# Patient Record
Sex: Female | Born: 1935 | Race: Black or African American | Hispanic: No | Smoking: Never smoker
Health system: Southern US, Community
[De-identification: ages and names within clinical notes are randomized; demographics above are authoritative.]

## PROBLEM LIST (undated history)

## (undated) DIAGNOSIS — D472 Monoclonal gammopathy: Secondary | ICD-10-CM

## (undated) DIAGNOSIS — E079 Disorder of thyroid, unspecified: Secondary | ICD-10-CM

## (undated) DIAGNOSIS — I639 Cerebral infarction, unspecified: Secondary | ICD-10-CM

## (undated) DIAGNOSIS — IMO0001 Reserved for inherently not codable concepts without codable children: Secondary | ICD-10-CM

## (undated) DIAGNOSIS — M869 Osteomyelitis, unspecified: Secondary | ICD-10-CM

## (undated) DIAGNOSIS — Z5189 Encounter for other specified aftercare: Secondary | ICD-10-CM

## (undated) DIAGNOSIS — I872 Venous insufficiency (chronic) (peripheral): Secondary | ICD-10-CM

## (undated) DIAGNOSIS — D649 Anemia, unspecified: Secondary | ICD-10-CM

## (undated) DIAGNOSIS — E039 Hypothyroidism, unspecified: Secondary | ICD-10-CM

## (undated) DIAGNOSIS — E785 Hyperlipidemia, unspecified: Secondary | ICD-10-CM

## (undated) DIAGNOSIS — I10 Essential (primary) hypertension: Secondary | ICD-10-CM

## (undated) DIAGNOSIS — I4891 Unspecified atrial fibrillation: Secondary | ICD-10-CM

## (undated) DIAGNOSIS — N189 Chronic kidney disease, unspecified: Secondary | ICD-10-CM

## (undated) HISTORY — DX: Chronic kidney disease, unspecified: N18.9

## (undated) HISTORY — DX: Hyperlipidemia, unspecified: E78.5

## (undated) HISTORY — PX: EYE SURGERY: SHX253

## (undated) HISTORY — DX: Disorder of thyroid, unspecified: E07.9

## (undated) HISTORY — DX: Venous insufficiency (chronic) (peripheral): I87.2

## (undated) HISTORY — DX: Essential (primary) hypertension: I10

## (undated) HISTORY — PX: APPENDECTOMY: SHX54

## (undated) HISTORY — DX: Osteomyelitis, unspecified: M86.9

## (undated) HISTORY — DX: Unspecified atrial fibrillation: I48.91

## (undated) HISTORY — DX: Anemia, unspecified: D64.9

## (undated) HISTORY — DX: Cerebral infarction, unspecified: I63.9

---

## 1997-09-06 ENCOUNTER — Encounter: Admission: RE | Admit: 1997-09-06 | Discharge: 1997-09-06 | Payer: Self-pay | Admitting: Internal Medicine

## 1997-09-11 ENCOUNTER — Encounter: Admission: RE | Admit: 1997-09-11 | Discharge: 1997-09-11 | Payer: Self-pay | Admitting: Internal Medicine

## 1997-10-16 ENCOUNTER — Encounter: Admission: RE | Admit: 1997-10-16 | Discharge: 1997-10-16 | Payer: Self-pay | Admitting: Internal Medicine

## 1997-11-13 ENCOUNTER — Encounter: Admission: RE | Admit: 1997-11-13 | Discharge: 1997-11-13 | Payer: Self-pay | Admitting: Internal Medicine

## 1997-11-30 ENCOUNTER — Inpatient Hospital Stay (HOSPITAL_COMMUNITY): Admission: RE | Admit: 1997-11-30 | Discharge: 1997-12-01 | Payer: Self-pay | Admitting: Hematology and Oncology

## 1997-12-08 ENCOUNTER — Encounter: Admission: RE | Admit: 1997-12-08 | Discharge: 1997-12-08 | Payer: Self-pay | Admitting: Internal Medicine

## 1998-01-12 ENCOUNTER — Encounter: Admission: RE | Admit: 1998-01-12 | Discharge: 1998-01-12 | Payer: Self-pay | Admitting: Internal Medicine

## 1998-04-23 ENCOUNTER — Ambulatory Visit (HOSPITAL_COMMUNITY): Admission: RE | Admit: 1998-04-23 | Discharge: 1998-04-23 | Payer: Self-pay | Admitting: Internal Medicine

## 1998-04-23 ENCOUNTER — Encounter: Admission: RE | Admit: 1998-04-23 | Discharge: 1998-04-23 | Payer: Self-pay | Admitting: Internal Medicine

## 1998-04-26 ENCOUNTER — Encounter: Payer: Self-pay | Admitting: Hematology and Oncology

## 1998-04-26 ENCOUNTER — Encounter: Admission: RE | Admit: 1998-04-26 | Discharge: 1998-04-26 | Payer: Self-pay | Admitting: Hematology and Oncology

## 1998-04-26 ENCOUNTER — Inpatient Hospital Stay (HOSPITAL_COMMUNITY): Admission: AD | Admit: 1998-04-26 | Discharge: 1998-05-02 | Payer: Self-pay | Admitting: Hematology and Oncology

## 1998-04-28 ENCOUNTER — Encounter: Payer: Self-pay | Admitting: Internal Medicine

## 1998-05-01 ENCOUNTER — Encounter: Payer: Self-pay | Admitting: *Deleted

## 1998-05-07 ENCOUNTER — Encounter: Admission: RE | Admit: 1998-05-07 | Discharge: 1998-05-07 | Payer: Self-pay | Admitting: Internal Medicine

## 1998-05-14 ENCOUNTER — Encounter: Admission: RE | Admit: 1998-05-14 | Discharge: 1998-05-14 | Payer: Self-pay | Admitting: Internal Medicine

## 1998-05-21 ENCOUNTER — Encounter: Admission: RE | Admit: 1998-05-21 | Discharge: 1998-05-21 | Payer: Self-pay | Admitting: Internal Medicine

## 1998-05-30 ENCOUNTER — Encounter: Admission: RE | Admit: 1998-05-30 | Discharge: 1998-05-30 | Payer: Self-pay | Admitting: Internal Medicine

## 1998-07-12 ENCOUNTER — Ambulatory Visit (HOSPITAL_COMMUNITY): Admission: RE | Admit: 1998-07-12 | Discharge: 1998-07-12 | Payer: Self-pay | Admitting: *Deleted

## 1998-07-24 ENCOUNTER — Encounter: Admission: RE | Admit: 1998-07-24 | Discharge: 1998-07-24 | Payer: Self-pay | Admitting: Hematology and Oncology

## 1998-09-25 ENCOUNTER — Encounter: Admission: RE | Admit: 1998-09-25 | Discharge: 1998-09-25 | Payer: Self-pay | Admitting: *Deleted

## 1998-10-26 ENCOUNTER — Encounter: Admission: RE | Admit: 1998-10-26 | Discharge: 1998-10-26 | Payer: Self-pay | Admitting: Internal Medicine

## 1998-10-30 ENCOUNTER — Encounter: Admission: RE | Admit: 1998-10-30 | Discharge: 1998-10-30 | Payer: Self-pay | Admitting: Hematology and Oncology

## 1998-11-09 ENCOUNTER — Ambulatory Visit (HOSPITAL_COMMUNITY): Admission: RE | Admit: 1998-11-09 | Discharge: 1998-11-09 | Payer: Self-pay | Admitting: Internal Medicine

## 1998-11-09 ENCOUNTER — Encounter: Admission: RE | Admit: 1998-11-09 | Discharge: 1998-11-09 | Payer: Self-pay | Admitting: Internal Medicine

## 1998-11-12 ENCOUNTER — Encounter: Admission: RE | Admit: 1998-11-12 | Discharge: 1998-11-12 | Payer: Self-pay | Admitting: Internal Medicine

## 1998-11-13 ENCOUNTER — Encounter: Admission: RE | Admit: 1998-11-13 | Discharge: 1999-02-11 | Payer: Self-pay | Admitting: Internal Medicine

## 1998-12-12 ENCOUNTER — Encounter: Payer: Self-pay | Admitting: Orthopedic Surgery

## 1998-12-19 ENCOUNTER — Encounter (INDEPENDENT_AMBULATORY_CARE_PROVIDER_SITE_OTHER): Payer: Self-pay | Admitting: Specialist

## 1998-12-19 ENCOUNTER — Other Ambulatory Visit: Admission: RE | Admit: 1998-12-19 | Discharge: 1998-12-19 | Payer: Self-pay | Admitting: Gastroenterology

## 1998-12-28 ENCOUNTER — Ambulatory Visit (HOSPITAL_COMMUNITY): Admission: RE | Admit: 1998-12-28 | Discharge: 1998-12-28 | Payer: Self-pay | Admitting: Orthopedic Surgery

## 1998-12-28 ENCOUNTER — Encounter: Payer: Self-pay | Admitting: Orthopedic Surgery

## 1999-01-17 ENCOUNTER — Encounter: Admission: RE | Admit: 1999-01-17 | Discharge: 1999-01-17 | Payer: Self-pay | Admitting: Internal Medicine

## 1999-01-24 ENCOUNTER — Encounter: Payer: Self-pay | Admitting: Orthopedic Surgery

## 1999-01-29 ENCOUNTER — Encounter (INDEPENDENT_AMBULATORY_CARE_PROVIDER_SITE_OTHER): Payer: Self-pay | Admitting: Specialist

## 1999-01-29 ENCOUNTER — Observation Stay (HOSPITAL_COMMUNITY): Admission: RE | Admit: 1999-01-29 | Discharge: 1999-01-30 | Payer: Self-pay | Admitting: Orthopedic Surgery

## 1999-02-19 ENCOUNTER — Encounter: Admission: RE | Admit: 1999-02-19 | Discharge: 1999-05-20 | Payer: Self-pay | Admitting: Orthopedic Surgery

## 1999-03-18 ENCOUNTER — Encounter: Admission: RE | Admit: 1999-03-18 | Discharge: 1999-03-18 | Payer: Self-pay | Admitting: Internal Medicine

## 1999-05-03 ENCOUNTER — Encounter: Admission: RE | Admit: 1999-05-03 | Discharge: 1999-05-03 | Payer: Self-pay | Admitting: Internal Medicine

## 1999-05-28 ENCOUNTER — Ambulatory Visit (HOSPITAL_COMMUNITY): Admission: RE | Admit: 1999-05-28 | Discharge: 1999-05-28 | Payer: Self-pay | Admitting: *Deleted

## 1999-06-17 ENCOUNTER — Encounter: Admission: RE | Admit: 1999-06-17 | Discharge: 1999-06-17 | Payer: Self-pay | Admitting: Internal Medicine

## 1999-07-22 ENCOUNTER — Encounter: Admission: RE | Admit: 1999-07-22 | Discharge: 1999-07-22 | Payer: Self-pay | Admitting: Internal Medicine

## 1999-07-29 ENCOUNTER — Encounter: Admission: RE | Admit: 1999-07-29 | Discharge: 1999-10-27 | Payer: Self-pay | Admitting: Internal Medicine

## 1999-11-11 ENCOUNTER — Encounter: Admission: RE | Admit: 1999-11-11 | Discharge: 2000-02-09 | Payer: Self-pay | Admitting: Orthopedic Surgery

## 1999-12-17 ENCOUNTER — Encounter: Payer: Self-pay | Admitting: Obstetrics

## 1999-12-17 ENCOUNTER — Ambulatory Visit (HOSPITAL_COMMUNITY): Admission: RE | Admit: 1999-12-17 | Discharge: 1999-12-17 | Payer: Self-pay | Admitting: Obstetrics

## 2000-03-09 ENCOUNTER — Encounter: Admission: RE | Admit: 2000-03-09 | Discharge: 2000-03-09 | Payer: Self-pay | Admitting: Internal Medicine

## 2000-08-20 ENCOUNTER — Encounter: Admission: RE | Admit: 2000-08-20 | Discharge: 2000-08-20 | Payer: Self-pay | Admitting: Internal Medicine

## 2000-08-26 ENCOUNTER — Encounter: Admission: RE | Admit: 2000-08-26 | Discharge: 2000-08-26 | Payer: Self-pay

## 2000-08-28 ENCOUNTER — Encounter: Admission: RE | Admit: 2000-08-28 | Discharge: 2000-08-28 | Payer: Self-pay

## 2000-09-01 ENCOUNTER — Encounter: Admission: RE | Admit: 2000-09-01 | Discharge: 2000-09-01 | Payer: Self-pay | Admitting: Internal Medicine

## 2000-10-07 ENCOUNTER — Encounter: Admission: RE | Admit: 2000-10-07 | Discharge: 2000-10-07 | Payer: Self-pay | Admitting: Internal Medicine

## 2000-10-13 ENCOUNTER — Encounter: Payer: Self-pay | Admitting: *Deleted

## 2000-10-13 ENCOUNTER — Encounter: Admission: RE | Admit: 2000-10-13 | Discharge: 2000-10-13 | Payer: Self-pay | Admitting: *Deleted

## 2001-01-26 ENCOUNTER — Encounter: Admission: RE | Admit: 2001-01-26 | Discharge: 2001-01-26 | Payer: Self-pay | Admitting: Internal Medicine

## 2001-02-26 ENCOUNTER — Encounter: Admission: RE | Admit: 2001-02-26 | Discharge: 2001-05-27 | Payer: Self-pay | Admitting: *Deleted

## 2001-04-20 ENCOUNTER — Encounter: Admission: RE | Admit: 2001-04-20 | Discharge: 2001-04-20 | Payer: Self-pay | Admitting: *Deleted

## 2001-05-06 ENCOUNTER — Encounter: Admission: RE | Admit: 2001-05-06 | Discharge: 2001-05-06 | Payer: Self-pay | Admitting: Internal Medicine

## 2001-05-17 ENCOUNTER — Encounter: Admission: RE | Admit: 2001-05-17 | Discharge: 2001-05-17 | Payer: Self-pay | Admitting: *Deleted

## 2001-06-02 ENCOUNTER — Ambulatory Visit (HOSPITAL_COMMUNITY): Admission: RE | Admit: 2001-06-02 | Discharge: 2001-06-02 | Payer: Self-pay | Admitting: *Deleted

## 2001-08-04 ENCOUNTER — Encounter: Admission: RE | Admit: 2001-08-04 | Discharge: 2001-08-04 | Payer: Self-pay | Admitting: Internal Medicine

## 2001-11-09 ENCOUNTER — Encounter: Admission: RE | Admit: 2001-11-09 | Discharge: 2001-11-09 | Payer: Self-pay | Admitting: Internal Medicine

## 2001-12-13 ENCOUNTER — Encounter: Admission: RE | Admit: 2001-12-13 | Discharge: 2001-12-13 | Payer: Self-pay | Admitting: Internal Medicine

## 2001-12-21 ENCOUNTER — Ambulatory Visit (HOSPITAL_COMMUNITY): Admission: RE | Admit: 2001-12-21 | Discharge: 2001-12-21 | Payer: Self-pay | Admitting: Internal Medicine

## 2002-03-08 ENCOUNTER — Encounter: Payer: Self-pay | Admitting: Internal Medicine

## 2002-03-08 ENCOUNTER — Encounter: Admission: RE | Admit: 2002-03-08 | Discharge: 2002-03-08 | Payer: Self-pay | Admitting: Internal Medicine

## 2002-03-08 ENCOUNTER — Ambulatory Visit (HOSPITAL_COMMUNITY): Admission: RE | Admit: 2002-03-08 | Discharge: 2002-03-08 | Payer: Self-pay | Admitting: Internal Medicine

## 2002-12-30 ENCOUNTER — Encounter: Admission: RE | Admit: 2002-12-30 | Discharge: 2002-12-30 | Payer: Self-pay | Admitting: Internal Medicine

## 2003-03-30 ENCOUNTER — Encounter: Admission: RE | Admit: 2003-03-30 | Discharge: 2003-03-30 | Payer: Self-pay | Admitting: Internal Medicine

## 2003-05-23 ENCOUNTER — Encounter: Admission: RE | Admit: 2003-05-23 | Discharge: 2003-05-23 | Payer: Self-pay | Admitting: Internal Medicine

## 2003-07-12 ENCOUNTER — Encounter: Admission: RE | Admit: 2003-07-12 | Discharge: 2003-07-12 | Payer: Self-pay | Admitting: Internal Medicine

## 2003-08-31 ENCOUNTER — Encounter: Admission: RE | Admit: 2003-08-31 | Discharge: 2003-08-31 | Payer: Self-pay | Admitting: Internal Medicine

## 2003-09-12 ENCOUNTER — Ambulatory Visit (HOSPITAL_COMMUNITY): Admission: RE | Admit: 2003-09-12 | Discharge: 2003-09-12 | Payer: Self-pay

## 2003-09-21 ENCOUNTER — Encounter: Admission: RE | Admit: 2003-09-21 | Discharge: 2003-09-21 | Payer: Self-pay | Admitting: Internal Medicine

## 2004-03-12 ENCOUNTER — Ambulatory Visit: Payer: Self-pay | Admitting: Internal Medicine

## 2004-03-19 ENCOUNTER — Ambulatory Visit: Payer: Self-pay | Admitting: Oncology

## 2004-04-18 ENCOUNTER — Ambulatory Visit: Payer: Self-pay | Admitting: Internal Medicine

## 2004-04-19 ENCOUNTER — Encounter (INDEPENDENT_AMBULATORY_CARE_PROVIDER_SITE_OTHER): Payer: Self-pay | Admitting: Specialist

## 2004-04-19 ENCOUNTER — Ambulatory Visit (HOSPITAL_COMMUNITY): Admission: RE | Admit: 2004-04-19 | Discharge: 2004-04-19 | Payer: Self-pay | Admitting: Oncology

## 2004-04-19 ENCOUNTER — Ambulatory Visit: Payer: Self-pay | Admitting: Oncology

## 2004-04-20 ENCOUNTER — Ambulatory Visit (HOSPITAL_COMMUNITY): Admission: RE | Admit: 2004-04-20 | Discharge: 2004-04-21 | Payer: Self-pay | Admitting: Oncology

## 2004-05-07 ENCOUNTER — Ambulatory Visit: Payer: Self-pay | Admitting: Oncology

## 2004-06-25 ENCOUNTER — Ambulatory Visit: Payer: Self-pay | Admitting: Oncology

## 2004-07-02 ENCOUNTER — Ambulatory Visit: Payer: Self-pay | Admitting: Internal Medicine

## 2004-08-16 ENCOUNTER — Ambulatory Visit: Payer: Self-pay | Admitting: Oncology

## 2004-09-24 ENCOUNTER — Ambulatory Visit: Payer: Self-pay | Admitting: Internal Medicine

## 2004-10-04 ENCOUNTER — Ambulatory Visit: Payer: Self-pay | Admitting: Oncology

## 2004-10-04 ENCOUNTER — Encounter (INDEPENDENT_AMBULATORY_CARE_PROVIDER_SITE_OTHER): Payer: Self-pay | Admitting: Internal Medicine

## 2004-10-16 ENCOUNTER — Ambulatory Visit: Payer: Self-pay | Admitting: Internal Medicine

## 2004-11-29 ENCOUNTER — Ambulatory Visit: Payer: Self-pay | Admitting: Oncology

## 2004-12-12 ENCOUNTER — Ambulatory Visit: Payer: Self-pay | Admitting: Internal Medicine

## 2004-12-12 ENCOUNTER — Inpatient Hospital Stay (HOSPITAL_COMMUNITY): Admission: AD | Admit: 2004-12-12 | Discharge: 2004-12-26 | Payer: Self-pay | Admitting: Internal Medicine

## 2004-12-12 ENCOUNTER — Ambulatory Visit: Payer: Self-pay | Admitting: Oncology

## 2004-12-13 ENCOUNTER — Ambulatory Visit: Payer: Self-pay | Admitting: Cardiology

## 2004-12-13 ENCOUNTER — Encounter: Payer: Self-pay | Admitting: Cardiology

## 2004-12-13 ENCOUNTER — Ambulatory Visit: Payer: Self-pay | Admitting: Internal Medicine

## 2004-12-19 ENCOUNTER — Ambulatory Visit: Payer: Self-pay | Admitting: Cardiology

## 2004-12-19 ENCOUNTER — Encounter: Payer: Self-pay | Admitting: Cardiology

## 2004-12-24 ENCOUNTER — Encounter (INDEPENDENT_AMBULATORY_CARE_PROVIDER_SITE_OTHER): Payer: Self-pay | Admitting: *Deleted

## 2004-12-25 ENCOUNTER — Encounter (INDEPENDENT_AMBULATORY_CARE_PROVIDER_SITE_OTHER): Payer: Self-pay | Admitting: *Deleted

## 2005-01-23 ENCOUNTER — Ambulatory Visit: Payer: Self-pay | Admitting: Oncology

## 2005-01-31 ENCOUNTER — Inpatient Hospital Stay (HOSPITAL_COMMUNITY): Admission: RE | Admit: 2005-01-31 | Discharge: 2005-02-07 | Payer: Self-pay | Admitting: Internal Medicine

## 2005-01-31 ENCOUNTER — Ambulatory Visit: Payer: Self-pay | Admitting: Internal Medicine

## 2005-02-01 ENCOUNTER — Ambulatory Visit: Payer: Self-pay | Admitting: Internal Medicine

## 2005-02-05 ENCOUNTER — Encounter (INDEPENDENT_AMBULATORY_CARE_PROVIDER_SITE_OTHER): Payer: Self-pay | Admitting: Specialist

## 2005-02-10 ENCOUNTER — Ambulatory Visit: Payer: Self-pay | Admitting: Internal Medicine

## 2005-02-12 ENCOUNTER — Encounter (HOSPITAL_COMMUNITY): Admission: RE | Admit: 2005-02-12 | Discharge: 2005-05-13 | Payer: Self-pay | Admitting: Internal Medicine

## 2005-02-13 ENCOUNTER — Ambulatory Visit: Payer: Self-pay | Admitting: Internal Medicine

## 2005-02-14 ENCOUNTER — Ambulatory Visit: Payer: Self-pay | Admitting: Internal Medicine

## 2005-02-20 ENCOUNTER — Ambulatory Visit: Payer: Self-pay | Admitting: Internal Medicine

## 2005-02-24 ENCOUNTER — Ambulatory Visit: Payer: Self-pay | Admitting: Hospitalist

## 2005-02-26 ENCOUNTER — Ambulatory Visit: Payer: Self-pay | Admitting: Internal Medicine

## 2005-03-20 ENCOUNTER — Ambulatory Visit: Payer: Self-pay | Admitting: Oncology

## 2005-03-24 ENCOUNTER — Ambulatory Visit: Payer: Self-pay | Admitting: Internal Medicine

## 2005-04-04 ENCOUNTER — Ambulatory Visit: Payer: Self-pay | Admitting: Internal Medicine

## 2005-04-14 ENCOUNTER — Inpatient Hospital Stay (HOSPITAL_COMMUNITY): Admission: AD | Admit: 2005-04-14 | Discharge: 2005-05-07 | Payer: Self-pay | Admitting: Internal Medicine

## 2005-04-14 ENCOUNTER — Ambulatory Visit: Payer: Self-pay | Admitting: Physical Medicine & Rehabilitation

## 2005-04-14 ENCOUNTER — Ambulatory Visit: Payer: Self-pay | Admitting: Internal Medicine

## 2005-04-19 ENCOUNTER — Ambulatory Visit: Payer: Self-pay | Admitting: Pulmonary Disease

## 2005-04-24 ENCOUNTER — Encounter (INDEPENDENT_AMBULATORY_CARE_PROVIDER_SITE_OTHER): Payer: Self-pay | Admitting: Specialist

## 2005-05-06 ENCOUNTER — Encounter (INDEPENDENT_AMBULATORY_CARE_PROVIDER_SITE_OTHER): Payer: Self-pay | Admitting: Cardiology

## 2005-05-07 ENCOUNTER — Ambulatory Visit: Payer: Self-pay | Admitting: Physical Medicine & Rehabilitation

## 2005-05-07 ENCOUNTER — Inpatient Hospital Stay
Admission: RE | Admit: 2005-05-07 | Discharge: 2005-05-22 | Payer: Self-pay | Admitting: Physical Medicine & Rehabilitation

## 2005-05-09 ENCOUNTER — Encounter (HOSPITAL_COMMUNITY)
Admission: RE | Admit: 2005-05-09 | Discharge: 2005-05-11 | Payer: Self-pay | Admitting: Physical Medicine & Rehabilitation

## 2005-05-16 ENCOUNTER — Ambulatory Visit (HOSPITAL_COMMUNITY): Admission: RE | Admit: 2005-05-16 | Discharge: 2005-05-16 | Payer: Self-pay | Admitting: Internal Medicine

## 2005-05-20 ENCOUNTER — Ambulatory Visit (HOSPITAL_COMMUNITY)
Admission: RE | Admit: 2005-05-20 | Discharge: 2005-05-20 | Payer: Self-pay | Admitting: Physical Medicine & Rehabilitation

## 2005-05-29 ENCOUNTER — Ambulatory Visit: Payer: Self-pay | Admitting: Oncology

## 2005-07-09 ENCOUNTER — Ambulatory Visit: Payer: Self-pay

## 2005-07-14 ENCOUNTER — Ambulatory Visit: Payer: Self-pay | Admitting: Oncology

## 2005-07-23 ENCOUNTER — Ambulatory Visit: Payer: Self-pay | Admitting: Internal Medicine

## 2005-07-31 ENCOUNTER — Ambulatory Visit: Payer: Self-pay | Admitting: Internal Medicine

## 2005-08-19 ENCOUNTER — Encounter: Admission: RE | Admit: 2005-08-19 | Discharge: 2005-08-19 | Payer: Self-pay | Admitting: Internal Medicine

## 2005-08-26 LAB — CBC WITH DIFFERENTIAL/PLATELET
Basophils Absolute: 0 10*3/uL (ref 0.0–0.1)
Eosinophils Absolute: 0.1 10*3/uL (ref 0.0–0.5)
HGB: 12.3 g/dL (ref 11.6–15.9)
MONO#: 0.7 10*3/uL (ref 0.1–0.9)
MONO%: 12.5 % (ref 0.0–13.0)
NEUT#: 3.3 10*3/uL (ref 1.5–6.5)
RBC: 4.32 10*6/uL (ref 3.70–5.32)
RDW: 19.3 % — ABNORMAL HIGH (ref 11.3–14.5)
WBC: 5.2 10*3/uL (ref 3.9–10.0)
lymph#: 1.2 10*3/uL (ref 0.9–3.3)

## 2005-08-27 LAB — COMPREHENSIVE METABOLIC PANEL
Albumin: 3.8 g/dL (ref 3.5–5.2)
Alkaline Phosphatase: 183 U/L — ABNORMAL HIGH (ref 39–117)
BUN: 20 mg/dL (ref 6–23)
Calcium: 8.7 mg/dL (ref 8.4–10.5)
Chloride: 103 mEq/L (ref 96–112)
Glucose, Bld: 141 mg/dL — ABNORMAL HIGH (ref 70–99)
Potassium: 3.4 mEq/L — ABNORMAL LOW (ref 3.5–5.3)
Sodium: 145 mEq/L (ref 135–145)
Total Protein: 7 g/dL (ref 6.0–8.3)

## 2005-08-27 LAB — IRON AND TIBC
%SAT: 10 % — ABNORMAL LOW (ref 20–55)
Iron: 22 ug/dL — ABNORMAL LOW (ref 42–145)

## 2005-08-27 LAB — FERRITIN: Ferritin: 312 ng/mL — ABNORMAL HIGH (ref 10–291)

## 2005-08-27 LAB — IGG, IGA, IGM: IgA: 679 mg/dL — ABNORMAL HIGH (ref 68–378)

## 2005-08-29 ENCOUNTER — Ambulatory Visit: Payer: Self-pay | Admitting: Internal Medicine

## 2005-09-05 ENCOUNTER — Ambulatory Visit: Payer: Self-pay | Admitting: Oncology

## 2005-09-05 LAB — CBC WITH DIFFERENTIAL/PLATELET
Eosinophils Absolute: 0.1 10*3/uL (ref 0.0–0.5)
LYMPH%: 19.1 % (ref 14.0–48.0)
MONO#: 0.7 10*3/uL (ref 0.1–0.9)
NEUT#: 3.7 10*3/uL (ref 1.5–6.5)
Platelets: 164 10*3/uL (ref 145–400)
RBC: 4.35 10*6/uL (ref 3.70–5.32)
WBC: 5.6 10*3/uL (ref 3.9–10.0)

## 2005-09-10 ENCOUNTER — Ambulatory Visit: Payer: Self-pay | Admitting: Internal Medicine

## 2005-09-15 ENCOUNTER — Ambulatory Visit: Payer: Self-pay | Admitting: Cardiovascular Disease

## 2005-09-15 ENCOUNTER — Encounter (INDEPENDENT_AMBULATORY_CARE_PROVIDER_SITE_OTHER): Payer: Self-pay | Admitting: Internal Medicine

## 2005-09-19 LAB — CBC WITH DIFFERENTIAL/PLATELET
Eosinophils Absolute: 0.2 10*3/uL (ref 0.0–0.5)
MCV: 87.2 fL (ref 81.0–101.0)
MONO%: 10.2 % (ref 0.0–13.0)
NEUT#: 2.7 10*3/uL (ref 1.5–6.5)
RBC: 4.23 10*6/uL (ref 3.70–5.32)
RDW: 18.2 % — ABNORMAL HIGH (ref 11.3–14.5)
WBC: 4.3 10*3/uL (ref 3.9–10.0)

## 2005-10-03 LAB — COMPREHENSIVE METABOLIC PANEL
ALT: 18 U/L (ref 0–40)
AST: 25 U/L (ref 0–37)
Albumin: 3.5 g/dL (ref 3.5–5.2)
CO2: 25 mEq/L (ref 19–32)
Calcium: 9.1 mg/dL (ref 8.4–10.5)
Chloride: 100 mEq/L (ref 96–112)
Creatinine, Ser: 1.4 mg/dL — ABNORMAL HIGH (ref 0.4–1.2)
Potassium: 4.5 mEq/L (ref 3.5–5.3)
Total Protein: 7 g/dL (ref 6.0–8.3)

## 2005-10-03 LAB — IGG, IGA, IGM
IgA: 652 mg/dL — ABNORMAL HIGH (ref 68–378)
IgG (Immunoglobin G), Serum: 1380 mg/dL (ref 694–1618)
IgM, Serum: 98 mg/dL (ref 60–263)

## 2005-10-03 LAB — CBC WITH DIFFERENTIAL/PLATELET
BASO%: 0.2 % (ref 0.0–2.0)
HCT: 36.4 % (ref 34.8–46.6)
HGB: 11.7 g/dL (ref 11.6–15.9)
MCHC: 32.1 g/dL (ref 32.0–36.0)
MONO#: 0.5 10*3/uL (ref 0.1–0.9)
NEUT%: 67.4 % (ref 39.6–76.8)
RDW: 17.6 % — ABNORMAL HIGH (ref 11.3–14.5)
WBC: 4.3 10*3/uL (ref 3.9–10.0)
lymph#: 0.9 10*3/uL (ref 0.9–3.3)

## 2005-10-03 LAB — LACTATE DEHYDROGENASE: LDH: 159 U/L (ref 94–250)

## 2005-10-03 LAB — IRON AND TIBC: TIBC: 231 ug/dL — ABNORMAL LOW (ref 250–470)

## 2005-10-17 ENCOUNTER — Ambulatory Visit: Payer: Self-pay | Admitting: Oncology

## 2005-10-17 LAB — CBC WITH DIFFERENTIAL/PLATELET
Basophils Absolute: 0 10*3/uL (ref 0.0–0.1)
EOS%: 3.4 % (ref 0.0–7.0)
HCT: 34.3 % — ABNORMAL LOW (ref 34.8–46.6)
HGB: 11.1 g/dL — ABNORMAL LOW (ref 11.6–15.9)
LYMPH%: 21.6 % (ref 14.0–48.0)
MCH: 27.6 pg (ref 26.0–34.0)
MONO#: 0.5 10*3/uL (ref 0.1–0.9)
NEUT%: 61.4 % (ref 39.6–76.8)
Platelets: 141 10*3/uL — ABNORMAL LOW (ref 145–400)
lymph#: 0.8 10*3/uL — ABNORMAL LOW (ref 0.9–3.3)

## 2005-10-31 LAB — CBC WITH DIFFERENTIAL/PLATELET
Basophils Absolute: 0 10*3/uL (ref 0.0–0.1)
EOS%: 2.8 % (ref 0.0–7.0)
HCT: 37.5 % (ref 34.8–46.6)
HGB: 12.1 g/dL (ref 11.6–15.9)
LYMPH%: 22.8 % (ref 14.0–48.0)
MCH: 28.1 pg (ref 26.0–34.0)
MCV: 86.8 fL (ref 81.0–101.0)
MONO%: 12.5 % (ref 0.0–13.0)
NEUT%: 61.8 % (ref 39.6–76.8)

## 2005-11-13 LAB — CBC WITH DIFFERENTIAL/PLATELET
BASO%: 0 % (ref 0.0–2.0)
Basophils Absolute: 0 10*3/uL (ref 0.0–0.1)
EOS%: 3.3 % (ref 0.0–7.0)
MCH: 28.2 pg (ref 26.0–34.0)
MCHC: 32.6 g/dL (ref 32.0–36.0)
MCV: 86.5 fL (ref 81.0–101.0)
MONO%: 10.7 % (ref 0.0–13.0)
RBC: 4.35 10*6/uL (ref 3.70–5.32)
RDW: 19 % — ABNORMAL HIGH (ref 11.3–14.5)

## 2005-11-21 ENCOUNTER — Ambulatory Visit: Payer: Self-pay

## 2005-12-03 ENCOUNTER — Ambulatory Visit: Payer: Self-pay | Admitting: Oncology

## 2005-12-04 ENCOUNTER — Encounter: Admission: RE | Admit: 2005-12-04 | Discharge: 2005-12-04 | Payer: Self-pay | Admitting: Internal Medicine

## 2005-12-08 LAB — COMPREHENSIVE METABOLIC PANEL
ALT: 32 U/L (ref 0–40)
Alkaline Phosphatase: 199 U/L — ABNORMAL HIGH (ref 39–117)
Creatinine, Ser: 1.22 mg/dL — ABNORMAL HIGH (ref 0.40–1.20)
Sodium: 141 mEq/L (ref 135–145)
Total Bilirubin: 0.9 mg/dL (ref 0.3–1.2)
Total Protein: 6.8 g/dL (ref 6.0–8.3)

## 2005-12-08 LAB — CBC WITH DIFFERENTIAL/PLATELET
BASO%: 1.2 % (ref 0.0–2.0)
LYMPH%: 20.3 % (ref 14.0–48.0)
MCH: 28.4 pg (ref 26.0–34.0)
MCHC: 32.9 g/dL (ref 32.0–36.0)
MCV: 86.5 fL (ref 81.0–101.0)
MONO%: 10.4 % (ref 0.0–13.0)
NEUT%: 62 % (ref 39.6–76.8)
Platelets: 144 10*3/uL — ABNORMAL LOW (ref 145–400)
RBC: 3.9 10*6/uL (ref 3.70–5.32)

## 2005-12-08 LAB — IGG, IGA, IGM
IgG (Immunoglobin G), Serum: 1190 mg/dL (ref 694–1618)
IgM, Serum: 90 mg/dL (ref 60–263)

## 2005-12-08 LAB — IRON AND TIBC: %SAT: 23 % (ref 20–55)

## 2005-12-08 LAB — FERRITIN: Ferritin: 380 ng/mL — ABNORMAL HIGH (ref 10–291)

## 2005-12-12 ENCOUNTER — Ambulatory Visit: Payer: Self-pay | Admitting: Internal Medicine

## 2005-12-22 LAB — CBC WITH DIFFERENTIAL/PLATELET
BASO%: 0.1 % (ref 0.0–2.0)
EOS%: 6.9 % (ref 0.0–7.0)
MCH: 29 pg (ref 26.0–34.0)
MCV: 89.1 fL (ref 81.0–101.0)
MONO%: 11.4 % (ref 0.0–13.0)
NEUT#: 3.8 10*3/uL (ref 1.5–6.5)
RBC: 4.26 10*6/uL (ref 3.70–5.32)
RDW: 19.8 % — ABNORMAL HIGH (ref 11.3–14.5)

## 2005-12-23 ENCOUNTER — Ambulatory Visit: Payer: Self-pay | Admitting: Internal Medicine

## 2005-12-26 ENCOUNTER — Ambulatory Visit: Payer: Self-pay | Admitting: Internal Medicine

## 2006-01-08 LAB — CBC WITH DIFFERENTIAL/PLATELET
Eosinophils Absolute: 0.3 10*3/uL (ref 0.0–0.5)
MONO#: 0.9 10*3/uL (ref 0.1–0.9)
MONO%: 13.3 % — ABNORMAL HIGH (ref 0.0–13.0)
NEUT#: 4.4 10*3/uL (ref 1.5–6.5)
RBC: 5.22 10*6/uL (ref 3.70–5.32)
RDW: 19.1 % — ABNORMAL HIGH (ref 11.3–14.5)
WBC: 6.6 10*3/uL (ref 3.9–10.0)
lymph#: 1 10*3/uL (ref 0.9–3.3)

## 2006-01-15 ENCOUNTER — Ambulatory Visit: Payer: Self-pay | Admitting: Oncology

## 2006-01-16 ENCOUNTER — Ambulatory Visit: Payer: Self-pay | Admitting: Hospitalist

## 2006-01-26 ENCOUNTER — Ambulatory Visit: Payer: Self-pay | Admitting: Internal Medicine

## 2006-02-02 ENCOUNTER — Ambulatory Visit: Payer: Self-pay | Admitting: Hospitalist

## 2006-02-02 LAB — CBC WITH DIFFERENTIAL/PLATELET
Basophils Absolute: 0 10*3/uL (ref 0.0–0.1)
Eosinophils Absolute: 0.3 10*3/uL (ref 0.0–0.5)
HCT: 44.7 % (ref 34.8–46.6)
LYMPH%: 17.6 % (ref 14.0–48.0)
MCV: 89.1 fL (ref 81.0–101.0)
MONO%: 10.7 % (ref 0.0–13.0)
NEUT#: 4.6 10*3/uL (ref 1.5–6.5)
NEUT%: 67.6 % (ref 39.6–76.8)
Platelets: 135 10*3/uL — ABNORMAL LOW (ref 145–400)
RBC: 5.01 10*6/uL (ref 3.70–5.32)

## 2006-02-12 ENCOUNTER — Other Ambulatory Visit: Admission: RE | Admit: 2006-02-12 | Discharge: 2006-02-12 | Payer: Self-pay | Admitting: *Deleted

## 2006-02-12 ENCOUNTER — Encounter (INDEPENDENT_AMBULATORY_CARE_PROVIDER_SITE_OTHER): Payer: Self-pay | Admitting: Specialist

## 2006-02-12 ENCOUNTER — Ambulatory Visit: Payer: Self-pay | Admitting: Internal Medicine

## 2006-02-16 ENCOUNTER — Ambulatory Visit: Payer: Self-pay | Admitting: Hospitalist

## 2006-02-16 LAB — CBC WITH DIFFERENTIAL/PLATELET
BASO%: 0.4 % (ref 0.0–2.0)
Basophils Absolute: 0 10*3/uL (ref 0.0–0.1)
EOS%: 4.6 % (ref 0.0–7.0)
HGB: 14.4 g/dL (ref 11.6–15.9)
MCH: 29.4 pg (ref 26.0–34.0)
MCHC: 33.1 g/dL (ref 32.0–36.0)
MCV: 88.9 fL (ref 81.0–101.0)
MONO%: 12.2 % (ref 0.0–13.0)
NEUT%: 63.9 % (ref 39.6–76.8)
RDW: 15.4 % — ABNORMAL HIGH (ref 11.3–14.5)

## 2006-02-23 ENCOUNTER — Ambulatory Visit: Payer: Self-pay | Admitting: Internal Medicine

## 2006-02-26 ENCOUNTER — Ambulatory Visit: Payer: Self-pay | Admitting: Oncology

## 2006-03-02 DIAGNOSIS — R079 Chest pain, unspecified: Secondary | ICD-10-CM | POA: Insufficient documentation

## 2006-03-02 DIAGNOSIS — I10 Essential (primary) hypertension: Secondary | ICD-10-CM

## 2006-03-02 DIAGNOSIS — Z89429 Acquired absence of other toe(s), unspecified side: Secondary | ICD-10-CM

## 2006-03-02 DIAGNOSIS — I872 Venous insufficiency (chronic) (peripheral): Secondary | ICD-10-CM | POA: Insufficient documentation

## 2006-03-02 DIAGNOSIS — R609 Edema, unspecified: Secondary | ICD-10-CM

## 2006-03-02 DIAGNOSIS — E785 Hyperlipidemia, unspecified: Secondary | ICD-10-CM

## 2006-03-02 DIAGNOSIS — D518 Other vitamin B12 deficiency anemias: Secondary | ICD-10-CM

## 2006-03-09 ENCOUNTER — Ambulatory Visit: Payer: Self-pay | Admitting: Internal Medicine

## 2006-03-09 ENCOUNTER — Encounter (INDEPENDENT_AMBULATORY_CARE_PROVIDER_SITE_OTHER): Payer: Self-pay | Admitting: Internal Medicine

## 2006-03-09 LAB — CONVERTED CEMR LAB
CO2: 31 meq/L (ref 19–32)
Creatinine, Ser: 1.27 mg/dL — ABNORMAL HIGH (ref 0.40–1.20)
Glucose, Bld: 137 mg/dL — ABNORMAL HIGH (ref 70–99)
Total Bilirubin: 0.8 mg/dL (ref 0.3–1.2)

## 2006-03-13 ENCOUNTER — Encounter (INDEPENDENT_AMBULATORY_CARE_PROVIDER_SITE_OTHER): Payer: Self-pay | Admitting: Specialist

## 2006-03-13 ENCOUNTER — Inpatient Hospital Stay (HOSPITAL_COMMUNITY): Admission: RE | Admit: 2006-03-13 | Discharge: 2006-03-14 | Payer: Self-pay | Admitting: General Surgery

## 2006-05-14 ENCOUNTER — Ambulatory Visit: Payer: Self-pay | Admitting: Internal Medicine

## 2006-05-14 ENCOUNTER — Inpatient Hospital Stay (HOSPITAL_COMMUNITY): Admission: AD | Admit: 2006-05-14 | Discharge: 2006-05-17 | Payer: Self-pay | Admitting: Internal Medicine

## 2006-05-14 ENCOUNTER — Ambulatory Visit: Payer: Self-pay | Admitting: *Deleted

## 2006-05-15 ENCOUNTER — Encounter: Payer: Self-pay | Admitting: Vascular Surgery

## 2006-05-22 ENCOUNTER — Ambulatory Visit: Payer: Self-pay | Admitting: Internal Medicine

## 2006-07-03 ENCOUNTER — Ambulatory Visit: Payer: Self-pay | Admitting: Hospitalist

## 2006-07-03 ENCOUNTER — Encounter (INDEPENDENT_AMBULATORY_CARE_PROVIDER_SITE_OTHER): Payer: Self-pay | Admitting: *Deleted

## 2006-07-03 DIAGNOSIS — E1140 Type 2 diabetes mellitus with diabetic neuropathy, unspecified: Secondary | ICD-10-CM | POA: Insufficient documentation

## 2006-07-03 DIAGNOSIS — N185 Chronic kidney disease, stage 5: Secondary | ICD-10-CM | POA: Insufficient documentation

## 2006-07-03 LAB — CONVERTED CEMR LAB
Glucose, Bld: 210 mg/dL
Hgb A1c MFr Bld: 7.1 %

## 2006-07-06 ENCOUNTER — Telehealth (INDEPENDENT_AMBULATORY_CARE_PROVIDER_SITE_OTHER): Payer: Self-pay | Admitting: Hospitalist

## 2006-07-07 ENCOUNTER — Telehealth (INDEPENDENT_AMBULATORY_CARE_PROVIDER_SITE_OTHER): Payer: Self-pay | Admitting: Hospitalist

## 2006-07-17 ENCOUNTER — Encounter (INDEPENDENT_AMBULATORY_CARE_PROVIDER_SITE_OTHER): Payer: Self-pay | Admitting: Internal Medicine

## 2006-07-17 ENCOUNTER — Ambulatory Visit: Payer: Self-pay | Admitting: Internal Medicine

## 2006-07-17 LAB — CONVERTED CEMR LAB
Blood Glucose, Fingerstick: 126
Calcium: 9.8 mg/dL (ref 8.4–10.5)
Sodium: 138 meq/L (ref 135–145)

## 2006-07-20 ENCOUNTER — Telehealth: Payer: Self-pay | Admitting: *Deleted

## 2006-07-21 ENCOUNTER — Telehealth: Payer: Self-pay | Admitting: *Deleted

## 2006-07-24 ENCOUNTER — Encounter (INDEPENDENT_AMBULATORY_CARE_PROVIDER_SITE_OTHER): Payer: Self-pay | Admitting: *Deleted

## 2006-07-24 ENCOUNTER — Ambulatory Visit: Payer: Self-pay | Admitting: Internal Medicine

## 2006-07-24 LAB — CONVERTED CEMR LAB
Blood Glucose, Fingerstick: 89
Chloride: 102 meq/L (ref 96–112)
Creatinine, Ser: 1.76 mg/dL — ABNORMAL HIGH (ref 0.40–1.20)
Potassium: 4.7 meq/L (ref 3.5–5.3)

## 2006-07-28 ENCOUNTER — Encounter (INDEPENDENT_AMBULATORY_CARE_PROVIDER_SITE_OTHER): Payer: Self-pay | Admitting: Cardiology

## 2006-07-28 ENCOUNTER — Ambulatory Visit (HOSPITAL_COMMUNITY): Admission: RE | Admit: 2006-07-28 | Discharge: 2006-07-28 | Payer: Self-pay | Admitting: Internal Medicine

## 2006-08-04 ENCOUNTER — Ambulatory Visit: Payer: Self-pay | Admitting: Internal Medicine

## 2006-08-04 ENCOUNTER — Ambulatory Visit: Payer: Self-pay | Admitting: Hospitalist

## 2006-08-04 ENCOUNTER — Encounter: Admission: RE | Admit: 2006-08-04 | Discharge: 2006-08-04 | Payer: Self-pay | Admitting: Internal Medicine

## 2006-08-04 ENCOUNTER — Inpatient Hospital Stay (HOSPITAL_COMMUNITY): Admission: RE | Admit: 2006-08-04 | Discharge: 2006-08-07 | Payer: Self-pay | Admitting: Internal Medicine

## 2006-08-10 ENCOUNTER — Encounter (INDEPENDENT_AMBULATORY_CARE_PROVIDER_SITE_OTHER): Payer: Self-pay | Admitting: Internal Medicine

## 2006-08-19 ENCOUNTER — Encounter (INDEPENDENT_AMBULATORY_CARE_PROVIDER_SITE_OTHER): Payer: Self-pay | Admitting: Internal Medicine

## 2006-08-19 ENCOUNTER — Ambulatory Visit: Payer: Self-pay | Admitting: Hospitalist

## 2006-08-19 LAB — CONVERTED CEMR LAB: Blood Glucose, Fingerstick: 129

## 2006-08-21 ENCOUNTER — Encounter (HOSPITAL_BASED_OUTPATIENT_CLINIC_OR_DEPARTMENT_OTHER): Admission: RE | Admit: 2006-08-21 | Discharge: 2006-10-09 | Payer: Self-pay | Admitting: Surgery

## 2006-08-25 ENCOUNTER — Encounter (INDEPENDENT_AMBULATORY_CARE_PROVIDER_SITE_OTHER): Payer: Self-pay | Admitting: Internal Medicine

## 2006-08-26 ENCOUNTER — Encounter (INDEPENDENT_AMBULATORY_CARE_PROVIDER_SITE_OTHER): Payer: Self-pay | Admitting: Internal Medicine

## 2006-09-08 ENCOUNTER — Ambulatory Visit (HOSPITAL_COMMUNITY): Admission: RE | Admit: 2006-09-08 | Discharge: 2006-09-08 | Payer: Self-pay | Admitting: Surgery

## 2006-09-14 ENCOUNTER — Ambulatory Visit: Payer: Self-pay | Admitting: Vascular Surgery

## 2006-09-21 LAB — CONVERTED CEMR LAB
CO2: 23 meq/L (ref 19–32)
Calcium: 9.5 mg/dL (ref 8.4–10.5)
Free T4: 1.2 ng/dL (ref 0.89–1.80)
Sodium: 140 meq/L (ref 135–145)

## 2006-10-01 ENCOUNTER — Encounter (INDEPENDENT_AMBULATORY_CARE_PROVIDER_SITE_OTHER): Payer: Self-pay | Admitting: Internal Medicine

## 2006-10-09 ENCOUNTER — Encounter (HOSPITAL_BASED_OUTPATIENT_CLINIC_OR_DEPARTMENT_OTHER): Admission: RE | Admit: 2006-10-09 | Discharge: 2006-10-31 | Payer: Self-pay | Admitting: Surgery

## 2006-10-16 ENCOUNTER — Ambulatory Visit: Payer: Self-pay | Admitting: Internal Medicine

## 2006-10-16 ENCOUNTER — Encounter (INDEPENDENT_AMBULATORY_CARE_PROVIDER_SITE_OTHER): Payer: Self-pay | Admitting: Internal Medicine

## 2006-10-16 LAB — CONVERTED CEMR LAB
Blood Glucose, Fingerstick: 229
Hgb A1c MFr Bld: 8 %

## 2006-10-19 ENCOUNTER — Telehealth: Payer: Self-pay | Admitting: *Deleted

## 2006-10-19 LAB — CONVERTED CEMR LAB
Calcium: 9.4 mg/dL (ref 8.4–10.5)
Chloride: 99 meq/L (ref 96–112)
Creatinine, Ser: 2.02 mg/dL — ABNORMAL HIGH (ref 0.40–1.20)
TSH: 34.113 microintl units/mL — ABNORMAL HIGH (ref 0.350–5.50)

## 2006-10-20 ENCOUNTER — Telehealth (INDEPENDENT_AMBULATORY_CARE_PROVIDER_SITE_OTHER): Payer: Self-pay | Admitting: Internal Medicine

## 2006-11-02 ENCOUNTER — Encounter (HOSPITAL_BASED_OUTPATIENT_CLINIC_OR_DEPARTMENT_OTHER): Admission: RE | Admit: 2006-11-02 | Discharge: 2006-12-05 | Payer: Self-pay | Admitting: Surgery

## 2006-11-05 ENCOUNTER — Encounter (INDEPENDENT_AMBULATORY_CARE_PROVIDER_SITE_OTHER): Payer: Self-pay | Admitting: Internal Medicine

## 2006-11-09 ENCOUNTER — Encounter (INDEPENDENT_AMBULATORY_CARE_PROVIDER_SITE_OTHER): Payer: Self-pay | Admitting: Internal Medicine

## 2006-11-24 ENCOUNTER — Encounter (INDEPENDENT_AMBULATORY_CARE_PROVIDER_SITE_OTHER): Payer: Self-pay | Admitting: Internal Medicine

## 2006-12-07 ENCOUNTER — Encounter (HOSPITAL_BASED_OUTPATIENT_CLINIC_OR_DEPARTMENT_OTHER): Admission: RE | Admit: 2006-12-07 | Discharge: 2007-01-21 | Payer: Self-pay | Admitting: Surgery

## 2006-12-29 ENCOUNTER — Telehealth (INDEPENDENT_AMBULATORY_CARE_PROVIDER_SITE_OTHER): Payer: Self-pay | Admitting: *Deleted

## 2007-01-22 ENCOUNTER — Encounter (INDEPENDENT_AMBULATORY_CARE_PROVIDER_SITE_OTHER): Payer: Self-pay | Admitting: Internal Medicine

## 2007-01-29 ENCOUNTER — Encounter (INDEPENDENT_AMBULATORY_CARE_PROVIDER_SITE_OTHER): Payer: Self-pay | Admitting: Internal Medicine

## 2007-02-01 ENCOUNTER — Telehealth: Payer: Self-pay | Admitting: *Deleted

## 2007-02-01 DIAGNOSIS — E039 Hypothyroidism, unspecified: Secondary | ICD-10-CM

## 2007-02-03 ENCOUNTER — Ambulatory Visit: Payer: Self-pay | Admitting: Internal Medicine

## 2007-02-03 ENCOUNTER — Encounter: Payer: Self-pay | Admitting: Licensed Clinical Social Worker

## 2007-02-03 ENCOUNTER — Encounter (INDEPENDENT_AMBULATORY_CARE_PROVIDER_SITE_OTHER): Payer: Self-pay | Admitting: Internal Medicine

## 2007-02-03 LAB — CONVERTED CEMR LAB
Blood Glucose, Fingerstick: 142
Blood Glucose, Home Monitor: 1 mg/dL

## 2007-02-04 ENCOUNTER — Encounter (INDEPENDENT_AMBULATORY_CARE_PROVIDER_SITE_OTHER): Payer: Self-pay | Admitting: Internal Medicine

## 2007-02-04 LAB — CONVERTED CEMR LAB
BUN: 48 mg/dL — ABNORMAL HIGH (ref 6–23)
CO2: 22 meq/L (ref 19–32)
Calcium: 9.1 mg/dL (ref 8.4–10.5)
Creatinine, Ser: 1.94 mg/dL — ABNORMAL HIGH (ref 0.40–1.20)
Glucose, Bld: 124 mg/dL — ABNORMAL HIGH (ref 70–99)

## 2007-02-16 ENCOUNTER — Telehealth: Payer: Self-pay | Admitting: *Deleted

## 2007-02-18 ENCOUNTER — Encounter (INDEPENDENT_AMBULATORY_CARE_PROVIDER_SITE_OTHER): Payer: Self-pay | Admitting: Hospitalist

## 2007-02-18 ENCOUNTER — Ambulatory Visit: Payer: Self-pay | Admitting: Vascular Surgery

## 2007-02-18 ENCOUNTER — Encounter (INDEPENDENT_AMBULATORY_CARE_PROVIDER_SITE_OTHER): Payer: Self-pay | Admitting: Internal Medicine

## 2007-02-18 ENCOUNTER — Ambulatory Visit: Payer: Self-pay | Admitting: Hospitalist

## 2007-02-18 ENCOUNTER — Ambulatory Visit (HOSPITAL_COMMUNITY): Admission: RE | Admit: 2007-02-18 | Discharge: 2007-02-18 | Payer: Self-pay | Admitting: Hospitalist

## 2007-02-19 LAB — CONVERTED CEMR LAB
Basophils Relative: 0 % (ref 0–1)
Eosinophils Absolute: 0.2 10*3/uL (ref 0.0–0.7)
Eosinophils Relative: 3 % (ref 0–5)
HCT: 34.4 % — ABNORMAL LOW (ref 36.0–46.0)
MCHC: 32 g/dL (ref 30.0–36.0)
MCV: 90.8 fL (ref 78.0–100.0)
Neutrophils Relative %: 65 % (ref 43–77)
Platelets: 194 10*3/uL (ref 150–400)
RDW: 13.3 % (ref 11.5–14.0)

## 2007-03-09 ENCOUNTER — Encounter (INDEPENDENT_AMBULATORY_CARE_PROVIDER_SITE_OTHER): Payer: Self-pay | Admitting: Internal Medicine

## 2007-03-10 ENCOUNTER — Encounter (INDEPENDENT_AMBULATORY_CARE_PROVIDER_SITE_OTHER): Payer: Self-pay | Admitting: Internal Medicine

## 2007-03-12 ENCOUNTER — Ambulatory Visit: Payer: Self-pay | Admitting: Oncology

## 2007-03-16 ENCOUNTER — Encounter (INDEPENDENT_AMBULATORY_CARE_PROVIDER_SITE_OTHER): Payer: Self-pay | Admitting: Internal Medicine

## 2007-03-16 LAB — CBC WITH DIFFERENTIAL/PLATELET
Basophils Absolute: 0 10*3/uL (ref 0.0–0.1)
Eosinophils Absolute: 0.2 10*3/uL (ref 0.0–0.5)
HCT: 30.3 % — ABNORMAL LOW (ref 34.8–46.6)
HGB: 10.5 g/dL — ABNORMAL LOW (ref 11.6–15.9)
LYMPH%: 15.8 % (ref 14.0–48.0)
MCHC: 34.6 g/dL (ref 32.0–36.0)
MONO#: 0.5 10*3/uL (ref 0.1–0.9)
NEUT#: 4.9 10*3/uL (ref 1.5–6.5)
NEUT%: 74 % (ref 39.6–76.8)
Platelets: 162 10*3/uL (ref 145–400)
WBC: 6.6 10*3/uL (ref 3.9–10.0)

## 2007-03-16 LAB — IRON AND TIBC
%SAT: 23 % (ref 20–55)
Iron: 53 ug/dL (ref 42–145)
TIBC: 226 ug/dL — ABNORMAL LOW (ref 250–470)
UIBC: 173 ug/dL

## 2007-03-16 LAB — COMPREHENSIVE METABOLIC PANEL
BUN: 35 mg/dL — ABNORMAL HIGH (ref 6–23)
CO2: 22 mEq/L (ref 19–32)
Calcium: 9.4 mg/dL (ref 8.4–10.5)
Creatinine, Ser: 1.47 mg/dL — ABNORMAL HIGH (ref 0.40–1.20)
Glucose, Bld: 119 mg/dL — ABNORMAL HIGH (ref 70–99)
Total Bilirubin: 0.4 mg/dL (ref 0.3–1.2)

## 2007-03-16 LAB — FERRITIN: Ferritin: 427 ng/mL — ABNORMAL HIGH (ref 10–291)

## 2007-03-16 LAB — IGG, IGA, IGM: IgA: 632 mg/dL — ABNORMAL HIGH (ref 68–378)

## 2007-03-25 ENCOUNTER — Encounter (INDEPENDENT_AMBULATORY_CARE_PROVIDER_SITE_OTHER): Payer: Self-pay | Admitting: Internal Medicine

## 2007-03-29 ENCOUNTER — Telehealth: Payer: Self-pay | Admitting: Licensed Clinical Social Worker

## 2007-04-09 ENCOUNTER — Telehealth (INDEPENDENT_AMBULATORY_CARE_PROVIDER_SITE_OTHER): Payer: Self-pay | Admitting: Internal Medicine

## 2007-05-07 ENCOUNTER — Ambulatory Visit: Payer: Self-pay | Admitting: Oncology

## 2007-06-07 ENCOUNTER — Encounter (INDEPENDENT_AMBULATORY_CARE_PROVIDER_SITE_OTHER): Payer: Self-pay | Admitting: Internal Medicine

## 2007-06-09 ENCOUNTER — Telehealth (INDEPENDENT_AMBULATORY_CARE_PROVIDER_SITE_OTHER): Payer: Self-pay | Admitting: Internal Medicine

## 2007-06-16 ENCOUNTER — Telehealth (INDEPENDENT_AMBULATORY_CARE_PROVIDER_SITE_OTHER): Payer: Self-pay | Admitting: Internal Medicine

## 2007-06-21 ENCOUNTER — Encounter (INDEPENDENT_AMBULATORY_CARE_PROVIDER_SITE_OTHER): Payer: Self-pay | Admitting: Internal Medicine

## 2007-06-24 ENCOUNTER — Ambulatory Visit: Payer: Self-pay | Admitting: Oncology

## 2007-06-28 ENCOUNTER — Encounter (INDEPENDENT_AMBULATORY_CARE_PROVIDER_SITE_OTHER): Payer: Self-pay | Admitting: Internal Medicine

## 2007-06-28 LAB — CBC WITH DIFFERENTIAL/PLATELET
BASO%: 0.5 % (ref 0.0–2.0)
EOS%: 2.1 % (ref 0.0–7.0)
HCT: 33.2 % — ABNORMAL LOW (ref 34.8–46.6)
LYMPH%: 19.1 % (ref 14.0–48.0)
MCH: 28.6 pg (ref 26.0–34.0)
MCHC: 32.6 g/dL (ref 32.0–36.0)
MCV: 87.9 fL (ref 81.0–101.0)
MONO%: 9.9 % (ref 0.0–13.0)
NEUT%: 68.4 % (ref 39.6–76.8)
Platelets: 158 10*3/uL (ref 145–400)
RBC: 3.78 10*6/uL (ref 3.70–5.32)

## 2007-06-28 LAB — COMPREHENSIVE METABOLIC PANEL
AST: 12 U/L (ref 0–37)
Alkaline Phosphatase: 75 U/L (ref 39–117)
BUN: 33 mg/dL — ABNORMAL HIGH (ref 6–23)
Calcium: 9.3 mg/dL (ref 8.4–10.5)
Chloride: 103 mEq/L (ref 96–112)
Creatinine, Ser: 1.89 mg/dL — ABNORMAL HIGH (ref 0.40–1.20)
Total Bilirubin: 0.5 mg/dL (ref 0.3–1.2)

## 2007-06-28 LAB — IRON AND TIBC
Iron: 55 ug/dL (ref 42–145)
TIBC: 227 ug/dL — ABNORMAL LOW (ref 250–470)
UIBC: 172 ug/dL

## 2007-06-28 LAB — FERRITIN: Ferritin: 362 ng/mL — ABNORMAL HIGH (ref 10–291)

## 2007-06-28 LAB — ERYTHROCYTE SEDIMENTATION RATE: Sed Rate: 45 mm/hr — ABNORMAL HIGH (ref 0–30)

## 2007-06-29 ENCOUNTER — Encounter (INDEPENDENT_AMBULATORY_CARE_PROVIDER_SITE_OTHER): Payer: Self-pay | Admitting: Internal Medicine

## 2007-07-08 ENCOUNTER — Telehealth: Payer: Self-pay | Admitting: *Deleted

## 2007-07-12 ENCOUNTER — Encounter (INDEPENDENT_AMBULATORY_CARE_PROVIDER_SITE_OTHER): Payer: Self-pay | Admitting: Internal Medicine

## 2007-07-13 ENCOUNTER — Telehealth: Payer: Self-pay | Admitting: *Deleted

## 2007-08-19 ENCOUNTER — Ambulatory Visit: Payer: Self-pay | Admitting: Internal Medicine

## 2007-08-19 ENCOUNTER — Ambulatory Visit: Payer: Self-pay | Admitting: Oncology

## 2007-08-19 ENCOUNTER — Encounter (INDEPENDENT_AMBULATORY_CARE_PROVIDER_SITE_OTHER): Payer: Self-pay | Admitting: Internal Medicine

## 2007-08-19 LAB — CONVERTED CEMR LAB
Blood Glucose, Fingerstick: 154
CO2: 24 meq/L (ref 19–32)
Calcium: 9.2 mg/dL (ref 8.4–10.5)
Chloride: 105 meq/L (ref 96–112)
Creatinine, Ser: 1.49 mg/dL — ABNORMAL HIGH (ref 0.40–1.20)
Glucose, Bld: 131 mg/dL — ABNORMAL HIGH (ref 70–99)
Hgb A1c MFr Bld: 7 %

## 2007-08-23 ENCOUNTER — Encounter (INDEPENDENT_AMBULATORY_CARE_PROVIDER_SITE_OTHER): Payer: Self-pay | Admitting: Internal Medicine

## 2007-08-30 ENCOUNTER — Encounter (HOSPITAL_BASED_OUTPATIENT_CLINIC_OR_DEPARTMENT_OTHER): Admission: RE | Admit: 2007-08-30 | Discharge: 2007-11-28 | Payer: Self-pay | Admitting: Internal Medicine

## 2007-09-02 ENCOUNTER — Encounter (INDEPENDENT_AMBULATORY_CARE_PROVIDER_SITE_OTHER): Payer: Self-pay | Admitting: Internal Medicine

## 2007-09-02 LAB — HM DIABETES EYE EXAM

## 2007-09-07 ENCOUNTER — Encounter (INDEPENDENT_AMBULATORY_CARE_PROVIDER_SITE_OTHER): Payer: Self-pay | Admitting: Surgery

## 2007-09-08 ENCOUNTER — Ambulatory Visit (HOSPITAL_COMMUNITY): Admission: RE | Admit: 2007-09-08 | Discharge: 2007-09-08 | Payer: Self-pay | Admitting: Surgery

## 2007-09-08 ENCOUNTER — Encounter (INDEPENDENT_AMBULATORY_CARE_PROVIDER_SITE_OTHER): Payer: Self-pay | Admitting: Surgery

## 2007-09-08 ENCOUNTER — Ambulatory Visit: Payer: Self-pay | Admitting: Vascular Surgery

## 2007-09-09 ENCOUNTER — Encounter (INDEPENDENT_AMBULATORY_CARE_PROVIDER_SITE_OTHER): Payer: Self-pay | Admitting: Internal Medicine

## 2007-09-15 ENCOUNTER — Ambulatory Visit (HOSPITAL_COMMUNITY): Admission: RE | Admit: 2007-09-15 | Discharge: 2007-09-15 | Payer: Self-pay | Admitting: Internal Medicine

## 2007-09-23 ENCOUNTER — Ambulatory Visit (HOSPITAL_COMMUNITY): Admission: RE | Admit: 2007-09-23 | Discharge: 2007-09-23 | Payer: Self-pay | Admitting: Internal Medicine

## 2007-09-24 ENCOUNTER — Encounter (INDEPENDENT_AMBULATORY_CARE_PROVIDER_SITE_OTHER): Payer: Self-pay | Admitting: Internal Medicine

## 2007-10-19 ENCOUNTER — Encounter: Admission: RE | Admit: 2007-10-19 | Discharge: 2007-10-19 | Payer: Self-pay | Admitting: Internal Medicine

## 2007-10-26 ENCOUNTER — Ambulatory Visit: Payer: Self-pay | Admitting: Oncology

## 2007-10-28 ENCOUNTER — Encounter (INDEPENDENT_AMBULATORY_CARE_PROVIDER_SITE_OTHER): Payer: Self-pay | Admitting: Internal Medicine

## 2007-10-28 LAB — ERYTHROCYTE SEDIMENTATION RATE: Sed Rate: 44 mm/hr — ABNORMAL HIGH (ref 0–30)

## 2007-10-28 LAB — IRON AND TIBC
%SAT: 25 % (ref 20–55)
TIBC: 225 ug/dL — ABNORMAL LOW (ref 250–470)
UIBC: 168 ug/dL

## 2007-10-28 LAB — CBC WITH DIFFERENTIAL/PLATELET
BASO%: 0.6 % (ref 0.0–2.0)
Eosinophils Absolute: 0.2 10*3/uL (ref 0.0–0.5)
HCT: 31.4 % — ABNORMAL LOW (ref 34.8–46.6)
LYMPH%: 22.2 % (ref 14.0–48.0)
MCHC: 33.7 g/dL (ref 32.0–36.0)
MCV: 88.4 fL (ref 81.0–101.0)
MONO#: 0.7 10*3/uL (ref 0.1–0.9)
MONO%: 10.9 % (ref 0.0–13.0)
NEUT%: 63.9 % (ref 39.6–76.8)
Platelets: 123 10*3/uL — ABNORMAL LOW (ref 145–400)
RBC: 3.55 10*6/uL — ABNORMAL LOW (ref 3.70–5.32)

## 2007-10-28 LAB — LACTATE DEHYDROGENASE: LDH: 136 U/L (ref 94–250)

## 2007-10-28 LAB — COMPREHENSIVE METABOLIC PANEL
ALT: 9 U/L (ref 0–35)
BUN: 32 mg/dL — ABNORMAL HIGH (ref 6–23)
CO2: 22 mEq/L (ref 19–32)
Calcium: 8.7 mg/dL (ref 8.4–10.5)
Chloride: 105 mEq/L (ref 96–112)
Creatinine, Ser: 1.47 mg/dL — ABNORMAL HIGH (ref 0.40–1.20)
Total Bilirubin: 0.4 mg/dL (ref 0.3–1.2)

## 2007-11-03 ENCOUNTER — Ambulatory Visit: Payer: Self-pay | Admitting: Internal Medicine

## 2007-11-03 ENCOUNTER — Encounter (INDEPENDENT_AMBULATORY_CARE_PROVIDER_SITE_OTHER): Payer: Self-pay | Admitting: Internal Medicine

## 2007-11-03 DIAGNOSIS — L819 Disorder of pigmentation, unspecified: Secondary | ICD-10-CM | POA: Insufficient documentation

## 2007-11-05 LAB — CONVERTED CEMR LAB
BUN: 28 mg/dL — ABNORMAL HIGH (ref 6–23)
CO2: 22 meq/L (ref 19–32)
Calcium: 9.5 mg/dL (ref 8.4–10.5)
Glucose, Bld: 161 mg/dL — ABNORMAL HIGH (ref 70–99)
Potassium: 4.5 meq/L (ref 3.5–5.3)
Vitamin B-12: 1286 pg/mL — ABNORMAL HIGH (ref 211–911)

## 2007-12-16 ENCOUNTER — Encounter (INDEPENDENT_AMBULATORY_CARE_PROVIDER_SITE_OTHER): Payer: Self-pay | Admitting: Internal Medicine

## 2007-12-21 ENCOUNTER — Ambulatory Visit: Payer: Self-pay | Admitting: Oncology

## 2007-12-24 ENCOUNTER — Encounter (INDEPENDENT_AMBULATORY_CARE_PROVIDER_SITE_OTHER): Payer: Self-pay | Admitting: Internal Medicine

## 2008-01-26 ENCOUNTER — Telehealth (INDEPENDENT_AMBULATORY_CARE_PROVIDER_SITE_OTHER): Payer: Self-pay | Admitting: Pharmacy Technician

## 2008-02-10 ENCOUNTER — Ambulatory Visit: Payer: Self-pay | Admitting: Oncology

## 2008-02-17 ENCOUNTER — Encounter (INDEPENDENT_AMBULATORY_CARE_PROVIDER_SITE_OTHER): Payer: Self-pay | Admitting: Internal Medicine

## 2008-02-17 LAB — COMPREHENSIVE METABOLIC PANEL
ALT: <8 U/L (ref 0–35)
Alkaline Phosphatase: 71 U/L (ref 39–117)
CO2: 25 mEq/L (ref 19–32)
Creatinine, Ser: 2.05 mg/dL — ABNORMAL HIGH (ref 0.40–1.20)
Total Bilirubin: 0.5 mg/dL (ref 0.3–1.2)

## 2008-02-17 LAB — CBC WITH DIFFERENTIAL/PLATELET
BASO%: 0.6 % (ref 0.0–2.0)
HCT: 34.7 % — ABNORMAL LOW (ref 34.8–46.6)
LYMPH%: 18.9 % (ref 14.0–48.0)
MCH: 29 pg (ref 26.0–34.0)
MCHC: 32.8 g/dL (ref 32.0–36.0)
MCV: 88.4 fL (ref 81.0–101.0)
MONO#: 0.8 10*3/uL (ref 0.1–0.9)
MONO%: 10.5 % (ref 0.0–13.0)
NEUT%: 67.5 % (ref 39.6–76.8)
Platelets: 137 10*3/uL — ABNORMAL LOW (ref 145–400)
WBC: 7.3 10*3/uL (ref 3.9–10.0)

## 2008-02-17 LAB — IGG, IGA, IGM
IgA: 572 mg/dL — ABNORMAL HIGH (ref 68–378)
IgM, Serum: 101 mg/dL (ref 60–263)

## 2008-02-17 LAB — LACTATE DEHYDROGENASE: LDH: 137 U/L (ref 94–250)

## 2008-02-17 LAB — VITAMIN B12: Vitamin B-12: 818 pg/mL (ref 211–911)

## 2008-03-27 ENCOUNTER — Ambulatory Visit: Payer: Self-pay | Admitting: Oncology

## 2008-05-16 ENCOUNTER — Ambulatory Visit: Payer: Self-pay | Admitting: Oncology

## 2008-05-29 ENCOUNTER — Telehealth (INDEPENDENT_AMBULATORY_CARE_PROVIDER_SITE_OTHER): Payer: Self-pay | Admitting: Pharmacy Technician

## 2008-06-06 ENCOUNTER — Encounter (INDEPENDENT_AMBULATORY_CARE_PROVIDER_SITE_OTHER): Payer: Self-pay | Admitting: Internal Medicine

## 2008-06-14 ENCOUNTER — Ambulatory Visit: Payer: Self-pay | Admitting: Internal Medicine

## 2008-06-14 ENCOUNTER — Ambulatory Visit (HOSPITAL_COMMUNITY): Admission: RE | Admit: 2008-06-14 | Discharge: 2008-06-14 | Payer: Self-pay | Admitting: Internal Medicine

## 2008-06-14 ENCOUNTER — Encounter (INDEPENDENT_AMBULATORY_CARE_PROVIDER_SITE_OTHER): Payer: Self-pay | Admitting: Internal Medicine

## 2008-06-14 ENCOUNTER — Encounter: Payer: Self-pay | Admitting: Internal Medicine

## 2008-06-14 DIAGNOSIS — M86179 Other acute osteomyelitis, unspecified ankle and foot: Secondary | ICD-10-CM | POA: Insufficient documentation

## 2008-06-15 ENCOUNTER — Encounter (INDEPENDENT_AMBULATORY_CARE_PROVIDER_SITE_OTHER): Payer: Self-pay | Admitting: Internal Medicine

## 2008-06-15 ENCOUNTER — Telehealth: Payer: Self-pay | Admitting: Internal Medicine

## 2008-06-15 LAB — CONVERTED CEMR LAB
Albumin: 3.9 g/dL (ref 3.5–5.2)
Alkaline Phosphatase: 68 units/L (ref 39–117)
BUN: 30 mg/dL — ABNORMAL HIGH (ref 6–23)
CO2: 24 meq/L (ref 19–32)
Calcium: 9 mg/dL (ref 8.4–10.5)
Cholesterol: 217 mg/dL — ABNORMAL HIGH (ref 0–200)
Glucose, Bld: 127 mg/dL — ABNORMAL HIGH (ref 70–99)
HDL: 77 mg/dL (ref >39)
LDL Cholesterol: 124 mg/dL — ABNORMAL HIGH (ref 0–99)
Potassium: 4.4 meq/L (ref 3.5–5.3)
Sodium: 139 meq/L (ref 135–145)
Total Protein: 7.2 g/dL (ref 6.0–8.3)
Triglycerides: 80 mg/dL (ref <150)

## 2008-06-20 ENCOUNTER — Encounter (INDEPENDENT_AMBULATORY_CARE_PROVIDER_SITE_OTHER): Payer: Self-pay | Admitting: Internal Medicine

## 2008-06-23 ENCOUNTER — Encounter (INDEPENDENT_AMBULATORY_CARE_PROVIDER_SITE_OTHER): Payer: Self-pay | Admitting: Orthopaedic Surgery

## 2008-06-23 ENCOUNTER — Ambulatory Visit (HOSPITAL_BASED_OUTPATIENT_CLINIC_OR_DEPARTMENT_OTHER): Admission: RE | Admit: 2008-06-23 | Discharge: 2008-06-23 | Payer: Self-pay | Admitting: Orthopaedic Surgery

## 2008-06-28 ENCOUNTER — Ambulatory Visit: Payer: Self-pay | Admitting: Internal Medicine

## 2008-07-11 ENCOUNTER — Ambulatory Visit: Payer: Self-pay | Admitting: Oncology

## 2008-07-13 ENCOUNTER — Encounter (INDEPENDENT_AMBULATORY_CARE_PROVIDER_SITE_OTHER): Payer: Self-pay | Admitting: Internal Medicine

## 2008-07-13 LAB — LACTATE DEHYDROGENASE: LDH: 144 U/L (ref 94–250)

## 2008-07-13 LAB — COMPREHENSIVE METABOLIC PANEL
ALT: <8 U/L (ref 0–35)
BUN: 25 mg/dL — ABNORMAL HIGH (ref 6–23)
CO2: 28 mEq/L (ref 19–32)
Creatinine, Ser: 1.52 mg/dL — ABNORMAL HIGH (ref 0.40–1.20)
Glucose, Bld: 96 mg/dL (ref 70–99)
Total Bilirubin: 0.5 mg/dL (ref 0.3–1.2)

## 2008-07-13 LAB — CBC WITH DIFFERENTIAL/PLATELET
BASO%: 0.3 % (ref 0.0–2.0)
Basophils Absolute: 0 10*3/uL (ref 0.0–0.1)
HCT: 32.5 % — ABNORMAL LOW (ref 34.8–46.6)
LYMPH%: 16.8 % (ref 14.0–49.7)
MCHC: 33.2 g/dL (ref 31.5–36.0)
MONO#: 0.7 10*3/uL (ref 0.1–0.9)
NEUT%: 69.7 % (ref 38.4–76.8)
Platelets: 145 10*3/uL (ref 145–400)
WBC: 6.7 10*3/uL (ref 3.9–10.3)

## 2008-07-13 LAB — IGG, IGA, IGM
IgA: 668 mg/dL — ABNORMAL HIGH (ref 68–378)
IgG (Immunoglobin G), Serum: 1290 mg/dL (ref 694–1618)
IgM, Serum: 90 mg/dL (ref 60–263)

## 2008-07-13 LAB — VITAMIN B12: Vitamin B-12: 741 pg/mL (ref 211–911)

## 2008-07-28 ENCOUNTER — Encounter (INDEPENDENT_AMBULATORY_CARE_PROVIDER_SITE_OTHER): Payer: Self-pay | Admitting: Internal Medicine

## 2008-08-03 ENCOUNTER — Encounter (INDEPENDENT_AMBULATORY_CARE_PROVIDER_SITE_OTHER): Payer: Self-pay | Admitting: Internal Medicine

## 2008-08-03 ENCOUNTER — Ambulatory Visit: Payer: Self-pay | Admitting: *Deleted

## 2008-08-03 LAB — CONVERTED CEMR LAB: Blood Glucose, Fingerstick: 85

## 2008-08-07 ENCOUNTER — Encounter (INDEPENDENT_AMBULATORY_CARE_PROVIDER_SITE_OTHER): Payer: Self-pay | Admitting: Internal Medicine

## 2008-08-07 LAB — CONVERTED CEMR LAB
AST: 12 units/L (ref 0–37)
Albumin: 4.1 g/dL (ref 3.5–5.2)
BUN: 29 mg/dL — ABNORMAL HIGH (ref 6–23)
CO2: 23 meq/L (ref 19–32)
Calcium: 9.1 mg/dL (ref 8.4–10.5)
Chloride: 101 meq/L (ref 96–112)
Cholesterol: 220 mg/dL — ABNORMAL HIGH (ref 0–200)
Creatinine, Ser: 1.62 mg/dL — ABNORMAL HIGH (ref 0.40–1.20)
Eosinophils Absolute: 0.2 10*3/uL (ref 0.0–0.7)
Eosinophils Relative: 3 % (ref 0–5)
HCT: 33.5 % — ABNORMAL LOW (ref 36.0–46.0)
HDL: 82 mg/dL (ref >39)
Hemoglobin: 10.7 g/dL — ABNORMAL LOW (ref 12.0–15.0)
Lymphocytes Relative: 20 % (ref 12–46)
Lymphs Abs: 1.3 10*3/uL (ref 0.7–4.0)
MCV: 90.1 fL (ref 78.0–100.0)
Monocytes Absolute: 0.7 10*3/uL (ref 0.1–1.0)
Monocytes Relative: 12 % (ref 3–12)
Platelets: 165 10*3/uL (ref 150–400)
Potassium: 4.4 meq/L (ref 3.5–5.3)
RBC: 3.72 M/uL — ABNORMAL LOW (ref 3.87–5.11)
Vitamin B-12: 722 pg/mL (ref 211–911)
WBC: 6.3 10*3/uL (ref 4.0–10.5)

## 2008-08-15 ENCOUNTER — Encounter (INDEPENDENT_AMBULATORY_CARE_PROVIDER_SITE_OTHER): Payer: Self-pay | Admitting: Internal Medicine

## 2008-08-15 ENCOUNTER — Ambulatory Visit: Payer: Self-pay | Admitting: Internal Medicine

## 2008-08-15 LAB — CONVERTED CEMR LAB
CO2: 28 meq/L (ref 19–32)
Chloride: 102 meq/L (ref 96–112)
Creatinine, Ser: 1.73 mg/dL — ABNORMAL HIGH (ref 0.40–1.20)
Glucose, Bld: 128 mg/dL — ABNORMAL HIGH (ref 70–99)
Sodium: 139 meq/L (ref 135–145)

## 2008-09-05 ENCOUNTER — Ambulatory Visit: Payer: Self-pay | Admitting: Oncology

## 2008-10-02 ENCOUNTER — Encounter (INDEPENDENT_AMBULATORY_CARE_PROVIDER_SITE_OTHER): Payer: Self-pay | Admitting: Internal Medicine

## 2008-10-31 ENCOUNTER — Ambulatory Visit: Payer: Self-pay | Admitting: Oncology

## 2008-11-03 ENCOUNTER — Telehealth (INDEPENDENT_AMBULATORY_CARE_PROVIDER_SITE_OTHER): Payer: Self-pay | Admitting: Internal Medicine

## 2008-11-14 ENCOUNTER — Encounter (HOSPITAL_BASED_OUTPATIENT_CLINIC_OR_DEPARTMENT_OTHER): Admission: RE | Admit: 2008-11-14 | Discharge: 2009-02-12 | Payer: Self-pay | Admitting: Internal Medicine

## 2008-11-16 ENCOUNTER — Telehealth: Payer: Self-pay | Admitting: Internal Medicine

## 2008-11-22 ENCOUNTER — Telehealth: Payer: Self-pay | Admitting: Internal Medicine

## 2008-11-23 ENCOUNTER — Encounter: Payer: Self-pay | Admitting: Internal Medicine

## 2008-11-28 ENCOUNTER — Ambulatory Visit: Payer: Self-pay | Admitting: Oncology

## 2008-11-30 LAB — COMPREHENSIVE METABOLIC PANEL
AST: 9 U/L (ref 0–37)
Albumin: 3.6 g/dL (ref 3.5–5.2)
Alkaline Phosphatase: 76 U/L (ref 39–117)
BUN: 48 mg/dL — ABNORMAL HIGH (ref 6–23)
Creatinine, Ser: 1.8 mg/dL — ABNORMAL HIGH (ref 0.40–1.20)
Potassium: 4.2 mEq/L (ref 3.5–5.3)

## 2008-11-30 LAB — CBC WITH DIFFERENTIAL/PLATELET
BASO%: 0.6 % (ref 0.0–2.0)
Basophils Absolute: 0 10*3/uL (ref 0.0–0.1)
EOS%: 1.1 % (ref 0.0–7.0)
HGB: 10.2 g/dL — ABNORMAL LOW (ref 11.6–15.9)
MCH: 29.3 pg (ref 25.1–34.0)
MCHC: 33.8 g/dL (ref 31.5–36.0)
MCV: 86.7 fL (ref 79.5–101.0)
MONO%: 10.4 % (ref 0.0–14.0)
RBC: 3.49 10*6/uL — ABNORMAL LOW (ref 3.70–5.45)
RDW: 14.9 % — ABNORMAL HIGH (ref 11.2–14.5)

## 2008-12-05 ENCOUNTER — Ambulatory Visit: Payer: Self-pay | Admitting: Vascular Surgery

## 2008-12-05 ENCOUNTER — Encounter (HOSPITAL_BASED_OUTPATIENT_CLINIC_OR_DEPARTMENT_OTHER): Payer: Self-pay | Admitting: Internal Medicine

## 2008-12-05 ENCOUNTER — Ambulatory Visit: Admission: RE | Admit: 2008-12-05 | Discharge: 2008-12-05 | Payer: Self-pay | Admitting: Internal Medicine

## 2008-12-08 ENCOUNTER — Ambulatory Visit (HOSPITAL_COMMUNITY): Admission: RE | Admit: 2008-12-08 | Discharge: 2008-12-08 | Payer: Self-pay | Admitting: General Surgery

## 2009-01-11 ENCOUNTER — Telehealth: Payer: Self-pay | Admitting: Internal Medicine

## 2009-01-23 ENCOUNTER — Ambulatory Visit: Payer: Self-pay | Admitting: Oncology

## 2009-02-13 ENCOUNTER — Encounter (HOSPITAL_BASED_OUTPATIENT_CLINIC_OR_DEPARTMENT_OTHER): Admission: RE | Admit: 2009-02-13 | Discharge: 2009-03-26 | Payer: Self-pay | Admitting: Internal Medicine

## 2009-03-20 ENCOUNTER — Ambulatory Visit: Payer: Self-pay | Admitting: Oncology

## 2009-05-15 ENCOUNTER — Ambulatory Visit: Payer: Self-pay | Admitting: Oncology

## 2009-05-17 ENCOUNTER — Encounter: Payer: Self-pay | Admitting: Internal Medicine

## 2009-05-17 LAB — COMPREHENSIVE METABOLIC PANEL
BUN: 35 mg/dL — ABNORMAL HIGH (ref 6–23)
CO2: 24 mEq/L (ref 19–32)
Calcium: 9.1 mg/dL (ref 8.4–10.5)
Chloride: 104 mEq/L (ref 96–112)
Creatinine, Ser: 1.83 mg/dL — ABNORMAL HIGH (ref 0.40–1.20)
Glucose, Bld: 102 mg/dL — ABNORMAL HIGH (ref 70–99)
Total Bilirubin: 0.4 mg/dL (ref 0.3–1.2)

## 2009-05-17 LAB — CBC WITH DIFFERENTIAL/PLATELET
Basophils Absolute: 0 10*3/uL (ref 0.0–0.1)
HCT: 33 % — ABNORMAL LOW (ref 34.8–46.6)
HGB: 10.9 g/dL — ABNORMAL LOW (ref 11.6–15.9)
LYMPH%: 16.3 % (ref 14.0–49.7)
MONO#: 0.7 10*3/uL (ref 0.1–0.9)
NEUT%: 71.1 % (ref 38.4–76.8)
Platelets: 122 10*3/uL — ABNORMAL LOW (ref 145–400)
WBC: 7 10*3/uL (ref 3.9–10.3)
lymph#: 1.1 10*3/uL (ref 0.9–3.3)

## 2009-05-17 LAB — LACTATE DEHYDROGENASE: LDH: 137 U/L (ref 94–250)

## 2009-05-17 LAB — VITAMIN B12: Vitamin B-12: 727 pg/mL (ref 211–911)

## 2009-05-17 LAB — IGG, IGA, IGM
IgA: 572 mg/dL — ABNORMAL HIGH (ref 68–378)
IgG (Immunoglobin G), Serum: 1130 mg/dL (ref 694–1618)

## 2009-07-10 ENCOUNTER — Ambulatory Visit: Payer: Self-pay | Admitting: Oncology

## 2009-08-14 ENCOUNTER — Telehealth: Payer: Self-pay | Admitting: Internal Medicine

## 2009-08-31 ENCOUNTER — Inpatient Hospital Stay (HOSPITAL_COMMUNITY): Admission: EM | Admit: 2009-08-31 | Discharge: 2009-09-05 | Payer: Self-pay | Admitting: Emergency Medicine

## 2009-08-31 ENCOUNTER — Ambulatory Visit: Payer: Self-pay | Admitting: Internal Medicine

## 2009-08-31 ENCOUNTER — Telehealth: Payer: Self-pay | Admitting: Internal Medicine

## 2009-08-31 ENCOUNTER — Encounter: Payer: Self-pay | Admitting: Internal Medicine

## 2009-08-31 LAB — CONVERTED CEMR LAB
HDL: 52 mg/dL
Hgb A1c MFr Bld: 7.4 %
LDL Cholesterol: 81 mg/dL
Triglycerides: 62 mg/dL

## 2009-09-02 ENCOUNTER — Encounter: Payer: Self-pay | Admitting: Internal Medicine

## 2009-09-03 ENCOUNTER — Encounter: Payer: Self-pay | Admitting: Internal Medicine

## 2009-09-03 ENCOUNTER — Ambulatory Visit: Payer: Self-pay | Admitting: Vascular Surgery

## 2009-09-04 ENCOUNTER — Ambulatory Visit: Payer: Self-pay | Admitting: Oncology

## 2009-09-05 ENCOUNTER — Encounter: Payer: Self-pay | Admitting: Internal Medicine

## 2009-09-05 DIAGNOSIS — Z8739 Personal history of other diseases of the musculoskeletal system and connective tissue: Secondary | ICD-10-CM

## 2009-09-19 ENCOUNTER — Ambulatory Visit: Payer: Self-pay | Admitting: Internal Medicine

## 2009-09-19 LAB — CONVERTED CEMR LAB
AST: 12 units/L (ref 0–37)
Alkaline Phosphatase: 74 units/L (ref 39–117)
BUN: 25 mg/dL — ABNORMAL HIGH (ref 6–23)
Blood Glucose, Fingerstick: 101
Creatinine, Ser: 1.2 mg/dL (ref 0.40–1.20)

## 2009-10-26 ENCOUNTER — Telehealth: Payer: Self-pay | Admitting: Internal Medicine

## 2009-10-30 ENCOUNTER — Ambulatory Visit: Payer: Self-pay | Admitting: Oncology

## 2009-11-02 ENCOUNTER — Encounter: Payer: Self-pay | Admitting: Internal Medicine

## 2009-11-02 LAB — CBC WITH DIFFERENTIAL/PLATELET
Basophils Absolute: 0 10*3/uL (ref 0.0–0.1)
EOS%: 3.3 % (ref 0.0–7.0)
HCT: 32.3 % — ABNORMAL LOW (ref 34.8–46.6)
HGB: 10.9 g/dL — ABNORMAL LOW (ref 11.6–15.9)
MCH: 28.6 pg (ref 25.1–34.0)
MCV: 85.3 fL (ref 79.5–101.0)
MONO%: 9.4 % (ref 0.0–14.0)
NEUT%: 62.2 % (ref 38.4–76.8)
Platelets: 143 10*3/uL — ABNORMAL LOW (ref 145–400)

## 2009-11-02 LAB — COMPREHENSIVE METABOLIC PANEL
AST: 8 U/L (ref 0–37)
Alkaline Phosphatase: 71 U/L (ref 39–117)
BUN: 50 mg/dL — ABNORMAL HIGH (ref 6–23)
Calcium: 9.2 mg/dL (ref 8.4–10.5)
Chloride: 105 mEq/L (ref 96–112)
Creatinine, Ser: 1.81 mg/dL — ABNORMAL HIGH (ref 0.40–1.20)

## 2009-11-02 LAB — IGG, IGA, IGM
IgA: 629 mg/dL — ABNORMAL HIGH (ref 68–378)
IgG (Immunoglobin G), Serum: 1550 mg/dL (ref 694–1618)

## 2009-11-21 ENCOUNTER — Telehealth: Payer: Self-pay | Admitting: Internal Medicine

## 2009-12-17 ENCOUNTER — Telehealth: Payer: Self-pay | Admitting: Internal Medicine

## 2009-12-26 ENCOUNTER — Telehealth: Payer: Self-pay | Admitting: Internal Medicine

## 2009-12-27 ENCOUNTER — Ambulatory Visit: Payer: Self-pay | Admitting: Oncology

## 2010-01-07 ENCOUNTER — Telehealth: Payer: Self-pay | Admitting: Internal Medicine

## 2010-02-12 ENCOUNTER — Encounter (HOSPITAL_BASED_OUTPATIENT_CLINIC_OR_DEPARTMENT_OTHER)
Admission: RE | Admit: 2010-02-12 | Discharge: 2010-05-13 | Payer: Self-pay | Source: Home / Self Care | Attending: General Surgery | Admitting: General Surgery

## 2010-02-13 ENCOUNTER — Ambulatory Visit (HOSPITAL_COMMUNITY): Admission: RE | Admit: 2010-02-13 | Discharge: 2010-02-13 | Payer: Self-pay | Admitting: General Surgery

## 2010-03-08 ENCOUNTER — Telehealth: Payer: Self-pay | Admitting: Internal Medicine

## 2010-04-19 ENCOUNTER — Ambulatory Visit: Payer: Self-pay | Admitting: Vascular Surgery

## 2010-04-30 ENCOUNTER — Ambulatory Visit: Payer: Self-pay | Admitting: Oncology

## 2010-05-07 ENCOUNTER — Telehealth: Payer: Self-pay | Admitting: *Deleted

## 2010-05-15 ENCOUNTER — Ambulatory Visit (HOSPITAL_COMMUNITY)
Admission: RE | Admit: 2010-05-15 | Discharge: 2010-05-15 | Payer: Self-pay | Source: Home / Self Care | Attending: General Surgery | Admitting: General Surgery

## 2010-05-15 ENCOUNTER — Encounter (HOSPITAL_BASED_OUTPATIENT_CLINIC_OR_DEPARTMENT_OTHER)
Admission: RE | Admit: 2010-05-15 | Discharge: 2010-06-18 | Payer: Self-pay | Source: Home / Self Care | Attending: General Surgery | Admitting: General Surgery

## 2010-05-29 ENCOUNTER — Inpatient Hospital Stay (HOSPITAL_COMMUNITY)
Admission: AD | Admit: 2010-05-29 | Discharge: 2010-06-03 | Payer: Self-pay | Source: Home / Self Care | Attending: Internal Medicine | Admitting: Internal Medicine

## 2010-05-29 ENCOUNTER — Encounter: Payer: Self-pay | Admitting: Internal Medicine

## 2010-05-29 ENCOUNTER — Emergency Department (HOSPITAL_COMMUNITY)
Admission: EM | Admit: 2010-05-29 | Discharge: 2010-05-29 | Payer: Self-pay | Source: Home / Self Care | Attending: Internal Medicine | Admitting: Internal Medicine

## 2010-05-29 LAB — CONVERTED CEMR LAB: Hgb A1c MFr Bld: 7.1 %

## 2010-05-31 LAB — CONVERTED CEMR LAB
HDL: 52 mg/dL
LDL Cholesterol: 17 mg/dL

## 2010-06-03 ENCOUNTER — Ambulatory Visit: Payer: Self-pay | Admitting: Oncology

## 2010-06-03 ENCOUNTER — Encounter: Payer: Self-pay | Admitting: Internal Medicine

## 2010-06-03 LAB — GLUCOSE, CAPILLARY
Glucose-Capillary: 177 mg/dL — ABNORMAL HIGH (ref 70–99)
Glucose-Capillary: 150 mg/dL — ABNORMAL HIGH (ref 70–99)
Glucose-Capillary: 118 mg/dL — ABNORMAL HIGH (ref 70–99)
Glucose-Capillary: 97 mg/dL (ref 70–99)
Glucose-Capillary: 104 mg/dL — ABNORMAL HIGH (ref 70–99)
Glucose-Capillary: 94 mg/dL (ref 70–99)
Glucose-Capillary: 87 mg/dL (ref 70–99)
Glucose-Capillary: 96 mg/dL (ref 70–99)
Glucose-Capillary: 148 mg/dL — ABNORMAL HIGH (ref 70–99)
Glucose-Capillary: 111 mg/dL — ABNORMAL HIGH (ref 70–99)
Glucose-Capillary: 238 mg/dL — ABNORMAL HIGH (ref 70–99)
Glucose-Capillary: 166 mg/dL — ABNORMAL HIGH (ref 70–99)
Glucose-Capillary: 140 mg/dL — ABNORMAL HIGH (ref 70–99)
Glucose-Capillary: 187 mg/dL — ABNORMAL HIGH (ref 70–99)
Glucose-Capillary: 127 mg/dL — ABNORMAL HIGH (ref 70–99)
Glucose-Capillary: 173 mg/dL — ABNORMAL HIGH (ref 70–99)
Glucose-Capillary: 107 mg/dL — ABNORMAL HIGH (ref 70–99)
Glucose-Capillary: 185 mg/dL — ABNORMAL HIGH (ref 70–99)
Glucose-Capillary: 179 mg/dL — ABNORMAL HIGH (ref 70–99)
Glucose-Capillary: 180 mg/dL — ABNORMAL HIGH (ref 70–99)

## 2010-06-03 LAB — CBC
HCT: 27.6 % — ABNORMAL LOW (ref 36.0–46.0)
HCT: 24.5 % — ABNORMAL LOW (ref 36.0–46.0)
HCT: 26.7 % — ABNORMAL LOW (ref 36.0–46.0)
HCT: 25.6 % — ABNORMAL LOW (ref 36.0–46.0)
Hemoglobin: 8.8 g/dL — ABNORMAL LOW (ref 12.0–15.0)
Hemoglobin: 7.7 g/dL — ABNORMAL LOW (ref 12.0–15.0)
Hemoglobin: 8.4 g/dL — ABNORMAL LOW (ref 12.0–15.0)
Hemoglobin: 8.1 g/dL — ABNORMAL LOW (ref 12.0–15.0)
MCH: 28.2 pg (ref 26.0–34.0)
MCH: 27.2 pg (ref 26.0–34.0)
MCH: 27.5 pg (ref 26.0–34.0)
MCH: 27.7 pg (ref 26.0–34.0)
MCHC: 31.9 g/dL (ref 30.0–36.0)
MCHC: 31.4 g/dL (ref 30.0–36.0)
MCHC: 31.5 g/dL (ref 30.0–36.0)
MCHC: 31.6 g/dL (ref 30.0–36.0)
MCV: 88.5 fL (ref 78.0–100.0)
MCV: 86.6 fL (ref 78.0–100.0)
MCV: 87.3 fL (ref 78.0–100.0)
MCV: 87.7 fL (ref 78.0–100.0)
Platelets: 238 10*3/uL (ref 150–400)
Platelets: 232 10*3/uL (ref 150–400)
Platelets: 260 10*3/uL (ref 150–400)
Platelets: 247 10*3/uL (ref 150–400)
RBC: 3.12 MIL/uL — ABNORMAL LOW (ref 3.87–5.11)
RBC: 2.83 MIL/uL — ABNORMAL LOW (ref 3.87–5.11)
RBC: 3.06 MIL/uL — ABNORMAL LOW (ref 3.87–5.11)
RBC: 2.92 MIL/uL — ABNORMAL LOW (ref 3.87–5.11)
RDW: 13.3 % (ref 11.5–15.5)
RDW: 13.3 % (ref 11.5–15.5)
RDW: 13.4 % (ref 11.5–15.5)
RDW: 13.7 % (ref 11.5–15.5)
WBC: 10.8 10*3/uL — ABNORMAL HIGH (ref 4.0–10.5)
WBC: 6.8 10*3/uL (ref 4.0–10.5)
WBC: 8.5 10*3/uL (ref 4.0–10.5)
WBC: 8.3 10*3/uL (ref 4.0–10.5)

## 2010-06-03 LAB — LIPID PANEL
Cholesterol: 166 mg/dL (ref 0–200)
HDL: 52 mg/dL (ref >39)
LDL Cholesterol: 97 mg/dL (ref 0–99)
Total CHOL/HDL Ratio: 3.2 RATIO
Triglycerides: 85 mg/dL (ref <150)
VLDL: 17 mg/dL (ref 0–40)

## 2010-06-03 LAB — DIFFERENTIAL
Basophils Absolute: 0 10*3/uL (ref 0.0–0.1)
Basophils Absolute: 0 10*3/uL (ref 0.0–0.1)
Basophils Relative: 0 % (ref 0–1)
Basophils Relative: 0 % (ref 0–1)
Eosinophils Absolute: 0.1 10*3/uL (ref 0.0–0.7)
Eosinophils Absolute: 0.1 10*3/uL (ref 0.0–0.7)
Eosinophils Relative: 1 % (ref 0–5)
Eosinophils Relative: 1 % (ref 0–5)
Lymphocytes Relative: 9 % — ABNORMAL LOW (ref 12–46)
Lymphocytes Relative: 19 % (ref 12–46)
Lymphs Abs: 1 10*3/uL (ref 0.7–4.0)
Lymphs Abs: 1.3 10*3/uL (ref 0.7–4.0)
Monocytes Absolute: 1 10*3/uL (ref 0.1–1.0)
Monocytes Absolute: 0.6 10*3/uL (ref 0.1–1.0)
Monocytes Relative: 10 % (ref 3–12)
Monocytes Relative: 10 % (ref 3–12)
Neutro Abs: 8.6 10*3/uL — ABNORMAL HIGH (ref 1.7–7.7)
Neutro Abs: 4.8 10*3/uL (ref 1.7–7.7)
Neutrophils Relative %: 80 % — ABNORMAL HIGH (ref 43–77)
Neutrophils Relative %: 70 % (ref 43–77)

## 2010-06-03 LAB — POCT I-STAT, CHEM 8
BUN: 34 mg/dL — ABNORMAL HIGH (ref 6–23)
Calcium, Ion: 1.03 mmol/L — ABNORMAL LOW (ref 1.12–1.32)
Chloride: 103 mEq/L (ref 96–112)
Creatinine, Ser: 2.1 mg/dL — ABNORMAL HIGH (ref 0.4–1.2)
Glucose, Bld: 120 mg/dL — ABNORMAL HIGH (ref 70–99)
HCT: 28 % — ABNORMAL LOW (ref 36.0–46.0)
Hemoglobin: 9.5 g/dL — ABNORMAL LOW (ref 12.0–15.0)
Potassium: 4.6 mEq/L (ref 3.5–5.1)
Sodium: 134 mEq/L — ABNORMAL LOW (ref 135–145)
TCO2: 24 mmol/L (ref 0–100)

## 2010-06-03 LAB — IRON AND TIBC
Iron: 11 ug/dL — ABNORMAL LOW (ref 42–135)
Saturation Ratios: 9 % — ABNORMAL LOW (ref 20–55)
TIBC: 123 ug/dL — ABNORMAL LOW (ref 250–470)
UIBC: 112 ug/dL

## 2010-06-03 LAB — COMPREHENSIVE METABOLIC PANEL
ALT: <8 U/L (ref 0–35)
AST: 20 U/L (ref 0–37)
Albumin: 2.2 g/dL — ABNORMAL LOW (ref 3.5–5.2)
Alkaline Phosphatase: 58 U/L (ref 39–117)
BUN: 17 mg/dL (ref 6–23)
CO2: 24 mEq/L (ref 19–32)
Calcium: 8.5 mg/dL (ref 8.4–10.5)
Chloride: 105 mEq/L (ref 96–112)
Creatinine, Ser: 1.66 mg/dL — ABNORMAL HIGH (ref 0.4–1.2)
GFR calc Af Amer: 37 mL/min — ABNORMAL LOW (ref >60)
GFR calc non Af Amer: 30 mL/min — ABNORMAL LOW (ref >60)
Glucose, Bld: 100 mg/dL — ABNORMAL HIGH (ref 70–99)
Potassium: 3.7 mEq/L (ref 3.5–5.1)
Sodium: 137 mEq/L (ref 135–145)
Total Bilirubin: 0.5 mg/dL (ref 0.3–1.2)
Total Protein: 6.4 g/dL (ref 6.0–8.3)

## 2010-06-03 LAB — HEMOGLOBIN A1C
Hgb A1c MFr Bld: 7.1 % — ABNORMAL HIGH (ref <5.7)
Mean Plasma Glucose: 157 mg/dL — ABNORMAL HIGH (ref <117)

## 2010-06-03 LAB — FOLATE: Folate: 7.8 ng/mL

## 2010-06-03 LAB — MRSA PCR SCREENING: MRSA by PCR: NEGATIVE

## 2010-06-03 LAB — T4, FREE: Free T4: 1.53 ng/dL (ref 0.80–1.80)

## 2010-06-03 LAB — FERRITIN: Ferritin: 360 ng/mL — ABNORMAL HIGH (ref 10–291)

## 2010-06-03 LAB — TSH: TSH: 10.583 u[IU]/mL — ABNORMAL HIGH (ref 0.350–4.500)

## 2010-06-03 LAB — VITAMIN B12: Vitamin B-12: 1158 pg/mL — ABNORMAL HIGH (ref 211–911)

## 2010-06-03 LAB — T3, FREE: T3, Free: 1.4 pg/mL — ABNORMAL LOW (ref 2.3–4.2)

## 2010-06-05 LAB — GLUCOSE, CAPILLARY
Glucose-Capillary: 77 mg/dL (ref 70–99)
Glucose-Capillary: 161 mg/dL — ABNORMAL HIGH (ref 70–99)

## 2010-06-05 LAB — CBC
HCT: 21.5 % — ABNORMAL LOW (ref 36.0–46.0)
Hemoglobin: 6.9 g/dL — CL (ref 12.0–15.0)
MCH: 28 pg (ref 26.0–34.0)
MCHC: 32.1 g/dL (ref 30.0–36.0)
MCV: 87.4 fL (ref 78.0–100.0)
Platelets: 216 10*3/uL (ref 150–400)
RBC: 2.46 MIL/uL — ABNORMAL LOW (ref 3.87–5.11)
RDW: 13.9 % (ref 11.5–15.5)
WBC: 7.7 10*3/uL (ref 4.0–10.5)

## 2010-06-10 ENCOUNTER — Encounter: Payer: Self-pay | Admitting: Internal Medicine

## 2010-06-11 ENCOUNTER — Ambulatory Visit: Admission: RE | Admit: 2010-06-11 | Discharge: 2010-06-11 | Payer: Self-pay | Source: Home / Self Care

## 2010-06-11 LAB — CONVERTED CEMR LAB
Basophils Absolute: 0 10*3/uL (ref 0.0–0.1)
Blood Glucose, Fingerstick: 119
CO2: 24 meq/L (ref 19–32)
Chloride: 106 meq/L (ref 96–112)
Hemoglobin: 7.8 g/dL — ABNORMAL LOW (ref 12.0–15.0)
Lymphocytes Relative: 18 % (ref 12–46)
Neutro Abs: 4.9 10*3/uL (ref 1.7–7.7)
Platelets: 203 10*3/uL (ref 150–400)
RDW: 15.3 % (ref 11.5–15.5)
Sodium: 140 meq/L (ref 135–145)
WBC: 7.5 10*3/uL (ref 4.0–10.5)

## 2010-06-18 NOTE — Discharge Summary (Signed)
Summary: Hospital Discharge Update (osteomylitis s/p toe amputation)    Hospital Discharge Update:  Date of Admission: 08/31/2009 Date of Discharge: 09/05/2009  Brief Summary:  Rt toe osteomylitis- s/p ampuatatiom. No Abx at discharge  Labs needed at follow-up: Basic metabolic panel  Other follow-up issues:  Has diabetic ulcer on Left plantar surface requiring regular wound care Bp running high, added norvasc and increased lisinopril Mild bump in creatinine with increased lisinopril, so stopped lasix Continued metoprolol for post op mortality reduction, was getting bradycardic tp HR 50-60  Problem list changes:  Added new problem of OSTEOMYELITIS (ICD-730.20) - Signed  Medication list changes:  Removed medication of FUROSEMIDE 40 MG TABS (FUROSEMIDE) Take 1 tablet by mouth once a day - Signed Added new medication of AMLODIPINE BESYLATE 10 MG TABS (AMLODIPINE BESYLATE) Take 1 tablet by mouth once a day - Signed Changed medication from LISINOPRIL 10 MG TABS (LISINOPRIL) Take 1 tablet by mouth once a day to LISINOPRIL 40 MG TABS (LISINOPRIL) Take 1 tablet by mouth once a day - Signed Rx of AMLODIPINE BESYLATE 10 MG TABS (AMLODIPINE BESYLATE) Take 1 tablet by mouth once a day;  #30 x 0;  Signed;  Entered by: Bethel Born MD;  Authorized by: Bethel Born MD;  Method used: Electronically to [facility] [id number], [place], [place], [place]  [address], Ph: [phone], Fax: [phone] Rx of LISINOPRIL 40 MG TABS (LISINOPRIL) Take 1 tablet by mouth once a day;  #30 x 0;  Signed;  Entered by: Bethel Born MD;  Authorized by: Bethel Born MD;  Method used: Handwritten  The medication, problem, and allergy lists have been updated.  Please see the dictated discharge summary for details.  Discharge medications:  GLIPIZIDE 10 MG TABS (GLIPIZIDE) Take 1 tablet by mouth two times a day LISINOPRIL 40 MG TABS (LISINOPRIL) Take 1 tablet by mouth once a day JANUVIA 100 MG  TABS (SITAGLIPTIN PHOSPHATE) Take 1 tablet by mouth once a day METOPROLOL TARTRATE 50 MG  TABS (METOPROLOL TARTRATE) Take 1 tablet by mouth two times a day SYNTHROID 112 MCG  TABS (LEVOTHYROXINE SODIUM) Take 1 tablet by mouth once a day PRAVACHOL 20 MG TABS (PRAVASTATIN SODIUM) Take 1 tab by mouth at bedtime ASPIRIN 81 MG TABS (ASPIRIN) Take 1 tablet by mouth once a day LANCETS ULTRA THIN  MISC (LANCETS) Use the lancets to check your Blood sugar three times daily. AMLODIPINE BESYLATE 10 MG TABS (AMLODIPINE BESYLATE) Take 1 tablet by mouth once a day  Other patient instructions:  Your next appointment [facility] is on 09/12/2009 at 1:50 pm Get your new prescription from [facility] on [address]   Note: Hospital Discharge Medications & Other Instructions handout was printed, one copy for patient and a second copy to be placed in hospital chart.  Prescriptions: LISINOPRIL 40 MG TABS (LISINOPRIL) Take 1 tablet by mouth once a day  #30 x 0   Entered and Authorized by:   Bethel Born MD   Signed by:   Bethel Born MD on 09/05/2009   Method used:   Handwritten   RxID:   [id number] AMLODIPINE BESYLATE 10 MG TABS (AMLODIPINE BESYLATE) Take 1 tablet by mouth once a day  #30 x 0   Entered and Authorized by:   Bethel Born MD   Signed by:   Bethel Born MD on 09/05/2009   Method used:   Electronically to        [facility]  [id number] (retail)       [address] [place], [place]  [address]       Ph: [phone]       Fax: [phone]   RxID:   [id number]

## 2010-06-18 NOTE — Progress Notes (Signed)
Summary: refill/ hla  Phone Note Refill Request Message from:  Patient on December 26, 2009 5:27 PM  Refills Requested: Medication #1:  JANUVIA 100 MG TABS Take 1 tablet by mouth once a day   Dosage confirmed as above?Dosage Confirmed   Last Refilled: 11/20/2009 Pt is out of this med   Method Requested: Electronic Initial call taken by: Marin Roberts RN,  December 26, 2009 5:27 PM  Follow-up for Phone Call        Refill approved-nurse to complete    Prescriptions: JANUVIA 100 MG TABS (SITAGLIPTIN PHOSPHATE) Take 1 tablet by mouth once a day  #30 x 3   Entered by:   Zoila Shutter MD   Authorized by:   Vassie Loll MD   Signed by:   Zoila Shutter MD on 12/27/2009   Method used:   Electronically to        [facility] [id number] (retail)       [address] [place], [place]  [address]       Ph: [phone]       Fax: [phone]   RxID:   [id number]

## 2010-06-18 NOTE — Consult Note (Signed)
[facility]: [facility]   Imported By: Florinda Marker 06/14/2009 11:45:22  _____________________________________________________________________  External Attachment:    Type:   Image     Comment:   External Document

## 2010-06-18 NOTE — Progress Notes (Signed)
Summary: Infected Toe  Phone Note Call from Patient   Caller: Patient Call For: Stacy Loll MD Summary of Call: Call from pt requesting an antibiotic.  Says that her toe is infected.  Toe has a wound that  is draining blood tinged .  Toe is turning dark.  Has had this in the past.  Orthopeadic appointment on 09/10/2009.  Has been having trouble with the toe and foot for about a week.  Does not have a fever as of yet.  Has a wound on both feet.   Sugars have been running about 190 fasting.  Is able to move around.  Has been to the [facility] before with another wound.   Has had the toe amputated.  Top of toe is turning black.  Said that she stays cold most of the time anyway.  Pt was advised to get to the ER today since she is a Diabetic and this has occurred before.  Pt said that she is not having any fever at this time.  Pt said that she is going to call her daughjter and have her take her to the ER today.  Angelina Ok RN  August 31, 2009 2:23 PM Pt was called and says that she contacted her daughter who is coming to pick her up to take her to the ER. Angelina Ok RN  August 31, 2009 2:54 PM  Initial call taken by: Angelina Ok RN,  August 31, 2009 2:23 PM Call placed by: Angelina Ok RN,  August 31, 2009 2:14 PM  Follow-up for Phone Call        Agree she needs to be seen given the concern for a necrotic toe.  We had an opening at 3 PM to see her in the clinic but I guess transportation was an issue to get her in before the end of the open slot. Follow-up by: Doneen Poisson MD,  August 31, 2009 2:51 PM

## 2010-06-18 NOTE — Progress Notes (Signed)
Summary: Refill/gh  Phone Note Refill Request Message from:  Fax from Pharmacy on January 07, 2010 1:57 PM  Refills Requested: Medication #1:  GLIPIZIDE 10 MG TABS Take 1 tablet by mouth two times a day   Last Refilled: 12/02/2009  Method Requested: Electronic Initial call taken by: Angelina Ok RN,  January 07, 2010 1:57 PM

## 2010-06-18 NOTE — Progress Notes (Signed)
Summary: REfill/gh  Phone Note Refill Request Message from:  Fax from Pharmacy on October 26, 2009 2:40 PM  Refills Requested: Medication #1:  LANCETS ULTRA THIN  MISC Use the lancets to check your Blood sugar three times daily.  Method Requested: Electronic Initial call taken by: Angelina Ok RN,  October 26, 2009 2:40 PM  Follow-up for Phone Call        Refill approved-nurse to complete    Prescriptions: LANCETS ULTRA THIN  MISC (LANCETS) Use the lancets to check your Blood sugar three times daily.  #270 x 8   Entered and Authorized by:   Vassie Loll MD   Signed by:   Vassie Loll MD on 10/26/2009   Method used:   Telephoned to ...       [facility] education [facility])       [address]       [place], [place]  [address]       Ph: [phone]       Fax: [phone]   RxID:   [id number]

## 2010-06-18 NOTE — Initial Assessments (Signed)
Summary: HOSPITAL ADMISSION  INTERNAL MEDICINE ADMISSION HISTORY AND PHYSICAL   PCP: Dr. Vassie Loll  CC:  HPI: 76 y/o woman with pmh significant for DM with multiple episodes of foot ulcers and cellulitis presents to the ED for worsening foot pain. Hx obtained from patient. Pt states worsening swelling and pain of the 2nd right toe for a few days. She is seen by wound care regularly, and was to see wound care this upcoming Monday April 18th, 2011. Patient has had extensive hx of foot ulcers and amputations with last amputation of right foot third toe in Feb 2010 by Dr. Magnus Ivan. Patient states her last wound care visit was approximately a few months back in 2010 and her last visit with the orthopedist was in Jan 2011 who advised that patient should keep her wounds clean, ointments and dressing. Pt states she noticed her wound to have progressed in severity over the last week, and primarily came to ED for abx. She states she did notice some blood from the wound area. No fevers, chills, CP, palpitations, abdominal pain, N/V, diaphoresis, and no urinary complaints or concerns. At baseline pt is ambulatory and walks with a walking stick. Of note patient also has an ulcer located on the plantar surface of her left foot. Pt states that ulcer is chronic.     ALLERGIES:  NKDA  PAST MEDICAL HISTORY:  Anemia- B12 deficiency on B12 IM shots Benign multinodular goiter- s/p total thyroidectomy 03/13/2006 per Dr Derrell Lolling Hypothyroidism - Surgically induced Asthma Diabetes mellitus, type II with last A1C 6.7 (March 2010) Hyperlipidemia Hypertension Pneumonia, hx of Atrial fibrillation Renal insufficiency baseline Cr 1.3-2.0  Recurrent  Right pleural effusion -S/P VATS and pleurodesis 01/2005 IgA monoclonal gammopathy of uncertain significance - per Dr Arline Asp Deconditioning Seasonal allergies Cataracts Gastritis and superficial ulcers per EGD 12/2004 Spongiotic psoriasiform dermatitis  01/2006 Venous insufficiency 2D Echo done in 2008 with EF of 55-65%   MEDICATIONS  GLIPIZIDE 10 MG TABS (GLIPIZIDE) Take 1 tablet by mouth two times a day LISINOPRIL 10 MG TABS (LISINOPRIL) Take 1 tablet by mouth once a day JANUVIA 100 MG TABS (SITAGLIPTIN PHOSPHATE) Take 1 tablet by mouth once a day METOPROLOL TARTRATE 50 MG  TABS (METOPROLOL TARTRATE) Take 1 tablet by mouth two times a day SYNTHROID 112 MCG  TABS (LEVOTHYROXINE SODIUM) Take 1 tablet by mouth once a day PRAVACHOL 20 MG TABS (PRAVASTATIN SODIUM) Take 1 tab by mouth at bedtime ASPIRIN 81 MG TABS (ASPIRIN) Take 1 tablet by mouth once a day FUROSEMIDE 40 MG TABS (FUROSEMIDE) Take 1 tablet by mouth once a day LANCETS ULTRA THIN  MISC (LANCETS) Use the lancets to check your Blood sugar three times daily.   SOCIAL HISTORY  Lives in [place] by herself, No tobacco, no ETOH USE, no IVDU  FAMILY HISTORY  Mother deceased from rectal cancer. Father deceased from MI 1 sister deceased from coma s/p neck surgery.  2 brothers deceased, 1 from bone cancer and other from lung cancer. 1 living brother healthy. 3 children- 1 daughter ( DMII), 2 sons healthy.   ROS  VITALS  T: 98.7 P: 67 BP:146/44  R: 16 O2SAT:99%  ON RA  PHYSICAL EXAM  Gen: Patient is in NAD, Pleasant. Eyes: PERRL, EOMI, No signs of anemia or jaundince. ENT: MMM, OP clear, No erythema, thrush or exudates. Neck: Supple, No carotid Bruits, No JVD, No thyromegaly Resp: CTA- Bilaterally, No W/C/R. CVS: S1S2 RRR, No M/R/G GI: Abdomen is soft. ND, NT, NG, NR, BS+.  No organomegaly. Ext: Ulcer located 2nd right toe, purulent drainage from wound, foul odor from site, warmth to the touch, erthythematous, dark pigmentation over dorsum of right foot, Ulcer also located over left plantar surface of foot, stage 3 diabetic foot ulcer MS: Moving all 4 extremities. Neuro: A&O X3, CN II - XII are grossly intact. Motor strength is 5/5 in the all 4 extremities, Sensations  intact to light touch Psych: Appropriate    LABS   Right Foot Xray  New destruction of the distal phalanx of the right second toe, most compatible with osteomyelitis in the absence of any recent intervention to this area.  CBC    WBC                                      10.8       h      4.0-10.5         K/uL  RBC                                      3.43       l      3.87-5.11        MIL/uL  Hemoglobin (HGB)                         9.8        l      12.0-15.0        g/dL  Hematocrit (HCT)                         29.4       l      36.0-46.0        %  MCV                                      85.6              78.0-100.0       fL  MCHC                                     33.5              30.0-36.0        g/dL  RDW                                      13.9              11.5-15.5        %  Platelet Count (PLT)                     170               150-400          K/uL  Neutrophils, %                           78  h      43-77            %  Lymphocytes, %                           11         l      12-46            %  Monocytes, %                             10                3-12             %  Eosinophils, %                           1                 0-5              %  Basophils, %                             0                 0-1              %  Neutrophils, Absolute                    8.4        h      1.7-7.7          K/uL  Lymphocytes, Absolute                    1.2               0.7-4.0          K/uL  Monocytes, Absolute                      1.1        h      0.1-1.0          K/uL  Eosinophils, Absolute                    0.1               0.0-0.7          K/uL  Basophils, Absolute                      0.0               0.0-0.1          K/uL  I-STAT   TCO2                                     25                0-100            mmol/L  Ionized Calcium  1.04       l      1.12-1.32        mmol/L  Hemoglobin (HGB)                         10.5        l      12.0-15.0        g/dL  Hematocrit (HCT)                         31.0       l      36.0-46.0        %  Sodium (NA)                              135               135-145          mEq/L  Potassium (K)                            4.1               3.5-5.1          mEq/L  Chloride                                 102               96-112           mEq/L  Glucose                                  162        h      70-99            mg/dL  BUN                                      32         h      6-23             mg/dL  Creatinine                               1.8        h      0.4-1.2          mg/dL   IMPRESSION:   New destruction of the distal phalanx of the right second toe, most   compatible with osteomyelitis in the absence of any recent   intervention to this area.   ASSESSMENT AND PLAN  1) Osteomyelitis of 2nd right toe - Pt has extensive hx of diabetic foot ulcers that result in osteomyelitis and subsequently require amputation. Pt also has severe PVD and decreased arterial flow more prominent in right foot, as noted by ABI. Pt does not appear acutely ill, as there is no elevation in white count, and no fever. Plan to start pt on IV abx (Vanc and Ceftaz) and will consult ortho in am. Will also obtain  blood cx x 2. Will also consult vascular in am for possible arteriogram. Suspect severity in foot ulcers is likely to severe PVD as well as DM. Will also check xray of left foot to evaluate left plantar foot ulcer and get wound care consult.    2) Chronic renal failure - Will give pt gentle hydration with NS at 75 cc/hr and continue to monitor serial Cr.   3) Anemia - Hgb baseline between 9-10. Anemia panel was checked in 2008 revealing a pattern more consistent with ACD. Will recheck anemia panel.   4) Hypothyroidism - Will check TSH and resume home dose of synthroid.  5) HLD - FLP from 07/2008 revealing elevated total cholesterol of 220, LDL of 122. Will check FLP and resume statin.    6) HTN - Mildly elevated, will resume home meds with the exception of Lasix and continue to monitor.   7) DM - Last A1C 6.7 (07/2008). Will check A1C and start SSI.    I performed and/or observed a history and physical examination of the patient.  I discussed the case with the residents as noted and reviewed the residents' notes.  I agree with the findings and plan--please refer to the attending physician note for more details.   Signature   Printed Name

## 2010-06-18 NOTE — Progress Notes (Signed)
Summary: refill/ hla  Phone Note Refill Request Message from:  Fax from Pharmacy on August 14, 2009 3:42 PM  Refills Requested: Medication #1:  GLIPIZIDE 10 MG TABS Take 1 tablet by mouth two times a day   Last Refilled: 2/23  Medication #2:  JANUVIA 100 MG TABS Take 1 tablet by mouth once a day   Last Refilled: 2/23 Initial call taken by: Marin Roberts RN,  August 14, 2009 3:42 PM  Follow-up for Phone Call        Refill approved-nurse to complete.  Please make sure patient have a followup appointmnet either with me or with anyone else at the clinic; she had aprox/ 1 year w/o followup.    Prescriptions: JANUVIA 100 MG TABS (SITAGLIPTIN PHOSPHATE) Take 1 tablet by mouth once a day  #30 x 3   Entered and Authorized by:   Vassie Loll MD   Signed by:   Vassie Loll MD on 08/14/2009   Method used:   Electronically to        [facility] [id number] (retail)       [address] [place], [place]  [address]       Ph: [phone]       Fax: [phone]   RxID:   [id number] GLIPIZIDE 10 MG TABS (GLIPIZIDE) Take 1 tablet by mouth two times a day  #60 x 3   Entered and Authorized by:   Vassie Loll MD   Signed by:   Vassie Loll MD on 08/14/2009   Method used:   Electronically to        [facility] [id number] (retail)       [address]       [place], [place]  [address]       Ph: [phone]       Fax: [phone]   RxID:   [id number]

## 2010-06-18 NOTE — Progress Notes (Signed)
Summary: med refill/gp  Phone Note Refill Request Message from:  Fax from Pharmacy on March 08, 2010 10:48 AM  Refills Requested: Medication #1:  METOPROLOL TARTRATE 25 MG TABS Take 1 tablet by mouth two times a day   Last Refilled: 01/16/2010 Last appt. 09/19/09.   Method Requested: Electronic Initial call taken by: Chinita Pester RN,  March 08, 2010 10:48 AM  Follow-up for Phone Call        Refill approved-nurse to complete    Prescriptions: METOPROLOL TARTRATE 25 MG TABS (METOPROLOL TARTRATE) Take 1 tablet by mouth two times a day  #62 x 4   Entered and Authorized by:   Vassie Loll MD   Signed by:   Vassie Loll MD on 03/08/2010   Method used:   Electronically to        [facility] [id number] (retail)       [address]       [place], [place]  [address]       Ph: [phone]       Fax: [phone]   RxID:   [id number]

## 2010-06-18 NOTE — Progress Notes (Signed)
Summary: Refill/gh  Phone Note Refill Request Message from:  Fax from Pharmacy on January 07, 2010 2:34 PM  Refills Requested: Medication #1:  GLIPIZIDE 10 MG TABS Take 1 tablet by mouth two times a day   Last Refilled: 12/02/2009  Method Requested: Electronic Initial call taken by: Angelina Ok RN,  January 07, 2010 2:34 PM  Follow-up for Phone Call        Refill approved-nurse to complete    Prescriptions: GLIPIZIDE 10 MG TABS (GLIPIZIDE) Take 1 tablet by mouth two times a day  #60 x 4   Entered and Authorized by:   Vassie Loll MD   Signed by:   Vassie Loll MD on 01/08/2010   Method used:   Electronically to        [facility] [id number] (retail)       [address] [place], [place]  [address]       Ph: [phone]       Fax: [phone]   RxID:   [id number]

## 2010-06-18 NOTE — Progress Notes (Signed)
Summary: refill/gg  Phone Note Refill Request  on November 21, 2009 11:59 AM  Refills Requested: Medication #1:  SYNTHROID 112 MCG  TABS Take 1 tablet by mouth once a day   Last Refilled: 10/13/2009  Method Requested: Electronic Initial call taken by: Merrie Roof RN,  November 21, 2009 11:59 AM  Follow-up for Phone Call        Refilled electronically.  Follow-up by: Margarito Liner MD,  November 21, 2009 4:39 PM    Prescriptions: SYNTHROID 112 MCG  TABS (LEVOTHYROXINE SODIUM) Take 1 tablet by mouth once a day  #30 x 1   Entered and Authorized by:   Margarito Liner MD   Signed by:   Margarito Liner MD on 11/21/2009   Method used:   Electronically to        [facility] [id number] (retail)       [address] [place], [place]  [address]       Ph: [phone]       Fax: [phone]   RxID:   [id number]

## 2010-06-18 NOTE — Assessment & Plan Note (Signed)
Summary: HFU/SB.   Vital Signs:  Patient profile:   76 year old female Height:      68 inches (172.72 cm) Weight:      200.01 pounds (90.91 kg) BMI:     30.52 Temp:     98 degrees F (36.67 degrees C) oral Pulse rate:   68 / minute BP sitting:   172 / 83  (right arm)  Vitals Entered By: Angelina Ok RN (Sep 19, 2009 2:26 PM) Is Patient Diabetic? Yes Did you bring your meter with you today? No Pain Assessment Patient in pain? no      Nutritional Status BMI of > 30 = obese CBG Result 101  Have you ever been in a relationship where you felt threatened, hurt or afraid?No   Does patient need assistance? Functional Status Self care Ambulation Impaired:Risk for fall Comments HFU.  Uses a cane to ambulate.  Needs refills on meds.  Has some questions about.  Appointment next week with the Surgeon.  Just stopped taking her Lasix after been discharge.   Primary Care Provider:  Peggye Pitt MD   History of Present Illness: 76 y/o female with pmh as described on the EMR.Who comes tothe clinic for HFU after been admitted secondary to diabetic foot ulcer with 2nd right toe osteomylitis (s/p Amputation). Patient reports doing great, denies any pain, drainage, fever,  or problem ambulating.  Patient SBP in the 170's range today. She has been out of her metoprolol for 1 week now and her Lasix was stopped at the moment of discharge due to increased Cr.  She is compliant with her medications and at this point denies any further complaints.    Depression History:      The patient denies a depressed mood most of the day and a diminished interest in her usual daily activities.        The patient denies that she feels like life is not worth living, denies that she wishes that she were dead, and denies that she has thought about ending her life.         Preventive Screening-Counseling & Management  Alcohol-Tobacco     Alcohol drinks/day: 0     Smoking Status: never  Problems Prior to  Update: 1)  Osteomyelitis  (ICD-730.20) 2)  Osteomyelitis, Acute, Ankle/foot  (ICD-730.07) 3)  Dyschromia, Unspecified  (ICD-709.00) 4)  Health Maintenance Exam  (ICD-V70.0) 5)  Hypothyroidism  (ICD-244.9) 6)  Diabetes-type 2  (ICD-250.00) 7)  Renal Insufficiency  (ICD-588.9) 8)  Edema Leg  (ICD-782.3) 9)  Chest Pain  (ICD-786.50) 10)  Status, Other Toe(S) Amputation  (ICD-V49.72) 11)  Anemia, Vitamin B12 Deficiency  (ICD-281.1) 12)  Venous Insufficiency  (ICD-459.81) 13)  Hypertension  (ICD-401.9) 14)  Hyperlipidemia  (ICD-272.4)  Current Problems (verified): 1)  Osteomyelitis  (ICD-730.20) 2)  Osteomyelitis, Acute, Ankle/foot  (ICD-730.07) 3)  Dyschromia, Unspecified  (ICD-709.00) 4)  Health Maintenance Exam  (ICD-V70.0) 5)  Hypothyroidism  (ICD-244.9) 6)  Diabetes-type 2  (ICD-250.00) 7)  Renal Insufficiency  (ICD-588.9) 8)  Edema Leg  (ICD-782.3) 9)  Chest Pain  (ICD-786.50) 10)  Status, Other Toe(S) Amputation  (ICD-V49.72) 11)  Anemia, Vitamin B12 Deficiency  (ICD-281.1) 12)  Venous Insufficiency  (ICD-459.81) 13)  Hypertension  (ICD-401.9) 14)  Hyperlipidemia  (ICD-272.4)  Medications Prior to Update: 1)  Glipizide 10 Mg Tabs (Glipizide) .... Take 1 Tablet By Mouth Two Times A Day 2)  Lisinopril 40 Mg Tabs (Lisinopril) .... Take 1 Tablet By Mouth Once A  Day 3)  Januvia 100 Mg Tabs (Sitagliptin Phosphate) .... Take 1 Tablet By Mouth Once A Day 4)  Metoprolol Tartrate 50 Mg  Tabs (Metoprolol Tartrate) .... Take 1 Tablet By Mouth Two Times A Day 5)  Synthroid 112 Mcg  Tabs (Levothyroxine Sodium) .... Take 1 Tablet By Mouth Once A Day 6)  Pravachol 20 Mg Tabs (Pravastatin Sodium) .... Take 1 Tab By Mouth At Bedtime 7)  Aspirin 81 Mg Tabs (Aspirin) .... Take 1 Tablet By Mouth Once A Day 8)  Lancets Ultra Thin  Misc (Lancets) .... Use The Lancets To Check Your Blood Sugar Three Times Daily. 9)  Amlodipine Besylate 10 Mg Tabs (Amlodipine Besylate) .... Take 1 Tablet By Mouth  Once A Day  Current Medications (verified): 1)  Glipizide 10 Mg Tabs (Glipizide) .... Take 1 Tablet By Mouth Two Times A Day 2)  Lisinopril 40 Mg Tabs (Lisinopril) .... Take 1 Tablet By Mouth Once A Day 3)  Januvia 100 Mg Tabs (Sitagliptin Phosphate) .... Take 1 Tablet By Mouth Once A Day 4)  Metoprolol Tartrate 50 Mg  Tabs (Metoprolol Tartrate) .... Take 1 Tablet By Mouth Two Times A Day 5)  Synthroid 112 Mcg  Tabs (Levothyroxine Sodium) .... Take 1 Tablet By Mouth Once A Day 6)  Pravachol 20 Mg Tabs (Pravastatin Sodium) .... Take 1 Tab By Mouth At Bedtime 7)  Aspirin 81 Mg Tabs (Aspirin) .... Take 1 Tablet By Mouth Once A Day 8)  Lancets Ultra Thin  Misc (Lancets) .... Use The Lancets To Check Your Blood Sugar Three Times Daily. 9)  Amlodipine Besylate 10 Mg Tabs (Amlodipine Besylate) .... Take 1 Tablet By Mouth Once A Day  Allergies (verified): No Known Drug Allergies  Past History:  Past Medical History: Last updated: 08/04/2006 Anemia- B12 deficiency on B12 IM shots Benign multinodular goiter- s/p total thyroidectomy 03/13/2006 per Dr Derrell Lolling Hypothyroidism - Surgically induced Asthma Diabetes mellitus, type II Hyperlipidemia Hypertension Pneumonia, hx of Atrial fibrillation Renal insufficiency Recurrent  Right pleural effusion -S/P VATS and pleurodesis 01/2005 IgA monoclonal gammopathy of uncertain significance - per Dr Arline Asp Deconditioning Seasonal allergies Cataracts Gastritis and superficial ulcers per EGD 12/2004 Spongiotic psoriasiform dermatitis 01/2006 Venous insufficiency  Family History: Last updated: Sep 17, 2006 Mother deceased from rectal cancer. Father deceased from MI 1 sister deceased from coma s/p neck surgery.  2 brothers deceased, 1 from bone cancer and other from lung cancer. 1 living brother healthy. 3 children- 1 daughter ( DMII), 2 sons healthy.  Social History: Last updated: 02/18/2007 Lives in [place] by herself, No tobacco, no ETOH USE,  no IVDU  Risk Factors: Alcohol Use: 0 (09/19/2009) Exercise: yes (07/17/2006)  Risk Factors: Smoking Status: never (09/19/2009)  Review of Systems       As per HPi.  Physical Exam  General:  alert, well-developed, well-nourished, well-hydrated, and overweight-appearing.   Lungs:  Normal respiratory effort, chest expands symmetrically. Lungs are clear to auscultation, no crackles or wheezes. Heart:  normal rate, regular rhythm, no gallop, and no JVD.  Positive SEM.  Abdomen:  Bowel sounds positive,abdomen soft and non-tender without masses, organomegaly or hernias noted. Extremities:  1+ edema bilaterally, no cyanosis. She has toenails onychomychosis and is s/p amputation of her 2nd right toe with bandage in place, clean wound,no erythema, no drainage and no bleeding. Neurologic:  alert & oriented X3, cranial nerves II-XII intact, and strength normal in all extremities. Decreasd sensation on her LE especially on her feet.   Impression & Recommendations:  Problem #  1:  OSTEOMYELITIS (ICD-730.20) S/P amputation. No sings of infection during Pe; wound healing properly.Patient would follow with surgeon on May 9,for stiches removal. Will continue close monitorization of her feet.  Problem # 2:  DIABETES-TYPE 2 (ICD-250.00) Most recent A1C 7.4;Will continue current regimen; will encourage her to follow a low carb diet and will follow A1C in 3 months.  Her updated medication list for this problem includes:    Glipizide 10 Mg Tabs (Glipizide) .Marland Kitchen... Take 1 tablet by mouth two times a day    Lisinopril 40 Mg Tabs (Lisinopril) .Marland Kitchen... Take 1 tablet by mouth once a day    Januvia 100 Mg Tabs (Sitagliptin phosphate) .Marland Kitchen... Take 1 tablet by mouth once a day    Aspirin 81 Mg Tabs (Aspirin) .Marland Kitchen... Take 1 tablet by mouth once a day  Problem # 3:  HYPERTENSION (ICD-401.9) not at goal.Willcontinue her lisinopril, metorpolol, amlodipine and if Crback to baseline also her lasix for better  control.Patient advised to follow a low sodium diet and to be compliant with her medications.  Her updated medication list for this problem includes:    Lisinopril 40 Mg Tabs (Lisinopril) .Marland Kitchen... Take 1 tablet by mouth once a day    Metoprolol Tartrate 25 Mg Tabs (Metoprolol tartrate) .Marland Kitchen... Take 1 tablet by mouth two times a day    Amlodipine Besylate 10 Mg Tabs (Amlodipine besylate) .Marland Kitchen... Take 1 tablet by mouth once a day  Orders: T-Comprehensive Metabolic Panel ([id number])  Problem # 4:  HYPERLIPIDEMIA (ICD-272.4) LDL of 81. Willcontinue currenthypolipemic regimen ad will encourage patient to follow a low fat diet.  Her updated medication list for this problem includes:    Pravachol 20 Mg Tabs (Pravastatin sodium) .Marland Kitchen... Take 1 tab by mouth at bedtime  Labs Reviewed: SGOT: 12 (08/03/2008)   SGPT: <8 U/L (08/03/2008)   HDL:52 (08/31/2009), 82 (08/03/2008)  LDL:81 (08/31/2009), 122 (11/16/1599)  Chol:145 (08/31/2009), 220 (08/03/2008)  Trig:62 (08/31/2009), 80 (08/03/2008)  Problem # 5:  HYPOTHYROIDISM (ICD-244.9) No signs or symptomsof hypothyroidism at this moment.willcontinue synthroid same dose.TSH was check while she was in the hospitla and was normal.  Her updated medication list for this problem includes:    Synthroid 112 Mcg Tabs (Levothyroxine sodium) .Marland Kitchen... Take 1 tablet by mouth once a day  Problem # 6:  RENAL INSUFFICIENCY (ICD-588.9) Will check her Cr level for evaluation of renal function with a CMET. Depending labs findings will adjust her currrent mediations and their doses.   Complete Medication List: 1)  Glipizide 10 Mg Tabs (Glipizide) .... Take 1 tablet by mouth two times a day 2)  Lisinopril 40 Mg Tabs (Lisinopril) .... Take 1 tablet by mouth once a day 3)  Januvia 100 Mg Tabs (Sitagliptin phosphate) .... Take 1 tablet by mouth once a day 4)  Metoprolol Tartrate 25 Mg Tabs (Metoprolol tartrate) .... Take 1 tablet by mouth two times a day 5)  Synthroid 112 Mcg Tabs  (Levothyroxine sodium) .... Take 1 tablet by mouth once a day 6)  Pravachol 20 Mg Tabs (Pravastatin sodium) .... Take 1 tab by mouth at bedtime 7)  Aspirin 81 Mg Tabs (Aspirin) .... Take 1 tablet by mouth once a day 8)  Lancets Ultra Thin Misc (Lancets) .... Use the lancets to check your blood sugar three times daily. 9)  Amlodipine Besylate 10 Mg Tabs (Amlodipine besylate) .... Take 1 tablet by mouth once a day  Other Orders: Capillary Blood Glucose/CBG ([id number])  Patient Instructions: 1)  Please schedule a follow-up appointment  in 3 months. 2)  Take your medications as prescribed. 3)  Follow a low sodium diet ( less than 2500 mg daily) 4)  follow with your surgery appointment on May 9 for followup on your amputation and stiches removal. 5)  You will be called with any abnormalities in the tests scheduled or performed today.  If you don't hear from [place] within a week from when the test was performed, you can assume that your test was normal. Prescriptions: METOPROLOL TARTRATE 25 MG TABS (METOPROLOL TARTRATE) Take 1 tablet by mouth two times a day  #62 x 3   Entered and Authorized by:   Vassie Loll MD   Signed by:   Vassie Loll MD on 09/19/2009   Method used:   Electronically to        [facility] [id number] (retail)       [address]       [place], [place]  [address]       Ph: [phone]       Fax: [phone]   RxID:   [id number]   Prevention & Chronic Care Immunizations   Influenza vaccine: Not documented    Tetanus booster: Not documented    Pneumococcal vaccine: Not documented    H. zoster vaccine: Not documented  Colorectal Screening   Hemoccult: Not documented    Colonoscopy: Not documented  Other Screening   Pap smear: Not documented    Mammogram: No specific mammographic evidence of malignancy.  No significant changes compared to previous study.  Assessment: BIRADS 1.   (10/19/2007)   Mammogram due: 10/2008    DXA bone  density scan: Not documented   Smoking status: never  (09/19/2009)  Diabetes Mellitus   HgbA1C: 7.4  (08/31/2009)    Eye exam: No diabetic retinopathy.   No evidence of diabetic macular edema.  (09/02/2007)   Eye exam due: 09/2008    Foot exam: yes  (06/14/2008)   High risk foot: Not documented   Foot care education: Not documented    Urine microalbumin/creatinine ratio: 459.2  (08/19/2007)  Lipids   Total Cholesterol: 145  (08/31/2009)   LDL: 81  (08/31/2009)   LDL Direct: Not documented   HDL: 52  (08/31/2009)   Triglycerides: 62  (08/31/2009)    SGOT (AST): 12  (08/03/2008)   SGPT (ALT): <8 U/L  (08/03/2008) CMP ordered    Alkaline phosphatase: 73  (08/03/2008)   Total bilirubin: 0.5  (08/03/2008)  Hypertension   Last Blood Pressure: 172 / 83  (09/19/2009)   Serum creatinine: 1.73  (08/15/2008)   Serum potassium 4.1  (08/15/2008) CMP ordered   Self-Management Support :    Patient will work on the following items until the next clinic visit to reach self-care goals:     Medications and monitoring: take my medicines every day, check my blood sugar, bring all of my medications to every visit, examine my feet every day  (09/19/2009)     Eating: drink diet soda or water instead of juice or soda, eat more vegetables, eat foods that are low in salt, eat baked foods instead of fried foods, eat fruit for snacks and desserts, limit or avoid alcohol  (09/19/2009)     Activity: take a 30 minute walk every day  (09/19/2009)    Diabetes self-management support: Written self-care plan, Education handout, Pre-printed educational material  (09/19/2009)   Diabetes care plan printed   Diabetes education handout printed  Last diabetes self-management training by diabetes educator: 02/03/2007    Hypertension self-management support: Written self-care plan, Education handout, Pre-printed educational material  (09/19/2009)   Hypertension self-care plan printed.   Hypertension  education handout printed    Lipid self-management support: Written self-care plan, Education handout, Pre-printed educational material  (09/19/2009)   Lipid self-care plan printed.   Lipid education handout printed  Process Orders Check Orders Results:     [facility]: Check successful Tests Sent for requisitioning (Sep 19, 2009 4:47 PM):     09/19/2009: [facility] -- T-Comprehensive Metabolic Panel [id number] (signed)     Vital Signs:  Patient profile:   76 year old female Height:      68 inches (172.72 cm) Weight:      200.01 pounds (90.91 kg) BMI:     30.52 Temp:     98 degrees F (36.67 degrees C) oral Pulse rate:   68 / minute BP sitting:   172 / 83  (right arm)  Vitals Entered By: Angelina Ok RN (Sep 19, 2009 2:26 PM)

## 2010-06-18 NOTE — Progress Notes (Signed)
Summary: med refill/gp  Phone Note Refill Request Message from:  Fax from Pharmacy on December 17, 2009 12:17 PM  Need refill on One-touch Test Strips - test twice a day.   Method Requested: Telephone to Pharmacy Initial call taken by: Chinita Pester RN,  December 17, 2009 12:17 PM  Follow-up for Phone Call        Refill approved-nurse to complete    New/Updated Medications: * ONE-TOUCH TEST STRIPS. Use strips to check blood sugar two times a day. Prescriptions: ONE-TOUCH TEST STRIPS. Use strips to check blood sugar two times a day.  #180 x 8   Entered and Authorized by:   Vassie Loll MD   Signed by:   Vassie Loll MD on 12/17/2009   Method used:   Telephoned to ...       [facility] education [facility])       [address]       [place], [place]  [address]       Ph: [phone]       Fax: [phone]   RxID:   [id number]

## 2010-06-20 NOTE — Initial Assessments (Signed)
INTERNAL MEDICINE ADMISSION HISTORY AND PHYSICAL   Attending: Dr. Nadeen Landau  First Contact: Dr. Narda Bonds [phone] Second Contact: Dr. Eben Burow [phone]   PCP: Dr. Scot Dock CC: osteomyelitis  HPI: Pt is a 76 y/o woman with h/o DM2, HTN, and multiple episodes of diabetic foot ulcers complicated by osteomyelitis s/p amputation of the right 2nd and 3rd toe amputations in April '11 and Feb '10 respectively by Dr. Lajoyce Corners and Dr. Magnus Ivan as well as a left 3rd toe amputation, presenting on transfer from [facility] with osteomyelitis of the left distal second metatarsal and proximal phalangeal bones. She had been going to the wound care clinic consistently sinc her last amputation, and was seen previosuly on the 3rd of January where she had been evaluated for a stage IV wound requiring hyperbaric oxygen. Pt states that the wound of the left leg has been progressively getting worse, with increased pain, drainage of purulent fluid and foul odor. She went into the woundcare clinic today and was sent into the [facility] ED where a plain radiograph revealed osteomylelitis (there she was clinically stable and afebrile). She denied any fever, chills, diaphoresis, recent weight changes, cp, palpitations, sob, adb pain, n/v, dysuria, hematuria, or any other constitutional symptoms. She is able to ambulate with a walker.  ALLERGIES: NKDA  PAST MEDICAL HISTORY: Anemia- B12 deficiency on B12 IM shots Benign multinodular goiter- s/p total thyroidectomy 03/13/2006 per Dr Derrell Lolling Hypothyroidism - Surgically induced Asthma Diabetes mellitus, type II Hyperlipidemia Hypertension Pneumonia, hx of Atrial fibrillation Renal insufficiency Recurrent  Right pleural effusion -S/P VATS and pleurodesis 01/2005 IgA monoclonal gammopathy of uncertain significance - per Dr Arline Asp Deconditioning Seasonal allergies Cataracts Gastritis and superficial ulcers per EGD 12/2004 Spongiotic psoriasiform dermatitis 01/2006 Venous  insufficiency  MEDICATIONS: GLIPIZIDE 10 MG TABS (GLIPIZIDE) Take 1 tablet by mouth two times a day LISINOPRIL 40 MG TABS (LISINOPRIL) Take 1 tablet by mouth once a day JANUVIA 100 MG TABS (SITAGLIPTIN PHOSPHATE) Take 1 tablet by mouth once a day METOPROLOL TARTRATE 25 MG TABS (METOPROLOL TARTRATE) Take 1 tablet by mouth two times a day SYNTHROID 112 MCG  TABS (LEVOTHYROXINE SODIUM) Take 1 tablet by mouth once a day PRAVACHOL 20 MG TABS (PRAVASTATIN SODIUM) Take 1 tab by mouth at bedtime ASPIRIN 81 MG TABS (ASPIRIN) Take 1 tablet by mouth once a day LANCETS ULTRA THIN  MISC (LANCETS) Use the lancets to check your Blood sugar three times daily. AMLODIPINE BESYLATE 10 MG TABS (AMLODIPINE BESYLATE) Take 1 tablet by mouth once a day * ONE-TOUCH TEST STRIPS. Use strips to check blood sugar two times a day.   SOCIAL HISTORY: Lives in [place] by herself, No tobacco, no ETOH USE, no IVDU  FAMILY HISTORY: Mother deceased from rectal cancer. Father deceased from MI 1 sister deceased from coma s/p neck surgery. 2 brothers deceased, 1 from bone cancer and other from lung cancer. 1 living brother healthy. 3 children- 1 daughter ( DMII), 2 sons healthy.  ROS: Per HPI  VITALS: T: 99.2 P: 75 BP:174/73  R:20  O2SAT: 98% ON:RA  PHYSICAL EXAM: General:  alert, well-developed, but uncomfortable appearing. Head:  normocephalic and atraumatic.   Eyes:  vision grossly intact, pupils equal, pupils round, pupils reactive to light, no injection and anicteric.   Mouth:  pharynx pink and moist, no erythema, and no exudates. Neck:  supple, full ROM, no thyromegaly, no JVD, and no carotid bruits.   Lungs:  normal respiratory effort, no accessory muscle use, normal breath sounds, no crackles, and no wheezes.  Heart:  normal rate, regular rhythm, no murmur, no gallop, and no rub.   Abdomen:  soft, non-tender, normal bowel sounds, no distention, no guarding, no rebound tenderness, no hepatomegaly, and no  splenomegaly. Msk:  no joint swelling, no joint warmth, and no redness over joints.   Pulses:  2+ DP/PT pulses bilaterally Extremities:  Right foot: deep ulcer (stage 4) on plantar surface near the 1st MT joint, draining purulent fluid and malodorous, entire foot is erythematous and there's a dark pigmentation over the entire dorsum. The entire foot is warm to touch. Left foot: stage 3 ulcer located over the plantar surface of the foot with drainage of purulent material and also has a foul odor. Foot is also warm to touch. Neurologic:  alert & oriented X3, exam is nonfocal. Psych:  anxious appearing  LABS: WBC                                      10.8       h      4.0-10.5         K/uL  RBC                                      3.12       l      3.87-5.11        MIL/uL  Hemoglobin (HGB)                         8.8        l      12.0-15.0        g/dL  Hematocrit (HCT)                         27.6       l      36.0-46.0        %  MCV                                      88.5              78.0-100.0       fL  MCH -                                    28.2              26.0-34.0        pg  MCHC                                     31.9              30.0-36.0        g/dL  RDW                                      13.3              11.5-15.5        %  Platelet Count (PLT)                     238               150-400          K/uL  Neutrophils, %                           80         h      43-77            %  Lymphocytes, %                           9          l      12-46            %  Monocytes, %                             10                3-12             %  Eosinophils, %                           1                 0-5              %  Basophils, %                             0                 0-1              %  Neutrophils, Absolute                    8.6        h      1.7-7.7          K/uL  Lymphocytes, Absolute                    1.0               0.7-4.0          K/uL  Monocytes, Absolute                       1.0               0.1-1.0          K/uL  Eosinophils, Absolute                    0.1               0.0-0.7          K/uL  Basophils, Absolute                      0.0               0.0-0.1          K/uL  TCO2  24                0-100            mmol/L  Ionized Calcium                          1.03       l      1.12-1.32        mmol/L  Hemoglobin (HGB)                         9.5        l      12.0-15.0        g/dL  Hematocrit (HCT)                         28.0       l      36.0-46.0        %  Sodium (NA)                              134        l      135-145          mEq/L  Potassium (K)                            4.6               3.5-5.1          mEq/L  Chloride                                 103               96-112           mEq/L  Glucose                                  120        h      70-99            mg/dL  BUN                                      34         h      6-23             mg/dL  Creatinine                               2.1        h      0.4-1.2          mg/dL  LEFT FOOT - COMPLETE 3+ VIEW    Comparison: 05/15/2010    Findings: Since the prior exam the patient has developed   destruction and pathologic fractures of the distal second   metatarsal and base of the proximal phalangeal bone of the second   toe.  Findings are consistent with osteomyelitis.    There is  been previous amputation of the distal aspect of the third   metatarsal and the third toe.    No other significant abnormalities.  Extensive arterial   calcification is noted in the foot.    IMPRESSION:   Osteomyelitis with pathologic destruction of the distal second   metatarsal and proximal phalangeal bone of the second toe.   ASSESSMENT AND PLAN:  1) Osteomyelitis of left 2nd toe/ right foot diabetic ulcer - most likely from severe PVD, extensive h/o diabetic foot ulcers resulting in osteomyelitis and requiring amputation x 2.  - MRI - right and  left foot to determine extent of ulcer - Ortho consult for likely amputation - IV Vanc and Zosyn. Currently does not appear to be acutely ill, although does have some leukocytosis but is afebrile. - F/u blood cultures  2) DM2 - Last AIC 7.4 in April 2011.  - SSI sensitive, hold Glipizide - F/u AIC - F/u daily CBGs and adjust insulin as needed  3) CRF- baseline Cr 1.3 to 1.7 - mild ARF, Cr at 2.1. Will hydrate gently with NS @ 75cc/hr  4) Chronic renal failure - Will give pt gentle hydration with NS at 75 cc/hr and continue to monitor serial Cr.   5) HTN - Mildly elevated, will resume home meds with the exception of Lisinopril given #3.   6) Anemia - Hgb baseline between 9-10. Anemia panel was checked in 2008 revealing a pattern more consistent with ACD. Will follow daily H & H.  7) Hypothyroidism - Will check TSH and resume home dose of synthroid.  8) HLD - Will check FLP and resume statin.  9) VTE PROPH: lovenox   Attending Physician:

## 2010-06-20 NOTE — Op Note (Signed)
  [name], [name]                 ACCOUNT NO.:  [id number]  MEDICAL RECORD NO.:  [id number]          PATIENT TYPE:  INP  LOCATION:  5158                         FACILITY:  [facility]  PHYSICIAN:  Nadara Mustard, MD     DATE OF BIRTH:  1935-05-01  DATE OF PROCEDURE:  05/30/2010 DATE OF DISCHARGE:                              OPERATIVE REPORT   PREOPERATIVE DIAGNOSIS:  Abscess and osteomyelitis, left forefoot.  POSTOPERATIVE DIAGNOSIS:  Abscess and osteomyelitis, left forefoot.  PROCEDURE:  Left midfoot amputation.  SURGEON:  Nadara Mustard, MD  ANESTHESIA:  General.  ESTIMATED BLOOD LOSS:  Minimal.  Antibiotics were obtained preoperatively.  DRAINS:  None.  COMPLICATIONS:  None.  TOURNIQUET TIME:  None.  DISPOSITION:  PACU in stable condition.  INDICATION FOR THE PROCEDURE:  The patient is a 76 year old woman with diabetes type 2.  She [facility]  The patient states that despite the appropriate wound care, she has had progressive ulceration, cellulitis, and infection in the left foot.  Also has an ulceration of the right foot.  Due to failure of conservative care, the patient presented at this time for left midfoot amputation.  Risks and benefits were discussed including infection, neurovascular injury, nonhealing of the wound, need for higher-level amputation.  The patient states she understands and wished to proceed at this time.  DESCRIPTION OF THE PROCEDURE:  The patient was brought to the OR room 4 and underwent a general anesthetic.  After adequate level of anesthesia was obtained, the patient's left lower extremity was prepped using DuraPrep and draped into the sterile field.  A fishmouth-type incision was made through the healthy viable tissue just proximal to the ulceration.  The transmetatarsal amputation was performed at the base of the metatarsals.  The electrocautery was used for hemostasis.   The wound was irrigated with normal saline.  There was no evidence of any abscess and necrotic tissue within the surgical incision from the patients abscess and osteomyelitis of the forefoot.  After hemostasis was obtained and the wound was irrigated, the skin was closed using 2-0 nylon with a far-near- near-far suture technique.  The skin was closed without any tension on the skin.  The wound was covered with Adaptic orthopedic sponges, ABD dressing, Kerlix and Coban.  The patient was extubated and taken to the PACU in stable condition.  Physical therapy, nonweightbearing on the left.     Nadara Mustard, MD     MVD/MEDQ  D:  05/30/2010  T:  05/31/2010  Job:  [id number]  Electronically Signed by Aldean Baker MD on 06/20/2010 07:33:57 AM

## 2010-06-20 NOTE — Discharge Summary (Signed)
Summary: Hospital Discharge Update    Hospital Discharge Update:  Date of Admission: 05/29/2010 Date of Discharge: 06/03/2010  Brief Summary:  Pt with pmh outlined below here for the following: 1. left forefoot amputation s/p amputation on Jan 12 by Dr. Lajoyce Corners. Discharged to SNF rehab. Complete 7 day course of Doxycycline. 2. Anemia, iron deficient s/p IV iron, one time dose; h/o IgA monoclonal gammopathy f/u by Dr. Arline Asp. Held off on starting oral iron during admission 2/2 constipation. 3. Type 2 DM - blood sugars well controlled during hospitalization on SSI. Restarted on orals on discharge with no changes made.  4. Acute on chronic renal insufficiency - 2/2 volume depletion. Bun/Cr back to baseline after IVFs. 5. Hypothyroidsm - increased Synthroid to  Lab or other results pending at discharge:  None  Other follow-up issues:  How she's doing s/p amputation. Referral for colonoscopy, unclear when her last one was. Start oral iron supplements. Not started during admission due to constipation.  Problem list changes:  Assessed OSTEOMYELITIS as comment only  Medication list changes:  Changed medication from SYNTHROID 112 MCG  TABS (LEVOTHYROXINE SODIUM) Take 1 tablet by mouth once a day to LEVOTHROID 125 MCG TABS (LEVOTHYROXINE SODIUM) 1 tablet by mouth daily before breakfast - Signed Added new medication of DOXYCYCLINE HYCLATE 100 MG CAPS (DOXYCYCLINE HYCLATE) 1 tablet by mouth every 12 hours for the next 3 days - Signed Added new medication of ASPIR-LOW 81 MG TBEC (ASPIRIN) 1 tablet by mouth daily - Signed Added new medication of COLACE 100 MG CAPS (DOCUSATE SODIUM) 1 tablet by mouth two times a day for constipation. hold for loose stools. - Signed Added new medication of PERCOCET 5-325 MG TABS (OXYCODONE-ACETAMINOPHEN) 1 tablet by mouth every 4 hours as needed for pain - Signed Added new medication of ROXICODONE 5 MG TABS (OXYCODONE HCL) 5 to 10mg  by mouth every 4 hours as  needed for pain - Signed Rx of LEVOTHROID 125 MCG TABS (LEVOTHYROXINE SODIUM) 1 tablet by mouth daily before breakfast;  #30 x 3;  Signed;  Entered by: Jaci Lazier MD;  Authorized by: Jaci Lazier MD;  Method used: Electronically to [facility] [id number] [place], [place], [place]  [address], Ph: [phone], Fax: [phone] Rx of DOXYCYCLINE HYCLATE 100 MG CAPS (DOXYCYCLINE HYCLATE) 1 tablet by mouth every 12 hours for the next 3 days;  #6 x 0;  Signed;  Entered by: Jaci Lazier MD;  Authorized by: Jaci Lazier MD;  Method used: Electronically to [facility] [id number] [place], [place], [place]  [address], Ph: [phone], Fax: [phone] Rx of ASPIR-LOW 81 MG TBEC (ASPIRIN) 1 tablet by mouth daily;  #60 x 11;  Signed;  Entered by: Jaci Lazier MD;  Authorized by: Jaci Lazier MD;  Method used: Print then Give to Patient Rx of COLACE 100 MG CAPS (DOCUSATE SODIUM) 1 tablet by mouth two times a day for constipation. hold for loose stools.;  #30 x 3;  Signed;  Entered by: Jaci Lazier MD;  Authorized by: Jaci Lazier MD;  Method used: Print then Give to Patient Rx of PERCOCET 5-325 MG TABS (OXYCODONE-ACETAMINOPHEN) 1 tablet by mouth every 4 hours as needed for pain;  #30 x 0;  Signed;  Entered by: Jaci Lazier MD;  Authorized by: Jaci Lazier MD;  Method used: Print then Give to Patient Rx of ROXICODONE 5 MG TABS (OXYCODONE HCL) 5 to 10mg  by mouth every 4 hours as  needed for pain;  #30 x 0;  Signed;  Entered by: Jaci Lazier MD;  Authorized by: Jaci Lazier MD;  Method used: Print then Give to Patient Rx of LEVOTHROID 125 MCG TABS (LEVOTHYROXINE SODIUM) 1 tablet by mouth daily before breakfast;  #30 x 3;  Signed;  Entered by: Jaci Lazier MD;  Authorized by: Jaci Lazier MD;  Method used: Print then Give to Patient Rx of DOXYCYCLINE HYCLATE 100 MG CAPS (DOXYCYCLINE HYCLATE) 1 tablet by mouth every 12 hours for the next 3 days;  #6 x 0;  Signed;  Entered  by: Jaci Lazier MD;  Authorized by: Jaci Lazier MD;  Method used: Print then Give to Patient  The medication, problem, and allergy lists have been updated.  Please see the dictated discharge summary for details.  Discharge medications:  GLIPIZIDE 10 MG TABS (GLIPIZIDE) Take 1 tablet by mouth two times a day LISINOPRIL 40 MG TABS (LISINOPRIL) Take 1 tablet by mouth once a day JANUVIA 100 MG TABS (SITAGLIPTIN PHOSPHATE) Take 1 tablet by mouth once a day METOPROLOL TARTRATE 25 MG TABS (METOPROLOL TARTRATE) Take 1 tablet by mouth two times a day LEVOTHROID 125 MCG TABS (LEVOTHYROXINE SODIUM) 1 tablet by mouth daily before breakfast PRAVACHOL 20 MG TABS (PRAVASTATIN SODIUM) Take 1 tab by mouth at bedtime ASPIRIN 81 MG TABS (ASPIRIN) Take 1 tablet by mouth once a day LANCETS ULTRA THIN  MISC (LANCETS) Use the lancets to check your Blood sugar three times daily. AMLODIPINE BESYLATE 10 MG TABS (AMLODIPINE BESYLATE) Take 1 tablet by mouth once a day * ONE-TOUCH TEST STRIPS. Use strips to check blood sugar two times a day. ASPIR-LOW 81 MG TBEC (ASPIRIN) 1 tablet by mouth daily COLACE 100 MG CAPS (DOCUSATE SODIUM) 1 tablet by mouth two times a day for constipation. hold for loose stools. PERCOCET 5-325 MG TABS (OXYCODONE-ACETAMINOPHEN) 1 tablet by mouth every 4 hours as needed for pain ROXICODONE 5 MG TABS (OXYCODONE HCL) 5 to 10mg  by mouth every 4 hours as needed for pain  Other patient instructions:  Pls see attached pink sheet. Pls call the clinic if you have any questions or concerns.  Note: Hospital Discharge Medications & Other Instructions handout was printed, one copy for patient and a second copy to be placed in hospital chart.  Prescriptions: DOXYCYCLINE HYCLATE 100 MG CAPS (DOXYCYCLINE HYCLATE) 1 tablet by mouth every 12 hours for the next 3 days  #6 x 0   Entered and Authorized by:   Jaci Lazier MD   Signed by:   Jaci Lazier MD on 06/03/2010   Method used:   Print then Give to  Patient   RxID:   [id number] LEVOTHROID 125 MCG TABS (LEVOTHYROXINE SODIUM) 1 tablet by mouth daily before breakfast  #30 x 3   Entered and Authorized by:   Jaci Lazier MD   Signed by:   Jaci Lazier MD on 06/03/2010   Method used:   Print then Give to Patient   RxID:   [id number] ROXICODONE 5 MG TABS (OXYCODONE HCL) 5 to 10mg  by mouth every 4 hours as needed for pain  #30 x 0   Entered and Authorized by:   Jaci Lazier MD   Signed by:   Jaci Lazier MD on 06/03/2010   Method used:   Print then Give to Patient   RxID:   [id number] PERCOCET 5-325 MG TABS (OXYCODONE-ACETAMINOPHEN) 1 tablet by mouth every 4 hours as needed for pain  #30 x 0   Entered and Authorized  by:   Jaci Lazier MD   Signed by:   Jaci Lazier MD on 06/03/2010   Method used:   Print then Give to Patient   RxID:   [id number] COLACE 100 MG CAPS (DOCUSATE SODIUM) 1 tablet by mouth two times a day for constipation. hold for loose stools.  #30 x 3   Entered and Authorized by:   Jaci Lazier MD   Signed by:   Jaci Lazier MD on 06/03/2010   Method used:   Print then Give to Patient   RxID:   [id number] ASPIR-LOW 81 MG TBEC (ASPIRIN) 1 tablet by mouth daily  #60 x 11   Entered and Authorized by:   Jaci Lazier MD   Signed by:   Jaci Lazier MD on 06/03/2010   Method used:   Print then Give to Patient   RxID:   [id number] DOXYCYCLINE HYCLATE 100 MG CAPS (DOXYCYCLINE HYCLATE) 1 tablet by mouth every 12 hours for the next 3 days  #6 x 0   Entered and Authorized by:   Jaci Lazier MD   Signed by:   Jaci Lazier MD on 06/03/2010   Method used:   Electronically to        [facility] [id number]* (retail)       [address] [place], [place]  [address]       Ph: [phone]       Fax: [phone]   RxID:   [id number] LEVOTHROID 125 MCG TABS (LEVOTHYROXINE SODIUM) 1 tablet by mouth daily before breakfast  #30 x 3   Entered and Authorized by:   Jaci Lazier MD   Signed by:   Jaci Lazier MD on 06/03/2010   Method used:   Electronically to        [facility] [id number]* (retail)       [address]       [place], [place]  [address]       Ph: [phone]       Fax: [phone]   RxID:   [id number]

## 2010-06-20 NOTE — Assessment & Plan Note (Signed)
Summary: EST-CK/FU/MEDS/CFB   Vital Signs:  Patient profile:   76 year old female Height:      68 inches (172.72 cm) Weight:      208.05 pounds BMI:     31.75 Temp:     98 degrees F (36.67 degrees C) oral Pulse rate:   68 / minute BP sitting:   164 / 68  (left arm) Cuff size:   large  Vitals Entered By: Angelina Ok RN (June 11, 2010 9:10 AM) CC: Depression Is Patient Diabetic? Yes Did you bring your meter with you today? No Pain Assessment Patient in pain? no      Nutritional Status BMI of > 30 = obese CBG Result 119  Have you ever been in a relationship where you felt threatened, hurt or afraid?No   Does patient need assistance? Functional Status Self care Ambulation Impaired:Risk for fall, Wheelchair Comments Recent amputattion.  [facility]  Daily dressing changes.  Apply creme and dressing.   Primary Care Provider:  Bethel Born MD  CC:  Depression.  History of Present Illness: 76 y/o woman with h/o DM2, HTN, and multiple episodes of diabetic foot ulcers complicated by osteomyelitis s/p left forefoot amputation 05/30/10, pernicious anemia and MGUS is here for hospital follow up  Post operative recovery - no fever, worsening pain, redness on the sight of ampuitation - getting PT at the nursing home - followed with Dr. Lajoyce Corners once after discharge  SOB -c/o exertional shortness of breath since 2-3 years - not worse recently though - no wheezing or SOB with rest or while sleeping  Anemia ( iron def + ACD)  - is currently not on iron pills becaue she was constipated in the hospital - wants to restart it now - follows at cancer center for her pernicious anemia and MGUS  DM - she is on [place] and glipizde for DM - sugars well controleld at home  HTN - been checked at the nursing home and she was told it stays between 120-130  Depression History:      The patient is having a depressed mood most of the day but  denies diminished interest in her usual daily activities.        The patient denies that she feels like life is not worth living, denies that she wishes that she were dead, and denies that she has thought about ending her life.        Comments:  Doing better.  Does OT and PT to help.   Preventive Screening-Counseling & Management  Alcohol-Tobacco     Alcohol drinks/day: 0     Smoking Status: never  Current Medications (verified): 1)  Glipizide 10 Mg Tabs (Glipizide) .... Take 1 Tablet By Mouth Two Times A Day 2)  Lisinopril 40 Mg Tabs (Lisinopril) .... Take 1 Tablet By Mouth Once A Day 3)  Januvia 100 Mg Tabs (Sitagliptin Phosphate) .... Take 1 Tablet By Mouth Once A Day 4)  Metoprolol Tartrate 25 Mg Tabs (Metoprolol Tartrate) .... Take 1 Tablet By Mouth Two Times A Day 5)  Levothroid 125 Mcg Tabs (Levothyroxine Sodium) .Marland Kitchen.. 1 Tablet By Mouth Daily Before Breakfast 6)  Pravachol 20 Mg Tabs (Pravastatin Sodium) .... Take 1 Tab By Mouth At Bedtime 7)  Aspirin 81 Mg Tabs (Aspirin) .... Take 1 Tablet By Mouth Once A Day 8)  Lancets Ultra Thin  Misc (Lancets) .... Use The Lancets To Check Your Blood Sugar Three Times Daily. 9)  Amlodipine  Besylate 10 Mg Tabs (Amlodipine Besylate) .... Take 1 Tablet By Mouth Once A Day 10)  One-Touch Test Strips. .... Use Strips To Check Blood Sugar Two Times A Day. 11)  Aspir-Low 81 Mg Tbec (Aspirin) .Marland Kitchen.. 1 Tablet By Mouth Daily 12)  Colace 100 Mg Caps (Docusate Sodium) .Marland Kitchen.. 1 Tablet By Mouth Two Times A Day For Constipation. Hold For Loose Stools. 13)  Percocet 5-325 Mg Tabs (Oxycodone-Acetaminophen) .Marland Kitchen.. 1 Tablet By Mouth Every 4 Hours As Needed For Pain 14)  Roxicodone 5 Mg Tabs (Oxycodone Hcl) .... 5 To 10mg  By Mouth Every 4 Hours As Needed For Pain  Allergies (verified): No Known Drug Allergies  Review of Systems       The patient complains of dyspnea on exertion and peripheral edema.  The patient denies anorexia, fever, weight loss, weight gain,  vision loss, decreased hearing, hoarseness, chest pain, syncope, prolonged cough, headaches, hemoptysis, abdominal pain, melena, hematochezia, severe indigestion/heartburn, hematuria, incontinence, genital sores, muscle weakness, suspicious skin lesions, transient blindness, difficulty walking, depression, unusual weight change, abnormal bleeding, enlarged lymph nodes, angioedema, breast masses, and testicular masses.    Physical Exam  General:  Gen: VS reveiwed, Alert, well developed, nodistress ENT: mucous membranes pink & moist. No abnormal finds in ear and nose. CVC:S1 S2 , no murmurs, no abnormal heart sounds. Lungs: Clear to auscultation B/L. No wheezes, crackles or other abnormal sounds Abdomen: soft, non distended, no tender. Normal Bowel sounds EXT: no pitting edema or engorged veins on right, clean dressing on the left foot Pulsations normal (rt) Neuro:alert, oriented *3, cranial nerved 2-12 intact, strenght normal in all  extremities, senstations normal to light touch.      Impression & Recommendations:  Problem # 1:  OSTEOMYELITIS, ACUTE, ANKLE/FOOT (ICD-730.07) doing well since amputation getting PT and dressing changes at the nursing home Is being followed up by Dr. Lajoyce Corners Pain controlled with current pain regimen  Problem # 2:  DIABETES-TYPE 2 (ICD-250.00) CBGs well controlled at the nursing home as per her report, could not download her meter readings today due to tecnhical reason continue with current managment  Her updated medication list for this problem includes:    Glipizide 10 Mg Tabs (Glipizide) .Marland Kitchen... Take 1 tablet by mouth two times a day    Lisinopril 40 Mg Tabs (Lisinopril) .Marland Kitchen... Take 1 tablet by mouth once a day    Januvia 100 Mg Tabs (Sitagliptin phosphate) .Marland Kitchen... Take 1 tablet by mouth once a day    Aspirin 81 Mg Tabs (Aspirin) .Marland Kitchen... Take 1 tablet by mouth once a day    Aspir-low 81 Mg Tbec (Aspirin) .Marland Kitchen... 1 tablet by mouth daily  Orders: T-Basic Metabolic  Panel ([id number])  Labs Reviewed: Creat: 1.20 (09/19/2009)     Last Eye Exam: No diabetic retinopathy.   No evidence of diabetic macular edema. (09/02/2007) Reviewed HgBA1c results: 7.1 (05/29/2010)  7.4 (08/31/2009)  Problem # 3:  HYPOTHYROIDISM (ICD-244.9) synthoid dose increase in the hospital tolerating well check TSH in 1-2 month  Her updated medication list for this problem includes:    Levothroid 125 Mcg Tabs (Levothyroxine sodium) .Marland Kitchen... 1 tablet by mouth daily before breakfast  Labs Reviewed: TSH: 0.776 (06/14/2008)    HgBA1c: 7.1 (05/29/2010) Chol: 166 (05/31/2010)   HDL: 52 (05/31/2010)   LDL: 17 (05/31/2010)   TG: 62 (08/31/2009)  Problem # 4:  RENAL INSUFFICIENCY (ICD-588.9) resolved prior to discharge.  will check BMET today  Problem # 5:  ANEMIA, VITAMIN B12 DEFICIENCY (ICD-281.1) she has  some fatigue and DOE her lung exam is normal and her EF in 55%, CXR in hospital showed chirnic bronchitis changes otherwise normal may be her anemia is contributing to her symptoms she recieved iv iron in the hospital, will restart her iron tablets although I am not sure if she needs iron supplements given her ferritin was 300 in the hospital. Will recheck ferritin again at next visit along with cbc and devide on iron therapy will check CBC she is followed up at cancer center as well for her MGUS  Her updated medication list for this problem includes:    Ferrous Sulfate 325 (65 Fe) Mg Tabs (Ferrous sulfate) .Marland Kitchen... Week-1 1 tablet by mouth a day for 1 week-2 1 tablet by mouth twice a day week 3 onwards: 1 tablet by mouth three times a day  Orders: Gastroenterology Referral (GI) T-CBC w/Diff ([id number])  Hgb: 10.7 (08/03/2008)   Hct: 33.5 (08/03/2008)   Platelets: 165 (08/03/2008) RBC: 3.72 (08/03/2008)   RDW: 14.3 (08/03/2008)   WBC: 6.3 (08/03/2008) MCV: 90.1 (08/03/2008)   MCHC: 31.9 (08/03/2008) B12: 722 (08/03/2008)   TSH: 0.776 (06/14/2008)  Problem # 6:   HYPERTENSION (ICD-401.9) IS high today, but was wel controlled at the nursing home no change today, will continue to follow  Her updated medication list for this problem includes:    Lisinopril 40 Mg Tabs (Lisinopril) .Marland Kitchen... Take 1 tablet by mouth once a day    Metoprolol Tartrate 25 Mg Tabs (Metoprolol tartrate) .Marland Kitchen... Take 1 tablet by mouth two times a day    Amlodipine Besylate 10 Mg Tabs (Amlodipine besylate) .Marland Kitchen... Take 1 tablet by mouth once a day  BP today: 164/68 Prior BP: 172/83 (09/19/2009)  Labs Reviewed: K+: 4.3 (09/19/2009) Creat: : 1.20 (09/19/2009)   Chol: 166 (05/31/2010)   HDL: 52 (05/31/2010)   LDL: 17 (05/31/2010)   TG: 62 (08/31/2009)  Problem # 7:  HYPERLIPIDEMIA (ICD-272.4) at goal  Her updated medication list for this problem includes:    Pravachol 20 Mg Tabs (Pravastatin sodium) .Marland Kitchen... Take 1 tab by mouth at bedtime  Labs Reviewed: SGOT: 12 (09/19/2009)   SGPT: <8 U/L (09/19/2009)   HDL:52 (05/31/2010), 52 (08/31/2009)  LDL:17 (05/31/2010), 81 (08/31/2009)  Chol:166 (05/31/2010), 145 (08/31/2009)  Trig:62 (08/31/2009), 80 (08/03/2008)  Complete Medication List: 1)  Glipizide 10 Mg Tabs (Glipizide) .... Take 1 tablet by mouth two times a day 2)  Lisinopril 40 Mg Tabs (Lisinopril) .... Take 1 tablet by mouth once a day 3)  Januvia 100 Mg Tabs (Sitagliptin phosphate) .... Take 1 tablet by mouth once a day 4)  Metoprolol Tartrate 25 Mg Tabs (Metoprolol tartrate) .... Take 1 tablet by mouth two times a day 5)  Levothroid 125 Mcg Tabs (Levothyroxine sodium) .Marland Kitchen.. 1 tablet by mouth daily before breakfast 6)  Pravachol 20 Mg Tabs (Pravastatin sodium) .... Take 1 tab by mouth at bedtime 7)  Aspirin 81 Mg Tabs (Aspirin) .... Take 1 tablet by mouth once a day 8)  Lancets Ultra Thin Misc (Lancets) .... Use the lancets to check your blood sugar three times daily. 9)  Amlodipine Besylate 10 Mg Tabs (Amlodipine besylate) .... Take 1 tablet by mouth once a day 10)  One-touch  Test Strips.  .... Use strips to check blood sugar two times a day. 11)  Aspir-low 81 Mg Tbec (Aspirin) .Marland Kitchen.. 1 tablet by mouth daily 12)  Colace 100 Mg Caps (Docusate sodium) .Marland Kitchen.. 1 tablet by mouth two times a day for constipation. hold for  loose stools. 13)  Percocet 5-325 Mg Tabs (Oxycodone-acetaminophen) .Marland Kitchen.. 1 tablet by mouth every 4 hours as needed for pain 14)  Roxicodone 5 Mg Tabs (Oxycodone hcl) .... 5 to 10mg  by mouth every 4 hours as needed for pain 15)  Ferrous Sulfate 325 (65 Fe) Mg Tabs (Ferrous sulfate) .... Week-1 1 tablet by mouth a day for 1 week-2 1 tablet by mouth twice a day week 3 onwards: 1 tablet by mouth three times a day  Other Orders: Capillary Blood Glucose/CBG (14782) Capillary Blood Glucose/CBG (95621)  Patient Instructions: 1)  Please schedule a follow-up appointment in 1 month. Prescriptions: FERROUS SULFATE 325 (65 FE) MG TABS (FERROUS SULFATE) Week-1 1 tablet by mouth a day for 1 week-2 1 tablet by mouth twice a day Week 3 onwards: 1 tablet by mouth three times a day  #90 x 1   Entered and Authorized by:   Bethel Born MD   Signed by:   Bethel Born MD on 06/11/2010   Method used:   Electronically to        [facility] [id number] (retail)       [address]       [place], [place]  [address]       Ph: [phone]       Fax: [phone]   RxID:   [id number]    Orders Added: 1)  Capillary Blood Glucose/CBG [95638] 2)  Capillary Blood Glucose/CBG [82948] 3)  T-Basic Metabolic Panel [[id number]] 4)  Gastroenterology Referral [GI] 5)  Est. Patient Level V [99215] 6)  T-CBC w/Diff [[id number]]     Vital Signs:  Patient profile:   76 year old female Height:      68 inches (172.72 cm) Weight:      208.05 pounds BMI:     31.75 Temp:     98 degrees F (36.67 degrees C) oral Pulse rate:   68 / minute BP sitting:   164 / 68  (left arm) Cuff size:   large  Vitals Entered By: Angelina Ok RN (June 11, 2010 9:10  AM)   Prevention & Chronic Care Immunizations   Influenza vaccine: Not documented    Tetanus booster: Not documented    Pneumococcal vaccine: Not documented    H. zoster vaccine: Not documented  Colorectal Screening   Hemoccult: Not documented    Colonoscopy: Not documented  Other Screening   Pap smear: Not documented    Mammogram: No specific mammographic evidence of malignancy.  No significant changes compared to previous study.  Assessment: BIRADS 1.   (10/19/2007)   Mammogram due: 10/2008    DXA bone density scan: Not documented   Smoking status: never  (06/11/2010)  Diabetes Mellitus   HgbA1C: 7.1  (05/29/2010)    Eye exam: No diabetic retinopathy.   No evidence of diabetic macular edema.  (09/02/2007)   Eye exam due: 09/2008    Foot exam: yes  (06/14/2008)   High risk foot: Not documented   Foot care education: Not documented    Urine microalbumin/creatinine ratio: 459.2  (08/19/2007)  Lipids   Total Cholesterol: 166  (05/31/2010)   LDL: 17  (05/31/2010)   LDL Direct: Not documented   HDL: 52  (05/31/2010)   Triglycerides: 62  (08/31/2009)    SGOT (AST): 12  (09/19/2009)   SGPT (ALT): <8 U/L  (09/19/2009)   Alkaline phosphatase: 74  (09/19/2009)   Total bilirubin: 0.7  (09/19/2009)  Hypertension   Last Blood Pressure: 164 / 68  (06/11/2010)   Serum creatinine: 1.20  (09/19/2009)   Serum potassium 4.3  (09/19/2009)  Self-Management Support :    Patient will work on the following items until the next clinic visit to reach self-care goals:     Medications and monitoring: take my medicines every day, check my blood sugar, bring all of my medications to every visit, examine my feet every day  (06/11/2010)     Eating: drink diet soda or water instead of juice or soda, eat more vegetables, use fresh or frozen vegetables, eat foods that are low in salt, eat baked foods instead of fried foods, eat fruit for snacks and desserts, limit or avoid alcohol   (06/11/2010)     Activity: take a 30 minute walk every day  (06/11/2010)    Diabetes self-management support: Written self-care plan, Education handout, Pre-printed educational material, Resources for patients handout  (06/11/2010)   Diabetes care plan printed   Diabetes education handout printed   Last diabetes self-management training by diabetes educator: 02/03/2007    Hypertension self-management support: Written self-care plan, Education handout, Pre-printed educational material, Resources for patients handout  (06/11/2010)   Hypertension self-care plan printed.   Hypertension education handout printed    Lipid self-management support: Written self-care plan, Education handout, Pre-printed educational material, Resources for patients handout  (06/11/2010)   Lipid self-care plan printed.   Lipid education handout printed      Resource handout printed.     Process Orders Check Orders Results:     [facility]: Check successful Tests Sent for requisitioning (June 12, 2010 8:39 AM):     06/11/2010: [facility] -- T-Basic Metabolic Panel [phone] (signed)     06/11/2010: [facility] -- [facility] w/Diff [[id number]] (signed)

## 2010-06-20 NOTE — Progress Notes (Signed)
Summary: medrefill/gp  Phone Note Refill Request Message from:  Fax from Pharmacy on May 07, 2010 4:27 PM  Refills Requested: Medication #1:  JANUVIA 100 MG TABS Take 1 tablet by mouth once a day   Last Refilled: 03/30/2010 Last appt. May 4.    Method Requested: Electronic Initial call taken by: Chinita Pester RN,  May 07, 2010 4:28 PM  Follow-up for Phone Call        Refilled electronically.  Patient needs a follow-up appointment. Follow-up by: Margarito Liner MD,  May 08, 2010 11:30 AM  Additional Follow-up for Phone Call Additional follow up Details #1::        Flag sent to [facility] for an appt. Additional Follow-up by: Chinita Pester RN,  May 09, 2010 9:12 AM    Prescriptions: JANUVIA 100 MG TABS (SITAGLIPTIN PHOSPHATE) Take 1 tablet by mouth once a day  #30 x 1   Entered and Authorized by:   Margarito Liner MD   Signed by:   Margarito Liner MD on 05/08/2010   Method used:   Electronically to        [facility] [id number] (retail)       [address] [place], [place]  [address]       Ph: [phone]       Fax: [phone]   RxID:   [id number]

## 2010-06-21 NOTE — Consult Note (Signed)
[facility]   Imported By: Margie Billet 11/21/2009 10:56:00  _____________________________________________________________________  External Attachment:    Type:   Image     Comment:   External Document

## 2010-06-25 ENCOUNTER — Telehealth: Payer: Self-pay | Admitting: Licensed Clinical Social Worker

## 2010-06-25 ENCOUNTER — Other Ambulatory Visit: Payer: Self-pay | Admitting: Licensed Clinical Social Worker

## 2010-06-25 DIAGNOSIS — M869 Osteomyelitis, unspecified: Secondary | ICD-10-CM

## 2010-06-25 NOTE — Telephone Encounter (Signed)
Discussed and agree with referral for in home aide

## 2010-06-25 NOTE — Telephone Encounter (Signed)
The patient's daughter called yesterday requesting an in-home aide for her mother who has just finished rehab at [facility] and is still in a wheelchair s/p amputation of her left toes due to osteomyelitis.   Request for assessment for in-home aide was completed bu Soc. Work and attending, Dr. Rogelia Boga signed off.  Faxed this PM so CCME nurse can go out and assess.

## 2010-06-25 NOTE — Procedures (Unsigned)
DUPLEX DEEP VENOUS EXAM - LOWER EXTREMITY  INDICATION:  Left lower extremity swelling.  HISTORY:  Edema:  Yes. Trauma/Surgery:  No. Pain:  Yes. PE:  No. Previous DVT:  No. Anticoagulants:  No. Other:  Ulcer on foot, Unna boot.  DUPLEX EXAM:               CFV   SFV   PopV  PTV         GSV               R  L  R  L  R  L  R   L       R  L Thrombosis    o  o     o     o      Not evaluated     o Spontaneous   +  +     +     +      NV         + Phasic        +  +     +     +      NV         + Augmentation  +  +     +     +      NV         + Compressible  +  +     +     +      NV         + Competent  Legend:  + - yes  o - no  p - partial  D - decreased  IMPRESSION: 1. No evidence of deep venous thrombosis in the left common femoral     vein, superficial femoral vein, popliteal vein or above knee     portion of the greater saphenous vein. 2. The left calf was not evaluated due to [employer]. 3. The right common femoral vein is within normal limits.   _____________________________ Larina Earthly, M.D.  LT/MEDQ  D:  04/19/2010  T:  04/19/2010  Job:  [id number]

## 2010-06-26 ENCOUNTER — Other Ambulatory Visit: Payer: Self-pay | Admitting: *Deleted

## 2010-06-26 NOTE — Telephone Encounter (Signed)
RTC to pt to ask about the medications that she needed to have refilled.  Pt was unsure of meds and will have her daughter call the Clinics with the list.

## 2010-07-03 ENCOUNTER — Telehealth: Payer: Self-pay | Admitting: Dietician

## 2010-07-03 NOTE — Telephone Encounter (Signed)
Called patient to find out who is helping her care for her foot wounds and when the last tie she saw them was. [name] saw dr. Lajoyce Corners on Friday, February 3rd,2012. [name] agreed to let us know when she is discharged from Dr. Audrie Lia care

## 2010-07-11 ENCOUNTER — Other Ambulatory Visit (HOSPITAL_COMMUNITY): Payer: Self-pay | Admitting: Oncology

## 2010-07-11 ENCOUNTER — Encounter (HOSPITAL_BASED_OUTPATIENT_CLINIC_OR_DEPARTMENT_OTHER): Payer: PRIVATE HEALTH INSURANCE | Admitting: Oncology

## 2010-07-11 DIAGNOSIS — D510 Vitamin B12 deficiency anemia due to intrinsic factor deficiency: Secondary | ICD-10-CM

## 2010-07-11 DIAGNOSIS — Z1231 Encounter for screening mammogram for malignant neoplasm of breast: Secondary | ICD-10-CM

## 2010-07-11 DIAGNOSIS — D649 Anemia, unspecified: Secondary | ICD-10-CM

## 2010-07-11 DIAGNOSIS — D472 Monoclonal gammopathy: Secondary | ICD-10-CM

## 2010-07-11 DIAGNOSIS — N289 Disorder of kidney and ureter, unspecified: Secondary | ICD-10-CM

## 2010-07-11 LAB — COMPREHENSIVE METABOLIC PANEL
ALT: <8 U/L (ref 0–35)
AST: 9 U/L (ref 0–37)
Albumin: 3.6 g/dL (ref 3.5–5.2)
Alkaline Phosphatase: 64 U/L (ref 39–117)
Potassium: 4.4 mEq/L (ref 3.5–5.3)
Sodium: 140 mEq/L (ref 135–145)
Total Protein: 7.2 g/dL (ref 6.0–8.3)

## 2010-07-11 LAB — VITAMIN B12: Vitamin B-12: 278 pg/mL (ref 211–911)

## 2010-07-11 LAB — CBC WITH DIFFERENTIAL/PLATELET
BASO%: 0.4 % (ref 0.0–2.0)
EOS%: 3.3 % (ref 0.0–7.0)
Eosinophils Absolute: 0.2 10*3/uL (ref 0.0–0.5)
MCH: 28.5 pg (ref 25.1–34.0)
MCHC: 32.9 g/dL (ref 31.5–36.0)
MCV: 86.7 fL (ref 79.5–101.0)
MONO%: 11.1 % (ref 0.0–14.0)
NEUT#: 4.3 10*3/uL (ref 1.5–6.5)
RBC: 3.01 10*6/uL — ABNORMAL LOW (ref 3.70–5.45)
RDW: 17.5 % — ABNORMAL HIGH (ref 11.2–14.5)

## 2010-07-15 ENCOUNTER — Other Ambulatory Visit: Payer: Self-pay | Admitting: Internal Medicine

## 2010-07-16 ENCOUNTER — Encounter: Payer: Self-pay | Admitting: Internal Medicine

## 2010-07-17 LAB — IRON AND TIBC
%SAT: 24 % (ref 20–55)
Iron: 56 ug/dL (ref 42–145)
TIBC: 234 ug/dL — ABNORMAL LOW (ref 250–470)

## 2010-07-17 LAB — FERRITIN: Ferritin: 708 ng/mL — ABNORMAL HIGH (ref 10–291)

## 2010-07-25 ENCOUNTER — Other Ambulatory Visit (HOSPITAL_COMMUNITY): Payer: Self-pay | Admitting: Oncology

## 2010-07-25 ENCOUNTER — Encounter (HOSPITAL_BASED_OUTPATIENT_CLINIC_OR_DEPARTMENT_OTHER): Payer: PRIVATE HEALTH INSURANCE | Admitting: Oncology

## 2010-07-25 DIAGNOSIS — D649 Anemia, unspecified: Secondary | ICD-10-CM

## 2010-07-25 DIAGNOSIS — N289 Disorder of kidney and ureter, unspecified: Secondary | ICD-10-CM

## 2010-07-25 LAB — CBC WITH DIFFERENTIAL/PLATELET
Basophils Absolute: 0 10*3/uL (ref 0.0–0.1)
EOS%: 2.8 % (ref 0.0–7.0)
LYMPH%: 20.3 % (ref 14.0–49.7)
MCH: 28.5 pg (ref 25.1–34.0)
MCV: 87.8 fL (ref 79.5–101.0)
MONO%: 11.4 % (ref 0.0–14.0)
Platelets: 155 10*3/uL (ref 145–400)
RBC: 3.41 10*6/uL — ABNORMAL LOW (ref 3.70–5.45)
RDW: 18.5 % — ABNORMAL HIGH (ref 11.2–14.5)

## 2010-07-26 ENCOUNTER — Ambulatory Visit
Admission: RE | Admit: 2010-07-26 | Discharge: 2010-07-26 | Disposition: A | Discharge status: Home / Self Care | Payer: MEDICARE | Source: Ambulatory Visit | Attending: Oncology | Admitting: Oncology

## 2010-07-26 DIAGNOSIS — Z1231 Encounter for screening mammogram for malignant neoplasm of breast: Secondary | ICD-10-CM

## 2010-07-29 LAB — GLUCOSE, CAPILLARY: Glucose-Capillary: 107 mg/dL — ABNORMAL HIGH (ref 70–99)

## 2010-07-29 LAB — HM MAMMOGRAPHY: HM Mammogram: NEGATIVE

## 2010-08-01 LAB — GLUCOSE, CAPILLARY: Glucose-Capillary: 106 mg/dL — ABNORMAL HIGH (ref 70–99)

## 2010-08-02 ENCOUNTER — Other Ambulatory Visit: Payer: Self-pay | Admitting: Internal Medicine

## 2010-08-06 LAB — GLUCOSE, CAPILLARY
Glucose-Capillary: 106 mg/dL — ABNORMAL HIGH (ref 70–99)
Glucose-Capillary: 114 mg/dL — ABNORMAL HIGH (ref 70–99)
Glucose-Capillary: 132 mg/dL — ABNORMAL HIGH (ref 70–99)
Glucose-Capillary: 137 mg/dL — ABNORMAL HIGH (ref 70–99)
Glucose-Capillary: 155 mg/dL — ABNORMAL HIGH (ref 70–99)
Glucose-Capillary: 156 mg/dL — ABNORMAL HIGH (ref 70–99)
Glucose-Capillary: 156 mg/dL — ABNORMAL HIGH (ref 70–99)
Glucose-Capillary: 162 mg/dL — ABNORMAL HIGH (ref 70–99)
Glucose-Capillary: 167 mg/dL — ABNORMAL HIGH (ref 70–99)
Glucose-Capillary: 193 mg/dL — ABNORMAL HIGH (ref 70–99)
Glucose-Capillary: 195 mg/dL — ABNORMAL HIGH (ref 70–99)
Glucose-Capillary: 209 mg/dL — ABNORMAL HIGH (ref 70–99)

## 2010-08-06 LAB — POCT I-STAT, CHEM 8
Chloride: 102 mEq/L (ref 96–112)
Glucose, Bld: 162 mg/dL — ABNORMAL HIGH (ref 70–99)
HCT: 31 % — ABNORMAL LOW (ref 36.0–46.0)
Potassium: 4.1 mEq/L (ref 3.5–5.1)

## 2010-08-06 LAB — IRON AND TIBC
Iron: 17 ug/dL — ABNORMAL LOW (ref 42–135)
Saturation Ratios: 10 % — ABNORMAL LOW (ref 20–55)
TIBC: 169 ug/dL — ABNORMAL LOW (ref 250–470)
UIBC: 152 ug/dL

## 2010-08-06 LAB — CARDIAC PANEL(CRET KIN+CKTOT+MB+TROPI)
CK, MB: 0.9 ng/mL (ref 0.3–4.0)
Relative Index: INVALID (ref 0.0–2.5)
Relative Index: INVALID (ref 0.0–2.5)
Total CK: 30 U/L (ref 7–177)
Troponin I: 0.18 ng/mL — ABNORMAL HIGH (ref 0.00–0.06)
Troponin I: 0.19 ng/mL — ABNORMAL HIGH (ref 0.00–0.06)

## 2010-08-06 LAB — CULTURE, BLOOD (ROUTINE X 2): Culture: NO GROWTH

## 2010-08-06 LAB — BASIC METABOLIC PANEL
BUN: 12 mg/dL (ref 6–23)
BUN: 24 mg/dL — ABNORMAL HIGH (ref 6–23)
CO2: 24 mEq/L (ref 19–32)
CO2: 27 mEq/L (ref 19–32)
CO2: 28 mEq/L (ref 19–32)
Calcium: 8.3 mg/dL — ABNORMAL LOW (ref 8.4–10.5)
Calcium: 8.4 mg/dL (ref 8.4–10.5)
Calcium: 8.5 mg/dL (ref 8.4–10.5)
Calcium: 8.7 mg/dL (ref 8.4–10.5)
Calcium: 9 mg/dL (ref 8.4–10.5)
Chloride: 102 mEq/L (ref 96–112)
Chloride: 99 mEq/L (ref 96–112)
Creatinine, Ser: 0.99 mg/dL (ref 0.4–1.2)
Creatinine, Ser: 1.02 mg/dL (ref 0.4–1.2)
Creatinine, Ser: 1.38 mg/dL — ABNORMAL HIGH (ref 0.4–1.2)
GFR calc Af Amer: 39 mL/min — ABNORMAL LOW (ref >60)
GFR calc Af Amer: >60 mL/min (ref >60)
GFR calc non Af Amer: 38 mL/min — ABNORMAL LOW (ref >60)
GFR calc non Af Amer: 39 mL/min — ABNORMAL LOW (ref >60)
GFR calc non Af Amer: 53 mL/min — ABNORMAL LOW (ref >60)
Glucose, Bld: 121 mg/dL — ABNORMAL HIGH (ref 70–99)
Glucose, Bld: 147 mg/dL — ABNORMAL HIGH (ref 70–99)
Glucose, Bld: 208 mg/dL — ABNORMAL HIGH (ref 70–99)
Glucose, Bld: 229 mg/dL — ABNORMAL HIGH (ref 70–99)
Potassium: 3.7 mEq/L (ref 3.5–5.1)
Sodium: 134 mEq/L — ABNORMAL LOW (ref 135–145)
Sodium: 135 mEq/L (ref 135–145)
Sodium: 135 mEq/L (ref 135–145)

## 2010-08-06 LAB — TSH: TSH: 0.474 u[IU]/mL (ref 0.350–4.500)

## 2010-08-06 LAB — COMPREHENSIVE METABOLIC PANEL
BUN: 30 mg/dL — ABNORMAL HIGH (ref 6–23)
Calcium: 8.7 mg/dL (ref 8.4–10.5)
Glucose, Bld: 103 mg/dL — ABNORMAL HIGH (ref 70–99)
Sodium: 135 mEq/L (ref 135–145)
Total Protein: 7.6 g/dL (ref 6.0–8.3)

## 2010-08-06 LAB — LIPID PANEL
HDL: 52 mg/dL (ref >39)
LDL Cholesterol: 81 mg/dL (ref 0–99)
Total CHOL/HDL Ratio: 2.8 RATIO
VLDL: 12 mg/dL (ref 0–40)

## 2010-08-06 LAB — CBC
Hemoglobin: 10.5 g/dL — ABNORMAL LOW (ref 12.0–15.0)
Hemoglobin: 8.4 g/dL — ABNORMAL LOW (ref 12.0–15.0)
Hemoglobin: 9.8 g/dL — ABNORMAL LOW (ref 12.0–15.0)
MCHC: 33.5 g/dL (ref 30.0–36.0)
MCHC: 33.8 g/dL (ref 30.0–36.0)
MCHC: 34 g/dL (ref 30.0–36.0)
MCHC: 34.3 g/dL (ref 30.0–36.0)
MCV: 85.6 fL (ref 78.0–100.0)
MCV: 86.3 fL (ref 78.0–100.0)
MCV: 86.8 fL (ref 78.0–100.0)
Platelets: 143 10*3/uL — ABNORMAL LOW (ref 150–400)
Platelets: 164 10*3/uL (ref 150–400)
Platelets: 165 10*3/uL (ref 150–400)
RBC: 2.88 MIL/uL — ABNORMAL LOW (ref 3.87–5.11)
RBC: 3.43 MIL/uL — ABNORMAL LOW (ref 3.87–5.11)
RDW: 13.8 % (ref 11.5–15.5)
RDW: 13.9 % (ref 11.5–15.5)
RDW: 13.9 % (ref 11.5–15.5)
RDW: 14 % (ref 11.5–15.5)
RDW: 14.1 % (ref 11.5–15.5)
WBC: 6 10*3/uL (ref 4.0–10.5)
WBC: 7.3 10*3/uL (ref 4.0–10.5)
WBC: 7.4 10*3/uL (ref 4.0–10.5)

## 2010-08-06 LAB — DIFFERENTIAL
Basophils Absolute: 0 10*3/uL (ref 0.0–0.1)
Basophils Relative: 0 % (ref 0–1)
Eosinophils Absolute: 0.1 10*3/uL (ref 0.0–0.7)
Eosinophils Relative: 1 % (ref 0–5)
Monocytes Absolute: 1.1 10*3/uL — ABNORMAL HIGH (ref 0.1–1.0)
Monocytes Relative: 10 % (ref 3–12)
Neutro Abs: 8.4 10*3/uL — ABNORMAL HIGH (ref 1.7–7.7)

## 2010-08-06 LAB — APTT: aPTT: 32 seconds (ref 24–37)

## 2010-08-06 LAB — CROSSMATCH: ABO/RH(D): O NEG

## 2010-08-06 LAB — RETICULOCYTES
Retic Count, Absolute: 20.4 10*3/uL (ref 19.0–186.0)
Retic Ct Pct: 0.6 % (ref 0.4–3.1)

## 2010-08-06 LAB — SEDIMENTATION RATE: Sed Rate: 104 mm/hr — ABNORMAL HIGH (ref 0–22)

## 2010-08-08 ENCOUNTER — Encounter (HOSPITAL_BASED_OUTPATIENT_CLINIC_OR_DEPARTMENT_OTHER): Payer: PRIVATE HEALTH INSURANCE | Admitting: Oncology

## 2010-08-08 ENCOUNTER — Other Ambulatory Visit (HOSPITAL_COMMUNITY): Payer: Self-pay | Admitting: Oncology

## 2010-08-08 DIAGNOSIS — D472 Monoclonal gammopathy: Secondary | ICD-10-CM

## 2010-08-08 DIAGNOSIS — D649 Anemia, unspecified: Secondary | ICD-10-CM

## 2010-08-08 DIAGNOSIS — D510 Vitamin B12 deficiency anemia due to intrinsic factor deficiency: Secondary | ICD-10-CM

## 2010-08-08 DIAGNOSIS — N289 Disorder of kidney and ureter, unspecified: Secondary | ICD-10-CM

## 2010-08-08 LAB — COMPREHENSIVE METABOLIC PANEL
ALT: <8 U/L (ref 0–35)
Albumin: 3.7 g/dL (ref 3.5–5.2)
CO2: 24 mEq/L (ref 19–32)
Calcium: 8.6 mg/dL (ref 8.4–10.5)
Chloride: 104 mEq/L (ref 96–112)
Creatinine, Ser: 1.73 mg/dL — ABNORMAL HIGH (ref 0.40–1.20)

## 2010-08-08 LAB — CBC WITH DIFFERENTIAL/PLATELET
Eosinophils Absolute: 0.2 10*3/uL (ref 0.0–0.5)
HCT: 32.4 % — ABNORMAL LOW (ref 34.8–46.6)
LYMPH%: 20.2 % (ref 14.0–49.7)
MONO#: 0.8 10*3/uL (ref 0.1–0.9)
NEUT#: 4.5 10*3/uL (ref 1.5–6.5)
NEUT%: 65.4 % (ref 38.4–76.8)
Platelets: 170 10*3/uL (ref 145–400)
WBC: 6.8 10*3/uL (ref 3.9–10.3)

## 2010-08-08 LAB — LACTATE DEHYDROGENASE: LDH: 127 U/L (ref 94–250)

## 2010-08-19 ENCOUNTER — Other Ambulatory Visit: Payer: Self-pay | Admitting: Orthopedic Surgery

## 2010-08-19 DIAGNOSIS — M79671 Pain in right foot: Secondary | ICD-10-CM

## 2010-08-20 ENCOUNTER — Ambulatory Visit
Admission: RE | Admit: 2010-08-20 | Discharge: 2010-08-20 | Disposition: A | Discharge status: Home / Self Care | Payer: Medicaid Other | Source: Ambulatory Visit | Attending: Orthopedic Surgery | Admitting: Orthopedic Surgery

## 2010-08-20 DIAGNOSIS — M79671 Pain in right foot: Secondary | ICD-10-CM

## 2010-08-22 ENCOUNTER — Other Ambulatory Visit (HOSPITAL_COMMUNITY): Payer: Self-pay | Admitting: Oncology

## 2010-08-22 ENCOUNTER — Encounter (HOSPITAL_BASED_OUTPATIENT_CLINIC_OR_DEPARTMENT_OTHER): Payer: PRIVATE HEALTH INSURANCE | Admitting: Oncology

## 2010-08-22 DIAGNOSIS — D649 Anemia, unspecified: Secondary | ICD-10-CM

## 2010-08-22 LAB — CBC WITH DIFFERENTIAL/PLATELET
BASO%: 0.6 % (ref 0.0–2.0)
Basophils Absolute: 0 10*3/uL (ref 0.0–0.1)
EOS%: 3.2 % (ref 0.0–7.0)
HGB: 11.7 g/dL (ref 11.6–15.9)
MCH: 28.5 pg (ref 25.1–34.0)
MCHC: 32.3 g/dL (ref 31.5–36.0)
RBC: 4.09 10*6/uL (ref 3.70–5.45)
RDW: 17.6 % — ABNORMAL HIGH (ref 11.2–14.5)
lymph#: 1.4 10*3/uL (ref 0.9–3.3)

## 2010-08-24 LAB — GLUCOSE, CAPILLARY: Glucose-Capillary: 46 mg/dL — ABNORMAL LOW (ref 70–99)

## 2010-08-28 ENCOUNTER — Other Ambulatory Visit: Payer: Self-pay | Admitting: *Deleted

## 2010-08-29 LAB — GLUCOSE, CAPILLARY: Glucose-Capillary: 85 mg/dL (ref 70–99)

## 2010-08-30 MED ORDER — GLIPIZIDE 10 MG PO TABS: 10.0000 mg | ORAL_TABLET | Freq: Two times a day (BID) | ORAL | Status: DC

## 2010-09-02 LAB — GLUCOSE, CAPILLARY: Glucose-Capillary: 120 mg/dL — ABNORMAL HIGH (ref 70–99)

## 2010-09-03 LAB — BASIC METABOLIC PANEL
BUN: 35 mg/dL — ABNORMAL HIGH (ref 6–23)
Chloride: 104 mEq/L (ref 96–112)
GFR calc Af Amer: 38 mL/min — ABNORMAL LOW (ref >60)
GFR calc non Af Amer: 32 mL/min — ABNORMAL LOW (ref >60)
Potassium: 4.4 mEq/L (ref 3.5–5.1)
Sodium: 139 mEq/L (ref 135–145)

## 2010-09-03 LAB — POCT HEMOGLOBIN-HEMACUE: Hemoglobin: 9.1 g/dL — ABNORMAL LOW (ref 12.0–15.0)

## 2010-09-03 LAB — GLUCOSE, CAPILLARY: Glucose-Capillary: 120 mg/dL — ABNORMAL HIGH (ref 70–99)

## 2010-09-05 ENCOUNTER — Other Ambulatory Visit (HOSPITAL_COMMUNITY): Payer: Self-pay | Admitting: Oncology

## 2010-09-05 ENCOUNTER — Encounter (HOSPITAL_BASED_OUTPATIENT_CLINIC_OR_DEPARTMENT_OTHER): Payer: PRIVATE HEALTH INSURANCE | Admitting: Oncology

## 2010-09-05 DIAGNOSIS — D510 Vitamin B12 deficiency anemia due to intrinsic factor deficiency: Secondary | ICD-10-CM

## 2010-09-05 DIAGNOSIS — D649 Anemia, unspecified: Secondary | ICD-10-CM

## 2010-09-05 LAB — CBC WITH DIFFERENTIAL/PLATELET
BASO%: 0.8 % (ref 0.0–2.0)
LYMPH%: 23 % (ref 14.0–49.7)
MCHC: 32.3 g/dL (ref 31.5–36.0)
MONO#: 0.8 10*3/uL (ref 0.1–0.9)
MONO%: 12 % (ref 0.0–14.0)
NEUT#: 4.1 10*3/uL (ref 1.5–6.5)
Platelets: 139 10*3/uL — ABNORMAL LOW (ref 145–400)
RBC: 4.34 10*6/uL (ref 3.70–5.45)
RDW: 16.4 % — ABNORMAL HIGH (ref 11.2–14.5)
WBC: 6.8 10*3/uL (ref 3.9–10.3)

## 2010-09-19 ENCOUNTER — Other Ambulatory Visit (HOSPITAL_COMMUNITY): Payer: Self-pay | Admitting: Oncology

## 2010-09-19 ENCOUNTER — Encounter (HOSPITAL_BASED_OUTPATIENT_CLINIC_OR_DEPARTMENT_OTHER): Payer: PRIVATE HEALTH INSURANCE | Admitting: Oncology

## 2010-09-19 DIAGNOSIS — D649 Anemia, unspecified: Secondary | ICD-10-CM

## 2010-09-19 LAB — CBC WITH DIFFERENTIAL/PLATELET
BASO%: 0.4 % (ref 0.0–2.0)
Eosinophils Absolute: 0.2 10*3/uL (ref 0.0–0.5)
MCHC: 32.6 g/dL (ref 31.5–36.0)
MONO#: 0.7 10*3/uL (ref 0.1–0.9)
NEUT#: 4.5 10*3/uL (ref 1.5–6.5)
RBC: 4.13 10*6/uL (ref 3.70–5.45)
WBC: 6.8 10*3/uL (ref 3.9–10.3)
lymph#: 1.3 10*3/uL (ref 0.9–3.3)

## 2010-10-01 NOTE — Assessment & Plan Note (Signed)
[facility]   [name], [name]                 ACCOUNT NO.:  [id number]   MEDICAL RECORD NO.:  [id number]      DATE OF BIRTH:  14-Apr-1935   PHYSICIAN:  Leonie Man, M.D.    VISIT DATE:  01/15/2009                                   OFFICE VISIT   PROBLEM:  Diabetic foot ulcers bilaterally in this 76 year old lady on  the right foot second toe.  There is a 0.2 x 0.3 x less than 0.1 cm  ulcer which is healing quite satisfactorily.  At this point, on the left  plantar surface laterally, there is a 1.5 x 0.8 x 0.1 cm ulcer also with  good granulations throughout the entire base without any odor, drainage  or slough.   EXAMINATION:  Temperature 98.4, pulse 54, respirations 18 and the blood  pressure is 142/71.  The patient does not have any palpable pulses.  However, the wounds are clean and granulating and the extremities are  warm.   TREATMENT:  Today, Promogran and hydrogel on the left plantar foot  ulcer.  On the second toe ulcer on the right foot, we will just put some  hydrogel and a toe sock.  We will plan followup with her in  approximately 1 week.      Leonie Man, M.D.  Electronically Signed     PB/MEDQ  D:  01/15/2009  T:  01/16/2009  Job:  [id number]

## 2010-10-01 NOTE — Assessment & Plan Note (Signed)
[facility]   [name], BEAM                 ACCOUNT NO.:  [id number]   MEDICAL RECORD NO.:  [id number]      DATE OF BIRTH:  1935/01/04   PHYSICIAN:  Joanne Gavel, M.D.         VISIT DATE:  12/06/2008                                   OFFICE VISIT   HISTORY OF PRESENT ILLNESS:  This is a 76 year old female seen multiple  times in this clinic.  She had a recent amputation of the left third  toe.  Her main problem now is a ulceration on the plantar surface at the  left fourth and fifth metatarsal head.  Last week, she had a debridement  by Dr. Cheryll Cockayne and also had callus removed from the tip of the right  second toe.   PHYSICAL EXAMINATION:  Temperature 98.2, pulse 40 and irregular,  respirations 16, blood pressure 115/80.  The patient is in atrial  fibrillation.  The patient has a 1.6 x 1.0 ulceration of the plantar  surface of the left foot.  This is covered with slough which is easily  debrided using a scalpel and a curette without need for anesthesia.   Hemostasis was obtained with pressure and silver nitrate.  The second  toe on the right has reformed callus, but she has refused to let me  debride that.  She says that it is much better and she does not want it  to start bleeding.  An x-ray report of the right foot reveals concern  for osteomyelitis of the head of the third metatarsal.  This is very  confusing because the problem is on the left foot and appears to be the  fourth and fifth metatarsal.  To clarify this problem, we will order an  MRI of the left foot.  Treatment now is dressing the left with hydrogel,  dry dressing, felt pad for offloading and the sandals and a Silopad for  the right second toe.  Follow up to see in 7 days.      Joanne Gavel, M.D.  Electronically Signed     RA/MEDQ  D:  12/06/2008  T:  12/07/2008  Job:  [id number]

## 2010-10-01 NOTE — Assessment & Plan Note (Signed)
[facility]   NAME:  [name], VIAR NO.:  [id number]   MEDICAL RECORD NO.:  [id number]      DATE OF BIRTH:  03-21-1935   PHYSICIAN:  Maxwell Caul, M.D. VISIT DATE:  11/06/2006                                   OFFICE VISIT   The purpose of today's visit is review of a difficult diabetic ulcer  over the plantar aspect of her right first metatarsal head.  She has  continued to be treated with HBO, a Versiva dressing and a Cam Walker.  Unfortunately the amount of drainage in this wound continues to be  excessive.  She apparently had to come back mid week to have this  changed.   On wound exam the wound today was measured at 1.5 x 2.1 x 0.2.  This is  unchanged from previous evaluations.  The base of the wound appears  granulated.  There is epithelialization around the more superficial  circumference of the wound, however, the depth of the wound remains  granulated but without evidence of any progressive healing.  I did probe  around the wound center looking for a sinus, and also around the wound  edges, none was found.  This does not look like a wound that should be  draining this excessively.  There is no cartilage exposure.  There is no  evidence of surrounding cellulitis or infection.  Nothing really  required mechanical debridement today.   WOUND CARE PLAN AND FOLLOWUP:  I have applied Aquacel AG to this wound  covered with absorbent ABD pad.  I am going to continue her in the [employer].  Ultimately, if I can control the drainage I would like to put  her back in a total contact cast.  The problem previously with the total  contact cast was the degree of drainage caused severe maceration of the  surrounding skin, so much so that I was concerned that we would develop  additional wounds.  We will do a further review of her wound on Monday  if the drainage can be controlled, then we will perhaps put her in a  total contact cast after  her exam on Monday.  Ultimately, an Apligraf  would come to mind in this area if we can get a better wound bed.           ______________________________  Maxwell Caul, M.D.     MGR/MEDQ  D:  11/06/2006  T:  11/07/2006  Job:  [id number]

## 2010-10-01 NOTE — Assessment & Plan Note (Signed)
[facility]   NAMEJILLIANE, [name]                 ACCOUNT NO.:  [id number]   MEDICAL RECORD NO.:  [id number]      DATE OF BIRTH:  1934-12-03   PHYSICIAN:  Jonelle Sports. Sevier, M.D.  VISIT DATE:  12/20/2008                                   OFFICE VISIT   HISTORY:  This is a 76 year old black female who is being followed for  diabetic ulcers on the tip of the right second toe which appears to be a  Wagner II and another underlying the plantar aspect of the third and  fourth metatarsal head areas of the left foot which clearly is  associated on imaging with underlying osteomyelitis and therefore, is a  Educational psychologist III.  She has been on doxycycline 100 mg p.o. b.i.d. with  arrangements underway to get hyperbaric oxygen started as soon as  possible.   Today, her blood pressure is 191/70, pulse 54, respirations 18,  temperature 98.4.  Her left ulcer measures 1.5 x 0.9 x 0.1 cm and her  right 0.1 x 0.3 x 0.1 cm.  Each is debrided of some surrounding callus  in the skin margins with minimal bleeding controlled by pressure.   IMPRESSION:  Status quo, needs to continue antibiotics and also to  undergo a course of hyperbaric oxygen therapy.   She will do this.  In the meanwhile, dressing the wounds with triple  antibiotic ointment in the case of the right toe and silver alginate  with the left plantar ulcer, keeping both protectively dressed.  She  will consult today with the HBO tech here regarding getting such therapy  started as soon as possible.           ______________________________  Jonelle Sports. Cheryll Cockayne, M.D.     RES/MEDQ  D:  12/20/2008  T:  12/20/2008  Job:  [id number]

## 2010-10-01 NOTE — Assessment & Plan Note (Signed)
[facility]   NAMERAINELLE, [name] NO.:  [id number]   MEDICAL RECORD NO.:  [id number]      DATE OF BIRTH:  12-23-1934   PHYSICIAN:  Maxwell Caul, M.D. VISIT DATE:  10/09/2006                                   OFFICE VISIT   PURPOSE OF TODAY'S VISIT:  [name] was seen today in conjunction with  her HBO, which she tolerated without difficulty.  She has continued to  follow for a ulcer over her right first metatarsal head.  Today's  dimensions were 1.6 x 2 x 0.3.  There is no current evidence of  infection here.  She is afebrile and does not have any specific  complaints.  The wound bed underwent a full-thickness debridement with  EMLA for anesthesia.  Hemostasis was with direct pressure and a silver  nitrate stick.  We used a #15 blade.  The base of the wound is  granulating nicely.  She has some peripheral epithelialization.  All of  this is compatible with improving wound.   WOUND CARE PLAN AND FOLLOWUP:  We used Aquacel AG and continued her on a  total contact cast.  She will be followed in conjunction with HBO next  week.           ______________________________  Maxwell Caul, M.D.     MGR/MEDQ  D:  10/09/2006  T:  10/09/2006  Job:  [id number]

## 2010-10-01 NOTE — Assessment & Plan Note (Signed)
[facility]   NAME:  RISSA, TURLEY NO.:  [id number]   MEDICAL RECORD NO.:  [id number]      DATE OF BIRTH:  [birth year]   PHYSICIAN:  Ardath Sax, M.D.           VISIT DATE:                                   OFFICE VISIT   [name] has diabetic ulcers on both feet.  The one on her second toe  is just about healed.  It is only 5 or 6 mm in diameter, and it is being  treated with Neosporin.  I did not have to debride it.  It is really  nice and clean with good granulation tissue and much smaller.  The last  time, she was here was a centimeter, so we just continued with Dr.  Norman Clay treatment with Neosporin and a Band-Aid.  On the left plantar  aspect, the ulcer is about a centimeter in diameter, and we will  continue to silver alginate dressing and bulky dressing.  We also put a  wedge in her boot, so that she will have less callus formation being  formed.  I had Dr. Talmage Nap look at it in terms of whether to do hypobaric  and he did not think it was necessary.  It was clean and it was healing  and there was no osteo on the x-ray, so she will come back in a week.      Ardath Sax, M.D.     PP/MEDQ  D:  12/27/2008  T:  12/27/2008  Job:  [id number]

## 2010-10-01 NOTE — Assessment & Plan Note (Signed)
[facility]   NAME:  [name], [name]                 ACCOUNT NO.:  [id number]   MEDICAL RECORD NO.:  [id number]      DATE OF BIRTH:  Nov 02, 1934   PHYSICIAN:  Theresia Majors. Tanda Rockers, M.D. VISIT DATE:  10/26/2007                                   OFFICE VISIT   SUBJECTIVE:  [name] is a 76 year old lady who we have treated for  Wagner III diabetic foot ulcer involving the right fifth distal  interphalangeal joint.  We have treated her with course of hyperbaric  oxygen treatment and antibiotics.  She was last seen on October 19, 2007.  We discontinued her antibiotics, placed her in an offloading sandal and  instructed her and encouraged her practice of a routine diabetic foot  care.  She returns for follow-up.  There has been no interim drainage,  malodor, pain, or fever.  She continues to be ambulatory.   OBJECTIVE:  Blood pressure is 147/73, respirations 16, pulse rate 51,  temperature 98.3, capillary blood glucose is 100 mg percent.  Inspection  of the right foot shows that the previous wound is completely resolved.  There is no evidence of hyperemia.  The foot is warm but it is not  feverish.  There is no edema.   ASSESSMENT:  Resolved Wagner III diabetic foot ulcer.   PLAN:  We placed the patient in her custom orthotics.  We have  instructed her to inspect her feet frequently, to wear the cotton socks  if she notices any drainage or experiences pain, swelling or is  concerned in anyway.  She is to contact her primary care physician and  she may feel free to call the [facility]ound [facility] for advice.  We have given  her an opportunity to ask questions.  She expresses gratitude for having  been seen in the clinic and indicates that she will be compliant.      Harold A. Tanda Rockers, M.D.  Electronically Signed     HAN/MEDQ  D:  10/26/2007  T:  10/27/2007  Job:  [id number]   cc:   Fanny Bien. Tuchman, D.P.M.

## 2010-10-01 NOTE — Assessment & Plan Note (Signed)
[facility]   NAMETONEISHA, SAVARY                 ACCOUNT NO.:  [id number]   MEDICAL RECORD NO.:  [id number]      DATE OF BIRTH:  1934-07-15   PHYSICIAN:  Theresia Majors. Tanda Rockers, M.D. VISIT DATE:  12/04/2006                                   OFFICE VISIT   SUBJECTIVE:  Ms. Kindley is a 76 year old lady who is undergoing  hyperbaric oxygen treatment for Wagner grade 3 diabetic foot ulcer  involving the right metatarsal.  She has completed her 16th dive out of  an ordered 30 dives today.  She returns for a wound assessment.  In the  interim, she has noted a blister on the left 5th phalanx.   OBJECTIVE:  Blood pressure is 159/67, pulse rate 67, respirations 16,  temperature 98.1.  Capillary blood glucose post HBO is 158 mg%.  Inspection of the right foot shows that there has been contraction of  the wound with healthy granulation tissue.  There is a persistent amount  of moisture on the wound, but there is no malodor and there is no  drainage.  There is no evidence of ischemia, although the pedal pulses  are indeterminate.  On the left foot at the lateral metatarsal  phalangeal joint is a blister that extends into the subcutaneous tissue  and is juxtaposed to the joint capsule.  This wound underwent a full-  thickness debridement with a 10 blade; the patient tolerated this well.  Hemorrhage was controlled with direct pressure.  All of the nonviable  tissue was removed.   ASSESSMENT:  Satisfactory response to hyperbaric oxygen treatment and a  new Wagner grade 3 wound on the left 5th digit.   PLAN:  We have empirically start the patient on doxycycline 100 mg  b.i.d.  We dressed the new wound with a silver matrix dressing.  Wound  #1 we will use an Iodoflex gauze to control the maceration and return  her to a total contact cast.  We will continue her HBO therapy as  previously ordered.  We will do wound assessment in 5 days.  In the  interim, we will change the dressing  to wound #2 every other day during  her HBO while maintaing off loading with the healing sandal..      Harold A. Tanda Rockers, M.D.  Electronically Signed     HAN/MEDQ  D:  12/04/2006  T:  12/05/2006  Job:  [id number]

## 2010-10-01 NOTE — Assessment & Plan Note (Signed)
[facility]   NAMEKRISTYANA, [name]                 ACCOUNT NO.:  [id number]   MEDICAL RECORD NO.:  [id number]      DATE OF BIRTH:  20-May-1934   PHYSICIAN:  Theresia Majors. Tanda Rockers, M.D. VISIT DATE:  01/20/2007                                   OFFICE VISIT   SUBJECTIVE:  [name] is a 76 year old lady who we have treated for  Wagner grade 3 diabetic foot ulcer.  In the interim, her total contact  cast has been discontinued and she returns for follow-up.  There has  been no excessive drainage, malodor, pain or fever.   OBJECTIVE:  Blood pressure is 154/67, respirations 16, pulse rate 51,  temperature is 98.1.  The capillary blood glucose is 90 mg percent.  Inspection of the right foot plantar surface and the left fifth toe  shows that there are well adherent eschars with no drainage and no  evidence of infection.   ASSESSMENT:  Resolved wounds.   PLAN:  We are discharging [name] from the wound center.  We have  instructed her to procure and wear the custom shoes and inserts.  She  had, in her possession, photographs of various shoes from an unknown  vender.  We counseled her regarding our preference that she be fitted at  [employer], not necessarily for the shoe primarily but for the insert which  will provide her her offloading features.  We have reviewed the contents  of a patient information brochure regarding offloading in terms that she  seems to understand.  The patient is discharged.  She indicates that she  will make her appointments for custom shoes and inserts with [employer] as  soon as possible.  We have encouraged her to continue her routine  diabetic foot care and if she has any difficulty whatsoever to give [place] a  call.  If she notices recurrent breakdown, she is to definitely give [place]  a call.      Harold A. Tanda Rockers, M.D.  Electronically Signed     HAN/MEDQ  D:  01/20/2007  T:  01/20/2007  Job:  [id number]

## 2010-10-01 NOTE — Assessment & Plan Note (Signed)
[facility]   NAME:  [name], [name]                 ACCOUNT NO.:  [id number]   MEDICAL RECORD NO.:  [id number]      DATE OF BIRTH:  1935/02/23   PHYSICIAN:  Theresia Majors. Tanda Rockers, M.D. VISIT DATE:  09/28/2007                                   OFFICE VISIT   SUBJECTIVE:  Ms. Student is a 76 year old lady with a Wagner III diabetic  foot ulcer involving the right fifth digit.  In the interim, we have  treated her with daily irrigations and a 1/4-inch ribbon packing.  There  has been no excessive drainage, malodor, pain, or fever.  She continues  to be ambulatory wearing the bulky dressing and offloading sandal.  She  has continued on Augmentin 875/125 p.o. b.i.d.  She is to begin her  hyperbaric oxygen treatment today at 2.4 atmospheres of 100% oxygen with  two 5-minute air breaks.  She has been ordered 30 treatments.   OBJECTIVE:  Blood pressure 166/77, respirations 18, pulse rate 50, and  temperature is 98.  Inspection of the distal interphalangeal joint. A  curette  of the Rett was used to debride the wound of chronic  inflammatory tissue.  There was no malodor. The foot appeared to be well  perfused.  There was no evidence of associated ischemia, ascending  cellulitis, or lymphangitis.  The area on the right first met head has  essentially resolved.   ASSESSMENT:  Persistent Wagner III diabetic foot ulcer.   PLAN:  We will proceed with hyperbaric oxygen therapy as per orders.  We  will continue daily saline irrigations and convert to a plain  quarter  inch new gauze packing.  We will reevaluate the patient after 5 HBO  treatments.      Harold A. Tanda Rockers, M.D.  Electronically Signed     HAN/MEDQ  D:  09/28/2007  T:  09/29/2007  Job:  [id number]

## 2010-10-01 NOTE — Consult Note (Signed)
[name], [name]                 ACCOUNT NO.:  [id number]   MEDICAL RECORD NO.:  [id number]          PATIENT TYPE:  REC   LOCATION:  FOOT                         FACILITY:  [facility]   PHYSICIAN:  Jonelle Sports. Sevier, M.D. DATE OF BIRTH:  15-May-1935   DATE OF CONSULTATION:  DATE OF DISCHARGE:                                 CONSULTATION   HISTORY:  This 76 year old black female has been a patient of this  clinic in the past, last seen here and released in September 2008  following a course of therapy to include hyperbaric oxygen for a  diabetic foot ulcer on the plantar aspects of the left foot.   Apparently at that time, she did have a small wound on the lateral fifth  toe, which was covered with eschar and was felt to have resolved  satisfactorily as well.   The patient reports that in the interim since that last visit, she has  had no hospitalizations and no new major diagnosis, no significant  change in medications aside from the addition several days ago of an  antibiotic by Dr. Leeanne Deed.   Approximately 3 weeks ago, she noted the presence of an open wound with  limited drainage on the lateral aspect of the right fifth toe.  She  consulted Dr. Leeanne Deed, podiatrist on April 7 for nail care and he  discovered the presence of an open wound on the right fifth toe.  He x-  rayed that foot at that time and the x-ray, we were told showed probably  getting bone lysis consistent with osteomyelitis in that left fifth toe.  Because of this, he did not provide the toe nail care but referred her  here for further evaluation and advice.   The patient has multiple medical problems to include asthma,  hypertension, atrial fibrillation, anemia, type 2 diabetes, and  congestive heart failure with recurrent pleural effusions.  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   She is not known to be allergic to any medications.  Her regular  medicines include Lasix,  metoprolol, Januvia, Synthroid, lisinopril, and  since her visit with Dr. Leeanne Deed the addition of Augmentin 875 mg twice  daily.   The patient reports that there has been no pain in this toe.  She has  been aware of a bit of drainage.  There has been no odor.  There has  been no spreading erythema and no fever or systemic symptoms.   Dr. Leeanne Deed apparently did not do culture prior to placing her on the  Augmentin as indicated above.   PHYSICAL EXAMINATION:  Blood pressure is 134/69, pulse 58 and irregular,  respirations 18, temperature 97.7, and capillary blood glucose 103  mg/dL.  She is an elderly black female appearing perhaps slightly older  than her stated age and appearing chronically ill.  She is in no  immediate distress.   Extremities  show 1-2 plus edema bilaterally.  Pulses are palpable on  the dorsalis pedis positions through that edema.  Her nails are mycotic  and hypertrophic  on virtually all toes of both feet but with no acute  problem there.   On the lateral aspect of the right fifth toe overlying approximately  proximal interphalangeal joint is an open wound measuring 0.2 x 0.2 x  0.4 cm in depth.   There is noted to be some undermining around this wound and it probes to  about 0.4 cm onto the dorsal aspect of the foot above the proximal  phalanx and PIP joint.   IMPRESSION:  Diabetic foot ulcer, Wagner stage III, right fifth toe.   DISPOSITION:  1. The wound is debrided today of full-thickness skin and some      subcutaneous tissue to get rid of some of the undermining.  I did      not attempt to unroof the entire tract that leads up over to the      dorsum of the toe.  I did saucerize the primary wound site,      however.   It bled briskly during this procedure and there was no evidence of  purulence and the procedure was not painful to the patient.  No  anesthetic was used.   Because the patient has been on antibiotics,  there is no actual  purulence.   Culture was not made at this time.   The wound was then dressed with an application of Iodosorb gel and  covered with a dry dressing.   The patient is instructed to remove that dressing in 3 days to cleanse  the wound gently with warm soapy water and to rinse it well and patted  dry and then to replace simply a dry dressing.   She is given a followup visit here for 6 days to be seen by Dr. Tanda Rockers  for consideration of possible repeat hyperbaric oxygen therapy.  In the  meanwhile, efforts will be made to obtain the x-ray from Dr. Theotis Burrow  office.           ______________________________  Jonelle Sports. Cheryll Cockayne, M.D.     RES/MEDQ  D:  09/01/2007  T:  09/02/2007  Job:  [id number]

## 2010-10-01 NOTE — Assessment & Plan Note (Signed)
[facility]   NAME:  RAILEIGH, SABATER                 ACCOUNT NO.:  [id number]   MEDICAL RECORD NO.:  [id number]      DATE OF BIRTH:  Dec 27, 1934   PHYSICIAN:  Theresia Majors. Tanda Rockers, M.D. VISIT DATE:  09/07/2007                                   OFFICE VISIT   SUBJECTIVE:  Ms. Boutwell is a 76 year old lady whom we are following for a  Wagner 3 diabetic foot ulcer involving the fifth digit of the right  foot.  In the interim, the patient has been  taking p.o. antibiotics  Augmentin 875 mg twice daily and using Iodosorb gel with her dressing  changes.  She continues to be ambulatory and is wearing her custom  inserts and extra-depth shoes.  She continues to have some drainage.  There is been no fever and there has been no pain.   OBJECTIVE:  VITAL SIGNS:  Blood pressure is 160/81, respirations 18,  pulse rate 57, temperature 97.1 and capillary blood glucose is 113 mg  percent.  MUSCULOSKELETAL:  Inspection of the lower extremity shows that there is  3+ edema with some slight tenderness in the soleus and in the inferior  aspect of the popliteal fossa.  The capillary refill is brisk.  There  are faintly palpable dorsalis pedis pulses in spite of the edema.  Inspection of the fifth digit shows that there is an easily sounding to  what appears to be the distal interphalangeal joint.  There is serous  drainage.  There is erosion and disruption of articular surface.  Using  forceps free  bony fragments were easily extracted.  These fragments  were forwarded to pathology for exam and a culture was taken from the  depths of the wound.  A curette was used to curette cleanly the wound  surface.  Thereafter, saline was used to copiously irrigate the  resulting cavity and a 1/4 inch iodoform gauze was packed into the  wound.  There is no evidence of ascending infection or abscess  formation.  The patient remains anesthetic in the right foot.   ASSESSMENT:  [name] 3 diabetic foot ulcer  involving the fifth distal  interphalangeal joint.   PLAN:  The patient will continue on her antibiotics.  We will reevaluate  her in 48 hours to remove her packing.  This patient will likely be a  candidate for hyperbaric oxygen treatment to be added as an adjunct to  the continued offloading serial debridements and infection control.  We  have explained this approach to the patient in terms that she seems to  understand.  In the interim, we will proceed with repeating her  segmental arterial Doppler's.  We will obtain a venous Doppler to rule  out the possible concurrence of a deep venous thrombosis.  We will  reevaluate the patient in September 09, 2007.      Harold A. Tanda Rockers, M.D.  Electronically Signed     HAN/MEDQ  D:  09/07/2007  T:  09/08/2007  Job:  [id number]

## 2010-10-01 NOTE — Assessment & Plan Note (Signed)
[facility]   NAMEADYLYNN, [name]                 ACCOUNT NO.:  [id number]   MEDICAL RECORD NO.:  [id number]      DATE OF BIRTH:  [birth year]   PHYSICIAN:  Maxwell Caul, M.D. VISIT DATE:  12/10/2006                                   OFFICE VISIT   Ms. Herzig is a 76 year old woman who is undergoing hyperbaric oxygen  treatment for a Wagner's grade III diabetic foot ulcer involving her  right foot.  She also has recently developed a blister on her left first  toe.  In the interim, she has been treated with  Iodoflex, SofSorb, and  a total contact cast on the right.  She has been wearing a healing  sandal on the left and I believe she is on  __________  21/30 in terms  of her HBO.   On examination, the wound on her first metatarsal head certainly looks  much better than when I was this last.  There was a thin coat of eschar  over this wound that I did a full thickness debridement on.  We used a  #15 blade, no anesthesia was necessary.  Hemostasis was with direct  pressure.  The base of the wound is well granulated.  She has advancing  epithelium and the wound actually looks really quite remarkably better  than the last time I saw this.  The wound over the left first toe is not  open.  I do not think anything else specifically needs to be done to  this.  There is still some blister present, however, there was no  evidence of cellulitis or infection.   Wound care plan and followup:  The patient is to continue with her HBO.  We have applied Iodosorb gel, SofSorb pad, and a total contact cast to  the right foot.  She is to continue in the left healing sandal.  She  asked me about coming out of the total contact cast to attend a function  this weekend.  I have advised against this.  We are making progress with  this wound and I certainly would not want to put this improvement at  risk.  We will see her again in conjunction with her HBO.     ______________________________  Maxwell Caul, M.D.     MGR/MEDQ  D:  12/11/2006  T:  12/11/2006  Job:  [id number]

## 2010-10-01 NOTE — Assessment & Plan Note (Signed)
[facility]   NAME:  RISA, AUMAN NO.:  [id number]   MEDICAL RECORD NO.:  [id number]            DATE OF BIRTH:   PHYSICIAN:  Maxwell Caul, M.D. VISIT DATE:  10/16/2006                                   OFFICE VISIT   PURPOSE OF TODAY'S VISIT:  Ms. Huebsch was seen today for a wound eval.  This was originally scheduled to be a post HBO wound eval; however, she  was delayed in a previous doctor's appointment for followup of her  diabetes.  We are following her for continued difficulties with a  diabetic foot ulcer over her right first metatarsal head.  Most  recently, she had been treated with HBO, Aquacel AG and a total contact  cast.  The Aquacel AG was chosen because of a moderate amount of  drainage.   WOUND EXAMINATION:  The wound, itself, did not look particular  problematic; however, there was surrounding maceration of the skin and a  really significant amount of drainage.  The dimensions of the wound were  1.5 x 2.0 x 0.3.  There was only a minimal amount of epithelization.  The wound itself was dry when I saw it and there was tissue maceration  probably caused by the drainage and the Aquacel.   WOUND CARE PLAN AND FOLLOWUP:  We have changed her to ActiSorb Silver  and replaced the total contact cast.  She will be seen next week in  conjunction with HBO.  Otherwise, she appeared stable today.  Temperature 97.7.  No evidence of cellulitis.           ______________________________  Maxwell Caul, M.D.     MGR/MEDQ  D:  10/16/2006  T:  10/17/2006  Job:  [id number]

## 2010-10-01 NOTE — Assessment & Plan Note (Signed)
[facility]   NAME:  [name], [name]                 ACCOUNT NO.:  [id number]   MEDICAL RECORD NO.:  [id number]      DATE OF BIRTH:  July 24, 1934   PHYSICIAN:  Theresia Majors. Tanda Rockers, M.D. VISIT DATE:  10/12/2007                                   OFFICE VISIT   SUBJECTIVE:  [name] is a 76 year old female who we are following for  a Wagner III diabetic foot ulcer involving her right fifth digit.  She  has been on Augmentin 875/125 b.i.d. and continues on hyperbaric oxygen  adjunctive therapy.  She has completed 7/30 treatments as of today.  There has been no excessive pressure symptoms with her HBO treatments.  She has a pending consultation with ENT.  There are no symptoms of  claustrophobia or oxygen toxicity.  There has been no fever or  malodorous drainage.  Her capillary blood glucose has been 189 mg  percent.   OBJECTIVE:  VITAL SIGNS:  Blood pressure is 153/66, respirations 16,  pulse rate 63, and temperature 97.9.  EXTREMITIES:  Inspection of the right lower extremity shows that there  is trace edema.  There is no hyperemia or malodorous drainage.  The foot  is well perfused.  Capillary refill is brisk.  There is no evidence of  ischemia nor infection.  The wound itself has a moderate amount of  callus and desquamation, which was sharply excised with scissors and  forceps.  Thereafter, the wound was probed with Q-tip and it does not  extend into the bony interface or interphalangeal joint.  The wound was  copiously irrigated with saline.  There is no pain or hyperemia.   ASSESSMENT:  Clinical improvement with continuation of hyperbaric oxygen  therapy as well as antibiotics.   PLAN:  We will continue offloading with a modified-healing sandal along  with antibiotics and HBO.  We will reevaluate the patient after an  additional 5 treatments.      Harold A. Tanda Rockers, M.D.  Electronically Signed     HAN/MEDQ  D:  10/12/2007  T:  10/13/2007  Job:  [id number]

## 2010-10-01 NOTE — Assessment & Plan Note (Signed)
[facility]   NAMESOWMYA, PARTRIDGE                 ACCOUNT NO.:  [id number]   MEDICAL RECORD NO.:  [id number]      DATE OF BIRTH:  Apr 16, 1935   PHYSICIAN:  Theresia Majors. Tanda Rockers, M.D. VISIT DATE:  09/09/2007                                   OFFICE VISIT   Ms. Rhames is a 76 year old lady who we are treating for Wagner III  diabetic foot ulcer involving the fifth digit of the right foot.  In the  interim, she has had a duplex scan.  Her cultures remain outstanding.  There has been no excessive drainage, malodor, pain, or fevers.  She  continues to be ambulatory.  She reports that there has been decrease in  the swelling.   OBJECTIVE:  Blood pressure is 177/67, respirations 18, pulse rate 55,  temperature 98, capillary blood glucose is 122 mg percent.  Inspection  of the right foot shows that there has been some decrease in the edema  and hyperemia.  The packing was removed from the right fifth digit.  There is no purulence or no malodor.  The foot is warm but it is not  feverish.  Capillary refill is brisk.  There is a palpable pulse.  The  edema is 1+ to 2+.  There is no tenderness in the popliteal fossa of the  soleus area.  The patient remains anesthetic.  Review of the duplex scan  report shows no evidence of deep venous thrombosis.   ASSESSMENT:  Clinical improvement Wagner III diabetic foot ulcer.   PLAN:  We will continue the packing with the iodoform quarter-inch  gauze, a bulky dressing, and offloading healing sandal.  We will  reevaluate the patient on Monday.   In the interim, if the patient develops increasing drainage, redness, or  fever whatsoever, she will be seen in the emergency room.  We renewed  her prescription for Augmentin 850 mg p.o. b.i.d.      Harold A. Tanda Rockers, M.D.  Electronically Signed     HAN/MEDQ  D:  09/09/2007  T:  09/10/2007  Job:  [id number]

## 2010-10-01 NOTE — Assessment & Plan Note (Signed)
[facility]   NAME:  [name], [name]                 ACCOUNT NO.:  [id number]   MEDICAL RECORD NO.:  [id number]      DATE OF BIRTH:  [birth year]   PHYSICIAN:  Theresia Majors. Tanda Rockers, M.D. VISIT DATE:  09/20/2007                                   OFFICE VISIT   SUBJECTIVE:  [name] is a 76 year old female with a Wagner 3 diabetic  foot ulcer involving the right fifth digit.  In the interim, we have  treated her with a new gauze 1/4-inch ribbon packing and offloading  healing sandal.  She continues to be ambulatory.  She has an additional  wound #5 on the volar aspect of the right foot.  She continues on  Augmentin 875/125 p.o. b.i.d.  There has been no interim fever or  drainage.  The pack indeed dislodged itself, and the patient removed it  24 hours ago.   OBJECTIVE:  VITAL SIGNS:  Blood pressure is 145/70, respirations are 18,  pulse rate 75, and temperature 97.9.  capillary blood glucose is 112 mg  percent.  EXTREMITIES:  Inspection of the right foot shows mild hyperemia.  The  ulcer extending to the distal interphalangeal joint remains open with a  serous drainage and no malodor.  The foot is warm, but it is not  feverish.  There is no evidence of attendant ischemia.  Wound #5 on the  volar aspect of the right foot shows 100% granulation.  No evidence of  infection.   ASSESSMENT:  Clinical improvement.   PLAN:  We will continue the patient with an every-other-day saline  irrigation of this wound and a 1/4-inch ribbon gauze packing.  We will  continue local antibacterial soap washing and offloading with a bulky  dressing.  We will continue her Augmentin 875/125 p.o. b.i.d. for an  additional 2 weeks.  We are proceeding with hyperbaric oxygen at 2.4  atmospheres for 90 minutes 100% oxygen with two 5-minute air breaks as  soon as possible.      Harold A. Tanda Rockers, M.D.  Electronically Signed     HAN/MEDQ  D:  09/20/2007  T:  09/20/2007  Job:  [id number]

## 2010-10-01 NOTE — Assessment & Plan Note (Signed)
[facility]   [name], [name]                 ACCOUNT NO.:  [id number]   MEDICAL RECORD NO.:  [id number]      DATE OF BIRTH:  Dec 04, 1934   PHYSICIAN:  Jonelle Sports. Sevier, M.D.  VISIT DATE:  11/29/2008                                   OFFICE VISIT   This 76 year old black female who has been followed in this clinic off  and on for a number of months for a diabetic foot ulcer on lateral  aspect of the fifth toe on the right.  In the past prior to that, she  had actually had a ray amputation of the third toe on the left foot.   We were able to successfully to treat her right ulcer through use of  hyperbaric oxygen and to get her healed.  She was last seen in this  clinic in June 2009.   Somehow she apparently got into trouble in late 2009 and went up with a  deeply infected foot and underwent a ray resection of the third toe of  the right foot.  That apparently healed reasonably well.  Until  recently, she had a blowup and swelling of the distal phalanx of her  right second toe with a small ulcer on this plantar aspect.  She was  seen and treated by Dr. Magnus Ivan, orthopedist who gave her some  antibiotics, did not find evidence for osteomyelitis and use limited  topical measures.  Meanwhile, she has also developed plantar ulcer on  the left foot underlying the fourth metatarsal head.   She was referred today for our evaluation and advice regarding both of  those problems.   INTERIM HISTORY:  The patient says she has not been hospitalized since  she was last seen here in October 27, 2007 and indeed that her amputation  was done as an outpatient or short-stay and she was sent home in January  2010.  She has avoided any major medical disaster during that same  period of time, but has continue with her usual chronic medical  problems.  She does have diabetes and is unaware of how satisfactory her  recent control might have been.   She has no known medicinal  allergies.   REGULAR MEDICATIONS:  1. Synthroid 112 mcg daily.  2. Pravastatin 20 mg daily.  3. Metoprolol 50 mg daily.  4. Januvia 100 mg daily.  5. Glipizide 10 mg daily.  6. Furosemide 40 mg daily.  7. Lisinopril 10 mg daily.  8. She was recently given silver sulfa cream as well as doxycycline      100 mg b.i.d. for her current foot ulceration issues.  She will      complete that doxycycline apparently the day following.  There was      no culture made and this was just given empirically.   The patient is known to have atrial fibrillation, but is not a  chronically anticoagulated.   EXAMINATION:  Blood pressure is 131/90, pulse is 115 and quite erratic  in the pattern of atrial fibrillation, respirations are 18, temperature  is 98.2.  Capillary blood glucose 98 mg/dL.   The patient is obese, restricted her mobility (using a cane), and it  clearly appears at least her stated  age.  She is in no immediate  clinical distress at this time.  Her heart is quite erratic in the  pattern of atrial fibrillation, unable to assess its size or hear any  murmurs or anything of that nature on limited examination.  Her  extremities are both mildly edematous.  She has surgical absence of the  third toe on each foot and the right second toe has sort of a malleted  deformity of the distal tuft and indeed a plantar ulcer on that tuft.  There does not appear to be deep penetration there and there is no  erythema or whatever strange deformity of the distal tuft.   On the left foot, there is a plantar ulceration underlying the fourth  metatarsal head area, which measures 1.7 x 0.9 x 0.3 cm and is somewhat  undermined particularly at its distal margin.   ADMITTING IMPRESSION:  Bilateral diabetic foot wounds both apparently at  this stage Wagner stage II.   DISPOSITION:  The patient is also noted to have a number of really sharp  jagged nails capable of rendering trauma to the adjacent toes and  these  today are reduced mechanically with satisfaction and without  complication.   The wound on the plantar aspect of the distal tuft of the right second  toe has a thin layer of skin over it, which is not intact and not to  stuck to the subcutaneous tissues and this was accordingly debrided away  leaving a clean ulcer base, which measures 0.4 x 0.3 x 0.2 cm.  There  was considerable callus around the cap of that toe and that is sharply  pared away as well.   The left plantar wound is addressed by excising wound margins to  obliterate the undermining, although it is tiny bit of such remains at  the distal margin of the wound despite my efforts.   Handheld Doppler exams were done and these confirm the bizarre atrial  fibrillation rhythm, but they also show monophasic and continuous  signals at the dorsalis pedis areas bilaterally and cannot be located at  the posterior tibial areas.   IMPRESSION:  Diabetic foot ulcers, bilateral.   DISPOSITION:  The toenail was clipped away as indicated above and a lot  of callus removed from the tip of the right second toe, which surrounds  ulcer.  That ulcer was then treated with a Silopad that should be  requires to healing if she will continue to wear it properly.   The ulcer on the left foot was debrided as indicated above and then  treated with application of hydrogel and dry dressing and the entire  forefoot is fitted with a felt pad, which was glued to the plantar  aspect of the foot covering the metatarsal head areas except where the  wound was located and also extending proximal to these metatarsal heads.  This should give her some degree of offloading.  Her gait and  circumstances were so unstable that I do not think she could safely wear  any kind of platform footwear and with her arterial status somewhat  iffier, I certainly do not think she should be attempted into a total  contact cast.   The patient will be placed in a flat  healing sandal with the felt pad as  indicated above and she will be instructed not to change the dressing on  that wound until she was seen again here in 1 week.   She was instructed that  she may wash the second toe on the right daily  and dried and allowed to dry thoroughly and then replaced the Silopad.   Her followup visit will be here in 1 week.  In the meanwhile, we will  make efforts to obtain from Dr. Magnus Ivan any recent x-rays that he may  have done and also any additional information he may have pertaining to  the patient's conditions and any vascular studies she might have had.   Meanwhile, followup visit will be here in 1 week.           ______________________________  Jonelle Sports. Cheryll Cockayne, M.D.     RES/MEDQ  D:  11/29/2008  T:  11/30/2008  Job:  [id number]

## 2010-10-01 NOTE — Assessment & Plan Note (Signed)
[facility]   NAMECANDA, PODGORSKI                 ACCOUNT NO.:  [id number]   MEDICAL RECORD NO.:  [id number]      DATE OF BIRTH:  [birth year]   PHYSICIAN:  Theresia Majors. Tanda Rockers, M.D. VISIT DATE:  11/19/2006                                   OFFICE VISIT   SUBJECTIVE:  Ms. Kamps is a 76 year old female who has undergone  hyperbaric oxygen treatment for Wagner grade 3 right diabetic foot  ulcer.  She has completed a total of seven dives out of the total  ordered of 30 dives.  She returns for wound assessment.  In the interim  she denies excessive drainage, malodor, pain, or fever.   OBJECTIVE:  Blood pressure is 147/55, pulse rate of 57, respirations 16,  temperature 97.9.  Capillary blood glucoses 143 mg percent post HBO.  Inspection of the right foot shows that the edema is well controlled.  There is no evidence of ischemia.  The wound itself is 100% granulated  with advancing epithelium and contraction.  There is no malodor.  There  continues to be a moderate amount of maceration from drainage which has  been wicked away from the wound.   ASSESSMENT:  Clinical improvement, responding to HBO treatment.   PLAN:  We will continue the hyperbaric oxygen treatment.  In addition,  we will place her in an Iodoflex dressing with SofSorb and extra  padding.  We will continue the total contact cast.  We will do another  wound assessment in 5 days.      Harold A. Tanda Rockers, M.D.  Electronically Signed     HAN/MEDQ  D:  11/19/2006  T:  11/19/2006  Job:  [id number]

## 2010-10-01 NOTE — Assessment & Plan Note (Signed)
[facility]   NAMEGIRTHA, [name]                 ACCOUNT NO.:  [id number]   MEDICAL RECORD NO.:  [id number]      DATE OF BIRTH:  03-Oct-1934   PHYSICIAN:  Theresia Majors. Tanda Rockers, M.D. VISIT DATE:  09/13/2007                                   OFFICE VISIT   SUBJECTIVE:  [name] is a 76 year old lady who we are following for a  Wagner 3 diabetic foot ulcer involving the right fifth toe.  In the  interim, we have treated her with a iodoform, Nu Gauze packing, and  irrigations.  She continues to be ambulatory.  She continues on  Augmentin coverage.  There has been no interim fever.  Her sugars have  been well-controlled.  There has been no pain or excessive swelling.   OBJECTIVE:  Blood pressure is 173/67, respirations 18, pulse rate is 68,  temperature is 98.2, capillary blood glucose of 100.  Inspection of the right foot shows mild swelling with a palpable pulse.  The wound at the distal interphalangeal joint of the fifth digit extends  down to and into the joint.  There is no increased hyperemia, drainage,  or malodor.  There is no evidence of ascending cellulitis or abscess.  The off loading sandal is adequate.   ASSESSMENT:  Wagner 3 diabetic foot ulcer.   PLAN:  We will continue offloading with a healing sandal.  We will  switch to a plain Nu Gauze packing quarter inch preceded by copious  irrigation of saline.  We will continue Augmentin and reevaluate the  patient on Wednesday, September 15, 2007. This patient will likely require  adjunctive HBO for closure.      Harold A. Tanda Rockers, M.D.  Electronically Signed     HAN/MEDQ  D:  09/13/2007  T:  09/14/2007  Job:  [id number]

## 2010-10-01 NOTE — Assessment & Plan Note (Signed)
[facility]   [name], [name]                 ACCOUNT NO.:  [id number]   MEDICAL RECORD NO.:  [id number]      DATE OF BIRTH:  02-14-1935   PHYSICIAN:  Jonelle Sports. Sevier, M.D.  VISIT DATE:  12/13/2008                                   OFFICE VISIT   HISTORY:  This is a 76 year old black female who has been a patient  intermittently in this clinic with diabetic foot ulcers and who has had  two prior toe amputations and also history of HBO therapy.  He is being  seen now for diabetic ulcer on the tip of the right second toe and  another on the plantar aspect of the left foot at the area of the fourth  and fifth metatarsal heads.   Recent MRI has shown that she has evidence for osteomyelitis in the  fourth metatarsal head and also in the proximal phalanx of both the  fourth and the fifth toe, all of this on the left.  She reports  considerable pain in the left foot.  The right second toe was not  symptomatic.  She had no fever, chills, or systemic symptoms.  She had  noted some recent swelling of the left lower extremity distal to the  knee.   PHYSICAL EXAMINATION:  Blood pressure 171/81, pulse 59, respirations 21,  temperature 98.3, blood glucose 109 mg/dL.  The ulcer on the tip of the  right second toe measures 0.2 x 0.3 cm, appears shallow and non-  problematic.  There is some surrounding callus.   The plantar ulcer on the left measures 1.9 x 1.0 cm and has a depth of  up to 0.3 cm at its more proximal margin.  It shows some drainage, but  is not malodorous.  There is no surrounding erythema, but the extremity  is indeed involve soft tissue edema both in the foot and in the calf up  almost to the knee.   IMPRESSION:  Satisfactory course, diabetic foot ulcer, tip of the right  second toe; known osteomyelitis in association with open ulceration,  left plantar fourth and fifth metatarsal head areas.   DISPOSITION:  1. The wound on the right foot is  selectively debrided of the callus      surrounding the wound and it will be dressed simply with Neosporin      and protective dressing.  2. The wound on the left plantar surface is debrided with selective      removal of some callus around the wound and some slough within the      wound bed itself.  Both of these procedures are well tolerated.   The left wound will then be treated with an application of silver  alginate and a bulky wrap.  Ultralente 25 will be applied to the  surrounding dry skin.   Measures will be undertaken to get her return to hyperbaric oxygen  therapy as quickly as possible.   She is today begun on doxycycline 100 mg b.i.d.   Followup visit here will be in 1 week.           ______________________________  Jonelle Sports. Cheryll Cockayne, M.D.     RES/MEDQ  D:  12/13/2008  T:  12/14/2008  Job:  [id number] 

## 2010-10-01 NOTE — Assessment & Plan Note (Signed)
[facility]   NAME:  [name], [name]                 ACCOUNT NO.:  [id number]   MEDICAL RECORD NO.:  [id number]      DATE OF BIRTH:  [birth year]   PHYSICIAN:  Theresia Majors. Tanda Rockers, M.D. VISIT DATE:  10/05/2007                                   OFFICE VISIT   SUBJECTIVE:  Ms. Saliba is a 76 year old lady who we were treating with  hyperbaric oxygen for Wagner 3 diabetic foot ulcer, involving the right  distal interphalangeal joint of the fifth digit.  During her last  treatment, she complained of some pressure in her left ear.  She has  also complained of a runny nose and nonproductive cough over the last  several days.  She has had no fever.   OBJECTIVE:  VITAL SIGNS:  Blood pressure is 149/87, respirations 18,  pulse rate 55, and temperature is 97.6.  HEENT:  Remarkable for bilateral occluded external auditory canals due  to build up of wax.  The tympanic membranes cannot be visualized.  There  is mild hyperemia in the nasal ala mucosa.  NECK:  Supple.  There is no adenopathy.  LUNGS:  Clear.  HEART:  The heart sounds are normal.  Inspection of the wound shows that there has been an accumulation of old  clotted blood in the depths of the wound.  There is an extension onto  the proximal phalanx of the previously localized area, at the distal  interphalangeal joint level.  There is no hyperemia.  There is no local  warmth.  The pedal pulse remains palpable.  There is no evidence of  ascending infection or increased edema.  The patient continues in a  darker wedge.   ASSESSMENT:  1. Questionable barotrauma in left ear.  2. Increase maceration of the wound, most likely related to inadequate      off-loading, utilizing the healing sandal.   PLAN:  We will continue her HBO treatment in making sure that she  receives all ordered decongestants.  She will be compressed slowly and  if there is any difficulty whatsoever, we will discontinue her HBO  pending evaluation by  ENT.  We have scheduled her for an ENT consult for  cleaning of the ear and exam.   With regard to her wound care, the patient will continue on her  Augmentin 875/125 mg.  We will continue daily irrigations and packings  with a plain quarter-inch Nu Gauze.  We will change from a darko wedge  to a flat dark heel and sandal.  We have explained this approach to the  patient in turns she seem to understand.  We have alerted the hyperbaric  tech of the specific concerns and advise modification of the  compression.      Harold A. Tanda Rockers, M.D.  Electronically Signed     HAN/MEDQ  D:  10/05/2007  T:  10/06/2007  Job:  [id number]

## 2010-10-01 NOTE — Assessment & Plan Note (Signed)
[facility]   NAMEVERNIS, CABACUNGAN                 ACCOUNT NO.:  [id number]   MEDICAL RECORD NO.:  [id number]      DATE OF BIRTH:  1934-06-20   PHYSICIAN:  Maxwell Caul, M.D. VISIT DATE:  11/27/2006                                   OFFICE VISIT   Mrs. Stout was seen today in conjunction with HBO.  We are following her  for a Wagner's grade 3 right diabetic foot ulcer.  I believe she is up  to dive #15 out of 30 dives.  She had a wound assessment today.  In the  interim she denies excessive drainage or fever.   EXAMINATION:  The wound is well granulated and edema as well controlled.  There is no evidence of infection.  There is some advancing epithelium  here.  There is no malodor or drainage.   IMPRESSION:  I believe the wound is clinically improving.   We will continue with hyperbarics and reevaluate her next week in  conjunction with this.  She will continue her Iodoflex dressing with  SofSorb and extra patting.  I did do a partial thickness debridement of  large redundant callus.  We used #10 blade for this.  There was no need  for anesthesia or hemostasis.           ______________________________  Maxwell Caul, M.D.     MGR/MEDQ  D:  11/27/2006  T:  11/28/2006  Job:  [id number]

## 2010-10-01 NOTE — Assessment & Plan Note (Signed)
[facility]   NAME:  [name], SORCE NO.:  [id number]   MEDICAL RECORD NO.:  [id number]      DATE OF BIRTH:  27-May-1934   PHYSICIAN:  Maxwell Caul, M.D.      VISIT DATE:                                   OFFICE VISIT   PURPOSE OF VISIT:  [name] was seen today in follow up post HBO.  The  last time she was here we placed Actisorb Silver and replaced her in a  total-contact cast.  There was a considerable degree of maceration  around the wound last visit; therefore, we had changed her to a more  absorbent silver-based dressing.   EXAMINATION:  On examination, the wound dimensions are 1.5 x 2.5 x 0.3.  Also of concern, there was a considerable degree of macerated skin  around this wound which was suggestive of, perhaps, friction or moisture  underneath the cast.  Also the base of the wound had a thick, adherent,  unhealthy-looking eschar.  The wound, itself, underwent a full-thickness  debridement.  We used EMLA for anesthesia and a #15 blade.  Hemostasis  was with direct pressure and a silver nitrate stick in two areas.  I  also debrided a large amount of macerated skin from around the wound.  This was done without anesthesia.  After this extensive debridement, the  wound actually looks granulated.  There is some epithelialization at the  base surrounding the wound.  There is no evidence of infection here.   WOUND CARE PLAN AND FOLLOW UP:  After considerable discussion with the  patient and the staff, we have decided to place Versiva, which is an  absorptive dressing to deal with the moderate to severe amount of  drainage coming out of this wound.  I have also elected not to replace  the total-contact cast.  I have put her back in a CAM walker, emphasized  that she needs to spend as little amount of time on this as possible.  The concern here was the considerable amount of macerated skin which  extended almost into over her third  metatarsal head.  We are hoping to  keep the surrounding skin clean and dry.  We will follow up on this next  week.  She is to continue with her HBO.  As mentioned, there was no  evidence of infection here and debridement was listed as above.           ______________________________  Maxwell Caul, M.D.     MGR/MEDQ  D:  10/23/2006  T:  10/23/2006  Job:  [id number]

## 2010-10-01 NOTE — Assessment & Plan Note (Signed)
[facility]   NAME:  MONTE, BRONDER                 ACCOUNT NO.:  [id number]   MEDICAL RECORD NO.:  [id number]      DATE OF BIRTH:  02-04-1935   PHYSICIAN:  Theresia Majors. Tanda Rockers, M.D. VISIT DATE:  09/17/2007                                   OFFICE VISIT   SUBJECTIVE:  Ms. Escutia is a 76 year old female who we are following for  a Wagner III diabetic foot ulcer involving the proximal interphalangeal  joint of the fifth digit of the right foot.  In the interim, we have  treated her with a plain new gauze ribbon packing and saline  irrigations.  She continues on Augmentin.  She is complaining of some  pain under the great toe of the right foot.  There has been no fever and  no redness or drainage.   OBJECTIVE:  Her vital signs are stable.  She is afebrile.  Inspection of  her right lower extremity shows no hyperemia.  There is a miliary rash  at the gaiter area but this is nontender.  There are no lymphangitic  streaks.  The capillary refill is brisk.  There is no evidence of  concurrent ischemia.  Wound #4 remains clean, the packing was removed,  the wound was sounded with a Q-tip and penetrates similarly into the  joint from the previous exam.  There is decreased drainage and  absolutely no malodor.  The area of pain on the volar aspect of the foot  is associated with a thick callus.  This callus was elevated with pair  of forceps and this disclosed some liquefaction.  The callus was sharply  excised disclosing a frank ulceration.  We continued to debride the area  of nonviable skin in the periphery.  This was cornified layer of  epithelium extending down into the subcutaneous level.  This callus  easily peeled out disclosing areas of new epithelium advancing towards  the central portion of the ulcer.  Both these wounds were photographed  and cataloged as well as measured, please refer to the data entries.  Thereafter, the wound was cleansed and topical Iodosorb  applied.   ASSESSMENT:  [name] III diabetic foot ulcer involving the fifth digit,  right foot and a Wagner I ulcer involving the volar aspect of the first  metatarsal head.   PLAN:  We will proceed with HBO for the Wagner III ulcer.  We will  continue local care with topical Iodosorb gel and a Darco wedge  offloading heel and sandal for the foot.  We will reevaluate the patient  on Monday, Sep 20, 2007.      Harold A. Tanda Rockers, M.D.  Electronically Signed     HAN/MEDQ  D:  09/17/2007  T:  09/18/2007  Job:  [id number]

## 2010-10-01 NOTE — Op Note (Signed)
[name], [name]                 ACCOUNT NO.:  [id number]   MEDICAL RECORD NO.:  [id number]          PATIENT TYPE:  AMB   LOCATION:  [facility]                          FACILITY:  [facility]   PHYSICIAN:  Vanita Panda. Magnus Ivan, M.D.DATE OF BIRTH:  12-30-1934   DATE OF PROCEDURE:  06/23/2008  DATE OF DISCHARGE:                               OPERATIVE REPORT   PREOPERATIVE DIAGNOSIS:  Right foot third toe chronic wound and likely  osteomyelitis.   POSTOPERATIVE DIAGNOSIS:  Right foot third toe chronic wound and likely  osteomyelitis.   PROCEDURE:  Right foot third toe amputation.   SURGEON:  Vanita Panda. Magnus Ivan, MD   ANESTHESIA:  1. Right local ankle block.  2. Mass ventilation IV sedation.   ESTIMATED BLOOD LOSS:  Minimal.   COMPLICATIONS:  None.   INDICATIONS:  Briefly, Ms. [name] is a 76 year old diabetic female with a  chronic wound on the plantar surface at the tip of her right foot third  toe.  The wound was definitely down to bone with exposed bone and  radiographic evidence of osteomyelitis at the tip.  I recommended she  undergo amputation of this toe given the exposed nature of the bone as  well as her diabetes and a foul odor that was coming from this.  Risks  and benefits of surgery were explained to her and her family at length  and they agreed to proceed with surgery.   DESCRIPTION OF PROCEDURE:  After informed consent was obtained, the  appropriate right ankle was marked.  Anesthesia obtained a local right  ankle block.  She was then brought to the operating room and placed  supine on the operating table.  Her right foot was prepped and draped  with DuraPrep and sterile drapes.  I used a towel around the ankle and  an Esmarch to wrap out the foot and the ankle.  A time-out was called  and she was identified as the correct patient and correct right foot.  I  then used a #15 blade and made an elliptical incision around the toe.  I  was able to remove the toe with  bone cutting forceps and rongeur.  The  toe was almost entirely back close to the metatarsal.  The tip of the  toe was the only area that looked necrotic.  The remainder of the tissue  around here looked nice and clean and viable.  It had good blood flow as  well.  I then thoroughly irrigated the wound and closed the wound in its  entirety with interrupted 3-0 nylon suture.  I removed the ankle Esmarch  as well and placed  Xeroform followed by well-padded sterile dressing around the foot.  She  was then taken to the recovery room in stable condition.  All final  counts were correct and there were no complications noted.  Postoperatively, I will allow her to weightbear as tolerated in a postop  shoe with followup in the office in 3 days for a dressing change.      Vanita Panda. Magnus Ivan, M.D.  Electronically Signed  CYB/MEDQ  D:  06/23/2008  T:  06/23/2008  Job:  [id number]

## 2010-10-01 NOTE — Assessment & Plan Note (Signed)
[facility]   NAME:  [name], [name]                 ACCOUNT NO.:  [id number]   MEDICAL RECORD NO.:  [id number]      DATE OF BIRTH:  09-03-1934   PHYSICIAN:  Theresia Majors. Tanda Rockers, M.D.      VISIT DATE:                                   OFFICE VISIT   CARE MANAGEMENT OUTLIER ASSESSMENT /CMOT   SUBJECTIVE:  [name] is a 76 year old lady who we have been treating  for a Wagner grade 3 diabetic foot ulcer with multiple modalities  including serial debridements, hyperbaric oxygen and offloading.  Her  initial visit was 08/25/2006.  She has been evaluated by the vascular  surgery service who has recommended that we pursue aggressively her  wound care management as she has marginal revascularization potential.  Her TCO2 suggests that she has had an adequate collateralization flow to  achieve healing.  Her in chamber TCO2 were confirmatory.  The  debridements of nonviable tissue have been satisfactory.  There has been  no evidence of ascending infection.  She has been treated with one  course of Keflex for a staph colonization.  Edema has been well  controlled and the periwound environment is conducive for healing.  On  last exam, there is evidence of granulation with advancing epithelium.  The offloading has been compromised by the removal of the total contact  cast due to increased drainage.  Pain has been well controlled.  There  are no host factors such as uncontrolled diabetes, cardiac disease are  psychosocial issues that need attendance.   ASSESSMENT:  Slowly progressive wound most likely related to inadequate  offloading and compromised environment due to exudates.   PLAN:  We will return the patient to a total contact cast.  We will add  a silver alginate dressing.  We will reevaluate the patient more  frequently to ensure no maceration or secondary overwhelming  colonization.      Harold A. Tanda Rockers, M.D.  Electronically Signed     HAN/MEDQ  D:   11/10/2006  T:  11/10/2006  Job:  [id number]

## 2010-10-01 NOTE — Assessment & Plan Note (Signed)
[facility]   NAMEREVE, CROCKET                 ACCOUNT NO.:  [id number]   MEDICAL RECORD NO.:  [id number]      DATE OF BIRTH:  February 04, 1935   PHYSICIAN:  Jonelle Sports. Sevier, M.D.  VISIT DATE:  12/25/2006                                   OFFICE VISIT   REASON FOR VISIT:  This 76 year old, black female is evaluated upon  completion now of 60 hyperbaric oxygen therapy treatments for a Wagner  grade 3 ulceration in the plantar aspect of the first metatarsal area of  the right foot.   She had recently developed a blister on the medial tip of the left  hallux as well which seems to have largely resolved, but also a rubbed  area with ulceration on the lateral aspect of the left fifth toe.  This  was treated most recently with antibiotic ointment and soft wrap and a  healing sandal.   She has been in a total contact cast on the right lower extremity.   PHYSICAL EXAMINATION:  VITAL SIGNS:  Vital signs are recorded in the  hyperbaric oxygen therapy notes from today and are not repeated here.  EXTREMITIES:  The wound at the plantar aspect of the first metatarsal  head appears completely healed, but the skin is extremely thin and  fragile in this area.  The blistered area on the tip of the left hallux  shows some hemorrhagic callus formation in what was the base of the  blister.  On the lateral aspect of the left fifth toe is an open ulcer  measuring 0.5 x 0.3 x 0.2-cm with a fibrinous exudate in the base which  is probably right on the extensor tendon.   IMPRESSION:  Satisfactory healing of plantar ulcer, right foot, in  association with hyperbaric oxygen therapy.   DISPOSITION:  1. It is felt the patient can discontinue hyperbaric oxygen therapy      for the present.  2. Because of the fragility of the skin in the area of that ulcer, it      is recommended that she will need to continue in total contact      casting for a period of several additional weeks to allow  that area      to toughen before she returns to more traditional diabetic      footwear.  3. Accordingly, that extremity is placed again in a total contact      cast.  4. The hemorrhagic callus on the medial tip of the right hallux is      sharply debrided without incident and there is no open ulceration      there.  5. The ulcer on the lateral aspect of the left fifth toe is sharply      debrided at its margins.  It will be treated with an application of      Bactroban ointment and covered with a soft dry dressing.  She will      remain in a healing sandal on that shoe.  6. She is instructed to change the dressing on that toe daily,      cleansing the wound gently with warm soapy water,      rinsing it well and then reapplying the Bactroban  ointment for      which she is given a prescription.  The area than is to be covered      with a dry dressing as we have done.  7. Followup visit will be here in 10 days.           ______________________________  Jonelle Sports Cheryll Cockayne, M.D.     RES/MEDQ  D:  12/25/2006  T:  12/25/2006  Job:  [id number]

## 2010-10-01 NOTE — Assessment & Plan Note (Signed)
[facility]   NAMEHOLLEIGH, [name]                 ACCOUNT NO.:  [id number]   MEDICAL RECORD NO.:  [id number]      DATE OF BIRTH:  1934/12/22   PHYSICIAN:  Theresia Majors. Tanda Rockers, M.D. VISIT DATE:  12/07/2006                                   OFFICE VISIT   SUBJECTIVE:  [name] is a 76 year old lady who is undergoing  hyperbaric oxygen treatment for Wagner grade 3 diabetic foot ulcer  involving the right foot.  She has also had a recent Wagner grade 2  wound discerned on the fifth metatarsal head and was started on a Darco  shoe as offloading.  Over the weekend she has rubbed a blister on the  left hallux and second toe.  She is seen for evaluation.  She has  completed her HBO dive today without difficulty.   OBJECTIVE:  VITAL SIGNS:  Stable.  She is afebrile.  EXTREMITIES:  Inspection of the foot shows that there is a second-degree  blister with discolored fluid on the hallux.  We did not disrupt this  wound.  There is no evidence of ascending infection.  The patient  remains insensate to the [facility] filament. The pedal pulses are  indeterminate and consistent with a previous level of vascular  impairment.   ASSESSMENT:  Inadequate offloading with injury due to the mal fitting  sandal.   PLAN:  We have modified the healing sandal to include a flat healing  sole.  We will apply a thick cotton dressing and we will inspect this  wound daily.  Once there is evidence of spontaneous rupture we will  debride the remainder of the blister.  We have explained these  instructions and the modification of her offloading sandal to the  patient in terms that she seems to understand.  We have given an  opportunity to ask questions.  She expresses gratitude for the interim  evaluation.      Harold A. Tanda Rockers, M.D.  Electronically Signed     HAN/MEDQ  D:  12/07/2006  T:  12/07/2006  Job:  [id number]

## 2010-10-01 NOTE — Assessment & Plan Note (Signed)
[facility]   [name], [name]                 ACCOUNT NO.:  [id number]   MEDICAL RECORD NO.:  [id number]      DATE OF BIRTH:  31-Aug-1934   PHYSICIAN:  Leonie Man, M.D.    VISIT DATE:  01/08/2009                                   OFFICE VISIT   PROBLEM:  Diabetic foot ulcers bilaterally in this 76 year old lady on  the right foot.  It is at the distal tip on the plantar surface of the  second toe, however, the current measurements of 0.4 x 0.3 x 0.1.  On  the left lower extremity, it is on the plantar surface of her foot and  located more laterally than expected.  This measures 1.3 x 0.7 x 0.1 cm.  The patient says this has been followed in the past with silver alginate  and Neosporin dressings changed every 3 days.  She is here for  reevaluation today.   PHYSICAL EXAMINATION:  VITAL SIGNS:  Temperature 98.1, pulse 50,  respirations 18, and blood pressure 156/73.  The ulcer on the base of the second toe of the right foot is clean and  granulating with out odor or drainage..   TREATMENT PLAN:  We will treat this with hydrogel and wrapping in a toe  sock.  On the left foot, the wound is healing quite satisfactorily.  The  base is clean and the surrounding areas have significant callus  formation.  Most of the callus of this was shaved off today.  We will  continue her on silver alginate and a wrap for her foot.  I will  discontinue the neomycin for now.      Leonie Man, M.D.  Electronically Signed     PB/MEDQ  D:  01/08/2009  T:  01/09/2009  Job:  [id number]

## 2010-10-01 NOTE — Assessment & Plan Note (Signed)
[facility]   NAME:  ANALEA, MULLER NO.:  [id number]   MEDICAL RECORD NO.:  [id number]      DATE OF BIRTH:  04/19/1935   PHYSICIAN:  Maxwell Caul, M.D. VISIT DATE:  12/18/2006                                   OFFICE VISIT   LOCATION:  [facility].   Mrs. Jordan is seen today post dive for her Wegener's grade 3 diabetic  foot ulcer, involving her right foot.  She had also developed a blister  on her left first toe, which is resolved.  She also has an open area on  the lateral aspect of her left fifth toe.   EXAMINATION:  SKIN:  On examination, the wound on her right first  metatarsal head actually looks considerably improved.  There is healthy  granulation tissue in this wound and progressive epithelialization.  The  improvement here is really quite gratifying.   She did not have any wound on her left first toe.  However, the wound on  her left fifth toe had a necrotic base of this.  This underwent a full-  thickness debridement with a #15-blade.  We used EMLA for anesthesia.  Hemostasis was with direct pressure.   IMPRESSION:  Problem #1.  Improving wound on the right foot.  I have  changed her from Iodosorb to Promogran and hydrogel.  We have continued  with SofSorb and a total contact cast.  She will continue with her  hyperbaric oxygen treatment through next week.   Problem #2.  Probably largely pressure wound on her left fifth toe.  I  have debrided this as described above.  We have applied antibiotic  ointment, Kerlix, and she will continue in the healing sandal.   There are pressure areas on the top of her right first toe, which the  ortho tech will pad.  She did not have a wound on her left first toe  today.           ______________________________  Maxwell Caul, M.D.     MGR/MEDQ  D:  12/18/2006  T:  12/18/2006  Job:  [id number]

## 2010-10-01 NOTE — Assessment & Plan Note (Signed)
[facility]   NAME:  BLONNIE, MASKE NO.:  [id number]   MEDICAL RECORD NO.:  [id number]      DATE OF BIRTH:  1934/08/24   PHYSICIAN:  Maxwell Caul, M.D. VISIT DATE:  10/23/2006                                   OFFICE VISIT   LOCATION:  [facility]   HISTORY:  The purpose for today's visit:  The patient was seen in  conjunction with her HBO which she tolerated without difficulty.  She is  being treated for a plantar ulcer at the base of her right first  metatarsal head.   The wound   DICTATION ENDED AT THIS POINT.           ______________________________  Maxwell Caul, M.D.     MGR/MEDQ  D:  10/23/2006  T:  10/23/2006  Job:  [id number]

## 2010-10-01 NOTE — Assessment & Plan Note (Signed)
[facility]   NAME:  [name], [name]                 ACCOUNT NO.:  [id number]   MEDICAL RECORD NO.:  [id number]      DATE OF BIRTH:  [birth year]   PHYSICIAN:  Theresia Majors. Tanda Rockers, M.D. VISIT DATE:  10/19/2007                                   OFFICE VISIT   SUBJECTIVE:  [name] is a 76 year old female who we have treated for  Wagner III diabetic foot ulcer involving the fifth distal  interphalangeal joint of the right foot.  In the interim, we have  protected her foot with an offloading sandal.  She has completed a  course of p.o. antibiotics as well as 12 hyperbaric oxygen treatment.  She returns for followup.  There has been no interim fever, redness,  swelling, or pain.   OBJECTIVE:  Blood pressure is 118/72, respirations 18, pulse rate 78,  temperature is 98, and capillary blood glucose is 193 mg%.  Inspection  of the right foot shows that there is a minuscule area of residual  wound, measuring less than 1 mm in diameter and non-penetrable nontender  with a Q-tip.  There is no hyperemia.  There is no evidence of  infection.  Capillary refill is brisk.  There is no evidence of  ischemia.   ASSESSMENT:  Improvement of Wagner III wound.   PLAN:  We are discontinuing the hyperbaric oxygen treatment.  The  patient has completed a course of doxycycline and Septra.  We will  continue offloading and reevaluate the patient in 1 week to assess her  response to therapy.   We have explained this assessment to the patient in terms of that she  seems to understand.  We instructed her to avoid an injury to her feet,  to inspect her feet daily, and to continue the routine diabetic foot  care.  She expresses gratitude for having been seen in the clinic and  indicates that she will be compliant.  We will reevaluate her in 1 week.      Harold A. Tanda Rockers, M.D.  Electronically Signed     HAN/MEDQ  D:  10/19/2007  T:  10/20/2007  Job:  [id number]

## 2010-10-01 NOTE — Assessment & Plan Note (Signed)
[facility]   NAMEJHORDYN, [name]                 ACCOUNT NO.:  [id number]   MEDICAL RECORD NO.:  [id number]      DATE OF BIRTH:  [birth year]   PHYSICIAN:  Theresia Majors. Tanda Rockers, M.D.      VISIT DATE:                                   OFFICE VISIT   SUBJECTIVE:  [name] is a 76 year old lady who has completed 60 HBO  treatments for a Wagner grade 3 diabetic foot ulcer involving the right  plantar foot.  She also has a wound #2 on the left fifth toe.  In the  interim, she has been treated with a total contact cast and mupirocin  ointment for the wound #2.  There has been no excessive drainage,  malodor, pain or fever.  She continues to be ambulatory.  Her sugars are  under reasonable control.   OBJECTIVE:  Blood pressure is 1367/78, respirations 18, pulse rate 61,  temperature 97.1.  Capillary blood glucose is 140 mg percent.   Inspection of the lower extremity shows that the wound #1 on the right  plantar surface has contracted significantly; the wound now is punctate  in nature with a gelatinous exudate which was easily mechanically  debrided.  Areas of hypercornification were sharply excised.  Wound #2  on the left fifth toe is covered by dry eschar with no drainage.  There  is no evidence of critical ischemia.  There is no evidence of ascending  infection, lymphangitis or abscess.   ASSESSMENT:  1. Clinical improvement of wound #1 with offloading.  2. No evidence of progression of wound #2.   PLAN:  We will continue total contact casting and the use of topical  mupirocin ointment.  We will add an Aquacel silver to the total contact  cast and evaluate her in two weeks.      Harold A. Tanda Rockers, M.D.  Electronically Signed     HAN/MEDQ  D:  01/05/2007  T:  01/05/2007  Job:  [id number]

## 2010-10-01 NOTE — Assessment & Plan Note (Signed)
[facility]   NAME:  [name], [name]                 ACCOUNT NO.:  [id number]   MEDICAL RECORD NO.:  [id number]          DATE OF BIRTH:   PHYSICIAN:  Theresia Majors. Tanda Mosley, M.D. VISIT DATE:  10/02/2006                                   OFFICE VISIT   VITAL SIGNS:  Blood pressure is 145/63, respirations are 20, pulse rate  81, capillary blood glucose is 266 mg percent.   SUBJECTIVE:  [name] is a 76 year old lady who is undergoing  hyperbaric oxygen treatment for a Wegner grade 3 diabetic foot ulcer.  She has currently completed 11 of 30 dives.  She returns for wound  assessment.   WOUND SINCE LAST VISIT:  In the interim, she has complained of some  malodor but no pain.  She has also been seen by her ophthalmologist and  has been advised that she has cataracts and will ultimately require  surgery.  She reports that she has some slight changes in her vision at  distances.  She has had no fever however.   WOUND EXAMINATION:  The HEENT examination is clear.  The external  auditory canals are impacted with wax, but there are no complaints of  pressure.  The nasopharynx is clear.  Inspection of the foot discloses  some maceration related to a buildup of moisture.  These areas were  debrided.  The wound itself has islands of necrosis which were sharply  excised with a rongeur with hemorrhage controlled direct pressure.  Areas of desquamation were sharply paired with a #10 blade without  incident.   ASSESSMENT:  Overall, there has been a clinical response to HBO with  contraction of the wound and complete coverage of the metatarsal  phalangeal joint with what appears to be healthy granulation tissue.   PLAN:  We will continue hyperbaric oxygen treatment with total contact  casting and a Silver matrix dressing.   With regard to her changes in vision, we have advised the patient that  we anticipate changes of myopia during the course of her HBO treatment.  This should  be self-limited and should be protected within six to eight  weeks following her hyperbaric oxygen course.  We have advised her not  to have changes made to her current eyeglasses within the period of  treatment in six to eight weeks beyond.  We will continue HBO and  reevaluate her wound in one week.      [name], M.D.  Electronically Signed     HAN/MEDQ  D:  10/02/2006  T:  10/02/2006  Job:  [id number]   cc:   [name], M.D.

## 2010-10-01 NOTE — Assessment & Plan Note (Signed)
[facility]   NAME:  [name], [name]                 ACCOUNT NO.:  [id number]   MEDICAL RECORD NO.:  [id number]      DATE OF BIRTH:  07-16-1934   PHYSICIAN:  Theresia Majors. Tanda Rockers, M.D. VISIT DATE:  09/15/2007                                   OFFICE VISIT   SUBJECTIVE:  [name] is a 76 year old lady who we followed for Wagner  III diabetic foot ulcer involving the right fifth digit at the proximal  interphalangeal joint.  She has been treated with irrigation and a loose  packing with a quarter-inch Iodoform Nu-gauze.  She continues offloading  with a healing sandal.  There has been no excessive drainage, malodor,  pain, or fever.  She continues on Augmentin 850 mg p.o. b.i.d.   OBJECTIVE:  Blood pressure is 179/71, respirations 16, pulse rate 48,  temperature 97.  Capillary blood glucose is 98 mg percent.  Inspection  of the lower extremity shows a mild hyperemia with a tapped-appearing  hyperemia at the gaiter area.  There is associated 1-2+ edema  bilaterally.  The feet are warm with palpable pulses.  On the right  fifth digit, wound #4 is clean.  It sounds completely to and into the  proximal interphalangeal joint.  There is secondary bleeding which is  easily controlled with direct pressure.  The wound appears to be clean,  however.  There is medial subdermal hemorrhage of the fifth digit with  newly formed callus on the distal tip of the fifth digit of the right  foot.  There is no ascending cellulitis, lymphangitis, or abscess.   CLINICAL IMPRESSION:  [name] III diabetic foot ulcer with adequate  drainage.   PLAN:  We are recommending that we proceed with hyperbaric oxygen  treatment to be administered at 100% oxygen at 2.4 atmospheres with two  5-minute air breaks, a total of 30 treatments initially with re-  evaluations after each five treatments.   We have explained the recommendation for a hyperbaric oxygen treatment  in the management of a Wagner III  ulcer to the patient and terms that  she seems to understand.  We specifically discussed the potential  complications of oxygen toxicity, barotrauma, and claustrophobia.  The  patient has undergone HBO treatment in the past and indicates that she  was familiar with this discussion.  We gave her an opportunity to ask  questions.  She expresses gratitude for having been seen in the clinic  and wishes to proceed with hyperbaric oxygen treatment as outlined.      Harold A. Tanda Rockers, M.D.  Electronically Signed     HAN/MEDQ  D:  09/15/2007  T:  09/15/2007  Job:  [id number]

## 2010-10-01 NOTE — Assessment & Plan Note (Signed)
[facility]   [name], [name] NO.:  [id number]   MEDICAL RECORD NO.:  [id number]      DATE OF BIRTH:  [birth year]   PHYSICIAN:  Maxwell Caul, M.D. VISIT DATE:  10/30/2006                                   OFFICE VISIT   PURPOSE OF TODAY'S VISIT:  Ms. Stcharles today was seen for wound eval  status post HBO.  She tolerated the dye without issue.  Last week, we  had taken her out of a total contact cast and applied an absorbed  dressing Versiva.  She states she continues to be having moderate amount  of drainage, which she described as malodorous.  She is also concerned  about a small black area over her 3rd metatarsal head.   WOUND EXAM:  Current dimensions of the wound are 1.5 x 2.5 x 0.4,  essentially unchanged from last time.  The degree of macerated skin  around the wound is considerably better in a Cam Walker.  The wound  itself underwent a full-thickness debridement.  There was no anesthesia  necessary.  Hemostasis was at direct pressure.  We used a #15-blade.  We  also debrided a small amount of callous from around the wound.  The  wound bed itself appears granulated.  There continues to be some  epithelization present; however, this does not cover the wound bed  itself.  There is no evidence of active infection.   Lateral to this wound was a small blackened area that underwent a  partial-thickness debridement with a #15-blade.  No anesthesia was  necessary.  No hemostasis was necessary.  After this, most of what was  underneath this was simply skin; however, there is a small area that  will need to have serial followups.   WOUND CARE PLAN AND FOLLOWUP:  We have continued with the Vesicare to  the wound and her Cam Walker.  We will see her next week in conjunction  with HBO.  This is originally a Wagner's grade 3 wound.  It is gradually  improving.  I found myself wondering about other treatment options here;  however, we will  continue to observe her with the current treatment for  now.           ______________________________  Maxwell Caul, M.D.     MGR/MEDQ  D:  10/30/2006  T:  10/30/2006  Job:  [id number]

## 2010-10-02 ENCOUNTER — Other Ambulatory Visit: Payer: Self-pay | Admitting: *Deleted

## 2010-10-03 ENCOUNTER — Other Ambulatory Visit (HOSPITAL_COMMUNITY): Payer: Self-pay | Admitting: Oncology

## 2010-10-03 ENCOUNTER — Encounter (HOSPITAL_BASED_OUTPATIENT_CLINIC_OR_DEPARTMENT_OTHER): Payer: PRIVATE HEALTH INSURANCE | Admitting: Oncology

## 2010-10-03 DIAGNOSIS — D472 Monoclonal gammopathy: Secondary | ICD-10-CM

## 2010-10-03 DIAGNOSIS — D649 Anemia, unspecified: Secondary | ICD-10-CM

## 2010-10-03 DIAGNOSIS — N289 Disorder of kidney and ureter, unspecified: Secondary | ICD-10-CM

## 2010-10-03 DIAGNOSIS — N189 Chronic kidney disease, unspecified: Secondary | ICD-10-CM

## 2010-10-03 DIAGNOSIS — D510 Vitamin B12 deficiency anemia due to intrinsic factor deficiency: Secondary | ICD-10-CM

## 2010-10-03 DIAGNOSIS — D631 Anemia in chronic kidney disease: Secondary | ICD-10-CM

## 2010-10-03 LAB — CBC WITH DIFFERENTIAL/PLATELET
BASO%: 0.4 % (ref 0.0–2.0)
Basophils Absolute: 0 10*3/uL (ref 0.0–0.1)
Eosinophils Absolute: 0.2 10*3/uL (ref 0.0–0.5)
HCT: 36.3 % (ref 34.8–46.6)
HGB: 11.7 g/dL (ref 11.6–15.9)
MONO#: 0.8 10*3/uL (ref 0.1–0.9)
NEUT#: 4.6 10*3/uL (ref 1.5–6.5)
NEUT%: 66 % (ref 38.4–76.8)
Platelets: 124 10*3/uL — ABNORMAL LOW (ref 145–400)
WBC: 6.9 10*3/uL (ref 3.9–10.3)
lymph#: 1.4 10*3/uL (ref 0.9–3.3)

## 2010-10-03 LAB — COMPREHENSIVE METABOLIC PANEL
ALT: <8 U/L (ref 0–35)
BUN: 31 mg/dL — ABNORMAL HIGH (ref 6–23)
CO2: 21 mEq/L (ref 19–32)
Calcium: 8.7 mg/dL (ref 8.4–10.5)
Chloride: 105 mEq/L (ref 96–112)
Creatinine, Ser: 1.8 mg/dL — ABNORMAL HIGH (ref 0.40–1.20)
Glucose, Bld: 100 mg/dL — ABNORMAL HIGH (ref 70–99)

## 2010-10-03 LAB — LACTATE DEHYDROGENASE: LDH: 136 U/L (ref 94–250)

## 2010-10-03 MED ORDER — LANCETS 30G MISC: Status: DC

## 2010-10-04 LAB — VITAMIN B12: Vitamin B-12: 306 pg/mL (ref 211–911)

## 2010-10-04 NOTE — Discharge Summary (Signed)
NAMELAELYNN, BLIZZARD                 ACCOUNT NO.:  [id number]   MEDICAL RECORD NO.:  [id number]          PATIENT TYPE:  INP   LOCATION:  6727                         FACILITY:  [facility]   PHYSICIAN:  Ileana Roup, M.D.  DATE OF BIRTH:  03/21/1935   DATE OF ADMISSION:  08/04/2006  DATE OF DISCHARGE:  08/07/2006                               DISCHARGE SUMMARY   DISCHARGE DIAGNOSES:  1. Ecchymotic blister with surrounding cellulitis on the right first      metatarsophalangeal area plantar surface.  Culture results of fluid      aspirated from the area were negative.  2. Diabetes type 2.  Last hemoglobin A1c was 7.5 in December of 2007,      with additional peripheral neuropathy secondary to diabetes.  3. History of atrial fibrillation.  No Coumadin.  4. Iron deficiency anemia and B12 deficiency.  5. Hypothyroidism status post thyroidectomy.  6. Chronic venous insufficiency.  7. Asthma.  8. Hypertension.  9. Chronic renal insufficiency.  10.Recurrent right pleural effusions, status post video-assisted      thoracoscopic surgery and pleurodesis in September of 2006.  11.IgA monoclonal gammopathy of unknown significance.  12.Seasonal allergies.  13.Cataracts.  14.Gastritis and superficial ulcers on esophagogastroduodenoscopy in      August of 2006.  15.Spongiotic psoriasiform dermatitis in September of 2007.  16.Status post focal cellulitis of the left heel in September of 2007      that grew Methicillin-sensitive staph aureus as well as group B      strep.   DISCHARGE MEDICATIONS:  1. Glipizide 10 mg p.o. b.i.d.  2. Januvia 50 mg p.o. daily.  3. Synthroid 100 mcg p.o. daily.  4. Protonix 40 mg one p.o. b.i.d.  5. Lasix 20 mg p.o. daily.  6. Lisinopril 20 mg p.o. daily.  7. Olux-E 0.05% foam as needed for rash.  8. Doxycycline 100 mg one p.o. b.i.d. x14 days.   DISPOSITION AND FOLLOWUP:  Patient is to follow up with Dr. Janee Morn in  the [facility] on April 2nd  at 10:30 a.m.  At that  time, please follow up on the status of the patient's right foot  cellulitis.  The patient was continued on doxycycline for 14 days after  she received a few days of Vancomycin and Zosyn during her admission.  Please check that she has been able to tolerate this medication and has  been obtaining it.  Please also follow up on any diabetes changes or  blood pressure modifications.   PROCEDURES PERFORMED DURING THIS ADMISSION:  1. X-ray of right foot August 04, 2006 showing no acute osseous      abnormalities.  2. Chest x-ray August 04, 2006 showing no acute pulmonary findings.      Chronic-appearing lung changes with the right effusion and right      lung scarring changes.  3. Abdominal x-ray August 05, 2006 showing stool throughout colon,      probable constipation with right pleural effusion versus      thickening.   CONSULTATIONS:  General surgery.   BRIEF ADMITTING HISTORY  AND PHYSICAL:  This is a 76 year old woman who  presents to the Outpatient Clinic with a new soreness and an ecchymotic  blister on her right foot on the plantar surface just under her first  MTP.  The patient first noticed the lesion approximately three days  prior to arrival.  Three days prior to arrival she did also do an  unusual amount of walking at that time.   Patient also noticed this lesion after the patient was feeling pain and  soreness on her right foot.  The pain became progressively worse and  forced the patient to come to the clinic on the day of admission.   There is no fever or chills, no chest pain or shortness of breath, no  nausea, vomiting, diarrhea or constipation.  There are no known drug  allergies.   PHYSICAL EXAMINATION:  VITALS:  Temperature is 97.9, blood pressure  146/66, pulse is 79, respiratory rate 18, sating 100% on room air.  Weight is 89.3 kilograms.  GENERAL:  There is no acute distress.  EYES:  Extraocular muscles intact.  Pupils equally round and  reactive to  light and accommodation.  ENT:  Oropharynx is clear.  No thyromegaly.  Moist mucous membranes.  NECK:  Supple.  No JVD, no carotid bruits.  RESPIRATORY:  Clear to auscultation bilaterally with good breath sounds.  CARDIOVASCULAR:  Regular rate and rhythm with a 2/6 whooshing systolic  ejection murmur heard best at the right second intercostal space.  GI:  Soft, nontender, nondistended, with positive bowel sounds.  EXTREMITIES:  On the patient's right foot is a question of a hematoma in  that area, as there is current blood or dark-colored material at the  surface with a thin dermis on examination of the plantar surface just  under her first MTP.  The lesion is approximately 4 x 4 cm.  There is no  tenderness to palpation , it is not warm to the touch, there is no  obvious drainage, it is quite fluctuant underneath.  There are no other  changes on the right foot versus the left foot.  SKIN:  Otherwise warm and dry.  NEURO:  Cranial nerves II-XII fully intact.  Strength is 5/5 in all four  extremities.  Sensation is intact in all four extremities except for  some sensory deficit in the patient's bilateral feet.  Finger-to-nose is  okay.  Patient can move her toes without any difficulty.  PSYCH:  Appropriate.   INITIAL LABS:  Sodium 137, potassium 4.2, chloride 100, bicarb 30, BUN  30, creatinine 1.4, glucose 175, white count 7.0, hemoglobin 10.6,  platelets 140, MCV 89.5, RDW 12.7, calcium 9.5.   HOSPITAL COURSE:  1. Blister on plantar aspect of right foot with ecchymotic base and      surrounding cellulitis.  The patient was afebrile with a non-      elevated white count.  Patient was treated with empiric IV      antibiotics and local care.  Surgery consult felt that there was no      indication for debridement; they did recommend protecting the area      and off-loading pressure on the area.  Patient rapidly improved;     she was maintained on Vancomycin and Zosyn until  her discharge at      which point she was switched over to doxycycline for a 14-day      course, which would cover possible community-acquired MRSA.   The patient will follow  up April 2 at which time Dr. Janee Morn or other  care Lennard Capek in the Outpatient Clinic should check to see that the  wound is healing appropriately and it does not have any secondary  infection at that time.  If it does, you may wish to obtain cultures via  aspirate as well as blood cultures and consider a change in antibiotics  on this patient.   1. Diabetes type 2.  We maintained the patient on NovoLog sliding      scale and checked a hemoglobin A1c during this admission.   1. Hypothyroidism.  We checked a TSH, maintained the patient on      Synthroid at 75 mcg per day and increased her to her home dose at      discharge.   1. Chronic venous insufficiency.  The patient will be maintained on      Lasix 20 mg p.o. daily.   1. History of atrial fibrillation.  She was normal sinus rhythm on      admission.  We did check an EKG, TSH and the patient was not      receiving any anticoagulation at home; therefore, we did not give      any on this admission.   1. Hypertension.  The patient did have slightly elevated blood      pressure on admission.  We started the patient on lisinopril 10 mg      p.o. daily and increased her to 20 mg later in her stay and on      discharge to better control her blood pressure.   DISCHARGE DAY LABS AND VITALS:  Temperature is 98.3, blood pressure  120/60, pulse in the 60s, respiratory 20, O2 sat 97% on room air.   Last CBG is 161, white count 7.1, hemoglobin 10.8, platelets 152, sodium  135, potassium 3.9, chloride 97, bicarb 28, BUN 34, creatinine 1.9,  glucose 131, lipase 36.   Blood cultures were pending.  Wound cultures showed no growth to date  although final culture was pending as well.      Thereasa Solo, M.D.  Electronically Signed      Ileana Roup, M.D.   Electronically Signed    AS/MEDQ  D:  08/10/2006  T:  08/10/2006  Job:  [id number]

## 2010-10-04 NOTE — H&P (Signed)
[name], [name]                 ACCOUNT NO.:  [id number]   MEDICAL RECORD NO.:  [id number]          PATIENT TYPE:  ORB   LOCATION:  4503                         FACILITY:  [facility]   PHYSICIAN:  Ranelle Oyster, M.D.DATE OF BIRTH:  20-Sep-1934   DATE OF ADMISSION:  05/07/2005  DATE OF DISCHARGE:                                HISTORY & PHYSICAL   CHIEF COMPLAINT:  General weakness.   HISTORY OF PRESENT ILLNESS:  This is a 76 year old African-American female  with a history of diabetes and chronic renal insufficiency who was admitted  with shortness of breath and chest pain on April 14, 2005. She was found  to have a right pleural effusion and ultimately underwent thoracentesis the  same day. The patient continued to develop recurrent pleural effusions and  eventually on December 7 the patient underwent a right-sided VATS with  pleurodesis by Dr. Laneta Simmers. Chest tube was discontinued on December 9. The  patient had hospital complications of hypotension and bradycardia requiring  pressor support. The patient was followed by critical care medicine.  Effusion was persistent as well. Atrial fibrillation without rapid  ventricular response required addition of a beta blocker and Coumadin was  resumed. The patient was transfused 2 units of packed red blood cells for  anemia on December 18. The patient continues with shortness of breath with  activity.   REVIEW OF SYSTEMS:  The patient reports some wheezing, occasional coughing  and shortness of breath even at rest. She has also developed a rash over the  last 24 hours.   PAST MEDICAL HISTORY:  Positive for atrial fibrillation, anemia, diabetes  type 2, hyperthyroidism with adenoma, CHF, chronic venous insufficiency,  chronic renal insufficiency, IgG monoclonal gammopathy, B12 deficiency,  questionable cancer, gastritis, diabetic retinopathy and neuropathy.   FAMILY HISTORY:  Noncontributory.   SOCIAL HISTORY:  The patient lives  alone, was independent prior to arrival  using a cane.   MEDICATIONS PRIOR TO ARRIVAL:  Include Coumadin, Avandia, methimazole,  Cardizem CD, atenolol, benazepril, Glucophage, glipizide, Protonix.   ALLERGIES:  No known drug allergies.   LABORATORY:  As of December 20 include a hemoglobin 10.4, white count 7.3,  platelets 292. Sodium 137, potassium 3.2, BUN and creatinine of 9 and 1.0.   PHYSICAL EXAMINATION:  VITAL SIGNS:  Blood pressure is 140/84, pulse is 88  and irregular.  CHEST:  Revealed wheezes and scattered rhonchi with some decreased sounds  bilaterally right greater than left at the bases.  GENERAL APPEARANCE:  Pleasant, although she was a bit flat and needed cues  to demonstrate significant emotion. She is obese with edema in both lower  extremities rated at 2+ and pitting.  HEENT:  Pupils equal, round, and reactive to light and accommodation.  Extraocular eye movements are intact. Ear, nose, and throat exam was  unremarkable with oral mucosa pink and moist.  NECK:  Supple without JVD or lymphadenopathy.  ABDOMEN:  Soft, nontender, and bowel sounds were post.  MUSCULOSKELETAL:  The patient had intact range of motion. She had strength  at 4+/5 to 5/5 throughout with strength a  bit weaker proximal to distally.  NEUROLOGIC:  Cranial nerves II-XII are normal. Reflexes were 1+. Sensation  appeared to be normal with some decrease in the distal extremities.  Cognition was generally appropriate.  SKIN:  There was diffuse macular rash in ring-like formations, particularly  on the proximal legs and arms. She developed a few spots on the head as  well. No papula were seen.   ASSESSMENT AND PLAN:  1.  Function deficit secondary to deconditioning from multiple medical      complication. Begin subacute level rehab with supervision to modified      independent goals. Prognosis is fair to good. Estimated length of stay      is 2 weeks.  2.  Pain management with p.r.n. oxycodone.  3.   Anticoagulation for deep venous thrombosis prophylaxis and atrial      fibrillation with Coumadin.  4.  Diabetes type 2. Check CBGs a.c. and h.s. Resume Glucophage and observe      off Lantus insulin.  5.  Atrial fibrillation. Observe with decreased dose of Cardizem.  6.  Rash. Will scheduled Benadryl as well as look for any other offending      medications. Will give the patient IM dose of Benadryl today.  7.  Hyperthyroidism. Continue Tapazole.      Ranelle Oyster, M.D.  Electronically Signed     ZTS/MEDQ  D:  05/07/2005  T:  05/08/2005  Job:  [id number]

## 2010-10-08 ENCOUNTER — Encounter: Payer: Self-pay | Admitting: Internal Medicine

## 2010-10-08 ENCOUNTER — Ambulatory Visit (INDEPENDENT_AMBULATORY_CARE_PROVIDER_SITE_OTHER): Payer: PRIVATE HEALTH INSURANCE | Admitting: Internal Medicine

## 2010-10-08 DIAGNOSIS — E119 Type 2 diabetes mellitus without complications: Secondary | ICD-10-CM

## 2010-10-08 DIAGNOSIS — E785 Hyperlipidemia, unspecified: Secondary | ICD-10-CM

## 2010-10-08 DIAGNOSIS — D518 Other vitamin B12 deficiency anemias: Secondary | ICD-10-CM

## 2010-10-08 DIAGNOSIS — I10 Essential (primary) hypertension: Secondary | ICD-10-CM

## 2010-10-08 DIAGNOSIS — E039 Hypothyroidism, unspecified: Secondary | ICD-10-CM

## 2010-10-08 NOTE — Assessment & Plan Note (Signed)
Lipid profile in January was normal. She is not taking her pravastatin. Will recheck again at next visit. We emphasized compliance with a statin.

## 2010-10-08 NOTE — Assessment & Plan Note (Signed)
Acceptabl. tinue current medications.

## 2010-10-08 NOTE — Patient Instructions (Addendum)
Follow up in 3 months. Come fasting at that visit for cholesterol check

## 2010-10-08 NOTE — Progress Notes (Signed)
76 year old with history of hypertension, diabetes, hyperlipidemia, vitamin B12 deficiency anemia, chronic renal insufficiency and hypothyroidism comes to the clinic for follow. She also has a history of bilateral total amputations due to osteomyelitis.  No new complaints today. Was seen by Dr. Lajoyce Corners last month and is going to see him again this week for her right foot ulcer. Follows with the cancer [facility] for B12 injection and some other injection for her anemia.  BP 142/81  Pulse 88  Temp(Src) 97.6 F (36.4 C) (Oral)  Ht 5' 5.3" (1.659 m)  Wt 199 lb 8 oz (90.493 kg)  BMI 32.89 kg/m2  SpO2 99%  General Appearance:    Alert, cooperative, no distress, appears stated age  Head:    Normocephalic, without obvious abnormality, atraumatic  Eyes:    PERRL, conjunctiva/corneas clear, EOM's intact, fundi    benign, both eyes  Ears:    Normal TM's and external ear canals, both ears  Nose:   Nares normal, septum midline, mucosa normal, no drainage    or sinus tenderness  Throat:   Lips, mucosa, and tongue normal; teeth and gums normal  Neck:   Supple, symmetrical, trachea midline, no adenopathy;    thyroid:  no enlargement/tenderness/nodules; no carotid   bruit or JVD  Back:     Symmetric, no curvature, ROM normal, no CVA tenderness  Lungs:     Clear to auscultation bilaterally, respirations unlabored  Chest Wall:    No tenderness or deformity   Heart:    Regular rate and rhythm, S1 and S2 normal, no murmur, rub   or gallop  Abdomen:     Soft, non-tender, bowel sounds active all four quadrants,    no masses, no organomegaly  Extremities:  all toes amputated on left and rt 2nd toe amputation on rt. Quarter sized ulcer on sole of rt toe extended up to deep muscle, no bone involvement, no infection  Pulses:   2+ and symmetric all extremities  Skin:   Skin color, texture, turgor normal, no rashes or lesions  Lymph nodes:   Cervical, supraclavicular, and axillary nodes normal  Neurologic:    CNII-XII intact, normal strength, sensation and reflexes    throughout   Constitutional: Denies fever, chills, diaphoresis, appetite change and fatigue.  HEENT: Denies photophobia, eye pain, redness, hearing loss, ear pain, congestion, sore throat, rhinorrhea, sneezing, mouth sores, trouble swallowing, neck pain, neck stiffness and tinnitus.   Respiratory: Denies SOB, DOE, cough, chest tightness,  and wheezing.   Cardiovascular: Denies chest pain, palpitations and leg swelling.  Gastrointestinal: Denies nausea, vomiting, abdominal pain, diarrhea, constipation, blood in stool and abdominal distention.  Genitourinary: Denies dysuria, urgency, frequency, hematuria, flank pain and difficulty urinating.  Musculoskeletal: Denies myalgias, back pain, joint swelling, arthralgias and gait problem.  Skin: Denies pallor, rash and wound.  Neurological: Denies dizziness, seizures, syncope, weakness, light-headedness, numbness and headaches.  Hematological: Denies adenopathy. Easy bruising, personal or family bleeding history  Psychiatric/Behavioral: Denies suicidal ideation, mood changes, confusion, nervousness, sleep disturbance and agitation

## 2010-10-08 NOTE — Assessment & Plan Note (Addendum)
She is now taking iron tablets, she might be getting iron injections at the cancer Center. Will try to get records from there. Also gets B12 injections at the cancer center. CBC done at the cancer center in January showed a hemoglobin of 11.5 it seems to be at about her baseline.

## 2010-10-08 NOTE — Assessment & Plan Note (Signed)
HbA1c 6. Continue current medication without any changes.

## 2010-10-08 NOTE — Assessment & Plan Note (Signed)
Check TSH level today was abnormal during the last test. Compliant with Synthroid. No symptoms of hypothyroidism.

## 2010-10-17 ENCOUNTER — Encounter (HOSPITAL_BASED_OUTPATIENT_CLINIC_OR_DEPARTMENT_OTHER): Payer: PRIVATE HEALTH INSURANCE | Admitting: Oncology

## 2010-10-17 ENCOUNTER — Other Ambulatory Visit (HOSPITAL_COMMUNITY): Payer: Self-pay | Admitting: Oncology

## 2010-10-17 DIAGNOSIS — D649 Anemia, unspecified: Secondary | ICD-10-CM

## 2010-10-17 LAB — CBC WITH DIFFERENTIAL/PLATELET
BASO%: 0.3 % (ref 0.0–2.0)
Basophils Absolute: 0 10*3/uL (ref 0.0–0.1)
EOS%: 1.1 % (ref 0.0–7.0)
HCT: 36.5 % (ref 34.8–46.6)
HGB: 11.9 g/dL (ref 11.6–15.9)
LYMPH%: 13.7 % — ABNORMAL LOW (ref 14.0–49.7)
MCH: 28.2 pg (ref 25.1–34.0)
MCHC: 32.7 g/dL (ref 31.5–36.0)
NEUT%: 73.3 % (ref 38.4–76.8)
Platelets: 132 10*3/uL — ABNORMAL LOW (ref 145–400)

## 2010-10-21 ENCOUNTER — Emergency Department (HOSPITAL_COMMUNITY)
Admission: EM | Admit: 2010-10-21 | Discharge: 2010-10-21 | Disposition: A | Discharge status: Home / Self Care | Payer: PRIVATE HEALTH INSURANCE | Attending: Emergency Medicine | Admitting: Emergency Medicine

## 2010-10-21 ENCOUNTER — Emergency Department (HOSPITAL_COMMUNITY): Payer: PRIVATE HEALTH INSURANCE

## 2010-10-21 DIAGNOSIS — Z79899 Other long term (current) drug therapy: Secondary | ICD-10-CM | POA: Insufficient documentation

## 2010-10-21 DIAGNOSIS — N289 Disorder of kidney and ureter, unspecified: Secondary | ICD-10-CM | POA: Insufficient documentation

## 2010-10-21 DIAGNOSIS — Y998 Other external cause status: Secondary | ICD-10-CM | POA: Insufficient documentation

## 2010-10-21 DIAGNOSIS — I10 Essential (primary) hypertension: Secondary | ICD-10-CM | POA: Insufficient documentation

## 2010-10-21 DIAGNOSIS — Y92009 Unspecified place in unspecified non-institutional (private) residence as the place of occurrence of the external cause: Secondary | ICD-10-CM | POA: Insufficient documentation

## 2010-10-21 DIAGNOSIS — S1093XA Contusion of unspecified part of neck, initial encounter: Secondary | ICD-10-CM | POA: Insufficient documentation

## 2010-10-21 DIAGNOSIS — I2510 Atherosclerotic heart disease of native coronary artery without angina pectoris: Secondary | ICD-10-CM | POA: Insufficient documentation

## 2010-10-21 DIAGNOSIS — E119 Type 2 diabetes mellitus without complications: Secondary | ICD-10-CM | POA: Insufficient documentation

## 2010-10-21 DIAGNOSIS — S0003XA Contusion of scalp, initial encounter: Secondary | ICD-10-CM | POA: Insufficient documentation

## 2010-10-21 DIAGNOSIS — E860 Dehydration: Secondary | ICD-10-CM | POA: Insufficient documentation

## 2010-10-21 DIAGNOSIS — W1811XA Fall from or off toilet without subsequent striking against object, initial encounter: Secondary | ICD-10-CM | POA: Insufficient documentation

## 2010-10-21 DIAGNOSIS — Y93E8 Activity, other personal hygiene: Secondary | ICD-10-CM | POA: Insufficient documentation

## 2010-10-21 DIAGNOSIS — N390 Urinary tract infection, site not specified: Secondary | ICD-10-CM | POA: Insufficient documentation

## 2010-10-21 LAB — URINE MICROSCOPIC-ADD ON

## 2010-10-21 LAB — CBC
HCT: 36.8 % (ref 36.0–46.0)
Hemoglobin: 11.8 g/dL — ABNORMAL LOW (ref 12.0–15.0)
MCH: 27.1 pg (ref 26.0–34.0)
MCHC: 32.1 g/dL (ref 30.0–36.0)
RDW: 14.8 % (ref 11.5–15.5)

## 2010-10-21 LAB — BASIC METABOLIC PANEL
Chloride: 102 mEq/L (ref 96–112)
GFR calc Af Amer: 34 mL/min — ABNORMAL LOW (ref >60)
GFR calc non Af Amer: 28 mL/min — ABNORMAL LOW (ref >60)
Potassium: 3.6 mEq/L (ref 3.5–5.1)
Sodium: 138 mEq/L (ref 135–145)

## 2010-10-21 LAB — DIFFERENTIAL
Basophils Absolute: 0 10*3/uL (ref 0.0–0.1)
Basophils Relative: 0 % (ref 0–1)
Eosinophils Relative: 3 % (ref 0–5)
Lymphocytes Relative: 25 % (ref 12–46)
Monocytes Absolute: 1 10*3/uL (ref 0.1–1.0)
Monocytes Relative: 10 % (ref 3–12)
Neutro Abs: 5.7 10*3/uL (ref 1.7–7.7)

## 2010-10-21 LAB — CK TOTAL AND CKMB (NOT AT ARMC): CK, MB: 1.7 ng/mL (ref 0.3–4.0)

## 2010-10-21 LAB — URINALYSIS, ROUTINE W REFLEX MICROSCOPIC
Glucose, UA: NEGATIVE mg/dL
Ketones, ur: NEGATIVE mg/dL
Nitrite: NEGATIVE
Specific Gravity, Urine: 1.011 (ref 1.005–1.030)
pH: 6 (ref 5.0–8.0)

## 2010-10-21 LAB — TROPONIN I: Troponin I: <0.3 ng/mL (ref <0.30)

## 2010-10-24 ENCOUNTER — Other Ambulatory Visit: Payer: Self-pay | Admitting: *Deleted

## 2010-10-24 MED ORDER — METOPROLOL TARTRATE 25 MG PO TABS: 25.0000 mg | ORAL_TABLET | Freq: Two times a day (BID) | ORAL | Status: DC

## 2010-10-31 ENCOUNTER — Encounter (HOSPITAL_BASED_OUTPATIENT_CLINIC_OR_DEPARTMENT_OTHER): Payer: PRIVATE HEALTH INSURANCE | Admitting: Oncology

## 2010-10-31 ENCOUNTER — Other Ambulatory Visit (HOSPITAL_COMMUNITY): Payer: Self-pay | Admitting: Oncology

## 2010-10-31 DIAGNOSIS — D631 Anemia in chronic kidney disease: Secondary | ICD-10-CM

## 2010-10-31 DIAGNOSIS — N289 Disorder of kidney and ureter, unspecified: Secondary | ICD-10-CM

## 2010-10-31 DIAGNOSIS — D472 Monoclonal gammopathy: Secondary | ICD-10-CM

## 2010-10-31 DIAGNOSIS — N189 Chronic kidney disease, unspecified: Secondary | ICD-10-CM

## 2010-10-31 DIAGNOSIS — D510 Vitamin B12 deficiency anemia due to intrinsic factor deficiency: Secondary | ICD-10-CM

## 2010-10-31 DIAGNOSIS — D649 Anemia, unspecified: Secondary | ICD-10-CM

## 2010-10-31 LAB — CBC WITH DIFFERENTIAL/PLATELET
EOS%: 3.5 % (ref 0.0–7.0)
Eosinophils Absolute: 0.2 10*3/uL (ref 0.0–0.5)
LYMPH%: 19.5 % (ref 14.0–49.7)
MCH: 27.9 pg (ref 25.1–34.0)
MCV: 86.1 fL (ref 79.5–101.0)
MONO%: 12.1 % (ref 0.0–14.0)
NEUT#: 4.5 10*3/uL (ref 1.5–6.5)
Platelets: 137 10*3/uL — ABNORMAL LOW (ref 145–400)
RBC: 4.01 10*6/uL (ref 3.70–5.45)

## 2010-12-12 ENCOUNTER — Other Ambulatory Visit (HOSPITAL_COMMUNITY): Payer: Self-pay | Admitting: Oncology

## 2010-12-12 ENCOUNTER — Encounter (HOSPITAL_BASED_OUTPATIENT_CLINIC_OR_DEPARTMENT_OTHER): Payer: PRIVATE HEALTH INSURANCE | Admitting: Oncology

## 2010-12-12 DIAGNOSIS — N289 Disorder of kidney and ureter, unspecified: Secondary | ICD-10-CM

## 2010-12-12 DIAGNOSIS — D649 Anemia, unspecified: Secondary | ICD-10-CM

## 2010-12-12 LAB — CBC WITH DIFFERENTIAL/PLATELET
BASO%: 0.2 % (ref 0.0–2.0)
LYMPH%: 23.3 % (ref 14.0–49.7)
MCHC: 31.5 g/dL (ref 31.5–36.0)
MCV: 86.6 fL (ref 79.5–101.0)
MONO#: 0.8 10*3/uL (ref 0.1–0.9)
MONO%: 9.9 % (ref 0.0–14.0)
Platelets: 149 10*3/uL (ref 145–400)
RBC: 3.44 10*6/uL — ABNORMAL LOW (ref 3.70–5.45)
WBC: 8.3 10*3/uL (ref 3.9–10.3)

## 2010-12-26 ENCOUNTER — Encounter (HOSPITAL_BASED_OUTPATIENT_CLINIC_OR_DEPARTMENT_OTHER): Payer: PRIVATE HEALTH INSURANCE | Admitting: Oncology

## 2010-12-26 ENCOUNTER — Other Ambulatory Visit (HOSPITAL_COMMUNITY): Payer: Self-pay | Admitting: Oncology

## 2010-12-26 DIAGNOSIS — D472 Monoclonal gammopathy: Secondary | ICD-10-CM

## 2010-12-26 DIAGNOSIS — N289 Disorder of kidney and ureter, unspecified: Secondary | ICD-10-CM

## 2010-12-26 DIAGNOSIS — D649 Anemia, unspecified: Secondary | ICD-10-CM

## 2010-12-26 DIAGNOSIS — D510 Vitamin B12 deficiency anemia due to intrinsic factor deficiency: Secondary | ICD-10-CM

## 2010-12-26 LAB — CBC WITH DIFFERENTIAL/PLATELET
BASO%: 0.4 % (ref 0.0–2.0)
MCHC: 33 g/dL (ref 31.5–36.0)
MONO#: 0.8 10*3/uL (ref 0.1–0.9)
RBC: 3.28 10*6/uL — ABNORMAL LOW (ref 3.70–5.45)
WBC: 7 10*3/uL (ref 3.9–10.3)
lymph#: 1.2 10*3/uL (ref 0.9–3.3)

## 2010-12-26 LAB — COMPREHENSIVE METABOLIC PANEL
ALT: <8 U/L (ref 0–35)
AST: 10 U/L (ref 0–37)
Albumin: 3.7 g/dL (ref 3.5–5.2)
Calcium: 8.8 mg/dL (ref 8.4–10.5)
Chloride: 107 mEq/L (ref 96–112)
Creatinine, Ser: 2.23 mg/dL — ABNORMAL HIGH (ref 0.50–1.10)
Potassium: 4.6 mEq/L (ref 3.5–5.3)

## 2010-12-26 LAB — LACTATE DEHYDROGENASE: LDH: 128 U/L (ref 94–250)

## 2011-01-07 ENCOUNTER — Telehealth: Payer: Self-pay | Admitting: *Deleted

## 2011-01-07 NOTE — Telephone Encounter (Signed)
Pt is notified per dr Scot Dock to stop lisinopril and call for appt in am, pt verb understanding

## 2011-01-09 ENCOUNTER — Other Ambulatory Visit (HOSPITAL_COMMUNITY): Payer: Self-pay | Admitting: Oncology

## 2011-01-09 ENCOUNTER — Encounter (HOSPITAL_BASED_OUTPATIENT_CLINIC_OR_DEPARTMENT_OTHER): Payer: PRIVATE HEALTH INSURANCE | Admitting: Oncology

## 2011-01-09 DIAGNOSIS — N289 Disorder of kidney and ureter, unspecified: Secondary | ICD-10-CM

## 2011-01-09 DIAGNOSIS — D649 Anemia, unspecified: Secondary | ICD-10-CM

## 2011-01-09 LAB — CBC WITH DIFFERENTIAL/PLATELET
BASO%: 0.3 % (ref 0.0–2.0)
Basophils Absolute: 0 10*3/uL (ref 0.0–0.1)
HGB: 10.4 g/dL — ABNORMAL LOW (ref 11.6–15.9)
MCH: 29.1 pg (ref 25.1–34.0)
MCV: 89.2 fL (ref 79.5–101.0)
RDW: 16.3 % — ABNORMAL HIGH (ref 11.2–14.5)

## 2011-01-14 ENCOUNTER — Other Ambulatory Visit: Payer: Self-pay | Admitting: Internal Medicine

## 2011-01-15 ENCOUNTER — Encounter: Payer: PRIVATE HEALTH INSURANCE | Admitting: Internal Medicine

## 2011-01-24 ENCOUNTER — Other Ambulatory Visit: Payer: Self-pay

## 2011-01-24 ENCOUNTER — Ambulatory Visit (INDEPENDENT_AMBULATORY_CARE_PROVIDER_SITE_OTHER): Payer: PRIVATE HEALTH INSURANCE | Admitting: Internal Medicine

## 2011-01-24 ENCOUNTER — Encounter: Payer: Self-pay | Admitting: Internal Medicine

## 2011-01-24 ENCOUNTER — Ambulatory Visit (HOSPITAL_COMMUNITY)
Admission: RE | Admit: 2011-01-24 | Discharge: 2011-01-24 | Disposition: A | Discharge status: Home / Self Care | Payer: PRIVATE HEALTH INSURANCE | Source: Ambulatory Visit | Attending: Internal Medicine | Admitting: Internal Medicine

## 2011-01-24 VITALS — BP 170/83 | HR 86 | Temp 97.4°F | Ht 68.0 in | Wt 197.4 lb

## 2011-01-24 DIAGNOSIS — D518 Other vitamin B12 deficiency anemias: Secondary | ICD-10-CM

## 2011-01-24 DIAGNOSIS — E039 Hypothyroidism, unspecified: Secondary | ICD-10-CM

## 2011-01-24 DIAGNOSIS — M86179 Other acute osteomyelitis, unspecified ankle and foot: Secondary | ICD-10-CM

## 2011-01-24 DIAGNOSIS — E119 Type 2 diabetes mellitus without complications: Secondary | ICD-10-CM

## 2011-01-24 DIAGNOSIS — N259 Disorder resulting from impaired renal tubular function, unspecified: Secondary | ICD-10-CM

## 2011-01-24 DIAGNOSIS — I10 Essential (primary) hypertension: Secondary | ICD-10-CM

## 2011-01-24 DIAGNOSIS — I4891 Unspecified atrial fibrillation: Secondary | ICD-10-CM | POA: Insufficient documentation

## 2011-01-24 DIAGNOSIS — E785 Hyperlipidemia, unspecified: Secondary | ICD-10-CM

## 2011-01-24 DIAGNOSIS — I499 Cardiac arrhythmia, unspecified: Secondary | ICD-10-CM | POA: Insufficient documentation

## 2011-01-24 DIAGNOSIS — R9431 Abnormal electrocardiogram [ECG] [EKG]: Secondary | ICD-10-CM | POA: Insufficient documentation

## 2011-01-24 LAB — CBC WITH DIFFERENTIAL/PLATELET
Basophils Absolute: 0 10*3/uL (ref 0.0–0.1)
Eosinophils Relative: 3 % (ref 0–5)
HCT: 39.8 % (ref 36.0–46.0)
Hemoglobin: 12 g/dL (ref 12.0–15.0)
Lymphocytes Relative: 22 % (ref 12–46)
Lymphs Abs: 1.7 10*3/uL (ref 0.7–4.0)
MCV: 93 fL (ref 78.0–100.0)
Monocytes Absolute: 1 10*3/uL (ref 0.1–1.0)
Monocytes Relative: 12 % (ref 3–12)
Neutro Abs: 4.8 10*3/uL (ref 1.7–7.7)
RBC: 4.28 MIL/uL (ref 3.87–5.11)
WBC: 7.7 10*3/uL (ref 4.0–10.5)

## 2011-01-24 LAB — T4, FREE: Free T4: 1.33 ng/dL (ref 0.80–1.80)

## 2011-01-24 LAB — LIPID PANEL
Cholesterol: 194 mg/dL (ref 0–200)
HDL: 74 mg/dL (ref >39)
Triglycerides: 62 mg/dL (ref <150)

## 2011-01-24 LAB — BASIC METABOLIC PANEL
BUN: 37 mg/dL — ABNORMAL HIGH (ref 6–23)
CO2: 24 mEq/L (ref 19–32)
Calcium: 8.6 mg/dL (ref 8.4–10.5)
Chloride: 104 mEq/L (ref 96–112)
Creat: 1.86 mg/dL — ABNORMAL HIGH (ref 0.50–1.10)
Glucose, Bld: 88 mg/dL (ref 70–99)

## 2011-01-24 LAB — GLUCOSE, CAPILLARY: Glucose-Capillary: 97 mg/dL (ref 70–99)

## 2011-01-24 LAB — POCT GLYCOSYLATED HEMOGLOBIN (HGB A1C): Hemoglobin A1C: 5.5

## 2011-01-24 MED ORDER — GLIPIZIDE 10 MG PO TABS: 10.0000 mg | ORAL_TABLET | Freq: Two times a day (BID) | ORAL | Status: DC

## 2011-01-24 MED ORDER — LEVOTHYROXINE SODIUM 112 MCG PO TABS: 112.0000 ug | ORAL_TABLET | Freq: Every day | ORAL | Status: DC

## 2011-01-24 MED ORDER — DOCUSATE SODIUM 100 MG PO CAPS: 100.0000 mg | ORAL_CAPSULE | Freq: Two times a day (BID) | ORAL | Status: DC | PRN

## 2011-01-24 MED ORDER — METOPROLOL TARTRATE 50 MG PO TABS: 50.0000 mg | ORAL_TABLET | Freq: Two times a day (BID) | ORAL | Status: DC

## 2011-01-24 MED ORDER — DABIGATRAN ETEXILATE MESYLATE 150 MG PO CAPS: 150.0000 mg | ORAL_CAPSULE | Freq: Two times a day (BID) | ORAL | Status: DC

## 2011-01-24 MED ORDER — SITAGLIPTIN PHOSPHATE 100 MG PO TABS: 100.0000 mg | ORAL_TABLET | Freq: Every day | ORAL | Status: DC

## 2011-01-24 NOTE — Progress Notes (Signed)
HPI The patient is a 76 yo woman with a history of hypothyroidism, DM, HL, vit B12 and iron deficiency anemia, and HTN, presenting for a follow-up visit.  The patient was called 2 weeks ago and told to stop her lisinopril due to elevated creatinine (2.23, above her baseline around 1.8), and today the patient's BP is elevated.  The patient has a history of HL, with prior LDL values within goal of <213, currently not treated with medications, previously on pravastatin 20.  The patient's diabetes is well-controlled, with no episodes of hypoglycemia, polyuria, polydipsia, blurry vision, or alteration of consciousness at home, and a Hb A1C of 5.5 today.  The patient notes a history of iron deficiency anemia and vitamin B12 deficiency, currently managed with iron injections and B12 injections by Dr. Arline Asp at the cancer center.  The patient is noted to have an irregular heart rate on exam today.  She notes another episode of irregular heart rate (?afib) 10 years ago, for which she was hospitalized, though she spontaneously converted back to sinus rhythm without intervention.  She currently expresses no palpitations, increased SOB, dyspnea, chest pain, lightheadedness, or fatigue.  ROS: General: no fevers, chills, changes in weight, changes in appetite Skin: no rash HEENT: no blurry vision, hearing changes, sore throat Pulm: no dyspnea, coughing, wheezing CV: see HPI Abd: no abdominal pain, nausea/vomiting; she does note occasional constipation GU: no dysuria, hematuria, polyuria Ext: no arthralgias, myalgias Neuro: no weakness, numbness, or tingling  Filed Vitals:   01/24/11 0954  BP: 170/83  Pulse: 86  Temp: 97.4 F (36.3 C)   EKG: atrial fibrillation, with irregularly irregular rhythm, rate in the 80's-90's, no ST abnormalities   PEX General: alert, cooperative, and in no apparent distress HEENT: pupils equal round and reactive to light, vision grossly intact, oropharynx clear and  non-erythematous  Neck: supple, no lymphadenopathy, JVD, or carotid bruits Lungs: clear to ascultation bilaterally, normal work of respiration, no wheezes, rales, ronchi Heart: regular rate, irregularly irregular rhythm, no murmurs, gallops, or rubs Abdomen: soft, non-tender, non-distended, normal bowel sounds Msk: no joint edema, warmth, or erythema Extremities: 2+ DP/PT pulses bilaterally, chronic b/l LE edema Neurologic: alert & oriented X3, cranial nerves II-XII intact, strength grossly intact, sensation intact to light touch  Assessment/Plan

## 2011-01-24 NOTE — Assessment & Plan Note (Signed)
Well-controlled, continue [place] and metformin

## 2011-01-24 NOTE — Assessment & Plan Note (Addendum)
Irregular heart rate noted on exam today, with follow-up EKG showing afib.  The patient notes a history of afib 10 years ago, and no current symptoms of SOB, dyspnea, cp, or palpitations, making it difficult to determine when this current episode began. -patient started on Pradaxa.  The patient was counseled at length about the importance of twice daily compliance with this medication, and given informational handouts about this medication.  The patient has no known history of medication non-compliance. -metoprolol increased to 50 mg BID -patient to follow-up in 2 weeks to evaluate heart rate/rhythm and compliance with Pradaxa

## 2011-01-24 NOTE — Assessment & Plan Note (Signed)
Managed with B12 and iron injections by Dr. Arline Asp.  Patient missed her lab draw appointment at the cancer center yesterday, so we will draw her cbc and fax these results to the cancer center -continue current management by Dr. Arline Asp

## 2011-01-24 NOTE — Patient Instructions (Signed)
Today, you were diagnosed with Atrial Fibrillation, which is an irregular heart rate -atrial fibrillation can increase your risk of blood clots, which can cause conditions such as heart attacks and strokes -to prevent these complications, we are starting you on a new medication, called Pradaxa.  It is very important to take this medication, twice per day, every day!!  If you do not take this medication, you are at a greatly increased risk for a stroke or heart attack. -we are also increasing your Metoprolol to 50 mg twice per day (double your previous dose  We are also checking your cholesterol today, and may have you re-start your anti-cholesterol medication, Pravastatin  For your blood pressure, do not take Lisinopril for now.  We will monitor your kidney function, and may have you re-start this medication in the future  Please return for a follow-up visit in 2 weeks, so that we can evaluate your heart rate.

## 2011-01-24 NOTE — Assessment & Plan Note (Signed)
History of left toe amputation, currently with a chronic right foot ulcer, managed by Dr. Lajoyce Corners, next appointment 9/12 -continue doxycycline per dr. Lajoyce Corners, f/u at your appointment on the 12th

## 2011-01-24 NOTE — Assessment & Plan Note (Signed)
Elevated today to 170/83, 2 weeks after patient stopped lisinopril due to elevated creatinine. -increase metoprolol to 50 mg BID -continue to hold lisinopril, checking bmet today

## 2011-01-27 ENCOUNTER — Other Ambulatory Visit: Payer: Self-pay | Admitting: Internal Medicine

## 2011-01-27 DIAGNOSIS — E785 Hyperlipidemia, unspecified: Secondary | ICD-10-CM

## 2011-01-27 MED ORDER — PRAVASTATIN SODIUM 20 MG PO TABS: 20.0000 mg | ORAL_TABLET | Freq: Every day | ORAL | Status: DC

## 2011-01-27 NOTE — Progress Notes (Signed)
Patient's cholesterol was elevated at her last visit.  Patient had stopped taking her Pravastatin medication, thinking she no longer needed this medication.  I re-ordered this medication, and called the patient to inform her of this change.

## 2011-01-27 NOTE — Progress Notes (Signed)
I have reviewed and discussed the care of this patient in detail with the resident physician including pertinent patient records, physical exam findings and data. I agree with details of this encounter.   

## 2011-02-06 ENCOUNTER — Encounter (HOSPITAL_BASED_OUTPATIENT_CLINIC_OR_DEPARTMENT_OTHER): Payer: PRIVATE HEALTH INSURANCE | Admitting: Oncology

## 2011-02-06 ENCOUNTER — Other Ambulatory Visit (HOSPITAL_COMMUNITY): Payer: Self-pay | Admitting: Oncology

## 2011-02-06 DIAGNOSIS — D649 Anemia, unspecified: Secondary | ICD-10-CM

## 2011-02-06 LAB — CBC WITH DIFFERENTIAL/PLATELET
Eosinophils Absolute: 0.2 10*3/uL (ref 0.0–0.5)
HGB: 11.6 g/dL (ref 11.6–15.9)
MCV: 88.7 fL (ref 79.5–101.0)
MONO%: 10.8 % (ref 0.0–14.0)
NEUT#: 5.2 10*3/uL (ref 1.5–6.5)
RBC: 3.97 10*6/uL (ref 3.70–5.45)
RDW: 15.4 % — ABNORMAL HIGH (ref 11.2–14.5)
WBC: 7.9 10*3/uL (ref 3.9–10.3)
lymph#: 1.6 10*3/uL (ref 0.9–3.3)

## 2011-02-11 ENCOUNTER — Ambulatory Visit (INDEPENDENT_AMBULATORY_CARE_PROVIDER_SITE_OTHER): Payer: PRIVATE HEALTH INSURANCE | Admitting: Internal Medicine

## 2011-02-11 DIAGNOSIS — I10 Essential (primary) hypertension: Secondary | ICD-10-CM

## 2011-02-11 DIAGNOSIS — I4891 Unspecified atrial fibrillation: Secondary | ICD-10-CM

## 2011-02-11 DIAGNOSIS — N259 Disorder resulting from impaired renal tubular function, unspecified: Secondary | ICD-10-CM

## 2011-02-11 DIAGNOSIS — D518 Other vitamin B12 deficiency anemias: Secondary | ICD-10-CM

## 2011-02-11 DIAGNOSIS — N179 Acute kidney failure, unspecified: Secondary | ICD-10-CM

## 2011-02-11 MED ORDER — LISINOPRIL 40 MG PO TABS: 20.0000 mg | ORAL_TABLET | Freq: Every day | ORAL | Status: DC

## 2011-02-11 MED ORDER — ASPIRIN EC 325 MG PO TBEC: 325.0000 mg | DELAYED_RELEASE_TABLET | Freq: Every day | ORAL | Status: AC

## 2011-02-11 MED ORDER — PANTOPRAZOLE SODIUM 40 MG PO TBEC: 40.0000 mg | DELAYED_RELEASE_TABLET | Freq: Every day | ORAL | Status: DC

## 2011-02-11 NOTE — Assessment & Plan Note (Addendum)
Patient stopped taking pradaxa because of bleeding from the foot ulcer. Continue working pradaxa into the foot ulcer heals. With the chance of more than 2 she needs an anticoagulant. For the time being Will start aspirin 325 mg with Protonix. Once the foot ulcer heals, we'll restart pradaxa Rate control with metoprolol.

## 2011-02-11 NOTE — Assessment & Plan Note (Signed)
Restart lisinopril at 20 mg per day. Check BMET at next visit.

## 2011-02-11 NOTE — Progress Notes (Signed)
76 year old woman with past medical history of diabetes, hyperlipidemia, hypertension, atrial fibrillation, hypothyroidism, B12 and iron deficiency anemia comes to the clinic for follow. No new complaints today. Started on pradaxa at the last visit for atrial fibrillation but she stopped taking it yesterday because of bleeding from her foot ulcer. The bleeding stopped after she stopped the Bextra. The bleeding was mild to moderate. Compliant with her medications otherwise.  Physical exam  BP 153/90  Pulse 90  Temp(Src) 97.8 F (36.6 C) (Oral)  Ht 5\' 8"  (1.727 m)  Wt 194 lb 3.2 oz (88.089 kg)  BMI 29.53 kg/m2  SpO2 99%  General Appearance:    Alert, cooperative, no distress, appears stated age  Head:    Normocephalic, without obvious abnormality, atraumatic  Eyes:    PERRL, conjunctiva/corneas clear, EOM's intact, fundi    benign, both eyes  Ears:    Normal TM's and external ear canals, both ears  Nose:   Nares normal, septum midline, mucosa normal, no drainage    or sinus tenderness  Throat:   Lips, mucosa, and tongue normal; teeth and gums normal  Neck:   Supple, symmetrical, trachea midline, no adenopathy;    thyroid:  no enlargement/tenderness/nodules; no carotid   bruit or JVD  Back:     Symmetric, no curvature, ROM normal, no CVA tenderness  Lungs:     Clear to auscultation bilaterally, respirations unlabored  Chest Wall:    No tenderness or deformity   Heart:    irregularly irregular rhythm, S1 and S2 normal, no murmur, rub   or gallop  Abdomen:     Soft, non-tender, bowel sounds active all four quadrants,    no masses, no organomegaly  Extremities:   Extremities normal, atraumatic, no cyanosis or edema. Deep circular 2 cm radius clean-based healing ulcer on the bottom of right toe  Pulses:   2+ and symmetric all extremities  Skin:   Skin color, texture, turgor normal, no rashes or lesions  Lymph nodes:   Cervical, supraclavicular, and axillary nodes normal  Neurologic:    CNII-XII intact, normal strength, sensation and reflexes    throughout   Review of systems As per history of present illness otherwise: Constitutional: Denies fever, chills, diaphoresis, appetite change and fatigue.  HEENT: Denies photophobia, eye pain, redness, hearing loss, ear pain, congestion, sore throat, rhinorrhea, sneezing, mouth sores, trouble swallowing, neck pain, neck stiffness and tinnitus.   Respiratory: Denies SOB, DOE, cough, chest tightness,  and wheezing.   Cardiovascular: Denies chest pain, palpitations and leg swelling.  Gastrointestinal: Denies nausea, vomiting, abdominal pain, diarrhea, constipation, blood in stool and abdominal distention.  Genitourinary: Denies dysuria, urgency, frequency, hematuria, flank pain and difficulty urinating.  Musculoskeletal: Denies myalgias, back pain, joint swelling, arthralgias and gait problem.  Skin: Denies pallor, rash and wound.  Neurological: Denies dizziness, seizures, syncope, weakness, light-headedness, numbness and headaches.  Hematological: Denies adenopathy. Easy bruising, personal or family bleeding history  Psychiatric/Behavioral: Denies suicidal ideation, mood changes, confusion, nervousness, sleep disturbance and agitation

## 2011-02-11 NOTE — Assessment & Plan Note (Signed)
She visited cancer Center last week for treatment. Will get blood work from them.

## 2011-02-11 NOTE — Patient Instructions (Signed)
Follow-up in 2 weeks

## 2011-02-11 NOTE — Assessment & Plan Note (Signed)
Creatinine returning to baseline as of last visit We recheck bmet at next visit. She had blood work done at [facility] lastly we get those records as it might have the creatinine from Last week

## 2011-02-16 ENCOUNTER — Other Ambulatory Visit: Payer: Self-pay | Admitting: Internal Medicine

## 2011-02-20 ENCOUNTER — Other Ambulatory Visit (HOSPITAL_COMMUNITY): Payer: Self-pay | Admitting: Oncology

## 2011-02-20 ENCOUNTER — Encounter (HOSPITAL_BASED_OUTPATIENT_CLINIC_OR_DEPARTMENT_OTHER): Payer: PRIVATE HEALTH INSURANCE | Admitting: Oncology

## 2011-02-20 DIAGNOSIS — D510 Vitamin B12 deficiency anemia due to intrinsic factor deficiency: Secondary | ICD-10-CM

## 2011-02-20 DIAGNOSIS — D649 Anemia, unspecified: Secondary | ICD-10-CM

## 2011-02-20 LAB — CBC WITH DIFFERENTIAL/PLATELET
Basophils Absolute: 0 10*3/uL (ref 0.0–0.1)
Eosinophils Absolute: 0.2 10*3/uL (ref 0.0–0.5)
HCT: 34.7 % — ABNORMAL LOW (ref 34.8–46.6)
LYMPH%: 20.3 % (ref 14.0–49.7)
MCV: 88.2 fL (ref 79.5–101.0)
MONO%: 11.1 % (ref 0.0–14.0)
NEUT#: 5.1 10*3/uL (ref 1.5–6.5)
NEUT%: 65.2 % (ref 38.4–76.8)
Platelets: 149 10*3/uL (ref 145–400)
RBC: 3.94 10*6/uL (ref 3.70–5.45)

## 2011-02-24 ENCOUNTER — Other Ambulatory Visit: Payer: Self-pay | Admitting: Internal Medicine

## 2011-02-24 ENCOUNTER — Ambulatory Visit (HOSPITAL_COMMUNITY)
Admission: RE | Admit: 2011-02-24 | Discharge: 2011-02-24 | Disposition: A | Discharge status: Home / Self Care | Payer: PRIVATE HEALTH INSURANCE | Source: Ambulatory Visit | Attending: Internal Medicine | Admitting: Internal Medicine

## 2011-02-24 DIAGNOSIS — Z452 Encounter for adjustment and management of vascular access device: Secondary | ICD-10-CM

## 2011-03-03 ENCOUNTER — Encounter: Payer: PRIVATE HEALTH INSURANCE | Admitting: Internal Medicine

## 2011-03-06 ENCOUNTER — Encounter (HOSPITAL_BASED_OUTPATIENT_CLINIC_OR_DEPARTMENT_OTHER): Payer: PRIVATE HEALTH INSURANCE | Admitting: Oncology

## 2011-03-06 ENCOUNTER — Other Ambulatory Visit (HOSPITAL_COMMUNITY): Payer: Self-pay | Admitting: Oncology

## 2011-03-06 DIAGNOSIS — D649 Anemia, unspecified: Secondary | ICD-10-CM

## 2011-03-06 LAB — CBC WITH DIFFERENTIAL/PLATELET
Basophils Absolute: 0 10*3/uL (ref 0.0–0.1)
EOS%: 5.2 % (ref 0.0–7.0)
HGB: 11.2 g/dL — ABNORMAL LOW (ref 11.6–15.9)
MCH: 27.5 pg (ref 25.1–34.0)
MCV: 87 fL (ref 79.5–101.0)
MONO%: 12.9 % (ref 0.0–14.0)
RBC: 4.07 10*6/uL (ref 3.70–5.45)
RDW: 14.3 % (ref 11.2–14.5)

## 2011-03-20 ENCOUNTER — Other Ambulatory Visit (HOSPITAL_COMMUNITY): Payer: Self-pay | Admitting: Oncology

## 2011-03-20 ENCOUNTER — Encounter (HOSPITAL_BASED_OUTPATIENT_CLINIC_OR_DEPARTMENT_OTHER): Payer: PRIVATE HEALTH INSURANCE | Admitting: Oncology

## 2011-03-20 DIAGNOSIS — D649 Anemia, unspecified: Secondary | ICD-10-CM

## 2011-03-20 LAB — CBC WITH DIFFERENTIAL/PLATELET
BASO%: 0.2 % (ref 0.0–2.0)
EOS%: 1.8 % (ref 0.0–7.0)
LYMPH%: 21.1 % (ref 14.0–49.7)
MCHC: 32.3 g/dL (ref 31.5–36.0)
MONO#: 1.3 10*3/uL — ABNORMAL HIGH (ref 0.1–0.9)
MONO%: 12.1 % (ref 0.0–14.0)
Platelets: 179 10*3/uL (ref 145–400)
RBC: 4.1 10*6/uL (ref 3.70–5.45)
WBC: 10.8 10*3/uL — ABNORMAL HIGH (ref 3.9–10.3)
nRBC: 0 % (ref 0–0)

## 2011-03-26 NOTE — Progress Notes (Signed)
This encounter was created in error - please disregard.

## 2011-03-28 ENCOUNTER — Encounter: Payer: Self-pay | Admitting: Medical Oncology

## 2011-04-03 ENCOUNTER — Other Ambulatory Visit (HOSPITAL_COMMUNITY): Payer: Self-pay | Admitting: Oncology

## 2011-04-03 ENCOUNTER — Other Ambulatory Visit (HOSPITAL_BASED_OUTPATIENT_CLINIC_OR_DEPARTMENT_OTHER): Payer: PRIVATE HEALTH INSURANCE | Admitting: Lab

## 2011-04-03 ENCOUNTER — Telehealth: Payer: Self-pay | Admitting: Oncology

## 2011-04-03 ENCOUNTER — Ambulatory Visit (HOSPITAL_BASED_OUTPATIENT_CLINIC_OR_DEPARTMENT_OTHER): Payer: PRIVATE HEALTH INSURANCE | Admitting: Oncology

## 2011-04-03 VITALS — BP 135/73 | HR 83 | Temp 97.0°F | Ht 65.5 in | Wt 192.4 lb

## 2011-04-03 DIAGNOSIS — D649 Anemia, unspecified: Secondary | ICD-10-CM

## 2011-04-03 DIAGNOSIS — D510 Vitamin B12 deficiency anemia due to intrinsic factor deficiency: Secondary | ICD-10-CM

## 2011-04-03 DIAGNOSIS — L98499 Non-pressure chronic ulcer of skin of other sites with unspecified severity: Secondary | ICD-10-CM

## 2011-04-03 DIAGNOSIS — N289 Disorder of kidney and ureter, unspecified: Secondary | ICD-10-CM

## 2011-04-03 DIAGNOSIS — N189 Chronic kidney disease, unspecified: Secondary | ICD-10-CM | POA: Insufficient documentation

## 2011-04-03 DIAGNOSIS — D472 Monoclonal gammopathy: Secondary | ICD-10-CM

## 2011-04-03 DIAGNOSIS — D631 Anemia in chronic kidney disease: Secondary | ICD-10-CM | POA: Insufficient documentation

## 2011-04-03 DIAGNOSIS — D518 Other vitamin B12 deficiency anemias: Secondary | ICD-10-CM

## 2011-04-03 LAB — COMPREHENSIVE METABOLIC PANEL
ALT: <8 U/L (ref 0–35)
AST: 9 U/L (ref 0–37)
Alkaline Phosphatase: 78 U/L (ref 39–117)
Creatinine, Ser: 1.91 mg/dL — ABNORMAL HIGH (ref 0.50–1.10)
Sodium: 138 mEq/L (ref 135–145)
Total Bilirubin: 0.4 mg/dL (ref 0.3–1.2)
Total Protein: 7.1 g/dL (ref 6.0–8.3)

## 2011-04-03 LAB — IRON AND TIBC
Iron: 20 ug/dL — ABNORMAL LOW (ref 42–145)
UIBC: 121 ug/dL — ABNORMAL LOW (ref 125–400)

## 2011-04-03 LAB — CBC WITH DIFFERENTIAL/PLATELET
BASO%: 0.6 % (ref 0.0–2.0)
EOS%: 2.8 % (ref 0.0–7.0)
MCH: 28.1 pg (ref 25.1–34.0)
MCHC: 32.8 g/dL (ref 31.5–36.0)
RBC: 3.54 10*6/uL — ABNORMAL LOW (ref 3.70–5.45)
RDW: 14.9 % — ABNORMAL HIGH (ref 11.2–14.5)
lymph#: 1.6 10*3/uL (ref 0.9–3.3)

## 2011-04-03 LAB — FERRITIN: Ferritin: 401 ng/mL — ABNORMAL HIGH (ref 10–291)

## 2011-04-03 LAB — VITAMIN B12: Vitamin B-12: 1322 pg/mL — ABNORMAL HIGH (ref 211–911)

## 2011-04-03 MED ORDER — CYANOCOBALAMIN 1000 MCG/ML IJ SOLN
1000.0000 ug | Freq: Once | INTRAMUSCULAR | Status: AC
  Administered 2011-04-03: 1000 ug via INTRAMUSCULAR

## 2011-04-03 MED ORDER — DARBEPOETIN ALFA-POLYSORBATE 500 MCG/ML IJ SOLN
200.0000 ug | Freq: Once | INTRAMUSCULAR | Status: AC
  Administered 2011-04-03: 200 ug via SUBCUTANEOUS
  Filled 2011-04-03: qty 1

## 2011-04-03 NOTE — Progress Notes (Signed)
This office note has been dictated.  #[id number]

## 2011-04-03 NOTE — Progress Notes (Signed)
CC:   Madaline Guthrie, M.D. Ramiro Harvest, MD Angelia Mould. Derrell Lolling, M.D. Nadara Mustard, MD  HISTORY:  I saw [name] today for followup of her anemia which we feel is most likely due to renal insufficiency.  The patient has a multitude of medical problems which include hypertension, hypothyroidism, dyslipidemia and type 2 diabetes.  The patient has apparently a nonhealing ulcer on the plantar surface of her right foot being attended to by Dr. Aldean Baker.  She has had osteomyelitis of her left foot requiring a left midfoot amputation on 05/30/2010.  The patient also has a history of pernicious anemia and a monoclonal IgA kappa gammopathy of uncertain significance.  The monoclonal gammopathy dates back to October 2005.  The patient's anemia dates back to 2005 as well.  A bone marrow aspirate and biopsy carried out in December 2005 showed increased iron stores and 5% plasma cells, otherwise was normal. The patient gets Aranesp 200 mcg subcu every 2 weeks for hemoglobin less than 11.  She has also received vitamin B12 shots in the past, most recently 07/11/2010.  Today I instructed the patient to try taking vitamin B12 1000 mcg p.o. on a daily basis.  I wrote this out for her. The patient is in a wheelchair.  She denies any significant changes in her condition.  Medicines are reviewed and recorded.  PHYSICAL EXAMINATION:  The patient shows little change.  She looks fairly well.  Weight is fairly stable 192 pounds 6.4 ounces, height 5 feet 5-1/2 inches, body surface area 2.01 m squared.  There is no scleral icterus.  Mouth and pharynx are benign.  The patient has some depigmentation around her mouth.  There is no peripheral adenopathy palpable.  Heart and lungs:  Normal.  Abdomen:  With the patient sitting is benign.  Extremities:  She has some edema and swelling of her right lower leg.  The patient denies any recent change.  This is the leg that she is having an ulcer on the plantar  surface.  No significant edema or swelling of the left lower leg.  Neurologic:  Stable and unchanged.  The patient is a little bit somnolent.  LABORATORY DATA:  Today white count 8.9, ANC 5.8, hemoglobin 9.9, hematocrit 30.3, and platelets 223,000.  I should mention that the patient did not receive any Aranesp since January 09, 2011 when her hemoglobin was 10.4.  Since then, her hemoglobin has been in the 11-12 range.  Chemistries, iron studies and vitamin B12 level are pending. Chemistries from 12/26/2010:  BUN is 43, creatinine 2.23, otherwise normal.  Vitamin B12 level on 12/26/2010 was 342.  On 10/03/2010  vitamin B12 level was 309 and 278 on 07/11/2010.  We have iron studies from 07/11/2010 as follows:  Ferritin was 708, iron saturation 24%.  On 07/11/2010 the IgA level was 593, essentially stable.  IgG and IgM level were normal.  IMPRESSION AND PLAN:  [name]'s clinical status seems to be stable. She is little bit more anemic today, but has not received any Aranesp in quite some time.  She also may have an infection or at least an ulcer in the plantar surface of her right foot that may be interfering with erythropoiesis.  Will give Mrs. Blann today Aranesp 200 mcg subcu.  She will also receive vitamin B12 shot 1000 mcg IM.  I have encouraged her to take vitamin B12 1000 mcg p.o. daily.  I have written this out for her.  For now, will continue with  CBCs every 2 weeks with Aranesp 200 mcg subcu every 2 weeks for hemoglobin less than or equal to 11.  We will plan to see Mrs. Papania again in 4 months at which time will again check CBC, chemistries, vitamin B12 level and quantitative immunoglobulins.    ______________________________ Samul Dada, M.D. DSM/MEDQ  D:  04/03/2011  T:  04/03/2011  Job:  [id number]

## 2011-04-03 NOTE — Telephone Encounter (Signed)
Per pt she will pick up her appt calendar at the next visit due to her ride was  Here and she was ready to be picked up.

## 2011-04-12 ENCOUNTER — Other Ambulatory Visit: Payer: Self-pay | Admitting: Internal Medicine

## 2011-04-14 ENCOUNTER — Other Ambulatory Visit (HOSPITAL_COMMUNITY): Payer: Self-pay | Admitting: Orthopedic Surgery

## 2011-04-15 ENCOUNTER — Other Ambulatory Visit: Payer: Self-pay | Admitting: Internal Medicine

## 2011-04-17 ENCOUNTER — Other Ambulatory Visit (HOSPITAL_COMMUNITY): Payer: Self-pay | Admitting: Oncology

## 2011-04-17 ENCOUNTER — Other Ambulatory Visit (HOSPITAL_BASED_OUTPATIENT_CLINIC_OR_DEPARTMENT_OTHER): Payer: PRIVATE HEALTH INSURANCE | Admitting: Lab

## 2011-04-17 ENCOUNTER — Ambulatory Visit (HOSPITAL_BASED_OUTPATIENT_CLINIC_OR_DEPARTMENT_OTHER): Payer: PRIVATE HEALTH INSURANCE

## 2011-04-17 VITALS — BP 132/73 | HR 84 | Temp 98.3°F

## 2011-04-17 DIAGNOSIS — D649 Anemia, unspecified: Secondary | ICD-10-CM

## 2011-04-17 DIAGNOSIS — N189 Chronic kidney disease, unspecified: Secondary | ICD-10-CM

## 2011-04-17 DIAGNOSIS — N289 Disorder of kidney and ureter, unspecified: Secondary | ICD-10-CM

## 2011-04-17 LAB — CBC WITH DIFFERENTIAL/PLATELET
BASO%: 0.1 % (ref 0.0–2.0)
EOS%: 2.6 % (ref 0.0–7.0)
Eosinophils Absolute: 0.2 10*3/uL (ref 0.0–0.5)
MCH: 27.8 pg (ref 25.1–34.0)
MCHC: 32.5 g/dL (ref 31.5–36.0)
MCV: 85.4 fL (ref 79.5–101.0)
MONO%: 11.3 % (ref 0.0–14.0)
NEUT#: 5.5 10*3/uL (ref 1.5–6.5)
RBC: 3.6 10*6/uL — ABNORMAL LOW (ref 3.70–5.45)
RDW: 16.1 % — ABNORMAL HIGH (ref 11.2–14.5)

## 2011-04-17 MED ORDER — DARBEPOETIN ALFA-POLYSORBATE 500 MCG/ML IJ SOLN
200.0000 ug | Freq: Once | INTRAMUSCULAR | Status: AC
  Administered 2011-04-17: 200 ug via SUBCUTANEOUS
  Filled 2011-04-17: qty 1

## 2011-04-23 ENCOUNTER — Inpatient Hospital Stay (HOSPITAL_COMMUNITY): Admission: RE | Admit: 2011-04-23 | Discharge: 2011-04-23 | Payer: PRIVATE HEALTH INSURANCE | Source: Ambulatory Visit

## 2011-04-28 ENCOUNTER — Encounter (HOSPITAL_COMMUNITY)
Admission: RE | Admit: 2011-04-28 | Discharge: 2011-04-28 | Disposition: A | Discharge status: Home / Self Care | Payer: PRIVATE HEALTH INSURANCE | Source: Ambulatory Visit | Attending: Orthopedic Surgery | Admitting: Orthopedic Surgery

## 2011-04-28 ENCOUNTER — Other Ambulatory Visit: Payer: Self-pay

## 2011-04-28 ENCOUNTER — Encounter (HOSPITAL_COMMUNITY): Payer: Self-pay

## 2011-04-28 HISTORY — DX: Encounter for other specified aftercare: Z51.89

## 2011-04-28 HISTORY — DX: Hypothyroidism, unspecified: E03.9

## 2011-04-28 HISTORY — DX: Reserved for inherently not codable concepts without codable children: IMO0001

## 2011-04-28 LAB — PROTIME-INR: INR: 1.2 (ref 0.00–1.49)

## 2011-04-28 LAB — COMPREHENSIVE METABOLIC PANEL
CO2: 26 mEq/L (ref 19–32)
Calcium: 9.1 mg/dL (ref 8.4–10.5)
Creatinine, Ser: 1.65 mg/dL — ABNORMAL HIGH (ref 0.50–1.10)
GFR calc Af Amer: 34 mL/min — ABNORMAL LOW (ref >90)
GFR calc non Af Amer: 30 mL/min — ABNORMAL LOW (ref >90)
Glucose, Bld: 150 mg/dL — ABNORMAL HIGH (ref 70–99)

## 2011-04-28 LAB — CBC
Hemoglobin: 10.5 g/dL — ABNORMAL LOW (ref 12.0–15.0)
MCH: 27.3 pg (ref 26.0–34.0)
MCHC: 31.3 g/dL (ref 30.0–36.0)
MCV: 87 fL (ref 78.0–100.0)
Platelets: 181 10*3/uL (ref 150–400)
RBC: 3.85 MIL/uL — ABNORMAL LOW (ref 3.87–5.11)

## 2011-04-28 MED ORDER — CEFAZOLIN SODIUM 1-5 GM-% IV SOLN: 1.0000 g | INTRAVENOUS | Status: DC

## 2011-04-28 NOTE — Pre-Procedure Instructions (Signed)
20 [name]  04/28/2011   Your procedure is scheduled on:  04/30/11  Report to [facility] at 0930 AM.  Call this number if you have problems the morning of surgery: [phone]   Remember:   Do not eat food:After Midnight.  May have clear liquids: up to 4 Hours before arrival.0530  Clear liquids include soda, tea, black coffee, apple or grape juice, broth.  Take these medicines the morning of surgery with A SIP OF WATER: doxycycline  Synthroid  metoprolol   Do not wear jewelry, make-up or nail polish.  Do not wear lotions, powders, or perfumes. You may wear deodorant.  Do not shave 48 hours prior to surgery.  Do not bring valuables to the hospital.  Contacts, dentures or bridgework may not be worn into surgery.  Leave suitcase in the car. After surgery it may be brought to your room.  For patients admitted to the hospital, checkout time is 11:00 AM the day of discharge.   Patients discharged the day of surgery will not be allowed to drive home.  Name and phone number of your driver: family   Special Instructions: CHG Shower Use Special Wash: 1/2 bottle night before surgery and 1/2 bottle morning of surgery.   Please read over the following fact sheets that you were given: Pain Booklet, Coughing and Deep Breathing, MRSA Information and Surgical Site Infection Prevention

## 2011-04-29 ENCOUNTER — Encounter (HOSPITAL_COMMUNITY): Payer: Self-pay | Admitting: Vascular Surgery

## 2011-04-29 NOTE — Consult Note (Addendum)
Anesthesia:  Patient is a 76 year old female scheduled for right mid-foot amputation on 04/30/11.  Her past medical history includes HTN, HLD, obesity, chronic venous insufficiency, DM2, CRI, pernicious anemia and monoclonal IgA kappa gammopathy followed by Dr. Arline Asp, hypothyroidism, and atrial fib.  She is s/p a left mid-foot amputation on 05/30/10.  She is followed at [facility] and started on Pradaxa for recurrent a-fib in September, but it was stopped due to bleeding at her foot ulcer site.  She is on Lopressor and lisinopril.    She does not have a primary Cardiologist, but she was evaluated by [facility] during a hospitalization in April of 2011 and had an echocardiogram on 09/03/09 showing mild LVH, LV systolic function was normal, EF 50-55%, LV diffuse hypokinesis, mild AR/MR, LA mildly dilated, PA peak pressure was 39 mm HG.  Her EKG is stable since June of this year when she developed recurrent afib.  Of note, it appears that she was first diagnosed with afib in 2006 and had an echo and stress test then.  The stress test showed no pharmacologically induced myocardial ischemia.  She has no history of known CAD or MI.  Her preoperative labs show a BUN/Cr 30/1.65 and a H/H of 10.5/33.5  Both of these are actually improved since November.  Her INR was 1.2.  CXR yesterday showed no active lung disease. Probable mild scarring at the lung bases, and stable moderate cardiomegaly.  I reviewed her history with Anesthesiologist Dr. Sampson Goon.  Ultimately it is felt she would benefit from further Cardiology evaluation since she has had recurrent afib over the last 6 months; however, if there is no worrisome physical exam findings and if she remains asymptomatic on the day of surgery then Anesthesia feels this could be done post operatively.  She will be evaluated by her Anesthesiologist preoperatively and a definitive anesthesia plan will be discussed at that time.    The patient has  been reluctant to see a Cardiologist in the past since she is being followed regularly by her PCP.  Perhaps her Anesthesiologist can encourage her to set up a Cardiology follow-up appointment in the near future when he or she evaluates her in the morning.  I updated Cheryl at Dr. Audrie Lia office as well.

## 2011-04-29 NOTE — Anesthesia Preprocedure Evaluation (Addendum)
Anesthesia Evaluation  Patient identified by MRN, date of birth, ID band Patient awake    Reviewed: Allergy & Precautions, H&P , NPO status , Patient's Chart, lab work & pertinent test results  Airway Mallampati: II      Dental  (+) Dental Advidsory Given and Edentulous Upper   Pulmonary shortness of breath and with exertion, asthma ,  clear to auscultation        Cardiovascular hypertension, Pt. on medications + dysrhythmias Atrial Fibrillation  Afib, 2006.  Recurrent afib 10/2010. Echo 4/11 NL LV sys function, EF 50-55%, mild AR/MR   Neuro/Psych    GI/Hepatic GERD-  ,  Endo/Other  Diabetes mellitus-Hypothyroidism   Renal/GU Renal disease     Musculoskeletal   Abdominal   Peds  Hematology   Anesthesia Other Findings   Reproductive/Obstetrics                       Anesthesia Physical Anesthesia Plan  ASA: III  Anesthesia Plan: General   Post-op Pain Management:    Induction: Intravenous  Airway Management Planned: LMA  Additional Equipment:   Intra-op Plan:   Post-operative Plan:   Informed Consent: I have reviewed the patients History and Physical, chart, labs and discussed the procedure including the risks, benefits and alternatives for the proposed anesthesia with the patient or authorized representative who has indicated his/her understanding and acceptance.     Plan Discussed with: CRNA  Anesthesia Plan Comments: (See my consult note.  Cardiac notes/tests under Cardiology tab or in Epic.  Shonna Chock, PA-C)       Anesthesia Quick Evaluation

## 2011-04-30 ENCOUNTER — Encounter (HOSPITAL_COMMUNITY): Payer: Self-pay | Admitting: Vascular Surgery

## 2011-04-30 ENCOUNTER — Inpatient Hospital Stay (HOSPITAL_COMMUNITY)
Admission: RE | Admit: 2011-04-30 | Discharge: 2011-05-07 | DRG: 617 | Disposition: A | Discharge status: Skilled Nursing Facility | Payer: PRIVATE HEALTH INSURANCE | Source: Ambulatory Visit | Attending: Orthopedic Surgery | Admitting: Orthopedic Surgery

## 2011-04-30 ENCOUNTER — Encounter (HOSPITAL_COMMUNITY): Payer: Self-pay | Admitting: Anesthesiology

## 2011-04-30 ENCOUNTER — Encounter (HOSPITAL_COMMUNITY)
Admission: RE | Disposition: A | Discharge status: Skilled Nursing Facility | Payer: Self-pay | Source: Ambulatory Visit | Attending: Orthopedic Surgery

## 2011-04-30 ENCOUNTER — Encounter (HOSPITAL_COMMUNITY): Payer: Self-pay | Admitting: *Deleted

## 2011-04-30 ENCOUNTER — Other Ambulatory Visit: Payer: Self-pay | Admitting: Orthopedic Surgery

## 2011-04-30 ENCOUNTER — Ambulatory Visit (HOSPITAL_COMMUNITY): Payer: PRIVATE HEALTH INSURANCE | Admitting: Vascular Surgery

## 2011-04-30 DIAGNOSIS — M9080 Osteopathy in diseases classified elsewhere, unspecified site: Secondary | ICD-10-CM | POA: Diagnosis present

## 2011-04-30 DIAGNOSIS — N179 Acute kidney failure, unspecified: Secondary | ICD-10-CM

## 2011-04-30 DIAGNOSIS — K219 Gastro-esophageal reflux disease without esophagitis: Secondary | ICD-10-CM | POA: Diagnosis present

## 2011-04-30 DIAGNOSIS — D518 Other vitamin B12 deficiency anemias: Secondary | ICD-10-CM

## 2011-04-30 DIAGNOSIS — E119 Type 2 diabetes mellitus without complications: Secondary | ICD-10-CM

## 2011-04-30 DIAGNOSIS — E785 Hyperlipidemia, unspecified: Secondary | ICD-10-CM

## 2011-04-30 DIAGNOSIS — M86179 Other acute osteomyelitis, unspecified ankle and foot: Secondary | ICD-10-CM

## 2011-04-30 DIAGNOSIS — I4891 Unspecified atrial fibrillation: Secondary | ICD-10-CM

## 2011-04-30 DIAGNOSIS — E1169 Type 2 diabetes mellitus with other specified complication: Principal | ICD-10-CM | POA: Diagnosis present

## 2011-04-30 DIAGNOSIS — E1159 Type 2 diabetes mellitus with other circulatory complications: Secondary | ICD-10-CM | POA: Diagnosis present

## 2011-04-30 DIAGNOSIS — I10 Essential (primary) hypertension: Secondary | ICD-10-CM

## 2011-04-30 DIAGNOSIS — E039 Hypothyroidism, unspecified: Secondary | ICD-10-CM | POA: Diagnosis present

## 2011-04-30 DIAGNOSIS — S98139A Complete traumatic amputation of one unspecified lesser toe, initial encounter: Secondary | ICD-10-CM

## 2011-04-30 DIAGNOSIS — L97509 Non-pressure chronic ulcer of other part of unspecified foot with unspecified severity: Secondary | ICD-10-CM | POA: Diagnosis present

## 2011-04-30 DIAGNOSIS — I96 Gangrene, not elsewhere classified: Secondary | ICD-10-CM | POA: Diagnosis present

## 2011-04-30 DIAGNOSIS — M869 Osteomyelitis, unspecified: Secondary | ICD-10-CM | POA: Diagnosis present

## 2011-04-30 DIAGNOSIS — N259 Disorder resulting from impaired renal tubular function, unspecified: Secondary | ICD-10-CM

## 2011-04-30 HISTORY — PX: AMPUTATION: SHX166

## 2011-04-30 LAB — GLUCOSE, CAPILLARY
Glucose-Capillary: 92 mg/dL (ref 70–99)
Glucose-Capillary: 116 mg/dL — ABNORMAL HIGH (ref 70–99)
Glucose-Capillary: 105 mg/dL — ABNORMAL HIGH (ref 70–99)

## 2011-04-30 SURGERY — AMPUTATION, FOOT, PARTIAL
Anesthesia: Choice | Site: Foot | Laterality: Right | Wound class: Dirty or Infected

## 2011-04-30 MED ORDER — LACTATED RINGERS IV SOLN: INTRAVENOUS | Status: DC

## 2011-04-30 MED ORDER — SIMVASTATIN 5 MG PO TABS
5.0000 mg | ORAL_TABLET | Freq: Every day | ORAL | Status: DC
  Administered 2011-04-30 – 2011-05-06 (×7): 5 mg via ORAL
  Filled 2011-04-30 (×8): qty 1

## 2011-04-30 MED ORDER — PROMETHAZINE HCL 25 MG/ML IJ SOLN: 6.2500 mg | INTRAMUSCULAR | Status: DC | PRN

## 2011-04-30 MED ORDER — LACTATED RINGERS IV SOLN
INTRAVENOUS | Status: DC | PRN
  Administered 2011-04-30: 11:00:00 via INTRAVENOUS

## 2011-04-30 MED ORDER — HYDROMORPHONE HCL PF 1 MG/ML IJ SOLN
0.2500 mg | INTRAMUSCULAR | Status: DC | PRN
  Administered 2011-04-30 (×2): 0.5 mg via INTRAVENOUS

## 2011-04-30 MED ORDER — PROPOFOL 10 MG/ML IV EMUL
INTRAVENOUS | Status: DC | PRN
  Administered 2011-04-30 (×2): 70 mg via INTRAVENOUS

## 2011-04-30 MED ORDER — COUMADIN BOOK
Freq: Once | Status: AC
  Administered 2011-05-03: 16:00:00
  Filled 2011-04-30: qty 1

## 2011-04-30 MED ORDER — METOPROLOL TARTRATE 50 MG PO TABS
50.0000 mg | ORAL_TABLET | Freq: Two times a day (BID) | ORAL | Status: DC
  Administered 2011-04-30 – 2011-05-07 (×14): 50 mg via ORAL
  Filled 2011-04-30 (×15): qty 1

## 2011-04-30 MED ORDER — OXYCODONE-ACETAMINOPHEN 5-325 MG PO TABS
1.0000 | ORAL_TABLET | ORAL | Status: DC | PRN
  Filled 2011-04-30: qty 1

## 2011-04-30 MED ORDER — METOCLOPRAMIDE HCL 5 MG/ML IJ SOLN
5.0000 mg | Freq: Three times a day (TID) | INTRAMUSCULAR | Status: DC | PRN
  Filled 2011-04-30: qty 2

## 2011-04-30 MED ORDER — PHENYLEPHRINE HCL 10 MG/ML IJ SOLN
INTRAMUSCULAR | Status: DC | PRN
  Administered 2011-04-30: 40 ug via INTRAVENOUS

## 2011-04-30 MED ORDER — CEFAZOLIN SODIUM-DEXTROSE 2-3 GM-% IV SOLR
2.0000 g | Freq: Four times a day (QID) | INTRAVENOUS | Status: AC
  Administered 2011-04-30 – 2011-05-01 (×3): 2 g via INTRAVENOUS
  Filled 2011-04-30 (×4): qty 50

## 2011-04-30 MED ORDER — FERROUS SULFATE 325 (65 FE) MG PO TABS
325.0000 mg | ORAL_TABLET | Freq: Three times a day (TID) | ORAL | Status: DC
  Administered 2011-04-30 – 2011-05-07 (×21): 325 mg via ORAL
  Filled 2011-04-30 (×24): qty 1

## 2011-04-30 MED ORDER — MEPERIDINE HCL 25 MG/ML IJ SOLN: 6.2500 mg | INTRAMUSCULAR | Status: DC | PRN

## 2011-04-30 MED ORDER — CEFAZOLIN SODIUM 1-5 GM-% IV SOLN
INTRAVENOUS | Status: AC
  Filled 2011-04-30: qty 100

## 2011-04-30 MED ORDER — METHOCARBAMOL 100 MG/ML IJ SOLN
500.0000 mg | Freq: Once | INTRAMUSCULAR | Status: AC
  Administered 2011-04-30: 500 mg via INTRAVENOUS
  Filled 2011-04-30: qty 5

## 2011-04-30 MED ORDER — SODIUM CHLORIDE 0.9 % IR SOLN
Status: DC | PRN
  Administered 2011-04-30: 1000 mL

## 2011-04-30 MED ORDER — METOCLOPRAMIDE HCL 10 MG PO TABS: 5.0000 mg | ORAL_TABLET | Freq: Three times a day (TID) | ORAL | Status: DC | PRN

## 2011-04-30 MED ORDER — LISINOPRIL 20 MG PO TABS
20.0000 mg | ORAL_TABLET | Freq: Every day | ORAL | Status: DC
  Administered 2011-05-01 – 2011-05-07 (×7): 20 mg via ORAL
  Filled 2011-04-30 (×9): qty 1

## 2011-04-30 MED ORDER — ONDANSETRON HCL 4 MG/2ML IJ SOLN: 4.0000 mg | Freq: Four times a day (QID) | INTRAMUSCULAR | Status: DC | PRN

## 2011-04-30 MED ORDER — METHOCARBAMOL 100 MG/ML IJ SOLN
500.0000 mg | Freq: Four times a day (QID) | INTRAVENOUS | Status: DC | PRN
  Filled 2011-04-30: qty 5

## 2011-04-30 MED ORDER — MIDAZOLAM HCL 2 MG/2ML IJ SOLN: 0.5000 mg | Freq: Once | INTRAMUSCULAR | Status: DC | PRN

## 2011-04-30 MED ORDER — INSULIN ASPART 100 UNIT/ML ~~LOC~~ SOLN
0.0000 [IU] | Freq: Three times a day (TID) | SUBCUTANEOUS | Status: DC
  Administered 2011-05-01: 2 [IU] via SUBCUTANEOUS
  Administered 2011-05-01: 3 [IU] via SUBCUTANEOUS
  Administered 2011-05-02: 5 [IU] via SUBCUTANEOUS
  Administered 2011-05-03: 2 [IU] via SUBCUTANEOUS
  Administered 2011-05-03: 3 [IU] via SUBCUTANEOUS
  Administered 2011-05-04 – 2011-05-05 (×3): 2 [IU] via SUBCUTANEOUS
  Administered 2011-05-06 – 2011-05-07 (×3): 3 [IU] via SUBCUTANEOUS
  Filled 2011-04-30: qty 3

## 2011-04-30 MED ORDER — DOCUSATE SODIUM 100 MG PO CAPS
100.0000 mg | ORAL_CAPSULE | Freq: Two times a day (BID) | ORAL | Status: DC | PRN
  Administered 2011-05-03: 100 mg via ORAL
  Filled 2011-04-30: qty 1

## 2011-04-30 MED ORDER — MORPHINE SULFATE 2 MG/ML IJ SOLN: 0.0500 mg/kg | INTRAMUSCULAR | Status: DC | PRN

## 2011-04-30 MED ORDER — SODIUM CHLORIDE 0.9 % IV SOLN
INTRAVENOUS | Status: DC
  Administered 2011-04-30: 19:00:00 via INTRAVENOUS

## 2011-04-30 MED ORDER — LEVOTHYROXINE SODIUM 112 MCG PO TABS
112.0000 ug | ORAL_TABLET | Freq: Every day | ORAL | Status: DC
  Administered 2011-05-01 – 2011-05-07 (×7): 112 ug via ORAL
  Filled 2011-04-30 (×8): qty 1

## 2011-04-30 MED ORDER — WARFARIN SODIUM 7.5 MG PO TABS
7.5000 mg | ORAL_TABLET | Freq: Once | ORAL | Status: AC
  Administered 2011-04-30: 7.5 mg via ORAL
  Filled 2011-04-30: qty 1

## 2011-04-30 MED ORDER — ONDANSETRON HCL 4 MG/2ML IJ SOLN
INTRAMUSCULAR | Status: DC | PRN
  Administered 2011-04-30: 4 mg via INTRAVENOUS

## 2011-04-30 MED ORDER — FENTANYL CITRATE 0.05 MG/ML IJ SOLN
INTRAMUSCULAR | Status: DC | PRN
  Administered 2011-04-30 (×2): 50 ug via INTRAVENOUS

## 2011-04-30 MED ORDER — LINAGLIPTIN 5 MG PO TABS
5.0000 mg | ORAL_TABLET | Freq: Every day | ORAL | Status: DC
  Administered 2011-04-30 – 2011-05-07 (×8): 5 mg via ORAL
  Filled 2011-04-30 (×8): qty 1

## 2011-04-30 MED ORDER — HYDROMORPHONE HCL PF 1 MG/ML IJ SOLN: 0.5000 mg | INTRAMUSCULAR | Status: DC | PRN

## 2011-04-30 MED ORDER — WARFARIN VIDEO: Freq: Once | Status: DC

## 2011-04-30 MED ORDER — FENTANYL CITRATE 0.05 MG/ML IJ SOLN: 1.0000 ug/kg | INTRAMUSCULAR | Status: DC | PRN

## 2011-04-30 MED ORDER — GLIPIZIDE 10 MG PO TABS: 10.0000 mg | ORAL_TABLET | Freq: Two times a day (BID) | ORAL | Status: DC

## 2011-04-30 MED ORDER — METHOCARBAMOL 500 MG PO TABS
500.0000 mg | ORAL_TABLET | Freq: Four times a day (QID) | ORAL | Status: DC | PRN
  Filled 2011-04-30: qty 1

## 2011-04-30 MED ORDER — ONDANSETRON HCL 4 MG PO TABS: 4.0000 mg | ORAL_TABLET | Freq: Four times a day (QID) | ORAL | Status: DC | PRN

## 2011-04-30 MED ORDER — METHOCARBAMOL 100 MG/ML IJ SOLN
500.0000 mg | INTRAVENOUS | Status: AC
  Filled 2011-04-30: qty 5

## 2011-04-30 MED ORDER — GLIPIZIDE 10 MG PO TABS
10.0000 mg | ORAL_TABLET | Freq: Two times a day (BID) | ORAL | Status: DC
  Administered 2011-04-30 – 2011-05-07 (×14): 10 mg via ORAL
  Filled 2011-04-30 (×16): qty 1

## 2011-04-30 MED ORDER — HYDROCODONE-ACETAMINOPHEN 5-325 MG PO TABS
1.0000 | ORAL_TABLET | ORAL | Status: DC | PRN
  Administered 2011-04-30 – 2011-05-01 (×2): 1 via ORAL
  Administered 2011-05-01 – 2011-05-07 (×5): 2 via ORAL
  Filled 2011-04-30 (×5): qty 2
  Filled 2011-04-30: qty 1

## 2011-04-30 SURGICAL SUPPLY — 37 items
BANDAGE GAUZE ELAST BULKY 4 IN (GAUZE/BANDAGES/DRESSINGS) ×4 IMPLANT
BLADE SAW SGTL HD 18.5X60.5X1. (BLADE) ×2 IMPLANT
BLADE SURG 10 STRL SS (BLADE) IMPLANT
BNDG COHESIVE 4X5 TAN STRL (GAUZE/BANDAGES/DRESSINGS) ×4 IMPLANT
CLOTH BEACON ORANGE TIMEOUT ST (SAFETY) ×2 IMPLANT
COVER SURGICAL LIGHT HANDLE (MISCELLANEOUS) ×2 IMPLANT
CUFF TOURNIQUET SINGLE 34IN LL (TOURNIQUET CUFF) IMPLANT
CUFF TOURNIQUET SINGLE 44IN (TOURNIQUET CUFF) IMPLANT
DRAPE U-SHAPE 47X51 STRL (DRAPES) ×2 IMPLANT
DRSG ADAPTIC 3X8 NADH LF (GAUZE/BANDAGES/DRESSINGS) ×2 IMPLANT
DURAPREP 26ML APPLICATOR (WOUND CARE) ×2 IMPLANT
ELECT REM PT RETURN 9FT ADLT (ELECTROSURGICAL) ×2
ELECTRODE REM PT RTRN 9FT ADLT (ELECTROSURGICAL) ×1 IMPLANT
GLOVE BIOGEL PI IND STRL 9 (GLOVE) ×1 IMPLANT
GLOVE BIOGEL PI INDICATOR 9 (GLOVE) ×1
GLOVE SURG ORTHO 9.0 STRL STRW (GLOVE) ×2 IMPLANT
GOWN PREVENTION PLUS XLARGE (GOWN DISPOSABLE) ×2 IMPLANT
GOWN SRG XL XLNG 56XLVL 4 (GOWN DISPOSABLE) ×2 IMPLANT
GOWN STRL NON-REIN XL XLG LVL4 (GOWN DISPOSABLE) ×2
KIT BASIN OR (CUSTOM PROCEDURE TRAY) ×2 IMPLANT
KIT ROOM TURNOVER OR (KITS) ×2 IMPLANT
MANIFOLD NEPTUNE II (INSTRUMENTS) ×2 IMPLANT
NS IRRIG 1000ML POUR BTL (IV SOLUTION) ×2 IMPLANT
PACK ORTHO EXTREMITY (CUSTOM PROCEDURE TRAY) ×2 IMPLANT
PAD ARMBOARD 7.5X6 YLW CONV (MISCELLANEOUS) ×4 IMPLANT
PAD CAST 4YDX4 CTTN HI CHSV (CAST SUPPLIES) ×1 IMPLANT
PADDING CAST COTTON 4X4 STRL (CAST SUPPLIES) ×1
SPONGE GAUZE 4X4 12PLY (GAUZE/BANDAGES/DRESSINGS) ×2 IMPLANT
SPONGE LAP 18X18 X RAY DECT (DISPOSABLE) ×2 IMPLANT
STAPLER VISISTAT 35W (STAPLE) ×2 IMPLANT
SUT ETHILON 2 0 PSLX (SUTURE) ×6 IMPLANT
SUT VIC AB 2-0 CTB1 (SUTURE) ×4 IMPLANT
TOWEL OR 17X24 6PK STRL BLUE (TOWEL DISPOSABLE) ×2 IMPLANT
TOWEL OR 17X26 10 PK STRL BLUE (TOWEL DISPOSABLE) ×2 IMPLANT
TUBE CONNECTING 12X1/4 (SUCTIONS) ×2 IMPLANT
WATER STERILE IRR 1000ML POUR (IV SOLUTION) ×2 IMPLANT
YANKAUER SUCT BULB TIP NO VENT (SUCTIONS) ×2 IMPLANT

## 2011-04-30 NOTE — Anesthesia Procedure Notes (Signed)
Procedure Name: LMA Insertion Date/Time: 04/30/2011 11:24 AM Performed by: Leona Singleton A. Preoxygenation: Pre-oxygenation with 100% oxygen Intubation Type: IV induction Ventilation: Mask ventilation without difficulty LMA: LMA inserted LMA Size: 4.0 Tube type: Oral Number of attempts: 2 Placement Confirmation: positive ETCO2 and breath sounds checked- equal and bilateral Tube secured with: Tape Dental Injury: Teeth and Oropharynx as per pre-operative assessment

## 2011-04-30 NOTE — H&P (Signed)
[name] is an 76 y.o. female.   Chief Complaint: Infection osteomyelitis right forefoot HPI: Patient is a 76 year old woman with diabetes status post previous foot salvage surgery who presents at this time with ulceration osteomyelitis of the right forefoot she has failed conservative wound treatment failed wounds to failed foot salvage surgery  Past Medical History  Diagnosis Date  . Hypertension   . Hyperlipidemia   . Chronic venous insufficiency   . Osteomyelitis     s/p Rt 2nd toe and left 5 toes  amputation in 1/12  by Dr. Lajoyce Corners  . Diabetes mellitus   . Anemia     b12 def, iron def, follow at cancer center, gets B12 and  another injection there. Could not remeber the name. Dr. Arline Asp is her  cancer doctor  . Thyroid disease     hypothyroidism h/o hyperthyroidism s/p ablation/ectomy  . Asthma   . Dysrhythmia     atrial fibb  . Shortness of breath   . Blood transfusion     two or more yrs ago  . Hypothyroidism     thyroid removed 4 or more yrs ago  . Atrial fibrillation   . Chronic renal insufficiency     Past Surgical History  Procedure Date  . Eye surgery     cat ext ou  . Appendectomy     teenager    Family History  Problem Relation Age of Onset  . Anemia Mother   . HIV Brother   . Cancer Brother    Social History:  reports that she has never smoked. She does not have any smokeless tobacco history on file. She reports that she does not drink alcohol or use illicit drugs.  Allergies: No Known Allergies  No current facility-administered medications on file as of .   Medications Prior to Admission  Medication Sig Dispense Refill  . docusate sodium (COLACE) 100 MG capsule Take 1 capsule (100 mg total) by mouth 2 (two) times daily as needed.  20 capsule  1  . glipiZIDE (GLUCOTROL) 10 MG tablet Take 1 tablet (10 mg total) by mouth 2 (two) times daily before a meal.  60 tablet  11  . Lancets 30G MISC Use to check blood sugars as directed  100 each  3  .  lisinopril (PRINIVIL,ZESTRIL) 40 MG tablet Take 0.5 tablets (20 mg total) by mouth daily.  30 tablet  0  . metoprolol tartrate (LOPRESSOR) 50 MG tablet Take 1 tablet (50 mg total) by mouth 2 (two) times daily.  60 tablet  11  . pantoprazole (PROTONIX) 40 MG tablet TAKE ONE TABLET BY MOUTH ONE TIME DAILY  30 tablet  0  . pravastatin (PRAVACHOL) 20 MG tablet Take 1 tablet (20 mg total) by mouth daily.  30 tablet  11  . sitaGLIPtin (JANUVIA) 100 MG tablet Take 1 tablet (100 mg total) by mouth daily.  30 tablet  11    Results for orders placed during the hospital encounter of 04/28/11 (from the past 48 hour(s))  CBC     Status: Abnormal   Collection Time   04/28/11  2:16 PM      Component Value Range Comment   WBC 8.2  4.0 - 10.5 (K/uL)    RBC 3.85 (*) 3.87 - 5.11 (MIL/uL)    Hemoglobin 10.5 (*) 12.0 - 15.0 (g/dL)    HCT 57.8 (*) 46.9 - 46.0 (%)    MCV 87.0  78.0 - 100.0 (fL)    MCH 27.3  26.0 - 34.0 (pg)    MCHC 31.3  30.0 - 36.0 (g/dL)    RDW 04.5 (*) 40.9 - 15.5 (%)    Platelets 181  150 - 400 (K/uL)   COMPREHENSIVE METABOLIC PANEL     Status: Abnormal   Collection Time   04/28/11  2:16 PM      Component Value Range Comment   Sodium 140  135 - 145 (mEq/L)    Potassium 3.6  3.5 - 5.1 (mEq/L)    Chloride 104  96 - 112 (mEq/L)    CO2 26  19 - 32 (mEq/L)    Glucose, Bld 150 (*) 70 - 99 (mg/dL)    BUN 30 (*) 6 - 23 (mg/dL)    Creatinine, Ser 8.11 (*) 0.50 - 1.10 (mg/dL)    Calcium 9.1  8.4 - 10.5 (mg/dL)    Total Protein 7.4  6.0 - 8.3 (g/dL)    Albumin 3.1 (*) 3.5 - 5.2 (g/dL)    AST 10  0 - 37 (U/L)    ALT 6  0 - 35 (U/L)    Alkaline Phosphatase 90  39 - 117 (U/L)    Total Bilirubin 0.4  0.3 - 1.2 (mg/dL)    GFR calc non Af Amer 30 (*) >90 (mL/min)    GFR calc Af Amer 34 (*) >90 (mL/min)   PROTIME-INR     Status: Abnormal   Collection Time   04/28/11  2:16 PM      Component Value Range Comment   Prothrombin Time 15.5 (*) 11.6 - 15.2 (seconds)    INR 1.20  0.00 - 1.49      SURGICAL PCR SCREEN     Status: Normal   Collection Time   04/28/11  2:18 PM      Component Value Range Comment   MRSA, PCR NEGATIVE  NEGATIVE     Staphylococcus aureus NEGATIVE  NEGATIVE      Chest 2 View  04/28/2011  *RADIOLOGY REPORT*  Clinical Data: Preop for amputation of the right mid foot, diabetes  CHEST - 2 VIEW  Comparison: Chest x-ray of 02/13/2010  Findings: The lungs are slightly hyperaerated.  Mild basilar scarring is noted.  No active infiltrate or effusion is seen. Cardiomegaly is stable.  There are degenerative changes throughout the thoracic spine with a mild thoracic scoliosis present.  IMPRESSION:  1.  No active lung disease.  Probable mild scarring at the lung bases. 2.  Stable moderate cardiomegaly.  Original Report Authenticated By: Juline Patch, M.D.    Review of Systems  All other systems reviewed and are negative.    There were no vitals taken for this visit. Physical Exam  On examination patient does have palpable pulses she is a Wagner grade 3 ulcer over the forefoot radiograph shows osteomyelitis she is status post foot salvage surgery with a digital amputations. Assessment/Plan Right forefoot osteomyelitis with Wagner grade 3 ulceration with diabetic insensate neuropathy. Plan will plan for midfoot amputation. Risks and benefits were discussed including infection neurovascular injury nonhealing of the wound need for higher level amputation. Patient states he understands and wished to proceed at this time.  [name] V 04/30/2011, 6:18 AM

## 2011-04-30 NOTE — Transfer of Care (Signed)
Immediate Anesthesia Transfer of Care Note  Patient: [name]  Procedure(s) Performed:  AMPUTATION FOOT - Right Midfoot Amputation  Patient Location: PACU  Anesthesia Type: General  Level of Consciousness: awake, alert , oriented and patient cooperative  Airway & Oxygen Therapy: Patient Spontanous Breathing and Patient connected to nasal cannula oxygen  Post-op Assessment: Report given to PACU RN, Post -op Vital signs reviewed and stable and Patient moving all extremities X 4  Post vital signs: Reviewed and stable  Complications: No apparent anesthesia complications

## 2011-04-30 NOTE — Preoperative (Signed)
Beta Blockers   Reason not to administer Beta Blockers:Not Applicable 

## 2011-04-30 NOTE — Op Note (Signed)
OPERATIVE REPORT  DATE OF SURGERY: 04/30/2011  PATIENT:  [name],  76 y.o. female  PRE-OPERATIVE DIAGNOSIS:  Osteomyelitis Right Foot  POST-OPERATIVE DIAGNOSIS:  Osteomyelitis Right Foot  PROCEDURE:  Procedure(s): AMPUTATION FOOT transmetatarsal amputation  SURGEON:  Surgeon(s): Nadara Mustard, MD  ANESTHESIA:   general  EBL:  Minimal ML  SPECIMEN:  Source of Specimen:  Right forefoot  TOURNIQUET:  * No tourniquets in log *  PROCEDURE DETAILS: Patient is a 76 year old woman with diabetes status post foot salvage surgeries and has had progressive ulceration abscess and osteomyelitis of the right forefoot she has failed conservative treatment including antibiotics wound care pressure unloading and presents at this time for transmetatarsal amputation. Risks and benefits were discussed including infection neurovascular injury persistent pain nonhealing of the wound need for additional surgery patient states he understands and wished to proceed this time. Description of procedure patient was brought to OR room tendon underwent a general anesthetic. After adequate levels of anesthesia were obtained patient's right lower extremity was prepped using DuraPrep and draped into a sterile field. A fishmouth incision was made through the forefoot this was carried down a transmetatarsal amputation was performed the bone edges were beveled. The wound was irrigated with normal saline hemostasis was obtained the incision was closed using 2-0 nylon. The wound is covered with Adaptic orthopedic sponges AB dressing Kerlix web roll and Coban patient was also placed in a compressive wrap due to the swelling of the right lower extremity with venous stasis insufficiency discharged to PACU in stable condition  PLAN OF CARE: Admit to inpatient   PATIENT DISPOSITION:  PACU - hemodynamically stable.   Nadara Mustard, MD 04/30/2011 11:52 AM

## 2011-04-30 NOTE — Progress Notes (Signed)
ANTICOAGULATION CONSULT NOTE - Initial Consult  Pharmacy Consult for Coumadin Indication: VTE prophylaxis  No Known Allergies  Patient Measurements:   Adjusted Body Weight:   Vital Signs: Temp: 97.5 F (36.4 C) (12/12 1609) Temp src: Oral (12/12 1609) BP: 154/95 mmHg (12/12 1609) Pulse Rate: 85  (12/12 1609)  Labs:  Basename 04/28/11 1416  HGB 10.5*  HCT 33.5*  PLT 181  APTT --  LABPROT 15.5*  INR 1.20  HEPARINUNFRC --  CREATININE 1.65*  CKTOTAL --  CKMB --  TROPONINI --   The CrCl is unknown because both a height and weight (above a minimum accepted value) are required for this calculation.  Medical History: Past Medical History  Diagnosis Date  . Hypertension   . Hyperlipidemia   . Chronic venous insufficiency   . Osteomyelitis     s/p Rt 2nd toe and left 5 toes  amputation in 1/12  by Dr. Lajoyce Corners  . Diabetes mellitus   . Anemia     b12 def, iron def, follow at cancer center, gets B12 and  another injection there. Could not remeber the name. Dr. Arline Asp is her  cancer doctor  . Thyroid disease     hypothyroidism h/o hyperthyroidism s/p ablation/ectomy  . Asthma   . Dysrhythmia     atrial fibb  . Shortness of breath   . Blood transfusion     two or more yrs ago  . Hypothyroidism     thyroid removed 4 or more yrs ago  . Atrial fibrillation   . Chronic renal insufficiency     Medications:  Prescriptions prior to admission  Medication Sig Dispense Refill  . doxycycline (DORYX) 100 MG DR capsule Take 100 mg by mouth 2 (two) times daily.        Marland Kitchen glipiZIDE (GLUCOTROL) 10 MG tablet Take 1 tablet (10 mg total) by mouth 2 (two) times daily before a meal.  60 tablet  11  . levothyroxine (SYNTHROID, LEVOTHROID) 112 MCG tablet TAKE ONE (1) TABLET(S) BY MOUTH DAILY  30 tablet  0  . lisinopril (PRINIVIL,ZESTRIL) 40 MG tablet Take 0.5 tablets (20 mg total) by mouth daily.  30 tablet  0  . metoprolol tartrate (LOPRESSOR) 50 MG tablet Take 1 tablet (50 mg total) by  mouth 2 (two) times daily.  60 tablet  11  . pantoprazole (PROTONIX) 40 MG tablet TAKE ONE TABLET BY MOUTH ONE TIME DAILY  30 tablet  0  . pravastatin (PRAVACHOL) 20 MG tablet Take 1 tablet (20 mg total) by mouth daily.  30 tablet  11  . sitaGLIPtin (JANUVIA) 100 MG tablet Take 1 tablet (100 mg total) by mouth daily.  30 tablet  11  . docusate sodium (COLACE) 100 MG capsule Take 1 capsule (100 mg total) by mouth 2 (two) times daily as needed.  20 capsule  1  . glipiZIDE (GLUCOTROL) 10 MG tablet Take 1 tablet (10 mg total) by mouth 2 (two) times daily.  60 tablet  3  . Lancets 30G MISC Use to check blood sugars as directed  100 each  3    Assessment: Patient is a 76 y.o F s/p right midfoot amputation secondary to osteomyelitis.  To start Coumadin for VTE prophylaxis.  Baseline INR on 12/10 was 1.20  Goal of Therapy:  INR 2-3   Plan:  1) Coumadin 7.5 mg PO x1 today  Covey Baller P 04/30/2011,5:08 PM

## 2011-04-30 NOTE — Anesthesia Postprocedure Evaluation (Signed)
  Anesthesia Post-op Note  Patient: [name]  Procedure(s) Performed:  AMPUTATION FOOT - Right Midfoot Amputation  Patient Location: PACU  Anesthesia Type: General  Level of Consciousness: awake  Airway and Oxygen Therapy: Patient Spontanous Breathing  Post-op Pain: mild  Post-op Assessment: Post-op Vital signs reviewed  Post-op Vital Signs: stable  Complications: No apparent anesthesia complications

## 2011-05-01 ENCOUNTER — Ambulatory Visit: Payer: PRIVATE HEALTH INSURANCE

## 2011-05-01 ENCOUNTER — Other Ambulatory Visit: Payer: PRIVATE HEALTH INSURANCE | Admitting: Lab

## 2011-05-01 ENCOUNTER — Encounter (HOSPITAL_COMMUNITY): Payer: Self-pay | Admitting: Orthopedic Surgery

## 2011-05-01 LAB — GLUCOSE, CAPILLARY
Glucose-Capillary: 151 mg/dL — ABNORMAL HIGH (ref 70–99)
Glucose-Capillary: 67 mg/dL — ABNORMAL LOW (ref 70–99)
Glucose-Capillary: 138 mg/dL — ABNORMAL HIGH (ref 70–99)

## 2011-05-01 LAB — PROTIME-INR: INR: 1.45 (ref 0.00–1.49)

## 2011-05-01 MED ORDER — WARFARIN SODIUM 5 MG PO TABS
5.0000 mg | ORAL_TABLET | Freq: Once | ORAL | Status: AC
  Administered 2011-05-01: 5 mg via ORAL
  Filled 2011-05-01: qty 1

## 2011-05-01 NOTE — Progress Notes (Signed)
Physical Therapy Evaluation Patient Details Name: [name] MRN: [id number] DOB: 03/12/1935 Today's Date: 05/01/2011  Problem List:  Patient Active Problem List  Diagnoses  . HYPOTHYROIDISM  . DIABETES-TYPE 2  . HYPERLIPIDEMIA  . ANEMIA, VITAMIN B12 DEFICIENCY  . HYPERTENSION  . VENOUS INSUFFICIENCY  . RENAL INSUFFICIENCY  . DYSCHROMIA, UNSPECIFIED  . OSTEOMYELITIS, ACUTE, ANKLE/FOOT  . OSTEOMYELITIS  . EDEMA LEG  . CHEST PAIN  . STATUS, OTHER TOE(S) AMPUTATION  . Atrial fibrillation  . Anemia associated with chronic renal failure  . MGUS (monoclonal gammopathy of unknown significance)    Past Medical History:  Past Medical History  Diagnosis Date  . Hypertension   . Hyperlipidemia   . Chronic venous insufficiency   . Osteomyelitis     s/p Rt 2nd toe and left 5 toes  amputation in 1/12  by Dr. Lajoyce Corners  . Diabetes mellitus   . Anemia     b12 def, iron def, follow at cancer center, gets B12 and  another injection there. Could not remeber the name. Dr. Arline Asp is her  cancer doctor  . Thyroid disease     hypothyroidism h/o hyperthyroidism s/p ablation/ectomy  . Asthma   . Dysrhythmia     atrial fibb  . Shortness of breath   . Blood transfusion     two or more yrs ago  . Hypothyroidism     thyroid removed 4 or more yrs ago  . Atrial fibrillation   . Chronic renal insufficiency    Past Surgical History:  Past Surgical History  Procedure Date  . Eye surgery     cat ext ou  . Appendectomy     teenager    PT Assessment/Plan/Recommendation PT Assessment Clinical Impression Statement: Pt. with decreased mobility secondary to right forefoot amputation and previous left forefoot amputation a year ago.  Pt. able to stand for placement of bed pan with therapist foot under RLE for monitoring of TDWB, but unable to pivot secondary to inability to fully weight bear through LLE without a shoe.  Pt. did sit at EOB for ten minutes and performed weight shifting well for  doffing of underwear in preparation for using the bed pan.  Will continue to work on increasing functional mobility to decrease burden of care at next venue.  Pt. wanting to go to CIR, however explained to pt. that she may not qualify as 24 hr supervision is not available at home and that SNF may be a better option, to which pt. is agreeable to "do what I need to do."  Will attempt transfers  to [facility] and chair and ambulation at next session. PT Recommendation/Assessment: Patient will need skilled PT in the acute care venue PT Problem List: Decreased strength;Decreased mobility;Decreased knowledge of precautions;Pain;Decreased balance;Decreased activity tolerance Barriers to Discharge: None PT Therapy Diagnosis : Difficulty walking;Acute pain PT Plan PT Frequency: Min 3X/week PT Treatment/Interventions: DME instruction;Gait training;Functional mobility training;Therapeutic activities;Patient/family education PT Recommendation Follow Up Recommendations: Skilled nursing facility Equipment Recommended: Defer to next venue PT Goals  Acute Rehab PT Goals PT Goal Formulation: With patient Time For Goal Achievement: 7 days Pt will go Supine/Side to Sit: with HOB 0 degrees;with modified independence;with rail PT Goal: Supine/Side to Sit - Progress: Other (comment) Pt will go Sit to Supine/Side: with modified independence;with rail;with HOB 0 degrees PT Goal: Sit to Supine/Side - Progress: Other (comment) Pt will go Sit to Stand: with min assist PT Goal: Sit to Stand - Progress: Other (comment) Pt will go  Stand to Sit: with min assist PT Goal: Stand to Sit - Progress: Other (comment) Pt will Transfer Bed to Chair/Chair to Bed: with mod assist PT Transfer Goal: Bed to Chair/Chair to Bed - Progress: Other (comment) Pt will Ambulate: 16 - 50 feet;with max assist;with rolling walker PT Goal: Ambulate - Progress: Other (comment)  PT Evaluation Precautions/Restrictions  Precautions Precautions:  Fall Required Braces or Orthoses: Yes Other Brace/Splint: Post-op shoe however not present, RN made aware for order. Restrictions Weight Bearing Restrictions: Yes RLE Weight Bearing: Touchdown weight bearing Prior Functioning  Home Living Lives With: Alone Type of Home: Apartment Home Layout: One level Home Access: Level entry Bathroom Shower/Tub: Engineer, manufacturing systems: Standard Bathroom Accessibility: Yes How Accessible: Accessible via walker Home Adaptive Equipment: Raised toilet seat with rails;Walker - rolling;Shower chair with back Prior Function Level of Independence: Needs assistance with ADLs;Needs assistance with gait;Requires assistive device for independence;Needs assistance with homemaking Bath: Minimal Meal Prep: Moderate (Is able to cook, but daughter usually brings food over) Light Housekeeping: Total Driving: No Vocation: Retired Producer, television/film/video: Awake/alert Overall Cognitive Status: Appears within functional limits for tasks assessed Orientation Level: Oriented X4 Sensation/Coordination   Extremity Assessment   Mobility (including Balance) Bed Mobility Bed Mobility: Yes Rolling Right: 6: Modified independent (Device/Increase time);With rail Rolling Left: 6: Modified independent (Device/Increase time);With rail Supine to Sit: 6: Modified independent (Device/Increase time);With rails;HOB elevated (Comment degrees) (10 degrees) Sitting - Scoot to Edge of Bed: 6: Modified independent (Device/Increase time) Sit to Supine - Right: 6: Modified independent (Device/Increase time);With rail;HOB elevated (comment degrees) (25 degrees) Transfers Transfers: Yes Sit to Stand: With upper extremity assist;From bed;2: Max assist;From elevated surface Sit to Stand Details (indicate cue type and reason): Assist secondary to inability to fully weight bear on LLE and NWB on RLE.  Cues for hand placement. Two trials.  Stand to Sit: 3: Mod  assist;With upper extremity assist;To bed;To elevated surface Stand to Sit Details: Mod assist for control of descent.  Two trials.  Ambulation/Gait Ambulation/Gait: No    Exercise    End of Session PT - End of Session Equipment Utilized During Treatment: Gait belt Activity Tolerance: Patient tolerated treatment well Patient left: in bed;with call bell in reach Nurse Communication: Mobility status for transfers;Weight bearing status General Behavior During Session: [facility] for tasks performed Cognition: [facility] for tasks performed  Laney Pastor, SPT  05/01/2011, 12:04 PM

## 2011-05-01 NOTE — Progress Notes (Signed)
ANTICOAGULATION CONSULT NOTE - Follow-up Pharmacy Consult for Coumadin Indication: VTE prophylaxis  No Known Allergies  Patient Measurements:   Adjusted Body Weight:   Vital Signs: Temp: 97.8 F (36.6 C) (12/13 1019) Temp src: Oral (12/13 1019) BP: 130/60 mmHg (12/13 1019) Pulse Rate: 93  (12/13 1019)  Labs:  Basename 05/01/11 0655 04/28/11 1416  HGB -- 10.5*  HCT -- 33.5*  PLT -- 181  APTT -- --  LABPROT 17.9* 15.5*  INR 1.45 1.20  HEPARINUNFRC -- --  CREATININE -- 1.65*  CKTOTAL -- --  CKMB -- --  TROPONINI -- --   The CrCl is unknown because both a height and weight (above a minimum accepted value) are required for this calculation.  Medical History: Past Medical History  Diagnosis Date  . Hypertension   . Hyperlipidemia   . Chronic venous insufficiency   . Osteomyelitis     s/p Rt 2nd toe and left 5 toes  amputation in 1/12  by Dr. Lajoyce Corners  . Diabetes mellitus   . Anemia     b12 def, iron def, follow at cancer center, gets B12 and  another injection there. Could not remeber the name. Dr. Arline Asp is her  cancer doctor  . Thyroid disease     hypothyroidism h/o hyperthyroidism s/p ablation/ectomy  . Asthma   . Dysrhythmia     atrial fibb  . Shortness of breath   . Blood transfusion     two or more yrs ago  . Hypothyroidism     thyroid removed 4 or more yrs ago  . Atrial fibrillation   . Chronic renal insufficiency     Medications:  Prescriptions prior to admission  Medication Sig Dispense Refill  . doxycycline (DORYX) 100 MG DR capsule Take 100 mg by mouth 2 (two) times daily.        Marland Kitchen glipiZIDE (GLUCOTROL) 10 MG tablet Take 1 tablet (10 mg total) by mouth 2 (two) times daily before a meal.  60 tablet  11  . levothyroxine (SYNTHROID, LEVOTHROID) 112 MCG tablet TAKE ONE (1) TABLET(S) BY MOUTH DAILY  30 tablet  0  . lisinopril (PRINIVIL,ZESTRIL) 40 MG tablet Take 0.5 tablets (20 mg total) by mouth daily.  30 tablet  0  . metoprolol tartrate (LOPRESSOR)  50 MG tablet Take 1 tablet (50 mg total) by mouth 2 (two) times daily.  60 tablet  11  . pantoprazole (PROTONIX) 40 MG tablet TAKE ONE TABLET BY MOUTH ONE TIME DAILY  30 tablet  0  . pravastatin (PRAVACHOL) 20 MG tablet Take 1 tablet (20 mg total) by mouth daily.  30 tablet  11  . sitaGLIPtin (JANUVIA) 100 MG tablet Take 1 tablet (100 mg total) by mouth daily.  30 tablet  11  . docusate sodium (COLACE) 100 MG capsule Take 1 capsule (100 mg total) by mouth 2 (two) times daily as needed.  20 capsule  1  . glipiZIDE (GLUCOTROL) 10 MG tablet Take 1 tablet (10 mg total) by mouth 2 (two) times daily.  60 tablet  3  . Lancets 30G MISC Use to check blood sugars as directed  100 each  3    Assessment: Patient is a 76 y.o F  POD #1 s/p right midfoot amputation secondary to osteomyelitis.  On  Coumadin for VTE prophylaxis.  Baseline INR on 12/10 was 1.20.  Today INR = 1.45. No bleeding reported.   Goal of Therapy:  INR 2-3   Plan:  1) Coumadin 5 mg PO  x1 today 2) INR daily.  Arman Filter 05/01/2011,1:38 PM

## 2011-05-01 NOTE — Progress Notes (Signed)
Subjective: 1 Day Post-Op Procedure(s) (LRB): AMPUTATION FOOT (Right) .    Objective: Vital signs in last 24 hours: Temp:  [97 F (36.1 C)-98.4 F (36.9 C)] 98 F (36.7 C) (12/13 0500) Pulse Rate:  [52-124] 52  (12/13 0500) Resp:  [7-44] 18  (12/13 0500) BP: (139-174)/(71-98) 154/80 mmHg (12/13 0500) SpO2:  [79 %-100 %] 100 % (12/13 0500)  Intake/Output from previous day: 12/12 0701 - 12/13 0700 In: 1384 [I.V.:1384] Out: 3250 [Urine:2950; Blood:300] Intake/Output this shift: Total I/O In: 584 [I.V.:584] Out: 1150 [Urine:1150]   Basename 04/28/11 1416  HGB 10.5*    Basename 04/28/11 1416  WBC 8.2  RBC 3.85*  HCT 33.5*  PLT 181    Basename 04/28/11 1416  NA 140  K 3.6  CL 104  CO2 26  BUN 30*  CREATININE 1.65*  GLUCOSE 150*  CALCIUM 9.1    Basename 04/28/11 1416  LABPT --  INR 1.20    Dressing clean and dry.  Assessment/Plan: 1 Day Post-Op Procedure(s) (LRB): AMPUTATION FOOT (Right) Patient and her family requested consideration for inpatient rehabilitation. I have also requested outpatient rehabilitation. I am unsure if patient would be able to progress to being independent with her ADLs after 2 weeks of inpatient rehabilitation.  Sareen Randon V 05/01/2011, 6:37 AM

## 2011-05-01 NOTE — Progress Notes (Signed)
Seen and agree with SPT note Case Vassell Tabor, PT [phone]  

## 2011-05-02 DIAGNOSIS — I70269 Atherosclerosis of native arteries of extremities with gangrene, unspecified extremity: Secondary | ICD-10-CM

## 2011-05-02 DIAGNOSIS — S98919A Complete traumatic amputation of unspecified foot, level unspecified, initial encounter: Secondary | ICD-10-CM

## 2011-05-02 DIAGNOSIS — R5381 Other malaise: Secondary | ICD-10-CM

## 2011-05-02 LAB — PROTIME-INR
INR: 2.35 — ABNORMAL HIGH (ref 0.00–1.49)
Prothrombin Time: 26.1 seconds — ABNORMAL HIGH (ref 11.6–15.2)

## 2011-05-02 LAB — GLUCOSE, CAPILLARY
Glucose-Capillary: 122 mg/dL — ABNORMAL HIGH (ref 70–99)
Glucose-Capillary: 136 mg/dL — ABNORMAL HIGH (ref 70–99)

## 2011-05-02 MED ORDER — WARFARIN VIDEO: Freq: Once | Status: DC

## 2011-05-02 MED ORDER — WARFARIN VIDEO
Freq: Once | Status: AC
  Administered 2011-05-02: 12:00:00

## 2011-05-02 NOTE — Progress Notes (Signed)
Subjective: 2 Days Post-Op Procedure(s) (LRB): AMPUTATION FOOT (Right)     Objective: Vital signs in last 24 hours: Temp:  [97.8 F (36.6 C)-98.4 F (36.9 C)] 98.4 F (36.9 C) (12/13 2156) Pulse Rate:  [88-102] 102  (12/13 2210) Resp:  [18-20] 18  (12/13 2156) BP: (126-160)/(58-88) 126/58 mmHg (12/13 2210) SpO2:  [20 %-100 %] 97 % (12/13 2156)  Intake/Output from previous day: 12/13 0701 - 12/14 0700 In: 654 [P.O.:390; I.V.:264] Out: 1250 [Urine:1250] Intake/Output this shift: Total I/O In: 264 [I.V.:264] Out: 300 [Urine:300]  No results found for this basename: HGB:5 in the last 72 hours No results found for this basename: WBC:2,RBC:2,HCT:2,PLT:2 in the last 72 hours No results found for this basename: NA:2,K:2,CL:2,CO2:2,BUN:2,CREATININE:2,GLUCOSE:2,CALCIUM:2 in the last 72 hours  Basename 05/01/11 0655  LABPT --  INR 1.45    Neurologically intact  Assessment/Plan: 2 Days Post-Op Procedure(s) (LRB): AMPUTATION FOOT (Right) Discharge to SNF Patient is slow to progress with physical therapy will also consult for discharge to skilled nursing. Patient may still be a candidate for inpatient rehabilitation.  Marylynne Keelin V 05/02/2011, 5:52 AM

## 2011-05-02 NOTE — Progress Notes (Signed)
ANTICOAGULATION CONSULT NOTE - Follow-up Pharmacy Consult for Coumadin Indication: VTE prophylaxis  No Known Allergies  Patient Measurements: Height: 5\' 5"  (165.1 cm) Weight: 199 lb 11.8 oz (90.6 kg) IBW/kg (Calculated) : 57    Vital Signs: Temp: 98 F (36.7 C) (12/14 0608) Temp src: Oral (12/14 0608) BP: 136/61 mmHg (12/14 0608) Pulse Rate: 88  (12/14 0608)  Labs:  Basename 05/02/11 0625 05/01/11 0655  HGB -- --  HCT -- --  PLT -- --  APTT -- --  LABPROT 26.1* 17.9*  INR 2.35* 1.45  HEPARINUNFRC -- --  CREATININE -- --  CKTOTAL -- --  CKMB -- --  TROPONINI -- --   Estimated Creatinine Clearance: 33.2 ml/min (by C-G formula based on Cr of 1.65).  Medical History: Past Medical History  Diagnosis Date  . Hypertension   . Hyperlipidemia   . Chronic venous insufficiency   . Osteomyelitis     s/p Rt 2nd toe and left 5 toes  amputation in 1/12  by Dr. Lajoyce Corners  . Diabetes mellitus   . Anemia     b12 def, iron def, follow at cancer center, gets B12 and  another injection there. Could not remeber the name. Dr. Arline Asp is her  cancer doctor  . Thyroid disease     hypothyroidism h/o hyperthyroidism s/p ablation/ectomy  . Asthma   . Dysrhythmia     atrial fibb  . Shortness of breath   . Blood transfusion     two or more yrs ago  . Hypothyroidism     thyroid removed 4 or more yrs ago  . Atrial fibrillation   . Chronic renal insufficiency     Medications:  Prescriptions prior to admission  Medication Sig Dispense Refill  . doxycycline (DORYX) 100 MG DR capsule Take 100 mg by mouth 2 (two) times daily.        Marland Kitchen glipiZIDE (GLUCOTROL) 10 MG tablet Take 1 tablet (10 mg total) by mouth 2 (two) times daily before a meal.  60 tablet  11  . levothyroxine (SYNTHROID, LEVOTHROID) 112 MCG tablet TAKE ONE (1) TABLET(S) BY MOUTH DAILY  30 tablet  0  . lisinopril (PRINIVIL,ZESTRIL) 40 MG tablet Take 0.5 tablets (20 mg total) by mouth daily.  30 tablet  0  . metoprolol tartrate  (LOPRESSOR) 50 MG tablet Take 1 tablet (50 mg total) by mouth 2 (two) times daily.  60 tablet  11  . pantoprazole (PROTONIX) 40 MG tablet TAKE ONE TABLET BY MOUTH ONE TIME DAILY  30 tablet  0  . pravastatin (PRAVACHOL) 20 MG tablet Take 1 tablet (20 mg total) by mouth daily.  30 tablet  11  . sitaGLIPtin (JANUVIA) 100 MG tablet Take 1 tablet (100 mg total) by mouth daily.  30 tablet  11  . docusate sodium (COLACE) 100 MG capsule Take 1 capsule (100 mg total) by mouth 2 (two) times daily as needed.  20 capsule  1  . glipiZIDE (GLUCOTROL) 10 MG tablet Take 1 tablet (10 mg total) by mouth 2 (two) times daily.  60 tablet  3  . Lancets 30G MISC Use to check blood sugars as directed  100 each  3    Assessment: Patient is a 76 y.o F  POD #2 s/p right midfoot amputation secondary to osteomyelitis.  On  Coumadin for VTE prophylaxis.  INR increased to 2.45 today after 2 days of coumadin. No bleeding reported.   Goal of Therapy:  INR 2-3   Plan:  No  Coumadin today.  Follow up INR tomorrow. 2) INR daily.  Noah Delaine Prescott 05/02/2011,10:00 AM

## 2011-05-02 NOTE — Consult Note (Signed)
Physical Medicine and Rehabilitation Consult Reason for Consult:rigth forefoot amputation Referring Phsyician: Dr [name] is an 76 y.o. female.   HPI: Ear old right-handed black female with history of left forefoot amputation as well as multiple right toe amputations. Admitted December 12 with osteomyelitis of the right forefoot and no change with conservative care. She underwent right foot transmetatarsal amputation December 12 per Dr. Lajoyce Mosley. Placed on Coumadin for deep vein thrombosis prophylaxis. Postop pain management with Dilaudid. Therapy evaluation completed December 13 required modified independence for sit to supine max assist for sit to stand ambulation yet to be tested as well as occupational therapy assessment pending. Physical medicine rehabilitation consulted for consideration inpatient rehabilitation services Review of Systems  Constitutional: Negative for fever and malaise/fatigue.  Eyes: Negative for double vision.  Respiratory: Negative for shortness of breath.   Cardiovascular: Negative for chest pain.  Gastrointestinal: Positive for constipation. Negative for nausea.  Genitourinary: Negative for dysuria.  Musculoskeletal: Positive for joint pain.  Skin: Negative.   Neurological: Negative for dizziness and headaches.  Psychiatric/Behavioral: Negative.    Past Medical History  Diagnosis Date  . Hypertension   . Hyperlipidemia   . Chronic venous insufficiency   . Osteomyelitis     s/p Rt 2nd toe and left 5 toes  amputation in 1/12  by Dr. Lajoyce Mosley  . Diabetes mellitus   . Anemia     b12 def, iron def, follow at cancer center, gets B12 and  another injection there. Could not remeber the name. Dr. Arline Mosley is her  cancer doctor  . Thyroid disease     hypothyroidism h/o hyperthyroidism s/p ablation/ectomy  . Asthma   . Dysrhythmia     atrial fibb  . Shortness of breath   . Blood transfusion     two or more yrs ago  . Hypothyroidism     thyroid removed 4 or more  yrs ago  . Atrial fibrillation   . Chronic renal insufficiency    Past Surgical History  Procedure Date  . Eye surgery     cat ext ou  . Appendectomy     teenager  . Amputation 04/30/2011    Procedure: AMPUTATION FOOT;  Surgeon: Stacy Mustard, MD;  Location: [facility] OR;  Service: Orthopedics;  Laterality: Right;  Right Midfoot Amputation   Family History  Problem Relation Age of Onset  . Anemia Mother   . HIV Brother   . Cancer Brother    Social History:  reports that she has never smoked. She does not have any smokeless tobacco history on file. She reports that she does not drink alcohol or use illicit drugs. Allergies: No Known Allergies Medications Prior to Admission  Medication Dose Route Frequency Provider Last Rate Last Dose  . 0.9 %  sodium chloride infusion   Intravenous Continuous Stacy Mustard, MD 20 mL/hr at 04/30/11 1924    . ceFAZolin (ANCEF) IVPB 2 g/50 mL premix  2 g Intravenous Q6H Stacy Mustard, MD   2 g at 05/01/11 0527  . coumadin book   Does not apply Once [name], PHARMD      . docusate sodium (COLACE) capsule 100 mg  100 mg Oral BID PRN Stacy Mustard, MD      . ferrous sulfate tablet 325 mg  325 mg Oral TID PC Stacy Mustard, MD   325 mg at 05/02/11 1236  . glipiZIDE (GLUCOTROL) tablet 10 mg  10 mg Oral BID AC [name]  Stacy Corners, MD   10 mg at 05/02/11 0836  . HYDROcodone-acetaminophen (NORCO) 5-325 MG per tablet 1-2 tablet  1-2 tablet Oral Q4H PRN Stacy Mustard, MD   2 tablet at 05/02/11 0443  . HYDROmorphone (DILAUDID) injection 0.5-1 mg  0.5-1 mg Intravenous Q2H PRN Stacy Mustard, MD      . insulin aspart (novoLOG) injection 0-15 Units  0-15 Units Subcutaneous TID WC Stacy Mustard, MD   5 Units at 05/02/11 1235  . levothyroxine (SYNTHROID, LEVOTHROID) tablet 112 mcg  112 mcg Oral QAC breakfast Stacy Mustard, MD   112 mcg at 05/02/11 [phone]  . linagliptin (TRADJENTA) tablet 5 mg  5 mg Oral Daily Stacy Mustard, MD   5 mg at 05/02/11 1003  . lisinopril (PRINIVIL,ZESTRIL)  tablet 20 mg  20 mg Oral Daily Stacy Mustard, MD   20 mg at 05/02/11 1003  . methocarbamol (ROBAXIN) tablet 500 mg  500 mg Oral Q6H PRN Stacy Mustard, MD       Or  . methocarbamol (ROBAXIN) 500 mg in dextrose 5 % 50 mL IVPB  500 mg Intravenous Q6H PRN Stacy Mustard, MD      . methocarbamol (ROBAXIN) 500 mg in dextrose 5 % 50 mL IVPB  500 mg Intravenous Once Stacy Mustard, MD   500 mg at 04/30/11 1304  . methocarbamol (ROBAXIN) 500 mg in dextrose 5 % 50 mL IVPB  500 mg Intravenous To PACU [name], PHARMD      . metoCLOPramide (REGLAN) tablet 5-10 mg  5-10 mg Oral Q8H PRN Stacy Mustard, MD       Or  . metoCLOPramide (REGLAN) injection 5-10 mg  5-10 mg Intravenous Q8H PRN Stacy Mustard, MD      . metoprolol (LOPRESSOR) tablet 50 mg  50 mg Oral BID Stacy Mustard, MD   50 mg at 05/02/11 1003  . ondansetron (ZOFRAN) tablet 4 mg  4 mg Oral Q6H PRN Stacy Mustard, MD       Or  . ondansetron [facility]) injection 4 mg  4 mg Intravenous Q6H PRN Stacy Mustard, MD      . oxyCODONE-acetaminophen (PERCOCET) 5-325 MG per tablet 1-2 tablet  1-2 tablet Oral Q4H PRN Stacy Mustard, MD      . simvastatin (ZOCOR) tablet 5 mg  5 mg Oral q1800 Stacy Mustard, MD   5 mg at 05/01/11 1750  . warfarin (COUMADIN) tablet 5 mg  5 mg Oral ONCE-1800 [name], RPH   5 mg at 05/01/11 1750  . warfarin (COUMADIN) tablet 7.5 mg  7.5 mg Oral ONCE-1800 [name], PHARMD   7.5 mg at 04/30/11 1834  . warfarin (COUMADIN) video   Does not apply Once [name], [facility]      . DISCONTD: fentaNYL (SUBLIMAZE) injection 1-3 mcg/kg  1-3 mcg/kg Intravenous PRN Stacy Petit, MD      . DISCONTD: glipiZIDE (GLUCOTROL) tablet 10 mg  10 mg Oral BID Stacy Mustard, MD      . DISCONTD: HYDROmorphone (DILAUDID) injection 0.25-0.5 mg  0.25-0.5 mg Intravenous Q5 min PRN Stacy Petit, MD   0.5 mg at 04/30/11 1439  . DISCONTD: lactated ringers infusion   Intravenous Continuous Stacy Petit, MD      . DISCONTD: meperidine  (DEMEROL) injection 6.25-12.5 mg  6.25-12.5 mg Intravenous Q5 min PRN Stacy Petit, MD      . DISCONTD: midazolam (VERSED) injection 0.5-2 mg  0.5-2 mg Intravenous Once PRN Stacy Petit, MD      . DISCONTD: morphine 2 MG/ML injection 0.05 mg/kg  0.05 mg/kg Intravenous Q10 min PRN Stacy Petit, MD      . DISCONTD: promethazine (PHENERGAN) injection 6.25-12.5 mg  6.25-12.5 mg Intravenous Q15 min PRN Stacy Petit, MD      . DISCONTD: sodium chloride irrigation 0.9 %    PRN Stacy Mustard, MD   1,000 mL at 04/30/11 1121  . DISCONTD: warfarin (COUMADIN) video   Does not apply Once [name], PHARMD      . DISCONTD: warfarin (COUMADIN) video   Does not apply Once [name], [facility]       Medications Prior to Admission  Medication Sig Dispense Refill  . glipiZIDE (GLUCOTROL) 10 MG tablet Take 1 tablet (10 mg total) by mouth 2 (two) times daily before a meal.  60 tablet  11  . lisinopril (PRINIVIL,ZESTRIL) 40 MG tablet Take 0.5 tablets (20 mg total) by mouth daily.  30 tablet  0  . metoprolol tartrate (LOPRESSOR) 50 MG tablet Take 1 tablet (50 mg total) by mouth 2 (two) times daily.  60 tablet  11  . pantoprazole (PROTONIX) 40 MG tablet TAKE ONE TABLET BY MOUTH ONE TIME DAILY  30 tablet  0  . pravastatin (PRAVACHOL) 20 MG tablet Take 1 tablet (20 mg total) by mouth daily.  30 tablet  11  . sitaGLIPtin (JANUVIA) 100 MG tablet Take 1 tablet (100 mg total) by mouth daily.  30 tablet  11  . docusate sodium (COLACE) 100 MG capsule Take 1 capsule (100 mg total) by mouth 2 (two) times daily as needed.  20 capsule  1  . Lancets 30G MISC Use to check blood sugars as directed  100 each  3    Home: Home Living Lives With: Alone Type of Home: Apartment Home Layout: One level Home Access: Level entry Bathroom Shower/Tub: Engineer, manufacturing systems: Standard Bathroom Accessibility: Yes How Accessible: Accessible via walker Home Adaptive Equipment: Raised toilet seat with  rails;Walker - rolling;Shower chair with back  Functional History: Prior Function Level of Independence: Needs assistance with ADLs;Needs assistance with gait;Requires assistive device for independence;Needs assistance with homemaking Bath: Minimal Meal Prep: Moderate (Is able to cook, but daughter usually brings food over) Light Housekeeping: Total Driving: No Vocation: Retired Functional Status:  Mobility: Bed Mobility Bed Mobility: Yes Rolling Right: 6: Modified independent (Device/Increase time);With rail Rolling Left: 6: Modified independent (Device/Increase time);With rail Supine to Sit: 6: Modified independent (Device/Increase time);With rails;HOB elevated (Comment degrees) (10 degrees) Sitting - Scoot to Edge of Bed: 6: Modified independent (Device/Increase time) Sit to Supine - Right: 6: Modified independent (Device/Increase time);With rail;HOB elevated (comment degrees) (25 degrees) Transfers Transfers: Yes Sit to Stand: With upper extremity assist;From bed;2: Max assist;From elevated surface Sit to Stand Details (indicate cue type and reason): Assist secondary to inability to fully weight bear on LLE and NWB on RLE.  Cues for hand placement. Two trials.  Stand to Sit: 3: Mod assist;With upper extremity assist;To bed;To elevated surface Stand to Sit Details: Mod assist for control of descent.  Two trials.  Ambulation/Gait Ambulation/Gait: No    ADL:    Cognition: Cognition Arousal/Alertness: Awake/alert Orientation Level: Oriented X4 Cognition Arousal/Alertness: Awake/alert Overall Cognitive Status: Appears within functional limits for tasks assessed Orientation Level: Oriented X4  Blood pressure 136/61, pulse 88, temperature 98 F (36.7 C), temperature source Oral, resp. rate 18, height 5\' 5"  (1.651 m),  weight 90.6 kg (199 lb 11.8 oz), SpO2 95.00%. Physical Exam  Constitutional: She is oriented to person, place, and time. She appears well-developed.  HENT:    Head: Normocephalic.  Neck: Normal range of motion. Neck supple. No thyromegaly present.  Cardiovascular: Normal rate and regular rhythm.   Pulmonary/Chest: Effort normal. She has no wheezes.  Abdominal: She exhibits no distension. There is no tenderness.  Musculoskeletal: She exhibits no edema.  Neurological: She is alert and oriented to person, place, and time.  Skin:       Left forefoot amputation site well healed.Right amputation site dressed.  Psychiatric: Her behavior is normal.    Results for orders placed during the hospital encounter of 04/30/11 (from the past 24 hour(s))  GLUCOSE, CAPILLARY     Status: Abnormal   Collection Time   05/01/11  4:39 PM      Component Value Range   Glucose-Capillary 138 (*) 70 - 99 (mg/dL)   Comment 1 Documented in Chart     Comment 2 Notify RN    GLUCOSE, CAPILLARY     Status: Abnormal   Collection Time   05/01/11  9:55 PM      Component Value Range   Glucose-Capillary 144 (*) 70 - 99 (mg/dL)  PROTIME-INR     Status: Abnormal   Collection Time   05/02/11  6:25 AM      Component Value Range   Prothrombin Time 26.1 (*) 11.6 - 15.2 (seconds)   INR 2.35 (*) 0.00 - 1.49   GLUCOSE, CAPILLARY     Status: Normal   Collection Time   05/02/11  7:43 AM      Component Value Range   Glucose-Capillary 71  70 - 99 (mg/dL)  GLUCOSE, CAPILLARY     Status: Abnormal   Collection Time   05/02/11 12:07 PM      Component Value Range   Glucose-Capillary 219 (*) 70 - 99 (mg/dL)   No results found.  Assessment/Plan: Diagnosis: Right TMA 1. Does the need for close, 24 hr/day medical supervision in concert with the patient's rehab needs make it unreasonable for this patient to be served in a less intensive setting? Potentially 2. Co-Morbidities requiring supervision/potential complications: Diabetes anemia hypertension atrial fibrillation 3. Due to bladder management, bowel management, safety, skin/wound care, disease management, medication  administration, pain management and patient education, does the patient require 24 hr/day rehab nursing? Potentially 4. Does the patient require coordinated care of a physician, rehab nurse, PT (1-2 hrs/day, 5 days/week) and OT (1-2 hrs/day, 5 days/week) to address physical and functional deficits in the context of the above medical diagnosis(es)? Potentially Addressing deficits in the following areas: balance, bathing, endurance, grooming, locomotion, strength, toileting and transferring 5. Can the patient actively participate in an intensive therapy program of at least 3 hrs of therapy per day at least 5 days per week? Yes 6. The potential for patient to make measurable gains while on inpatient rehab is good 7. Anticipated functional outcomes upon discharge from inpatients are supervision PT, supervision OT 8. Estimated rehab length of stay to reach the above functional goals is: 1 week to 10 days 9. Does the patient have adequate social supports to accommodate these discharge functional goals? No 10. Anticipated D/C setting: Home 11. Anticipated post D/C treatments: HH therapy 12. Overall Rehab/Functional Prognosis: excellent  RECOMMENDATIONS: This patient's condition is appropriate for continued rehabilitative care in the following setting: SNF vs CIR Patient has agreed to participate in recommended program. Potentially Note that  insurance prior authorization may be required for reimbursement for recommended care.  Comment: Patient prefers to come to inpatient rehabilitation however I am not sure she has the supports available to provide assistance after an inpatient rehabilitation stay. Skilled nursing facility probably is the best option for her. Rehabilitation case manager in followup regarding social supports. Also medical necessity is borderline here.    05/02/2011

## 2011-05-02 NOTE — Progress Notes (Signed)
Patient referred for possible SNF placement. Spoke with patient and daughter Fleet Contras- after talking with Dr. Lajoyce Corners- they are hoping patient can be admitted to inpatient rehab. Order obtained and sent for CIR referral; spoke to Genie , [employer] regarding patient. If unable to go to CIR- patient would opt to go to [facility]. She has been a resident there before. Daughter states that patient will have 24 hour care at home at discharge.  Will monitor. [place] number in chart.  Fl2 placed on shadow chart for MD signature.  Darylene Price, BSW, 05/02/2011 4:42 PM

## 2011-05-03 LAB — GLUCOSE, CAPILLARY: Glucose-Capillary: 83 mg/dL (ref 70–99)

## 2011-05-03 MED ORDER — WARFARIN SODIUM 5 MG PO TABS
5.0000 mg | ORAL_TABLET | Freq: Once | ORAL | Status: AC
  Administered 2011-05-03: 5 mg via ORAL
  Filled 2011-05-03: qty 1

## 2011-05-03 NOTE — Progress Notes (Signed)
Patient ID: [name], female   DOB: [birth year], 76 y.o.   MRN: [id number] Doing well, but slow progress with mobility.  Inpatient Rehab consult I believe feels that ST-SNF is more appropriate.  She is comfortable. AF/VVV Right midfoot amp dressing C/D/I  Plan: Will likely need ST-SNF

## 2011-05-03 NOTE — Progress Notes (Signed)
ANTICOAGULATION CONSULT NOTE - Follow Up Consult  Pharmacy Consult for Coumadin Indication: VTE prophylaxis  Assessment: 76 yo F POD #3 for right midfoot amputation secondary to osteomyelitis.  On coumadin for VTE prophylaxis, now with therapeutic INR (2.27). No bleeding issues noted.  Goal of Therapy:  INR 2-3   Plan:  Coumadin 5mg  PO x1 today. Check INR in AM.  Ival Bible 05/03/2011,7:35 AM  No Known Allergies  Patient Measurements: Height: 5\' 5"  (165.1 cm) Weight: 199 lb 11.8 oz (90.6 kg) IBW/kg (Calculated) : 57   Vital Signs: Temp: 98.3 F (36.8 C) (12/15 0623) Temp src: Oral (12/15 0623) BP: 172/79 mmHg (12/15 0623) Pulse Rate: 86  (12/15 0623)  Labs:  Basename 05/03/11 0500 05/02/11 0625 05/01/11 0655  HGB -- -- --  HCT -- -- --  PLT -- -- --  APTT -- -- --  LABPROT 25.4* 26.1* 17.9*  INR 2.27* 2.35* 1.45  HEPARINUNFRC -- -- --  CREATININE -- -- --  CKTOTAL -- -- --  CKMB -- -- --  TROPONINI -- -- --   Estimated Creatinine Clearance: 33.2 ml/min (by C-G formula based on Cr of 1.65).   Medications:  Scheduled:    . coumadin book   Does not apply Once  . ferrous sulfate  325 mg Oral TID PC  . glipiZIDE  10 mg Oral BID AC  . insulin aspart  0-15 Units Subcutaneous TID WC  . levothyroxine  112 mcg Oral QAC breakfast  . linagliptin  5 mg Oral Daily  . lisinopril  20 mg Oral Daily  . metoprolol tartrate  50 mg Oral BID  . simvastatin  5 mg Oral q1800  . warfarin   Does not apply Once  . DISCONTD: warfarin   Does not apply Once  . DISCONTD: warfarin   Does not apply Once

## 2011-05-04 LAB — GLUCOSE, CAPILLARY
Glucose-Capillary: 126 mg/dL — ABNORMAL HIGH (ref 70–99)
Glucose-Capillary: 65 mg/dL — ABNORMAL LOW (ref 70–99)
Glucose-Capillary: 138 mg/dL — ABNORMAL HIGH (ref 70–99)

## 2011-05-04 LAB — PROTIME-INR: INR: 2.02 — ABNORMAL HIGH (ref 0.00–1.49)

## 2011-05-04 MED ORDER — WARFARIN SODIUM 5 MG PO TABS
5.0000 mg | ORAL_TABLET | Freq: Once | ORAL | Status: AC
  Administered 2011-05-04: 5 mg via ORAL
  Filled 2011-05-04: qty 1

## 2011-05-04 NOTE — Progress Notes (Signed)
Patient ID: [name], female   DOB: 12/12/1934, 76 y.o.   MRN: [id number] No acute changes. Mild pain Right foot dressing intact  Plan: ST-SNF placement

## 2011-05-04 NOTE — Progress Notes (Signed)
ANTICOAGULATION CONSULT NOTE - Follow Up Consult  Pharmacy Consult for Coumadin Indication: VTE prophylaxis  Assessment: 76 yo F POD #4 for right midfoot amputation secondary to osteomyelitis. On coumadin for VTE prophylaxis, continues with therapeutic INR (2.02). Decline in INR likely due to holding two doses.  No bleeding issues noted.  Goal of Therapy:  INR 2-3   Plan:  Coumadin 5mg  PO x1 today. Check INR in AM.  [name] 05/04/2011,7:18 AM  No Known Allergies  Patient Measurements: Height: 5\' 5"  (165.1 cm) Weight: 199 lb 11.8 oz (90.6 kg) IBW/kg (Calculated) : 57   Vital Signs: Temp: 97.3 F (36.3 C) (12/16 0647) Temp src: Oral (12/15 2227) BP: 147/86 mmHg (12/16 0647) Pulse Rate: 101  (12/16 0647)  Labs:  Basename 05/04/11 0535 05/03/11 0500 05/02/11 0625  HGB -- -- --  HCT -- -- --  PLT -- -- --  APTT -- -- --  LABPROT 23.2* 25.4* 26.1*  INR 2.02* 2.27* 2.35*  HEPARINUNFRC -- -- --  CREATININE -- -- --  CKTOTAL -- -- --  CKMB -- -- --  TROPONINI -- -- --   Estimated Creatinine Clearance: 33.2 ml/min (by C-G formula based on Cr of 1.65).   Medications:  Scheduled:    . coumadin book   Does not apply Once  . ferrous sulfate  325 mg Oral TID PC  . glipiZIDE  10 mg Oral BID AC  . insulin aspart  0-15 Units Subcutaneous TID WC  . levothyroxine  112 mcg Oral QAC breakfast  . linagliptin  5 mg Oral Daily  . lisinopril  20 mg Oral Daily  . metoprolol tartrate  50 mg Oral BID  . simvastatin  5 mg Oral q1800  . warfarin  5 mg Oral ONCE-1800

## 2011-05-05 LAB — GLUCOSE, CAPILLARY
Glucose-Capillary: 183 mg/dL — ABNORMAL HIGH (ref 70–99)
Glucose-Capillary: 69 mg/dL — ABNORMAL LOW (ref 70–99)
Glucose-Capillary: 140 mg/dL — ABNORMAL HIGH (ref 70–99)
Glucose-Capillary: 107 mg/dL — ABNORMAL HIGH (ref 70–99)
Glucose-Capillary: 169 mg/dL — ABNORMAL HIGH (ref 70–99)

## 2011-05-05 LAB — PROTIME-INR
INR: 3.54 — ABNORMAL HIGH (ref 0.00–1.49)
Prothrombin Time: 36 seconds — ABNORMAL HIGH (ref 11.6–15.2)

## 2011-05-05 NOTE — Progress Notes (Signed)
Patient ID: [name], female   DOB: 1935-05-03, 76 y.o.   MRN: [id number] Possible CIR if not then SNF, FL-2 signed

## 2011-05-05 NOTE — Progress Notes (Signed)
CBG: 69 @ 0754  Treatment: 15 GM carbohydrate snack, breakfast tray arrived  Symptoms: None  Follow-up CBG: Time:0818 CBG Result: 93  Possible Reasons for Event: Unknown  Comments/MD notified: Will continue to monitor     [place], Sharyon Medicus

## 2011-05-05 NOTE — Progress Notes (Signed)
Physical Therapy Treatment Patient Details Name: [name] MRN: [id number] DOB: 1935/03/25 Today's Date: 05/05/2011  PT Assessment/Plan  PT - Assessment/Plan Comments on Treatment Session: Pt. did well during today's treatment session, however is still unable to maintain touchdown weightbearing of RLE in standing in order to attempt a stand pivot.  Pt. able to transfer to chair with sliding board and able to verbalize correct sequencing of transfer with prompting in order to assist  nursing with transfer back to bed.  Pt. understanding that she needs to build LLE strength in order to be able to maintain her RLE TDWB status.  PT Plan: Discharge plan remains appropriate;Frequency remains appropriate Follow Up Recommendations: Skilled nursing facility Equipment Recommended: Defer to next venue PT Goals  Acute Rehab PT Goals PT Goal: Supine/Side to Sit - Progress: Progressing toward goal PT Goal: Sit to Supine/Side - Progress: Other (comment) (Not addressed today. ) PT Goal: Sit to Stand - Progress: Progressing toward goal PT Goal: Stand to Sit - Progress: Progressing toward goal Pt will Transfer Bed to Chair/Chair to Bed: with max assist PT Transfer Goal: Bed to Chair/Chair to Bed - Progress: Revised (modified due to lack of progress/goal met) PT Goal: Ambulate - Progress: Other (comment) (Not addressed today. ) Additional Goals Additional Goal #1: Pt. will perform lateral transfer to and from chair with sliding board with modified independence.  PT Goal: Additional Goal #1 - Progress: Progressing toward goal  PT Treatment Precautions/Restrictions  Precautions Precautions: Fall Required Braces or Orthoses: Yes Other Brace/Splint: right post-op shoe Restrictions Weight Bearing Restrictions: Yes RLE Weight Bearing: Touchdown weight bearing Mobility (including Balance) Bed Mobility Supine to Sit: 6: Modified independent (Device/Increase time);HOB elevated (Comment degrees) (15  degrees) Sitting - Scoot to Edge of Bed: 6: Modified independent (Device/Increase time) Transfers Transfers: Yes Sit to Stand: 2: Max assist;With upper extremity assist;From bed Sit to Stand Details (indicate cue type and reason): Cues for safe hand placement.  Assist secondary to weakness and TDWB on RLE, patient unable to maintain TDWB in standing so unable to attempt stand-pivot to chair.  Stand to Sit: 3: Mod assist;To bed Stand to Sit Details: Mod assist for control of descent, pt. with tendency to plop.  Cues for safe hand placement.  Lateral/Scoot Transfers: Other (comment);4: Min assist (with sliding board. ) Lateral/Scoot Transfer Details (indicate cue type and reason): Cues for safe use of sliding board and hand placement.  Pt. requiring min assist for set-up of transfer and  to place board under right hip in preparation to transfer to right to chair.  Ambulation/Gait Ambulation/Gait: No    Exercise    End of Session PT - End of Session Equipment Utilized During Treatment: Gait belt;Sliding board;Other (comment) (post-op shoe) Activity Tolerance: Patient tolerated treatment well Patient left: in chair;with call bell in reach Nurse Communication: Mobility status for transfers General Behavior During Session: [facility] for tasks performed Cognition: [facility] for tasks performed  Laney Pastor, SPT  05/05/2011, 12:57 PM

## 2011-05-05 NOTE — Progress Notes (Signed)
Seen and agree with SPT note Suraj Ramdass Tabor, PT [phone]  

## 2011-05-05 NOTE — Progress Notes (Signed)
Inpatient Diabetes Program Recommendations  AACE/ADA: New Consensus Statement on Inpatient Glycemic Control (2009)  Target Ranges:  Prepandial:   less than 140 mg/dL      Peak postprandial:   less than 180 mg/dL (1-2 hours)      Critically ill patients:  140 - 180 mg/dL   Reason for Visit: Hypoglycemia 12/16 and 12/17  Inpatient Diabetes Program Recommendations Oral Agents: Consider adjusting Glucotrol dose due to hypoglycemia

## 2011-05-05 NOTE — Progress Notes (Signed)
ANTICOAGULATION CONSULT NOTE - Follow Up Consult  Pharmacy Consult for Coumadin Indication: VTE prophylaxis  No Known Allergies  Patient Measurements: Height: 5\' 5"  (165.1 cm) Weight: 199 lb 11.8 oz (90.6 kg) IBW/kg (Calculated) : 57   Vital Signs: Temp: 98.4 F (36.9 C) (12/17 0500) Temp src: Oral (12/17 0500) BP: 125/66 mmHg (12/17 0500) Pulse Rate: 84  (12/17 0500)  Labs:  Basename 05/05/11 0600 05/04/11 0535 05/03/11 0500  HGB -- -- --  HCT -- -- --  PLT -- -- --  APTT -- -- --  LABPROT 36.0* 23.2* 25.4*  INR 3.54* 2.02* 2.27*  HEPARINUNFRC -- -- --  CREATININE -- -- --  CKTOTAL -- -- --  CKMB -- -- --  TROPONINI -- -- --   Estimated Creatinine Clearance: 33.2 ml/min (by C-G formula based on Cr of 1.65).   Assessment: 76 yo F POD #5 for right midfoot amputation secondary to osteomyelitis. On coumadin for VTE prophylaxis. INR increased up to 3.54 (supratherapeutic) today (from 2.02).  No bleeding issues noted but last CBC done 12/10.  Goal of Therapy:  INR 2-3   Plan:  No coumadin today. Check INR and CBC in AM.  Christoper Fabian, PharmD, BCPS 05/04/2011,7:18 AM

## 2011-05-05 NOTE — Progress Notes (Signed)
Rehab admissions - Evaluated for possible admission.  Spoke with patient and I called her daughter.  I also called insurance carrier.  Insurance carrier has denied inpatient rehab.  Options will be SNF for continued therapies.  I have informed the daughter and the charge nurse on unit.  Daughter would like to pursue [facility] where patient has been previously.  Pager [phone]

## 2011-05-06 LAB — GLUCOSE, CAPILLARY
Glucose-Capillary: 99 mg/dL (ref 70–99)
Glucose-Capillary: 158 mg/dL — ABNORMAL HIGH (ref 70–99)
Glucose-Capillary: 166 mg/dL — ABNORMAL HIGH (ref 70–99)

## 2011-05-06 LAB — CBC
Hemoglobin: 9.3 g/dL — ABNORMAL LOW (ref 12.0–15.0)
Platelets: 172 10*3/uL (ref 150–400)
RBC: 3.51 MIL/uL — ABNORMAL LOW (ref 3.87–5.11)
WBC: 7 10*3/uL (ref 4.0–10.5)

## 2011-05-06 LAB — PROTIME-INR: Prothrombin Time: 31 seconds — ABNORMAL HIGH (ref 11.6–15.2)

## 2011-05-06 MED ORDER — WARFARIN SODIUM 2.5 MG PO TABS: 2.5000 mg | ORAL_TABLET | Freq: Once | ORAL | Status: DC

## 2011-05-06 MED ORDER — WARFARIN SODIUM 1 MG PO TABS
1.0000 mg | ORAL_TABLET | Freq: Once | ORAL | Status: AC
  Administered 2011-05-06: 1 mg via ORAL
  Filled 2011-05-06: qty 1

## 2011-05-06 NOTE — Progress Notes (Signed)
ANTICOAGULATION CONSULT NOTE - Follow Up Consult  Pharmacy Consult for Coumadin Indication: VTE prophylaxis  No Known Allergies  Patient Measurements: Height: 5\' 5"  (165.1 cm) Weight: 199 lb 11.8 oz (90.6 kg) IBW/kg (Calculated) : 57   Vital Signs: Temp: 97.6 F (36.4 C) (12/18 0522) Temp src: Oral (12/18 0522) BP: 153/98 mmHg (12/18 0522) Pulse Rate: 85  (12/18 0522)  Labs:  Basename 05/06/11 0615 05/05/11 0600 05/04/11 0535  HGB 9.3* -- --  HCT 30.2* -- --  PLT 172 -- --  APTT -- -- --  LABPROT 31.0* 36.0* 23.2*  INR 2.93* 3.54* 2.02*  HEPARINUNFRC -- -- --  CREATININE -- -- --  CKTOTAL -- -- --  CKMB -- -- --  TROPONINI -- -- --   Estimated Creatinine Clearance: 33.2 ml/min (by C-G formula based on Cr of 1.65).   Assessment: 76 yo F POD #5 for right midfoot amputation secondary to osteomyelitis. On coumadin for VTE prophylaxis. INR therapeutic 2.93 (decreased from 3.54 yesterday after dose held last night.  No bleeding issues noted.  Goal of Therapy:  INR 2-3   Plan:  Coumadin 1mg  po today.  Check INR and CBC in AM.  Christoper Fabian, PharmD, BCPS 05/04/2011,7:18 AM

## 2011-05-06 NOTE — Progress Notes (Signed)
Bed offers recieved; [facility] bed tomorrow for patient. This is patient/daughters choice. Discussed with Dr. Lajoyce Corners- plan d/c to [facility] tomorrow.  Notified Alona Bene- Charge- [phone] of above. Darylene Price, BSW, 05/06/2011 11:31 AM

## 2011-05-06 NOTE — Progress Notes (Signed)
Patient ID: [name], female   DOB: [birth year], 75 y.o.   MRN: [id number] Patient states that her insurance would not cover inpatient rehabilitation. Plan for evaluation for discharge to skilled nursing facility possible discharged today or tomorrow.

## 2011-05-07 LAB — PROTIME-INR: INR: 2.58 — ABNORMAL HIGH (ref 0.00–1.49)

## 2011-05-07 LAB — GLUCOSE, CAPILLARY: Glucose-Capillary: 83 mg/dL (ref 70–99)

## 2011-05-07 MED ORDER — OXYCODONE-ACETAMINOPHEN 5-325 MG PO TABS: 1.0000 | ORAL_TABLET | ORAL | Status: AC | PRN

## 2011-05-07 MED ORDER — HYDROCODONE-ACETAMINOPHEN 5-500 MG PO TABS: 1.0000 | ORAL_TABLET | Freq: Four times a day (QID) | ORAL | Status: AC | PRN

## 2011-05-07 NOTE — Progress Notes (Signed)
OK per MD for d/c today to [facility] for short term SNF via EMS.  Ok per patient, daughter, RN and SNF.   Darylene Price, BSW, 05/07/2011 12:10 PM

## 2011-05-07 NOTE — Discharge Summary (Signed)
Physician Discharge Summary  Patient ID: [name] MRN: [id number] DOB/AGE: 74/25/1936 74 y.o.  Admit date: 04/30/2011 Discharge date: 05/07/2011  Admission Diagnoses: Right forefoot gangrene  Discharge Diagnoses: Right forefoot gangrene Active Problems:  * No active hospital problems. *    Discharged Condition: stable  Hospital Course: Patient's hospital course was essentially unremarkable she was admitted on 1212 and underwent a right midfoot amputation. Postoperatively patient progressed slowly with physical therapy it was determined that she would require short-term skilled nursing and patient was discharged to short-term skilled nursing in stable condition on 1219  Consults: none  Significant Diagnostic Studies: labs: Routine labs were obtained during her hospital stay.  Treatments: surgery: Right midfoot amputation  Discharge Exam: Blood pressure 142/83, pulse 94, temperature 97.5 F (36.4 C), temperature source Oral, resp. rate 20, height 5\' 5"  (1.651 m), weight 90.6 kg (199 lb 11.8 oz), SpO2 100.00%. Incision/Wound: incision clean and dry at time of discharge  Disposition: Home or Self Care  Discharge Orders    Future Appointments: Provider: Department: Dept Phone: Center:   05/15/2011 9:45 AM Leighton Ruff Womack [facility] [phone] None   05/15/2011 10:15 AM [facility] [phone] None   05/29/2011 9:45 AM Leighton Ruff Womack [facility] [phone] None   05/29/2011 10:15 AM [facility] In[facility] [phone] None   06/12/2011 10:15 AM Leighton Ruff Womack [facility] [phone] None   06/12/2011 10:45 AM [facility] [phone] None   06/26/2011 9:30 AM Leighton Ruff Womack [facility] [phone] None   06/26/2011 10:00 AM [facility] [phone] None   07/04/2011 10:00 AM [facility] [phone] None   07/10/2011 9:30 AM Leighton Ruff Womack  [facility] [phone] None   07/10/2011 10:00 AM [facility] [phone] None   07/24/2011 9:30 AM Leighton Ruff Womack [facility] [phone] None   07/24/2011 10:00 AM [facility] [phone] None   08/01/2011 12:30 PM Jarrett Soho Hickerson [facility] [phone] None   08/01/2011 1:00 PM Samul Dada, MD [facility] [phone] None   08/07/2011 10:00 AM Dava Najjar Elie Goody [facility] [phone] None   08/07/2011 10:30 AM [facility] [phone] None     Medication List  As of 05/07/2011  6:15 AM   START taking these medications         HYDROcodone-acetaminophen 5-500 MG per tablet   Commonly known as: VICODIN   Take 1 tablet by mouth every 6 (six) hours as needed for pain.      oxyCODONE-acetaminophen 5-325 MG per tablet   Commonly known as: PERCOCET   Take 1 tablet by mouth every 4 (four) hours as needed for pain.         CONTINUE taking these medications         docusate sodium 100 MG capsule   Commonly known as: COLACE   Take 1 capsule (100 mg total) by mouth 2 (two) times daily as needed.      doxycycline 100 MG DR capsule   Commonly known as: DORYX      * glipiZIDE 10 MG tablet   Commonly known as: GLUCOTROL   Take 1 tablet (10 mg total) by mouth 2 (two) times daily before a meal.      Lancets 30G Misc   Use to check blood sugars as directed      levothyroxine 112 MCG tablet   Commonly known as: SYNTHROID,  LEVOTHROID   TAKE ONE (1) TABLET(S) BY MOUTH DAILY      lisinopril 40 MG tablet   Commonly known as: PRINIVIL,ZESTRIL   Take 0.5 tablets (20 mg total) by mouth daily.      metoprolol 50 MG tablet   Commonly known as: LOPRESSOR   Take 1 tablet (50 mg total) by mouth 2 (two) times daily.      pantoprazole 40 MG tablet   Commonly known as: PROTONIX   TAKE ONE TABLET BY MOUTH ONE TIME DAILY      pravastatin 20 MG tablet   Commonly known as: PRAVACHOL   Take  1 tablet (20 mg total) by mouth daily.      sitaGLIPtin 100 MG tablet   Commonly known as: JANUVIA   Take 1 tablet (100 mg total) by mouth daily.     * Notice: This list has 1 medication(s) that are the same as other medications prescribed for you. Read the directions carefully, and ask your doctor or other care provider to review them with you.       ASK your doctor about these medications         * glipiZIDE 10 MG tablet   Commonly known as: GLUCOTROL   Take 1 tablet (10 mg total) by mouth 2 (two) times daily.     * Notice: This list has 1 medication(s) that are the same as other medications prescribed for you. Read the directions carefully, and ask your doctor or other care provider to review them with you.        Where to get your medications    These are the prescriptions that you need to pick up.   You may get these medications from any pharmacy.         HYDROcodone-acetaminophen 5-500 MG per tablet   oxyCODONE-acetaminophen 5-325 MG per tablet           Follow-up Information    Follow up with Pasha Broad V, MD in 2 weeks.   Contact information:   [address] [place] [address] [phone]          Signed: Nadara Mustard 05/07/2011, 6:15 AM

## 2011-05-15 ENCOUNTER — Ambulatory Visit: Payer: PRIVATE HEALTH INSURANCE

## 2011-05-15 ENCOUNTER — Other Ambulatory Visit: Payer: PRIVATE HEALTH INSURANCE | Admitting: Lab

## 2011-05-29 ENCOUNTER — Ambulatory Visit: Payer: PRIVATE HEALTH INSURANCE

## 2011-05-29 ENCOUNTER — Other Ambulatory Visit: Payer: PRIVATE HEALTH INSURANCE | Admitting: Lab

## 2011-06-12 ENCOUNTER — Other Ambulatory Visit: Payer: PRIVATE HEALTH INSURANCE | Admitting: Lab

## 2011-06-12 ENCOUNTER — Ambulatory Visit: Payer: PRIVATE HEALTH INSURANCE

## 2011-06-24 ENCOUNTER — Telehealth: Payer: Self-pay | Admitting: *Deleted

## 2011-06-24 NOTE — Telephone Encounter (Signed)
Home health to check PT/INR this Thursday or Friday. Will adjust dose after that. Continue current dose for now. Will need an appointment with Chancy Milroy as well. On Coumadin for Aifb

## 2011-06-24 NOTE — Telephone Encounter (Signed)
Call from Diane, RN assessment nurse with [facility]. # [phone] She did PT/INR at today's visit , results:  PT 18.1  And  INR 1.5 Pt came home on Friday from rehab.  She not get Rx for coumadin filled until yesterday.  She was without coumadin for 3 days. Her dose of coumadin is 2 mg  Mon, Wed and Fri. And coumadin 2.5 mg on tue, thur, sat and Sunday.  Hx: rt foot amputation F/U on 2/11  Please advise when you want next INR and any changes in dose

## 2011-06-24 NOTE — Telephone Encounter (Signed)
[facility] informed and will call with results of INR Pt scheduled for appointment with Dr Alexandria Lodge on Monday 2/11

## 2011-06-25 ENCOUNTER — Telehealth: Payer: Self-pay | Admitting: *Deleted

## 2011-06-25 NOTE — Telephone Encounter (Signed)
[name]

## 2011-06-26 ENCOUNTER — Ambulatory Visit: Payer: PRIVATE HEALTH INSURANCE

## 2011-06-26 ENCOUNTER — Other Ambulatory Visit: Payer: PRIVATE HEALTH INSURANCE

## 2011-06-26 ENCOUNTER — Telehealth: Payer: Self-pay | Admitting: *Deleted

## 2011-06-26 NOTE — Telephone Encounter (Signed)
[facility] calls and states she saw pt today, she did a PT & INR : PT = 15.9, INR = 1.3 HHN states pt told her she was not taking any coumadin at this time, there was no coumadin that nurse could see, she states she did have all her other meds.  i have tried to reach her daughter to confirm her appt mon and to clarify if pt is taking any coumadin to her knowledge, i left a message for a return call

## 2011-06-26 NOTE — Telephone Encounter (Signed)
Reviewed appt and confirmed w/ pt

## 2011-06-30 ENCOUNTER — Encounter: Payer: PRIVATE HEALTH INSURANCE | Admitting: Internal Medicine

## 2011-06-30 ENCOUNTER — Ambulatory Visit: Payer: PRIVATE HEALTH INSURANCE

## 2011-07-04 ENCOUNTER — Ambulatory Visit: Payer: PRIVATE HEALTH INSURANCE

## 2011-07-07 ENCOUNTER — Ambulatory Visit (INDEPENDENT_AMBULATORY_CARE_PROVIDER_SITE_OTHER): Payer: PRIVATE HEALTH INSURANCE | Admitting: Internal Medicine

## 2011-07-07 ENCOUNTER — Ambulatory Visit (INDEPENDENT_AMBULATORY_CARE_PROVIDER_SITE_OTHER): Payer: PRIVATE HEALTH INSURANCE | Admitting: Pharmacist

## 2011-07-07 VITALS — BP 134/74 | HR 93 | Temp 97.7°F | Ht 68.0 in | Wt 212.1 lb

## 2011-07-07 DIAGNOSIS — I4891 Unspecified atrial fibrillation: Secondary | ICD-10-CM

## 2011-07-07 DIAGNOSIS — Z7901 Long term (current) use of anticoagulants: Secondary | ICD-10-CM

## 2011-07-07 DIAGNOSIS — E039 Hypothyroidism, unspecified: Secondary | ICD-10-CM

## 2011-07-07 DIAGNOSIS — D472 Monoclonal gammopathy: Secondary | ICD-10-CM

## 2011-07-07 DIAGNOSIS — E785 Hyperlipidemia, unspecified: Secondary | ICD-10-CM

## 2011-07-07 DIAGNOSIS — Z299 Encounter for prophylactic measures, unspecified: Secondary | ICD-10-CM

## 2011-07-07 DIAGNOSIS — E119 Type 2 diabetes mellitus without complications: Secondary | ICD-10-CM

## 2011-07-07 DIAGNOSIS — I10 Essential (primary) hypertension: Secondary | ICD-10-CM

## 2011-07-07 DIAGNOSIS — Z79899 Other long term (current) drug therapy: Secondary | ICD-10-CM

## 2011-07-07 LAB — CBC WITH DIFFERENTIAL/PLATELET
Basophils Relative: 0 % (ref 0–1)
Eosinophils Absolute: 0.4 10*3/uL (ref 0.0–0.7)
HCT: 28.4 % — ABNORMAL LOW (ref 36.0–46.0)
Hemoglobin: 8.9 g/dL — ABNORMAL LOW (ref 12.0–15.0)
MCH: 28.3 pg (ref 26.0–34.0)
MCHC: 31.3 g/dL (ref 30.0–36.0)
Monocytes Absolute: 0.8 10*3/uL (ref 0.1–1.0)
Monocytes Relative: 12 % (ref 3–12)
Neutro Abs: 4.1 10*3/uL (ref 1.7–7.7)
Neutrophils Relative %: 62 % (ref 43–77)
Platelets: 152 10*3/uL (ref 150–400)
RDW: 16.4 % — ABNORMAL HIGH (ref 11.5–15.5)
WBC: 6.6 10*3/uL (ref 4.0–10.5)

## 2011-07-07 LAB — GLUCOSE, CAPILLARY: Glucose-Capillary: 87 mg/dL (ref 70–99)

## 2011-07-07 LAB — POCT GLYCOSYLATED HEMOGLOBIN (HGB A1C): Hemoglobin A1C: 6.5

## 2011-07-07 LAB — POCT INR: INR: 1.2

## 2011-07-07 NOTE — Assessment & Plan Note (Signed)
Check cholesterol Continue statin

## 2011-07-07 NOTE — Assessment & Plan Note (Signed)
Controlled. Continue metoprolol, lisinopril 

## 2011-07-07 NOTE — Progress Notes (Signed)
Anti-Coagulation Progress Note  [name] is a 77 y.o. female who is currently on an anti-coagulation regimen.    RECENT RESULTS: Recent results are below, the most recent result is correlated with a dose of 0 mg. per week: Lab Results  Component Value Date   INR 1.2 07/07/2011   INR 2.58* 05/07/2011   INR 2.93* 05/06/2011    ANTI-COAG DOSE:   Latest dosing instructions   Total Sun Mon Tue Wed Thu Fri Sat   15 2.5 mg 2.5 mg 1.25 mg 2.5 mg 2.5 mg 1.25 mg 2.5 mg    (2.5 mg1) (2.5 mg1) (2.5 mg0.5) (2.5 mg1) (2.5 mg1) (2.5 mg0.5) (2.5 mg1)         ANTICOAG SUMMARY: Anticoagulation Episode Summary              Current INR goal 2.0-3.0 Next INR check 07/14/2011   INR from last check 1.2! (07/07/2011)     Weekly max dose (mg)  Target end date    Indications Atrial fibrillation, Encounter for long-term (current) use of anticoagulants   INR check location [facility]erred lab    Send INR reminders to    Comments             ANTICOAG TODAY: Anticoagulation Summary as of 07/07/2011              INR goal 2.0-3.0     Selected INR 1.2! (07/07/2011) Next INR check 07/14/2011   Weekly max dose (mg)  Target end date    Indications Atrial fibrillation, Encounter for long-term (current) use of anticoagulants    Anticoagulation Episode Summary              INR check location [facility] Preferred lab    Send INR reminders to    Comments             PATIENT INSTRUCTIONS: Patient Instructions  Patient instructed to take medications as defined in the Anti-coagulation Track section of this encounter.  Patient instructed to take today's dose.  Patient verbalized understanding of these instructions.        FOLLOW-UP Return in 7 days (on 07/14/2011) for Follow up INR.  Hulen Luster, III Pharm.D., CACP

## 2011-07-07 NOTE — Assessment & Plan Note (Signed)
No abnormal s/s of hypothyroidism conitnue synthroid Check TSH

## 2011-07-07 NOTE — Assessment & Plan Note (Addendum)
Controlled Continue glipizde and [place] On ACEi Follow up with opthalmology S/p b/l toe amputation, no open ulcers

## 2011-07-07 NOTE — Patient Instructions (Signed)
I will call in a prescription of pradaxa to your pharmacy once I get the results of blood work from today Follow up with your eye doctor

## 2011-07-07 NOTE — Assessment & Plan Note (Addendum)
Restart pradaxa Check chemistry Follow up compliance at next visit Continue toprol 50 bid  Kidney function worsening, risk of bleeding with pradaxa at 150 bid. GFR <30. Will prescribe lowed dose of pradaxa 75 bid if creatinine still high at next visit. Repeat BMET at next visit.

## 2011-07-07 NOTE — Assessment & Plan Note (Signed)
Followed by Dr. Johnette Abraham Check cbc

## 2011-07-07 NOTE — Patient Instructions (Signed)
Patient instructed to take medications as defined in the Anti-coagulation Track section of this encounter.  Patient instructed to take today's dose.  Patient verbalized understanding of these instructions.    

## 2011-07-07 NOTE — Progress Notes (Signed)
Discussed with Dr. Scot Dock:  Patient had been on dabigatran/Pradaxa 150mg  PO BID prior to hospitalization for required amputation as a result of osteomyelitis. Subsequently she was commenced upon warfarin for prophylaxis against DVT according to the inpatient hospital notes. Given that the period of risk for VTE has now exceed 30 days status-post surgery, her only remaining indication for continued anticoagulation remains her atrial fibrillation for which the patient had been on dabigatran/Pradaxa. After discussion with patient--Dr. Scot Dock and patient elect to recommence her dabigatran/Pradaxa therapy. She is having a serum creatinine drawn for purposes of evaluating her creatinine clearance (method of Crockoft and Gault). If Clcr > 49mL/min--dose will be 150mg  BID of Pradaxa. If Clcr is 15 - 4mL/min--dose will be 75mg  PO BID of Pradaxa. Periodic re-assessment of renal function is advised.

## 2011-07-08 LAB — BASIC METABOLIC PANEL
Chloride: 105 mEq/L (ref 96–112)
Creat: 2.17 mg/dL — ABNORMAL HIGH (ref 0.50–1.10)
Potassium: 4.6 mEq/L (ref 3.5–5.3)
Sodium: 143 mEq/L (ref 135–145)

## 2011-07-08 LAB — LIPID PANEL
HDL: 71 mg/dL (ref >39)
LDL Cholesterol: 86 mg/dL (ref 0–99)
Total CHOL/HDL Ratio: 2.4 Ratio
Triglycerides: 48 mg/dL (ref <150)

## 2011-07-08 NOTE — Progress Notes (Signed)
Patient ID: [name], female   DOB: Mar 20, 1935, 77 y.o.   MRN: [id number]   77 year old woman with past medical history of hypertension, hyperlipidemia, diabetes, atrial fibrillation, hypothyroidism and iron and B12 deficiency comes for followup. Was in the hospital last month for amputation of the right toes. Has been doing fine since surgery. Followed by orthopedic. No complaints today. Compliant with her medications.  Physical exam   General Appearance:     Filed Vitals:   07/07/11 1440  BP: 134/74  Pulse: 93  Temp: 97.7 F (36.5 C)  TempSrc: Oral  Height: 5\' 8"  (1.727 m)  Weight: 212 lb 1.6 oz (96.208 kg)  SpO2: 100%     Alert, cooperative, no distress, appears stated age  Head:    Normocephalic, without obvious abnormality, atraumatic  Eyes:    PERRL, conjunctiva/corneas clear, EOM's intact, fundi    benign, both eyes       Neck:   Supple, symmetrical, trachea midline, no adenopathy;       thyroid:  No enlargement/tenderness/nodules; no carotid   bruit or JVD  Lungs:     Clear to auscultation bilaterally, respirations unlabored  Chest wall:    No tenderness or deformity  Heart:    Regular rate and rhythm, S1 and S2 normal, no murmur, rub   or gallop  Abdomen:     Soft, non-tender, bowel sounds active all four quadrants,    no masses, no organomegaly  Extremities:  bilateral foot amputation  Pulses:   2+ and symmetric all extremities  Skin:   Skin color, texture, turgor normal, no rashes or lesions  Neurologic:  nonfocal grossly    review of system  Constitutional: Denies fever, chills, diaphoresis, appetite change and fatigue.  Respiratory: Denies SOB, DOE, cough, chest tightness,  and wheezing.   Cardiovascular: Denies chest pain, palpitations and leg swelling.  Gastrointestinal: Denies nausea, vomiting, abdominal pain, diarrhea, constipation, blood in stool and abdominal distention.  Skin: Denies pallor, rash and wound.  Neurological: Denies dizziness,  light-headedness, numbness and headaches.

## 2011-07-10 ENCOUNTER — Other Ambulatory Visit: Payer: PRIVATE HEALTH INSURANCE | Admitting: Lab

## 2011-07-10 ENCOUNTER — Ambulatory Visit (HOSPITAL_BASED_OUTPATIENT_CLINIC_OR_DEPARTMENT_OTHER): Payer: PRIVATE HEALTH INSURANCE

## 2011-07-10 VITALS — BP 141/74 | HR 91 | Temp 98.2°F

## 2011-07-10 DIAGNOSIS — D631 Anemia in chronic kidney disease: Secondary | ICD-10-CM

## 2011-07-10 DIAGNOSIS — D472 Monoclonal gammopathy: Secondary | ICD-10-CM

## 2011-07-10 DIAGNOSIS — Z7901 Long term (current) use of anticoagulants: Secondary | ICD-10-CM

## 2011-07-10 DIAGNOSIS — Z299 Encounter for prophylactic measures, unspecified: Secondary | ICD-10-CM

## 2011-07-10 DIAGNOSIS — I4891 Unspecified atrial fibrillation: Secondary | ICD-10-CM

## 2011-07-10 DIAGNOSIS — N189 Chronic kidney disease, unspecified: Secondary | ICD-10-CM

## 2011-07-10 LAB — CBC WITH DIFFERENTIAL/PLATELET
BASO%: 0.3 % (ref 0.0–2.0)
LYMPH%: 20.2 % (ref 14.0–49.7)
MCH: 28.7 pg (ref 25.1–34.0)
MCHC: 32.7 g/dL (ref 31.5–36.0)
MCV: 87.6 fL (ref 79.5–101.0)
MONO%: 11.2 % (ref 0.0–14.0)
Platelets: 129 10*3/uL — ABNORMAL LOW (ref 145–400)
RBC: 3.12 10*6/uL — ABNORMAL LOW (ref 3.70–5.45)

## 2011-07-10 MED ORDER — DARBEPOETIN ALFA-POLYSORBATE 500 MCG/ML IJ SOLN
200.0000 ug | Freq: Once | INTRAMUSCULAR | Status: AC
  Administered 2011-07-10: 200 ug via SUBCUTANEOUS
  Filled 2011-07-10: qty 1

## 2011-07-14 ENCOUNTER — Ambulatory Visit: Payer: PRIVATE HEALTH INSURANCE

## 2011-07-24 ENCOUNTER — Ambulatory Visit (HOSPITAL_BASED_OUTPATIENT_CLINIC_OR_DEPARTMENT_OTHER): Payer: PRIVATE HEALTH INSURANCE

## 2011-07-24 ENCOUNTER — Other Ambulatory Visit (HOSPITAL_BASED_OUTPATIENT_CLINIC_OR_DEPARTMENT_OTHER): Payer: PRIVATE HEALTH INSURANCE | Admitting: Lab

## 2011-07-24 VITALS — BP 145/81 | HR 88 | Temp 97.7°F

## 2011-07-24 DIAGNOSIS — N039 Chronic nephritic syndrome with unspecified morphologic changes: Secondary | ICD-10-CM

## 2011-07-24 DIAGNOSIS — N189 Chronic kidney disease, unspecified: Secondary | ICD-10-CM

## 2011-07-24 DIAGNOSIS — D631 Anemia in chronic kidney disease: Secondary | ICD-10-CM

## 2011-07-24 DIAGNOSIS — D649 Anemia, unspecified: Secondary | ICD-10-CM

## 2011-07-24 LAB — CBC WITH DIFFERENTIAL/PLATELET
BASO%: 0.2 % (ref 0.0–2.0)
EOS%: 3.9 % (ref 0.0–7.0)
LYMPH%: 19.2 % (ref 14.0–49.7)
MCHC: 31.3 g/dL — ABNORMAL LOW (ref 31.5–36.0)
MONO#: 0.8 10*3/uL (ref 0.1–0.9)
Platelets: 154 10*3/uL (ref 145–400)
RBC: 3.65 10*6/uL — ABNORMAL LOW (ref 3.70–5.45)
WBC: 8.2 10*3/uL (ref 3.9–10.3)
lymph#: 1.6 10*3/uL (ref 0.9–3.3)
nRBC: 0 % (ref 0–0)

## 2011-07-24 MED ORDER — DARBEPOETIN ALFA-POLYSORBATE 200 MCG/0.4ML IJ SOLN
200.0000 ug | Freq: Once | INTRAMUSCULAR | Status: AC
  Administered 2011-07-24: 200 ug via SUBCUTANEOUS
  Filled 2011-07-24: qty 1

## 2011-08-01 ENCOUNTER — Telehealth: Payer: Self-pay | Admitting: Oncology

## 2011-08-01 ENCOUNTER — Ambulatory Visit: Payer: PRIVATE HEALTH INSURANCE

## 2011-08-01 ENCOUNTER — Ambulatory Visit (HOSPITAL_BASED_OUTPATIENT_CLINIC_OR_DEPARTMENT_OTHER): Payer: PRIVATE HEALTH INSURANCE | Admitting: Oncology

## 2011-08-01 ENCOUNTER — Other Ambulatory Visit (HOSPITAL_BASED_OUTPATIENT_CLINIC_OR_DEPARTMENT_OTHER): Payer: PRIVATE HEALTH INSURANCE

## 2011-08-01 ENCOUNTER — Encounter: Payer: Self-pay | Admitting: Oncology

## 2011-08-01 VITALS — BP 152/82 | HR 90 | Temp 97.6°F | Ht 68.0 in | Wt 208.6 lb

## 2011-08-01 DIAGNOSIS — N039 Chronic nephritic syndrome with unspecified morphologic changes: Secondary | ICD-10-CM

## 2011-08-01 DIAGNOSIS — N189 Chronic kidney disease, unspecified: Secondary | ICD-10-CM

## 2011-08-01 DIAGNOSIS — D472 Monoclonal gammopathy: Secondary | ICD-10-CM

## 2011-08-01 DIAGNOSIS — I4891 Unspecified atrial fibrillation: Secondary | ICD-10-CM

## 2011-08-01 DIAGNOSIS — D631 Anemia in chronic kidney disease: Secondary | ICD-10-CM

## 2011-08-01 DIAGNOSIS — D518 Other vitamin B12 deficiency anemias: Secondary | ICD-10-CM

## 2011-08-01 DIAGNOSIS — D649 Anemia, unspecified: Secondary | ICD-10-CM

## 2011-08-01 LAB — CBC WITH DIFFERENTIAL/PLATELET
BASO%: 0.3 % (ref 0.0–2.0)
Basophils Absolute: 0 10*3/uL (ref 0.0–0.1)
EOS%: 3.7 % (ref 0.0–7.0)
HGB: 10.6 g/dL — ABNORMAL LOW (ref 11.6–15.9)
MCH: 28.2 pg (ref 25.1–34.0)
RBC: 3.76 10*6/uL (ref 3.70–5.45)
RDW: 17.8 % — ABNORMAL HIGH (ref 11.2–14.5)
lymph#: 1.2 10*3/uL (ref 0.9–3.3)
nRBC: 0 % (ref 0–0)

## 2011-08-01 MED ORDER — DARBEPOETIN ALFA-POLYSORBATE 200 MCG/0.4ML IJ SOLN
200.0000 ug | Freq: Once | INTRAMUSCULAR | Status: DC
  Filled 2011-08-01: qty 0.4

## 2011-08-01 NOTE — Telephone Encounter (Signed)
called pt with 3/21 appt and she will p/u a sch at that time   aom

## 2011-08-01 NOTE — Progress Notes (Signed)
This office note has been dictated.  #[id number]

## 2011-08-01 NOTE — Progress Notes (Signed)
CC:   Madaline Guthrie, M.D. Ramiro Harvest, MD Angelia Mould. Derrell Lolling, M.D. Nadara Mustard, MD  PROBLEM LIST: 1. Anemia most likely due to renal insufficiency. 2. Renal insufficiency. 3. Atrial fibrillation. 4. IgA kappa monoclonal gammopathy with monoclonal kappa light chains     in the urine. 5. Low vitamin B12 level. 6. Osteomyelitis of the left foot status post left mid foot amputation     on 05/30/2010. 7. Status post amputation of toes of right foot in December 2012. 8. Hypertension. 9. Diabetes mellitus. 10.Venous insufficiency. 11.Dyslipidemia. 12.Status post total thyroidectomy for multinodular goiter on     03/13/2006. 13.History of asthma.  MEDICATIONS: 1. Colace 1 capsule twice daily. 2. Doxycycline 100 mg twice daily. 3. Glucotrol 10 mg twice daily. 4. Levothyroxine 112 mcg daily. 5. Lisinopril 20 mg daily. 6. Lopressor 50 mg twice daily. 7. Protonix 40 mg daily. 8. Pravachol 20 mg daily. 9. Januvia 100 mg daily. 10.Aranesp 200 mcg subcutaneously every 2 weeks for hemoglobin less     than or equal to 11. 11.The patient was urged to take over-the-counter vitamin B12 1000 mcg     daily.  HISTORY:  [name] is a 77 year old white female who we follow for anemia associated with renal insufficiency, currently on Aranesp 200 mcg subcu for hemoglobin less than or equal to 11.  The patient has a multitude of problems.  She was last seen here on 04/03/2011.  She is here by herself.  She lives in a senior citizen apartment.  She gets around with a walker.  She underwent some amputations of the toes on her right foot by Dr. Lajoyce Corners in December and was in the [facility] undergoing rehabilitation since February.  The patient apparently is aware of some abnormal heart rhythm, and apparently was being evaluated for anticoagulation therapy, specifically Pradaxa.  I see from her chart that she was on Coumadin in February but apparently she is not on  an anticoagulant at the present time.  I urged the patient to look into this.  She had been on vitamin B12 shots for vitamin B12 deficiency but I thought that she could probably be maintained on oral vitamin B12 especially in view of a vitamin B12 level that came back over 1000 back 4 months ago.  PHYSICAL EXAMINATION:  The patient seems to be stable with no obvious changes.  Weight is 208.6 pounds.  Height 5 feet 8 inches, body surface area 2.13 m sq.  Blood pressure 152/82.  Other vital signs are normal. Her pulse seemed to be regular at times but then was irregular at other times, consistent with atrial fibrillation.  The patient is 76 years old, in no acute distress.  There is no scleral icterus.  Mouth and pharynx are benign.  She does have areas of depigmentation around her mouth.  There is no peripheral adenopathy palpable.  Lungs are clear. Cardiac exam, no murmur or rub with rhythm that was intermittently irregular.  Abdomen with the patient sitting is benign.  Extremities, puffy lower legs but no actual edema.  Neurologic exam was without obvious focal findings.  I did not have the patient get out of her wheelchair.  LABORATORY DATA:  Today, white count 5.9, ANC 4.0, hemoglobin 10.6, hematocrit 34.2, platelets 163,000.  Chemistries, quantitative immunoglobulins and vitamin B12 level are pending.  Chemistries from 04/03/2011 were notable for a BUN of 32, creatinine 1.91.  On 02/18, BUN was 37, creatinine 2.17.  On 04/03/2011 ferritin was 401, iron  saturation 14%.  Vitamin B12 level was 1322.  On 07/11/2010, IgG level was 1480, IgA level 593 and the IgM level was 67.  IMAGING STUDIES: 1. Digital screening mammogram on 07/26/2010 was negative. 2. MRI of the right forefoot without IV contrast on 08/20/2010 showed     apparent new soft tissue ulceration distal to the third metatarsal     status post prior resection of the third toe.  No soft tissue     abscess was demonstrated.   There was interval resolution of the     previously demonstrated marrow edema within the third metatarsal.     No evidence for osteomyelitis.  There was stable mid foot     degenerative changes and chronic myopathic changes. 3. CT of the cervical spine without IV contrast on 10/21/2010 showed     degenerative changes in the cervical spine.  No displaced fractures     were identified. 4. CT of the head without IV contrast on 10/21/2010 showed no evidence     for acute intracranial hemorrhage, mass lesion or acute infarct.     There was some mild diffuse atrophy.  There was a subcutaneous     hematoma over the left anterior frontal/periorbital region.  No     underlying skull fracture or acute intracranial abnormality. 5. Chest x-ray, 2 view, from 04/28/2011 showed stable moderate     cardiomegaly with no active lung disease.  There was probable mild     scarring at the lung bases.  IMPRESSION AND PLAN:  In looking over Ms. Granzow's chart it appears that she had received Aranesp 200 mcg subcu most recently on 03/07, i.e. little more than 2 weeks ago.  She was supposed to be coming in every 2 weeks for CBCs.  Apparently she was not able to do that.  She was here on February 21 at which time her hemoglobin was 8.9, hematocrit 27.3. She did receive Aranesp on that date 200 mcg subcu as she did on 03/07 when her hemoglobin was 10.3.  The last time that she was here before that was on November 29 when her hemoglobin was 10.0 and she received Aranesp 200 mcg on that date as well.  Since the patient received Aranesp a week ago, she should be coming in on March 21 for CBC and every 2 weeks thereafter.  She will receive Aranesp 200 mcg subcu whenever the hemoglobin is less than or equal to 11.  Once again, the patient was urged to take vitamin B12 1000 mcg daily.  I also brought to her attention her regular heartbeat which she is aware of.  She needs to see whether she is supposed to be on  anticoagulation therapy for what appears to be intermittent atrial fibrillation.  We will plan to see Ms. Akamine again in 4 months at which time we will check CBC, chemistries, quantitative immunoglobulins and vitamin B12 level.    ______________________________ Samul Dada, M.D. DSM/MEDQ  D:  08/01/2011  T:  08/01/2011  Job:  [id number]

## 2011-08-02 LAB — IGM: IgM, Serum: 53 mg/dL (ref 52–322)

## 2011-08-02 LAB — COMPREHENSIVE METABOLIC PANEL
ALT: 10 U/L (ref 0–35)
AST: 11 U/L (ref 0–37)
Albumin: 4.1 g/dL (ref 3.5–5.2)
Alkaline Phosphatase: 97 U/L (ref 39–117)
Glucose, Bld: 103 mg/dL — ABNORMAL HIGH (ref 70–99)
Potassium: 4.7 mEq/L (ref 3.5–5.3)
Sodium: 141 mEq/L (ref 135–145)
Total Bilirubin: 0.5 mg/dL (ref 0.3–1.2)
Total Protein: 6.8 g/dL (ref 6.0–8.3)

## 2011-08-03 ENCOUNTER — Other Ambulatory Visit: Payer: Self-pay | Admitting: Oncology

## 2011-08-06 ENCOUNTER — Other Ambulatory Visit: Payer: Self-pay | Admitting: Internal Medicine

## 2011-08-06 DIAGNOSIS — E119 Type 2 diabetes mellitus without complications: Secondary | ICD-10-CM

## 2011-08-06 DIAGNOSIS — E039 Hypothyroidism, unspecified: Secondary | ICD-10-CM

## 2011-08-06 MED ORDER — LEVOTHYROXINE SODIUM 112 MCG PO TABS: 112.0000 ug | ORAL_TABLET | Freq: Every day | ORAL | Status: DC

## 2011-08-06 MED ORDER — SITAGLIPTIN PHOSPHATE 100 MG PO TABS: 100.0000 mg | ORAL_TABLET | Freq: Every day | ORAL | Status: DC

## 2011-08-06 NOTE — Telephone Encounter (Signed)
Patient called requesting for prescriptions for synthroid and Januvia. TSH was checked in 02/13 and was WNL.  Prescriptions were electronically sent to the pharmacy.  Thank you, Shayley Medlin

## 2011-08-07 ENCOUNTER — Other Ambulatory Visit (HOSPITAL_BASED_OUTPATIENT_CLINIC_OR_DEPARTMENT_OTHER): Payer: PRIVATE HEALTH INSURANCE | Admitting: Lab

## 2011-08-07 ENCOUNTER — Ambulatory Visit (HOSPITAL_BASED_OUTPATIENT_CLINIC_OR_DEPARTMENT_OTHER): Payer: PRIVATE HEALTH INSURANCE

## 2011-08-07 VITALS — BP 152/83 | HR 89 | Temp 97.0°F

## 2011-08-07 DIAGNOSIS — N189 Chronic kidney disease, unspecified: Secondary | ICD-10-CM

## 2011-08-07 DIAGNOSIS — D649 Anemia, unspecified: Secondary | ICD-10-CM

## 2011-08-07 DIAGNOSIS — N289 Disorder of kidney and ureter, unspecified: Secondary | ICD-10-CM

## 2011-08-07 DIAGNOSIS — D518 Other vitamin B12 deficiency anemias: Secondary | ICD-10-CM

## 2011-08-07 DIAGNOSIS — E538 Deficiency of other specified B group vitamins: Secondary | ICD-10-CM

## 2011-08-07 LAB — CBC WITH DIFFERENTIAL/PLATELET
Basophils Absolute: 0 10*3/uL (ref 0.0–0.1)
EOS%: 3.7 % (ref 0.0–7.0)
HCT: 33.9 % — ABNORMAL LOW (ref 34.8–46.6)
HGB: 10.6 g/dL — ABNORMAL LOW (ref 11.6–15.9)
MCH: 27.9 pg (ref 25.1–34.0)
MCV: 89.2 fL (ref 79.5–101.0)
MONO%: 12.5 % (ref 0.0–14.0)
NEUT%: 65.5 % (ref 38.4–76.8)

## 2011-08-07 MED ORDER — CYANOCOBALAMIN 1000 MCG/ML IJ SOLN
1000.0000 ug | Freq: Once | INTRAMUSCULAR | Status: AC
  Administered 2011-08-07: 1000 ug via INTRAMUSCULAR

## 2011-08-07 MED ORDER — DARBEPOETIN ALFA-POLYSORBATE 500 MCG/ML IJ SOLN
200.0000 ug | Freq: Once | INTRAMUSCULAR | Status: AC
  Administered 2011-08-07: 200 ug via SUBCUTANEOUS

## 2011-08-07 NOTE — Patient Instructions (Signed)
Pt discharged home after injections.  Pt given appt. Calendar for the next 30 days.

## 2011-08-08 ENCOUNTER — Other Ambulatory Visit: Payer: Self-pay | Admitting: *Deleted

## 2011-08-08 DIAGNOSIS — E785 Hyperlipidemia, unspecified: Secondary | ICD-10-CM

## 2011-08-10 ENCOUNTER — Other Ambulatory Visit: Payer: Self-pay | Admitting: Internal Medicine

## 2011-08-11 MED ORDER — PANTOPRAZOLE SODIUM 40 MG PO TBEC: 40.0000 mg | DELAYED_RELEASE_TABLET | Freq: Every day | ORAL | Status: DC

## 2011-08-11 MED ORDER — PRAVASTATIN SODIUM 20 MG PO TABS: 20.0000 mg | ORAL_TABLET | Freq: Every day | ORAL | Status: DC

## 2011-08-20 ENCOUNTER — Other Ambulatory Visit: Payer: Self-pay | Admitting: *Deleted

## 2011-08-21 ENCOUNTER — Other Ambulatory Visit (HOSPITAL_BASED_OUTPATIENT_CLINIC_OR_DEPARTMENT_OTHER): Payer: PRIVATE HEALTH INSURANCE | Admitting: Lab

## 2011-08-21 ENCOUNTER — Ambulatory Visit (HOSPITAL_BASED_OUTPATIENT_CLINIC_OR_DEPARTMENT_OTHER): Payer: PRIVATE HEALTH INSURANCE

## 2011-08-21 DIAGNOSIS — D631 Anemia in chronic kidney disease: Secondary | ICD-10-CM

## 2011-08-21 DIAGNOSIS — D510 Vitamin B12 deficiency anemia due to intrinsic factor deficiency: Secondary | ICD-10-CM

## 2011-08-21 DIAGNOSIS — N289 Disorder of kidney and ureter, unspecified: Secondary | ICD-10-CM

## 2011-08-21 DIAGNOSIS — D518 Other vitamin B12 deficiency anemias: Secondary | ICD-10-CM

## 2011-08-21 LAB — CBC WITH DIFFERENTIAL/PLATELET
Basophils Absolute: 0 10*3/uL (ref 0.0–0.1)
HCT: 39.8 % (ref 34.8–46.6)
HGB: 12.5 g/dL (ref 11.6–15.9)
MONO#: 0.7 10*3/uL (ref 0.1–0.9)
NEUT%: 66.7 % (ref 38.4–76.8)
WBC: 7 10*3/uL (ref 3.9–10.3)
lymph#: 1.3 10*3/uL (ref 0.9–3.3)

## 2011-08-21 MED ORDER — CYANOCOBALAMIN 1000 MCG/ML IJ SOLN
1000.0000 ug | Freq: Once | INTRAMUSCULAR | Status: AC
  Administered 2011-08-21: 1000 ug via INTRAMUSCULAR

## 2011-08-21 NOTE — Progress Notes (Signed)
hgb 12,.5  No aranesp today

## 2011-08-27 NOTE — Progress Notes (Signed)
Addended by: Neomia Dear on: 08/27/2011 09:02 PM   Modules accepted: Orders

## 2011-09-04 ENCOUNTER — Ambulatory Visit: Payer: PRIVATE HEALTH INSURANCE

## 2011-09-15 ENCOUNTER — Other Ambulatory Visit: Payer: Self-pay | Admitting: Internal Medicine

## 2011-09-18 ENCOUNTER — Ambulatory Visit (HOSPITAL_BASED_OUTPATIENT_CLINIC_OR_DEPARTMENT_OTHER): Payer: PRIVATE HEALTH INSURANCE

## 2011-09-18 ENCOUNTER — Other Ambulatory Visit (HOSPITAL_BASED_OUTPATIENT_CLINIC_OR_DEPARTMENT_OTHER): Payer: PRIVATE HEALTH INSURANCE | Admitting: Lab

## 2011-09-18 VITALS — BP 171/78 | HR 78 | Temp 97.6°F

## 2011-09-18 DIAGNOSIS — D518 Other vitamin B12 deficiency anemias: Secondary | ICD-10-CM

## 2011-09-18 DIAGNOSIS — N189 Chronic kidney disease, unspecified: Secondary | ICD-10-CM

## 2011-09-18 DIAGNOSIS — D510 Vitamin B12 deficiency anemia due to intrinsic factor deficiency: Secondary | ICD-10-CM

## 2011-09-18 DIAGNOSIS — D631 Anemia in chronic kidney disease: Secondary | ICD-10-CM

## 2011-09-18 LAB — CBC WITH DIFFERENTIAL/PLATELET
Basophils Absolute: 0 10*3/uL (ref 0.0–0.1)
Eosinophils Absolute: 0.2 10*3/uL (ref 0.0–0.5)
HCT: 38.5 % (ref 34.8–46.6)
HGB: 12.5 g/dL (ref 11.6–15.9)
LYMPH%: 23.9 % (ref 14.0–49.7)
MCV: 87.1 fL (ref 79.5–101.0)
MONO%: 7.4 % (ref 0.0–14.0)
NEUT#: 4.8 10*3/uL (ref 1.5–6.5)
Platelets: 137 10*3/uL — ABNORMAL LOW (ref 145–400)

## 2011-09-18 MED ORDER — CYANOCOBALAMIN 1000 MCG/ML IJ SOLN
1000.0000 ug | Freq: Once | INTRAMUSCULAR | Status: AC
  Administered 2011-09-18: 1000 ug via INTRAMUSCULAR

## 2011-09-18 NOTE — Patient Instructions (Signed)
Call MD for problems 

## 2011-10-01 ENCOUNTER — Telehealth: Payer: Self-pay | Admitting: Oncology

## 2011-10-01 NOTE — Telephone Encounter (Signed)
pt called to tr/s 5/16 lab inj to 5/17   done  aom

## 2011-10-02 ENCOUNTER — Other Ambulatory Visit: Payer: PRIVATE HEALTH INSURANCE | Admitting: Lab

## 2011-10-02 ENCOUNTER — Ambulatory Visit: Payer: PRIVATE HEALTH INSURANCE

## 2011-10-03 ENCOUNTER — Ambulatory Visit: Payer: PRIVATE HEALTH INSURANCE

## 2011-10-03 ENCOUNTER — Other Ambulatory Visit (HOSPITAL_BASED_OUTPATIENT_CLINIC_OR_DEPARTMENT_OTHER): Payer: PRIVATE HEALTH INSURANCE | Admitting: Lab

## 2011-10-03 DIAGNOSIS — D631 Anemia in chronic kidney disease: Secondary | ICD-10-CM

## 2011-10-03 DIAGNOSIS — N189 Chronic kidney disease, unspecified: Secondary | ICD-10-CM

## 2011-10-03 DIAGNOSIS — N039 Chronic nephritic syndrome with unspecified morphologic changes: Secondary | ICD-10-CM

## 2011-10-03 DIAGNOSIS — D518 Other vitamin B12 deficiency anemias: Secondary | ICD-10-CM

## 2011-10-03 LAB — CBC WITH DIFFERENTIAL/PLATELET
BASO%: 0.5 % (ref 0.0–2.0)
HCT: 36.4 % (ref 34.8–46.6)
LYMPH%: 20.3 % (ref 14.0–49.7)
MCH: 28.4 pg (ref 25.1–34.0)
MCHC: 32.2 g/dL (ref 31.5–36.0)
MONO#: 0.7 10*3/uL (ref 0.1–0.9)
NEUT%: 64.6 % (ref 38.4–76.8)
Platelets: 106 10*3/uL — ABNORMAL LOW (ref 145–400)
WBC: 6.5 10*3/uL (ref 3.9–10.3)

## 2011-10-03 MED ORDER — DARBEPOETIN ALFA-POLYSORBATE 500 MCG/ML IJ SOLN: 200.0000 ug | Freq: Once | INTRAMUSCULAR | Status: DC

## 2011-10-16 ENCOUNTER — Ambulatory Visit: Payer: PRIVATE HEALTH INSURANCE

## 2011-10-16 ENCOUNTER — Other Ambulatory Visit: Payer: PRIVATE HEALTH INSURANCE | Admitting: Lab

## 2011-10-17 ENCOUNTER — Other Ambulatory Visit (HOSPITAL_BASED_OUTPATIENT_CLINIC_OR_DEPARTMENT_OTHER): Payer: PRIVATE HEALTH INSURANCE | Admitting: Lab

## 2011-10-17 ENCOUNTER — Ambulatory Visit (HOSPITAL_BASED_OUTPATIENT_CLINIC_OR_DEPARTMENT_OTHER): Payer: PRIVATE HEALTH INSURANCE

## 2011-10-17 VITALS — BP 135/87 | HR 99 | Temp 97.7°F

## 2011-10-17 DIAGNOSIS — D649 Anemia, unspecified: Secondary | ICD-10-CM

## 2011-10-17 DIAGNOSIS — N189 Chronic kidney disease, unspecified: Secondary | ICD-10-CM

## 2011-10-17 DIAGNOSIS — D510 Vitamin B12 deficiency anemia due to intrinsic factor deficiency: Secondary | ICD-10-CM

## 2011-10-17 DIAGNOSIS — D631 Anemia in chronic kidney disease: Secondary | ICD-10-CM

## 2011-10-17 DIAGNOSIS — D518 Other vitamin B12 deficiency anemias: Secondary | ICD-10-CM

## 2011-10-17 LAB — CBC WITH DIFFERENTIAL/PLATELET
BASO%: 0.3 % (ref 0.0–2.0)
Basophils Absolute: 0 10e3/uL (ref 0.0–0.1)
EOS%: 3.8 % (ref 0.0–7.0)
Eosinophils Absolute: 0.3 10e3/uL (ref 0.0–0.5)
HCT: 33.6 % — ABNORMAL LOW (ref 34.8–46.6)
HGB: 10.8 g/dL — ABNORMAL LOW (ref 11.6–15.9)
LYMPH%: 21.6 % (ref 14.0–49.7)
MCH: 27.7 pg (ref 25.1–34.0)
MCHC: 32.1 g/dL (ref 31.5–36.0)
MCV: 86.2 fL (ref 79.5–101.0)
MONO#: 0.7 10e3/uL (ref 0.1–0.9)
MONO%: 9.5 % (ref 0.0–14.0)
NEUT#: 4.8 10e3/uL (ref 1.5–6.5)
NEUT%: 64.8 % (ref 38.4–76.8)
Platelets: 125 10e3/uL — ABNORMAL LOW (ref 145–400)
RBC: 3.9 10e6/uL (ref 3.70–5.45)
RDW: 15.3 % — ABNORMAL HIGH (ref 11.2–14.5)
WBC: 7.4 10e3/uL (ref 3.9–10.3)
lymph#: 1.6 10e3/uL (ref 0.9–3.3)
nRBC: 0 % (ref 0–0)

## 2011-10-17 MED ORDER — DARBEPOETIN ALFA-POLYSORBATE 200 MCG/0.4ML IJ SOLN
200.0000 ug | Freq: Once | INTRAMUSCULAR | Status: AC
  Administered 2011-10-17: 200 ug via SUBCUTANEOUS
  Filled 2011-10-17: qty 0.4

## 2011-10-17 MED ORDER — CYANOCOBALAMIN 1000 MCG/ML IJ SOLN
1000.0000 ug | Freq: Once | INTRAMUSCULAR | Status: AC
  Administered 2011-10-17: 1000 ug via INTRAMUSCULAR

## 2011-10-30 ENCOUNTER — Ambulatory Visit: Payer: PRIVATE HEALTH INSURANCE

## 2011-10-30 ENCOUNTER — Telehealth: Payer: Self-pay | Admitting: Oncology

## 2011-10-30 ENCOUNTER — Other Ambulatory Visit: Payer: PRIVATE HEALTH INSURANCE | Admitting: Lab

## 2011-10-30 NOTE — Telephone Encounter (Signed)
pt called and cx 6/13 appts as she has no transport,tried to get her to r/s to 6/17 and she req to wait until 6/27 apptm   aom

## 2011-11-12 ENCOUNTER — Telehealth: Payer: Self-pay | Admitting: Oncology

## 2011-11-12 NOTE — Telephone Encounter (Signed)
pt called and cx 6/27 lab and inj states no transport.

## 2011-11-13 ENCOUNTER — Other Ambulatory Visit: Payer: PRIVATE HEALTH INSURANCE | Admitting: Lab

## 2011-11-13 ENCOUNTER — Ambulatory Visit: Payer: PRIVATE HEALTH INSURANCE

## 2011-11-17 ENCOUNTER — Ambulatory Visit (HOSPITAL_BASED_OUTPATIENT_CLINIC_OR_DEPARTMENT_OTHER): Payer: PRIVATE HEALTH INSURANCE

## 2011-11-17 ENCOUNTER — Other Ambulatory Visit: Payer: PRIVATE HEALTH INSURANCE

## 2011-11-17 VITALS — BP 141/89 | HR 98 | Temp 97.3°F

## 2011-11-17 DIAGNOSIS — D518 Other vitamin B12 deficiency anemias: Secondary | ICD-10-CM

## 2011-11-17 DIAGNOSIS — D510 Vitamin B12 deficiency anemia due to intrinsic factor deficiency: Secondary | ICD-10-CM

## 2011-11-17 DIAGNOSIS — D631 Anemia in chronic kidney disease: Secondary | ICD-10-CM

## 2011-11-17 DIAGNOSIS — N189 Chronic kidney disease, unspecified: Secondary | ICD-10-CM

## 2011-11-17 LAB — CBC WITH DIFFERENTIAL/PLATELET
BASO%: 0.4 % (ref 0.0–2.0)
EOS%: 3.5 % (ref 0.0–7.0)
Eosinophils Absolute: 0.3 10*3/uL (ref 0.0–0.5)
LYMPH%: 21.4 % (ref 14.0–49.7)
MCH: 28.4 pg (ref 25.1–34.0)
MCHC: 32.2 g/dL (ref 31.5–36.0)
MCV: 88.3 fL (ref 79.5–101.0)
MONO%: 11.7 % (ref 0.0–14.0)
Platelets: 125 10*3/uL — ABNORMAL LOW (ref 145–400)
RBC: 4.13 10*6/uL (ref 3.70–5.45)
RDW: 17.4 % — ABNORMAL HIGH (ref 11.2–14.5)

## 2011-11-17 MED ORDER — DARBEPOETIN ALFA-POLYSORBATE 500 MCG/ML IJ SOLN: 200.0000 ug | Freq: Once | INTRAMUSCULAR | Status: DC

## 2011-11-17 MED ORDER — CYANOCOBALAMIN 1000 MCG/ML IJ SOLN
1000.0000 ug | Freq: Once | INTRAMUSCULAR | Status: AC
  Administered 2011-11-17: 1000 ug via INTRAMUSCULAR

## 2011-11-27 ENCOUNTER — Other Ambulatory Visit: Payer: Self-pay | Admitting: Oncology

## 2011-11-27 ENCOUNTER — Ambulatory Visit (HOSPITAL_BASED_OUTPATIENT_CLINIC_OR_DEPARTMENT_OTHER): Payer: PRIVATE HEALTH INSURANCE | Admitting: Oncology

## 2011-11-27 ENCOUNTER — Encounter: Payer: Self-pay | Admitting: Oncology

## 2011-11-27 ENCOUNTER — Ambulatory Visit: Payer: PRIVATE HEALTH INSURANCE

## 2011-11-27 ENCOUNTER — Other Ambulatory Visit (HOSPITAL_BASED_OUTPATIENT_CLINIC_OR_DEPARTMENT_OTHER): Payer: PRIVATE HEALTH INSURANCE

## 2011-11-27 VITALS — BP 134/77 | HR 67 | Temp 97.4°F | Ht 65.0 in | Wt 196.9 lb

## 2011-11-27 DIAGNOSIS — D649 Anemia, unspecified: Secondary | ICD-10-CM

## 2011-11-27 DIAGNOSIS — D510 Vitamin B12 deficiency anemia due to intrinsic factor deficiency: Secondary | ICD-10-CM

## 2011-11-27 DIAGNOSIS — D518 Other vitamin B12 deficiency anemias: Secondary | ICD-10-CM

## 2011-11-27 DIAGNOSIS — N189 Chronic kidney disease, unspecified: Secondary | ICD-10-CM

## 2011-11-27 DIAGNOSIS — D631 Anemia in chronic kidney disease: Secondary | ICD-10-CM

## 2011-11-27 DIAGNOSIS — N289 Disorder of kidney and ureter, unspecified: Secondary | ICD-10-CM

## 2011-11-27 DIAGNOSIS — D472 Monoclonal gammopathy: Secondary | ICD-10-CM

## 2011-11-27 LAB — CBC WITH DIFFERENTIAL/PLATELET
BASO%: 0.4 % (ref 0.0–2.0)
LYMPH%: 23.8 % (ref 14.0–49.7)
MCHC: 32.4 g/dL (ref 31.5–36.0)
MONO#: 0.9 10*3/uL (ref 0.1–0.9)
NEUT#: 4.7 10*3/uL (ref 1.5–6.5)
Platelets: 108 10*3/uL — ABNORMAL LOW (ref 145–400)
RBC: 4.02 10*6/uL (ref 3.70–5.45)
RDW: 16.9 % — ABNORMAL HIGH (ref 11.2–14.5)
WBC: 7.7 10*3/uL (ref 3.9–10.3)

## 2011-11-27 MED ORDER — DARBEPOETIN ALFA-POLYSORBATE 500 MCG/ML IJ SOLN: 200.0000 ug | Freq: Once | INTRAMUSCULAR | Status: DC

## 2011-11-27 NOTE — Progress Notes (Signed)
This office note has been dictated.  #[id number]

## 2011-11-27 NOTE — Progress Notes (Signed)
Vitamin B12 level today was 1141. The patient has been receiving vitamin B12 shots every 4 weeks. We will increase the interval to every 8 weeks. The dose is 1000 mcg IM.

## 2011-11-27 NOTE — Progress Notes (Signed)
CC:   Madaline Guthrie, M.D. Ramiro Harvest, MD Angelia Mould. Derrell Lolling, M.D. Nadara Mustard, MD   PROBLEM LIST:  1. Anemia most likely due to renal insufficiency.  2. Renal insufficiency.  3. Atrial fibrillation.  4. IgA kappa monoclonal gammopathy with monoclonal kappa light chains  in the urine.  5. Low vitamin B12 level.  6. Osteomyelitis of the left foot status post left mid foot amputation  on 05/30/2010.  7. Status post amputation of toes of right foot in December 2012.  8. Hypertension.  9. Diabetes mellitus.  10.Venous insufficiency.  11.Dyslipidemia.  12.Status post total thyroidectomy for multinodular goiter on  03/13/2006.  13.History of asthma. 14.Thrombocytopenia with variable platelet count first noted 08/2010,     suspect chronic idiopathic thrombocytopenic purpura.   MEDICATIONS:  1. Colace 1 capsule twice daily.  2. Doxycycline 100 mg twice daily. This was apparently discontinued 1 month ago. 3. Glucotrol 10 mg twice daily.  4. Levothyroxine 112 mcg daily.  5. Lisinopril 20 mg daily.  6. Lopressor 50 mg twice daily.  7. Protonix 40 mg daily.  8. Pravachol 20 mg daily.  9. Januvia 100 mg daily.  10.Aranesp 200 mcg subcutaneously every 2 weeks for hemoglobin less  than or equal to 11. 11.Vitamin B12 1000 mcg IM every 4 weeks.   SMOKING HISTORY:  The patient denies any history of cigarette smoking.   HISTORY:  I saw Kiasha Bellin today for followup of her anemia associated with renal insufficiency, currently on Aranesp 200 mcg subcutaneous for hemoglobin less than or equal to 11.  The patient also receives monthly vitamin B12 shots.  She was last seen by [place] on 08/01/2011.  She lives in a senior citizen apartment and gets around with a walker.  She denies any intercurrent problems over the past 4 months and is without any new complaints today.  Of note, is the fact that the patient has a history of atrial fibrillation.  Apparently, she is not on any  anticoagulation therapy.  The patient did not bring in her medicines with her today.  PHYSICAL EXAMINATION:  There is little apparent change.  The patient is in a wheelchair.  She is lethargic.  Weight today is 196.9 pounds, height 5 feet 5 inches, body surface area 2.2 m squared.  Blood pressure 134/77.  Other vital signs are normal except for an irregular pulse consistent with atrial fibrillation.  This was brought to the patient's attention.  There is no scleral icterus.  Mouth and pharynx are benign. She has areas of depigmentation around her mouth and back.  No peripheral adenopathy palpable.  Lungs: Clear.  Cardiac:  Irregular rhythm consistent with atrial fibrillation.  No murmur or rub.  Abdomen: With the patient sitting is benign.  Extremities:  No peripheral edema, petechiae or purpura.  I did not have the patient get out of her wheelchair.  LABORATORY DATA:  Today, white count 7.7, ANC 4.7, hemoglobin 11.5, hematocrit 35.3, platelets 108,000.  On 07/01 and 05/31, platelet count was 125,000 and at the time of the patient's last visit here her platelet count was 163,000.  Chemistries today are pending.  Chemistries from 08/01/2011 notable for a BUN of 40, creatinine 2.42.  Albumin was 4.1.  Vitamin B12 level 275, whereas it had been 1322 on 04/03/2011. IgG level 1330, IgA 613 and IgM 53.  The patient has had a stable elevation of her IgA level dating back at least to April of 2007.  IMAGING STUDIES:  1. Digital  screening mammogram on 07/26/2010 was negative.  2. MRI of the right forefoot without IV contrast on 08/20/2010 showed  apparent new soft tissue ulceration distal to the third metatarsal  status post prior resection of the third toe. No soft tissue  abscess was demonstrated. There was interval resolution of the  previously demonstrated marrow edema within the third metatarsal.  No evidence for osteomyelitis. There was stable mid foot  degenerative changes and chronic  myopathic changes.  3. CT of the cervical spine without IV contrast on 10/21/2010 showed  degenerative changes in the cervical spine. No displaced fractures  were identified.  4. CT of the head without IV contrast on 10/21/2010 showed no evidence  for acute intracranial hemorrhage, mass lesion or acute infarct.  There was some mild diffuse atrophy. There was a subcutaneous  hematoma over the left anterior frontal/periorbital region. No  underlying skull fracture or acute intracranial abnormality.  5. Chest x-ray, 2 view, from 04/28/2011 showed stable moderate  cardiomegaly with no active lung disease. There was probable mild  scarring at the lung bases.   IMPRESSION AND PLAN:  Ms. Dilley seems to be doing well with multiple medical problems.  Her most recent dose of Aranesp 200 mcg subcutaneous was given on 10/17/2011 when her hemoglobin was 10.8.  At one point back on 08/21/2011 hemoglobin had gotten up to 12.5.  Today she does not need Aranesp.  Her last vitamin B12 shot which she is receiving monthly was on July 1st 1000 mcg IM.  I note that the patient has not had mammograms this year. Once again, this was brought to her attention.  Last mammograms were done on 07/26/2010 and were negative.  The patient also has atrial fibrillation.  She is not on anticoagulants.  Apparently, she had suffered a fall and some head trauma back in June of 2012.  We will continue monitoring CBCs every 2 weeks and giving Aranesp 200 mcg subcutaneous whenever the hemoglobin is less than or equal to 11. The patient will continue with monthly vitamin B12 shots 1000 mcg IM. We will plan to see her again in 4 months at which time we will check CBC, chemistries, vitamin B12 level and quantitative immunoglobulins. The patient will see the mid level on that visit.    ______________________________ Samul Dada, M.D. DSM/MEDQ  D:  11/27/2011  T:  11/27/2011  Job:  [id number]

## 2011-11-28 LAB — COMPREHENSIVE METABOLIC PANEL
ALT: 9 U/L (ref 0–35)
Albumin: 3.9 g/dL (ref 3.5–5.2)
Alkaline Phosphatase: 80 U/L (ref 39–117)
CO2: 21 mEq/L (ref 19–32)
Potassium: 4.8 mEq/L (ref 3.5–5.3)
Sodium: 140 mEq/L (ref 135–145)
Total Bilirubin: 0.4 mg/dL (ref 0.3–1.2)
Total Protein: 7.2 g/dL (ref 6.0–8.3)

## 2011-11-28 LAB — IGG, IGA, IGM: IgG (Immunoglobin G), Serum: 1280 mg/dL (ref 690–1700)

## 2011-11-28 LAB — VITAMIN B12: Vitamin B-12: 1141 pg/mL — ABNORMAL HIGH (ref 211–911)

## 2011-12-03 ENCOUNTER — Telehealth: Payer: Self-pay | Admitting: Oncology

## 2011-12-03 NOTE — Telephone Encounter (Signed)
called pt with 7/25 appt and she will get a print out at that time

## 2011-12-04 ENCOUNTER — Other Ambulatory Visit: Payer: Self-pay | Admitting: Internal Medicine

## 2011-12-05 ENCOUNTER — Other Ambulatory Visit: Payer: PRIVATE HEALTH INSURANCE | Admitting: Lab

## 2011-12-05 ENCOUNTER — Ambulatory Visit: Payer: PRIVATE HEALTH INSURANCE | Admitting: Oncology

## 2011-12-11 ENCOUNTER — Other Ambulatory Visit: Payer: PRIVATE HEALTH INSURANCE | Admitting: Lab

## 2011-12-11 ENCOUNTER — Telehealth: Payer: Self-pay | Admitting: Oncology

## 2011-12-11 ENCOUNTER — Ambulatory Visit: Payer: PRIVATE HEALTH INSURANCE

## 2011-12-11 NOTE — Telephone Encounter (Signed)
Pt cancelled appt for 7/25 lab and injection, appt cancelled and Felicita Gage aware of cancellation, informed pt to call nurse in case she needs to come in

## 2011-12-17 ENCOUNTER — Telehealth: Payer: Self-pay | Admitting: *Deleted

## 2011-12-17 NOTE — Telephone Encounter (Signed)
Call from [place], Nurse Practioner with [facility] stating she did a home visit today and reports pt is non compliant with checking blood sugars. She did not take antibiotics after amputation of toes from diabetic ulcers .  This is a FYI, she does not need to hear back from you but wanted you to be aware.   # [phone]

## 2011-12-25 ENCOUNTER — Other Ambulatory Visit (HOSPITAL_BASED_OUTPATIENT_CLINIC_OR_DEPARTMENT_OTHER): Payer: PRIVATE HEALTH INSURANCE | Admitting: Lab

## 2011-12-25 ENCOUNTER — Ambulatory Visit (HOSPITAL_BASED_OUTPATIENT_CLINIC_OR_DEPARTMENT_OTHER): Payer: PRIVATE HEALTH INSURANCE

## 2011-12-25 VITALS — BP 136/74 | HR 76 | Temp 97.1°F

## 2011-12-25 DIAGNOSIS — N189 Chronic kidney disease, unspecified: Secondary | ICD-10-CM

## 2011-12-25 DIAGNOSIS — D631 Anemia in chronic kidney disease: Secondary | ICD-10-CM

## 2011-12-25 DIAGNOSIS — D518 Other vitamin B12 deficiency anemias: Secondary | ICD-10-CM

## 2011-12-25 LAB — CBC WITH DIFFERENTIAL/PLATELET
BASO%: 0.5 % (ref 0.0–2.0)
EOS%: 2.9 % (ref 0.0–7.0)
HCT: 31.8 % — ABNORMAL LOW (ref 34.8–46.6)
LYMPH%: 23.8 % (ref 14.0–49.7)
MCH: 28.2 pg (ref 25.1–34.0)
MCHC: 32.4 g/dL (ref 31.5–36.0)
MONO#: 0.8 10*3/uL (ref 0.1–0.9)
MONO%: 10.1 % (ref 0.0–14.0)
NEUT%: 62.7 % (ref 38.4–76.8)
Platelets: 128 10*3/uL — ABNORMAL LOW (ref 145–400)
RBC: 3.65 10*6/uL — ABNORMAL LOW (ref 3.70–5.45)
WBC: 8 10*3/uL (ref 3.9–10.3)
nRBC: 0 % (ref 0–0)

## 2011-12-25 MED ORDER — DARBEPOETIN ALFA-POLYSORBATE 200 MCG/0.4ML IJ SOLN
200.0000 ug | Freq: Once | INTRAMUSCULAR | Status: AC
  Administered 2011-12-25: 200 ug via SUBCUTANEOUS
  Filled 2011-12-25: qty 0.4

## 2012-01-07 ENCOUNTER — Telehealth: Payer: Self-pay | Admitting: Oncology

## 2012-01-07 NOTE — Telephone Encounter (Signed)
pt callesd to r/s 8/22 lab and inj to 8/26 due to transport     aom

## 2012-01-08 ENCOUNTER — Ambulatory Visit: Payer: PRIVATE HEALTH INSURANCE

## 2012-01-08 ENCOUNTER — Other Ambulatory Visit: Payer: PRIVATE HEALTH INSURANCE | Admitting: Lab

## 2012-01-12 ENCOUNTER — Other Ambulatory Visit (HOSPITAL_BASED_OUTPATIENT_CLINIC_OR_DEPARTMENT_OTHER): Payer: PRIVATE HEALTH INSURANCE | Admitting: Lab

## 2012-01-12 ENCOUNTER — Ambulatory Visit (HOSPITAL_BASED_OUTPATIENT_CLINIC_OR_DEPARTMENT_OTHER): Payer: PRIVATE HEALTH INSURANCE

## 2012-01-12 VITALS — BP 142/94 | HR 108 | Temp 97.9°F | Resp 20

## 2012-01-12 DIAGNOSIS — D518 Other vitamin B12 deficiency anemias: Secondary | ICD-10-CM

## 2012-01-12 DIAGNOSIS — D510 Vitamin B12 deficiency anemia due to intrinsic factor deficiency: Secondary | ICD-10-CM

## 2012-01-12 DIAGNOSIS — N039 Chronic nephritic syndrome with unspecified morphologic changes: Secondary | ICD-10-CM

## 2012-01-12 DIAGNOSIS — N189 Chronic kidney disease, unspecified: Secondary | ICD-10-CM

## 2012-01-12 LAB — CBC WITH DIFFERENTIAL/PLATELET
Basophils Absolute: 0.1 10*3/uL (ref 0.0–0.1)
EOS%: 3.3 % (ref 0.0–7.0)
HCT: 35.4 % (ref 34.8–46.6)
HGB: 11.6 g/dL (ref 11.6–15.9)
MCH: 30.2 pg (ref 25.1–34.0)
MCV: 92.5 fL (ref 79.5–101.0)
MONO%: 12.1 % (ref 0.0–14.0)
NEUT%: 63.5 % (ref 38.4–76.8)

## 2012-01-12 MED ORDER — CYANOCOBALAMIN 1000 MCG/ML IJ SOLN
1000.0000 ug | Freq: Once | INTRAMUSCULAR | Status: AC
  Administered 2012-01-12: 1000 ug via INTRAMUSCULAR

## 2012-01-12 NOTE — Progress Notes (Signed)
hgb 11.6, aranesp not indicated.  dmr

## 2012-01-22 ENCOUNTER — Other Ambulatory Visit (HOSPITAL_BASED_OUTPATIENT_CLINIC_OR_DEPARTMENT_OTHER): Payer: PRIVATE HEALTH INSURANCE | Admitting: Lab

## 2012-01-22 ENCOUNTER — Ambulatory Visit: Payer: PRIVATE HEALTH INSURANCE

## 2012-01-22 DIAGNOSIS — D631 Anemia in chronic kidney disease: Secondary | ICD-10-CM

## 2012-01-22 DIAGNOSIS — N189 Chronic kidney disease, unspecified: Secondary | ICD-10-CM

## 2012-01-22 DIAGNOSIS — N039 Chronic nephritic syndrome with unspecified morphologic changes: Secondary | ICD-10-CM

## 2012-01-22 DIAGNOSIS — D518 Other vitamin B12 deficiency anemias: Secondary | ICD-10-CM

## 2012-01-22 LAB — CBC WITH DIFFERENTIAL/PLATELET
EOS%: 2.6 % (ref 0.0–7.0)
MCH: 30 pg (ref 25.1–34.0)
MCHC: 32.8 g/dL (ref 31.5–36.0)
MCV: 91.5 fL (ref 79.5–101.0)
MONO%: 12.5 % (ref 0.0–14.0)
RBC: 3.9 10*6/uL (ref 3.70–5.45)
RDW: 15.3 % — ABNORMAL HIGH (ref 11.2–14.5)

## 2012-01-22 MED ORDER — DARBEPOETIN ALFA-POLYSORBATE 500 MCG/ML IJ SOLN: 200.0000 ug | Freq: Once | INTRAMUSCULAR | Status: DC

## 2012-02-04 ENCOUNTER — Other Ambulatory Visit: Payer: Self-pay | Admitting: Internal Medicine

## 2012-02-05 ENCOUNTER — Ambulatory Visit: Payer: PRIVATE HEALTH INSURANCE

## 2012-02-05 ENCOUNTER — Other Ambulatory Visit: Payer: PRIVATE HEALTH INSURANCE | Admitting: Lab

## 2012-02-19 ENCOUNTER — Other Ambulatory Visit: Payer: PRIVATE HEALTH INSURANCE | Admitting: Lab

## 2012-02-19 ENCOUNTER — Ambulatory Visit: Payer: PRIVATE HEALTH INSURANCE

## 2012-02-19 ENCOUNTER — Telehealth: Payer: Self-pay | Admitting: *Deleted

## 2012-02-19 NOTE — Telephone Encounter (Signed)
Called patient because of missed appointment.   She states that she cancelled appointment with the automated system and no one has called back to reschedule her.  She doesn't have any transportation on thursdays.  She is not sure when she can get here and she needs to talk to her daughter.  I told her to talk to her daughter and find out when she will have a ride, then she can call us back and schedule an appointment. Patient agreed with this plan.

## 2012-03-03 ENCOUNTER — Telehealth: Payer: Self-pay | Admitting: Oncology

## 2012-03-03 NOTE — Telephone Encounter (Signed)
PT CALLED AND REQ TO CHANGE ALL  APPTS TO TUESDAYS

## 2012-03-04 ENCOUNTER — Ambulatory Visit: Payer: PRIVATE HEALTH INSURANCE

## 2012-03-04 ENCOUNTER — Other Ambulatory Visit: Payer: PRIVATE HEALTH INSURANCE | Admitting: Lab

## 2012-03-09 ENCOUNTER — Ambulatory Visit (HOSPITAL_BASED_OUTPATIENT_CLINIC_OR_DEPARTMENT_OTHER): Payer: PRIVATE HEALTH INSURANCE

## 2012-03-09 ENCOUNTER — Other Ambulatory Visit (HOSPITAL_BASED_OUTPATIENT_CLINIC_OR_DEPARTMENT_OTHER): Payer: PRIVATE HEALTH INSURANCE | Admitting: Lab

## 2012-03-09 VITALS — BP 138/66 | HR 139 | Temp 97.7°F

## 2012-03-09 DIAGNOSIS — D518 Other vitamin B12 deficiency anemias: Secondary | ICD-10-CM

## 2012-03-09 DIAGNOSIS — N039 Chronic nephritic syndrome with unspecified morphologic changes: Secondary | ICD-10-CM

## 2012-03-09 DIAGNOSIS — N189 Chronic kidney disease, unspecified: Secondary | ICD-10-CM

## 2012-03-09 DIAGNOSIS — D631 Anemia in chronic kidney disease: Secondary | ICD-10-CM

## 2012-03-09 LAB — CBC WITH DIFFERENTIAL/PLATELET
Eosinophils Absolute: 0.3 10*3/uL (ref 0.0–0.5)
MCV: 91.5 fL (ref 79.5–101.0)
MONO#: 0.9 10*3/uL (ref 0.1–0.9)
MONO%: 11.5 % (ref 0.0–14.0)
NEUT#: 5.1 10*3/uL (ref 1.5–6.5)
RBC: 3.64 10*6/uL — ABNORMAL LOW (ref 3.70–5.45)
RDW: 14.8 % — ABNORMAL HIGH (ref 11.2–14.5)
WBC: 7.7 10*3/uL (ref 3.9–10.3)

## 2012-03-09 MED ORDER — CYANOCOBALAMIN 1000 MCG/ML IJ SOLN
1000.0000 ug | Freq: Once | INTRAMUSCULAR | Status: AC
  Administered 2012-03-09: 1000 ug via INTRAMUSCULAR

## 2012-03-09 MED ORDER — DARBEPOETIN ALFA-POLYSORBATE 500 MCG/ML IJ SOLN
200.0000 ug | Freq: Once | INTRAMUSCULAR | Status: AC
  Administered 2012-03-09: 200 ug via SUBCUTANEOUS
  Filled 2012-03-09: qty 1

## 2012-03-18 ENCOUNTER — Ambulatory Visit: Payer: PRIVATE HEALTH INSURANCE

## 2012-03-18 ENCOUNTER — Other Ambulatory Visit: Payer: PRIVATE HEALTH INSURANCE | Admitting: Lab

## 2012-03-22 ENCOUNTER — Other Ambulatory Visit: Payer: Self-pay | Admitting: Medical Oncology

## 2012-03-22 DIAGNOSIS — D518 Other vitamin B12 deficiency anemias: Secondary | ICD-10-CM

## 2012-03-23 ENCOUNTER — Ambulatory Visit: Payer: PRIVATE HEALTH INSURANCE

## 2012-03-23 ENCOUNTER — Other Ambulatory Visit (HOSPITAL_BASED_OUTPATIENT_CLINIC_OR_DEPARTMENT_OTHER): Payer: PRIVATE HEALTH INSURANCE | Admitting: Lab

## 2012-03-23 DIAGNOSIS — N189 Chronic kidney disease, unspecified: Secondary | ICD-10-CM

## 2012-03-23 DIAGNOSIS — D518 Other vitamin B12 deficiency anemias: Secondary | ICD-10-CM

## 2012-03-23 LAB — CBC WITH DIFFERENTIAL/PLATELET
Eosinophils Absolute: 0.4 10*3/uL (ref 0.0–0.5)
LYMPH%: 20.6 % (ref 14.0–49.7)
MONO#: 0.8 10*3/uL (ref 0.1–0.9)
NEUT#: 4.9 10*3/uL (ref 1.5–6.5)
Platelets: 139 10*3/uL — ABNORMAL LOW (ref 145–400)
RBC: 4.19 10*6/uL (ref 3.70–5.45)
WBC: 7.7 10*3/uL (ref 3.9–10.3)
lymph#: 1.6 10*3/uL (ref 0.9–3.3)
nRBC: 0 % (ref 0–0)

## 2012-03-23 MED ORDER — DARBEPOETIN ALFA-POLYSORBATE 500 MCG/ML IJ SOLN: 200.0000 ug | Freq: Once | INTRAMUSCULAR | Status: DC

## 2012-03-29 ENCOUNTER — Ambulatory Visit: Payer: PRIVATE HEALTH INSURANCE | Admitting: Family

## 2012-03-29 ENCOUNTER — Ambulatory Visit: Payer: PRIVATE HEALTH INSURANCE | Admitting: Oncology

## 2012-03-29 ENCOUNTER — Other Ambulatory Visit: Payer: PRIVATE HEALTH INSURANCE | Admitting: Lab

## 2012-04-01 ENCOUNTER — Ambulatory Visit: Payer: PRIVATE HEALTH INSURANCE

## 2012-04-01 ENCOUNTER — Other Ambulatory Visit: Payer: PRIVATE HEALTH INSURANCE | Admitting: Lab

## 2012-04-01 ENCOUNTER — Ambulatory Visit: Payer: PRIVATE HEALTH INSURANCE | Admitting: Family

## 2012-04-05 ENCOUNTER — Other Ambulatory Visit: Payer: Self-pay | Admitting: *Deleted

## 2012-04-05 MED ORDER — LEVOTHYROXINE SODIUM 112 MCG PO TABS: 112.0000 ug | ORAL_TABLET | Freq: Every day | ORAL | Status: DC

## 2012-04-06 ENCOUNTER — Other Ambulatory Visit (HOSPITAL_BASED_OUTPATIENT_CLINIC_OR_DEPARTMENT_OTHER): Payer: PRIVATE HEALTH INSURANCE | Admitting: Lab

## 2012-04-06 ENCOUNTER — Ambulatory Visit (HOSPITAL_BASED_OUTPATIENT_CLINIC_OR_DEPARTMENT_OTHER): Payer: PRIVATE HEALTH INSURANCE | Admitting: Family

## 2012-04-06 ENCOUNTER — Encounter: Payer: Self-pay | Admitting: Family

## 2012-04-06 ENCOUNTER — Ambulatory Visit: Payer: PRIVATE HEALTH INSURANCE

## 2012-04-06 ENCOUNTER — Telehealth: Payer: Self-pay | Admitting: Oncology

## 2012-04-06 VITALS — BP 135/75 | HR 114 | Temp 96.7°F | Resp 18 | Ht 65.0 in | Wt 201.9 lb

## 2012-04-06 DIAGNOSIS — I4891 Unspecified atrial fibrillation: Secondary | ICD-10-CM

## 2012-04-06 DIAGNOSIS — D472 Monoclonal gammopathy: Secondary | ICD-10-CM

## 2012-04-06 DIAGNOSIS — N289 Disorder of kidney and ureter, unspecified: Secondary | ICD-10-CM

## 2012-04-06 DIAGNOSIS — N189 Chronic kidney disease, unspecified: Secondary | ICD-10-CM

## 2012-04-06 DIAGNOSIS — D649 Anemia, unspecified: Secondary | ICD-10-CM

## 2012-04-06 DIAGNOSIS — D518 Other vitamin B12 deficiency anemias: Secondary | ICD-10-CM

## 2012-04-06 DIAGNOSIS — D631 Anemia in chronic kidney disease: Secondary | ICD-10-CM

## 2012-04-06 DIAGNOSIS — E538 Deficiency of other specified B group vitamins: Secondary | ICD-10-CM

## 2012-04-06 LAB — IGG, IGA, IGM
IgA: 626 mg/dL — ABNORMAL HIGH (ref 69–380)
IgM, Serum: 65 mg/dL (ref 52–322)

## 2012-04-06 LAB — COMPREHENSIVE METABOLIC PANEL (CC13)
ALT: 7 U/L (ref 0–55)
AST: 11 U/L (ref 5–34)
Albumin: 3.7 g/dL (ref 3.5–5.0)
Alkaline Phosphatase: 81 U/L (ref 40–150)
BUN: 45 mg/dL — ABNORMAL HIGH (ref 7.0–26.0)
Calcium: 9.2 mg/dL (ref 8.4–10.4)
Chloride: 111 mEq/L — ABNORMAL HIGH (ref 98–107)
Potassium: 4.6 mEq/L (ref 3.5–5.1)
Sodium: 142 mEq/L (ref 136–145)
Total Protein: 7.3 g/dL (ref 6.4–8.3)

## 2012-04-06 LAB — VITAMIN B12: Vitamin B-12: 685 pg/mL (ref 211–911)

## 2012-04-06 LAB — IRON AND TIBC
%SAT: 27 % (ref 20–55)
Iron: 54 ug/dL (ref 42–145)
TIBC: 199 ug/dL — ABNORMAL LOW (ref 250–470)

## 2012-04-06 LAB — CBC WITH DIFFERENTIAL/PLATELET
BASO%: 0.5 % (ref 0.0–2.0)
Basophils Absolute: 0 10*3/uL (ref 0.0–0.1)
EOS%: 3.6 % (ref 0.0–7.0)
Eosinophils Absolute: 0.3 10*3/uL (ref 0.0–0.5)
HCT: 34.6 % — ABNORMAL LOW (ref 34.8–46.6)
HGB: 11.4 g/dL — ABNORMAL LOW (ref 11.6–15.9)
LYMPH%: 19.5 % (ref 14.0–49.7)
MCH: 30.4 pg (ref 25.1–34.0)
MCHC: 32.8 g/dL (ref 31.5–36.0)
MCV: 92.7 fL (ref 79.5–101.0)
MONO#: 0.7 10*3/uL (ref 0.1–0.9)
MONO%: 9.4 % (ref 0.0–14.0)
NEUT#: 4.7 10*3/uL (ref 1.5–6.5)
NEUT%: 67 % (ref 38.4–76.8)
Platelets: 126 10*3/uL — ABNORMAL LOW (ref 145–400)
RBC: 3.74 10*6/uL (ref 3.70–5.45)
RDW: 14.7 % — ABNORMAL HIGH (ref 11.2–14.5)
WBC: 7 10*3/uL (ref 3.9–10.3)
lymph#: 1.4 10*3/uL (ref 0.9–3.3)

## 2012-04-06 LAB — FERRITIN: Ferritin: 350 ng/mL — ABNORMAL HIGH (ref 10–291)

## 2012-04-06 LAB — LACTATE DEHYDROGENASE (CC13): LDH: 164 U/L (ref 125–245)

## 2012-04-06 MED ORDER — DARBEPOETIN ALFA-POLYSORBATE 500 MCG/ML IJ SOLN: 200.0000 ug | Freq: Once | INTRAMUSCULAR | Status: DC

## 2012-04-06 NOTE — Patient Instructions (Addendum)
Please contact us if you have any questions or concerns.

## 2012-04-06 NOTE — Telephone Encounter (Signed)
S/w pt re next appt for 12/17 and mailed remaining schedule. Pt aware.

## 2012-04-06 NOTE — Progress Notes (Signed)
Patient ID: [name], female   DOB: [birth year], 77 y.o.   MRN: [id number] CSN: [id number]  CC: Stacy Harvest, MD  [name]. Stacy Lolling, MD Stacy Mustard, MD  Problem List: [name] is a 77 y.o. African-American female with a problem list consisting of:  1. Anemia most likely due to renal insufficiency  2. Renal insufficiency  3. Atrial fibrillation 4. 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  5. Low vitamin B12 level 6. Osteomyelitis of the left foot status post left mid foot amputation on 05/30/2010  7. Status post amputation of toes of right foot in December 2012 8. Hypertension 9. Diabetes mellitus  10.Venous insufficiency  11.Dyslipidemia 12.Status post total thyroidectomy for multinodular goiter on 03/13/2006.  13.History of asthma 14.Thrombocytopenia with variable platelet count first noted 08/2010, suspect chronic idiopathic thrombocytopenic purpura.  Dr. Arline Mosley and I saw [name] today for follow up of her anemia associated with renal insufficiency.  She was last seen by [place] on 11/17/2011. She is currently taking Aranesp 200 mcg subcutaneous for hemoglobin less than or equal to 11. The patient also receives vitamin B12 injections every 8 weeks. She lives in a senior citizen apartment and ambulates with a walker.  She states since her last office visit, she had an abscess on her right foot that has since resolved.  Her foot abscess was managed by Dr. Lajoyce Mosley. She denies any other intercurrent problems over the past 4 months and her only complaint today is occasional constipation.  We discussed constipation avoidance techniques including hydration, fiber/prune intake and stool softeners/supplements. [name] denies any other symptomatolgy.  She stated she missed some of her lab/injection appointments previously due to transportation issues. Of note, is the fact that the patient has a history of atrial fibrillation and she is not on any  anticoagulation therapy.  Her HR during today's office visit is 114.    Past Medical History: Past Medical History  Diagnosis Date  . Hypertension   . Hyperlipidemia   . Chronic venous insufficiency   . Osteomyelitis     s/p Rt 2nd toe and left 5 toes  amputation in 1/12  by Dr. Lajoyce Mosley  . Diabetes mellitus   . Anemia     b12 def, iron def, follow at cancer center, gets B12 and  another injection there. Could not remeber the name. Dr. Arline Mosley is her  cancer doctor  . Thyroid disease     hypothyroidism h/o hyperthyroidism s/p ablation/ectomy  . Asthma   . Dysrhythmia     atrial fibb  . Shortness of breath   . Blood transfusion     two or more yrs ago  . Hypothyroidism     thyroid removed 4 or more yrs ago  . Atrial fibrillation   . Chronic renal insufficiency     Surgical History: Past Surgical History  Procedure Date  . Eye surgery     cat ext ou  . Appendectomy     teenager  . Amputation 04/30/2011    Procedure: AMPUTATION FOOT;  Surgeon: Stacy Mustard, MD;  Location: [facility] OR;  Service: Orthopedics;  Laterality: Right;  Right Midfoot Amputation    Current Medications: Current Outpatient Prescriptions  Medication Sig Dispense Refill  . docusate sodium (COLACE) 100 MG capsule Take 1 capsule (100 mg total) by mouth 2 (two) times daily as needed.  20 capsule  1  . glipiZIDE (GLUCOTROL) 10 MG tablet TAKE ONE TABLET BY MOUTH  TWICE DAILY BEFORE A MEAL  60 tablet  2  . Lancets 30G MISC Use to check blood sugars as directed  100 each  3  . levothyroxine (SYNTHROID, LEVOTHROID) 112 MCG tablet Take 1 tablet (112 mcg total) by mouth daily.  30 tablet  3  . lisinopril (PRINIVIL,ZESTRIL) 40 MG tablet TAKE ONE TABLET BY MOUTH ONE TIME DAILY  31 tablet  11  . metoprolol tartrate (LOPRESSOR) 50 MG tablet Take 1 tablet (50 mg total) by mouth 2 (two) times daily.  60 tablet  11  . pantoprazole (PROTONIX) 40 MG tablet Take 1 tablet (40 mg total) by mouth daily.  90 tablet  4  . pravastatin  (PRAVACHOL) 20 MG tablet Take 1 tablet (20 mg total) by mouth daily.  30 tablet  11  . sitaGLIPtin (JANUVIA) 100 MG tablet Take 1 tablet (100 mg total) by mouth daily.  30 tablet  11  . [DISCONTINUED] glipiZIDE (GLUCOTROL) 10 MG tablet Take 1 tablet (10 mg total) by mouth 2 (two) times daily.  60 tablet  3   No current facility-administered medications for this visit.   Facility-Administered Medications Ordered in Other Visits  Medication Dose Route Frequency Provider Last Rate Last Dose  . [DISCONTINUED] darbepoetin alfa-polysorbate (ARANESP) injection 200 mcg  200 mcg Subcutaneous Once Stacy Dada, MD        Allergies: No Known Allergies  Family History: Family History  Problem Relation Age of Onset  . Anemia Mother   . HIV Brother   . Cancer Brother     Social History: History  Substance Use Topics  . Smoking status: Never Smoker   . Smokeless tobacco: Never Used  . Alcohol Use: No    Review of Systems: 10 Point review of systems was completed and is negative except as noted above.   Physical Exam:   Blood pressure 135/75, pulse 114, temperature 96.7 F (35.9 C), temperature source Oral, resp. rate 18, height 5\' 5"  (1.651 m), weight 201 lb 14.4 oz (91.581 kg).  General appearance: Alert, cooperative, well nourished, no apparent distress Head: Normocephalic, without obvious abnormality, atraumatic Eyes: Conjunctivae/corneas clear, arcus senilis, PERRLA, EOMI Nose: Nares, septum and mucosa are normal, no drainage or sinus tenderness Neck: No adenopathy, supple, symmetrical, trachea midline, thyroid not enlarged, no tenderness Resp: Clear to auscultation bilaterally Cardio: Irregularly irregular, no murmur, click, rub or gallop GI: Soft, distended, non-tender, hypoactive bowel sounds, no organomegaly, limited exam as the patient is sitting on her rolling walker/stroller Extremities:  Bilateral metatarsal amputations,  all other extremities are normal, atraumatic,  no cyanosis or edema Lymph nodes: Cervical, supraclavicular, and axillary nodes normal Neurologic: Grossly normal   Laboratory Data: Results for orders placed in visit on 04/06/12 (from the past 48 hour(s))  CBC WITH DIFFERENTIAL     Status: Abnormal   Collection Time   04/06/12 10:31 AM      Component Value Range Comment   WBC 7.0  3.9 - 10.3 10e3/uL    NEUT# 4.7  1.5 - 6.5 10e3/uL    HGB 11.4 (*) 11.6 - 15.9 g/dL    HCT 16.1 (*) 09.6 - 46.6 %    Platelets 126 (*) 145 - 400 10e3/uL    MCV 92.7  79.5 - 101.0 fL    MCH 30.4  25.1 - 34.0 pg    MCHC 32.8  31.5 - 36.0 g/dL    RBC 0.45  4.09 - 8.11 10e6/uL    RDW 14.7 (*) 11.2 - 14.5 %  lymph# 1.4  0.9 - 3.3 10e3/uL    MONO# 0.7  0.1 - 0.9 10e3/uL    Eosinophils Absolute 0.3  0.0 - 0.5 10e3/uL    Basophils Absolute 0.0  0.0 - 0.1 10e3/uL    NEUT% 67.0  38.4 - 76.8 %    LYMPH% 19.5  14.0 - 49.7 %    MONO% 9.4  0.0 - 14.0 %    EOS% 3.6  0.0 - 7.0 %    BASO% 0.5  0.0 - 2.0 %   COMPREHENSIVE METABOLIC PANEL (CC13)     Status: Abnormal   Collection Time   04/06/12 10:31 AM      Component Value Range Comment   Sodium 142  136 - 145 mEq/L    Potassium 4.6  3.5 - 5.1 mEq/L    Chloride 111 (*) 98 - 107 mEq/L    CO2 22  22 - 29 mEq/L    Glucose 94  70 - 99 mg/dl    BUN 09.8 (*) 7.0 - 11.9 mg/dL    Creatinine 2.4 (*) 0.6 - 1.1 mg/dL    Total Bilirubin 1.47  0.20 - 1.20 mg/dL    Alkaline Phosphatase 81  40 - 150 U/L    AST 11  5 - 34 U/L    ALT 7  0 - 55 U/L    Total Protein 7.3  6.4 - 8.3 g/dL    Albumin 3.7  3.5 - 5.0 g/dL    Calcium 9.2  8.4 - 82.9 mg/dL   LACTATE DEHYDROGENASE (CC13)     Status: Normal   Collection Time   04/06/12 10:31 AM      Component Value Range Comment   LDH 164  125 - 245 U/L 03/25/12 - NOTE new reference range.     Imaging Studies: 1. Digital screening mammogram on 07/26/2010 was negative.  2. MRI of the right forefoot without IV contrast on 08/20/2010 showed apparent new soft tissue ulceration  distal to the third metatarsal status post prior resection of the third toe. No soft tissue abscess was demonstrated. There was interval resolution of the previously demonstrated marrow edema within the third metatarsal. No evidence for osteomyelitis. There was stable mid foot degenerative changes and chronic myopathic changes.  3. CT of the cervical spine without IV contrast on 10/21/2010 showed degenerative changes in the cervical spine. No displaced fractures were identified.  4. CT of the head without IV contrast on 10/21/2010 showed no evidence for acute intracranial hemorrhage, mass lesion or acute infarct. There was some mild diffuse atrophy. There was a subcutaneous hematoma over the left anterior frontal/periorbital region. No underlying skull fracture or acute intracranial abnormality.  5. Chest x-ray, 2 view, from 04/28/2011 showed stable moderate cardiomegaly with no active lung disease. There was probable mild scarring at the lung bases.   Impression/Plan: [name] seems to be doing well with multiple medical problems. Her most recent dose of Aranesp 200 mcg subcutaneous was given on 03/09/2012 when her hemoglobin was 10.9. At one point back on 08/21/2011 hemoglobin had gotten up to 12.5. Today she does not need Aranesp.   Her last vitamin B12 injection of 1000 mcg IM was on 03/09/2012.  Dr. Arline Mosley changed B12 injections from every 4 weeks to every 8 weeks on 11/27/2011.   [name] has not had a mammogram this year. Once again, this was brought to her attention today. Last mammograms were done on 07/26/2010 and were negative. The patient also has atrial fibrillation. She  is not on anticoagulants. Apparently, she had suffered a fall and some head trauma back in June of 2012.   We will start monitoring CBCs every 4 weeks and giving Aranesp 200 mcg subcutaneous whenever the hemoglobin is less than or equal to 11. The patient will continue with vitamin B12 injections 1000 mcg IM every 8  weeks. We will plan to see her again in 6 months (09/21/2012) at which time we will check CBC, chemistries, vitamin B12 level, iron studies and quantitative immunoglobulins. The iron studies and quantitative immunoglobulins drawn today are pending.  [name] is encouraged to contact us in the interim if she has any questions or concerns.   Larina Bras, NP-C 04/06/2012, 10:39 AM

## 2012-05-04 ENCOUNTER — Other Ambulatory Visit (HOSPITAL_BASED_OUTPATIENT_CLINIC_OR_DEPARTMENT_OTHER): Payer: PRIVATE HEALTH INSURANCE | Admitting: Lab

## 2012-05-04 ENCOUNTER — Ambulatory Visit (HOSPITAL_BASED_OUTPATIENT_CLINIC_OR_DEPARTMENT_OTHER): Payer: PRIVATE HEALTH INSURANCE

## 2012-05-04 VITALS — BP 131/79 | HR 116 | Temp 97.2°F

## 2012-05-04 DIAGNOSIS — D631 Anemia in chronic kidney disease: Secondary | ICD-10-CM

## 2012-05-04 DIAGNOSIS — N039 Chronic nephritic syndrome with unspecified morphologic changes: Secondary | ICD-10-CM

## 2012-05-04 DIAGNOSIS — D518 Other vitamin B12 deficiency anemias: Secondary | ICD-10-CM

## 2012-05-04 DIAGNOSIS — N189 Chronic kidney disease, unspecified: Secondary | ICD-10-CM

## 2012-05-04 LAB — CBC WITH DIFFERENTIAL/PLATELET
BASO%: 0.3 % (ref 0.0–2.0)
Basophils Absolute: 0 10*3/uL (ref 0.0–0.1)
Eosinophils Absolute: 0.2 10*3/uL (ref 0.0–0.5)
HCT: 33.3 % — ABNORMAL LOW (ref 34.8–46.6)
HGB: 10.5 g/dL — ABNORMAL LOW (ref 11.6–15.9)
MONO#: 0.9 10*3/uL (ref 0.1–0.9)
NEUT#: 4.9 10*3/uL (ref 1.5–6.5)
NEUT%: 67.4 % (ref 38.4–76.8)
WBC: 7.3 10*3/uL (ref 3.9–10.3)
lymph#: 1.3 10*3/uL (ref 0.9–3.3)

## 2012-05-04 MED ORDER — DARBEPOETIN ALFA-POLYSORBATE 200 MCG/0.4ML IJ SOLN
200.0000 ug | Freq: Once | INTRAMUSCULAR | Status: AC
  Administered 2012-05-04: 200 ug via SUBCUTANEOUS
  Filled 2012-05-04: qty 0.4

## 2012-05-04 MED ORDER — CYANOCOBALAMIN 1000 MCG/ML IJ SOLN
1000.0000 ug | Freq: Once | INTRAMUSCULAR | Status: AC
  Administered 2012-05-04: 1000 ug via INTRAMUSCULAR

## 2012-05-28 ENCOUNTER — Other Ambulatory Visit: Payer: Self-pay | Admitting: *Deleted

## 2012-05-31 MED ORDER — GLIPIZIDE 10 MG PO TABS: 10.0000 mg | ORAL_TABLET | Freq: Two times a day (BID) | ORAL | Status: DC

## 2012-06-01 ENCOUNTER — Telehealth: Payer: Self-pay | Admitting: *Deleted

## 2012-06-01 ENCOUNTER — Ambulatory Visit: Payer: PRIVATE HEALTH INSURANCE

## 2012-06-01 ENCOUNTER — Other Ambulatory Visit: Payer: PRIVATE HEALTH INSURANCE | Admitting: Lab

## 2012-06-01 NOTE — Telephone Encounter (Signed)
Patient called and canceled appts for today. Patient stated that she will call back to reschedule.  JMW

## 2012-06-28 ENCOUNTER — Encounter: Payer: Self-pay | Admitting: Internal Medicine

## 2012-06-28 ENCOUNTER — Ambulatory Visit (INDEPENDENT_AMBULATORY_CARE_PROVIDER_SITE_OTHER): Payer: PRIVATE HEALTH INSURANCE | Admitting: Internal Medicine

## 2012-06-28 VITALS — BP 130/87 | HR 96 | Temp 96.9°F | Ht 66.0 in | Wt 201.3 lb

## 2012-06-28 DIAGNOSIS — K644 Residual hemorrhoidal skin tags: Secondary | ICD-10-CM | POA: Insufficient documentation

## 2012-06-28 DIAGNOSIS — N289 Disorder of kidney and ureter, unspecified: Secondary | ICD-10-CM

## 2012-06-28 DIAGNOSIS — E119 Type 2 diabetes mellitus without complications: Secondary | ICD-10-CM

## 2012-06-28 DIAGNOSIS — D518 Other vitamin B12 deficiency anemias: Secondary | ICD-10-CM

## 2012-06-28 DIAGNOSIS — E039 Hypothyroidism, unspecified: Secondary | ICD-10-CM

## 2012-06-28 DIAGNOSIS — I10 Essential (primary) hypertension: Secondary | ICD-10-CM

## 2012-06-28 DIAGNOSIS — Z79899 Other long term (current) drug therapy: Secondary | ICD-10-CM

## 2012-06-28 DIAGNOSIS — G4700 Insomnia, unspecified: Secondary | ICD-10-CM

## 2012-06-28 DIAGNOSIS — R400 Somnolence: Secondary | ICD-10-CM

## 2012-06-28 DIAGNOSIS — N259 Disorder resulting from impaired renal tubular function, unspecified: Secondary | ICD-10-CM

## 2012-06-28 DIAGNOSIS — I4891 Unspecified atrial fibrillation: Secondary | ICD-10-CM

## 2012-06-28 DIAGNOSIS — E785 Hyperlipidemia, unspecified: Secondary | ICD-10-CM

## 2012-06-28 LAB — COMPLETE METABOLIC PANEL WITH GFR
Albumin: 4 g/dL (ref 3.5–5.2)
Alkaline Phosphatase: 80 U/L (ref 39–117)
BUN: 46 mg/dL — ABNORMAL HIGH (ref 6–23)
Creat: 2.4 mg/dL — ABNORMAL HIGH (ref 0.50–1.10)
GFR, Est Non African American: 19 mL/min — ABNORMAL LOW
Glucose, Bld: 102 mg/dL — ABNORMAL HIGH (ref 70–99)
Potassium: 4.5 mEq/L (ref 3.5–5.3)

## 2012-06-28 LAB — LIPID PANEL
Cholesterol: 195 mg/dL (ref 0–200)
HDL: 69 mg/dL (ref >39)
Total CHOL/HDL Ratio: 2.8 Ratio
Triglycerides: 69 mg/dL (ref <150)

## 2012-06-28 LAB — CBC
Hemoglobin: 10.9 g/dL — ABNORMAL LOW (ref 12.0–15.0)
MCHC: 32.2 g/dL (ref 30.0–36.0)
RDW: 15.1 % (ref 11.5–15.5)

## 2012-06-28 LAB — TSH: TSH: 0.864 u[IU]/mL (ref 0.350–4.500)

## 2012-06-28 LAB — GLUCOSE, CAPILLARY: Glucose-Capillary: 101 mg/dL — ABNORMAL HIGH (ref 70–99)

## 2012-06-28 MED ORDER — METOPROLOL TARTRATE 50 MG PO TABS: 25.0000 mg | ORAL_TABLET | Freq: Two times a day (BID) | ORAL | Status: DC

## 2012-06-28 NOTE — Assessment & Plan Note (Addendum)
Continue ACE-I, good HTN and DM2 control.   CMET pending for today.   Patient with stable but poor renal function with estimated GFR of 19.  Will discuss with her at next visit re: nephrology consult

## 2012-06-28 NOTE — Assessment & Plan Note (Addendum)
DM is currently well controlled, however, she continues to have new foot wounds.  I encouraged her to follow up with her surgeon for evaluation.   A1C is 6.7 LDL is pending today.  UPDATE: LDL 112, close to goal for this patietn BP is 130/87 Foot exam done today, pulses okay Eye exam due, encouraged her to schedule Renal: CMET pending, she is on an Ace-I.  However, she has renal insufficiency.   Update: BUN/Cr 46/2.4 with GFR of 19.  Consider renal consult if not already being followed.  Will discuss at next visit.

## 2012-06-28 NOTE — Assessment & Plan Note (Signed)
BP well controlled at 130/87.  Continue lisinopril, restart metoprolol

## 2012-06-28 NOTE — Patient Instructions (Addendum)
For your Diabetes:   Please continue to check your blood sugar a few times a week.   Please keep a record of your blood sugars, either in your glucometer or on paper.  Please bring your glucometer to every clinic visit.   Please check your feet at least once weekly to see if you are developing calluses or wounds.   Continue to follow a good diet, including limiting refined sugars and carbohydrates and exercising at least 3 times per week.    Please schedule an eye exam within the next year.   Please schedule a follow up visit within the next 3 months.   For your medications:   Please bring all of your pill  Bottles with you to each visit.  This will help make sure that we have an up to date list of all the medications you are taking.  Please also bring any over the counter herbal medications you are taking (not including advil, tylenol, etc.)  Please continue taking your medications as prescribed.   You will have a new medication called metoprolol.  Please take this medication as prescribed.   Thank you!

## 2012-06-28 NOTE — Assessment & Plan Note (Addendum)
CBC pending for today.   I am concerned that her anemia may be related to a GI pathology.  She reports having a colonoscopy with "polyps" however, there is no record in our system.  She cannot remember where she had the study.  She also has hemocult + and anemia.  She does have hemorrhoids which could explain her symptoms.  She has some loss of stool due to flatus, but rectal tone was adequate.    Consider GI consult for f/u colonoscopy, will discuss with patient once lab work returns.   UPDATE: H/H stabel at 10.9/33.9 with MCV of 88.1.  She is following with oncology for AOCD which does appear to be appropriate.  However, I will call and discuss with patient re: GI consult for repeat colonoscopy.

## 2012-06-28 NOTE — Progress Notes (Signed)
Subjective:    Patient ID: [name], female    DOB: 11/25/1934, 78 y.o.   MRN: [id number]  CC: Follow up for DM  HPI  [name] is a 78yo woman who presents for follow up of her chronic medical issues.  She has a history of DM type 2 which has been well controlled.  She has had multiple amputations, and currently has a non healing ulcer on the plantar surface of her right foot at the 2nd metatarsal.  She states that the ulcer is not painful and that she has recently been seen by her orthopedic surgeon.  There is no drainage, erythema at the site, no rash.  She is walking with a walker and has diabetic shoes.  She si taking [place] and glipizide with good results.  She does not check her sugar regularly at home and did not bring in a glucometer.   She complains of regular daytime sleepiness and falling asleep while talking to people.  She does not drive.  She has a 21/24 score of the ESS (she does not drive).  She states that she has been like this "all her life" however, she notices that it is worse lately.  She goes to sleep about 1am or 2am every morning, however, she denies having trouble falling asleep at night.  She sleeps well after falling asleep, but wakes up ever 1 to 1.5 hours to urinate.  She states that she has not been told she snores or that she stops breathing while sleeping.   She has a history of atrial fibrillation, however, she stopped taking pradaxa and metoprolol.  She stated she stopped the pradaxa because she noticed blood in her stool.  She does not remember taking the metoprolol at all.  Her HR is elevated today, but she denies palpitations, SOB, chest pain.    [name] further reports occasional incontinence of stool and/or urine when she passes flatus.  She also has a "heavy" feeling in her bottom and difficulty sitting on hard surfaces for very long.  She is concerned she has hemorrhoids.    She further has chronic issues with HTN, for which she takes lisinopril; HLD  for which she takes pravastatin and hypothyroidism for which she takes levothyroxine.  Her BP was elevated on first check to 154/87, but on recheck was 130/87.   She is not smoking.     Review of Systems  Constitutional: Negative for chills, activity change, fatigue and unexpected weight change.  HENT: Negative for hearing loss, ear pain and trouble swallowing.   Eyes: Negative for photophobia and visual disturbance.  Respiratory: Negative for cough, chest tightness, shortness of breath and wheezing.   Cardiovascular: Negative for chest pain, palpitations and leg swelling.  Gastrointestinal: Negative for nausea, vomiting, abdominal pain, diarrhea and constipation.       Loss of bowel with flatus  Endocrine: Positive for polyuria. Negative for cold intolerance and heat intolerance.  Genitourinary: Negative for dysuria, urgency, hematuria and difficulty urinating.  Musculoskeletal: Negative for arthralgias.  Skin: Negative for color change, pallor and rash.  Allergic/Immunologic: Negative for food allergies.  Neurological: Negative for dizziness, syncope and light-headedness.       Excessive daytime sleepiness  Hematological: Negative for adenopathy.  Psychiatric/Behavioral: Positive for sleep disturbance. Negative for confusion and decreased concentration.       Objective:   Physical Exam  Constitutional: She is oriented to person, place, and time. She appears well-developed and well-nourished.  Elderly woman, occasionally falling asleep  during interview  HENT:  Head: Normocephalic and atraumatic.  Eyes: Conjunctivae and EOM are normal. Pupils are equal, round, and reactive to light. No scleral icterus.  Neck: Neck supple. No thyromegaly present.  Cardiovascular: Intact distal pulses.  An irregularly irregular rhythm present. Tachycardia present.   Pulses:      Radial pulses are 1+ on the right side, and 1+ on the left side.       Dorsalis pedis pulses are 1+ on the right side, and  1+ on the left side.  Pulmonary/Chest: Effort normal and breath sounds normal. No respiratory distress. She has no wheezes.  Abdominal: Bowel sounds are normal.  Genitourinary: Rectal exam shows external hemorrhoid and tenderness. Rectal exam shows no mass and anal tone normal. Guaiac positive stool.  Musculoskeletal: She exhibits no tenderness.  Non pitting edema to bilateral mid calf  Neurological: She is alert and oriented to person, place, and time.  Skin: Skin is warm and dry. Lesion noted. No erythema.      Labs: CBC, Lipid panel, CMET, TSH pending  A1C: 6.7     Assessment & Plan:  F/U in 3 months for DM.

## 2012-06-28 NOTE — Assessment & Plan Note (Addendum)
Recheck TSH today, this could explain some of her excessive sleepiness.   Continue synthroid.   UPDATE: TSH appropriate at 0.864

## 2012-06-28 NOTE — Assessment & Plan Note (Addendum)
Check TSH and CBC today.   If WNL, will refer to sleep specialist.    UPDATE: She has baseline anemia, though this is stable.  She also has normal TSH.  Will discuss with patient re: referral to sleep specialist at next visit.

## 2012-06-28 NOTE — Assessment & Plan Note (Signed)
Ms. Gleghorn has been off metoprolol and pradaxa.   Will restart metoprolol today due to elevated HR.  Will consider possibly restarting xarelto at next visit.

## 2012-06-28 NOTE — Assessment & Plan Note (Addendum)
Lipid panel pending today.   Continue pravastatin for now.   UPDATE: LDL 112, no change in therapy

## 2012-06-29 ENCOUNTER — Ambulatory Visit (HOSPITAL_BASED_OUTPATIENT_CLINIC_OR_DEPARTMENT_OTHER): Payer: PRIVATE HEALTH INSURANCE

## 2012-06-29 ENCOUNTER — Other Ambulatory Visit (HOSPITAL_BASED_OUTPATIENT_CLINIC_OR_DEPARTMENT_OTHER): Payer: PRIVATE HEALTH INSURANCE | Admitting: Lab

## 2012-06-29 VITALS — BP 154/89 | HR 121 | Temp 97.6°F

## 2012-06-29 DIAGNOSIS — N189 Chronic kidney disease, unspecified: Secondary | ICD-10-CM

## 2012-06-29 DIAGNOSIS — N039 Chronic nephritic syndrome with unspecified morphologic changes: Secondary | ICD-10-CM

## 2012-06-29 DIAGNOSIS — D518 Other vitamin B12 deficiency anemias: Secondary | ICD-10-CM

## 2012-06-29 DIAGNOSIS — D631 Anemia in chronic kidney disease: Secondary | ICD-10-CM

## 2012-06-29 LAB — CBC WITH DIFFERENTIAL/PLATELET
BASO%: 0.3 % (ref 0.0–2.0)
Eosinophils Absolute: 0.3 10*3/uL (ref 0.0–0.5)
HCT: 34.4 % — ABNORMAL LOW (ref 34.8–46.6)
MCHC: 31.4 g/dL — ABNORMAL LOW (ref 31.5–36.0)
MONO#: 0.7 10*3/uL (ref 0.1–0.9)
NEUT#: 4.7 10*3/uL (ref 1.5–6.5)
NEUT%: 61.5 % (ref 38.4–76.8)
RBC: 3.8 10*6/uL (ref 3.70–5.45)
WBC: 7.6 10*3/uL (ref 3.9–10.3)
lymph#: 1.9 10*3/uL (ref 0.9–3.3)
nRBC: 0 % (ref 0–0)

## 2012-06-29 MED ORDER — DARBEPOETIN ALFA-POLYSORBATE 200 MCG/0.4ML IJ SOLN
200.0000 ug | Freq: Once | INTRAMUSCULAR | Status: AC
  Administered 2012-06-29: 200 ug via SUBCUTANEOUS
  Filled 2012-06-29: qty 0.4

## 2012-06-29 MED ORDER — CYANOCOBALAMIN 1000 MCG/ML IJ SOLN
1000.0000 ug | Freq: Once | INTRAMUSCULAR | Status: AC
  Administered 2012-06-29: 1000 ug via INTRAMUSCULAR

## 2012-07-20 ENCOUNTER — Telehealth: Payer: Self-pay | Admitting: Internal Medicine

## 2012-07-20 NOTE — Telephone Encounter (Signed)
Called and spoke with [name], identity confirmed by birthdate.   Discussed her recent labs, which were all stable.  However, discussed with her the results of her + hemocult in the clinic, chronic anemia and need to repeat colonoscopy screening.  She reports having this screening done many years ago here at [facility], but no records are readily attainable.  Will have her scheduled for repeat colonoscopy.

## 2012-07-20 NOTE — Addendum Note (Signed)
Addended by: Debe Coder B on: 07/20/2012 02:38 PM   Modules accepted: Orders

## 2012-07-27 ENCOUNTER — Ambulatory Visit: Payer: PRIVATE HEALTH INSURANCE

## 2012-07-27 ENCOUNTER — Telehealth: Payer: Self-pay | Admitting: *Deleted

## 2012-07-27 ENCOUNTER — Other Ambulatory Visit: Payer: PRIVATE HEALTH INSURANCE | Admitting: Lab

## 2012-07-27 NOTE — Telephone Encounter (Signed)
[name] called to cancel her appointments for today.  She had forgotten and just happen to find her schedule calendar.  She didn't have any power for about two days and can't get out.  She states that she would keep the next appointment in April, just wanted to skip this one.  I tried to encourage her to reschedule for this week, but she didn't want to.  Will let Dr Arline Asp know of this.

## 2012-08-09 ENCOUNTER — Other Ambulatory Visit: Payer: Self-pay | Admitting: *Deleted

## 2012-08-09 MED ORDER — LEVOTHYROXINE SODIUM 112 MCG PO TABS: 112.0000 ug | ORAL_TABLET | Freq: Every day | ORAL | Status: DC

## 2012-08-11 ENCOUNTER — Ambulatory Visit (INDEPENDENT_AMBULATORY_CARE_PROVIDER_SITE_OTHER): Payer: PRIVATE HEALTH INSURANCE | Admitting: Gastroenterology

## 2012-08-11 ENCOUNTER — Encounter: Payer: Self-pay | Admitting: Gastroenterology

## 2012-08-11 VITALS — BP 104/62 | HR 60 | Ht 65.0 in | Wt 195.0 lb

## 2012-08-11 DIAGNOSIS — R195 Other fecal abnormalities: Secondary | ICD-10-CM

## 2012-08-11 DIAGNOSIS — Z86010 Personal history of colonic polyps: Secondary | ICD-10-CM

## 2012-08-11 DIAGNOSIS — D649 Anemia, unspecified: Secondary | ICD-10-CM

## 2012-08-11 NOTE — Patient Instructions (Addendum)
It has been recommended to you by your physician that you have a(n) Colonoscopy completed. Per your request, we did not schedule the procedure(s) today. Please contact our office at [phone] should you decide to have the procedure completed.  You will need a family member to help with your bowel prep at home.    Thank you for choosing Dr. Russella Dar and [facility]

## 2012-08-11 NOTE — Progress Notes (Signed)
History of Present Illness: This is a 78 year old female with multiple comorbidities followed by the internal medicine teaching service who was recently found to have Hemoccult positive stool on rectal examination. She has a chronic anemia felt secondary to renal failure and B12 deficiency followed by Dr.Murinson. She previously underwent colonoscopy in August 2000 which showed a 1 cm tubulovillous adenomas colon polyp which was removed from the sigmoid colon. Her colon was tortuous and spastic and the examination was only complete to the mid ascending colon. The bowel preparation was fair. She was recommended to undergo an air-contrast barium enema and flexible sigmoidoscopy in 5 years but that has not accomplished. She has occasional problems with hemorrhoidal welling. She has occasional fecal incontinence. She has significantly limited mobility and uses a walker. Denies weight loss, abdominal pain, constipation, diarrhea, change in stool caliber, melena, hematochezia, nausea, vomiting, dysphagia, reflux symptoms, chest pain.  Review of Systems: Pertinent positive and negative review of systems were noted in the above HPI section. All other review of systems were otherwise negative.  Current Medications, Allergies, Past Medical History, Past Surgical History, Family History and Social History were reviewed in [employer] record.  Physical Exam: General: Well developed , well nourished, elderly, debilitated, uses a walker, no acute distress Head: Normocephalic and atraumatic Eyes:  sclerae anicteric, EOMI Ears: Normal auditory acuity Mouth: No deformity or lesions Neck: Supple, no masses or thyromegaly Lungs: Clear throughout to auscultation Heart: Regular rate and rhythm; no murmurs, rubs or bruits Abdomen: Soft, non tender and non distended. No masses, hepatosplenomegaly or hernias noted. Normal Bowel sounds Rectal: Recent examination showed external hemorrhoids with  tenderness and Hemoccult positive stool. No other lesions. Musculoskeletal: Symmetrical with no gross deformities  Skin: No lesions on visible extremities Pulses:  Normal pulses noted Extremities: No clubbing, cyanosis, edema or deformities noted Neurological: Alert oriented x 4, grossly nonfocal Cervical Nodes:  No significant cervical adenopathy Inguinal Nodes: No significant inguinal adenopathy Psychological:  Alert and cooperative. Normal mood and affect  Assessment and Recommendations:  1. Hemoccult positive stool. Multifactorial anemia secondary to B12 deficiency and renal failure. Personal history of a TVA colon polyp with a tortuous colon and incomplete colonoscopy in 2000. Colonoscopy was recommended to further evaluate her Hemoccult positive stool and for followup on her history of polyps however this may not be successful to the cecum. She may need an air-contrast barium enema or virtual colonoscopy to complete the evaluation at the colonoscopy is not completely to the cecum. The patient has significant comorbidities and limited mobility which will make a bowel preparation very difficult for her to accomplish. She will require assistance during the entire bowel preparation. The risks, benefits, and alternatives to colonoscopy with possible biopsy and possible polypectomy were discussed with the patient and they consent to proceed.

## 2012-08-17 ENCOUNTER — Other Ambulatory Visit: Payer: Self-pay | Admitting: *Deleted

## 2012-08-17 DIAGNOSIS — E119 Type 2 diabetes mellitus without complications: Secondary | ICD-10-CM

## 2012-08-18 MED ORDER — SITAGLIPTIN PHOSPHATE 50 MG PO TABS: 50.0000 mg | ORAL_TABLET | Freq: Every day | ORAL | Status: DC

## 2012-08-18 NOTE — Telephone Encounter (Signed)
[name] dose should be decreased due to creatinine clearance and GFR.  I called and spoke with [name] regarding this and she is in agreement with the plan.  Rx for januvia 50mg  Vita Barley was e-prescribed for her.

## 2012-08-24 ENCOUNTER — Other Ambulatory Visit (HOSPITAL_BASED_OUTPATIENT_CLINIC_OR_DEPARTMENT_OTHER): Payer: PRIVATE HEALTH INSURANCE | Admitting: Lab

## 2012-08-24 ENCOUNTER — Ambulatory Visit (HOSPITAL_BASED_OUTPATIENT_CLINIC_OR_DEPARTMENT_OTHER): Payer: PRIVATE HEALTH INSURANCE

## 2012-08-24 VITALS — BP 127/82 | HR 117 | Temp 98.3°F

## 2012-08-24 DIAGNOSIS — N189 Chronic kidney disease, unspecified: Secondary | ICD-10-CM

## 2012-08-24 DIAGNOSIS — D631 Anemia in chronic kidney disease: Secondary | ICD-10-CM

## 2012-08-24 DIAGNOSIS — D518 Other vitamin B12 deficiency anemias: Secondary | ICD-10-CM

## 2012-08-24 LAB — CBC WITH DIFFERENTIAL/PLATELET
Basophils Absolute: 0 10*3/uL (ref 0.0–0.1)
Eosinophils Absolute: 0.3 10*3/uL (ref 0.0–0.5)
HCT: 35 % (ref 34.8–46.6)
HGB: 11.1 g/dL — ABNORMAL LOW (ref 11.6–15.9)
NEUT#: 4.3 10*3/uL (ref 1.5–6.5)
NEUT%: 61.7 % (ref 38.4–76.8)
RDW: 14.8 % — ABNORMAL HIGH (ref 11.2–14.5)
lymph#: 1.5 10*3/uL (ref 0.9–3.3)

## 2012-08-24 MED ORDER — CYANOCOBALAMIN 1000 MCG/ML IJ SOLN
1000.0000 ug | Freq: Once | INTRAMUSCULAR | Status: AC
  Administered 2012-08-24: 1000 ug via INTRAMUSCULAR

## 2012-08-24 MED ORDER — DARBEPOETIN ALFA-POLYSORBATE 500 MCG/ML IJ SOLN: 200.0000 ug | Freq: Once | INTRAMUSCULAR | Status: DC

## 2012-09-23 ENCOUNTER — Ambulatory Visit: Payer: PRIVATE HEALTH INSURANCE | Admitting: Oncology

## 2012-09-23 ENCOUNTER — Other Ambulatory Visit: Payer: PRIVATE HEALTH INSURANCE | Admitting: Lab

## 2012-09-23 ENCOUNTER — Ambulatory Visit: Payer: PRIVATE HEALTH INSURANCE

## 2012-09-29 ENCOUNTER — Ambulatory Visit: Payer: PRIVATE HEALTH INSURANCE

## 2012-09-29 ENCOUNTER — Other Ambulatory Visit: Payer: Self-pay

## 2012-09-29 ENCOUNTER — Telehealth: Payer: Self-pay | Admitting: *Deleted

## 2012-09-29 DIAGNOSIS — D631 Anemia in chronic kidney disease: Secondary | ICD-10-CM

## 2012-09-29 MED ORDER — DARBEPOETIN ALFA-POLYSORBATE 500 MCG/ML IJ SOLN: 200.0000 ug | Freq: Once | INTRAMUSCULAR | Status: DC

## 2012-09-29 NOTE — Telephone Encounter (Signed)
Called patient about the fact she doesn't have any appointments until July and that we need to check her labs before then.  Schedules will call her to schedule appointment the last week on May.   She is in agreement with this.  She also knows to call earlier if she feels like she needs to be checked before the end of May.

## 2012-09-30 ENCOUNTER — Ambulatory Visit: Payer: PRIVATE HEALTH INSURANCE

## 2012-09-30 ENCOUNTER — Telehealth: Payer: Self-pay | Admitting: Oncology

## 2012-10-13 ENCOUNTER — Ambulatory Visit (HOSPITAL_BASED_OUTPATIENT_CLINIC_OR_DEPARTMENT_OTHER): Payer: PRIVATE HEALTH INSURANCE

## 2012-10-13 ENCOUNTER — Other Ambulatory Visit (HOSPITAL_BASED_OUTPATIENT_CLINIC_OR_DEPARTMENT_OTHER): Payer: PRIVATE HEALTH INSURANCE | Admitting: Lab

## 2012-10-13 VITALS — BP 128/79 | HR 110 | Temp 98.3°F

## 2012-10-13 DIAGNOSIS — D631 Anemia in chronic kidney disease: Secondary | ICD-10-CM

## 2012-10-13 DIAGNOSIS — N189 Chronic kidney disease, unspecified: Secondary | ICD-10-CM

## 2012-10-13 DIAGNOSIS — D518 Other vitamin B12 deficiency anemias: Secondary | ICD-10-CM

## 2012-10-13 LAB — CBC WITH DIFFERENTIAL/PLATELET
BASO%: 0.6 % (ref 0.0–2.0)
EOS%: 3.9 % (ref 0.0–7.0)
HCT: 33.3 % — ABNORMAL LOW (ref 34.8–46.6)
MCHC: 33 g/dL (ref 31.5–36.0)
MONO#: 0.8 10*3/uL (ref 0.1–0.9)
RBC: 3.78 10*6/uL (ref 3.70–5.45)
RDW: 15.4 % — ABNORMAL HIGH (ref 11.2–14.5)
WBC: 7.9 10*3/uL (ref 3.9–10.3)
lymph#: 1.7 10*3/uL (ref 0.9–3.3)

## 2012-10-13 MED ORDER — CYANOCOBALAMIN 1000 MCG/ML IJ SOLN
1000.0000 ug | Freq: Once | INTRAMUSCULAR | Status: AC
  Administered 2012-10-13: 1000 ug via INTRAMUSCULAR

## 2012-10-13 MED ORDER — DARBEPOETIN ALFA-POLYSORBATE 500 MCG/ML IJ SOLN: 200.0000 ug | Freq: Once | INTRAMUSCULAR | Status: DC

## 2012-10-14 ENCOUNTER — Other Ambulatory Visit: Payer: Self-pay | Admitting: Internal Medicine

## 2012-10-25 ENCOUNTER — Encounter: Payer: Self-pay | Admitting: Internal Medicine

## 2012-10-25 ENCOUNTER — Ambulatory Visit (HOSPITAL_COMMUNITY)
Admission: RE | Admit: 2012-10-25 | Discharge: 2012-10-25 | Disposition: A | Discharge status: Home / Self Care | Payer: PRIVATE HEALTH INSURANCE | Source: Ambulatory Visit | Attending: Internal Medicine | Admitting: Internal Medicine

## 2012-10-25 ENCOUNTER — Ambulatory Visit (INDEPENDENT_AMBULATORY_CARE_PROVIDER_SITE_OTHER): Payer: PRIVATE HEALTH INSURANCE | Admitting: Internal Medicine

## 2012-10-25 VITALS — BP 140/86 | HR 106 | Temp 97.0°F | Ht 66.0 in | Wt 189.5 lb

## 2012-10-25 DIAGNOSIS — R9431 Abnormal electrocardiogram [ECG] [EKG]: Secondary | ICD-10-CM | POA: Insufficient documentation

## 2012-10-25 DIAGNOSIS — I10 Essential (primary) hypertension: Secondary | ICD-10-CM

## 2012-10-25 DIAGNOSIS — E119 Type 2 diabetes mellitus without complications: Secondary | ICD-10-CM

## 2012-10-25 DIAGNOSIS — I4891 Unspecified atrial fibrillation: Secondary | ICD-10-CM

## 2012-10-25 DIAGNOSIS — E785 Hyperlipidemia, unspecified: Secondary | ICD-10-CM

## 2012-10-25 DIAGNOSIS — E559 Vitamin D deficiency, unspecified: Secondary | ICD-10-CM

## 2012-10-25 DIAGNOSIS — N259 Disorder resulting from impaired renal tubular function, unspecified: Secondary | ICD-10-CM

## 2012-10-25 LAB — COMPLETE METABOLIC PANEL WITH GFR
ALT: 9 U/L (ref 0–35)
AST: 14 U/L (ref 0–37)
Alkaline Phosphatase: 90 U/L (ref 39–117)
Creat: 2.24 mg/dL — ABNORMAL HIGH (ref 0.50–1.10)
GFR, Est African American: 24 mL/min — ABNORMAL LOW
Sodium: 139 mEq/L (ref 135–145)
Total Bilirubin: 0.5 mg/dL (ref 0.3–1.2)

## 2012-10-25 LAB — GLUCOSE, CAPILLARY: Glucose-Capillary: 89 mg/dL (ref 70–99)

## 2012-10-25 LAB — LIPID PANEL
HDL: 67 mg/dL (ref >39)
LDL Cholesterol: 104 mg/dL — ABNORMAL HIGH (ref 0–99)
Total CHOL/HDL Ratio: 2.7 Ratio
Triglycerides: 65 mg/dL (ref <150)
VLDL: 13 mg/dL (ref 0–40)

## 2012-10-25 LAB — POCT GLYCOSYLATED HEMOGLOBIN (HGB A1C): Hemoglobin A1C: 6.8

## 2012-10-25 MED ORDER — RIVAROXABAN 15 MG PO TABS: 15.0000 mg | ORAL_TABLET | Freq: Every day | ORAL | Status: DC

## 2012-10-25 NOTE — Progress Notes (Signed)
Subjective:    Patient ID: [name], female    DOB: 03-26-1935, 78 y.o.   MRN: [id number]  Follow up for chronic health issues  HPI  Ms. Gelardi came 30 minutes late to her appointment.    Atrial Fibrillation: EKG today revealed - atrial fibrillation with a rate of 100 She has been off xarelto for a while; unclear reasons as to why she stopped.  Restarted metoprolol at last visit - she only took for a short while, and then became confused with her medications and stopped taking it.   CHADS2 score of 3, HAS BLED score of at least 4.  Risk of clot high, would recommend restarting xarelto.   Diabetes:  Eye exam due - Sees Dr. Elmer Picker here in [place].  Only checks her BS once a week.  Sometimes in the 100s.  She is unclear about the numbers.  Highest was close to 200.  She has had no low blood sugars.   Blood pressure medications: She is taking her old ones, and is not clear on which ones she is supposed to take.  She brings with her lisinopril and metoprolol.  She states that she is only taking lisinopril.    Her sleep is somewhat improved.  She still doses off after she eats and sleeps pretty good when she does go to bed.   Lives by herself.  She has a homecare aide come for 2 hours a day to help around the house.  No smoking.  She is around 2nd hand smoke when she goes to bingo.    Review of Systems  Constitutional: Positive for fatigue (occasional, gets a shot for anemia and she feels better). Negative for fever, chills and diaphoresis.  HENT: Positive for congestion (from the air conditioning). Negative for hearing loss, ear pain, sore throat, trouble swallowing, dental problem and tinnitus.   Eyes: Negative for pain and visual disturbance.  Respiratory: Negative for cough, choking, chest tightness and shortness of breath.   Cardiovascular: Negative for chest pain, palpitations and leg swelling.  Gastrointestinal: Negative for abdominal pain, diarrhea, constipation (mild, trying  dietary changes) and blood in stool.  Genitourinary: Negative for dysuria and difficulty urinating.       Decreased urination and + urgency  Musculoskeletal: Positive for gait problem (due to toe amputations, uses a walker). Negative for back pain and arthralgias.  Skin: Negative for color change, pallor (+ areas of white spots at hairline, lips and waist), rash and wound.       + healed skin over site of amputations, + corn at right lateral border of right foot  Neurological: Negative for dizziness, syncope and light-headedness.  Psychiatric/Behavioral: Negative for confusion and decreased concentration.       Objective:   Physical Exam  Constitutional: She is oriented to person, place, and time. She appears well-developed and well-nourished.  Sleepy during exam  HENT:  Head: Normocephalic and atraumatic.  Mouth/Throat: No oropharyngeal exudate.  Eyes: Conjunctivae are normal. Pupils are equal, round, and reactive to light. No scleral icterus.  Cardiovascular: S1 normal, S2 normal and intact distal pulses.  An irregularly irregular rhythm present. Tachycardia present.  Exam reveals no friction rub.   Pulmonary/Chest: Effort normal and breath sounds normal. No respiratory distress. She has no wheezes.  Abdominal: Soft. Bowel sounds are normal. There is no tenderness.  Musculoskeletal: She exhibits no edema.       Feet:  Neurological: She is alert and oriented to person, place, and time.  Skin:  Skin is warm and dry. No rash noted. No erythema.  Psychiatric: She has a normal mood and affect. Her behavior is normal.    A1C: 6.8      Assessment & Plan:  RTC in 3 months, sooner if necessary.

## 2012-10-25 NOTE — Patient Instructions (Addendum)
General Instructions: Please schedule a follow up visit in 2 months, sooner if needed.   Below is a detailed list of the medications you should be taking -   Januvia (sitagliptan) - For your diabetes, take daily Glipizide - For your diabetes, take daily Lisinopril - For your blood pressure, take daily Metoprolol - for you atrial fibrillation (abnromal heart beat), take twice a day Levothyroxine - For your thyroid, take daily Pantoprazole - for your reflux/heartburn, take daily  NEW Xarelto - For your atrial fibrillation; this medication thins your blood but will help protect you from having a stroke.  Take 15mg  daily.   For your kidney function (chronic kidney disease) I will place a consult for you to see the kidney specialist (nephrologist). Please follow up with this doctor.    If you do decide to have a colonoscopy, please remember to tell the GI doctor that you are on the blood thinner xarelto.  You will need to stop it for a short time to have the procedure.   Please call the clinic if you should have any questions.   Thank you.    Treatment Goals:  Goals (1 Years of Data) as of 10/25/12   None      Progress Toward Treatment Goals:  Treatment Goal 10/25/2012  Hemoglobin A1C at goal  Blood pressure unchanged    Self Care Goals & Plans:  Self Care Goal 06/28/2012  Manage my medications take my medicines as prescribed; bring my medications to every visit  Monitor my health bring my glucose meter and log to each visit    Home Blood Glucose Monitoring 10/25/2012  Check my blood sugar (No Data)  When to check my blood sugar before breakfast     Care Management & Community Referrals:  Referral 10/25/2012  Referrals made for care management support none needed

## 2012-10-25 NOTE — Assessment & Plan Note (Signed)
BP Readings from Last 3 Encounters:  10/25/12 140/86  10/13/12 128/79  08/24/12 127/82    Lab Results  Component Value Date   NA 139 06/28/2012   K 4.5 06/28/2012   CREATININE 2.40* 06/28/2012    Assessment: Blood pressure control: mildly elevated Progress toward BP goal:  deteriorated Comments: She has not been taking metoprolol  Plan: Medications:  continue current medications, restart metoprolol Educational resources provided:   Self management tools provided:   Other plans: see back in 2 months, CMP today to assess renal function

## 2012-10-25 NOTE — Assessment & Plan Note (Signed)
Lab Results  Component Value Date   HGBA1C 6.8 10/25/2012   HGBA1C 6.7 06/28/2012   HGBA1C 6.5 07/07/2011     Assessment: Diabetes control: good control (HgbA1C at goal) Progress toward A1C goal:  at goal Comments: She is at goal, but has the history of neuropathy and previous amputations without any toes on either foot anymore.   Plan: Medications:  continue current medications Home glucose monitoring: Frequency:  (once weekly) Timing: before breakfast Instruction/counseling given: reminded to get eye exam, reminded to bring blood glucose meter & log to each visit, reminded to bring medications to each visit and discussed diet Educational resources provided:   Self management tools provided:   Other plans: Will see her back in 2 months for her Afib.   Referral to nephrologist today for her kidney insufficiency, LDL previously 112, recheck today as she is no longer taking her statin.  BP 140/86 on recheck which is okay, likely better with initiation of metoprolol, Eye exam due and I encouraged her to make an appointment.

## 2012-10-25 NOTE — Assessment & Plan Note (Addendum)
EKG today showed atrial fibrillation with a rate of 100.  She has not been taking her metoprolol.  Have stressed the importance of taking that medication and made it clear on her AVS as what she is supposed to take.  Will see her in 2 months and uptitrate as necessary for goal HR.  Have also advised her to start Xarelto back, 15mg  Daily taking in to account her renal insufficiency.  I explained to her the need for the medication and the risks associated with taking it.

## 2012-10-25 NOTE — Assessment & Plan Note (Signed)
She stopped taking her statin because of cramps in her hands.   Recheck lipid profile today, if still elevated will discuss other options for control of her cholesterol given her DM and stroke risk.

## 2012-10-25 NOTE — Assessment & Plan Note (Addendum)
Repeat CMP today for confirmation of renal function (stable most recently).   PTH, phosphorus, vitamin D.    Renal referral.   UPDATE: PTH mishandled by lab, no result.  Vitamin D is low, will replete.

## 2012-10-26 LAB — PTH, INTACT AND CALCIUM

## 2012-11-01 ENCOUNTER — Telehealth: Payer: Self-pay | Admitting: *Deleted

## 2012-11-01 NOTE — Telephone Encounter (Signed)
error 

## 2012-11-03 ENCOUNTER — Encounter: Payer: Self-pay | Admitting: Internal Medicine

## 2012-11-03 ENCOUNTER — Other Ambulatory Visit: Payer: Self-pay | Admitting: Internal Medicine

## 2012-11-03 DIAGNOSIS — E559 Vitamin D deficiency, unspecified: Secondary | ICD-10-CM

## 2012-11-03 MED ORDER — ERGOCALCIFEROL 8000 UNIT/ML PO SOLN: 8000.0000 [IU] | ORAL | Status: DC

## 2012-11-03 NOTE — Assessment & Plan Note (Signed)
Will replete with ergocalciferol.

## 2012-11-15 ENCOUNTER — Telehealth: Payer: Self-pay | Admitting: *Deleted

## 2012-11-15 NOTE — Telephone Encounter (Signed)
Contacted pt's insurance at [phone] after receiving PA request from pt's pharmacy.  Pt has A-Fib, request was approved until 11/15/2013. Pharmacy made aware, phone call complete.Criss Alvine, Carmela Piechowski Cassady6/30/20149:31 AM

## 2012-11-16 ENCOUNTER — Telehealth: Payer: Self-pay | Admitting: Oncology

## 2012-11-16 ENCOUNTER — Other Ambulatory Visit: Payer: Self-pay | Admitting: Internal Medicine

## 2012-11-16 ENCOUNTER — Other Ambulatory Visit: Payer: PRIVATE HEALTH INSURANCE | Admitting: Lab

## 2012-11-16 ENCOUNTER — Ambulatory Visit: Payer: PRIVATE HEALTH INSURANCE | Admitting: Oncology

## 2012-11-16 ENCOUNTER — Ambulatory Visit (HOSPITAL_BASED_OUTPATIENT_CLINIC_OR_DEPARTMENT_OTHER): Payer: PRIVATE HEALTH INSURANCE | Admitting: Physician Assistant

## 2012-11-16 ENCOUNTER — Ambulatory Visit (HOSPITAL_BASED_OUTPATIENT_CLINIC_OR_DEPARTMENT_OTHER): Payer: PRIVATE HEALTH INSURANCE

## 2012-11-16 ENCOUNTER — Other Ambulatory Visit (HOSPITAL_BASED_OUTPATIENT_CLINIC_OR_DEPARTMENT_OTHER): Payer: PRIVATE HEALTH INSURANCE | Admitting: Lab

## 2012-11-16 ENCOUNTER — Telehealth: Payer: Self-pay | Admitting: Internal Medicine

## 2012-11-16 VITALS — BP 145/78 | HR 48 | Temp 98.0°F | Resp 18 | Ht 66.0 in | Wt 189.7 lb

## 2012-11-16 DIAGNOSIS — D518 Other vitamin B12 deficiency anemias: Secondary | ICD-10-CM

## 2012-11-16 DIAGNOSIS — N189 Chronic kidney disease, unspecified: Secondary | ICD-10-CM

## 2012-11-16 DIAGNOSIS — N289 Disorder of kidney and ureter, unspecified: Secondary | ICD-10-CM

## 2012-11-16 DIAGNOSIS — D631 Anemia in chronic kidney disease: Secondary | ICD-10-CM

## 2012-11-16 DIAGNOSIS — I4891 Unspecified atrial fibrillation: Secondary | ICD-10-CM

## 2012-11-16 DIAGNOSIS — D472 Monoclonal gammopathy: Secondary | ICD-10-CM

## 2012-11-16 LAB — COMPREHENSIVE METABOLIC PANEL (CC13)
ALT: <6 U/L (ref 0–55)
Albumin: 3.6 g/dL (ref 3.5–5.0)
CO2: 23 mEq/L (ref 22–29)
Calcium: 8.8 mg/dL (ref 8.4–10.4)
Chloride: 109 mEq/L (ref 98–109)
Sodium: 142 mEq/L (ref 136–145)
Total Protein: 7.6 g/dL (ref 6.4–8.3)

## 2012-11-16 LAB — CBC WITH DIFFERENTIAL/PLATELET
Basophils Absolute: 0 10*3/uL (ref 0.0–0.1)
EOS%: 2.9 % (ref 0.0–7.0)
HCT: 32.2 % — ABNORMAL LOW (ref 34.8–46.6)
HGB: 10.6 g/dL — ABNORMAL LOW (ref 11.6–15.9)
LYMPH%: 19 % (ref 14.0–49.7)
MCH: 28.9 pg (ref 25.1–34.0)
NEUT%: 67.2 % (ref 38.4–76.8)
Platelets: 123 10*3/uL — ABNORMAL LOW (ref 145–400)
lymph#: 1.2 10*3/uL (ref 0.9–3.3)

## 2012-11-16 LAB — IGG, IGA, IGM
IgA: 547 mg/dL — ABNORMAL HIGH (ref 69–380)
IgM, Serum: 56 mg/dL (ref 52–322)

## 2012-11-16 LAB — IRON AND TIBC CHCC: %SAT: 23 % (ref 21–57)

## 2012-11-16 LAB — FERRITIN CHCC: Ferritin: 340 ng/ml — ABNORMAL HIGH (ref 9–269)

## 2012-11-16 MED ORDER — DARBEPOETIN ALFA-POLYSORBATE 200 MCG/0.4ML IJ SOLN
200.0000 ug | Freq: Once | INTRAMUSCULAR | Status: AC
  Administered 2012-11-16: 200 ug via SUBCUTANEOUS
  Filled 2012-11-16: qty 0.4

## 2012-11-16 NOTE — Telephone Encounter (Signed)
gv and printed appt sched and avs for pt  °

## 2012-11-16 NOTE — Telephone Encounter (Signed)
Attempted to call [name] to remind her to get an eye appointment and to check if she had picked up Rx of Vitamin D.   Answering machine had nonspecific message, asked [name] to call the clinic.

## 2012-11-16 NOTE — Patient Instructions (Addendum)
You are being referred back to your primary care provider regarding starting an alternative anticoagulation therapy for your atrial fibrillation as the Xarelto is not covered by your insurance Follow up in 6 months

## 2012-11-16 NOTE — Telephone Encounter (Signed)
Pt called [facility] will sch eye appt and will check at pharmacy on Vit D. Stanton Kidney Tramond Slinker RN 11/16/12 4:30PM

## 2012-11-17 ENCOUNTER — Other Ambulatory Visit: Payer: Self-pay | Admitting: *Deleted

## 2012-11-17 NOTE — Telephone Encounter (Signed)
This is an old order, her lisinopril has been refilled in May with 6 refills.  She should call that one in or call the pharmacy to see if they have it ready for her.   Thanks

## 2012-11-18 MED ORDER — GLIPIZIDE 10 MG PO TABS: 10.0000 mg | ORAL_TABLET | Freq: Two times a day (BID) | ORAL | Status: DC

## 2012-11-18 NOTE — Progress Notes (Signed)
Patient ID: [name], female   DOB: [birth year], 77 y.o.   MRN: [id number] CSN: [id number]  CC: Stacy Harvest, MD  [name]. Stacy Lolling, MD Stacy Mustard, MD  Problem List: [name] is a 78 y.o. African-American female with a problem list consisting of:  1. Anemia most likely due to renal insufficiency  2. Renal insufficiency  3. Atrial fibrillation 4. 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  5. Low vitamin B12 level 6. Osteomyelitis of the left foot status post left mid foot amputation on 05/30/2010  7. Status post amputation of toes of right foot in December 2012 8. Hypertension 9. Diabetes mellitus  10.Venous insufficiency  11.Dyslipidemia 12.Status post total thyroidectomy for multinodular goiter on 03/13/2006.  13.History of asthma 14.Thrombocytopenia with variable platelet count first noted 08/2010, suspect chronic idiopathic thrombocytopenic purpura.  Dr. Arline Mosley and I saw [name] for follow up of her anemia associated with renal insufficiency.  She was last seen by [place] on 04/06/2012. She is currently taking Aranesp 200 mcg subcutaneous for hemoglobin less than or equal to 11. The patient also receives vitamin B12 injections every 8 weeks. She reports that her appetite is good although she does have some occasional issues with constipation and had a recent hemorrhoid flare. All this is much better now. She states that her [facility] Dr. Debe Mosley is planning for her to have a colonoscopy through [facility]. The specific date for the study has not yet been set. [name] has a history of atrial fibrillation and per her chart had been started on Xarelto. [name] states that her insurance did not cover the Xarelto and she never started this medication. She denied any current bleeding or bruising. She has noticed some areas of skin lightening on her 4 head and around  Mosley of her mouth. She states that her primary care physician is aware of these areas. Of note, is the fact that the patient has a history of atrial fibrillation and she is not on any anticoagulation therapy.  Her HR during [name] office visit is 100.    Past Medical History: Past Medical History  Diagnosis Date  . Hypertension   . Hyperlipidemia   . Chronic venous insufficiency   . Osteomyelitis     s/p Rt 2nd toe and left 5 toes  amputation in 1/12  by Dr. Lajoyce Mosley  . Diabetes mellitus   . Anemia     b12 def, iron def, follow at cancer center, gets B12 and  another injection there. Could not remeber the name. Dr. Arline Mosley is her  cancer doctor  . Thyroid disease     hypothyroidism h/o hyperthyroidism s/p ablation/ectomy  . Asthma   . Dysrhythmia     atrial fibb  . Shortness of breath   . Blood transfusion     two or more yrs ago  . Hypothyroidism     thyroid removed 4 or more yrs ago  . Atrial fibrillation   . Chronic renal insufficiency     Surgical History: Past Surgical History  Procedure Laterality Date  . Eye surgery      cat ext ou  . Appendectomy      teenager  . Amputation  04/30/2011    Procedure: AMPUTATION FOOT;  Surgeon: Stacy Mustard, MD;  Location: [facility] OR;  Service: Orthopedics;  Laterality: Right;  Right Midfoot Amputation  Current Medications: Current Outpatient Prescriptions  Medication Sig Dispense Refill  . ergocalciferol (DRISDOL) 8000 UNIT/ML drops Take 1 mL (8,000 Units total) by mouth once a week.  60 mL  0  . glipiZIDE (GLUCOTROL) 10 MG tablet Take 1 tablet (10 mg total) by mouth 2 (two) times daily before a meal.  180 tablet  3  . Lancets 30G MISC Use to check blood sugars as directed  100 each  3  . levothyroxine (SYNTHROID, LEVOTHROID) 112 MCG tablet Take 1 tablet (112 mcg total) by mouth daily.  30 tablet  3  . lisinopril (PRINIVIL,ZESTRIL) 40 MG tablet TAKE ONE TABLET BY MOUTH ONE TIME DAILY  30 tablet  6  . metoprolol (LOPRESSOR) 50 MG  tablet Take 0.5 tablets (25 mg total) by mouth 2 (two) times daily.  60 tablet  11  . pantoprazole (PROTONIX) 40 MG tablet Take 1 tablet (40 mg total) by mouth daily.  90 tablet  4  . pravastatin (PRAVACHOL) 20 MG tablet Take 1 tablet (20 mg total) by mouth daily.  30 tablet  11  . Rivaroxaban (XARELTO) 15 MG TABS tablet Take 1 tablet (15 mg total) by mouth daily.  30 tablet  3  . sitaGLIPtin (JANUVIA) 50 MG tablet Take 1 tablet (50 mg total) by mouth daily.  30 tablet  2   No current facility-administered medications for this visit.    Allergies: No Known Allergies  Family History: Family History  Problem Relation Age of Onset  . Anemia Mother   . HIV Brother   . Cancer Brother   . Colon cancer Mother     rectal    Social History: History  Substance Use Topics  . Smoking status: Never Smoker   . Smokeless tobacco: Never Used  . Alcohol Use: No    Review of Systems: 10 Point review of systems was completed and is negative except as noted above.   Physical Exam:   Blood pressure 145/78, pulse 48, temperature 98 F (36.7 C), temperature source Oral, resp. rate 18, height 5\' 6"  (1.676 m), weight 189 lb 11.2 oz (86.047 kg), SpO2 100.00%.  General appearance: Alert, cooperative, well nourished, no apparent distress Head: Normocephalic, without obvious abnormality, atraumatic Eyes: Conjunctivae/corneas clear, arcus senilis, PERRLA, EOMI Nose: Nares, septum and mucosa are normal, no drainage or sinus tenderness Neck: No adenopathy, supple, symmetrical, trachea midline, thyroid not enlarged, no tenderness Resp: Clear to auscultation bilaterally Cardio: Irregularly irregular, no murmur, click, rub or gallop GI: Soft, distended, non-tender, hypoactive bowel sounds, no organomegaly, limited exam as the patient is sitting on her rolling walker/stroller Extremities:  Bilateral metatarsal amputations,  all other extremities are normal, atraumatic, no cyanosis or edema Lymph nodes:  Cervical, supraclavicular, and axillary nodes normal Neurologic: Grossly normal Skin: There is some areas of hypopigmentation across the patient's for head near the hairline as well as both Mosley of her mouth extending slightly onto the cheek area.  Laboratory Data: No results found for this or any previous visit (from the past 48 hour(s)).   Imaging Studies: 1. Digital screening mammogram on 07/26/2010 was negative.  2. MRI of the right forefoot without IV contrast on 08/20/2010 showed apparent new soft tissue ulceration distal to the third metatarsal status post prior resection of the third toe. No soft tissue abscess was demonstrated. There was interval resolution of the previously demonstrated marrow edema within the third metatarsal. No evidence for osteomyelitis. There was stable mid foot degenerative changes and chronic myopathic changes.  3.  CT of the cervical spine without IV contrast on 10/21/2010 showed degenerative changes in the cervical spine. No displaced fractures were identified.  4. CT of the head without IV contrast on 10/21/2010 showed no evidence for acute intracranial hemorrhage, mass lesion or acute infarct. There was some mild diffuse atrophy. There was a subcutaneous hematoma over the left anterior frontal/periorbital region. No underlying skull fracture or acute intracranial abnormality.  5. Chest x-ray, 2 view, from 04/28/2011 showed stable moderate cardiomegaly with no active lung disease. There was probable mild scarring at the lung bases.    Impression/Plan: [name] seems to be doing well with multiple medical problems. Her hemoglobin is 10.6 g/dL Mosley and she will require her Aranesp injection Mosley. Patient was discussed with Dr. Arline Mosley. We will   [facility] Debe Mosley for initiation of an alternative anticoagulation therapy for her atrial fibrillation as the Xarelto was not covered by her insurance. She is to  followup here in our office in 6 months.    Her last vitamin B12 injection of 1000 mcg IM was on 10/13/2012.  Dr. Arline Mosley changed B12 injections from every 4 weeks to every 8 weeks on 11/27/2011.   [name] has not had a mammogram this year. Once again, this was brought to her attention Mosley. Last mammograms were done on 07/26/2010 and were negative.   We will continue monitoring CBCs every 4 weeks and giving Aranesp 200 mcg subcutaneous whenever the hemoglobin is less than or equal to 11. The patient will continue with vitamin B12 injections 1000 mcg IM every 8 weeks. We will plan to see her again in 6 months.  [name] is encouraged to contact us in the interim if she has any questions or concerns.   Tiana Loft E, PA-C 11/18/2012, 2:25 PM

## 2012-11-25 ENCOUNTER — Other Ambulatory Visit: Payer: Self-pay | Admitting: *Deleted

## 2012-11-25 ENCOUNTER — Other Ambulatory Visit: Payer: Self-pay

## 2012-11-26 MED ORDER — SITAGLIPTIN PHOSPHATE 25 MG PO TABS: 25.0000 mg | ORAL_TABLET | Freq: Every day | ORAL | Status: DC

## 2012-12-10 ENCOUNTER — Other Ambulatory Visit: Payer: Self-pay | Admitting: *Deleted

## 2012-12-10 MED ORDER — LEVOTHYROXINE SODIUM 112 MCG PO TABS: 112.0000 ug | ORAL_TABLET | Freq: Every day | ORAL | Status: DC

## 2012-12-14 ENCOUNTER — Other Ambulatory Visit (HOSPITAL_BASED_OUTPATIENT_CLINIC_OR_DEPARTMENT_OTHER): Payer: PRIVATE HEALTH INSURANCE | Admitting: Lab

## 2012-12-14 ENCOUNTER — Ambulatory Visit (HOSPITAL_BASED_OUTPATIENT_CLINIC_OR_DEPARTMENT_OTHER): Payer: PRIVATE HEALTH INSURANCE

## 2012-12-14 VITALS — BP 138/83 | HR 103 | Temp 98.1°F

## 2012-12-14 DIAGNOSIS — D518 Other vitamin B12 deficiency anemias: Secondary | ICD-10-CM

## 2012-12-14 DIAGNOSIS — N189 Chronic kidney disease, unspecified: Secondary | ICD-10-CM

## 2012-12-14 DIAGNOSIS — D631 Anemia in chronic kidney disease: Secondary | ICD-10-CM

## 2012-12-14 LAB — CBC WITH DIFFERENTIAL/PLATELET
Basophils Absolute: 0 10*3/uL (ref 0.0–0.1)
EOS%: 3.2 % (ref 0.0–7.0)
HGB: 11.2 g/dL — ABNORMAL LOW (ref 11.6–15.9)
MCH: 29.5 pg (ref 25.1–34.0)
NEUT#: 4.1 10*3/uL (ref 1.5–6.5)
RDW: 14.6 % — ABNORMAL HIGH (ref 11.2–14.5)
lymph#: 1.6 10*3/uL (ref 0.9–3.3)

## 2012-12-14 MED ORDER — CYANOCOBALAMIN 1000 MCG/ML IJ SOLN
1000.0000 ug | Freq: Once | INTRAMUSCULAR | Status: AC
  Administered 2012-12-14: 1000 ug via INTRAMUSCULAR

## 2012-12-14 MED ORDER — DARBEPOETIN ALFA-POLYSORBATE 500 MCG/ML IJ SOLN: 200.0000 ug | Freq: Once | INTRAMUSCULAR | Status: DC

## 2013-01-10 ENCOUNTER — Other Ambulatory Visit: Payer: Self-pay | Admitting: Medical Oncology

## 2013-01-10 DIAGNOSIS — D518 Other vitamin B12 deficiency anemias: Secondary | ICD-10-CM

## 2013-01-11 ENCOUNTER — Other Ambulatory Visit (HOSPITAL_BASED_OUTPATIENT_CLINIC_OR_DEPARTMENT_OTHER): Payer: PRIVATE HEALTH INSURANCE | Admitting: Lab

## 2013-01-11 ENCOUNTER — Ambulatory Visit (HOSPITAL_BASED_OUTPATIENT_CLINIC_OR_DEPARTMENT_OTHER): Payer: PRIVATE HEALTH INSURANCE

## 2013-01-11 VITALS — BP 147/73 | HR 106 | Temp 98.4°F

## 2013-01-11 DIAGNOSIS — D631 Anemia in chronic kidney disease: Secondary | ICD-10-CM

## 2013-01-11 DIAGNOSIS — N189 Chronic kidney disease, unspecified: Secondary | ICD-10-CM

## 2013-01-11 DIAGNOSIS — D518 Other vitamin B12 deficiency anemias: Secondary | ICD-10-CM

## 2013-01-11 LAB — CBC WITH DIFFERENTIAL/PLATELET
Basophils Absolute: 0 10*3/uL (ref 0.0–0.1)
Eosinophils Absolute: 0.3 10*3/uL (ref 0.0–0.5)
HGB: 10.5 g/dL — ABNORMAL LOW (ref 11.6–15.9)
MCV: 90.7 fL (ref 79.5–101.0)
MONO#: 0.6 10*3/uL (ref 0.1–0.9)
MONO%: 9.2 % (ref 0.0–14.0)
NEUT#: 4.1 10*3/uL (ref 1.5–6.5)
RDW: 13.6 % (ref 11.2–14.5)
WBC: 6.4 10*3/uL (ref 3.9–10.3)
lymph#: 1.5 10*3/uL (ref 0.9–3.3)

## 2013-01-11 MED ORDER — DARBEPOETIN ALFA-POLYSORBATE 200 MCG/0.4ML IJ SOLN
200.0000 ug | Freq: Once | INTRAMUSCULAR | Status: AC
  Administered 2013-01-11: 200 ug via SUBCUTANEOUS
  Filled 2013-01-11: qty 0.4

## 2013-01-19 ENCOUNTER — Other Ambulatory Visit: Payer: Self-pay | Admitting: Internal Medicine

## 2013-01-19 NOTE — Telephone Encounter (Signed)
She needs an appointment with me if possible w/i next month.    Thank you

## 2013-01-28 ENCOUNTER — Other Ambulatory Visit: Payer: Self-pay | Admitting: Nephrology

## 2013-02-01 ENCOUNTER — Ambulatory Visit
Admission: RE | Admit: 2013-02-01 | Discharge: 2013-02-01 | Disposition: A | Discharge status: Home / Self Care | Payer: PRIVATE HEALTH INSURANCE | Source: Ambulatory Visit | Attending: Nephrology | Admitting: Nephrology

## 2013-02-07 ENCOUNTER — Other Ambulatory Visit: Payer: Self-pay | Admitting: Medical Oncology

## 2013-02-07 DIAGNOSIS — N189 Chronic kidney disease, unspecified: Secondary | ICD-10-CM

## 2013-02-08 ENCOUNTER — Other Ambulatory Visit (HOSPITAL_BASED_OUTPATIENT_CLINIC_OR_DEPARTMENT_OTHER): Payer: PRIVATE HEALTH INSURANCE | Admitting: Lab

## 2013-02-08 ENCOUNTER — Ambulatory Visit (HOSPITAL_BASED_OUTPATIENT_CLINIC_OR_DEPARTMENT_OTHER): Payer: PRIVATE HEALTH INSURANCE

## 2013-02-08 VITALS — BP 146/70 | HR 102 | Temp 98.2°F

## 2013-02-08 DIAGNOSIS — D631 Anemia in chronic kidney disease: Secondary | ICD-10-CM

## 2013-02-08 DIAGNOSIS — N189 Chronic kidney disease, unspecified: Secondary | ICD-10-CM

## 2013-02-08 DIAGNOSIS — D518 Other vitamin B12 deficiency anemias: Secondary | ICD-10-CM

## 2013-02-08 LAB — CBC WITH DIFFERENTIAL/PLATELET
BASO%: 0.4 % (ref 0.0–2.0)
Basophils Absolute: 0 10e3/uL (ref 0.0–0.1)
EOS%: 4.2 % (ref 0.0–7.0)
Eosinophils Absolute: 0.3 10e3/uL (ref 0.0–0.5)
HCT: 37.1 % (ref 34.8–46.6)
HGB: 11.7 g/dL (ref 11.6–15.9)
LYMPH%: 22 % (ref 14.0–49.7)
MCH: 28.7 pg (ref 25.1–34.0)
MCHC: 31.5 g/dL (ref 31.5–36.0)
MCV: 91.2 fL (ref 79.5–101.0)
MONO#: 0.8 10e3/uL (ref 0.1–0.9)
MONO%: 12.4 % (ref 0.0–14.0)
NEUT#: 4.1 10e3/uL (ref 1.5–6.5)
NEUT%: 61 % (ref 38.4–76.8)
Platelets: 117 10e3/uL — ABNORMAL LOW (ref 145–400)
RBC: 4.07 10e6/uL (ref 3.70–5.45)
RDW: 14 % (ref 11.2–14.5)
WBC: 6.7 10e3/uL (ref 3.9–10.3)
lymph#: 1.5 10e3/uL (ref 0.9–3.3)
nRBC: 0 % (ref 0–0)

## 2013-02-08 MED ORDER — DARBEPOETIN ALFA-POLYSORBATE 500 MCG/ML IJ SOLN: 200.0000 ug | Freq: Once | INTRAMUSCULAR | Status: DC

## 2013-02-08 MED ORDER — CYANOCOBALAMIN 1000 MCG/ML IJ SOLN
1000.0000 ug | Freq: Once | INTRAMUSCULAR | Status: AC
  Administered 2013-02-08: 1000 ug via INTRAMUSCULAR

## 2013-03-07 ENCOUNTER — Other Ambulatory Visit: Payer: Self-pay | Admitting: Medical Oncology

## 2013-03-07 DIAGNOSIS — D518 Other vitamin B12 deficiency anemias: Secondary | ICD-10-CM

## 2013-03-08 ENCOUNTER — Other Ambulatory Visit (HOSPITAL_BASED_OUTPATIENT_CLINIC_OR_DEPARTMENT_OTHER): Payer: PRIVATE HEALTH INSURANCE | Admitting: Lab

## 2013-03-08 ENCOUNTER — Ambulatory Visit: Payer: PRIVATE HEALTH INSURANCE

## 2013-03-08 DIAGNOSIS — D518 Other vitamin B12 deficiency anemias: Secondary | ICD-10-CM

## 2013-03-08 DIAGNOSIS — N189 Chronic kidney disease, unspecified: Secondary | ICD-10-CM

## 2013-03-08 LAB — CBC WITH DIFFERENTIAL/PLATELET
EOS%: 3.8 % (ref 0.0–7.0)
Eosinophils Absolute: 0.3 10*3/uL (ref 0.0–0.5)
MONO#: 0.7 10*3/uL (ref 0.1–0.9)
NEUT#: 4.7 10*3/uL (ref 1.5–6.5)
NEUT%: 61.9 % (ref 38.4–76.8)
RBC: 4.17 10*6/uL (ref 3.70–5.45)
RDW: 14.1 % (ref 11.2–14.5)
WBC: 7.5 10*3/uL (ref 3.9–10.3)
nRBC: 0 % (ref 0–0)

## 2013-03-08 MED ORDER — DARBEPOETIN ALFA-POLYSORBATE 500 MCG/ML IJ SOLN: 200.0000 ug | Freq: Once | INTRAMUSCULAR | Status: DC

## 2013-03-08 MED ORDER — CYANOCOBALAMIN 1000 MCG/ML IJ SOLN: 1000.0000 ug | Freq: Once | INTRAMUSCULAR | Status: DC

## 2013-03-21 ENCOUNTER — Ambulatory Visit: Payer: PRIVATE HEALTH INSURANCE | Admitting: Internal Medicine

## 2013-03-24 ENCOUNTER — Other Ambulatory Visit: Payer: Self-pay

## 2013-04-04 ENCOUNTER — Telehealth: Payer: Self-pay | Admitting: Internal Medicine

## 2013-04-04 NOTE — Telephone Encounter (Signed)
pt called to cx appt...done... °

## 2013-04-05 ENCOUNTER — Ambulatory Visit: Payer: PRIVATE HEALTH INSURANCE

## 2013-04-05 ENCOUNTER — Other Ambulatory Visit: Payer: PRIVATE HEALTH INSURANCE | Admitting: Lab

## 2013-04-11 ENCOUNTER — Other Ambulatory Visit: Payer: Self-pay | Admitting: *Deleted

## 2013-04-12 MED ORDER — LEVOTHYROXINE SODIUM 112 MCG PO TABS: 112.0000 ug | ORAL_TABLET | Freq: Every day | ORAL | Status: DC

## 2013-05-03 ENCOUNTER — Ambulatory Visit (HOSPITAL_BASED_OUTPATIENT_CLINIC_OR_DEPARTMENT_OTHER): Payer: PRIVATE HEALTH INSURANCE | Admitting: Internal Medicine

## 2013-05-03 ENCOUNTER — Ambulatory Visit (HOSPITAL_BASED_OUTPATIENT_CLINIC_OR_DEPARTMENT_OTHER): Payer: PRIVATE HEALTH INSURANCE

## 2013-05-03 ENCOUNTER — Other Ambulatory Visit (HOSPITAL_BASED_OUTPATIENT_CLINIC_OR_DEPARTMENT_OTHER): Payer: PRIVATE HEALTH INSURANCE

## 2013-05-03 ENCOUNTER — Telehealth: Payer: Self-pay | Admitting: Internal Medicine

## 2013-05-03 ENCOUNTER — Other Ambulatory Visit: Payer: Self-pay | Admitting: Internal Medicine

## 2013-05-03 VITALS — BP 147/86 | HR 111 | Temp 98.3°F | Resp 20 | Ht 66.0 in | Wt 191.5 lb

## 2013-05-03 DIAGNOSIS — E538 Deficiency of other specified B group vitamins: Secondary | ICD-10-CM

## 2013-05-03 DIAGNOSIS — I4891 Unspecified atrial fibrillation: Secondary | ICD-10-CM

## 2013-05-03 DIAGNOSIS — N189 Chronic kidney disease, unspecified: Secondary | ICD-10-CM

## 2013-05-03 DIAGNOSIS — D472 Monoclonal gammopathy: Secondary | ICD-10-CM

## 2013-05-03 DIAGNOSIS — E1149 Type 2 diabetes mellitus with other diabetic neurological complication: Secondary | ICD-10-CM

## 2013-05-03 DIAGNOSIS — E1142 Type 2 diabetes mellitus with diabetic polyneuropathy: Secondary | ICD-10-CM

## 2013-05-03 DIAGNOSIS — N184 Chronic kidney disease, stage 4 (severe): Secondary | ICD-10-CM

## 2013-05-03 DIAGNOSIS — D631 Anemia in chronic kidney disease: Secondary | ICD-10-CM

## 2013-05-03 DIAGNOSIS — D518 Other vitamin B12 deficiency anemias: Secondary | ICD-10-CM

## 2013-05-03 DIAGNOSIS — E785 Hyperlipidemia, unspecified: Secondary | ICD-10-CM

## 2013-05-03 DIAGNOSIS — I10 Essential (primary) hypertension: Secondary | ICD-10-CM

## 2013-05-03 DIAGNOSIS — E1140 Type 2 diabetes mellitus with diabetic neuropathy, unspecified: Secondary | ICD-10-CM

## 2013-05-03 LAB — CBC WITH DIFFERENTIAL/PLATELET
BASO%: 0.9 % (ref 0.0–2.0)
Basophils Absolute: 0.1 10*3/uL (ref 0.0–0.1)
HCT: 32.1 % — ABNORMAL LOW (ref 34.8–46.6)
HGB: 10.3 g/dL — ABNORMAL LOW (ref 11.6–15.9)
LYMPH%: 20.1 % (ref 14.0–49.7)
MCV: 90.6 fL (ref 79.5–101.0)
MONO#: 0.7 10*3/uL (ref 0.1–0.9)
NEUT#: 4.7 10*3/uL (ref 1.5–6.5)
NEUT%: 65.1 % (ref 38.4–76.8)
Platelets: 129 10*3/uL — ABNORMAL LOW (ref 145–400)
WBC: 7.2 10*3/uL (ref 3.9–10.3)
lymph#: 1.4 10*3/uL (ref 0.9–3.3)

## 2013-05-03 LAB — COMPREHENSIVE METABOLIC PANEL (CC13)
AST: 13 U/L (ref 5–34)
Albumin: 3.8 g/dL (ref 3.5–5.0)
Anion Gap: 10 mEq/L (ref 3–11)
BUN: 42 mg/dL — ABNORMAL HIGH (ref 7.0–26.0)
CO2: 22 mEq/L (ref 22–29)
Calcium: 9.3 mg/dL (ref 8.4–10.4)
Chloride: 109 mEq/L (ref 98–109)
Creatinine: 2.2 mg/dL — ABNORMAL HIGH (ref 0.6–1.1)
Glucose: 66 mg/dl — ABNORMAL LOW (ref 70–140)
Potassium: 4.6 mEq/L (ref 3.5–5.1)
Sodium: 141 mEq/L (ref 136–145)

## 2013-05-03 LAB — IRON AND TIBC CHCC
%SAT: 26 % (ref 21–57)
Iron: 55 ug/dL (ref 41–142)
TIBC: 208 ug/dL — ABNORMAL LOW (ref 236–444)

## 2013-05-03 LAB — FERRITIN CHCC: Ferritin: 250 ng/ml (ref 9–269)

## 2013-05-03 MED ORDER — CYANOCOBALAMIN 1000 MCG/ML IJ SOLN
1000.0000 ug | Freq: Once | INTRAMUSCULAR | Status: AC
  Administered 2013-05-03: 1000 ug via INTRAMUSCULAR

## 2013-05-03 MED ORDER — DARBEPOETIN ALFA-POLYSORBATE 200 MCG/0.4ML IJ SOLN
200.0000 ug | Freq: Once | INTRAMUSCULAR | Status: AC
  Administered 2013-05-03: 200 ug via SUBCUTANEOUS
  Filled 2013-05-03: qty 0.4

## 2013-05-03 NOTE — Telephone Encounter (Signed)
appts made per 12/16 POF AVs and CAl given shh

## 2013-05-04 LAB — IGG, IGA, IGM: IgG (Immunoglobin G), Serum: 1050 mg/dL (ref 690–1700)

## 2013-05-04 NOTE — Progress Notes (Signed)
[facility] OFFICE PROGRESS NOTE  Debe Coder, MD [address] [address]  DIAGNOSIS: MGUS (monoclonal gammopathy of unknown significance) - Plan: Kappa/lambda light chains, SPEP & IFE with QIG  Anemia associated with chronic renal failure - Plan: CBC with Differential, Lactate dehydrogenase (LDH) - [facility], Comprehensive metabolic panel (Cmet) - [facility], CBC with Differential in 1 month, CBC with Differential in 2 months, CBC with Differential  ANEMIA, VITAMIN B12 DEFICIENCY  Atrial fibrillation  CKD (chronic kidney disease) stage 4, GFR 15-29 ml/min  Diabetes mellitus with neuropathy  HYPERLIPIDEMIA  HYPERTENSION  Vitamin B 12 deficiency  Chief Complaint  Patient presents with  . Anemia associated with chronic renal failure   CURRENT THERAPY: Vitamin B12 shots every 8 weeks and aranesp 200 mcg every 4 weeks for a hemoglobin less than or equal to 11. .  INTERVAL HISTORY: [name] 78 y.o. female with a history of chronic kidney disease complicated by anemia and Vitmain B12 deficiency is here for follow-up.  She was last seen by Conni Slipper on 11/16/2012.  She states that she continues to feel good overall.  She lives alone in a senior citzens complex.  She handles her basic and intermediate ADLs without difficulty.  She admits to stopping her xalreto (on it for atrial fibrillation) because of concerns about her bruising.  She did not request consultation from the prescribing physician.  She denied any bleeding episodes including hematochezia, melana or epitaxis.      MEDICAL HISTORY: Past Medical History  Diagnosis Date  . Hypertension   . Hyperlipidemia   . Chronic venous insufficiency   . Osteomyelitis     s/p Rt 2nd toe and left 5 toes  amputation in 1/12  by Dr. Lajoyce Corners  . Diabetes mellitus   . Anemia     b12 def, iron def, follow at cancer center, gets B12 and  another injection there. Could not remeber the name. Dr. Arline Asp is her   cancer doctor  . Thyroid disease     hypothyroidism h/o hyperthyroidism s/p ablation/ectomy  . Asthma   . Dysrhythmia     atrial fibb  . Shortness of breath   . Blood transfusion     two or more yrs ago  . Hypothyroidism     thyroid removed 4 or more yrs ago  . Atrial fibrillation   . Chronic renal insufficiency     INTERIM HISTORY: has HYPOTHYROIDISM; Diabetes mellitus with neuropathy; HYPERLIPIDEMIA; ANEMIA, VITAMIN B12 DEFICIENCY; HYPERTENSION; VENOUS INSUFFICIENCY; CKD (chronic kidney disease) stage 4, GFR 15-29 ml/min; DYSCHROMIA, UNSPECIFIED; OSTEOMYELITIS, ACUTE, ANKLE/FOOT; OSTEOMYELITIS; EDEMA LEG; CHEST PAIN; STATUS, OTHER TOE(S) AMPUTATION; Atrial fibrillation; Anemia associated with chronic renal failure; MGUS (monoclonal gammopathy of unknown significance); Encounter for long-term (current) use of anticoagulants; External hemorrhoids; Intermittent sleepiness; and Vitamin D deficiency on her problem list.    ALLERGIES:  has No Known Allergies.  MEDICATIONS: has a current medication list which includes the following prescription(s): glipizide, lancets 30g, levothyroxine, lisinopril, metoprolol, pantoprazole, pravastatin, sitagliptin, and rivaroxaban.  SURGICAL HISTORY:  Past Surgical History  Procedure Laterality Date  . Eye surgery      cat ext ou  . Appendectomy      teenager  . Amputation  04/30/2011    Procedure: AMPUTATION FOOT;  Surgeon: Nadara Mustard, MD;  Location: [facility] OR;  Service: Orthopedics;  Laterality: Right;  Right Midfoot Amputation   PROBLEM LIST:  1. Anemia most likely due to renal insufficiency.  2. Renal  insufficiency.  3. Atrial fibrillation.  4. 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  5. Low vitamin B12 level.  6. Osteomyelitis of the left foot status post left mid foot amputation  on 05/30/2010.  7. Status post amputation of toes of right foot in December 2012.  8. Hypertension.  9. Diabetes mellitus.   10.Venous insufficiency.  11.Dyslipidemia.  12.Status post total thyroidectomy for multinodular goiter on  03/13/2006.  13.History of asthma.  14.Thrombocytopenia with variable platelet count first noted 08/2010,  suspect chronic idiopathic thrombocytopenic purpura.   REVIEW OF SYSTEMS:   Constitutional: Denies fevers, chills or abnormal weight loss Eyes: Denies blurriness of vision Ears, nose, mouth, throat, and face: Denies mucositis or sore throat Respiratory: Denies cough, dyspnea or wheezes Cardiovascular: Denies palpitation, chest discomfort or lower extremity swelling Gastrointestinal:  Denies nausea, heartburn or change in bowel habits Skin: Denies abnormal skin rashes Lymphatics: Denies new lymphadenopathy or easy bruising Neurological:Denies numbness, tingling or new weaknesses Behavioral/Psych: Mood is stable, no new changes  All other systems were reviewed with the patient and are negative.  PHYSICAL EXAMINATION: ECOG PERFORMANCE STATUS: 1 - Symptomatic but completely ambulatory  Blood pressure 147/86, pulse 111, temperature 98.3 F (36.8 C), temperature source Oral, resp. rate 20, height 5\' 6"  (1.676 m), weight 191 lb 8 oz (86.864 kg), SpO2 100.00%.  GENERAL:alert, no distress and comfortable; chronically ill elderly female.  SKIN: skin color, texture, turgor are normal, no rashes or significant lesions EYES: normal, Conjunctiva are pink and non-injected, sclera clear OROPHARYNX:no exudate, no erythema and lips, buccal mucosa, and tongue normal  NECK: supple, thyroid normal size, non-tender, without nodularity LYMPH:  no palpable lymphadenopathy in the cervical, axillary or supraclavicular LUNGS: clear to auscultation and percussion with normal breathing effort HEART: irregular rate & rhythm and no murmurs and 1+ lower extremity edema ABDOMEN:abdomen soft, non-tender and normal bowel sounds Musculoskeletal:no cyanosis of digits and no clubbing; s/p bilateral toes  amputations as noted above.  NEURO: alert & oriented x 3 with fluent speech, ambulates via a walker  LABORATORY DATA: Results for orders placed in visit on 05/03/13 (from the past 48 hour(s))  CBC WITH DIFFERENTIAL     Status: Abnormal   Collection Time    05/03/13  9:55 AM      Result Value Range   WBC 7.2  3.9 - 10.3 10e3/uL   NEUT# 4.7  1.5 - 6.5 10e3/uL   HGB 10.3 (*) 11.6 - 15.9 g/dL   HCT 16.1 (*) 09.6 - 04.5 %   Platelets 129 (*) 145 - 400 10e3/uL   MCV 90.6  79.5 - 101.0 fL   MCH 29.1  25.1 - 34.0 pg   MCHC 32.1  31.5 - 36.0 g/dL   RBC 4.09 (*) 8.11 - 9.14 10e6/uL   RDW 15.3 (*) 11.2 - 14.5 %   lymph# 1.4  0.9 - 3.3 10e3/uL   MONO# 0.7  0.1 - 0.9 10e3/uL   Eosinophils Absolute 0.2  0.0 - 0.5 10e3/uL   Basophils Absolute 0.1  0.0 - 0.1 10e3/uL   NEUT% 65.1  38.4 - 76.8 %   LYMPH% 20.1  14.0 - 49.7 %   MONO% 10.4  0.0 - 14.0 %   EOS% 3.5  0.0 - 7.0 %   BASO% 0.9  0.0 - 2.0 %  FERRITIN [facility]     Status: None   Collection Time    05/03/13  9:55 AM      Result Value  Range   Ferritin 250  9 - 269 ng/ml  IRON AND TIBC [facility]     Status: Abnormal   Collection Time    05/03/13  9:55 AM      Result Value Range   Iron 55  41 - 142 ug/dL   TIBC 098 (*) 119 - 147 ug/dL   UIBC 829  562 - 130 ug/dL   %SAT 26  21 - 57 %  COMPREHENSIVE METABOLIC PANEL ([facility])     Status: Abnormal   Collection Time    05/03/13  9:56 AM      Result Value Range   Sodium 141  136 - 145 mEq/L   Potassium 4.6  3.5 - 5.1 mEq/L   Chloride 109  98 - 109 mEq/L   CO2 22  22 - 29 mEq/L   Glucose 66 (*) 70 - 140 mg/dl   BUN 86.5 (*) 7.0 - 78.4 mg/dL   Creatinine 2.2 (*) 0.6 - 1.1 mg/dL   Total Bilirubin 6.96  0.20 - 1.20 mg/dL   Alkaline Phosphatase 83  40 - 150 U/L   AST 13  5 - 34 U/L   ALT 6  0 - 55 U/L   Total Protein 7.5  6.4 - 8.3 g/dL   Albumin 3.8  3.5 - 5.0 g/dL   Calcium 9.3  8.4 - 29.5 mg/dL   Anion Gap 10  3 - 11 mEq/L  IGG, IGA, IGM     Status: Abnormal   Collection Time    05/03/13   9:56 AM      Result Value Range   IgG (Immunoglobin G), Serum 1050  690 - 1700 mg/dL   IgA 284 (*) 69 - 132 mg/dL   IgM, Serum 55  52 - 440 mg/dL       Labs:  Lab Results  Component Value Date   WBC 7.2 05/03/2013   HGB 10.3* 05/03/2013   HCT 32.1* 05/03/2013   MCV 90.6 05/03/2013   PLT 129* 05/03/2013   NEUTROABS 4.7 05/03/2013      Chemistry      Component Value Date/Time   NA 141 05/03/2013 0956   NA 139 10/25/2012 1158   K 4.6 05/03/2013 0956   K 5.2 10/25/2012 1158   CL 109 10/25/2012 1158   CL 111* 04/06/2012 1031   CO2 22 05/03/2013 0956   CO2 20 10/25/2012 1158   BUN 42.0* 05/03/2013 0956   BUN 47* 10/25/2012 1158   CREATININE 2.2* 05/03/2013 0956   CREATININE 2.24* 10/25/2012 1158   CREATININE 2.70* 11/27/2011 0908      Component Value Date/Time   CALCIUM 9.3 05/03/2013 0956   CALCIUM 9.3 10/25/2012 1158   CALCIUM TEST NOT PERFORMED 10/25/2012 1158   ALKPHOS 83 05/03/2013 0956   ALKPHOS 90 10/25/2012 1158   AST 13 05/03/2013 0956   AST 14 10/25/2012 1158   ALT 6 05/03/2013 0956   ALT 9 10/25/2012 1158   BILITOT 0.44 05/03/2013 0956   BILITOT 0.5 10/25/2012 1158      Basic Metabolic Panel:  Recent Labs Lab 05/03/13 0956  NA 141  K 4.6  CO2 22  GLUCOSE 66*  BUN 42.0*  CREATININE 2.2*  CALCIUM 9.3   GFR Estimated Creatinine Clearance: 24.1 ml/min (by C-G formula based on Cr of 2.2). Liver Function Tests:  Recent Labs Lab 05/03/13 0956  AST 13  ALT 6  ALKPHOS 83  BILITOT 0.44  PROT 7.5  ALBUMIN 3.8   CBC:  Recent Labs Lab 05/03/13 0955  WBC 7.2  NEUTROABS 4.7  HGB 10.3*  HCT 32.1*  MCV 90.6  PLT 129*    RADIOGRAPHIC STUDIES: 1. Digital screening mammogram on 07/26/2010 was negative.  2. MRI of the right forefoot without IV contrast on 08/20/2010 showed  apparent new soft tissue ulceration distal to the third metatarsal status post prior resection of the third toe. No soft tissue abscess was demonstrated. There was interval resolution of the  previously demonstrated marrow edema within the third metatarsal. No evidence for osteomyelitis. There was stable mid foot degenerative changes and chronic myopathic changes.  3. CT of the cervical spine without IV contrast on 10/21/2010 showed  degenerative changes in the cervical spine. No displaced fractures  were identified.  4. CT of the head without IV contrast on 10/21/2010 showed no evidence  for acute intracranial hemorrhage, mass lesion or acute infarct. There was some mild diffuse atrophy. There was a subcutaneous hematoma over the left anterior frontal/periorbital region. No underlying skull fracture or acute intracranial abnormality.  5. Chest x-ray, 2 view, from 04/28/2011 showed stable moderate  cardiomegaly with no active lung disease. There was probable mild  scarring at the lung bases.   ASSESSMENT: [name] 78 y.o. female with a history of MGUS (monoclonal gammopathy of unknown significance) - Plan: Kappa/lambda light chains, SPEP & IFE with QIG  Anemia associated with chronic renal failure - Plan: CBC with Differential, Lactate dehydrogenase (LDH) - [facility], Comprehensive metabolic panel (Cmet) - [facility], CBC with Differential in 1 month, CBC with Differential in 2 months, CBC with Differential  ANEMIA, VITAMIN B12 DEFICIENCY  Atrial fibrillation  CKD (chronic kidney disease) stage 4, GFR 15-29 ml/min  Diabetes mellitus with neuropathy  HYPERLIPIDEMIA  HYPERTENSION  Vitamin B 12 deficiency   PLAN:  She continues to do well overall.   1. Anemia of Chronic Kidney Disease. --Based on her hemoglobin of 10.3, she will received her Aranesp 200 mcg.  We will recheck monthly and provide it to her if her hemoglobin in less than 11.  She was  Provided a handout on this medication and its side effects including but not limited to an increase in clots.   2. Vitamin B12 deficiency.   -- Patient was provided a vitamin B12 shot today.  She will continue shots every 8 weeks.    3. Atrial fibrillation. --We counseled her to restart her Kerin Salen.  If she has questions, she should contact Dr. Debe Coder.  She denies any frank bleeding but increase in minor bruising.    4. Follow-up.  --CBCs every 4 weeks.  We will see her back in 4-6 months.  Patient was again reminded to follow up for her Mammogram as soon as possible.    All questions were answered. The patient knows to call the clinic with any problems, questions or concerns. We can certainly see the patient much sooner if necessary.  I spent 15 minutes counseling the patient face to face. The total time spent in the appointment was 25 minutes.    Marquest Gunkel, MD 05/04/2013 4:49 AM

## 2013-05-17 ENCOUNTER — Other Ambulatory Visit: Payer: Self-pay | Admitting: *Deleted

## 2013-05-17 MED ORDER — LISINOPRIL 40 MG PO TABS: ORAL_TABLET | ORAL | Status: DC

## 2013-05-27 ENCOUNTER — Other Ambulatory Visit: Payer: Self-pay | Admitting: *Deleted

## 2013-05-27 MED ORDER — SITAGLIPTIN PHOSPHATE 25 MG PO TABS: 25.0000 mg | ORAL_TABLET | Freq: Every day | ORAL | Status: DC

## 2013-06-02 ENCOUNTER — Other Ambulatory Visit: Payer: PRIVATE HEALTH INSURANCE

## 2013-06-02 ENCOUNTER — Telehealth: Payer: Self-pay | Admitting: Internal Medicine

## 2013-06-02 NOTE — Telephone Encounter (Signed)
returned pt call adn sw pt regarding todays appt...pt request to cx and will call back to r/s

## 2013-07-06 ENCOUNTER — Other Ambulatory Visit: Payer: PRIVATE HEALTH INSURANCE

## 2013-07-07 ENCOUNTER — Telehealth: Payer: Self-pay | Admitting: Internal Medicine

## 2013-07-07 ENCOUNTER — Other Ambulatory Visit: Payer: Self-pay

## 2013-07-07 NOTE — Telephone Encounter (Signed)
S/w the pt and she wants to wait until next month before rescheduling the lab appt due to she has to depend on public transportation pt says has another appt next week and to do alab appt next week is to much for her at one time.

## 2013-07-07 NOTE — Telephone Encounter (Signed)
returned pt call to r/s lab...pt could not r/s at this time she has to ck with caregiver to get d.t. she can come...will call us back

## 2013-07-11 ENCOUNTER — Ambulatory Visit: Payer: PRIVATE HEALTH INSURANCE | Admitting: Internal Medicine

## 2013-07-12 ENCOUNTER — Encounter: Payer: PRIVATE HEALTH INSURANCE | Admitting: Internal Medicine

## 2013-08-03 ENCOUNTER — Other Ambulatory Visit (HOSPITAL_BASED_OUTPATIENT_CLINIC_OR_DEPARTMENT_OTHER): Payer: PRIVATE HEALTH INSURANCE

## 2013-08-03 ENCOUNTER — Ambulatory Visit (HOSPITAL_BASED_OUTPATIENT_CLINIC_OR_DEPARTMENT_OTHER): Payer: PRIVATE HEALTH INSURANCE

## 2013-08-03 VITALS — BP 136/69 | HR 98 | Temp 98.0°F

## 2013-08-03 DIAGNOSIS — E538 Deficiency of other specified B group vitamins: Secondary | ICD-10-CM

## 2013-08-03 DIAGNOSIS — N189 Chronic kidney disease, unspecified: Secondary | ICD-10-CM

## 2013-08-03 DIAGNOSIS — N039 Chronic nephritic syndrome with unspecified morphologic changes: Secondary | ICD-10-CM

## 2013-08-03 DIAGNOSIS — D631 Anemia in chronic kidney disease: Secondary | ICD-10-CM

## 2013-08-03 DIAGNOSIS — D518 Other vitamin B12 deficiency anemias: Secondary | ICD-10-CM

## 2013-08-03 LAB — CBC WITH DIFFERENTIAL/PLATELET
BASO%: 0.9 % (ref 0.0–2.0)
BASOS ABS: 0.1 10*3/uL (ref 0.0–0.1)
EOS%: 6.4 % (ref 0.0–7.0)
Eosinophils Absolute: 0.4 10*3/uL (ref 0.0–0.5)
HCT: 33.8 % — ABNORMAL LOW (ref 34.8–46.6)
HGB: 10.8 g/dL — ABNORMAL LOW (ref 11.6–15.9)
LYMPH%: 23.3 % (ref 14.0–49.7)
MCH: 28.3 pg (ref 25.1–34.0)
MCHC: 32 g/dL (ref 31.5–36.0)
MCV: 88.6 fL (ref 79.5–101.0)
MONO#: 0.8 10*3/uL (ref 0.1–0.9)
MONO%: 11.5 % (ref 0.0–14.0)
NEUT#: 4 10*3/uL (ref 1.5–6.5)
NEUT%: 57.9 % (ref 38.4–76.8)
Platelets: 123 10*3/uL — ABNORMAL LOW (ref 145–400)
RBC: 3.81 10*6/uL (ref 3.70–5.45)
RDW: 15 % — ABNORMAL HIGH (ref 11.2–14.5)
WBC: 7 10*3/uL (ref 3.9–10.3)
lymph#: 1.6 10*3/uL (ref 0.9–3.3)

## 2013-08-03 MED ORDER — CYANOCOBALAMIN 1000 MCG/ML IJ SOLN
1000.0000 ug | Freq: Once | INTRAMUSCULAR | Status: AC
  Administered 2013-08-03: 1000 ug via INTRAMUSCULAR

## 2013-08-03 MED ORDER — DARBEPOETIN ALFA-POLYSORBATE 200 MCG/0.4ML IJ SOLN
200.0000 ug | Freq: Once | INTRAMUSCULAR | Status: AC
Start: 2013-08-03 — End: 2013-08-03
  Administered 2013-08-03: 200 ug via SUBCUTANEOUS
  Filled 2013-08-03: qty 0.4

## 2013-08-25 ENCOUNTER — Other Ambulatory Visit: Payer: Self-pay

## 2013-08-25 ENCOUNTER — Other Ambulatory Visit: Payer: Self-pay | Admitting: *Deleted

## 2013-08-25 DIAGNOSIS — I10 Essential (primary) hypertension: Secondary | ICD-10-CM

## 2013-08-26 ENCOUNTER — Other Ambulatory Visit: Payer: Self-pay | Admitting: *Deleted

## 2013-08-26 DIAGNOSIS — I10 Essential (primary) hypertension: Secondary | ICD-10-CM

## 2013-08-26 MED ORDER — METOPROLOL TARTRATE 50 MG PO TABS: 25.0000 mg | ORAL_TABLET | Freq: Two times a day (BID) | ORAL | Status: DC

## 2013-09-01 ENCOUNTER — Ambulatory Visit: Payer: PRIVATE HEALTH INSURANCE

## 2013-09-01 ENCOUNTER — Other Ambulatory Visit: Payer: PRIVATE HEALTH INSURANCE

## 2013-09-01 ENCOUNTER — Telehealth: Payer: Self-pay | Admitting: Internal Medicine

## 2013-09-01 NOTE — Telephone Encounter (Signed)
pt called and needed to cx and will call back to r/s

## 2013-09-06 ENCOUNTER — Telehealth: Payer: Self-pay | Admitting: Internal Medicine

## 2013-09-06 NOTE — Telephone Encounter (Signed)
pt called to r/s 4/16 appts. pt given new appt for lb/fu/inj 5/6 @ 8:30am.

## 2013-09-21 ENCOUNTER — Other Ambulatory Visit (HOSPITAL_BASED_OUTPATIENT_CLINIC_OR_DEPARTMENT_OTHER): Payer: PRIVATE HEALTH INSURANCE

## 2013-09-21 ENCOUNTER — Encounter: Payer: Self-pay | Admitting: Internal Medicine

## 2013-09-21 ENCOUNTER — Ambulatory Visit (HOSPITAL_BASED_OUTPATIENT_CLINIC_OR_DEPARTMENT_OTHER): Payer: PRIVATE HEALTH INSURANCE

## 2013-09-21 ENCOUNTER — Ambulatory Visit (HOSPITAL_BASED_OUTPATIENT_CLINIC_OR_DEPARTMENT_OTHER): Payer: PRIVATE HEALTH INSURANCE | Admitting: Internal Medicine

## 2013-09-21 VITALS — BP 152/79 | HR 100 | Temp 98.1°F | Resp 18 | Ht 66.0 in | Wt 191.2 lb

## 2013-09-21 DIAGNOSIS — E538 Deficiency of other specified B group vitamins: Secondary | ICD-10-CM

## 2013-09-21 DIAGNOSIS — D631 Anemia in chronic kidney disease: Secondary | ICD-10-CM

## 2013-09-21 DIAGNOSIS — N039 Chronic nephritic syndrome with unspecified morphologic changes: Secondary | ICD-10-CM

## 2013-09-21 DIAGNOSIS — N184 Chronic kidney disease, stage 4 (severe): Secondary | ICD-10-CM

## 2013-09-21 DIAGNOSIS — D518 Other vitamin B12 deficiency anemias: Secondary | ICD-10-CM

## 2013-09-21 DIAGNOSIS — N189 Chronic kidney disease, unspecified: Secondary | ICD-10-CM

## 2013-09-21 DIAGNOSIS — D472 Monoclonal gammopathy: Secondary | ICD-10-CM

## 2013-09-21 DIAGNOSIS — I4891 Unspecified atrial fibrillation: Secondary | ICD-10-CM

## 2013-09-21 LAB — COMPREHENSIVE METABOLIC PANEL (CC13)
ALBUMIN: 3.6 g/dL (ref 3.5–5.0)
ALT: 8 U/L (ref 0–55)
AST: 14 U/L (ref 5–34)
Alkaline Phosphatase: 89 U/L (ref 40–150)
Anion Gap: 12 mEq/L — ABNORMAL HIGH (ref 3–11)
BUN: 57.1 mg/dL — ABNORMAL HIGH (ref 7.0–26.0)
CALCIUM: 8.8 mg/dL (ref 8.4–10.4)
CHLORIDE: 109 meq/L (ref 98–109)
CO2: 21 meq/L — AB (ref 22–29)
CREATININE: 2.6 mg/dL — AB (ref 0.6–1.1)
Glucose: 79 mg/dl (ref 70–140)
POTASSIUM: 4.3 meq/L (ref 3.5–5.1)
Sodium: 142 mEq/L (ref 136–145)
TOTAL PROTEIN: 7.3 g/dL (ref 6.4–8.3)
Total Bilirubin: 0.36 mg/dL (ref 0.20–1.20)

## 2013-09-21 LAB — CBC WITH DIFFERENTIAL/PLATELET
BASO%: 0.4 % (ref 0.0–2.0)
Basophils Absolute: 0 10*3/uL (ref 0.0–0.1)
EOS%: 4.9 % (ref 0.0–7.0)
Eosinophils Absolute: 0.4 10*3/uL (ref 0.0–0.5)
HEMATOCRIT: 34.4 % — AB (ref 34.8–46.6)
HGB: 10.9 g/dL — ABNORMAL LOW (ref 11.6–15.9)
LYMPH#: 1.6 10*3/uL (ref 0.9–3.3)
LYMPH%: 21.9 % (ref 14.0–49.7)
MCH: 28.1 pg (ref 25.1–34.0)
MCHC: 31.6 g/dL (ref 31.5–36.0)
MCV: 88.9 fL (ref 79.5–101.0)
MONO#: 0.8 10*3/uL (ref 0.1–0.9)
MONO%: 10.6 % (ref 0.0–14.0)
NEUT#: 4.7 10*3/uL (ref 1.5–6.5)
NEUT%: 62.2 % (ref 38.4–76.8)
Platelets: 118 10*3/uL — ABNORMAL LOW (ref 145–400)
RBC: 3.87 10*6/uL (ref 3.70–5.45)
RDW: 15.6 % — AB (ref 11.2–14.5)
WBC: 7.5 10*3/uL (ref 3.9–10.3)

## 2013-09-21 LAB — LACTATE DEHYDROGENASE (CC13): LDH: 175 U/L (ref 125–245)

## 2013-09-21 MED ORDER — CYANOCOBALAMIN 1000 MCG/ML IJ SOLN
1000.0000 ug | Freq: Once | INTRAMUSCULAR | Status: AC
  Administered 2013-09-21: 1000 ug via INTRAMUSCULAR

## 2013-09-21 MED ORDER — DARBEPOETIN ALFA-POLYSORBATE 200 MCG/0.4ML IJ SOLN
200.0000 ug | Freq: Once | INTRAMUSCULAR | Status: AC
Start: 2013-09-21 — End: 2013-09-21
  Administered 2013-09-21: 200 ug via SUBCUTANEOUS
  Filled 2013-09-21: qty 0.4

## 2013-09-21 NOTE — Progress Notes (Signed)
[name] OFFICE PROGRESS NOTE  [name], MD [place] [address]  DIAGNOSIS: ANEMIA, VITAMIN B12 DEFICIENCY  Anemia associated with chronic renal failure - Plan: CBC with Differential, Comprehensive metabolic panel (Cmet) - [facility], Lactate dehydrogenase (LDH) - [facility]  CKD (chronic kidney disease) stage 4, GFR 15-29 ml/min - Plan: CBC with Differential, Comprehensive metabolic panel (Cmet) - [facility], Lactate dehydrogenase (LDH) - [facility]  MGUS (monoclonal gammopathy of unknown significance) - Plan: IgG, IgA, IgM, Kappa/lambda light chains  Chief Complaint  Patient presents with  . MGUS (monoclonal gammopathy of unknown significance)   CURRENT THERAPY: Vitamin B12 shots every 8 weeks and aranesp 200 mcg every 4 weeks for a hemoglobin less than or equal to 11. She received aranesp on 08/03/2013, 05/03/2013.    INTERVAL HISTORY: [name] 79 y.o. female with a history of chronic kidney disease complicated by anemia and Vitmain B12 deficiency is here for follow-up.  She was last seen by me on 05/03/2013.  She states that she continues to feel good overall.  She lives alone in a senior citzens complex.  She handles her basic and intermediate ADLs without difficulty.  She admits to stopping her xalreto (on it for atrial fibrillation) because of concerns about her bruising.  She did not request consultation from the prescribing physician.  She denied any bleeding episodes including hematochezia, melana or epitaxis.    She denies any recent hospitalizations or emergency room visits. She had an appointment with Dr. Daryll Drown but had to reschedule it.    MEDICAL HISTORY: Past Medical History  Diagnosis Date  . Hypertension   . Hyperlipidemia   . Chronic venous insufficiency   . Osteomyelitis     s/p Rt 2nd toe and left 5 toes  amputation in 1/12  by Dr. Sharol Given  . Diabetes mellitus   . Anemia     b12 def, iron def, follow at cancer center, gets B12 and  another  injection there. Could not remeber the name. Dr. Ralene Ok is her  cancer doctor  . Thyroid disease     hypothyroidism h/o hyperthyroidism s/p ablation/ectomy  . Asthma   . Dysrhythmia     atrial fibb  . Shortness of breath   . Blood transfusion     two or more yrs ago  . Hypothyroidism     thyroid removed 4 or more yrs ago  . Atrial fibrillation   . Chronic renal insufficiency     INTERIM HISTORY: has HYPOTHYROIDISM; Diabetes mellitus with neuropathy; HYPERLIPIDEMIA; ANEMIA, VITAMIN B12 DEFICIENCY; HYPERTENSION; VENOUS INSUFFICIENCY; CKD (chronic kidney disease) stage 4, GFR 15-29 ml/min; DYSCHROMIA, UNSPECIFIED; OSTEOMYELITIS, ACUTE, ANKLE/FOOT; OSTEOMYELITIS; EDEMA LEG; CHEST PAIN; STATUS, OTHER TOE(S) AMPUTATION; Atrial fibrillation; Anemia associated with chronic renal failure; MGUS (monoclonal gammopathy of unknown significance); Encounter for long-term (current) use of anticoagulants; External hemorrhoids; Intermittent sleepiness; and Vitamin D deficiency on her problem list.    ALLERGIES:  has No Known Allergies.  MEDICATIONS: has a current medication list which includes the following prescription(s): glipizide, lancets 30g, levothyroxine, lisinopril, metoprolol, pantoprazole, pravastatin, rivaroxaban, and sitagliptin.  SURGICAL HISTORY:  Past Surgical History  Procedure Laterality Date  . Eye surgery      cat ext ou  . Appendectomy      teenager  . Amputation  04/30/2011    Procedure: AMPUTATION FOOT;  Surgeon: Newt Minion, MD;  Location: [place];  Service: Orthopedics;  Laterality: Right;  Right Midfoot Amputation   PROBLEM LIST:  1. Anemia  most likely due to renal insufficiency.  2. Renal insufficiency.  3. Atrial fibrillation.  4. 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  5. Low vitamin B12 level.  6. Osteomyelitis of the left foot status post left mid foot amputation  on 05/30/2010.  7. Status post amputation of toes of right  foot in December 2012.  8. Hypertension.  9. Diabetes mellitus.  10.Venous insufficiency.  11.Dyslipidemia.  12.Status post total thyroidectomy for multinodular goiter on  03/13/2006.  13.History of asthma.  14.Thrombocytopenia with variable platelet count first noted 08/2010,  suspect chronic idiopathic thrombocytopenic purpura.   REVIEW OF SYSTEMS:   Constitutional: Denies fevers, chills or abnormal weight loss Eyes: Denies blurriness of vision Ears, nose, mouth, throat, and face: Denies mucositis or sore throat Respiratory: Denies cough, dyspnea or wheezes Cardiovascular: Denies palpitation, chest discomfort or lower extremity swelling Gastrointestinal:  Denies nausea, heartburn or change in bowel habits Skin: Denies abnormal skin rashes Lymphatics: Denies new lymphadenopathy or easy bruising Neurological:Denies numbness, tingling or new weaknesses Behavioral/Psych: Mood is stable, no new changes  All other systems were reviewed with the patient and are negative.  PHYSICAL EXAMINATION: ECOG PERFORMANCE STATUS: 1 - Symptomatic but completely ambulatory  Blood pressure 152/79, pulse 100, temperature 98.1 F (36.7 C), temperature source Oral, resp. rate 18, height 5\' 6"  (1.676 m), weight 191 lb 3.2 oz (86.728 kg), SpO2 100.00%.  GENERAL:alert, no distress and comfortable; chronically ill elderly female who uses a walker.  SKIN: skin color, texture, turgor are normal, no rashes or significant lesions EYES: normal, Conjunctiva are pink and non-injected, sclera clear OROPHARYNX:no exudate, no erythema and lips, buccal mucosa, and tongue normal  NECK: supple, thyroid normal size, non-tender, without nodularity LYMPH:  no palpable lymphadenopathy in the cervical, axillary or supraclavicular LUNGS: clear to auscultation and percussion with normal breathing effort HEART: irregular rate & rhythm and no murmurs and 1+ lower extremity edema ABDOMEN:abdomen soft, non-tender and normal  bowel sounds Musculoskeletal:no cyanosis of digits and no clubbing; s/p bilateral toes amputations as noted above.  NEURO: alert & oriented x 3 with fluent speech, ambulates via a walker  LABORATORY DATA: Results for orders placed in visit on 09/21/13 (from the past 48 hour(s))  CBC WITH DIFFERENTIAL     Status: Abnormal   Collection Time    09/21/13  8:23 AM      Result Value Ref Range   WBC 7.5  3.9 - 10.3 10e3/uL   NEUT# 4.7  1.5 - 6.5 10e3/uL   HGB 10.9 (*) 11.6 - 15.9 g/dL   HCT 34.4 (*) 34.8 - 46.6 %   Platelets 118 (*) 145 - 400 10e3/uL   MCV 88.9  79.5 - 101.0 fL   MCH 28.1  25.1 - 34.0 pg   MCHC 31.6  31.5 - 36.0 g/dL   RBC 3.87  3.70 - 5.45 10e6/uL   RDW 15.6 (*) 11.2 - 14.5 %   lymph# 1.6  0.9 - 3.3 10e3/uL   MONO# 0.8  0.1 - 0.9 10e3/uL   Eosinophils Absolute 0.4  0.0 - 0.5 10e3/uL   Basophils Absolute 0.0  0.0 - 0.1 10e3/uL   NEUT% 62.2  38.4 - 76.8 %   LYMPH% 21.9  14.0 - 49.7 %   MONO% 10.6  0.0 - 14.0 %   EOS% 4.9  0.0 - 7.0 %   BASO% 0.4  0.0 - 2.0 %       Labs:  Lab Results  Component Value  Date   WBC 7.5 09/21/2013   HGB 10.9* 09/21/2013   HCT 34.4* 09/21/2013   MCV 88.9 09/21/2013   PLT 118* 09/21/2013   NEUTROABS 4.7 09/21/2013      Chemistry      Component Value Date/Time   NA 141 05/03/2013 0956   NA 139 10/25/2012 1158   K 4.6 05/03/2013 0956   K 5.2 10/25/2012 1158   CL 109 10/25/2012 1158   CL 111* 04/06/2012 1031   CO2 22 05/03/2013 0956   CO2 20 10/25/2012 1158   BUN 42.0* 05/03/2013 0956   BUN 47* 10/25/2012 1158   CREATININE 2.2* 05/03/2013 0956   CREATININE 2.24* 10/25/2012 1158   CREATININE 2.70* 11/27/2011 0908      Component Value Date/Time   CALCIUM 9.3 05/03/2013 0956   CALCIUM 9.3 10/25/2012 1158   CALCIUM TEST NOT PERFORMED 10/25/2012 1158   ALKPHOS 83 05/03/2013 0956   ALKPHOS 90 10/25/2012 1158   AST 13 05/03/2013 0956   AST 14 10/25/2012 1158   ALT 6 05/03/2013 0956   ALT 9 10/25/2012 1158   BILITOT 0.44 05/03/2013 0956   BILITOT 0.5  10/25/2012 1158      Basic Metabolic Panel: No results found for this basename: NA, K, CL, CO2, GLUCOSE, BUN, CREATININE, CALCIUM, MG, PHOS,  in the last 168 hours GFR Estimated Creatinine Clearance: 24.1 ml/min (by C-G formula based on Cr of 2.2). Liver Function Tests: No results found for this basename: AST, ALT, ALKPHOS, BILITOT, PROT, ALBUMIN,  in the last 168 hours CBC:  Recent Labs Lab 09/21/13 0823  WBC 7.5  NEUTROABS 4.7  HGB 10.9*  HCT 34.4*  MCV 88.9  PLT 118*    RADIOGRAPHIC STUDIES: 1. Digital screening mammogram on 07/26/2010 was negative.  2. MRI of the right forefoot without IV contrast on 08/20/2010 showed  apparent new soft tissue ulceration distal to the third metatarsal status post prior resection of the third toe. No soft tissue abscess was demonstrated. There was interval resolution of the previously demonstrated marrow edema within the third metatarsal. No evidence for osteomyelitis. There was stable mid foot degenerative changes and chronic myopathic changes.  3. CT of the cervical spine without IV contrast on 10/21/2010 showed  degenerative changes in the cervical spine. No displaced fractures  were identified.  4. CT of the head without IV contrast on 10/21/2010 showed no evidence  for acute intracranial hemorrhage, mass lesion or acute infarct. There was some mild diffuse atrophy. There was a subcutaneous hematoma over the left anterior frontal/periorbital region. No underlying skull fracture or acute intracranial abnormality.  5. Chest x-ray, 2 view, from 04/28/2011 showed stable moderate  cardiomegaly with no active lung disease. There was probable mild  scarring at the lung bases.   ASSESSMENT: [name] 79 y.o. female with a history of ANEMIA, VITAMIN B12 DEFICIENCY  Anemia associated with chronic renal failure - Plan: CBC with Differential, Comprehensive metabolic panel (Cmet) - [facility], Lactate dehydrogenase (LDH) - [facility]  CKD (chronic kidney  disease) stage 4, GFR 15-29 ml/min - Plan: CBC with Differential, Comprehensive metabolic panel (Cmet) - [facility], Lactate dehydrogenase (LDH) - [facility]  MGUS (monoclonal gammopathy of unknown significance) - Plan: IgG, IgA, IgM, Kappa/lambda light chains   PLAN:  She continues to do well overall.   1. Anemia of Chronic Kidney Disease. --Based on her hemoglobin of 10.9, she will received her Aranesp 200 mcg.  We will recheck monthly and provide it to her if her hemoglobin in  less than 11.  She was  Provided a handout on this medication and its side effects including but not limited to an increase in clots.   2. Vitamin B12 deficiency.   -- Patient was provided a vitamin B12 shot today.  She will continue shots every 8 weeks.   3. Atrial fibrillation. --We counseled her to restart her Evalina Field. She refuses to use this medicine.  If she has questions, she should contact Dr. [name].  She denies any frank bleeding but increase in minor bruising.    4. Follow-up.  --CBCs every 4 weeks.  We will see her back in 6 months.  Patient was again reminded to follow up for her Mammogram as soon as possible.    All questions were answered. The patient knows to call the clinic with any problems, questions or concerns. We can certainly see the patient much sooner if necessary.  I spent 15 minutes counseling the patient face to face. The total time spent in the appointment was 25 minutes.    Concha Norway, MD 09/21/2013 9:08 AM

## 2013-09-23 LAB — SPEP & IFE WITH QIG
Albumin ELP: 55.6 % — ABNORMAL LOW (ref 55.8–66.1)
Alpha-1-Globulin: 4.7 % (ref 2.9–4.9)
Alpha-2-Globulin: 10.7 % (ref 7.1–11.8)
BETA 2: 5.1 % (ref 3.2–6.5)
BETA GLOBULIN: 9.5 % — AB (ref 4.7–7.2)
GAMMA GLOBULIN: 14.4 % (ref 11.1–18.8)
IGA: 543 mg/dL — AB (ref 69–380)
IGG (IMMUNOGLOBIN G), SERUM: 1110 mg/dL (ref 690–1700)
IgM, Serum: 54 mg/dL (ref 52–322)
M-SPIKE, %: 0.35 g/dL
Total Protein, Serum Electrophoresis: 7.1 g/dL (ref 6.0–8.3)

## 2013-09-23 LAB — KAPPA/LAMBDA LIGHT CHAINS
Kappa free light chain: 30.3 mg/dL — ABNORMAL HIGH (ref 0.33–1.94)
Kappa:Lambda Ratio: 9.62 — ABNORMAL HIGH (ref 0.26–1.65)
LAMBDA FREE LGHT CHN: 3.15 mg/dL — AB (ref 0.57–2.63)

## 2013-09-24 ENCOUNTER — Telehealth: Payer: Self-pay | Admitting: Internal Medicine

## 2013-09-24 NOTE — Telephone Encounter (Signed)
, °

## 2013-10-17 ENCOUNTER — Other Ambulatory Visit: Payer: Self-pay | Admitting: Internal Medicine

## 2013-10-19 ENCOUNTER — Ambulatory Visit (HOSPITAL_BASED_OUTPATIENT_CLINIC_OR_DEPARTMENT_OTHER): Payer: PRIVATE HEALTH INSURANCE

## 2013-10-19 ENCOUNTER — Other Ambulatory Visit: Payer: Self-pay | Admitting: Internal Medicine

## 2013-10-19 ENCOUNTER — Other Ambulatory Visit: Payer: PRIVATE HEALTH INSURANCE

## 2013-10-19 VITALS — BP 149/88 | HR 110 | Temp 98.0°F

## 2013-10-19 DIAGNOSIS — N189 Chronic kidney disease, unspecified: Secondary | ICD-10-CM

## 2013-10-19 DIAGNOSIS — I4891 Unspecified atrial fibrillation: Secondary | ICD-10-CM

## 2013-10-19 DIAGNOSIS — D518 Other vitamin B12 deficiency anemias: Secondary | ICD-10-CM

## 2013-10-19 DIAGNOSIS — E538 Deficiency of other specified B group vitamins: Secondary | ICD-10-CM

## 2013-10-19 DIAGNOSIS — D631 Anemia in chronic kidney disease: Secondary | ICD-10-CM

## 2013-10-19 LAB — CBC WITH DIFFERENTIAL/PLATELET
BASO%: 1 % (ref 0.0–2.0)
BASOS ABS: 0.1 10*3/uL (ref 0.0–0.1)
EOS%: 6.3 % (ref 0.0–7.0)
Eosinophils Absolute: 0.5 10*3/uL (ref 0.0–0.5)
HEMATOCRIT: 39.4 % (ref 34.8–46.6)
HEMOGLOBIN: 12.2 g/dL (ref 11.6–15.9)
LYMPH#: 1.7 10*3/uL (ref 0.9–3.3)
LYMPH%: 21.2 % (ref 14.0–49.7)
MCH: 27.9 pg (ref 25.1–34.0)
MCHC: 30.9 g/dL — ABNORMAL LOW (ref 31.5–36.0)
MCV: 90.5 fL (ref 79.5–101.0)
MONO#: 0.8 10*3/uL (ref 0.1–0.9)
MONO%: 9.7 % (ref 0.0–14.0)
NEUT#: 5 10*3/uL (ref 1.5–6.5)
NEUT%: 61.8 % (ref 38.4–76.8)
Platelets: 139 10*3/uL — ABNORMAL LOW (ref 145–400)
RBC: 4.36 10*6/uL (ref 3.70–5.45)
RDW: 15.2 % — ABNORMAL HIGH (ref 11.2–14.5)
WBC: 8 10*3/uL (ref 3.9–10.3)

## 2013-10-19 MED ORDER — DARBEPOETIN ALFA-POLYSORBATE 200 MCG/0.4ML IJ SOLN
200.0000 ug | Freq: Once | INTRAMUSCULAR | Status: DC
  Filled 2013-10-19: qty 0.4

## 2013-10-19 MED ORDER — CYANOCOBALAMIN 1000 MCG/ML IJ SOLN
1000.0000 ug | Freq: Once | INTRAMUSCULAR | Status: AC
  Administered 2013-10-19: 1000 ug via INTRAMUSCULAR

## 2013-11-03 ENCOUNTER — Other Ambulatory Visit: Payer: Self-pay | Admitting: Internal Medicine

## 2013-11-03 NOTE — Telephone Encounter (Signed)
Message sent to front office to schedule pt an appt. 

## 2013-11-03 NOTE — Telephone Encounter (Signed)
Has not been seen in 1 year, needs appointment prior to further refills.  Thanks!

## 2013-11-14 ENCOUNTER — Other Ambulatory Visit: Payer: Self-pay | Admitting: Internal Medicine

## 2013-11-14 NOTE — Telephone Encounter (Signed)
Note sent to front office to schedule

## 2013-11-14 NOTE — Telephone Encounter (Signed)
She has not been seen in over a year.  Needs an appointment for refills beyond this one.   Thx.

## 2013-11-15 ENCOUNTER — Other Ambulatory Visit: Payer: Self-pay | Admitting: Internal Medicine

## 2013-11-15 DIAGNOSIS — D631 Anemia in chronic kidney disease: Secondary | ICD-10-CM

## 2013-11-15 DIAGNOSIS — N183 Chronic kidney disease, stage 3 (moderate): Principal | ICD-10-CM

## 2013-11-16 ENCOUNTER — Other Ambulatory Visit: Payer: PRIVATE HEALTH INSURANCE

## 2013-11-16 ENCOUNTER — Ambulatory Visit: Payer: PRIVATE HEALTH INSURANCE

## 2013-12-07 ENCOUNTER — Encounter: Payer: Self-pay | Admitting: Internal Medicine

## 2013-12-07 ENCOUNTER — Ambulatory Visit (INDEPENDENT_AMBULATORY_CARE_PROVIDER_SITE_OTHER): Payer: PRIVATE HEALTH INSURANCE | Admitting: Internal Medicine

## 2013-12-07 VITALS — BP 149/85 | HR 88 | Temp 97.6°F | Wt 189.3 lb

## 2013-12-07 DIAGNOSIS — E1142 Type 2 diabetes mellitus with diabetic polyneuropathy: Secondary | ICD-10-CM

## 2013-12-07 DIAGNOSIS — I4891 Unspecified atrial fibrillation: Secondary | ICD-10-CM

## 2013-12-07 DIAGNOSIS — E1149 Type 2 diabetes mellitus with other diabetic neurological complication: Secondary | ICD-10-CM

## 2013-12-07 DIAGNOSIS — E039 Hypothyroidism, unspecified: Secondary | ICD-10-CM

## 2013-12-07 DIAGNOSIS — D518 Other vitamin B12 deficiency anemias: Secondary | ICD-10-CM

## 2013-12-07 DIAGNOSIS — I482 Chronic atrial fibrillation, unspecified: Secondary | ICD-10-CM

## 2013-12-07 DIAGNOSIS — E1140 Type 2 diabetes mellitus with diabetic neuropathy, unspecified: Secondary | ICD-10-CM

## 2013-12-07 DIAGNOSIS — E785 Hyperlipidemia, unspecified: Secondary | ICD-10-CM

## 2013-12-07 DIAGNOSIS — I10 Essential (primary) hypertension: Secondary | ICD-10-CM

## 2013-12-07 LAB — LIPID PANEL
CHOL/HDL RATIO: 2.4 ratio
Cholesterol: 199 mg/dL (ref 0–200)
HDL: 83 mg/dL (ref >39)
LDL Cholesterol: 105 mg/dL — ABNORMAL HIGH (ref 0–99)
Triglycerides: 57 mg/dL (ref <150)
VLDL: 11 mg/dL (ref 0–40)

## 2013-12-07 LAB — POCT GLYCOSYLATED HEMOGLOBIN (HGB A1C): Hemoglobin A1C: 7.2

## 2013-12-07 LAB — TSH: TSH: 0.472 u[IU]/mL (ref 0.350–4.500)

## 2013-12-07 LAB — GLUCOSE, CAPILLARY: Glucose-Capillary: 90 mg/dL (ref 70–99)

## 2013-12-07 NOTE — Assessment & Plan Note (Signed)
Follows with Hematology

## 2013-12-07 NOTE — Progress Notes (Signed)
Subjective:    Patient ID: [name], female    DOB: Jul 28, 1934, 79 y.o.   MRN: [id number]  CC: routine follow up  HPI  Ms. Creedon is a 79yo woman with PMH of DM2 with neuropathy, Hypothyroidism, HLD, HTN, CKD stage 4, atrial fibrillation, AoCD, MGUS and vitamin D deficiency who presents for routine follow up.   Follows with Nephrology, last appointment note we have is from 04/29/2013.  Due to see them every 6 months.    Follows with Hematology for Anemia of chronic disease and B12 deficiency and MGUS.  Gets aranesp and B12 injections.    Ms. Lamke has a history of atrial fibrillation.  At last visit a year ago, she was requested to start taking her metoprolol and xarelto given her risk scores (CHADS2 score of 3, HAS BLED score of at least 4). She was not able to afford xarelto through her insurance, but then did start taking it and stopped due to easy bruising.  Due to her bleeding risk, she may not be appropriate for coumadin.  We discussed high dose aspirin as an option and she will start taking this medication.  She is taking metoprolol as requested and repeat HR was 88.    Ms. Bin also has a history of Hypothyroidism.  She is on levothyroxine.  TSH will be checked today.  No specific symptoms concerning today.   She further has a history of DM2, she is on glipizide and januvia at this time.  She has a history of neuropathy and nephropathy.  We do not have record of a recent eye exam, she reports that the last time she saw her eye doctor was > 1 year ago.  She also has HLD and is on pravastatin.        Review of Systems  Constitutional: Negative for fever, chills and fatigue.  HENT: Negative for ear pain, hearing loss and trouble swallowing.   Eyes: Positive for visual disturbance (occasionally bright light bothers her, no headache). Negative for photophobia.  Respiratory: Negative for cough, choking and shortness of breath.   Cardiovascular: Negative for chest pain and  palpitations.  Gastrointestinal: Positive for constipation (occasional, better with increased fiber intake). Negative for nausea, diarrhea, blood in stool and anal bleeding.  Genitourinary: Negative for dysuria and difficulty urinating.  Musculoskeletal: Positive for gait problem (unsteady without walker). Negative for arthralgias and back pain.  Skin: Negative for rash and wound.  Neurological: Negative for dizziness, weakness, light-headedness and headaches.  Psychiatric/Behavioral: Negative for confusion and decreased concentration.       Objective:   Physical Exam  Constitutional: She is oriented to person, place, and time.  Elderly woman in NAD, alert and oriented  HENT:  Head: Normocephalic and atraumatic.  Eyes: No scleral icterus.  Neck: Neck supple.  Cardiovascular: Exam reveals no friction rub.   Mildly tachycardic, improved with rest in the room.  No murmur noted.  Irreg Irreg  Pulmonary/Chest: Effort normal and breath sounds normal. No respiratory distress. She has no wheezes.  Abdominal: Soft. Bowel sounds are normal. There is no tenderness.  Musculoskeletal: She exhibits no edema and no tenderness.  Wearing diabetic shoes  Lymphadenopathy:    She has no cervical adenopathy.  Neurological: She is alert and oriented to person, place, and time.  Skin: Skin is warm and dry.  Psychiatric: She has a normal mood and affect. Her behavior is normal.   Lipid panel and TSH today       Assessment &  Plan:  RTC in 3 months

## 2013-12-07 NOTE — Patient Instructions (Signed)
General Instructions: Please schedule a follow up visit within the next 3 months.   For your medications:   Please bring all of your pill  Bottles with you to each visit.  This will help make sure that we have an up to date list of all the medications you are taking.  Please also bring any over the counter herbal medications you are taking (not including advil, tylenol, etc.)  Please continue taking all of your medications as prescribed.   Please START taking a full dose aspirin daily for protection from stroke.  Aspirin 325mg  daily.  You can get this at your pharmacy.   Please schedule an appointment with your eye doctor within the next month to check you for diabetes in your eyes.    I will contact you with your lab work.   Thank you!   Treatment Goals:  Goals (1 Years of Data) as of 12/07/13   None      Progress Toward Treatment Goals:  Treatment Goal 12/07/2013  Hemoglobin A1C at goal  Blood pressure at goal    Self Care Goals & Plans:  Self Care Goal 06/28/2012  Manage my medications take my medicines as prescribed; bring my medications to every visit  Monitor my health bring my glucose meter and log to each visit    Home Blood Glucose Monitoring 10/25/2012  Check my blood sugar (No Data)  When to check my blood sugar before breakfast     Care Management & Community Referrals:  Referral 10/25/2012  Referrals made for care management support none needed

## 2013-12-07 NOTE — Assessment & Plan Note (Signed)
Lab Results  Component Value Date   HGBA1C 7.2 12/07/2013   HGBA1C 6.8 10/25/2012   HGBA1C 6.7 06/28/2012     Assessment: Diabetes control: fair control Progress toward A1C goal:  at goal Comments: Likely well controlled, though with a slight worsening.  Plan: Medications:  continue current medications, januvia, sulfonylurea (not on metformin due to renal function) Home glucose monitoring: Frequency:   Timing:   Instruction/counseling given: reminded to get eye exam, reminded to bring medications to each visit and discussed foot care Educational resources provided:   Self management tools provided:   Other plans:   Eye exam is due, I encouraged her strongly to make appointment with Eye doctor.  She has nephropathy and neuropathy, she likely has a component of retinopathy.  She sees a kidney specialist, Last BUN/Cr 57/2.6 which is slightly worse than prior.  She is due for LDL check and blood was drawn today.  Her BP is in range of goal.

## 2013-12-07 NOTE — Assessment & Plan Note (Signed)
No reported symptoms today except some occasional constipation.  Check TSH, continue synthroid.

## 2013-12-07 NOTE — Assessment & Plan Note (Addendum)
BP Readings from Last 3 Encounters:  12/07/13 149/85  10/19/13 149/88  09/21/13 152/79    Lab Results  Component Value Date   NA 142 09/21/2013   K 4.3 09/21/2013   CREATININE 2.6* 09/21/2013    Assessment: Blood pressure control: mildly elevated Progress toward BP goal:  at goal Comments: likely well controlled for her, she is averse to medication changes today  Plan: Medications:  continue current medications Educational resources provided:   Self management tools provided:   Other plans: Renal function is elevated, follows with Nephrology

## 2013-12-07 NOTE — Assessment & Plan Note (Signed)
Taking metoprolol and doing well with this medication, repeat HR was 88.  She is not on any anticoagulation and is not interested in starting at this time.  We discussed taking a full dose aspirin as an option and she will attempt to take this medication.  She is at higher risk for bleeding.

## 2013-12-07 NOTE — Assessment & Plan Note (Signed)
She reports taking her pravastatin, will check lipid panel today

## 2013-12-13 ENCOUNTER — Encounter: Payer: Self-pay | Admitting: Internal Medicine

## 2013-12-13 NOTE — Progress Notes (Signed)
Patient ID: [name], female   DOB: 1935-03-21, 79 y.o.   MRN: [id number]  LDL is close to goal of < 100, continue pravastatin. TSH at goal, continue synthroid.

## 2013-12-14 ENCOUNTER — Ambulatory Visit (HOSPITAL_BASED_OUTPATIENT_CLINIC_OR_DEPARTMENT_OTHER): Payer: PRIVATE HEALTH INSURANCE

## 2013-12-14 ENCOUNTER — Other Ambulatory Visit (HOSPITAL_BASED_OUTPATIENT_CLINIC_OR_DEPARTMENT_OTHER): Payer: PRIVATE HEALTH INSURANCE

## 2013-12-14 VITALS — BP 129/78 | HR 104 | Temp 97.7°F

## 2013-12-14 DIAGNOSIS — D518 Other vitamin B12 deficiency anemias: Secondary | ICD-10-CM

## 2013-12-14 DIAGNOSIS — D631 Anemia in chronic kidney disease: Secondary | ICD-10-CM

## 2013-12-14 DIAGNOSIS — N039 Chronic nephritic syndrome with unspecified morphologic changes: Secondary | ICD-10-CM

## 2013-12-14 DIAGNOSIS — N183 Chronic kidney disease, stage 3 (moderate): Principal | ICD-10-CM

## 2013-12-14 DIAGNOSIS — E538 Deficiency of other specified B group vitamins: Secondary | ICD-10-CM

## 2013-12-14 DIAGNOSIS — N184 Chronic kidney disease, stage 4 (severe): Secondary | ICD-10-CM

## 2013-12-14 LAB — CBC WITH DIFFERENTIAL/PLATELET
BASO%: 0.8 % (ref 0.0–2.0)
Basophils Absolute: 0.1 10e3/uL (ref 0.0–0.1)
EOS%: 6.5 % (ref 0.0–7.0)
Eosinophils Absolute: 0.5 10e3/uL (ref 0.0–0.5)
HCT: 33.5 % — ABNORMAL LOW (ref 34.8–46.6)
HGB: 10.5 g/dL — ABNORMAL LOW (ref 11.6–15.9)
LYMPH%: 19.8 % (ref 14.0–49.7)
MCH: 27.9 pg (ref 25.1–34.0)
MCHC: 31.4 g/dL — ABNORMAL LOW (ref 31.5–36.0)
MCV: 88.8 fL (ref 79.5–101.0)
MONO#: 0.8 10e3/uL (ref 0.1–0.9)
MONO%: 10.1 % (ref 0.0–14.0)
NEUT#: 4.9 10e3/uL (ref 1.5–6.5)
NEUT%: 62.8 % (ref 38.4–76.8)
Platelets: 124 10e3/uL — ABNORMAL LOW (ref 145–400)
RBC: 3.77 10e6/uL (ref 3.70–5.45)
RDW: 15.2 % — ABNORMAL HIGH (ref 11.2–14.5)
WBC: 7.8 10e3/uL (ref 3.9–10.3)
lymph#: 1.5 10e3/uL (ref 0.9–3.3)

## 2013-12-14 MED ORDER — CYANOCOBALAMIN 1000 MCG/ML IJ SOLN
1000.0000 ug | Freq: Once | INTRAMUSCULAR | Status: AC
  Administered 2013-12-14: 1000 ug via INTRAMUSCULAR

## 2013-12-14 MED ORDER — DARBEPOETIN ALFA-POLYSORBATE 200 MCG/0.4ML IJ SOLN
200.0000 ug | Freq: Once | INTRAMUSCULAR | Status: AC
  Administered 2013-12-14: 200 ug via SUBCUTANEOUS
  Filled 2013-12-14: qty 0.4

## 2013-12-20 ENCOUNTER — Telehealth: Payer: Self-pay | Admitting: Dietician

## 2013-12-20 ENCOUNTER — Encounter: Payer: Self-pay | Admitting: Dietician

## 2013-12-20 NOTE — Telephone Encounter (Signed)
Patient did not want assistance in scheduling eye exam because she needs to coordinate transportation. She has seen Dr. Monna Fam and [facility] on [address] for diabetes eye exams since 2009 but none in the past year. She agreed to have [place] mail a reminder card to call Dr. Payton Emerald office for anappointment

## 2013-12-28 ENCOUNTER — Other Ambulatory Visit: Payer: Self-pay | Admitting: *Deleted

## 2013-12-28 MED ORDER — GLIPIZIDE 10 MG PO TABS: 10.0000 mg | ORAL_TABLET | Freq: Two times a day (BID) | ORAL | Status: DC

## 2013-12-28 MED ORDER — LISINOPRIL 40 MG PO TABS: ORAL_TABLET | ORAL | Status: DC

## 2014-01-10 ENCOUNTER — Other Ambulatory Visit: Payer: Self-pay | Admitting: Internal Medicine

## 2014-01-11 ENCOUNTER — Ambulatory Visit: Payer: PRIVATE HEALTH INSURANCE

## 2014-01-11 ENCOUNTER — Other Ambulatory Visit (HOSPITAL_BASED_OUTPATIENT_CLINIC_OR_DEPARTMENT_OTHER): Payer: PRIVATE HEALTH INSURANCE

## 2014-01-11 DIAGNOSIS — D472 Monoclonal gammopathy: Secondary | ICD-10-CM

## 2014-01-11 DIAGNOSIS — D518 Other vitamin B12 deficiency anemias: Secondary | ICD-10-CM

## 2014-01-11 DIAGNOSIS — N189 Chronic kidney disease, unspecified: Secondary | ICD-10-CM

## 2014-01-11 DIAGNOSIS — D631 Anemia in chronic kidney disease: Secondary | ICD-10-CM

## 2014-01-11 DIAGNOSIS — N184 Chronic kidney disease, stage 4 (severe): Secondary | ICD-10-CM

## 2014-01-11 DIAGNOSIS — N183 Chronic kidney disease, stage 3 (moderate): Secondary | ICD-10-CM

## 2014-01-11 DIAGNOSIS — N039 Chronic nephritic syndrome with unspecified morphologic changes: Secondary | ICD-10-CM

## 2014-01-11 LAB — CBC WITH DIFFERENTIAL/PLATELET
BASO%: 0.9 % (ref 0.0–2.0)
BASOS ABS: 0.1 10*3/uL (ref 0.0–0.1)
EOS%: 8.8 % — ABNORMAL HIGH (ref 0.0–7.0)
Eosinophils Absolute: 0.6 10*3/uL — ABNORMAL HIGH (ref 0.0–0.5)
HCT: 38.9 % (ref 34.8–46.6)
HEMOGLOBIN: 12.1 g/dL (ref 11.6–15.9)
LYMPH%: 21.2 % (ref 14.0–49.7)
MCH: 28.3 pg (ref 25.1–34.0)
MCHC: 31.2 g/dL — ABNORMAL LOW (ref 31.5–36.0)
MCV: 90.7 fL (ref 79.5–101.0)
MONO#: 0.8 10*3/uL (ref 0.1–0.9)
MONO%: 10.8 % (ref 0.0–14.0)
NEUT#: 4.3 10*3/uL (ref 1.5–6.5)
NEUT%: 58.3 % (ref 38.4–76.8)
PLATELETS: 116 10*3/uL — AB (ref 145–400)
RBC: 4.29 10*6/uL (ref 3.70–5.45)
RDW: 15.4 % — ABNORMAL HIGH (ref 11.2–14.5)
WBC: 7.4 10*3/uL (ref 3.9–10.3)
lymph#: 1.6 10*3/uL (ref 0.9–3.3)

## 2014-01-11 MED ORDER — DARBEPOETIN ALFA-POLYSORBATE 500 MCG/ML IJ SOLN: 200.0000 ug | Freq: Once | INTRAMUSCULAR | Status: DC

## 2014-02-02 ENCOUNTER — Other Ambulatory Visit: Payer: Self-pay | Admitting: *Deleted

## 2014-02-02 MED ORDER — PANTOPRAZOLE SODIUM 40 MG PO TBEC: 40.0000 mg | DELAYED_RELEASE_TABLET | Freq: Every day | ORAL | Status: DC

## 2014-02-07 ENCOUNTER — Other Ambulatory Visit: Payer: Self-pay | Admitting: *Deleted

## 2014-02-07 DIAGNOSIS — D518 Other vitamin B12 deficiency anemias: Secondary | ICD-10-CM

## 2014-02-07 DIAGNOSIS — D472 Monoclonal gammopathy: Secondary | ICD-10-CM

## 2014-02-08 ENCOUNTER — Ambulatory Visit: Payer: PRIVATE HEALTH INSURANCE

## 2014-02-08 ENCOUNTER — Other Ambulatory Visit: Payer: PRIVATE HEALTH INSURANCE

## 2014-02-20 ENCOUNTER — Other Ambulatory Visit: Payer: Self-pay | Admitting: Internal Medicine

## 2014-03-08 ENCOUNTER — Ambulatory Visit: Payer: PRIVATE HEALTH INSURANCE

## 2014-03-08 ENCOUNTER — Other Ambulatory Visit: Payer: PRIVATE HEALTH INSURANCE

## 2014-03-08 ENCOUNTER — Ambulatory Visit (HOSPITAL_BASED_OUTPATIENT_CLINIC_OR_DEPARTMENT_OTHER): Payer: PRIVATE HEALTH INSURANCE

## 2014-03-08 ENCOUNTER — Other Ambulatory Visit (HOSPITAL_BASED_OUTPATIENT_CLINIC_OR_DEPARTMENT_OTHER): Payer: PRIVATE HEALTH INSURANCE

## 2014-03-08 VITALS — BP 139/73 | HR 91 | Temp 98.2°F

## 2014-03-08 DIAGNOSIS — D472 Monoclonal gammopathy: Secondary | ICD-10-CM

## 2014-03-08 DIAGNOSIS — D518 Other vitamin B12 deficiency anemias: Secondary | ICD-10-CM

## 2014-03-08 DIAGNOSIS — N183 Chronic kidney disease, stage 3 (moderate): Secondary | ICD-10-CM

## 2014-03-08 DIAGNOSIS — N184 Chronic kidney disease, stage 4 (severe): Secondary | ICD-10-CM

## 2014-03-08 DIAGNOSIS — E538 Deficiency of other specified B group vitamins: Secondary | ICD-10-CM

## 2014-03-08 DIAGNOSIS — D631 Anemia in chronic kidney disease: Secondary | ICD-10-CM

## 2014-03-08 LAB — CBC WITH DIFFERENTIAL/PLATELET
BASO%: 0.8 % (ref 0.0–2.0)
BASOS ABS: 0.1 10*3/uL (ref 0.0–0.1)
EOS ABS: 0.5 10*3/uL (ref 0.0–0.5)
EOS%: 6.8 % (ref 0.0–7.0)
HCT: 32.3 % — ABNORMAL LOW (ref 34.8–46.6)
HEMOGLOBIN: 10.1 g/dL — AB (ref 11.6–15.9)
LYMPH%: 21.7 % (ref 14.0–49.7)
MCH: 28.5 pg (ref 25.1–34.0)
MCHC: 31.3 g/dL — ABNORMAL LOW (ref 31.5–36.0)
MCV: 91 fL (ref 79.5–101.0)
MONO#: 0.8 10*3/uL (ref 0.1–0.9)
MONO%: 11.3 % (ref 0.0–14.0)
NEUT%: 59.4 % (ref 38.4–76.8)
NEUTROS ABS: 4.3 10*3/uL (ref 1.5–6.5)
Platelets: 126 10*3/uL — ABNORMAL LOW (ref 145–400)
RBC: 3.54 10*6/uL — AB (ref 3.70–5.45)
RDW: 14.5 % (ref 11.2–14.5)
WBC: 7.2 10*3/uL (ref 3.9–10.3)
lymph#: 1.6 10*3/uL (ref 0.9–3.3)

## 2014-03-08 MED ORDER — DARBEPOETIN ALFA-POLYSORBATE 200 MCG/0.4ML IJ SOLN
200.0000 ug | Freq: Once | INTRAMUSCULAR | Status: AC
  Administered 2014-03-08: 200 ug via SUBCUTANEOUS
  Filled 2014-03-08: qty 0.4

## 2014-03-08 MED ORDER — CYANOCOBALAMIN 1000 MCG/ML IJ SOLN
1000.0000 ug | Freq: Once | INTRAMUSCULAR | Status: AC
  Administered 2014-03-08: 1000 ug via INTRAMUSCULAR

## 2014-03-08 MED ORDER — CYANOCOBALAMIN 1000 MCG/ML IJ SOLN: 1000.0000 ug | Freq: Once | INTRAMUSCULAR | Status: DC

## 2014-03-22 ENCOUNTER — Ambulatory Visit: Payer: PRIVATE HEALTH INSURANCE | Admitting: Internal Medicine

## 2014-03-27 ENCOUNTER — Other Ambulatory Visit: Payer: Self-pay | Admitting: Internal Medicine

## 2014-03-28 ENCOUNTER — Other Ambulatory Visit: Payer: Self-pay | Admitting: *Deleted

## 2014-03-28 MED ORDER — LEVOTHYROXINE SODIUM 112 MCG PO TABS: 112.0000 ug | ORAL_TABLET | Freq: Every day | ORAL | Status: DC

## 2014-04-04 ENCOUNTER — Telehealth: Payer: Self-pay | Admitting: Hematology

## 2014-04-04 ENCOUNTER — Other Ambulatory Visit: Payer: Self-pay | Admitting: *Deleted

## 2014-04-04 DIAGNOSIS — D631 Anemia in chronic kidney disease: Secondary | ICD-10-CM

## 2014-04-04 DIAGNOSIS — D472 Monoclonal gammopathy: Secondary | ICD-10-CM

## 2014-04-04 DIAGNOSIS — N189 Chronic kidney disease, unspecified: Secondary | ICD-10-CM

## 2014-04-04 NOTE — Telephone Encounter (Signed)
Confirm appt d.t for 11.18.15.

## 2014-04-05 ENCOUNTER — Ambulatory Visit: Payer: PRIVATE HEALTH INSURANCE

## 2014-04-05 ENCOUNTER — Encounter: Payer: PRIVATE HEALTH INSURANCE | Admitting: Hematology

## 2014-04-05 ENCOUNTER — Telehealth: Payer: Self-pay | Admitting: Hematology

## 2014-04-05 ENCOUNTER — Other Ambulatory Visit: Payer: PRIVATE HEALTH INSURANCE

## 2014-04-05 NOTE — Progress Notes (Signed)
This encounter was created in error - please disregard.

## 2014-04-05 NOTE — Telephone Encounter (Signed)
Pt called to r/s appt for 04/05/14 to 04/20/14. Pt is aware of appt.

## 2014-04-19 ENCOUNTER — Ambulatory Visit: Payer: PRIVATE HEALTH INSURANCE | Admitting: Internal Medicine

## 2014-04-20 ENCOUNTER — Other Ambulatory Visit (HOSPITAL_BASED_OUTPATIENT_CLINIC_OR_DEPARTMENT_OTHER): Payer: PRIVATE HEALTH INSURANCE

## 2014-04-20 ENCOUNTER — Ambulatory Visit: Payer: PRIVATE HEALTH INSURANCE

## 2014-04-20 ENCOUNTER — Ambulatory Visit (HOSPITAL_BASED_OUTPATIENT_CLINIC_OR_DEPARTMENT_OTHER): Payer: PRIVATE HEALTH INSURANCE | Admitting: Hematology

## 2014-04-20 ENCOUNTER — Encounter: Payer: Self-pay | Admitting: Hematology

## 2014-04-20 VITALS — BP 133/84 | HR 118 | Temp 98.0°F | Resp 20 | Ht 66.0 in | Wt 194.6 lb

## 2014-04-20 DIAGNOSIS — D649 Anemia, unspecified: Secondary | ICD-10-CM

## 2014-04-20 DIAGNOSIS — D472 Monoclonal gammopathy: Secondary | ICD-10-CM

## 2014-04-20 DIAGNOSIS — D631 Anemia in chronic kidney disease: Secondary | ICD-10-CM

## 2014-04-20 DIAGNOSIS — N189 Chronic kidney disease, unspecified: Secondary | ICD-10-CM

## 2014-04-20 LAB — COMPREHENSIVE METABOLIC PANEL (CC13)
ALT: 10 U/L (ref 0–55)
AST: 12 U/L (ref 5–34)
Albumin: 3.6 g/dL (ref 3.5–5.0)
Alkaline Phosphatase: 84 U/L (ref 40–150)
Anion Gap: 12 mEq/L — ABNORMAL HIGH (ref 3–11)
BILIRUBIN TOTAL: 0.41 mg/dL (ref 0.20–1.20)
BUN: 49 mg/dL — AB (ref 7.0–26.0)
CALCIUM: 8.7 mg/dL (ref 8.4–10.4)
CO2: 21 meq/L — AB (ref 22–29)
Chloride: 110 mEq/L — ABNORMAL HIGH (ref 98–109)
Creatinine: 2.5 mg/dL — ABNORMAL HIGH (ref 0.6–1.1)
EGFR: 21 mL/min/{1.73_m2} — ABNORMAL LOW (ref >90)
Glucose: 83 mg/dl (ref 70–140)
Potassium: 4.9 mEq/L (ref 3.5–5.1)
Sodium: 143 mEq/L (ref 136–145)
TOTAL PROTEIN: 7 g/dL (ref 6.4–8.3)

## 2014-04-20 LAB — CBC WITH DIFFERENTIAL/PLATELET
BASO%: 0.7 % (ref 0.0–2.0)
Basophils Absolute: 0 10*3/uL (ref 0.0–0.1)
EOS%: 9.1 % — ABNORMAL HIGH (ref 0.0–7.0)
Eosinophils Absolute: 0.6 10*3/uL — ABNORMAL HIGH (ref 0.0–0.5)
HCT: 29.8 % — ABNORMAL LOW (ref 34.8–46.6)
HGB: 9.2 g/dL — ABNORMAL LOW (ref 11.6–15.9)
LYMPH%: 18.3 % (ref 14.0–49.7)
MCH: 27.9 pg (ref 25.1–34.0)
MCHC: 30.8 g/dL — AB (ref 31.5–36.0)
MCV: 90.5 fL (ref 79.5–101.0)
MONO#: 0.7 10*3/uL (ref 0.1–0.9)
MONO%: 11.2 % (ref 0.0–14.0)
NEUT#: 4 10*3/uL (ref 1.5–6.5)
NEUT%: 60.7 % (ref 38.4–76.8)
PLATELETS: 140 10*3/uL — AB (ref 145–400)
RBC: 3.29 10*6/uL — ABNORMAL LOW (ref 3.70–5.45)
RDW: 14.5 % (ref 11.2–14.5)
WBC: 6.5 10*3/uL (ref 3.9–10.3)
lymph#: 1.2 10*3/uL (ref 0.9–3.3)

## 2014-04-20 LAB — LACTATE DEHYDROGENASE (CC13): LDH: 181 U/L (ref 125–245)

## 2014-04-20 MED ORDER — DARBEPOETIN ALFA 300 MCG/0.6ML IJ SOSY
300.0000 ug | PREFILLED_SYRINGE | Freq: Once | INTRAMUSCULAR | Status: AC
  Administered 2014-04-20: 300 ug via SUBCUTANEOUS
  Filled 2014-04-20: qty 0.6

## 2014-04-20 NOTE — Progress Notes (Signed)
[facility] OFFICE PROGRESS NOTE  [name], MD [place] [address]  DIAGNOSIS: Anemia, unspecified anemia type   Chief Complaint  Patient presents with  . Follow-up    low red count   CURRENT THERAPY: Vitamin B12 shots every 8 weeks and aranesp 200 mcg every 4 weeks for a hemoglobin less than or equal to 11, started on 01/09/2013. Her last injections were on 03/08/2014  PROBLEM LIST:  1. Chronic anemia.  2. Renal insufficiency.  3. Atrial fibrillation.  4. 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  5. Low vitamin B12 level.  6. Osteomyelitis of the left foot status post left mid foot amputation  on 05/30/2010.  7. Status post amputation of toes of right foot in December 2012.  8. Hypertension.  9. Diabetes mellitus.  10.Venous insufficiency.  11.Dyslipidemia.  12.Status post total thyroidectomy for multinodular goiter on  03/13/2006.  13.History of asthma.  14.Thrombocytopenia with variable platelet count first noted 08/2010,  suspect chronic idiopathic thrombocytopenic purpura.   INTERVAL HISTORY: [name] 79 y.o. female with a history of chronic kidney disease complicated by anemia and [place] B12 deficiency is here for follow-up.  She was last seen by Dr. Juliann Mule 6 months ago. She has been getting the above B12 and iron injection every 1-2 months.   She comes in today by herself. She was quite drowsy and appears fatigued when I saw her. She states her fatigue has been slightly worse daily, but otherwise she feels well in general.  She lives alone in a senior citzens complex.  She handles her basic and intermediate ADLs without difficulty.  She denies any significant pain, dyspnea, GI symptoms. Her appetite is fair, no recent weight loss.  MEDICAL HISTORY: Past Medical History  Diagnosis Date  . Hypertension   . Hyperlipidemia   . Chronic venous insufficiency   . Osteomyelitis     s/p Rt 2nd  toe and left 5 toes  amputation in 1/12  by Dr. Sharol Given  . Diabetes mellitus   . Anemia     b12 def, iron def, follow at cancer center, gets B12 and  another injection there. Could not remeber the name. Dr. Ralene Ok is her  cancer doctor  . Thyroid disease     hypothyroidism h/o hyperthyroidism s/p ablation/ectomy  . Asthma   . Dysrhythmia     atrial fibb  . Shortness of breath   . Blood transfusion     two or more yrs ago  . Hypothyroidism     thyroid removed 4 or more yrs ago  . Atrial fibrillation   . Chronic renal insufficiency      ALLERGIES:  has No Known Allergies.  MEDICATIONS: has a current medication list which includes the following prescription(s): aspirin, glipizide, januvia, lancets 30g, levothyroxine, lisinopril, metoprolol, calcitriol, pantoprazole, and pravastatin, and the following Facility-Administered Medications: darbepoetin alfa.  SURGICAL HISTORY:  Past Surgical History  Procedure Laterality Date  . Eye surgery      cat ext ou  . Appendectomy      teenager  . Amputation  04/30/2011    Procedure: AMPUTATION FOOT;  Surgeon: Newt Minion, MD;  Location: [facility];  Service: Orthopedics;  Laterality: Right;  Right Midfoot Amputation    REVIEW OF SYSTEMS:   Constitutional: Denies fevers, chills or abnormal weight loss Eyes: Denies blurriness of vision Ears, nose, mouth, throat, and face: Denies mucositis or sore throat Respiratory: Denies cough,  dyspnea or wheezes Cardiovascular: Denies palpitation, chest discomfort or lower extremity swelling Gastrointestinal:  Denies nausea, heartburn or change in bowel habits Skin: Denies abnormal skin rashes Lymphatics: Denies new lymphadenopathy or easy bruising Neurological:Denies numbness, tingling or new weaknesses Behavioral/Psych: Mood is stable, no new changes  All other systems were reviewed with the patient and are negative.  PHYSICAL EXAMINATION: ECOG PERFORMANCE STATUS: 1 - Symptomatic but completely  ambulatory  Blood pressure 133/84, pulse 118, temperature 98 F (36.7 C), temperature source Oral, resp. rate 20, height 5\' 6"  (1.676 m), weight 194 lb 9.6 oz (88.27 kg).  GENERAL:alert, no distress and comfortable; chronically ill elderly female who uses a walker.  SKIN: skin color, texture, turgor are normal, no rashes or significant lesions EYES: normal, Conjunctiva are pink and non-injected, sclera clear OROPHARYNX:no exudate, no erythema and lips, buccal mucosa, and tongue normal  NECK: supple, thyroid normal size, non-tender, without nodularity LYMPH:  no palpable lymphadenopathy in the cervical, axillary or supraclavicular LUNGS: clear to auscultation and percussion with normal breathing effort HEART: irregular rate & rhythm and no murmurs and 1+ lower extremity edema ABDOMEN:abdomen soft, non-tender and normal bowel sounds Musculoskeletal:no cyanosis of digits and no clubbing; s/p bilateral toes amputations as noted above.  NEURO: alert & oriented x 3 with fluent speech, ambulates via a walker  LABORATORY DATA: Results for orders placed or performed in visit on 04/20/14 (from the past 48 hour(s))  CBC with Differential     Status: Abnormal   Collection Time: 04/20/14 12:24 PM  Result Value Ref Range   WBC 6.5 3.9 - 10.3 10e3/uL   NEUT# 4.0 1.5 - 6.5 10e3/uL   HGB 9.2 (L) 11.6 - 15.9 g/dL   HCT 14/03/15 (L) 81.5 - 94.7 %   Platelets 140 (L) 145 - 400 10e3/uL   MCV 90.5 79.5 - 101.0 fL   MCH 27.9 25.1 - 34.0 pg   MCHC 30.8 (L) 31.5 - 36.0 g/dL   RBC 07.6 (L) 1.51 - 8.34 10e6/uL   RDW 14.5 11.2 - 14.5 %   lymph# 1.2 0.9 - 3.3 10e3/uL   MONO# 0.7 0.1 - 0.9 10e3/uL   Eosinophils Absolute 0.6 (H) 0.0 - 0.5 10e3/uL   Basophils Absolute 0.0 0.0 - 0.1 10e3/uL   NEUT% 60.7 38.4 - 76.8 %   LYMPH% 18.3 14.0 - 49.7 %   MONO% 11.2 0.0 - 14.0 %   EOS% 9.1 (H) 0.0 - 7.0 %   BASO% 0.7 0.0 - 2.0 %  Lactate dehydrogenase     Status: None   Collection Time: 04/20/14 12:24 PM  Result Value  Ref Range   LDH 181 125 - 245 U/L  Comprehensive metabolic panel (Cmet) - [facility]     Status: Abnormal   Collection Time: 04/20/14 12:25 PM  Result Value Ref Range   Sodium 143 136 - 145 mEq/L   Potassium 4.9 3.5 - 5.1 mEq/L   Chloride 110 (H) 98 - 109 mEq/L   CO2 21 (L) 22 - 29 mEq/L   Glucose 83 70 - 140 mg/dl   BUN 14/03/15 (H) 7.0 - 57.8 mg/dL   Creatinine 2.5 (H) 0.6 - 1.1 mg/dL   Total Bilirubin 97.8 0.20 - 1.20 mg/dL   Alkaline Phosphatase 84 40 - 150 U/L   AST 12 5 - 34 U/L   ALT 10 0 - 55 U/L   Total Protein 7.0 6.4 - 8.3 g/dL   Albumin 3.6 3.5 - 5.0 g/dL   Calcium 8.7 8.4 - 4.78  mg/dL   Anion Gap 12 (H) 3 - 11 mEq/L   EGFR 21 (L) >90 ml/min/1.73 m2    Comment: eGFR is calculated using the CKD-EPI Creatinine Equation (2009)       Labs:  Lab Results  Component Value Date   WBC 6.5 04/20/2014   HGB 9.2* 04/20/2014   HCT 29.8* 04/20/2014   MCV 90.5 04/20/2014   PLT 140* 04/20/2014   NEUTROABS 4.0 04/20/2014      Chemistry      Component Value Date/Time   NA 143 04/20/2014 1225   NA 139 10/25/2012 1158   K 4.9 04/20/2014 1225   K 5.2 10/25/2012 1158   CL 109 10/25/2012 1158   CL 111* 04/06/2012 1031   CO2 21* 04/20/2014 1225   CO2 20 10/25/2012 1158   BUN 49.0* 04/20/2014 1225   BUN 47* 10/25/2012 1158   CREATININE 2.5* 04/20/2014 1225   CREATININE 2.24* 10/25/2012 1158   CREATININE 2.70* 11/27/2011 0908      Component Value Date/Time   CALCIUM 8.7 04/20/2014 1225   CALCIUM 9.3 10/25/2012 1158   CALCIUM TEST NOT PERFORMED 10/25/2012 1158   ALKPHOS 84 04/20/2014 1225   ALKPHOS 90 10/25/2012 1158   AST 12 04/20/2014 1225   AST 14 10/25/2012 1158   ALT 10 04/20/2014 1225   ALT 9 10/25/2012 1158   BILITOT 0.41 04/20/2014 1225   BILITOT 0.5 10/25/2012 1158      RADIOGRAPHIC STUDIES: 1. Digital screening mammogram on 07/26/2010 was negative.  2. MRI of the right forefoot without IV contrast on 08/20/2010 showed  apparent new soft tissue ulceration  distal to the third metatarsal status post prior resection of the third toe. No soft tissue abscess was demonstrated. There was interval resolution of the previously demonstrated marrow edema within the third metatarsal. No evidence for osteomyelitis. There was stable mid foot degenerative changes and chronic myopathic changes.  3. CT of the cervical spine without IV contrast on 10/21/2010 showed  degenerative changes in the cervical spine. No displaced fractures  were identified.  4. CT of the head without IV contrast on 10/21/2010 showed no evidence  for acute intracranial hemorrhage, mass lesion or acute infarct. There was some mild diffuse atrophy. There was a subcutaneous hematoma over the left anterior frontal/periorbital region. No underlying skull fracture or acute intracranial abnormality.  5. Chest x-ray, 2 view, from 04/28/2011 showed stable moderate  cardiomegaly with no active lung disease. There was probable mild  scarring at the lung bases.   ASSESSMENT: [name] 79 y.o. female with a history of multiple comorbidities, including anemia, IgA MGUS, renal failure, B12 deficiency, etc.  1. Anemia and MGUS, rule out MM  --her anemia has been getting worse, despite Arenasp injection every 1-2 months. -Given her history of IgA MGUS, renal failure, and worsening anemia, multiple myeloma needs to be ruled out. Her previous M protein level was low, and today's result is still pending. - I recommend her to have a skeletal survey and a bone marrow biopsy, which was never done before. She agrees. We'll schedule for bone marrow biopsy by IR as outpatient. -We'll check another Epo level and B12 level. Aranesp 300 unit injection today. She has been on B12 monthly injection for over one year now. I'll hold her B12 injection today, if her B12 level is high, I'll change her to by mouth B12.   2. Hypertension, diabetes, A. Fib, CKD, and other medical problems -She will continue follow-up with her  primary care physician. She is not on Anticoagulation for A. Fib  I'll see her back in 1 month's was repeated CBC and CMP.  All questions were answered. The patient knows to call the clinic with any problems, questions or concerns. We can certainly see the patient much sooner if necessary.  I spent 25 minutes counseling the patient face to face. The total time spent in the appointment was 40 minutes.    Truitt Merle, MD 04/20/2014 1:53 PM

## 2014-04-21 LAB — KAPPA/LAMBDA LIGHT CHAINS
Kappa free light chain: 74.6 mg/dL — ABNORMAL HIGH (ref 0.33–1.94)
Kappa:Lambda Ratio: 15.64 — ABNORMAL HIGH (ref 0.26–1.65)
Lambda Free Lght Chn: 4.77 mg/dL — ABNORMAL HIGH (ref 0.57–2.63)

## 2014-04-21 LAB — IGM: IgM, Serum: 47 mg/dL — ABNORMAL LOW (ref 52–322)

## 2014-04-21 LAB — IGA: IGA: 544 mg/dL — AB (ref 69–380)

## 2014-04-21 LAB — IGG: IgG (Immunoglobin G), Serum: 1010 mg/dL (ref 690–1700)

## 2014-04-23 ENCOUNTER — Other Ambulatory Visit: Payer: Self-pay | Admitting: Hematology

## 2014-04-23 DIAGNOSIS — D472 Monoclonal gammopathy: Secondary | ICD-10-CM

## 2014-04-24 ENCOUNTER — Other Ambulatory Visit: Payer: Self-pay | Admitting: Hematology

## 2014-04-24 ENCOUNTER — Telehealth: Payer: Self-pay | Admitting: Hematology

## 2014-04-24 NOTE — Telephone Encounter (Signed)
Lft msg for pt confirming labs/ov and [facility] will call pt concerning CT Biopsy for scheduling, mailed out schedule..... KJ

## 2014-04-24 NOTE — Telephone Encounter (Signed)
Pt called back states she can't do the Bone Survey on the 14th of Dec, I gave her the # to radiology to r/s this apt and advised pt I mailed sch out to her.... KJ

## 2014-04-26 ENCOUNTER — Encounter: Payer: Self-pay | Admitting: Internal Medicine

## 2014-04-26 DIAGNOSIS — M47819 Spondylosis without myelopathy or radiculopathy, site unspecified: Secondary | ICD-10-CM | POA: Insufficient documentation

## 2014-05-01 ENCOUNTER — Ambulatory Visit (HOSPITAL_COMMUNITY): Payer: PRIVATE HEALTH INSURANCE

## 2014-05-03 ENCOUNTER — Ambulatory Visit (INDEPENDENT_AMBULATORY_CARE_PROVIDER_SITE_OTHER): Payer: PRIVATE HEALTH INSURANCE | Admitting: Internal Medicine

## 2014-05-03 ENCOUNTER — Encounter: Payer: Self-pay | Admitting: Internal Medicine

## 2014-05-03 ENCOUNTER — Ambulatory Visit (HOSPITAL_COMMUNITY)
Admission: RE | Admit: 2014-05-03 | Discharge: 2014-05-03 | Disposition: A | Discharge status: Home / Self Care | Payer: PRIVATE HEALTH INSURANCE | Source: Ambulatory Visit | Attending: Hematology | Admitting: Hematology

## 2014-05-03 VITALS — BP 136/80 | HR 103 | Temp 98.6°F | Ht 66.0 in | Wt 195.4 lb

## 2014-05-03 DIAGNOSIS — I10 Essential (primary) hypertension: Secondary | ICD-10-CM

## 2014-05-03 DIAGNOSIS — D472 Monoclonal gammopathy: Secondary | ICD-10-CM

## 2014-05-03 DIAGNOSIS — Z0000 Encounter for general adult medical examination without abnormal findings: Secondary | ICD-10-CM | POA: Insufficient documentation

## 2014-05-03 DIAGNOSIS — E1140 Type 2 diabetes mellitus with diabetic neuropathy, unspecified: Secondary | ICD-10-CM

## 2014-05-03 DIAGNOSIS — M5032 Other cervical disc degeneration, mid-cervical region: Secondary | ICD-10-CM | POA: Insufficient documentation

## 2014-05-03 DIAGNOSIS — M8588 Other specified disorders of bone density and structure, other site: Secondary | ICD-10-CM | POA: Insufficient documentation

## 2014-05-03 DIAGNOSIS — I517 Cardiomegaly: Secondary | ICD-10-CM | POA: Diagnosis not present

## 2014-05-03 DIAGNOSIS — M5136 Other intervertebral disc degeneration, lumbar region: Secondary | ICD-10-CM | POA: Diagnosis not present

## 2014-05-03 DIAGNOSIS — E785 Hyperlipidemia, unspecified: Secondary | ICD-10-CM

## 2014-05-03 DIAGNOSIS — N184 Chronic kidney disease, stage 4 (severe): Secondary | ICD-10-CM

## 2014-05-03 DIAGNOSIS — D259 Leiomyoma of uterus, unspecified: Secondary | ICD-10-CM | POA: Diagnosis not present

## 2014-05-03 DIAGNOSIS — D631 Anemia in chronic kidney disease: Secondary | ICD-10-CM

## 2014-05-03 DIAGNOSIS — N189 Chronic kidney disease, unspecified: Secondary | ICD-10-CM

## 2014-05-03 DIAGNOSIS — I482 Chronic atrial fibrillation, unspecified: Secondary | ICD-10-CM

## 2014-05-03 DIAGNOSIS — E038 Other specified hypothyroidism: Secondary | ICD-10-CM

## 2014-05-03 LAB — GLUCOSE, CAPILLARY: Glucose-Capillary: 109 mg/dL — ABNORMAL HIGH (ref 70–99)

## 2014-05-03 MED ORDER — METOPROLOL TARTRATE 25 MG PO TABS: 25.0000 mg | ORAL_TABLET | Freq: Two times a day (BID) | ORAL | Status: DC

## 2014-05-03 MED ORDER — PRAVASTATIN SODIUM 20 MG PO TABS: 20.0000 mg | ORAL_TABLET | Freq: Every day | ORAL | Status: DC

## 2014-05-03 MED ORDER — GLUCOSE BLOOD VI STRP: ORAL_STRIP | Status: DC

## 2014-05-03 NOTE — Assessment & Plan Note (Signed)
Anemia is worsening.  I have reviewed her oncologist note and there is concern for conversion to MM.  She is to have a skeletal survey and it looks like a BM biopsy is planned.  Will follow up these results as well.

## 2014-05-03 NOTE — Assessment & Plan Note (Signed)
Last TSH earlier this year at goal.  Continue synthroid.  Check yearly as needed.

## 2014-05-03 NOTE — Assessment & Plan Note (Addendum)
Follows with Nephrology, I have reviewed their most recent note from November of this year.  She reports new LE edema, but on chart review, this has been a persistent problem.  I would defer to Nephrology about the need to start a medication such as lasix at this time.  I have asked her to discuss with that provider.  It is not life limiting and mild on exam.

## 2014-05-03 NOTE — Assessment & Plan Note (Signed)
See note on MGUS

## 2014-05-03 NOTE — Progress Notes (Signed)
Subjective:    Patient ID: [name], female    DOB: [birth year], 79 y.o.   MRN: [id number]  CC: Routine follow up  HPI  [name] is a 79yo woman with PMH of Atrial fibrillation, CKD stage 4 with associated anemia, DM with neuropathy, h/o leg edema, arthropathy of the spine, HLD, HTN, hypothyroidism, MGUS who presents for routine folloe up.   [name] reports that she is doing well, she has no specific complaints today. She reports that she needs refills on her medications and test strips.  For her atrial fibrillation, she is taking a full dose aspirin and metoprolol (did not take this morning).  For her DM2, she is on Januvia and glipizide.  Most recent A1C at goal.  For her CKD, she is following with nephrology and I have reviewed their last note, she was started on calcitriol recently.  For her HLD, she reports being out of this medication "for a while."    She also requests that her metoprolol be changed to 25mg  tablets, as she is struggling to split the 50mg  tablets.    She was curious as to why she was supposed to have xrays today.  I reviewed her Oncologist note and they were ordered by that provider.  I asked her to follow up and go to radiology to have them done.   She brought in most of her medications and we reviewed them.   Also taking metoprolol 25mg  BID.  Current Outpatient Prescriptions on File Prior to Visit  Medication Sig Dispense Refill  . aspirin 325 MG EC tablet Take 325 mg by mouth daily.    . calcitRIOL (ROCALTROL) 0.25 MCG capsule Take 1 capsule by mouth every Monday, Wednesday, and Friday.  5  . glipiZIDE (GLUCOTROL) 10 MG tablet Take 1 tablet (10 mg total) by mouth 2 (two) times daily before a meal. 180 tablet 3  . JANUVIA 25 MG tablet TAKE 1 TABLET BY MOUTH EVERY DAY 90 tablet 3  . Lancets 30G MISC Use to check blood sugars as directed 100 each 3  . levothyroxine (SYNTHROID, LEVOTHROID) 112 MCG tablet Take 1 tablet (112 mcg total) by mouth daily before  breakfast. 30 tablet 6  . lisinopril (PRINIVIL,ZESTRIL) 40 MG tablet TAKE ONE TABLET BY MOUTH ONE TIME DAILY 90 tablet 3  . pantoprazole (PROTONIX) 40 MG tablet Take 1 tablet (40 mg total) by mouth daily. 90 tablet 2   No current facility-administered medications on file prior to visit.    Review of Systems  Constitutional: Negative for fever, chills and fatigue.  HENT: Negative for ear discharge, ear pain and trouble swallowing.   Eyes: Negative for photophobia and visual disturbance.  Respiratory: Positive for cough (mild from weather change). Negative for chest tightness and wheezing.   Cardiovascular: Positive for leg swelling (started about 2-3 months ago). Negative for chest pain and palpitations.  Gastrointestinal: Negative for nausea, vomiting, abdominal pain and abdominal distention.  Genitourinary: Positive for enuresis (wearing pad due to sometimes not being able to make it to the bathroom in time). Negative for dysuria and difficulty urinating.  Musculoskeletal: Positive for gait problem (walking with walker). Negative for back pain and arthralgias.  Skin: Positive for pallor (peri-oral, same as previous). Negative for rash and wound.  Neurological: Negative for dizziness, syncope and light-headedness.       Objective:   Physical Exam  Constitutional: She is oriented to person, place, and time.  Patient is an elderly woman, she is somewhat  somnolent, but awakes for conversation.  She notes that this has been going on "for a long time."    HENT:  Head: Normocephalic and atraumatic.  Eyes: Conjunctivae are normal. No scleral icterus.  Cardiovascular:  Irreg Irreg and mildly tachycardic, no murmur noted, no rub or gallop  Pulmonary/Chest: Effort normal and breath sounds normal. No respiratory distress. She has no wheezes.  Abdominal: Soft. Bowel sounds are normal.  Musculoskeletal: She exhibits edema (mild 1+ to mid shin). She exhibits no tenderness.  Neurological: She is  oriented to person, place, and time. No cranial nerve deficit.  Skin: Skin is warm and dry. No erythema. There is pallor (peri-oral, chronic).  Psychiatric: She has a normal mood and affect. Her behavior is normal.   Glucose 109 A1C 11/17 - 6.9     Assessment & Plan:  RTC in 3 months

## 2014-05-03 NOTE — Assessment & Plan Note (Signed)
I have refilled her pravastatin today.

## 2014-05-03 NOTE — Patient Instructions (Signed)
General Instructions: Please schedule a follow up visit within the next 3 months.   For your medications:   Thank you for bringing in your medications today.  It helps [place] take better care of you!   Please continue taking all of your medications as discussed today. A new prescription for metoprolol was sent today for the 25mg  tablets so that you do not have to cut them in half.  Your pravastatin was also refilled for your cholesterol.  You should start taking this medication.   Please go to have the xrays done today as requested by your oncologist.   Please call the clinic with any questions.  [phone]  Thank you!   Treatment Goals:  Goals (1 Years of Data) as of 05/03/14    None      Progress Toward Treatment Goals:  Treatment Goal 05/03/2014  Hemoglobin A1C at goal  Blood pressure at goal    Self Care Goals & Plans:  Self Care Goal 05/03/2014  Manage my medications take my medicines as prescribed; bring my medications to every visit; refill my medications on time  Monitor my health keep track of my blood glucose; bring my glucose meter and log to each visit; check my feet daily  Eat healthy foods eat more vegetables; eat foods that are low in salt; eat baked foods instead of fried foods  Be physically active find an activity I enjoy    Home Blood Glucose Monitoring 10/25/2012  Check my blood sugar (No Data)  When to check my blood sugar before breakfast     Care Management & Community Referrals:  Referral 10/25/2012  Referrals made for care management support none needed

## 2014-05-03 NOTE — Assessment & Plan Note (Signed)
BP Readings from Last 3 Encounters:  05/03/14 136/80  04/20/14 133/84  03/08/14 139/73    Lab Results  Component Value Date   NA 143 04/20/2014   K 4.9 04/20/2014   CREATININE 2.5* 04/20/2014    Assessment: Blood pressure control: controlled Progress toward BP goal:  at goal Comments: She did not take her medications this morning  Plan: Medications:  continue current medications, lisinopril, metoprolol Educational resources provided:   Self management tools provided:   Other plans: Renal function checked at nephrology office, stable, elevated.

## 2014-05-03 NOTE — Assessment & Plan Note (Addendum)
Patient with atrial fibrillation.  At previous visits, we discussed the risk/benefits of being on xarelto and she preferred to be on full dose aspirin, which is what she is taking.  She still has some easy bruising, but this is better than on xarelto.  She is taking her BID metoprolol, but did not take this morning and her HR is mildly elevated.  She will continue with the current therapy.

## 2014-05-03 NOTE — Assessment & Plan Note (Addendum)
Lab Results  Component Value Date   HGBA1C 7.2 12/07/2013   HGBA1C 6.8 10/25/2012   HGBA1C 6.7 06/28/2012     Assessment: Diabetes control: good control (HgbA1C at goal) Progress toward A1C goal:  at goal Comments: Most recent A1C is actually 6.9 from November of this year, at goal  Plan: Medications:  continue current medications, januvia and glipizide Home glucose monitoring: Frequency:   Timing:   Instruction/counseling given: reminded to get eye exam Educational resources provided:   Self management tools provided:   Other plans: She is due for an eye exam, she will need to have the records sent here.  She is wearing diabetic shoes for neuropathy.

## 2014-05-03 NOTE — Assessment & Plan Note (Signed)
Mammogram: Negative 07/2010, advised to reschedule Pneumonia: 23 done, refused prevnar Influenza: Refused Tetanus: refused DEXA: Due Last pap smear unknown, will need to discuss at next visit.

## 2014-05-06 ENCOUNTER — Other Ambulatory Visit: Payer: Self-pay | Admitting: Nurse Practitioner

## 2014-05-18 ENCOUNTER — Other Ambulatory Visit (HOSPITAL_BASED_OUTPATIENT_CLINIC_OR_DEPARTMENT_OTHER): Payer: PRIVATE HEALTH INSURANCE

## 2014-05-18 ENCOUNTER — Ambulatory Visit: Payer: PRIVATE HEALTH INSURANCE

## 2014-05-18 ENCOUNTER — Ambulatory Visit (HOSPITAL_BASED_OUTPATIENT_CLINIC_OR_DEPARTMENT_OTHER): Payer: PRIVATE HEALTH INSURANCE | Admitting: Hematology

## 2014-05-18 ENCOUNTER — Encounter: Payer: Self-pay | Admitting: Hematology

## 2014-05-18 ENCOUNTER — Telehealth: Payer: Self-pay | Admitting: Hematology

## 2014-05-18 VITALS — BP 157/90 | HR 109 | Temp 98.2°F | Resp 18 | Ht 66.0 in | Wt 194.0 lb

## 2014-05-18 DIAGNOSIS — N189 Chronic kidney disease, unspecified: Secondary | ICD-10-CM

## 2014-05-18 DIAGNOSIS — D649 Anemia, unspecified: Secondary | ICD-10-CM

## 2014-05-18 DIAGNOSIS — I10 Essential (primary) hypertension: Secondary | ICD-10-CM

## 2014-05-18 DIAGNOSIS — R600 Localized edema: Secondary | ICD-10-CM

## 2014-05-18 DIAGNOSIS — D472 Monoclonal gammopathy: Secondary | ICD-10-CM

## 2014-05-18 LAB — CBC WITH DIFFERENTIAL/PLATELET
BASO%: 0.7 % (ref 0.0–2.0)
BASOS ABS: 0 10*3/uL (ref 0.0–0.1)
EOS%: 5.2 % (ref 0.0–7.0)
Eosinophils Absolute: 0.4 10*3/uL (ref 0.0–0.5)
HCT: 33.9 % — ABNORMAL LOW (ref 34.8–46.6)
HGB: 10.3 g/dL — ABNORMAL LOW (ref 11.6–15.9)
LYMPH%: 21 % (ref 14.0–49.7)
MCH: 26.9 pg (ref 25.1–34.0)
MCHC: 30.3 g/dL — ABNORMAL LOW (ref 31.5–36.0)
MCV: 89 fL (ref 79.5–101.0)
MONO#: 0.8 10*3/uL (ref 0.1–0.9)
MONO%: 11.5 % (ref 0.0–14.0)
NEUT#: 4.3 10*3/uL (ref 1.5–6.5)
NEUT%: 61.6 % (ref 38.4–76.8)
Platelets: 147 10*3/uL (ref 145–400)
RBC: 3.81 10*6/uL (ref 3.70–5.45)
RDW: 15.3 % — AB (ref 11.2–14.5)
WBC: 7 10*3/uL (ref 3.9–10.3)
lymph#: 1.5 10*3/uL (ref 0.9–3.3)

## 2014-05-18 LAB — COMPREHENSIVE METABOLIC PANEL (CC13)
ALK PHOS: 93 U/L (ref 40–150)
ALT: 9 U/L (ref 0–55)
AST: 14 U/L (ref 5–34)
Albumin: 3.7 g/dL (ref 3.5–5.0)
Anion Gap: 11 mEq/L (ref 3–11)
BUN: 38.7 mg/dL — AB (ref 7.0–26.0)
CALCIUM: 8.6 mg/dL (ref 8.4–10.4)
CHLORIDE: 109 meq/L (ref 98–109)
CO2: 25 mEq/L (ref 22–29)
CREATININE: 2.2 mg/dL — AB (ref 0.6–1.1)
EGFR: 24 mL/min/{1.73_m2} — ABNORMAL LOW (ref >90)
Glucose: 74 mg/dl (ref 70–140)
Potassium: 4.5 mEq/L (ref 3.5–5.1)
Sodium: 145 mEq/L (ref 136–145)
Total Bilirubin: 0.64 mg/dL (ref 0.20–1.20)
Total Protein: 7.3 g/dL (ref 6.4–8.3)

## 2014-05-18 NOTE — Progress Notes (Signed)
[place] OFFICE PROGRESS NOTE  [name], MD [place] [address]  DIAGNOSIS: Anemia, unspecified anemia type   Chief Complaint  Patient presents with  . Follow-up   CURRENT THERAPY: Vitamin B12 shots every 8 weeks and aranesp 200 mcg every 4 weeks for a hemoglobin less than or equal to 11, started on 01/09/2013. Her last injections were on 03/08/2014  PROBLEM LIST:  1. Chronic anemia.  2. Renal insufficiency.  3. Atrial fibrillation.  4. 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  5. Low vitamin B12 level.  6. Osteomyelitis of the left foot status post left mid foot amputation  on 05/30/2010.  7. Status post amputation of toes of right foot in December 2012.  8. Hypertension.  9. Diabetes mellitus.  10.Venous insufficiency.  11.Dyslipidemia.  12.Status post total thyroidectomy for multinodular goiter on  03/13/2006.  13.History of asthma.  14.Thrombocytopenia with variable platelet count first noted 08/2010,  suspect chronic idiopathic thrombocytopenic purpura.   INTERVAL HISTORY: [name] 79 y.o. female with a history of chronic kidney disease complicated by anemia and Vitmain B12 deficiency is here for follow-up.  She was last seen by Dr. Juliann Mule 6 months ago. She has been getting the above B12 and iron injection every 1-2 months.   She comes in today by herself. She was quite drowsy and appears fatigued when I saw her. She states her fatigue has been slightly worse daily, but otherwise she feels well in general.  She lives alone in a senior citzens complex.  She handles her basic and intermediate ADLs without difficulty.  She denies any significant pain, dyspnea, GI symptoms. Her appetite is fair, no recent weight loss.  She feels about the same. She developed bilateral leg swelling about 2 weeks ago, no calf pain, no dyspnea, she walks with a walker, she lives alone, has a aids at home. She came  in today with her daughter.   I scheduled her bone marrow biopsy on last visit, when IR scheduler called her, she said she will call back which she never did. Her daughter is not aware of this. She appears having memory issue.    MEDICAL HISTORY: Past Medical History  Diagnosis Date  . Hypertension   . Hyperlipidemia   . Chronic venous insufficiency   . Osteomyelitis     s/p Rt 2nd toe and left 5 toes  amputation in 1/12  by Dr. Sharol Given  . Diabetes mellitus   . Anemia     b12 def, iron def, follow at cancer center, gets B12 and  another injection there. Could not remeber the name. Dr. Ralene Ok is her  cancer doctor  . Thyroid disease     hypothyroidism h/o hyperthyroidism s/p ablation/ectomy  . Asthma   . Dysrhythmia     atrial fibb  . Shortness of breath   . Blood transfusion     two or more yrs ago  . Hypothyroidism     thyroid removed 4 or more yrs ago  . Atrial fibrillation   . Chronic renal insufficiency      ALLERGIES:  has No Known Allergies.  MEDICATIONS: has a current medication list which includes the following prescription(s): aspirin, calcitriol, glipizide, glucose blood, januvia, lancets 30g, levothyroxine, lisinopril, metoprolol tartrate, pantoprazole, and pravastatin.  SURGICAL HISTORY:  Past Surgical History  Procedure Laterality Date  . Eye surgery      cat ext ou  . Appendectomy  teenager  . Amputation  04/30/2011    Procedure: AMPUTATION FOOT;  Surgeon: Newt Minion, MD;  Location: [facility];  Service: Orthopedics;  Laterality: Right;  Right Midfoot Amputation    REVIEW OF SYSTEMS:   Constitutional: Denies fevers, chills or abnormal weight loss Eyes: Denies blurriness of vision Ears, nose, mouth, throat, and face: Denies mucositis or sore throat Respiratory: Denies cough, dyspnea or wheezes Cardiovascular: Denies palpitation, chest discomfort or lower extremity swelling Gastrointestinal:  Denies nausea, heartburn or change in bowel habits Skin:  Denies abnormal skin rashes Lymphatics: Denies new lymphadenopathy or easy bruising Neurological:Denies numbness, tingling or new weaknesses Behavioral/Psych: Mood is stable, no new changes  All other systems were reviewed with the patient and are negative.  PHYSICAL EXAMINATION: ECOG PERFORMANCE STATUS: 1 - Symptomatic but completely ambulatory  Blood pressure 157/90, pulse 109, temperature 98.2 F (36.8 C), temperature source Oral, resp. rate 18, height 5' 6"  (1.676 m), weight 194 lb (87.998 kg), SpO2 100 %.  GENERAL:alert, no distress and comfortable; chronically ill elderly female who uses a walker.  SKIN: skin color, texture, turgor are normal, no rashes or significant lesions EYES: normal, Conjunctiva are pink and non-injected, sclera clear OROPHARYNX:no exudate, no erythema and lips, buccal mucosa, and tongue normal  NECK: supple, thyroid normal size, non-tender, without nodularity LYMPH:  no palpable lymphadenopathy in the cervical, axillary or supraclavicular LUNGS: clear to auscultation and percussion with normal breathing effort HEART: irregular rate & rhythm and no murmurs and 1+ lower extremity edema ABDOMEN:abdomen soft, non-tender and normal bowel sounds Musculoskeletal:no cyanosis of digits and no clubbing; s/p bilateral toes amputations as noted above.  NEURO: alert & oriented x 3 with fluent speech, ambulates via a walker  LABORATORY DATA: CBC Latest Ref Rng 05/18/2014 04/20/2014 03/08/2014  WBC 3.9 - 10.3 10e3/uL 7.0 6.5 7.2  Hemoglobin 11.6 - 15.9 g/dL 10.3(L) 9.2(L) 10.1(L)  Hematocrit 34.8 - 46.6 % 33.9(L) 29.8(L) 32.3(L)  Platelets 145 - 400 10e3/uL 147 140(L) 126(L)    CMP Latest Ref Rng 05/18/2014 04/20/2014 09/21/2013  Glucose 70 - 140 mg/dl 74 83 79  BUN 7.0 - 26.0 mg/dL 38.7(H) 49.0(H) 57.1(H)  Creatinine 0.6 - 1.1 mg/dL 2.2(H) 2.5(H) 2.6(H)  Sodium 136 - 145 mEq/L 145 143 142  Potassium 3.5 - 5.1 mEq/L 4.5 4.9 4.3  Chloride 96 - 112 mEq/L - - -  CO2 22  - 29 mEq/L 25 21(L) 21(L)  Calcium 8.4 - 10.4 mg/dL 8.6 8.7 8.8  Total Protein 6.4 - 8.3 g/dL 7.3 7.0 7.3  Total Bilirubin 0.20 - 1.20 mg/dL 0.64 0.41 0.36  Alkaline Phos 40 - 150 U/L 93 84 89  AST 5 - 34 U/L 14 12 14   ALT 0 - 55 U/L 9 10 8    Kappa/lambda light chains  Status: Finalresult Visible to patient:  MyChart Nextappt: 06/22/2014 at 10:15 AM in Oncology [facility] 4) Dx:  MGUS (monoclonal gammopathy of unknow...              Ref Range 4wk ago  30moago     Kappa free light chain 0.33 - 1.94 mg/dL 74.60 (H) 30.30 (H)    Lambda Free Lght Chn 0.57 - 2.63 mg/dL 4.77 (H) 3.15 (H)    Kappa:Lambda Ratio 0.26 - 1.65  15.64 (H) 9.62 (H)       Kappa/lambda light chains  Status: Finalresult Visible to patient:  MyChart Nextappt: 06/22/2014 at 10:15 AM in [facility] Dx:  MGUS (monoclonal gammopathy of unknow..Marland KitchenMarland Kitchen  Ref Range 4wk ago  65moago     Kappa free light chain 0.33 - 1.94 mg/dL 74.60 (H) 30.30 (H)    Lambda Free Lght Chn 0.57 - 2.63 mg/dL 4.77 (H) 3.15 (H)    Kappa:Lambda Ratio 0.26 - 1.65  15.64 (H) 9.62 (H)         RADIOGRAPHIC STUDIES: 1. Digital screening mammogram on 07/26/2010 was negative.  2. MRI of the right forefoot without IV contrast on 08/20/2010 showed  apparent new soft tissue ulceration distal to the third metatarsal status post prior resection of the third toe. No soft tissue abscess was demonstrated. There was interval resolution of the previously demonstrated marrow edema within the third metatarsal. No evidence for osteomyelitis. There was stable mid foot degenerative changes and chronic myopathic changes.  3. CT of the cervical spine without IV contrast on 10/21/2010 showed  degenerative changes in the cervical spine. No displaced fractures  were identified.  4. CT of the head without IV contrast on 10/21/2010 showed no evidence  for acute intracranial hemorrhage,  mass lesion or acute infarct. There was some mild diffuse atrophy. There was a subcutaneous hematoma over the left anterior frontal/periorbital region. No underlying skull fracture or acute intracranial abnormality.  5. Chest x-ray, 2 view, from 04/28/2011 showed stable moderate  cardiomegaly with no active lung disease. There was probable mild  scarring at the lung bases.   ASSESSMENT: BMISHEL SANS89y.o. female with a history of multiple comorbidities, including anemia, IgA MGUS, renal failure, B12 deficiency, etc.  1. Anemia and MGUS, rule out MM  --her anemia has been getting worse, despite Arenasp injection every 1-2 months. -Given her history of IgA MGUS, renal failure, and worsening anemia, multiple myeloma needs to be ruled out. Her previous M protein level was low, IgA and light chain levels are elevated. - I recommend her to have a skeletal survey and a bone marrow biopsy, which was never done before. She agreed but failed to schedule. We'll reschedule her bone marrow biopsy by IR as outpatient today. -Her hemoglobin is 10.3 today, I would hold Aranesp today and next time, before her bone marrow biopsy.  2. Hypertension, diabetes, A. Fib, CKD, and other medical problems -She will continue follow-up with her primary care physician. She is not on Anticoagulation for A. Fib  3. Bilateral leg edema -I'll obtain a Doppler of her extremities to ruled out DVT.   All questions were answered. The patient knows to call the clinic with any problems, questions or concerns. We can certainly see the patient much sooner if necessary.  Plan -reschedule bone marrow biopsy today -hold Aranesp today and resume if needed next month RTC in one month with lab   I spent 15 minutes counseling the patient face to face. The total time spent in the appointment was 20 minutes.    FTruitt Merle MD 05/18/2014 10:10 AM

## 2014-05-18 NOTE — Telephone Encounter (Signed)
Lm to confirm appt for Feb. Mailed cal & avs.

## 2014-05-19 DIAGNOSIS — E119 Type 2 diabetes mellitus without complications: Secondary | ICD-10-CM | POA: Diagnosis not present

## 2014-05-20 DIAGNOSIS — E119 Type 2 diabetes mellitus without complications: Secondary | ICD-10-CM | POA: Diagnosis not present

## 2014-05-21 DIAGNOSIS — E119 Type 2 diabetes mellitus without complications: Secondary | ICD-10-CM | POA: Diagnosis not present

## 2014-05-22 DIAGNOSIS — E119 Type 2 diabetes mellitus without complications: Secondary | ICD-10-CM | POA: Diagnosis not present

## 2014-05-23 ENCOUNTER — Telehealth: Payer: Self-pay | Admitting: *Deleted

## 2014-05-23 DIAGNOSIS — E119 Type 2 diabetes mellitus without complications: Secondary | ICD-10-CM | POA: Diagnosis not present

## 2014-05-23 NOTE — Telephone Encounter (Signed)
Called daughter [name] at home and left message on voice mail re:  1.  Confirmed that pt has appt for doppler of bilateral legs scheduled for 05/24/14 at 0900. 2.  Asked [name] to call nurse back to discuss about bone marrow biopsy that Dr. Burr Medico would like for pt to proceed.

## 2014-05-24 ENCOUNTER — Telehealth: Payer: Self-pay | Admitting: *Deleted

## 2014-05-24 ENCOUNTER — Telehealth: Payer: Self-pay | Admitting: Hematology

## 2014-05-24 ENCOUNTER — Ambulatory Visit (HOSPITAL_COMMUNITY): Payer: Medicare Other

## 2014-05-24 DIAGNOSIS — E119 Type 2 diabetes mellitus without complications: Secondary | ICD-10-CM | POA: Diagnosis not present

## 2014-05-24 NOTE — Telephone Encounter (Signed)
Spoke with daughter [name] today and was informed that pt has appt for doppler study on  05/25/14 at 0900 @ [facility].  [name] stated she was in agreement with Dr. Burr Medico that pt should have bone marrow biopsy done for further evaluation.  Instructed [name] that radiology scheduler will contact her with details about procedure with date and time. Spoke with Solmon Ice, radiology scheduler.  Gave her Rachel's phone number to contact when procedure is scheduled for pt.

## 2014-05-24 NOTE — Telephone Encounter (Signed)
S/w Vaughan Basta prior approval was completed.... KJ

## 2014-05-25 ENCOUNTER — Ambulatory Visit (HOSPITAL_COMMUNITY)
Admission: RE | Admit: 2014-05-25 | Discharge: 2014-05-25 | Disposition: A | Discharge status: Home / Self Care | Payer: Medicare Other | Source: Ambulatory Visit | Attending: Internal Medicine | Admitting: Internal Medicine

## 2014-05-25 ENCOUNTER — Ambulatory Visit: Payer: PRIVATE HEALTH INSURANCE

## 2014-05-25 ENCOUNTER — Telehealth: Payer: Self-pay | Admitting: *Deleted

## 2014-05-25 ENCOUNTER — Other Ambulatory Visit: Payer: PRIVATE HEALTH INSURANCE

## 2014-05-25 DIAGNOSIS — D472 Monoclonal gammopathy: Secondary | ICD-10-CM

## 2014-05-25 DIAGNOSIS — R609 Edema, unspecified: Secondary | ICD-10-CM | POA: Diagnosis not present

## 2014-05-25 DIAGNOSIS — R600 Localized edema: Secondary | ICD-10-CM

## 2014-05-25 DIAGNOSIS — M7989 Other specified soft tissue disorders: Secondary | ICD-10-CM | POA: Insufficient documentation

## 2014-05-25 DIAGNOSIS — E119 Type 2 diabetes mellitus without complications: Secondary | ICD-10-CM | POA: Diagnosis not present

## 2014-05-25 NOTE — Progress Notes (Signed)
VASCULAR LAB PRELIMINARY  PRELIMINARY  PRELIMINARY  PRELIMINARY  Bilateral lower extremity venous duplex  completed.    Preliminary report:  Bilateral:  No evidence of DVT, superficial thrombosis, or Baker's Cyst.    Cornella Emmer, RVT 05/25/2014, 9:29 AM

## 2014-05-25 NOTE — Telephone Encounter (Signed)
Received vm call from [facility] stating that preliminary report is negattive for DVT & pt sent home.  Note to Dr Burr Medico.

## 2014-05-26 DIAGNOSIS — E119 Type 2 diabetes mellitus without complications: Secondary | ICD-10-CM | POA: Diagnosis not present

## 2014-05-27 DIAGNOSIS — E119 Type 2 diabetes mellitus without complications: Secondary | ICD-10-CM | POA: Diagnosis not present

## 2014-05-28 DIAGNOSIS — E119 Type 2 diabetes mellitus without complications: Secondary | ICD-10-CM | POA: Diagnosis not present

## 2014-05-29 DIAGNOSIS — E119 Type 2 diabetes mellitus without complications: Secondary | ICD-10-CM | POA: Diagnosis not present

## 2014-05-30 DIAGNOSIS — E119 Type 2 diabetes mellitus without complications: Secondary | ICD-10-CM | POA: Diagnosis not present

## 2014-05-31 DIAGNOSIS — E119 Type 2 diabetes mellitus without complications: Secondary | ICD-10-CM | POA: Diagnosis not present

## 2014-06-01 DIAGNOSIS — E119 Type 2 diabetes mellitus without complications: Secondary | ICD-10-CM | POA: Diagnosis not present

## 2014-06-02 DIAGNOSIS — E119 Type 2 diabetes mellitus without complications: Secondary | ICD-10-CM | POA: Diagnosis not present

## 2014-06-03 DIAGNOSIS — E119 Type 2 diabetes mellitus without complications: Secondary | ICD-10-CM | POA: Diagnosis not present

## 2014-06-04 DIAGNOSIS — E119 Type 2 diabetes mellitus without complications: Secondary | ICD-10-CM | POA: Diagnosis not present

## 2014-06-05 DIAGNOSIS — E119 Type 2 diabetes mellitus without complications: Secondary | ICD-10-CM | POA: Diagnosis not present

## 2014-06-06 DIAGNOSIS — E119 Type 2 diabetes mellitus without complications: Secondary | ICD-10-CM | POA: Diagnosis not present

## 2014-06-07 DIAGNOSIS — E119 Type 2 diabetes mellitus without complications: Secondary | ICD-10-CM | POA: Diagnosis not present

## 2014-06-08 DIAGNOSIS — E119 Type 2 diabetes mellitus without complications: Secondary | ICD-10-CM | POA: Diagnosis not present

## 2014-06-09 DIAGNOSIS — E119 Type 2 diabetes mellitus without complications: Secondary | ICD-10-CM | POA: Diagnosis not present

## 2014-06-10 DIAGNOSIS — E119 Type 2 diabetes mellitus without complications: Secondary | ICD-10-CM | POA: Diagnosis not present

## 2014-06-11 DIAGNOSIS — E119 Type 2 diabetes mellitus without complications: Secondary | ICD-10-CM | POA: Diagnosis not present

## 2014-06-12 ENCOUNTER — Other Ambulatory Visit (HOSPITAL_COMMUNITY): Payer: Medicare Other

## 2014-06-12 ENCOUNTER — Ambulatory Visit (HOSPITAL_COMMUNITY): Admission: RE | Admit: 2014-06-12 | Payer: Medicare Other | Source: Ambulatory Visit

## 2014-06-12 ENCOUNTER — Other Ambulatory Visit: Payer: Self-pay | Admitting: Radiology

## 2014-06-12 DIAGNOSIS — E119 Type 2 diabetes mellitus without complications: Secondary | ICD-10-CM | POA: Diagnosis not present

## 2014-06-13 ENCOUNTER — Ambulatory Visit (HOSPITAL_COMMUNITY): Payer: Medicare Other

## 2014-06-13 DIAGNOSIS — E119 Type 2 diabetes mellitus without complications: Secondary | ICD-10-CM | POA: Diagnosis not present

## 2014-06-14 DIAGNOSIS — E119 Type 2 diabetes mellitus without complications: Secondary | ICD-10-CM | POA: Diagnosis not present

## 2014-06-15 DIAGNOSIS — E119 Type 2 diabetes mellitus without complications: Secondary | ICD-10-CM | POA: Diagnosis not present

## 2014-06-16 DIAGNOSIS — E119 Type 2 diabetes mellitus without complications: Secondary | ICD-10-CM | POA: Diagnosis not present

## 2014-06-17 DIAGNOSIS — E119 Type 2 diabetes mellitus without complications: Secondary | ICD-10-CM | POA: Diagnosis not present

## 2014-06-18 DIAGNOSIS — E119 Type 2 diabetes mellitus without complications: Secondary | ICD-10-CM | POA: Diagnosis not present

## 2014-06-19 DIAGNOSIS — E119 Type 2 diabetes mellitus without complications: Secondary | ICD-10-CM | POA: Diagnosis not present

## 2014-06-20 DIAGNOSIS — E119 Type 2 diabetes mellitus without complications: Secondary | ICD-10-CM | POA: Diagnosis not present

## 2014-06-21 DIAGNOSIS — E119 Type 2 diabetes mellitus without complications: Secondary | ICD-10-CM | POA: Diagnosis not present

## 2014-06-22 ENCOUNTER — Ambulatory Visit: Payer: PRIVATE HEALTH INSURANCE

## 2014-06-22 ENCOUNTER — Ambulatory Visit: Payer: Medicare Other

## 2014-06-22 ENCOUNTER — Telehealth: Payer: Self-pay | Admitting: Hematology

## 2014-06-22 ENCOUNTER — Ambulatory Visit (HOSPITAL_BASED_OUTPATIENT_CLINIC_OR_DEPARTMENT_OTHER): Payer: Medicare Other | Admitting: Hematology

## 2014-06-22 ENCOUNTER — Encounter: Payer: Self-pay | Admitting: Hematology

## 2014-06-22 ENCOUNTER — Other Ambulatory Visit: Payer: PRIVATE HEALTH INSURANCE

## 2014-06-22 ENCOUNTER — Ambulatory Visit (HOSPITAL_BASED_OUTPATIENT_CLINIC_OR_DEPARTMENT_OTHER): Payer: Medicare Other

## 2014-06-22 VITALS — BP 125/69 | HR 108 | Temp 98.0°F | Resp 18 | Ht 66.0 in | Wt 187.1 lb

## 2014-06-22 DIAGNOSIS — D472 Monoclonal gammopathy: Secondary | ICD-10-CM

## 2014-06-22 DIAGNOSIS — N289 Disorder of kidney and ureter, unspecified: Secondary | ICD-10-CM | POA: Diagnosis not present

## 2014-06-22 DIAGNOSIS — E538 Deficiency of other specified B group vitamins: Secondary | ICD-10-CM

## 2014-06-22 DIAGNOSIS — E119 Type 2 diabetes mellitus without complications: Secondary | ICD-10-CM | POA: Diagnosis not present

## 2014-06-22 DIAGNOSIS — I10 Essential (primary) hypertension: Secondary | ICD-10-CM | POA: Diagnosis not present

## 2014-06-22 DIAGNOSIS — D649 Anemia, unspecified: Secondary | ICD-10-CM

## 2014-06-22 DIAGNOSIS — N189 Chronic kidney disease, unspecified: Principal | ICD-10-CM

## 2014-06-22 DIAGNOSIS — I4891 Unspecified atrial fibrillation: Secondary | ICD-10-CM | POA: Diagnosis not present

## 2014-06-22 DIAGNOSIS — D631 Anemia in chronic kidney disease: Secondary | ICD-10-CM

## 2014-06-22 LAB — CBC WITH DIFFERENTIAL/PLATELET
BASO%: 0.4 % (ref 0.0–2.0)
Basophils Absolute: 0 10*3/uL (ref 0.0–0.1)
EOS%: 9.1 % — AB (ref 0.0–7.0)
Eosinophils Absolute: 0.7 10*3/uL — ABNORMAL HIGH (ref 0.0–0.5)
HCT: 35.1 % (ref 34.8–46.6)
HGB: 10.7 g/dL — ABNORMAL LOW (ref 11.6–15.9)
LYMPH#: 1.9 10*3/uL (ref 0.9–3.3)
LYMPH%: 24.8 % (ref 14.0–49.7)
MCH: 27 pg (ref 25.1–34.0)
MCHC: 30.5 g/dL — AB (ref 31.5–36.0)
MCV: 88.4 fL (ref 79.5–101.0)
MONO#: 0.9 10*3/uL (ref 0.1–0.9)
MONO%: 11.2 % (ref 0.0–14.0)
NEUT#: 4.1 10*3/uL (ref 1.5–6.5)
NEUT%: 54.5 % (ref 38.4–76.8)
Platelets: 147 10*3/uL (ref 145–400)
RBC: 3.97 10*6/uL (ref 3.70–5.45)
RDW: 15 % — ABNORMAL HIGH (ref 11.2–14.5)
WBC: 7.6 10*3/uL (ref 3.9–10.3)

## 2014-06-22 LAB — COMPREHENSIVE METABOLIC PANEL (CC13)
ALBUMIN: 3.8 g/dL (ref 3.5–5.0)
ALT: 6 U/L (ref 0–55)
ANION GAP: 12 meq/L — AB (ref 3–11)
AST: 11 U/L (ref 5–34)
Alkaline Phosphatase: 88 U/L (ref 40–150)
BILIRUBIN TOTAL: 0.5 mg/dL (ref 0.20–1.20)
BUN: 36.8 mg/dL — AB (ref 7.0–26.0)
CO2: 22 mEq/L (ref 22–29)
Calcium: 8.8 mg/dL (ref 8.4–10.4)
Chloride: 110 mEq/L — ABNORMAL HIGH (ref 98–109)
Creatinine: 2.5 mg/dL — ABNORMAL HIGH (ref 0.6–1.1)
EGFR: 21 mL/min/{1.73_m2} — AB (ref >90)
Glucose: 88 mg/dl (ref 70–140)
Potassium: 5 mEq/L (ref 3.5–5.1)
SODIUM: 144 meq/L (ref 136–145)
Total Protein: 7.5 g/dL (ref 6.4–8.3)

## 2014-06-22 NOTE — Progress Notes (Signed)
[place] OFFICE PROGRESS NOTE  [name], MD [place] [address]  DIAGNOSIS: Anemia, unspecified anemia type  CHIEF COMPLAIN: Follow up MGUS and anemia.  CURRENT THERAPY: Vitamin B12 shots every 8 weeks and aranesp 200 mcg every 4 weeks for a hemoglobin less than or equal to 11, started on 01/09/2013. Her last injections were on 03/08/2014  PROBLEM LIST:  1. Chronic anemia.  2. Renal insufficiency.  3. Atrial fibrillation.  4. 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  5. Low vitamin B12 level.  6. Osteomyelitis of the left foot status post left mid foot amputation  on 05/30/2010.  7. Status post amputation of toes of right foot in December 2012.  8. Hypertension.  9. Diabetes mellitus.  10.Venous insufficiency.  11.Dyslipidemia.  12.Status post total thyroidectomy for multinodular goiter on  03/13/2006.  13.History of asthma.  14.Thrombocytopenia with variable platelet count first noted 08/2010,  suspect chronic idiopathic thrombocytopenic purpura.   INTERVAL HISTORY: [name] 80 y.o. female with a history of chronic kidney disease complicated by anemia and Vitmain B12 deficiency is here for follow-up.  She was last seen by Dr. Juliann Mule 6 months ago. She has been getting the above B12 and iron injection every 1-2 months.   She comes in today by herself with a walker. She still has moderate fatigue, which has not significant changed lately. Her appetite is also moderate to low, no recent weight loss. No fever, new pain or other complaints. She has chronic low back pain. Her bone marrow biopsy was scheduled a few weeks ago, but she canceled due to the weather, and did not want to reschedule.   MEDICAL HISTORY: Past Medical History  Diagnosis Date  . Hypertension   . Hyperlipidemia   . Chronic venous insufficiency   . Osteomyelitis     s/p Rt 2nd toe and left 5 toes  amputation in 1/12  by Dr. Sharol Given   . Diabetes mellitus   . Anemia     b12 def, iron def, follow at cancer center, gets B12 and  another injection there. Could not remeber the name. Dr. Ralene Ok is her  cancer doctor  . Thyroid disease     hypothyroidism h/o hyperthyroidism s/p ablation/ectomy  . Asthma   . Dysrhythmia     atrial fibb  . Shortness of breath   . Blood transfusion     two or more yrs ago  . Hypothyroidism     thyroid removed 4 or more yrs ago  . Atrial fibrillation   . Chronic renal insufficiency      ALLERGIES:  has No Known Allergies.  MEDICATIONS: has a current medication list which includes the following prescription(s): aspirin, calcitriol, glipizide, glucose blood, januvia, lancets 30g, levothyroxine, lisinopril, metoprolol tartrate, pantoprazole, and pravastatin.  SURGICAL HISTORY:  Past Surgical History  Procedure Laterality Date  . Eye surgery      cat ext ou  . Appendectomy      teenager  . Amputation  04/30/2011    Procedure: AMPUTATION FOOT;  Surgeon: Newt Minion, MD;  Location: [place];  Service: Orthopedics;  Laterality: Right;  Right Midfoot Amputation    REVIEW OF SYSTEMS:   Constitutional: Denies fevers, chills or abnormal weight loss Eyes: Denies blurriness of vision Ears, nose, mouth, throat, and face: Denies mucositis or sore throat Respiratory: Denies cough, dyspnea or wheezes Cardiovascular: Denies palpitation, chest discomfort or lower extremity swelling Gastrointestinal:  Denies  nausea, heartburn or change in bowel habits Skin: Denies abnormal skin rashes Lymphatics: Denies new lymphadenopathy or easy bruising Neurological:Denies numbness, tingling or new weaknesses Behavioral/Psych: Mood is stable, no new changes  All other systems were reviewed with the patient and are negative.  PHYSICAL EXAMINATION: ECOG PERFORMANCE STATUS: 1 - Symptomatic but completely ambulatory  Blood pressure 125/69, pulse 108, temperature 98 F (36.7 C), temperature source Oral, resp.  rate 18, height _0  (1.676 m), weight 187 lb 1.6 oz (84.868 kg), SpO2 98 %.  GENERAL:alert, no distress and comfortable; chronically ill elderly female who uses a walker.  SKIN: skin color, texture, turgor are normal, no rashes or significant lesions EYES: normal, Conjunctiva are pink and non-injected, sclera clear OROPHARYNX:no exudate, no erythema and lips, buccal mucosa, and tongue normal  NECK: supple, thyroid normal size, non-tender, without nodularity LYMPH:  no palpable lymphadenopathy in the cervical, axillary or supraclavicular LUNGS: clear to auscultation and percussion with normal breathing effort HEART: irregular rate & rhythm and no murmurs and 1+ lower extremity edema ABDOMEN:abdomen soft, non-tender and normal bowel sounds Musculoskeletal:no cyanosis of digits and no clubbing; s/p bilateral toes amputations as noted above.  NEURO: alert & oriented x 3 with fluent speech, ambulates via a walker  LABORATORY DATA: CBC Latest Ref Rng 06/22/2014 05/18/2014 04/20/2014  WBC 3.9 - 10.3 10e3/uL 7.6 7.0 6.5  Hemoglobin 11.6 - 15.9 g/dL 10.7(L) 10.3(L) 9.2(L)  Hematocrit 34.8 - 46.6 % 35.1 33.9(L) 29.8(L)  Platelets 145 - 400 10e3/uL 147 147 140(L)    CMP Latest Ref Rng 05/18/2014 04/20/2014 09/21/2013  Glucose 70 - 140 mg/dl 74 83 79  BUN 7.0 - 26.0 mg/dL 38.7(H) 49.0(H) 57.1(H)  Creatinine 0.6 - 1.1 mg/dL 2.2(H) 2.5(H) 2.6(H)  Sodium 136 - 145 mEq/L 145 143 142  Potassium 3.5 - 5.1 mEq/L 4.5 4.9 4.3  Chloride 96 - 112 mEq/L - - -  CO2 22 - 29 mEq/L 25 21(L) 21(L)  Calcium 8.4 - 10.4 mg/dL 8.6 8.7 8.8  Total Protein 6.4 - 8.3 g/dL 7.3 7.0 7.3  Total Bilirubin 0.20 - 1.20 mg/dL 0.64 0.41 0.36  Alkaline Phos 40 - 150 U/L 93 84 89  AST 5 - 34 U/L _1 ALT 0 - 55 U/L _2 Kappa/lambda light chains  Status: Finalresult Visible to patient:  MyChart Nextappt: 06/22/2014 at 10:15 AM in Oncology [facility] 4) Dx:  MGUS (monoclonal gammopathy of unknow...               Ref Range 4wk ago  53moago     Kappa free light chain 0.33 - 1.94 mg/dL 74.60 (H) 30.30 (H)    Lambda Free Lght Chn 0.57 - 2.63 mg/dL 4.77 (H) 3.15 (H)    Kappa:Lambda Ratio 0.26 - 1.65  15.64 (H) 9.62 (H)       Kappa/lambda light chains  Status: Finalresult Visible to patient:  MyChart Nextappt: 06/22/2014 at 10:15 AM [facility] Dx:  MGUS (monoclonal gammopathy of unknow...              Ref Range 4wk ago  728mogo     Kappa free light chain 0.33 - 1.94 mg/dL 74.60 (H) 30.30 (H)    Lambda Free Lght Chn 0.57 - 2.63 mg/dL 4.77 (H) 3.15 (H)    Kappa:Lambda Ratio 0.26 - 1.65  15.64 (H) 9.62 (H)         RADIOGRAPHIC STUDIES: 1. Digital screening mammogram on 07/26/2010 was  negative.  2. MRI of the right forefoot without IV contrast on 08/20/2010 showed  apparent new soft tissue ulceration distal to the third metatarsal status post prior resection of the third toe. No soft tissue abscess was demonstrated. There was interval resolution of the previously demonstrated marrow edema within the third metatarsal. No evidence for osteomyelitis. There was stable mid foot degenerative changes and chronic myopathic changes.  3. CT of the cervical spine without IV contrast on 10/21/2010 showed  degenerative changes in the cervical spine. No displaced fractures  were identified.  4. CT of the head without IV contrast on 10/21/2010 showed no evidence  for acute intracranial hemorrhage, mass lesion or acute infarct. There was some mild diffuse atrophy. There was a subcutaneous hematoma over the left anterior frontal/periorbital region. No underlying skull fracture or acute intracranial abnormality.  5. Chest x-ray, 2 view, from 04/28/2011 showed stable moderate  cardiomegaly with no active lung disease. There was probable mild  scarring at the lung bases.   ASSESSMENT: [name] 80 y.o. female with a history of multiple  comorbidities, including anemia, IgA MGUS, renal failure, B12 deficiency, etc.  1. Anemia and MGUS, rule out MM  --her anemia has been getting worse, despite Arenasp injection every 1-2 months. -Given her history of IgA MGUS, renal failure, and worsening anemia, multiple myeloma needs to be ruled out. Her previous M protein level was low, IgA and light chain levels are elevated. -Her Skeletal survey was normal.  -I discussed bone marrow biopsy again. Apparently, she is very hesitant to have the procedure done. I'll hold on for now. -Her hemoglobin is 10.7 today, I would hold Aranesp today.   2. Hypertension, diabetes, A. Fib, CKD, and other medical problems -She will continue follow-up with her primary care physician. She is not on Anticoagulation for A. Fib   All questions were answered. The patient knows to call the clinic with any problems, questions or concerns. We can certainly see the patient much sooner if necessary.  Plan -I will call her daughter -hold Aranesp today and resume if needed next month RTC in one month with lab CBC only   I spent 15 minutes counseling the patient face to face. The total time spent in the appointment was 20 minutes.    Truitt Merle, MD 06/22/2014   11:28 AM

## 2014-06-22 NOTE — Progress Notes (Signed)
Went over instructions on how to collect 24 hour urine with pt.  Pt stated she is aware and knows how to collect urine for 24 hour.

## 2014-06-22 NOTE — Telephone Encounter (Signed)
gv adn printed appt sched and avs for pt for March.... °

## 2014-06-23 DIAGNOSIS — E119 Type 2 diabetes mellitus without complications: Secondary | ICD-10-CM | POA: Diagnosis not present

## 2014-06-24 DIAGNOSIS — E119 Type 2 diabetes mellitus without complications: Secondary | ICD-10-CM | POA: Diagnosis not present

## 2014-06-25 DIAGNOSIS — E119 Type 2 diabetes mellitus without complications: Secondary | ICD-10-CM | POA: Diagnosis not present

## 2014-06-26 ENCOUNTER — Telehealth: Payer: Self-pay | Admitting: *Deleted

## 2014-06-26 ENCOUNTER — Other Ambulatory Visit: Payer: Self-pay | Admitting: Radiology

## 2014-06-26 DIAGNOSIS — E119 Type 2 diabetes mellitus without complications: Secondary | ICD-10-CM | POA: Diagnosis not present

## 2014-06-26 NOTE — Telephone Encounter (Signed)
VERBAL ORDER AND READ BACK TO DR.FENG- CANCEL PT.'S CT BX. NOTIFIED KEVIN IN CT AND CARLA VIA VOICE MAIL.

## 2014-06-27 DIAGNOSIS — E119 Type 2 diabetes mellitus without complications: Secondary | ICD-10-CM | POA: Diagnosis not present

## 2014-06-27 LAB — SPEP & IFE WITH QIG
ALBUMIN ELP: 55 % — AB (ref 55.8–66.1)
Alpha-1-Globulin: 4.6 % (ref 2.9–4.9)
Alpha-2-Globulin: 10.8 % (ref 7.1–11.8)
BETA GLOBULIN: 9.5 % — AB (ref 4.7–7.2)
Beta 2: 5.1 % (ref 3.2–6.5)
GAMMA GLOBULIN: 15 % (ref 11.1–18.8)
IGA: 654 mg/dL — AB (ref 69–380)
IgG (Immunoglobin G), Serum: 1390 mg/dL (ref 690–1700)
IgM, Serum: 62 mg/dL (ref 52–322)
M-Spike, %: 0.32 g/dL
Total Protein, Serum Electrophoresis: 7.4 g/dL (ref 6.0–8.3)

## 2014-06-27 LAB — KAPPA/LAMBDA LIGHT CHAINS
KAPPA FREE LGHT CHN: 58 mg/dL — AB (ref 0.33–1.94)
KAPPA LAMBDA RATIO: 12.78 — AB (ref 0.26–1.65)
Lambda Free Lght Chn: 4.54 mg/dL — ABNORMAL HIGH (ref 0.57–2.63)

## 2014-06-28 ENCOUNTER — Ambulatory Visit (HOSPITAL_COMMUNITY): Admission: RE | Admit: 2014-06-28 | Payer: Medicare Other | Source: Ambulatory Visit

## 2014-06-28 DIAGNOSIS — E119 Type 2 diabetes mellitus without complications: Secondary | ICD-10-CM | POA: Diagnosis not present

## 2014-06-29 DIAGNOSIS — E119 Type 2 diabetes mellitus without complications: Secondary | ICD-10-CM | POA: Diagnosis not present

## 2014-06-30 DIAGNOSIS — E119 Type 2 diabetes mellitus without complications: Secondary | ICD-10-CM | POA: Diagnosis not present

## 2014-07-01 DIAGNOSIS — E119 Type 2 diabetes mellitus without complications: Secondary | ICD-10-CM | POA: Diagnosis not present

## 2014-07-02 DIAGNOSIS — E119 Type 2 diabetes mellitus without complications: Secondary | ICD-10-CM | POA: Diagnosis not present

## 2014-07-03 DIAGNOSIS — E119 Type 2 diabetes mellitus without complications: Secondary | ICD-10-CM | POA: Diagnosis not present

## 2014-07-04 DIAGNOSIS — E119 Type 2 diabetes mellitus without complications: Secondary | ICD-10-CM | POA: Diagnosis not present

## 2014-07-05 DIAGNOSIS — E119 Type 2 diabetes mellitus without complications: Secondary | ICD-10-CM | POA: Diagnosis not present

## 2014-07-06 DIAGNOSIS — E119 Type 2 diabetes mellitus without complications: Secondary | ICD-10-CM | POA: Diagnosis not present

## 2014-07-07 DIAGNOSIS — E119 Type 2 diabetes mellitus without complications: Secondary | ICD-10-CM | POA: Diagnosis not present

## 2014-07-08 DIAGNOSIS — E119 Type 2 diabetes mellitus without complications: Secondary | ICD-10-CM | POA: Diagnosis not present

## 2014-07-09 DIAGNOSIS — E119 Type 2 diabetes mellitus without complications: Secondary | ICD-10-CM | POA: Diagnosis not present

## 2014-07-10 DIAGNOSIS — E119 Type 2 diabetes mellitus without complications: Secondary | ICD-10-CM | POA: Diagnosis not present

## 2014-07-11 DIAGNOSIS — E119 Type 2 diabetes mellitus without complications: Secondary | ICD-10-CM | POA: Diagnosis not present

## 2014-07-12 DIAGNOSIS — E119 Type 2 diabetes mellitus without complications: Secondary | ICD-10-CM | POA: Diagnosis not present

## 2014-07-13 DIAGNOSIS — E119 Type 2 diabetes mellitus without complications: Secondary | ICD-10-CM | POA: Diagnosis not present

## 2014-07-14 DIAGNOSIS — E119 Type 2 diabetes mellitus without complications: Secondary | ICD-10-CM | POA: Diagnosis not present

## 2014-07-15 DIAGNOSIS — E119 Type 2 diabetes mellitus without complications: Secondary | ICD-10-CM | POA: Diagnosis not present

## 2014-07-16 DIAGNOSIS — E119 Type 2 diabetes mellitus without complications: Secondary | ICD-10-CM | POA: Diagnosis not present

## 2014-07-17 DIAGNOSIS — E119 Type 2 diabetes mellitus without complications: Secondary | ICD-10-CM | POA: Diagnosis not present

## 2014-07-18 DIAGNOSIS — E119 Type 2 diabetes mellitus without complications: Secondary | ICD-10-CM | POA: Diagnosis not present

## 2014-07-19 ENCOUNTER — Telehealth: Payer: Self-pay | Admitting: Hematology

## 2014-07-19 ENCOUNTER — Telehealth: Payer: Self-pay | Admitting: *Deleted

## 2014-07-19 ENCOUNTER — Other Ambulatory Visit (HOSPITAL_BASED_OUTPATIENT_CLINIC_OR_DEPARTMENT_OTHER): Payer: Medicare Other

## 2014-07-19 ENCOUNTER — Ambulatory Visit (HOSPITAL_BASED_OUTPATIENT_CLINIC_OR_DEPARTMENT_OTHER): Payer: Medicare Other | Admitting: Hematology

## 2014-07-19 ENCOUNTER — Encounter: Payer: Self-pay | Admitting: Hematology

## 2014-07-19 VITALS — BP 142/65 | HR 98 | Temp 98.2°F | Resp 18 | Ht 66.0 in | Wt 184.0 lb

## 2014-07-19 DIAGNOSIS — E538 Deficiency of other specified B group vitamins: Secondary | ICD-10-CM

## 2014-07-19 DIAGNOSIS — I4891 Unspecified atrial fibrillation: Secondary | ICD-10-CM | POA: Diagnosis not present

## 2014-07-19 DIAGNOSIS — E119 Type 2 diabetes mellitus without complications: Secondary | ICD-10-CM

## 2014-07-19 DIAGNOSIS — M47819 Spondylosis without myelopathy or radiculopathy, site unspecified: Secondary | ICD-10-CM

## 2014-07-19 DIAGNOSIS — D631 Anemia in chronic kidney disease: Secondary | ICD-10-CM | POA: Diagnosis not present

## 2014-07-19 DIAGNOSIS — N189 Chronic kidney disease, unspecified: Secondary | ICD-10-CM

## 2014-07-19 DIAGNOSIS — D472 Monoclonal gammopathy: Secondary | ICD-10-CM

## 2014-07-19 DIAGNOSIS — D649 Anemia, unspecified: Secondary | ICD-10-CM

## 2014-07-19 DIAGNOSIS — N183 Chronic kidney disease, stage 3 (moderate): Principal | ICD-10-CM

## 2014-07-19 DIAGNOSIS — I10 Essential (primary) hypertension: Secondary | ICD-10-CM

## 2014-07-19 LAB — COMPREHENSIVE METABOLIC PANEL (CC13)
AST: 12 U/L (ref 5–34)
Albumin: 3.6 g/dL (ref 3.5–5.0)
Alkaline Phosphatase: 84 U/L (ref 40–150)
Anion Gap: 13 mEq/L — ABNORMAL HIGH (ref 3–11)
BILIRUBIN TOTAL: 0.54 mg/dL (ref 0.20–1.20)
BUN: 40.9 mg/dL — ABNORMAL HIGH (ref 7.0–26.0)
CO2: 21 meq/L — AB (ref 22–29)
Calcium: 9 mg/dL (ref 8.4–10.4)
Chloride: 109 mEq/L (ref 98–109)
Creatinine: 2.4 mg/dL — ABNORMAL HIGH (ref 0.6–1.1)
EGFR: 22 mL/min/{1.73_m2} — ABNORMAL LOW (ref >90)
Glucose: 80 mg/dl (ref 70–140)
POTASSIUM: 4.8 meq/L (ref 3.5–5.1)
SODIUM: 142 meq/L (ref 136–145)
TOTAL PROTEIN: 7.1 g/dL (ref 6.4–8.3)

## 2014-07-19 LAB — CBC WITH DIFFERENTIAL/PLATELET
BASO%: 0.7 % (ref 0.0–2.0)
BASOS ABS: 0 10*3/uL (ref 0.0–0.1)
EOS%: 6.7 % (ref 0.0–7.0)
Eosinophils Absolute: 0.5 10*3/uL (ref 0.0–0.5)
HEMATOCRIT: 33.8 % — AB (ref 34.8–46.6)
HEMOGLOBIN: 10.4 g/dL — AB (ref 11.6–15.9)
LYMPH%: 20.8 % (ref 14.0–49.7)
MCH: 26.6 pg (ref 25.1–34.0)
MCHC: 30.7 g/dL — ABNORMAL LOW (ref 31.5–36.0)
MCV: 86.6 fL (ref 79.5–101.0)
MONO#: 0.8 10*3/uL (ref 0.1–0.9)
MONO%: 10.6 % (ref 0.0–14.0)
NEUT#: 4.4 10*3/uL (ref 1.5–6.5)
NEUT%: 61.2 % (ref 38.4–76.8)
PLATELETS: 119 10*3/uL — AB (ref 145–400)
RBC: 3.9 10*6/uL (ref 3.70–5.45)
RDW: 16.1 % — ABNORMAL HIGH (ref 11.2–14.5)
WBC: 7.2 10*3/uL (ref 3.9–10.3)
lymph#: 1.5 10*3/uL (ref 0.9–3.3)

## 2014-07-19 MED ORDER — DARBEPOETIN ALFA 200 MCG/0.4ML IJ SOSY
200.0000 ug | PREFILLED_SYRINGE | Freq: Once | INTRAMUSCULAR | Status: AC
  Administered 2014-07-19: 200 ug via SUBCUTANEOUS
  Filled 2014-07-19: qty 0.4

## 2014-07-19 NOTE — Addendum Note (Signed)
Addended by: Elray Buba LE on: 07/19/2014 11:02 AM   Modules accepted: Orders

## 2014-07-19 NOTE — Telephone Encounter (Signed)
GAVE PT AVS REPORT AND APPT SCHEDULE FOR APRIL/MAY

## 2014-07-19 NOTE — Telephone Encounter (Signed)
[facility] case manager calls for pt, states she needs a new walker and diab shoes, could you please order these and speak w/ chilon about getting them for pt

## 2014-07-19 NOTE — Progress Notes (Signed)
[name] OFFICE PROGRESS NOTE  [name], MD [place] [address]  DIAGNOSIS: Anemia, unspecified anemia type  CHIEF COMPLAIN: Follow up MGUS and anemia.  CURRENT THERAPY: Vitamin B12 shots every 8 weeks and aranesp 200 mcg every 4 weeks for a hemoglobin less than or equal to 11, started on 01/09/2013. Her last injections were on 03/08/2014, and restarted on 07/19/2014   PROBLEM LIST:  1. Chronic anemia.  2. Renal insufficiency.  3. Atrial fibrillation.  4. 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  5. Low vitamin B12 level.  6. Osteomyelitis of the left foot status post left mid foot amputation  on 05/30/2010.  7. Status post amputation of toes of right foot in December 2012.  8. Hypertension.  9. Diabetes mellitus.  10.Venous insufficiency.  11.Dyslipidemia.  12.Status post total thyroidectomy for multinodular goiter on  03/13/2006.  13.History of asthma.  14.Thrombocytopenia with variable platelet count first noted 08/2010,  suspect chronic idiopathic thrombocytopenic purpura.   INTERVAL HISTORY: [name] 80 y.o. female with a history of chronic kidney disease complicated by anemia and [place] B12 deficiency is here for follow-up.    She comes in today by herself with a walker. She states she feels about the same, no new complaints. She still has moderate fatigue,she's able to take care of herself at home, she doesn't have a home aid. No recent weight loss. No fever, new pain or other complaints. She has chronic low back pain. No bleeding.   MEDICAL HISTORY: Past Medical History  Diagnosis Date  . Hypertension   . Hyperlipidemia   . Chronic venous insufficiency   . Osteomyelitis     s/p Rt 2nd toe and left 5 toes  amputation in 1/12  by Dr. Sharol Given  . Diabetes mellitus   . Anemia     b12 def, iron def, follow at cancer center, gets B12 and  another injection there. Could not remeber the name.  Dr. Ralene Ok is her  cancer doctor  . Thyroid disease     hypothyroidism h/o hyperthyroidism s/p ablation/ectomy  . Asthma   . Dysrhythmia     atrial fibb  . Shortness of breath   . Blood transfusion     two or more yrs ago  . Hypothyroidism     thyroid removed 4 or more yrs ago  . Atrial fibrillation   . Chronic renal insufficiency      ALLERGIES:  has No Known Allergies.  MEDICATIONS: has a current medication list which includes the following prescription(s): aspirin, calcitriol, glipizide, glucose blood, januvia, lancets 30g, levothyroxine, lisinopril, metoprolol tartrate, pantoprazole, and pravastatin.  SURGICAL HISTORY:  Past Surgical History  Procedure Laterality Date  . Eye surgery      cat ext ou  . Appendectomy      teenager  . Amputation  04/30/2011    Procedure: AMPUTATION FOOT;  Surgeon: Newt Minion, MD;  Location: [facility];  Service: Orthopedics;  Laterality: Right;  Right Midfoot Amputation    REVIEW OF SYSTEMS:   Constitutional: Denies fevers, chills or abnormal weight loss Eyes: Denies blurriness of vision Ears, nose, mouth, throat, and face: Denies mucositis or sore throat Respiratory: Denies cough, dyspnea or wheezes Cardiovascular: Denies palpitation, chest discomfort or lower extremity swelling Gastrointestinal:  Denies nausea, heartburn or change in bowel habits Skin: Denies abnormal skin rashes Lymphatics: Denies new lymphadenopathy or easy bruising Neurological:Denies numbness, tingling or new weaknesses Behavioral/Psych: Mood is  stable, no new changes  All other systems were reviewed with the patient and are negative.  PHYSICAL EXAMINATION: ECOG PERFORMANCE STATUS: 2  Blood pressure 142/65, pulse 98, temperature 98.2 F (36.8 C), temperature source Oral, resp. rate 18, height _0  (1.676 m), weight 184 lb (83.462 kg), SpO2 98 %.  GENERAL:alert, no distress and comfortable; chronically ill elderly female who uses a walker.  SKIN: skin color,  texture, turgor are normal, no rashes or significant lesions EYES: normal, Conjunctiva are pink and non-injected, sclera clear OROPHARYNX:no exudate, no erythema and lips, buccal mucosa, and tongue normal  NECK: supple, thyroid normal size, non-tender, without nodularity LYMPH:  no palpable lymphadenopathy in the cervical, axillary or supraclavicular LUNGS: clear to auscultation and percussion with normal breathing effort HEART: irregular rate & rhythm and no murmurs and 1+ lower extremity edema ABDOMEN:abdomen soft, non-tender and normal bowel sounds Musculoskeletal:no cyanosis of digits and no clubbing; s/p bilateral toes amputations as noted above.  NEURO: alert & oriented x 3 with fluent speech, ambulates via a walker  LABORATORY DATA: CBC Latest Ref Rng 07/19/2014 06/22/2014 05/18/2014  WBC 3.9 - 10.3 10e3/uL 7.2 7.6 7.0  Hemoglobin 11.6 - 15.9 g/dL 10.4(L) 10.7(L) 10.3(L)  Hematocrit 34.8 - 46.6 % 33.8(L) 35.1 33.9(L)  Platelets 145 - 400 10e3/uL 119(L) 147 147    CMP Latest Ref Rng 07/19/2014 06/22/2014 05/18/2014  Glucose 70 - 140 mg/dl 80 88 74  BUN 7.0 - 26.0 mg/dL 40.9(H) 36.8(H) 38.7(H)  Creatinine 0.6 - 1.1 mg/dL 2.4(H) 2.5(H) 2.2(H)  Sodium 136 - 145 mEq/L 142 144 145  Potassium 3.5 - 5.1 mEq/L 4.8 5.0 4.5  Chloride 96 - 112 mEq/L - - -  CO2 22 - 29 mEq/L 21(L) 22 25  Calcium 8.4 - 10.4 mg/dL 9.0 8.8 8.6  Total Protein 6.4 - 8.3 g/dL 7.1 7.5 7.3  Total Bilirubin 0.20 - 1.20 mg/dL 0.54 0.50 0.64  Alkaline Phos 40 - 150 U/L 84 88 93  AST 5 - 34 U/L _1 ALT 0 - 55 U/L <_2 RADIOGRAPHIC STUDIES: 1. Digital screening mammogram on 07/26/2010 was negative.  2. MRI of the right forefoot without IV contrast on 08/20/2010 showed  apparent new soft tissue ulceration distal to the third metatarsal status post prior resection of the third toe. No soft tissue abscess was demonstrated. There was interval resolution of the previously demonstrated marrow edema within the third  metatarsal. No evidence for osteomyelitis. There was stable mid foot degenerative changes and chronic myopathic changes.  3. CT of the cervical spine without IV contrast on 10/21/2010 showed  degenerative changes in the cervical spine. No displaced fractures  were identified.  4. CT of the head without IV contrast on 10/21/2010 showed no evidence  for acute intracranial hemorrhage, mass lesion or acute infarct. There was some mild diffuse atrophy. There was a subcutaneous hematoma over the left anterior frontal/periorbital region. No underlying skull fracture or acute intracranial abnormality.  5. Chest x-ray, 2 view, from 04/28/2011 showed stable moderate  cardiomegaly with no active lung disease. There was probable mild  scarring at the lung bases.   ASSESSMENT: [name] 80 y.o. female with a history of multiple comorbidities, including anemia, IgA MGUS, renal failure, B12 deficiency, etc.  1. Anemia and MGUS, rule out MM  --her anemia has been getting worse, despite Arenasp injection every 1-2 months. -Given her history of IgA MGUS, renal failure, and worsening anemia, multiple myeloma needs to  be ruled out. Her previous M protein level was low, IgA and light chain levels are elevated. -Her Skeletal survey was normal.  -I have discussed bone marrow biopsy several times, and she repeatedly declined due to the concern of complications from procedure such as pain. I'll hold on for now. -Her hemoglobin is 10.4 today, I would resume her Aranesp today.   2. Hypertension, diabetes, A. Fib, CKD, and other medical problems -She will continue follow-up with her primary care physician. She is not on Anticoagulation for A. Fib   All questions were answered. The patient knows to call the clinic with any problems, questions or concerns. We can certainly see the patient much sooner if necessary.  Plan -resume Aranesp today and every 6 weeks if Hb<11 RTC in 3 month with lab CBC, CMP and  SPEP/IFE  I spent 15 minutes counseling the patient face to face. The total time spent in the appointment was 20 minutes.    Truitt Merle, MD 07/19/2014   10:31 AM

## 2014-07-20 DIAGNOSIS — E119 Type 2 diabetes mellitus without complications: Secondary | ICD-10-CM | POA: Diagnosis not present

## 2014-07-20 NOTE — Telephone Encounter (Signed)
I ordered the walker.  I discussed with [name] about the diabetic shoes.  She will investigate if she has had them before.  Regardless, she will need an updated foot exam to order.  She has an appointment on 08/02/14 and we will assess then.   [name] - - can you help me remember to do the foot exam on [name] when she comes in on 08/02/14.   Thanks!  EBM

## 2014-07-21 DIAGNOSIS — E119 Type 2 diabetes mellitus without complications: Secondary | ICD-10-CM | POA: Diagnosis not present

## 2014-07-22 DIAGNOSIS — E119 Type 2 diabetes mellitus without complications: Secondary | ICD-10-CM | POA: Diagnosis not present

## 2014-07-23 DIAGNOSIS — E119 Type 2 diabetes mellitus without complications: Secondary | ICD-10-CM | POA: Diagnosis not present

## 2014-07-24 DIAGNOSIS — E119 Type 2 diabetes mellitus without complications: Secondary | ICD-10-CM | POA: Diagnosis not present

## 2014-07-25 DIAGNOSIS — E119 Type 2 diabetes mellitus without complications: Secondary | ICD-10-CM | POA: Diagnosis not present

## 2014-07-26 DIAGNOSIS — E119 Type 2 diabetes mellitus without complications: Secondary | ICD-10-CM | POA: Diagnosis not present

## 2014-07-27 DIAGNOSIS — E119 Type 2 diabetes mellitus without complications: Secondary | ICD-10-CM | POA: Diagnosis not present

## 2014-07-28 DIAGNOSIS — E119 Type 2 diabetes mellitus without complications: Secondary | ICD-10-CM | POA: Diagnosis not present

## 2014-07-29 DIAGNOSIS — E119 Type 2 diabetes mellitus without complications: Secondary | ICD-10-CM | POA: Diagnosis not present

## 2014-07-30 DIAGNOSIS — E119 Type 2 diabetes mellitus without complications: Secondary | ICD-10-CM | POA: Diagnosis not present

## 2014-07-31 DIAGNOSIS — E119 Type 2 diabetes mellitus without complications: Secondary | ICD-10-CM | POA: Diagnosis not present

## 2014-08-01 DIAGNOSIS — E119 Type 2 diabetes mellitus without complications: Secondary | ICD-10-CM | POA: Diagnosis not present

## 2014-08-02 ENCOUNTER — Encounter: Payer: Self-pay | Admitting: Internal Medicine

## 2014-08-02 ENCOUNTER — Ambulatory Visit (INDEPENDENT_AMBULATORY_CARE_PROVIDER_SITE_OTHER): Payer: Medicare Other | Admitting: Internal Medicine

## 2014-08-02 VITALS — BP 156/77 | HR 100 | Temp 98.3°F | Wt 187.6 lb

## 2014-08-02 DIAGNOSIS — E1140 Type 2 diabetes mellitus with diabetic neuropathy, unspecified: Secondary | ICD-10-CM

## 2014-08-02 DIAGNOSIS — K644 Residual hemorrhoidal skin tags: Secondary | ICD-10-CM

## 2014-08-02 DIAGNOSIS — I482 Chronic atrial fibrillation, unspecified: Secondary | ICD-10-CM

## 2014-08-02 DIAGNOSIS — E559 Vitamin D deficiency, unspecified: Secondary | ICD-10-CM | POA: Diagnosis not present

## 2014-08-02 DIAGNOSIS — M79642 Pain in left hand: Secondary | ICD-10-CM | POA: Diagnosis not present

## 2014-08-02 DIAGNOSIS — I4891 Unspecified atrial fibrillation: Secondary | ICD-10-CM

## 2014-08-02 DIAGNOSIS — M79641 Pain in right hand: Secondary | ICD-10-CM

## 2014-08-02 DIAGNOSIS — N184 Chronic kidney disease, stage 4 (severe): Secondary | ICD-10-CM

## 2014-08-02 DIAGNOSIS — M25542 Pain in joints of left hand: Secondary | ICD-10-CM

## 2014-08-02 DIAGNOSIS — M25541 Pain in joints of right hand: Secondary | ICD-10-CM

## 2014-08-02 DIAGNOSIS — Z0000 Encounter for general adult medical examination without abnormal findings: Secondary | ICD-10-CM

## 2014-08-02 DIAGNOSIS — Z7982 Long term (current) use of aspirin: Secondary | ICD-10-CM | POA: Diagnosis not present

## 2014-08-02 DIAGNOSIS — E1122 Type 2 diabetes mellitus with diabetic chronic kidney disease: Secondary | ICD-10-CM

## 2014-08-02 DIAGNOSIS — E119 Type 2 diabetes mellitus without complications: Secondary | ICD-10-CM | POA: Diagnosis not present

## 2014-08-02 DIAGNOSIS — E785 Hyperlipidemia, unspecified: Secondary | ICD-10-CM

## 2014-08-02 DIAGNOSIS — I129 Hypertensive chronic kidney disease with stage 1 through stage 4 chronic kidney disease, or unspecified chronic kidney disease: Secondary | ICD-10-CM

## 2014-08-02 DIAGNOSIS — D631 Anemia in chronic kidney disease: Secondary | ICD-10-CM | POA: Diagnosis not present

## 2014-08-02 DIAGNOSIS — I10 Essential (primary) hypertension: Secondary | ICD-10-CM

## 2014-08-02 DIAGNOSIS — D472 Monoclonal gammopathy: Secondary | ICD-10-CM

## 2014-08-02 DIAGNOSIS — E039 Hypothyroidism, unspecified: Secondary | ICD-10-CM

## 2014-08-02 DIAGNOSIS — E038 Other specified hypothyroidism: Secondary | ICD-10-CM

## 2014-08-02 DIAGNOSIS — N183 Chronic kidney disease, stage 3 (moderate): Secondary | ICD-10-CM

## 2014-08-02 LAB — GLUCOSE, CAPILLARY: Glucose-Capillary: 102 mg/dL — ABNORMAL HIGH (ref 70–99)

## 2014-08-02 LAB — POCT GLYCOSYLATED HEMOGLOBIN (HGB A1C): Hemoglobin A1C: 6.6

## 2014-08-02 LAB — RHEUMATOID FACTOR

## 2014-08-02 NOTE — Progress Notes (Signed)
Subjective:    Patient ID: [name], female    DOB: 1935/03/21, 80 y.o.   MRN: [id number]  CC: 3 month follow up for DM and HTN  HPI  [name] is a 80yo woman with PMH of DM with neuropathy and amputations, CKD stage 4, Atrial fibrillation, anemia of chronic disease and MGUS, HTN, HLD, hypothyroidism, vitamin D deficiency who presents for follow up of her DM and HTN.    I reviewed her last Oncology notes and that team is concerned that [name] may be transitioning to MM.  She has refused bone marrow biopsy at this time and aranesp shots will be continued.   [name] reports that she has pain in her heels and she is due for new diabetic shoes.    Sees Nephrologist, cannot remember the name, but she notes that she is due for follow up.    Needs paperwork to get different knobs on the doors of her house due to arthritis in hands.  She does have significant findings of OA in her hands including some pain without redness or swelling, stiffness particularly with grip, bony enlargement at PIPs, decreased ROM and tenderness over joints.  She has never had xrays of her hands.    Long time since last pap smear, she does not remember ever having an abnormal test.  Dr. Enriqueta Shutter did the last one, over 15 years ago.  No problems with bleeding, postmenopausal bleeding, etc.    She did not bring in her medications, but we discussed them.    Review of Systems  Constitutional: Negative for fever, chills and unexpected weight change.  Respiratory: Positive for cough (catch in throat). Negative for choking and chest tightness.   Cardiovascular: Negative for chest pain and leg swelling.  Gastrointestinal: Negative for nausea, vomiting and abdominal pain.  Genitourinary: Negative for dysuria, enuresis and difficulty urinating.  Musculoskeletal: Positive for arthralgias (joints of hands) and gait problem (uses walker). Negative for back pain and joint swelling.       Trouble with gripping and turning  wrist.  Trouble with opening bottles up.    Skin: Negative for rash and wound.  Neurological: Negative for dizziness and light-headedness.       Objective:   Physical Exam  Constitutional: She is oriented to person, place, and time. She appears well-developed and well-nourished. No distress.  Somewhat sleepy on exam, chronic  HENT:  Head: Normocephalic and atraumatic.  Eyes: Conjunctivae are normal. No scleral icterus.  Cardiovascular: Normal rate, regular rhythm and normal heart sounds.   No murmur heard. Pulmonary/Chest: Effort normal and breath sounds normal. No respiratory distress. She has no wheezes.  Abdominal: Soft. Bowel sounds are normal. There is no tenderness.  Musculoskeletal: She exhibits no edema or tenderness.  + decreased ROM of small joints of hands, tenderness over PIP and DIP joints (mild), bony enlargement of PIP joints bilaterally.   Foot exam completed, she is without toes on both feet.  Large developing corn over lateral/anterior right foot.  Monofilament exam abnormal with decreased sensation in some areas.  Pulses were difficult to palpate in DP and TP.  Her feet were warm and well perfused otherwise.     Neurological: She is alert and oriented to person, place, and time.  Skin: Skin is warm and dry. No rash noted. No erythema.  Psychiatric: She has a normal mood and affect. Her behavior is normal.   ESR, RF vitamin D level today  Stool cards  Assessment & Plan:  RTC in 3-4 months

## 2014-08-02 NOTE — Patient Instructions (Signed)
General Instructions: Please schedule a follow up visit within the next 4 months.   For your medications:   Please bring all of your pill  Bottles with you to each visit.  This will help make sure that we have an up to date list of all the medications you are taking.  Please also bring any over the counter herbal medications you are taking (not including advil, tylenol, etc.)  Please continue taking your medications as prescribed  Thank you!  Please return the stool cards to check for blood in your stool.  We will work on getting your diabetic shoes ordered.  You also have xrays of your hands which have been ordered, please stop by radiology this week to have that done.    Treatment Goals:  Goals (1 Years of Data) as of 08/02/14    None      Progress Toward Treatment Goals:  Treatment Goal 08/02/2014  Hemoglobin A1C at goal  Blood pressure at goal    Self Care Goals & Plans:  Self Care Goal 08/02/2014  Manage my medications take my medicines as prescribed; bring my medications to every visit; refill my medications on time  Monitor my health keep track of my blood glucose; bring my glucose meter and log to each visit; keep track of my blood pressure; check my feet daily  Eat healthy foods eat more vegetables; eat foods that are low in salt; eat baked foods instead of fried foods  Be physically active find an activity I enjoy    Home Blood Glucose Monitoring 10/25/2012  Check my blood sugar (No Data)  When to check my blood sugar before breakfast     Care Management & Community Referrals:  Referral 10/25/2012  Referrals made for care management support none needed

## 2014-08-03 DIAGNOSIS — E119 Type 2 diabetes mellitus without complications: Secondary | ICD-10-CM | POA: Diagnosis not present

## 2014-08-03 LAB — SEDIMENTATION RATE: Sed Rate: 12 mm/hr (ref 0–30)

## 2014-08-03 LAB — VITAMIN D 25 HYDROXY (VIT D DEFICIENCY, FRACTURES): VIT D 25 HYDROXY: 9 ng/mL — AB (ref 30–100)

## 2014-08-04 DIAGNOSIS — M25542 Pain in joints of left hand: Secondary | ICD-10-CM

## 2014-08-04 DIAGNOSIS — E119 Type 2 diabetes mellitus without complications: Secondary | ICD-10-CM | POA: Diagnosis not present

## 2014-08-04 DIAGNOSIS — M25541 Pain in joints of right hand: Secondary | ICD-10-CM | POA: Insufficient documentation

## 2014-08-04 MED ORDER — VITAMIN D3 250 MCG (10000 UT) PO CAPS: 10000.0000 [IU] | ORAL_CAPSULE | ORAL | Status: DC

## 2014-08-04 NOTE — Assessment & Plan Note (Signed)
I think she probably has osteoarthritis given her exam findings, symptoms.  Her ESR and RF are normal.  Will get hand xrays.  Once those are completed, she has requested paperwork be filled out to replace the knobs on her doors with something easier to manipulate.  Will order these changes.

## 2014-08-04 NOTE — Assessment & Plan Note (Signed)
Stool cards today.

## 2014-08-04 NOTE — Assessment & Plan Note (Signed)
Per review of notes, there is concern given her worsening anemia that she may be converting to MM.  The Hematology/Onc team is following her closely.  She has refused BM biopsy at this point.

## 2014-08-04 NOTE — Assessment & Plan Note (Signed)
She is getting aranesp injections from the Hematologist, it appears these will continue based on last notes.

## 2014-08-04 NOTE — Assessment & Plan Note (Signed)
Continues to be low on recheck today.  9ng/mL   From up to date: We suggest that, among those with stage 3 to 5 CKD not yet on dialysis and with elevated plasma intact PTH, treatment with ergocalciferol or cholecalciferol be initiated if nutritional vitamin D deficiency exists [13]. Deficiency is demonstrated by a 25(OH)D (calcidiol) level of <30 ng/mL; an average recommended dose is 8000 to 10,000 int. unit per week.  Will call patient and start her on supplementation.

## 2014-08-04 NOTE — Assessment & Plan Note (Signed)
Last LDL was at goal, continue pravastatin.

## 2014-08-04 NOTE — Assessment & Plan Note (Signed)
BMP Latest Ref Rng 07/19/2014 06/22/2014 05/18/2014  Glucose 70 - 140 mg/dl 80 88 74  BUN 7.0 - 26.0 mg/dL 40.9(H) 36.8(H) 38.7(H)  Creatinine 0.6 - 1.1 mg/dL 2.4(H) 2.5(H) 2.2(H)  Sodium 136 - 145 mEq/L 142 144 145  Potassium 3.5 - 5.1 mEq/L 4.8 5.0 4.5  Chloride 96 - 112 mEq/L - - -  CO2 22 - 29 mEq/L 21(L) 22 25  Calcium 8.4 - 10.4 mg/dL 9.0 8.8 8.6    Last BMET from 3/2 showed stable function.  Check at next visit.  She will continue on her BP medication and her DM is well controlled.

## 2014-08-04 NOTE — Assessment & Plan Note (Signed)
Last TSH was 0.472 over the summer.  Continue synthroid.  No signs/symptoms of thyroid disorder today.

## 2014-08-04 NOTE — Assessment & Plan Note (Signed)
Lab Results  Component Value Date   HGBA1C 6.6 08/02/2014   HGBA1C 7.2 12/07/2013   HGBA1C 6.8 10/25/2012     Assessment: Diabetes control: good control (HgbA1C at goal) Progress toward A1C goal:  at goal Comments: Decreased sensation to monofilament, she is s/p amputations of all toes both feet  Plan: Medications:  continue current medications, glipizide, januvia Home glucose monitoring: Frequency:   Timing:   Instruction/counseling given: reminded to get eye exam, reminded to bring blood glucose meter & log to each visit and discussed the need for weight loss Educational resources provided:   Self management tools provided:   Other plans:  LDL 105 at last check BP 142/65, likely close to goal given age Foot exam today showed neuropathy, consistent with known issue.  Will order new DM shoes.  She further has a callous on the right foot laterally, she may benefit from podiatry.  Will discuss at next visit Renal - monitoring closely, I advised her to make an appointment with her nephrologist.

## 2014-08-04 NOTE — Assessment & Plan Note (Signed)
BP Readings from Last 3 Encounters:  08/02/14 156/77  07/19/14 142/65  06/22/14 125/69    Lab Results  Component Value Date   NA 142 07/19/2014   K 4.8 07/19/2014   CREATININE 2.4* 07/19/2014    Assessment: Blood pressure control: controlled Progress toward BP goal:  at goal Comments: Recehck was within goal at 142/65  Plan: Medications:  continue current medications, lisinopril and metoprolol Educational resources provided:   Self management tools provided:   Other plans: check bmet at next visit

## 2014-08-04 NOTE — Assessment & Plan Note (Signed)
She is on aspirin and metoprolol.  She had a normal rate < 100 today.

## 2014-08-05 DIAGNOSIS — E119 Type 2 diabetes mellitus without complications: Secondary | ICD-10-CM | POA: Diagnosis not present

## 2014-08-06 DIAGNOSIS — E119 Type 2 diabetes mellitus without complications: Secondary | ICD-10-CM | POA: Diagnosis not present

## 2014-08-07 DIAGNOSIS — E119 Type 2 diabetes mellitus without complications: Secondary | ICD-10-CM | POA: Diagnosis not present

## 2014-08-08 ENCOUNTER — Telehealth: Payer: Self-pay | Admitting: Hematology

## 2014-08-08 DIAGNOSIS — E119 Type 2 diabetes mellitus without complications: Secondary | ICD-10-CM | POA: Diagnosis not present

## 2014-08-08 NOTE — Telephone Encounter (Signed)
Confirm appointment changed for 05/25. Mailed calendar.

## 2014-08-09 DIAGNOSIS — E119 Type 2 diabetes mellitus without complications: Secondary | ICD-10-CM | POA: Diagnosis not present

## 2014-08-10 DIAGNOSIS — E119 Type 2 diabetes mellitus without complications: Secondary | ICD-10-CM | POA: Diagnosis not present

## 2014-08-11 DIAGNOSIS — E119 Type 2 diabetes mellitus without complications: Secondary | ICD-10-CM | POA: Diagnosis not present

## 2014-08-12 DIAGNOSIS — E119 Type 2 diabetes mellitus without complications: Secondary | ICD-10-CM | POA: Diagnosis not present

## 2014-08-13 DIAGNOSIS — E119 Type 2 diabetes mellitus without complications: Secondary | ICD-10-CM | POA: Diagnosis not present

## 2014-08-14 DIAGNOSIS — E119 Type 2 diabetes mellitus without complications: Secondary | ICD-10-CM | POA: Diagnosis not present

## 2014-08-15 DIAGNOSIS — E119 Type 2 diabetes mellitus without complications: Secondary | ICD-10-CM | POA: Diagnosis not present

## 2014-08-16 DIAGNOSIS — E119 Type 2 diabetes mellitus without complications: Secondary | ICD-10-CM | POA: Diagnosis not present

## 2014-08-17 DIAGNOSIS — E119 Type 2 diabetes mellitus without complications: Secondary | ICD-10-CM | POA: Diagnosis not present

## 2014-08-18 DIAGNOSIS — E119 Type 2 diabetes mellitus without complications: Secondary | ICD-10-CM | POA: Diagnosis not present

## 2014-08-18 DIAGNOSIS — Z89439 Acquired absence of unspecified foot: Secondary | ICD-10-CM | POA: Diagnosis not present

## 2014-08-18 DIAGNOSIS — M869 Osteomyelitis, unspecified: Secondary | ICD-10-CM | POA: Diagnosis not present

## 2014-08-18 DIAGNOSIS — I872 Venous insufficiency (chronic) (peripheral): Secondary | ICD-10-CM | POA: Diagnosis not present

## 2014-08-18 DIAGNOSIS — M129 Arthropathy, unspecified: Secondary | ICD-10-CM | POA: Diagnosis not present

## 2014-08-19 DIAGNOSIS — E119 Type 2 diabetes mellitus without complications: Secondary | ICD-10-CM | POA: Diagnosis not present

## 2014-08-20 DIAGNOSIS — E119 Type 2 diabetes mellitus without complications: Secondary | ICD-10-CM | POA: Diagnosis not present

## 2014-08-28 ENCOUNTER — Telehealth: Payer: Self-pay | Admitting: Hematology

## 2014-08-28 NOTE — Telephone Encounter (Signed)
Patient called and canceled due to transportation not being able to pick her up. Patient will call back once she has that taken care of.

## 2014-08-30 ENCOUNTER — Other Ambulatory Visit: Payer: Medicare Other

## 2014-08-30 ENCOUNTER — Ambulatory Visit: Payer: Medicare Other

## 2014-09-30 ENCOUNTER — Encounter (HOSPITAL_COMMUNITY): Payer: Self-pay

## 2014-09-30 ENCOUNTER — Inpatient Hospital Stay (HOSPITAL_COMMUNITY)
Admission: EM | Admit: 2014-09-30 | Discharge: 2014-10-03 | DRG: 065 | Disposition: A | Discharge status: Skilled Nursing Facility | Payer: Medicare Other | Attending: Internal Medicine | Admitting: Internal Medicine

## 2014-09-30 ENCOUNTER — Emergency Department (HOSPITAL_COMMUNITY): Payer: Medicare Other

## 2014-09-30 ENCOUNTER — Inpatient Hospital Stay (HOSPITAL_COMMUNITY): Payer: Medicare Other

## 2014-09-30 DIAGNOSIS — R531 Weakness: Secondary | ICD-10-CM | POA: Diagnosis not present

## 2014-09-30 DIAGNOSIS — E785 Hyperlipidemia, unspecified: Secondary | ICD-10-CM | POA: Diagnosis present

## 2014-09-30 DIAGNOSIS — I6529 Occlusion and stenosis of unspecified carotid artery: Secondary | ICD-10-CM | POA: Diagnosis present

## 2014-09-30 DIAGNOSIS — I129 Hypertensive chronic kidney disease with stage 1 through stage 4 chronic kidney disease, or unspecified chronic kidney disease: Secondary | ICD-10-CM | POA: Diagnosis present

## 2014-09-30 DIAGNOSIS — I10 Essential (primary) hypertension: Secondary | ICD-10-CM | POA: Diagnosis not present

## 2014-09-30 DIAGNOSIS — E039 Hypothyroidism, unspecified: Secondary | ICD-10-CM | POA: Diagnosis present

## 2014-09-30 DIAGNOSIS — I63411 Cerebral infarction due to embolism of right middle cerebral artery: Principal | ICD-10-CM | POA: Diagnosis present

## 2014-09-30 DIAGNOSIS — D638 Anemia in other chronic diseases classified elsewhere: Secondary | ICD-10-CM | POA: Diagnosis present

## 2014-09-30 DIAGNOSIS — Z89431 Acquired absence of right foot: Secondary | ICD-10-CM

## 2014-09-30 DIAGNOSIS — R4781 Slurred speech: Secondary | ICD-10-CM | POA: Diagnosis present

## 2014-09-30 DIAGNOSIS — D509 Iron deficiency anemia, unspecified: Secondary | ICD-10-CM | POA: Diagnosis present

## 2014-09-30 DIAGNOSIS — Z6830 Body mass index (BMI) 30.0-30.9, adult: Secondary | ICD-10-CM

## 2014-09-30 DIAGNOSIS — G459 Transient cerebral ischemic attack, unspecified: Secondary | ICD-10-CM | POA: Diagnosis not present

## 2014-09-30 DIAGNOSIS — I639 Cerebral infarction, unspecified: Secondary | ICD-10-CM

## 2014-09-30 DIAGNOSIS — E119 Type 2 diabetes mellitus without complications: Secondary | ICD-10-CM | POA: Diagnosis not present

## 2014-09-30 DIAGNOSIS — E11649 Type 2 diabetes mellitus with hypoglycemia without coma: Secondary | ICD-10-CM | POA: Diagnosis present

## 2014-09-30 DIAGNOSIS — E669 Obesity, unspecified: Secondary | ICD-10-CM | POA: Diagnosis present

## 2014-09-30 DIAGNOSIS — R4789 Other speech disturbances: Secondary | ICD-10-CM | POA: Diagnosis not present

## 2014-09-30 DIAGNOSIS — Z7982 Long term (current) use of aspirin: Secondary | ICD-10-CM | POA: Diagnosis not present

## 2014-09-30 DIAGNOSIS — N184 Chronic kidney disease, stage 4 (severe): Secondary | ICD-10-CM | POA: Diagnosis present

## 2014-09-30 DIAGNOSIS — Z9841 Cataract extraction status, right eye: Secondary | ICD-10-CM

## 2014-09-30 DIAGNOSIS — Z823 Family history of stroke: Secondary | ICD-10-CM

## 2014-09-30 DIAGNOSIS — Z9842 Cataract extraction status, left eye: Secondary | ICD-10-CM | POA: Diagnosis not present

## 2014-09-30 DIAGNOSIS — I4891 Unspecified atrial fibrillation: Secondary | ICD-10-CM | POA: Diagnosis present

## 2014-09-30 LAB — CBC
HEMATOCRIT: 32.8 % — AB (ref 36.0–46.0)
Hemoglobin: 10.6 g/dL — ABNORMAL LOW (ref 12.0–15.0)
MCH: 27.9 pg (ref 26.0–34.0)
MCHC: 32.3 g/dL (ref 30.0–36.0)
MCV: 86.3 fL (ref 78.0–100.0)
Platelets: 129 10*3/uL — ABNORMAL LOW (ref 150–400)
RBC: 3.8 MIL/uL — ABNORMAL LOW (ref 3.87–5.11)
RDW: 15.5 % (ref 11.5–15.5)
WBC: 7.9 10*3/uL (ref 4.0–10.5)

## 2014-09-30 LAB — RAPID URINE DRUG SCREEN, HOSP PERFORMED
Amphetamines: NOT DETECTED
Barbiturates: NOT DETECTED
Benzodiazepines: NOT DETECTED
COCAINE: NOT DETECTED
Opiates: NOT DETECTED
Tetrahydrocannabinol: NOT DETECTED

## 2014-09-30 LAB — ETHANOL

## 2014-09-30 LAB — COMPREHENSIVE METABOLIC PANEL
ALBUMIN: 3.8 g/dL (ref 3.5–5.0)
ALT: 11 U/L — ABNORMAL LOW (ref 14–54)
AST: 18 U/L (ref 15–41)
Alkaline Phosphatase: 77 U/L (ref 38–126)
Anion gap: 11 (ref 5–15)
BUN: 45 mg/dL — ABNORMAL HIGH (ref 6–20)
CO2: 19 mmol/L — ABNORMAL LOW (ref 22–32)
Calcium: 9 mg/dL (ref 8.9–10.3)
Chloride: 108 mmol/L (ref 101–111)
Creatinine, Ser: 2.39 mg/dL — ABNORMAL HIGH (ref 0.44–1.00)
GFR calc Af Amer: 21 mL/min — ABNORMAL LOW (ref >60)
GFR calc non Af Amer: 18 mL/min — ABNORMAL LOW (ref >60)
Glucose, Bld: 89 mg/dL (ref 65–99)
POTASSIUM: 4.9 mmol/L (ref 3.5–5.1)
Sodium: 138 mmol/L (ref 135–145)
Total Bilirubin: 0.6 mg/dL (ref 0.3–1.2)
Total Protein: 7.5 g/dL (ref 6.5–8.1)

## 2014-09-30 LAB — GLUCOSE, CAPILLARY
Glucose-Capillary: 42 mg/dL — CL (ref 65–99)
Glucose-Capillary: 60 mg/dL — ABNORMAL LOW (ref 65–99)
Glucose-Capillary: 70 mg/dL (ref 65–99)

## 2014-09-30 LAB — APTT: aPTT: 26 seconds (ref 24–37)

## 2014-09-30 LAB — PROTIME-INR
INR: 1.23 (ref 0.00–1.49)
PROTHROMBIN TIME: 15.7 s — AB (ref 11.6–15.2)

## 2014-09-30 MED ORDER — ASPIRIN 325 MG PO TABS
325.0000 mg | ORAL_TABLET | Freq: Every day | ORAL | Status: DC
  Administered 2014-10-01: 325 mg via ORAL
  Filled 2014-09-30: qty 1

## 2014-09-30 MED ORDER — METOPROLOL TARTRATE 25 MG PO TABS
25.0000 mg | ORAL_TABLET | Freq: Two times a day (BID) | ORAL | Status: DC
  Administered 2014-09-30 – 2014-10-03 (×6): 25 mg via ORAL
  Filled 2014-09-30 (×6): qty 1

## 2014-09-30 MED ORDER — SODIUM CHLORIDE 0.9 % IV SOLN: INTRAVENOUS | Status: DC

## 2014-09-30 MED ORDER — PANTOPRAZOLE SODIUM 40 MG PO TBEC
40.0000 mg | DELAYED_RELEASE_TABLET | Freq: Every day | ORAL | Status: DC
  Administered 2014-10-01 – 2014-10-03 (×3): 40 mg via ORAL
  Filled 2014-09-30 (×3): qty 1

## 2014-09-30 MED ORDER — GLUCAGON HCL RDNA (DIAGNOSTIC) 1 MG IJ SOLR
INTRAMUSCULAR | Status: AC
  Filled 2014-09-30: qty 1

## 2014-09-30 MED ORDER — LEVOTHYROXINE SODIUM 112 MCG PO TABS
112.0000 ug | ORAL_TABLET | Freq: Every day | ORAL | Status: DC
  Administered 2014-10-01 – 2014-10-03 (×3): 112 ug via ORAL
  Filled 2014-09-30 (×3): qty 1

## 2014-09-30 MED ORDER — INSULIN ASPART 100 UNIT/ML ~~LOC~~ SOLN
0.0000 [IU] | Freq: Three times a day (TID) | SUBCUTANEOUS | Status: DC
Start: 2014-10-01 — End: 2014-10-03
  Administered 2014-10-01: 2 [IU] via SUBCUTANEOUS
  Administered 2014-10-01: 1 [IU] via SUBCUTANEOUS
  Administered 2014-10-02 – 2014-10-03 (×3): 2 [IU] via SUBCUTANEOUS
  Administered 2014-10-03: 1 [IU] via SUBCUTANEOUS

## 2014-09-30 MED ORDER — INSULIN ASPART 100 UNIT/ML ~~LOC~~ SOLN: 0.0000 [IU] | Freq: Every day | SUBCUTANEOUS | Status: DC

## 2014-09-30 MED ORDER — HEPARIN SODIUM (PORCINE) 5000 UNIT/ML IJ SOLN
5000.0000 [IU] | Freq: Three times a day (TID) | INTRAMUSCULAR | Status: DC
  Administered 2014-09-30 – 2014-10-01 (×2): 5000 [IU] via SUBCUTANEOUS
  Filled 2014-09-30 (×2): qty 1

## 2014-09-30 MED ORDER — DEXTROSE-NACL 5-0.45 % IV SOLN
INTRAVENOUS | Status: DC
  Administered 2014-09-30: 1000 mL via INTRAVENOUS

## 2014-09-30 MED ORDER — PRAVASTATIN SODIUM 20 MG PO TABS
20.0000 mg | ORAL_TABLET | Freq: Every day | ORAL | Status: DC
  Administered 2014-10-01: 20 mg via ORAL
  Filled 2014-09-30: qty 1

## 2014-09-30 MED ORDER — GLUCOSE 40 % PO GEL
ORAL | Status: AC
  Administered 2014-09-30: 37.5 g
  Filled 2014-09-30: qty 1

## 2014-09-30 MED ORDER — STROKE: EARLY STAGES OF RECOVERY BOOK: Freq: Once | Status: DC

## 2014-09-30 NOTE — ED Provider Notes (Signed)
CSN: [id number]     Arrival date & time 09/30/14  1245 History   First MD Initiated Contact with Patient 09/30/14 1247     Chief Complaint  Patient presents with  . Stroke Symptoms     (Consider location/radiation/quality/duration/timing/severity/associated sxs/prior Treatment) Patient is a 80 y.o. female presenting with neurologic complaint. The history is provided by the patient.  Neurologic Problem This is a new problem. The current episode started yesterday. The problem occurs constantly. The problem has been resolved. Pertinent negatives include no chest pain, no abdominal pain, no headaches and no shortness of breath. Nothing aggravates the symptoms. Nothing relieves the symptoms. She has tried nothing for the symptoms. The treatment provided no relief.    Past Medical History  Diagnosis Date  . Hypertension   . Hyperlipidemia   . Chronic venous insufficiency   . Osteomyelitis     s/p Rt 2nd toe and left 5 toes  amputation in 1/12  by Dr. Sharol Given  . Diabetes mellitus   . Anemia     b12 def, iron def, follow at cancer center, gets B12 and  another injection there. Could not remeber the name. Dr. Ralene Ok is her  cancer doctor  . Thyroid disease     hypothyroidism h/o hyperthyroidism s/p ablation/ectomy  . Asthma   . Dysrhythmia     atrial fibb  . Shortness of breath   . Blood transfusion     two or more yrs ago  . Hypothyroidism     thyroid removed 4 or more yrs ago  . Atrial fibrillation   . Chronic renal insufficiency    Past Surgical History  Procedure Laterality Date  . Eye surgery      cat ext ou  . Appendectomy      teenager  . Amputation  04/30/2011    Procedure: AMPUTATION FOOT;  Surgeon: Newt Minion, MD;  Location: [facility];  Service: Orthopedics;  Laterality: Right;  Right Midfoot Amputation   Family History  Problem Relation Age of Onset  . Anemia Mother   . HIV Brother   . Cancer Brother   . Colon cancer Mother     rectal   History  Substance Use  Topics  . Smoking status: Never Smoker   . Smokeless tobacco: Never Used  . Alcohol Use: No   OB History    No data available     Review of Systems  Constitutional: Negative for fever and fatigue.  HENT: Negative for congestion and drooling.   Eyes: Negative for pain.  Respiratory: Negative for cough and shortness of breath.   Cardiovascular: Negative for chest pain.  Gastrointestinal: Negative for nausea, vomiting, abdominal pain and diarrhea.  Genitourinary: Negative for dysuria and hematuria.  Musculoskeletal: Negative for back pain, gait problem and neck pain.  Skin: Negative for color change.  Neurological: Positive for speech difficulty. Negative for dizziness and headaches.  Hematological: Negative for adenopathy.  Psychiatric/Behavioral: Negative for behavioral problems.  All other systems reviewed and are negative.     Allergies  Review of patient's allergies indicates no known allergies.  Home Medications   Prior to Admission medications   Medication Sig Start Date End Date Taking? Authorizing Provider  aspirin 325 MG EC tablet Take 325 mg by mouth daily.    Historical Provider, MD  calcitRIOL (ROCALTROL) 0.25 MCG capsule Take 1 capsule by mouth every Monday, Wednesday, and Friday. 04/10/14   Historical Provider, MD  Cholecalciferol (VITAMIN D3) 10000 UNITS capsule Take 1 capsule (10,000  Units total) by mouth once a week. 08/04/14   [name], MD  glipiZIDE (GLUCOTROL) 10 MG tablet Take 1 tablet (10 mg total) by mouth 2 (two) times daily before a meal. 12/28/13   Bartholomew Crews, MD  glucose blood test strip Use as instructed 05/03/14   [name], MD  JANUVIA 25 MG tablet TAKE 1 TABLET BY MOUTH EVERY DAY 03/28/14   [name], MD  Lancets 30G MISC Use to check blood sugars as directed 10/03/10   Coralee Pesa, MD  levothyroxine (SYNTHROID, LEVOTHROID) 112 MCG tablet Take 1 tablet (112 mcg total) by mouth daily before breakfast. 03/28/14   [name], MD  lisinopril (PRINIVIL,ZESTRIL) 40 MG tablet TAKE ONE TABLET BY MOUTH ONE TIME DAILY 12/28/13   Bartholomew Crews, MD  metoprolol (LOPRESSOR) 25 MG tablet Take 1 tablet (25 mg total) by mouth 2 (two) times daily. 05/03/14   [name], MD  pantoprazole (PROTONIX) 40 MG tablet Take 1 tablet (40 mg total) by mouth daily. 02/02/14   [name], MD  pravastatin (PRAVACHOL) 20 MG tablet Take 1 tablet (20 mg total) by mouth daily. 05/03/14   [name], MD   There were no vitals taken for this visit. Physical Exam  Constitutional: She is oriented to person, place, and time. She appears well-developed and well-nourished.  HENT:  Head: Normocephalic and atraumatic.  Mouth/Throat: Oropharynx is clear and moist. No oropharyngeal exudate.  Eyes: Conjunctivae and EOM are normal. Pupils are equal, round, and reactive to light.  Neck: Normal range of motion. Neck supple.  Cardiovascular: Regular rhythm, normal heart sounds and intact distal pulses.  Exam reveals no gallop and no friction rub.   No murmur heard. Atrial fibrillation  Pulmonary/Chest: Effort normal and breath sounds normal. No respiratory distress. She has no wheezes.  Abdominal: Soft. Bowel sounds are normal. There is no tenderness. There is no rebound and no guarding.  Musculoskeletal: Normal range of motion. She exhibits no edema or tenderness.  Neurological: She is alert and oriented to person, place, and time.  alert, oriented x3 speech: normal in context, slight stuttering memory: intact grossly cranial nerves II-XII: asymmetric smile w/ slight droop on the left, otherwise grossly intact motor strength: full proximally and distally sensation: intact to light touch diffusely  cerebellar: finger-to-nose and heel-to-shin intact    Skin: Skin is warm and dry.  Psychiatric: She has a normal mood and affect. Her behavior is normal.  Nursing note and vitals reviewed.   ED Course  Procedures (including critical  care time) Labs Review Labs Reviewed  CBC - Abnormal; Notable for the following:    RBC 3.80 (*)    Hemoglobin 10.6 (*)    HCT 32.8 (*)    Platelets 129 (*)    All other components within normal limits  COMPREHENSIVE METABOLIC PANEL - Abnormal; Notable for the following:    CO2 19 (*)    BUN 45 (*)    Creatinine, Ser 2.39 (*)    ALT 11 (*)    GFR calc non Af Amer 18 (*)    GFR calc Af Amer 21 (*)    All other components within normal limits  PROTIME-INR - Abnormal; Notable for the following:    Prothrombin Time 15.7 (*)    All other components within normal limits  URINE RAPID DRUG SCREEN (HOSP PERFORMED)  ETHANOL  APTT  HEMOGLOBIN A1C  URINALYSIS, ROUTINE W REFLEX MICROSCOPIC  TROPONIN I  Randolm Idol, ED    Imaging Review Ct Head Wo Contrast  09/30/2014   CLINICAL DATA:  Slurred speech last night. Left-sided facial droop and left-sided weakness today.  EXAM: CT HEAD WITHOUT CONTRAST  TECHNIQUE: Contiguous axial images were obtained from the base of the skull through the vertex without intravenous contrast.  COMPARISON:  10/21/2010  FINDINGS: Ventricles, cisterns and other CSF spaces are within normal. There is no mass, mass effect, shift of midline structures or acute hemorrhage. There is evidence of mild chronic ischemic microvascular disease. No definite acute infarction. Possible old lacunar infarcts over the thalamus bilaterally, right lentiform nucleus and left insular region. Remaining bony structures are within normal. There is deviation of the nasal septum to the left.  IMPRESSION: No acute intracranial findings.  Chronic ischemic microvascular disease in a few small old bilateral central lacunar infarcts.   Electronically Signed   By: Marin Olp M.D.   On: 09/30/2014 14:26     EKG Interpretation   Date/Time:  Saturday Sep 30 2014 12:51:13 EDT Ventricular Rate:  109 PR Interval:    QRS Duration: 106 QT Interval:  351 QTC Calculation: 473 R Axis:   -26 Text  Interpretation:  Atrial fibrillation Borderline left axis deviation  Borderline T wave abnormalities No significant change since last tracing  Confirmed by Adhya Cocco  MD, Audley Hinojos ([id number]) on 09/30/2014 12:59:45 PM      MDM   Final diagnoses:  Transient cerebral ischemia, unspecified transient cerebral ischemia type    1:26 PM 80 y.o. female w hx of HTN, HLP, DM, afib not on anticoag who presents with expressive aphasia which began last night around 8-9 PM while talking on the phone. She also noted some drooling last night which seemed to be worse out of the corner of her left mouth. Her family notes her to the stuttering some today but otherwise not having any slurring of speech. She does have some difficulty with grinning on the left side of her face. She states she is otherwise been well. Vital signs unremarkable here. We'll get screening labs and imaging. NIHSS of 1.   Internal medicine to admit. Consulted neuro at their request.   Pamella Pert, MD 09/30/14 1601

## 2014-09-30 NOTE — H&P (Signed)
Date: 09/30/2014               Patient Name:  [name] MRN: [id number]  DOB: 03/06/1935 Age / Sex: 80 y.o., female   PCP: [name], MD         Medical Service: Internal Medicine Teaching Service         Attending Physician: Dr. Rayne Du att. providers found    First Contact: Dr. Lottie Mussel Pager: [phone]  Second Contact: Dr. Natasha Bence Pager: [phone]       After Hours (After 5p/  First Contact Pager: [phone]  weekends / holidays): Second Contact Pager: [phone]   Chief Complaint: slurred speech  History of Present Illness: [name] is a 80 year old woman with atrial fibrillation not on anti-coagulation, HTN, DM2, hypothyroidism, hyperlipidemia presents with slurred speech. She was on the phone with a neighbor yesterday when she said she was having trouble speaking. She also felt like she was drooling from the right side of her mouth.This morning she reports woke up with stuttering and continued drooling from the R mouth. This has gradually improved over the course of the day. Her family thinks that she is having difficulty expressing herself and her speech is off. [name] also reports that since yesterday she has felt weak in both of her arms/hands with some difficulty using her walker. She lives alone. She otherwise had no complaints.  Meds: No current facility-administered medications for this encounter.   Current Outpatient Prescriptions  Medication Sig Dispense Refill  . aspirin 325 MG EC tablet Take 325 mg by mouth daily.    . calcitRIOL (ROCALTROL) 0.25 MCG capsule Take 1 capsule by mouth every Monday, Wednesday, and Friday.  5  . glipiZIDE (GLUCOTROL) 10 MG tablet Take 1 tablet (10 mg total) by mouth 2 (two) times daily before a meal. 180 tablet 3  . glucose blood test strip Use as instructed 100 each 12  . JANUVIA 25 MG tablet TAKE 1 TABLET BY MOUTH EVERY DAY 90 tablet 3  . Lancets 30G MISC Use to check blood sugars as directed 100 each 3  . levothyroxine  (SYNTHROID, LEVOTHROID) 112 MCG tablet Take 1 tablet (112 mcg total) by mouth daily before breakfast. 30 tablet 6  . lisinopril (PRINIVIL,ZESTRIL) 40 MG tablet TAKE ONE TABLET BY MOUTH ONE TIME DAILY 90 tablet 3  . metoprolol (LOPRESSOR) 25 MG tablet Take 1 tablet (25 mg total) by mouth 2 (two) times daily. 60 tablet 6  . pantoprazole (PROTONIX) 40 MG tablet Take 1 tablet (40 mg total) by mouth daily. 90 tablet 2  . pravastatin (PRAVACHOL) 20 MG tablet Take 1 tablet (20 mg total) by mouth daily. 30 tablet 11  . Cholecalciferol (VITAMIN D3) 10000 UNITS capsule Take 1 capsule (10,000 Units total) by mouth once a week. (Patient not taking: Reported on 09/30/2014) 4 capsule 3    Allergies: Allergies as of 09/30/2014  . (No Known Allergies)   Past Medical History  Diagnosis Date  . Hypertension   . Hyperlipidemia   . Chronic venous insufficiency   . Osteomyelitis     s/p Rt 2nd toe and left 5 toes  amputation in 1/12  by Dr. Sharol Given  . Diabetes mellitus   . Anemia     b12 def, iron def, follow at cancer center, gets B12 and  another injection there. Could not remeber the name. Dr. Ralene Ok is her  cancer doctor  . Thyroid disease     hypothyroidism  h/o hyperthyroidism s/p ablation/ectomy  . Asthma   . Dysrhythmia     atrial fibb  . Shortness of breath   . Blood transfusion     two or more yrs ago  . Hypothyroidism     thyroid removed 4 or more yrs ago  . Atrial fibrillation   . Chronic renal insufficiency    Past Surgical History  Procedure Laterality Date  . Eye surgery      cat ext ou  . Appendectomy      teenager  . Amputation  04/30/2011    Procedure: AMPUTATION FOOT;  Surgeon: Newt Minion, MD;  Location: [facility];  Service: Orthopedics;  Laterality: Right;  Right Midfoot Amputation   Family History  Problem Relation Age of Onset  . Anemia Mother   . HIV Brother   . Cancer Brother   . Colon cancer Mother     rectal   History   Social History  . Marital Status: Widowed     Spouse Name: N/A  . Number of Children: N/A  . Years of Education: N/A   Occupational History  . Not on file.   Social History Main Topics  . Smoking status: Never Smoker   . Smokeless tobacco: Never Used  . Alcohol Use: No  . Drug Use: No  . Sexual Activity: Not on file   Other Topics Concern  . Not on file   Social History Narrative    Review of Systems: Review of systems negative except as noted above per HPI  Physical Exam: Blood pressure 162/87, pulse 99, resp. rate 12, SpO2 100 %.  Gen: No acute distress, well developed, well nourished HEENT: Atraumatic, PERRL, EOMI, sclerae anicteric, moist mucous membranes Heart: Regular rate and rhythm, normal S1 S2, no murmurs, rubs, or gallops Lungs: Clear to auscultation bilaterally, respirations unlabored Abd: Soft, non-tender, non-distended, + bowel sounds, no hepatosplenomegaly Neuro: A&O x 4, CN II-XII intact, finger-nose-finger coordination intact, strength 5/5 and symmetric in all extremities, sensation grossly intact  Lab results: Basic Metabolic Panel:  Recent Labs  09/30/14 1330  NA 138  K 4.9  CL 108  CO2 19*  GLUCOSE 89  BUN 45*  CREATININE 2.39*  CALCIUM 9.0   Liver Function Tests:  Recent Labs  09/30/14 1330  AST 18  ALT 11*  ALKPHOS 77  BILITOT 0.6  PROT 7.5  ALBUMIN 3.8   CBC:  Recent Labs  09/30/14 1330  WBC 7.9  HGB 10.6*  HCT 32.8*  MCV 86.3  PLT 129*   Coagulation:  Recent Labs  09/30/14 1330  LABPROT 15.7*  INR 1.23    Alcohol Level:  Recent Labs  09/30/14 1330  ETH <5     Imaging results:  Ct Head Wo Contrast  09/30/2014   CLINICAL DATA:  Slurred speech last night. Left-sided facial droop and left-sided weakness today.  EXAM: CT HEAD WITHOUT CONTRAST  TECHNIQUE: Contiguous axial images were obtained from the base of the skull through the vertex without intravenous contrast.  COMPARISON:  10/21/2010  FINDINGS: Ventricles, cisterns and other CSF spaces are  within normal. There is no mass, mass effect, shift of midline structures or acute hemorrhage. There is evidence of mild chronic ischemic microvascular disease. No definite acute infarction. Possible old lacunar infarcts over the thalamus bilaterally, right lentiform nucleus and left insular region. Remaining bony structures are within normal. There is deviation of the nasal septum to the left.  IMPRESSION: No acute intracranial findings.  Chronic ischemic microvascular  disease in a few small old bilateral central lacunar infarcts.   Electronically Signed   By: Marin Olp M.D.   On: 09/30/2014 14:26    Other results: EKG: atrial fibrillation, no ST or t-wave changes concerning for ischemia, compared to prior 10/25/12  Assessment & Plan by Problem: Active Problems:   * No active hospital problems. *   #TIA: Stacy Swigert appears to have had a TIA with some questionable facial droop and expressive aphasia. ED provider will contact neurology. She had no acute intracranial findings on head CT but it did show chronic ischemic microvascular disease with a few small old bilateral central lacunar infarcts. She has several risk factors for cerebrovascular disease including HTN, DM2, HL. She also has atrial fibrillation and is not on any anti-coagulation. Her CHADSVasc score is 7 assuming this was a TIA which is 11% risk of stroke per year. -appreciate neuro -MRI head brain wo contrast -2d echo w contrast -carotid dopplers -check lipid panel, UDS -hold antihypertensives for permissive hypertension -ASA 325 mg po daily -NPO until pass swallow screen -PT/OT/SLP -neuro checks q2h -cardiac monitoring  #Atrial fibrillation: Stacy Silberman is not on any anti-coagulation. She had been on pradaxa in the past but had stopped due to bleeding from foot ulcer. She did not want to continue anti-coagulation. Her PCP has discussed the risks and benefits of being on xarelto in the past and the patient preferred to be on high  dose ASA instead. She is also on lopressor 25 mg bid. Her CHADSVasc score is 7 assuming this was a TIA which is 11% risk of stroke per year. She and family were agreeable with beginning oral anti-coagulation. -eliquis per pharmacy -cont lopressor 25 mg bid to prevent RVR  #HTN: At home she is on lisinopril 40 mg daily, lopressor 25 mg bid. -hold lisinopril in setting of permissive HTN -cont lopressor to prevent atrial fibrillation with RVR  #DM2: Last hemoglobin A1c 08/02/14 was 6.6. At home she is on glipizide 10 mg bid, januvia 25 mg daily. -hold glipizide, januvia -SSI sensitive  #Hypothyroidism: At home she is on synthroid 112 mcg daily. -cont synthroid 112 mcg daily  #Hyperlipidemia: Last lipid panel 11/2013 with cholesterol 199, LDL 105, HDL 83, and triglyceride 57. At home she is on pravastatin 20 mg daily. -lipid panel -cont pravastatin 20 mg daily  #CKD stage 4: Creatinine today 2.39 which is at baseline for past few years. -cont to monitor  #Anemia of Chronic Disease: Hemoglobin today is 10.6 which is at her baseline 10-11, with MCV 86. Last anemia panel 04/2013 w iron 55, TIBC 208, ferritin 250 suggests this is likely related to her worsening renal function. -cont to monitor  #Diet: NPO  #DVT PPx: heparin 5000 u [place] tid  #Code: Full  Dispo: Disposition is deferred at this time, awaiting improvement of current medical problems. Anticipated discharge in approximately 1-2 day(s).   The patient does have a current PCP [name], MD) and does need an [facility] hospital follow-up appointment after discharge.  The patient does not know have transportation limitations that hinder transportation to clinic appointments.  Signed: Lottie Mussel, MD Internal Medicine, PGY-1 Pager [phone] 09/30/2014, 4:38 PM

## 2014-09-30 NOTE — ED Notes (Signed)
Per EMS: Pt reports slurred speech starting at 2100 last night, states "I thought it would go away." Pt found by aide with new slurred speech, left sided facial droop and left sided weakness. Pt is AO x4. GCS 15. BG 115.

## 2014-09-30 NOTE — Progress Notes (Signed)
Patient's Blood sugar was 43 for here HS CBG, started Hypoglycemic protocol, she did not respond to orange juice 8 oz's twice as her blood sugar only went up to 60, gave her 37.5 g of dextrose 40% and CBG came up to 70. Called attending and received order for D5 1/2 NS. Will continue to monitor.

## 2014-09-30 NOTE — Progress Notes (Signed)
Recd patient to room via stretcher from ED. Patietn and family educated to safety plan. VS taken and telemetry applied. Daughter educated to NPO status and rational;daughter very distressed as patient is diabetic.- Camera operator and neuro MD supported nurses rationale for continuing NPO status. Questions answered and reassurance offered to patient and her family.

## 2014-09-30 NOTE — Consult Note (Signed)
Referring Physician: Dr Lynnae January    Chief Complaint: Speech difficulties  HPI: [name] is a 80 y.o. female with a history of hypertension, hyperlipidemia, chronic renal insufficiency, diabetes mellitus, atrial fibrillation on anticoagulation in the past which was stopped by the patient secondary to bruising. Since that time she has been on aspirin 325 mg daily. Last evening the patient was speaking on the telephone sometime between 8 and 9 PM when she noted she was having difficulty with her speech. Her words seemed slurred. When she got off of the phone she attempted to ambulate with her walker and noted left sided weakness in the upper extremity more so than the lower extremity. There was no one with her at that time and she did not seek medical attention. She was brought to the emergency department today by her son and daughter for further evaluation. She has residual deficits although the family feels that they are somewhat improved.    Date last known well: Date: 09/29/2014 Time last known well: Unable to determine tPA Given: No: The patient was out of the window for therapeutic treatment.  Past Medical History  Diagnosis Date  . Hypertension   . Hyperlipidemia   . Chronic venous insufficiency   . Osteomyelitis     s/p Rt 2nd toe and left 5 toes  amputation in 1/12  by Dr. Sharol Given  . Diabetes mellitus   . Anemia     b12 def, iron def, follow at cancer center, gets B12 and  another injection there. Could not remeber the name. Dr. Ralene Ok is her  cancer doctor  . Thyroid disease     hypothyroidism h/o hyperthyroidism s/p ablation/ectomy  . Asthma   . Dysrhythmia     atrial fibb  . Shortness of breath   . Blood transfusion     two or more yrs ago  . Hypothyroidism     thyroid removed 4 or more yrs ago  . Atrial fibrillation   . Chronic renal insufficiency     Past Surgical History  Procedure Laterality Date  . Eye surgery      cat ext ou  . Appendectomy      teenager  .  Amputation  04/30/2011    Procedure: AMPUTATION FOOT;  Surgeon: Newt Minion, MD;  Location: [facility];  Service: Orthopedics;  Laterality: Right;  Right Midfoot Amputation    Family History  Problem Relation Age of Onset  . Anemia Mother   . HIV Brother   . Cancer Brother   . Colon cancer Mother     rectal   Her sister and brother have both had strokes . Social History:  reports that she has never smoked. She has never used smokeless tobacco. She reports that she does not drink alcohol or use illicit drugs.  The patient lives alone.  Allergies: No Known Allergies  Medications:  No current facility-administered medications for this encounter.   Current Outpatient Prescriptions  Medication Sig Dispense Refill  . aspirin 325 MG EC tablet Take 325 mg by mouth daily.    . calcitRIOL (ROCALTROL) 0.25 MCG capsule Take 1 capsule by mouth every Monday, Wednesday, and Friday.  5  . glipiZIDE (GLUCOTROL) 10 MG tablet Take 1 tablet (10 mg total) by mouth 2 (two) times daily before a meal. 180 tablet 3  . glucose blood test strip Use as instructed 100 each 12  . JANUVIA 25 MG tablet TAKE 1 TABLET BY MOUTH EVERY DAY 90 tablet 3  .  Lancets 30G MISC Use to check blood sugars as directed 100 each 3  . levothyroxine (SYNTHROID, LEVOTHROID) 112 MCG tablet Take 1 tablet (112 mcg total) by mouth daily before breakfast. 30 tablet 6  . lisinopril (PRINIVIL,ZESTRIL) 40 MG tablet TAKE ONE TABLET BY MOUTH ONE TIME DAILY 90 tablet 3  . metoprolol (LOPRESSOR) 25 MG tablet Take 1 tablet (25 mg total) by mouth 2 (two) times daily. 60 tablet 6  . pantoprazole (PROTONIX) 40 MG tablet Take 1 tablet (40 mg total) by mouth daily. 90 tablet 2  . pravastatin (PRAVACHOL) 20 MG tablet Take 1 tablet (20 mg total) by mouth daily. 30 tablet 11  . Cholecalciferol (VITAMIN D3) 10000 UNITS capsule Take 1 capsule (10,000 Units total) by mouth once a week. (Patient not taking: Reported on 09/30/2014) 4 capsule 3   medication  reviewed/display:[id number]}  ROS: History obtained from the patient and family  General ROS: negative for - chills, fatigue, fever, night sweats, weight gain or weight loss Psychological ROS: negative for - behavioral disorder, hallucinations, memory difficulties, mood swings or suicidal ideation Ophthalmic ROS: negative for - blurry vision, double vision, eye pain or loss of vision ENT ROS: negative for - epistaxis, nasal discharge, oral lesions, sore throat, tinnitus or vertigo Allergy and Immunology ROS: negative for - hives or itchy/watery eyes Hematological and Lymphatic ROS: negative for - bleeding problems, bruising or swollen lymph nodes Endocrine ROS: negative for - galactorrhea, hair pattern changes, polydipsia/polyuria or temperature intolerance Respiratory ROS: negative for - cough, hemoptysis, shortness of breath or wheezing Cardiovascular ROS: negative for - chest pain, dyspnea on exertion, edema or irregular heartbeat Gastrointestinal ROS: negative for - abdominal pain, diarrhea, hematemesis, nausea/vomiting or stool incontinence Genito-Urinary ROS: negative for - dysuria, hematuria, incontinence or urinary frequency/urgency Musculoskeletal ROS: negative for - joint swelling or muscular weakness. She ambulates with a walker. The family reports no falls Neurological ROS: as noted in HPI Positive for difficulty swallowing and choking since yesterday. Dermatological ROS: negative for rash and skin lesion changes   Physical Examination: Blood pressure 162/87, pulse 99, resp. rate 12, SpO2 100 %.   General - somewhat lethargic 80 year old female in no acute distress. Heart - irregularly irregular rhythm with a possible soft murmur. Lungs - Clear to auscultation anteriorly. Abdomen - Soft - non tender Extremities - weak to absent. No significant edema. Bilateral metatarsal amputations. Skin - Warm and dry    Mental Status: (Difficult exam secondary to lethargy) Lethargic but  responds to stimulation. Oriented, thought content appropriate.  Speech fluent but mildly dysarthric.  Able to follow 3 step commands with prompting. Cranial Nerves: II: Discs not visualized; Visual fields could not be tested due to patient's inability to cooperate.  pupils equal, small and sluggish. III,IV, VI: ptosis not present, extra-ocular motions intact bilaterally V,VII: smile with mild left facial droop. Sensation intact bilaterally. VIII: hearing normal bilaterally IX,X: gag reflex present XI: bilateral shoulder shrug XII: midline tongue extension Motor: Right : Upper extremity   5/5    Left:     Upper extremity   3-4/5 with drift  Lower extremity   5/5     Lower extremity   5/5 Tone and bulk:normal tone throughout; no atrophy noted Sensory: Pinprick and light touch intact throughout, bilaterally Deep Tendon Reflexes: 1+ and symmetric throughout Plantars: Unable to test secondary to amputations. Cerebellar: normal finger-to-nose,  and normal heel-to-shin test. Extremely slow movements. Gait: Deferred   Laboratory Studies:  Basic Metabolic Panel:  Recent Labs Lab 09/30/14  1330  NA 138  K 4.9  CL 108  CO2 19*  GLUCOSE 89  BUN 45*  CREATININE 2.39*  CALCIUM 9.0    Liver Function Tests:  Recent Labs Lab 09/30/14 1330  AST 18  ALT 11*  ALKPHOS 77  BILITOT 0.6  PROT 7.5  ALBUMIN 3.8   No results for input(s): LIPASE, AMYLASE in the last 168 hours. No results for input(s): AMMONIA in the last 168 hours.  CBC:  Recent Labs Lab 09/30/14 1330  WBC 7.9  HGB 10.6*  HCT 32.8*  MCV 86.3  PLT 129*    Cardiac Enzymes: No results for input(s): CKTOTAL, CKMB, CKMBINDEX, TROPONINI in the last 168 hours.  BNP: Invalid input(s): POCBNP  CBG: No results for input(s): GLUCAP in the last 168 hours.  Microbiology: Results for orders placed or performed during the hospital encounter of 04/28/11  Surgical pcr screen     Status: None   Collection Time:  04/28/11  2:18 PM  Result Value Ref Range Status   MRSA, PCR NEGATIVE NEGATIVE Final   Staphylococcus aureus NEGATIVE NEGATIVE Final    Comment:        The Xpert SA Assay (FDA approved for NASAL specimens only), is one component of a comprehensive surveillance program.  It is not intended to diagnose infection nor to guide or monitor treatment.    Coagulation Studies:  Recent Labs  09/30/14 1330  LABPROT 15.7*  INR 1.23    Urinalysis: No results for input(s): COLORURINE, LABSPEC, PHURINE, GLUCOSEU, HGBUR, BILIRUBINUR, KETONESUR, PROTEINUR, UROBILINOGEN, NITRITE, LEUKOCYTESUR in the last 168 hours.  Invalid input(s): APPERANCEUR  Lipid Panel:    Component Value Date/Time   CHOL 199 12/07/2013 1048   TRIG 57 12/07/2013 1048   HDL 83 12/07/2013 1048   CHOLHDL 2.4 12/07/2013 1048   VLDL 11 12/07/2013 1048   LDLCALC 105* 12/07/2013 1048    HgbA1C:  Lab Results  Component Value Date   HGBA1C 6.6 08/02/2014    Urine Drug Screen:     Component Value Date/Time   LABOPIA NONE DETECTED 09/30/2014 1450   COCAINSCRNUR NONE DETECTED 09/30/2014 1450   LABBENZ NONE DETECTED 09/30/2014 1450   AMPHETMU NONE DETECTED 09/30/2014 1450   THCU NONE DETECTED 09/30/2014 1450   LABBARB NONE DETECTED 09/30/2014 1450    Alcohol Level:  Recent Labs Lab 09/30/14 1330  ETH <5    Other results: EKG: Atrial fibrillation with rapid ventricular response at 109 bpm.  Imaging:  Ct Head Wo Contrast 09/30/2014    No acute intracranial findings.  Chronic ischemic microvascular disease in a few small old bilateral central lacunar infarcts.        Assessment: 80 y.o. female with multiple stroke risk factors including atrial fibrillation not on anticoagulation who presented with speech difficulties and left-sided weakness which occurred sometime between 8 and 9 PM last night.  Stroke Risk Factors - atrial fibrillation, diabetes mellitus, family history, hyperlipidemia and  hypertension  Plan: 1. HgbA1c, fasting lipid panel 2. MRI, MRA  of the brain without contrast 3. PT consult, OT consult, Speech consult 4. Echocardiogram 5. Carotid dopplers 6. Prophylactic therapy-Antiplatelet med: Aspirin - dose 325 mg - consider NOAC anticoagulation once stroke size is been determined. 7. NPO until RN stroke swallow screen 8. Telemetry monitoring 9. Frequent neuro checks   Lowry Ram [facility] Pager [phone] 09/30/2014, 5:38 PM   I have seen and evaluated the patient. I have reviewed the above note and made appropriate changes.  I agree that a stroke is most likely. TIA would not cause persistent symptoms. ASA for now, but will need to readdress anticoagulation(patient self discontinued due to mild bruising).   Roland Rack, MD [facility] [phone]  If 7pm- 7am, please page neurology on call as listed in [facility].

## 2014-09-30 NOTE — ED Notes (Signed)
Admitting at bedside 

## 2014-10-01 ENCOUNTER — Inpatient Hospital Stay (HOSPITAL_COMMUNITY): Payer: Medicare Other

## 2014-10-01 DIAGNOSIS — E785 Hyperlipidemia, unspecified: Secondary | ICD-10-CM

## 2014-10-01 DIAGNOSIS — I129 Hypertensive chronic kidney disease with stage 1 through stage 4 chronic kidney disease, or unspecified chronic kidney disease: Secondary | ICD-10-CM

## 2014-10-01 DIAGNOSIS — I639 Cerebral infarction, unspecified: Secondary | ICD-10-CM

## 2014-10-01 DIAGNOSIS — Z79899 Other long term (current) drug therapy: Secondary | ICD-10-CM

## 2014-10-01 DIAGNOSIS — E1122 Type 2 diabetes mellitus with diabetic chronic kidney disease: Secondary | ICD-10-CM

## 2014-10-01 DIAGNOSIS — I63411 Cerebral infarction due to embolism of right middle cerebral artery: Principal | ICD-10-CM

## 2014-10-01 DIAGNOSIS — E039 Hypothyroidism, unspecified: Secondary | ICD-10-CM

## 2014-10-01 DIAGNOSIS — I10 Essential (primary) hypertension: Secondary | ICD-10-CM

## 2014-10-01 DIAGNOSIS — I4891 Unspecified atrial fibrillation: Secondary | ICD-10-CM

## 2014-10-01 DIAGNOSIS — D631 Anemia in chronic kidney disease: Secondary | ICD-10-CM

## 2014-10-01 DIAGNOSIS — G459 Transient cerebral ischemic attack, unspecified: Secondary | ICD-10-CM

## 2014-10-01 DIAGNOSIS — Z7982 Long term (current) use of aspirin: Secondary | ICD-10-CM

## 2014-10-01 DIAGNOSIS — N184 Chronic kidney disease, stage 4 (severe): Secondary | ICD-10-CM

## 2014-10-01 LAB — LIPID PANEL
CHOLESTEROL: 180 mg/dL (ref 0–200)
HDL: 81 mg/dL (ref >40)
LDL CALC: 91 mg/dL (ref 0–99)
TRIGLYCERIDES: 41 mg/dL (ref <150)
Total CHOL/HDL Ratio: 2.2 RATIO
VLDL: 8 mg/dL (ref 0–40)

## 2014-10-01 LAB — BASIC METABOLIC PANEL
Anion gap: 10 (ref 5–15)
BUN: 40 mg/dL — ABNORMAL HIGH (ref 6–20)
CO2: 22 mmol/L (ref 22–32)
CREATININE: 2.23 mg/dL — AB (ref 0.44–1.00)
Calcium: 8.9 mg/dL (ref 8.9–10.3)
Chloride: 107 mmol/L (ref 101–111)
GFR calc Af Amer: 23 mL/min — ABNORMAL LOW (ref >60)
GFR calc non Af Amer: 20 mL/min — ABNORMAL LOW (ref >60)
Glucose, Bld: 106 mg/dL — ABNORMAL HIGH (ref 65–99)
POTASSIUM: 4.5 mmol/L (ref 3.5–5.1)
Sodium: 139 mmol/L (ref 135–145)

## 2014-10-01 LAB — CBC
HCT: 31.8 % — ABNORMAL LOW (ref 36.0–46.0)
HEMOGLOBIN: 10 g/dL — AB (ref 12.0–15.0)
MCH: 27.2 pg (ref 26.0–34.0)
MCHC: 31.4 g/dL (ref 30.0–36.0)
MCV: 86.4 fL (ref 78.0–100.0)
PLATELETS: 123 10*3/uL — AB (ref 150–400)
RBC: 3.68 MIL/uL — ABNORMAL LOW (ref 3.87–5.11)
RDW: 15.4 % (ref 11.5–15.5)
WBC: 7 10*3/uL (ref 4.0–10.5)

## 2014-10-01 LAB — GLUCOSE, CAPILLARY
GLUCOSE-CAPILLARY: 100 mg/dL — AB (ref 65–99)
GLUCOSE-CAPILLARY: 99 mg/dL (ref 65–99)
Glucose-Capillary: 119 mg/dL — ABNORMAL HIGH (ref 65–99)
Glucose-Capillary: 146 mg/dL — ABNORMAL HIGH (ref 65–99)
Glucose-Capillary: 152 mg/dL — ABNORMAL HIGH (ref 65–99)
Glucose-Capillary: 171 mg/dL — ABNORMAL HIGH (ref 65–99)

## 2014-10-01 LAB — TROPONIN I
TROPONIN I: 0.06 ng/mL — AB (ref <0.031)
TROPONIN I: 0.06 ng/mL — AB (ref <0.031)
Troponin I: 0.06 ng/mL — ABNORMAL HIGH (ref <0.031)

## 2014-10-01 MED ORDER — ASPIRIN EC 81 MG PO TBEC
81.0000 mg | DELAYED_RELEASE_TABLET | Freq: Every day | ORAL | Status: DC
  Administered 2014-10-02 – 2014-10-03 (×2): 81 mg via ORAL
  Filled 2014-10-01 (×2): qty 1

## 2014-10-01 MED ORDER — ATORVASTATIN CALCIUM 40 MG PO TABS
40.0000 mg | ORAL_TABLET | Freq: Every day | ORAL | Status: DC
  Administered 2014-10-01 – 2014-10-02 (×2): 40 mg via ORAL
  Filled 2014-10-01 (×2): qty 1

## 2014-10-01 MED ORDER — APIXABAN 5 MG PO TABS
5.0000 mg | ORAL_TABLET | Freq: Two times a day (BID) | ORAL | Status: DC
  Administered 2014-10-01 – 2014-10-03 (×4): 5 mg via ORAL
  Filled 2014-10-01 (×5): qty 1

## 2014-10-01 MED ORDER — SODIUM CHLORIDE 0.9 % IV BOLUS (SEPSIS)
500.0000 mL | Freq: Once | INTRAVENOUS | Status: AC
  Administered 2014-10-01: 500 mL via INTRAVENOUS

## 2014-10-01 NOTE — Progress Notes (Signed)
  Date: 10/01/2014  Patient name: [name]  Medical record number: [id number]  Date of birth: [birth year]   I have seen and evaluated Boris Sharper and discussed their care with the Residency Team. Ms Krenz is a 80 y/o female admitted for slurred speech, drooling, ext weakness. She was found to have an MCA stroke. She lives alone and uses a walker to walk. Has been in SNF before after her forefoot amputations and willing to return if needed this admit.   Filed Vitals:   10/01/14 0935  BP: 142/87  Pulse: 94  Temp: 98 F (36.7 C)  Resp: 20   Gen Lying in bed. NAD.  Hirr irr no MRG Lctab  ABD + BS, S/NT/ND Ext s/p B forefoot amputation. Callous bottom R foot with 2 mm opening. Neuro : no deficts  Assessment and Plan: I have seen and evaluated the patient as outlined above. I agree with the formulated Assessment and Plan as detailed in the residents' admission note, with the following changes:   1. Acute ischemic stroke presumed embolic in MCA distribution - pt has known A Fib but is not on anti-coag 2/2 complications in the past. She has been on ASA 325 mg though. Her MRI is 2/2 MCA distribution strokes. W/U is pending. She is agreeable to starting eliquis which can be dosed for her renal failure.   2. A Fib - she is rate controlled on her home meds. We plan to start eliquis once stable.   3. HTN - holding meds for permissive HTN.   Anticipate D/C 1-3 days once studies are complete and we arrange home vs SNF.  Bartholomew Crews, MD 5/15/20161:06 PM

## 2014-10-01 NOTE — Progress Notes (Addendum)
Patient seen by Dr. Leonie Man, feel it is appropriate to resume anticoagulation at this time in the setting of atrial fibrillation. Discussed previously w/ patient and family, feel that Eliquis is likely the best option at this time. Given age, weight, and Cr, will start patient on 5 mg bid, discussed w/ pharmacy.  Natasha Bence, MD PGY-2, Internal Medicine Pager: [phone]   ADDENDUM: Have discussed w/ neurology, agree with starting Eliquis at dose listed above. Will confirm this with the family.   Signed: Luanne Bras, MD 10/01/2014 2:46 PM

## 2014-10-01 NOTE — Progress Notes (Signed)
Subjective: Episode of hypoglycemia overnight to 43 which came up to 70 after orange juice and dextrose 40%. She was then put on D5 1/2 NS and this morning up to 99.   Ms Meuser says she still feels weak in her arms and somewhat in her legs. She says she still feels like she is having some difficulty speaking compared to prior. While interviewing and examining, her nephew called the room and after getting permission from Ms Hollern I gave him summary of her results and plan.  Objective: Vital signs in last 24 hours: Filed Vitals:   09/30/14 2129 10/01/14 0200 10/01/14 0602 10/01/14 0935  BP: 174/73 142/90 144/67 142/87  Pulse: 97 80 94 94  Temp: 98.2 F (36.8 C) 98.2 F (36.8 C) 98.8 F (37.1 C) 98 F (36.7 C)  TempSrc: Oral Oral Oral Oral  Resp: 16 18 18 20   SpO2: 99% 98% 98% 100%   Weight change:   Intake/Output Summary (Last 24 hours) at 10/01/14 1041 Last data filed at 09/30/14 2030  Gross per 24 hour  Intake      0 ml  Output      1 ml  Net     -1 ml   Gen: No acute distress, well developed, well nourished HEENT: Atraumatic, PERRL, EOMI, sclerae anicteric, moist mucous membranes Heart: irregularly irregular, normal S1 S2, no murmurs, rubs, or gallops Lungs: Clear to auscultation bilaterally, respirations unlabored Abd: Soft, non-tender, non-distended, + bowel sounds, no hepatosplenomegaly Extremities: b/l toe amputations well healed. Skin on feet very dry. L foot with bandage, R foot with small ~ 2 mm indentation on metatarsal base callus Neuro: A&O x 4, CN II-XII intact, finger-nose-finger coordination intact, strength 5/5 and symmetric in all extremities, sensation grossly intact   Lab Results: Basic Metabolic Panel:  Recent Labs Lab 09/30/14 1330 10/01/14 0144  NA 138 139  K 4.9 4.5  CL 108 107  CO2 19* 22  GLUCOSE 89 106*  BUN 45* 40*  CREATININE 2.39* 2.23*  CALCIUM 9.0 8.9   Liver Function Tests:  Recent Labs Lab 09/30/14 1330  AST 18  ALT 11*    ALKPHOS 77  BILITOT 0.6  PROT 7.5  ALBUMIN 3.8   CBC:  Recent Labs Lab 09/30/14 1330 10/01/14 0144  WBC 7.9 7.0  HGB 10.6* 10.0*  HCT 32.8* 31.8*  MCV 86.3 86.4  PLT 129* 123*   Cardiac Enzymes:  Recent Labs Lab 10/01/14 0144 10/01/14 0909  TROPONINI 0.06* 0.06*   CBG:  Recent Labs Lab 09/30/14 2128 09/30/14 2200 09/30/14 2214 09/30/14 2357 10/01/14 0626  GLUCAP 42* 60* 70 119* 99   Fasting Lipid Panel:  Recent Labs Lab 10/01/14 0144  CHOL 180  HDL 81  LDLCALC 91  TRIG 41  CHOLHDL 2.2   Coagulation:  Recent Labs Lab 09/30/14 1330  LABPROT 15.7*  INR 1.23   Urine Drug Screen: Drugs of Abuse     Component Value Date/Time   LABOPIA NONE DETECTED 09/30/2014 1450   COCAINSCRNUR NONE DETECTED 09/30/2014 1450   LABBENZ NONE DETECTED 09/30/2014 1450   AMPHETMU NONE DETECTED 09/30/2014 1450   THCU NONE DETECTED 09/30/2014 1450   LABBARB NONE DETECTED 09/30/2014 1450    Alcohol Level:  Recent Labs Lab 09/30/14 1330  ETH <5    Micro Results: No results found for this or any previous visit (from the past 240 hour(s)). Studies/Results: Ct Head Wo Contrast  09/30/2014   CLINICAL DATA:  Slurred speech last night. Left-sided facial  droop and left-sided weakness today.  EXAM: CT HEAD WITHOUT CONTRAST  TECHNIQUE: Contiguous axial images were obtained from the base of the skull through the vertex without intravenous contrast.  COMPARISON:  10/21/2010  FINDINGS: Ventricles, cisterns and other CSF spaces are within normal. There is no mass, mass effect, shift of midline structures or acute hemorrhage. There is evidence of mild chronic ischemic microvascular disease. No definite acute infarction. Possible old lacunar infarcts over the thalamus bilaterally, right lentiform nucleus and left insular region. Remaining bony structures are within normal. There is deviation of the nasal septum to the left.  IMPRESSION: No acute intracranial findings.  Chronic  ischemic microvascular disease in a few small old bilateral central lacunar infarcts.   Electronically Signed   By: Marin Olp M.D.   On: 09/30/2014 14:26   Mr Jodene Nam Head Wo Contrast  09/30/2014   CLINICAL DATA:  Speech difficulty beginning last night, associated with LEFT-sided weakness. History of hypertension, hyperlipidemia, chronic renal failure, diabetes, atrial fibrillation, not on anti coagulation.  EXAM: MRI HEAD WITHOUT CONTRAST  MRA HEAD WITHOUT CONTRAST  TECHNIQUE: Multiplanar, multiecho pulse sequences of the brain and surrounding structures were obtained without intravenous contrast. Angiographic images of the head were obtained using MRA technique without contrast.  COMPARISON:  CT of the head Sep 30, 2014 at 1408 hours  FINDINGS: MRI HEAD FINDINGS  Moderately motion degraded examination.  Patchy reduced diffusion within a 3 x 1.7 cm distribution with low ADC values in RIGHT frontal lobe. No susceptibility artifact to suggest hemorrhage.  The ventricles and sulci are normal for patient's age. Patchy supratentorial white matter T2 hyperintensities may be underestimated due to motion. No midline shift or mass effect. No definite mass lesions though motion degrades sensitivity for subtle parenchymal signal abnormality. Remote RIGHT basal ganglia, bilateral thalamus lacunar infarcts.  No abnormal extra-axial fluid collections. Status post bilateral ocular lens implants. Mild paranasal sinus mucosal thickening with atretic LEFT maxillary sinus most consistent with chronic sinusitis. The mastoid air cells appear well aerated.  No abnormal sellar expansion. No cerebellar tonsillar ectopia. No suspicious calvarial bone marrow signal. Patient appears edentulous.  MRA HEAD FINDINGS  Moderately motion degraded examination limits the sensitivity for luminal irregularity or low grade stenosis. Motion results in duplicated appearance of the vessels at the circle of Willis.  Flow related enhancement within the  cervical, petrous, cavernous and supra clinoid internal carotid arteries. Patent anterior communicating artery with flow related enhancement within the anterior and middle cerebral arteries including more distal segments.  Flow related enhancement within the bilateral vertebral arteries, basilar artery, which demonstrates duplicated appearance due to motion. Normal flow related enhancement of the posterior cerebral arteries, including distal segments.  No large vessel occlusion.  IMPRESSION: MRI HEAD: Moderately motion degraded examination. Patchy areas of acute ischemia RIGHT frontal lobe, MCA territory.  Involutional changes. At least mild white matter changes likely represent chronic small vessel ischemic disease. Remote bilateral thalamus and RIGHT basal ganglia lacunar infarct.  MRA HEAD: Moderately motion degraded examination without large vessel occlusion or definite high-grade stenosis though limited assessment at the circle of Willis.   Electronically Signed   By: Elon Alas   On: 09/30/2014 23:03   Mri Brain Without Contrast  09/30/2014   CLINICAL DATA:  Speech difficulty beginning last night, associated with LEFT-sided weakness. History of hypertension, hyperlipidemia, chronic renal failure, diabetes, atrial fibrillation, not on anti coagulation.  EXAM: MRI HEAD WITHOUT CONTRAST  MRA HEAD WITHOUT CONTRAST  TECHNIQUE: Multiplanar, multiecho pulse  sequences of the brain and surrounding structures were obtained without intravenous contrast. Angiographic images of the head were obtained using MRA technique without contrast.  COMPARISON:  CT of the head Sep 30, 2014 at 1408 hours  FINDINGS: MRI HEAD FINDINGS  Moderately motion degraded examination.  Patchy reduced diffusion within a 3 x 1.7 cm distribution with low ADC values in RIGHT frontal lobe. No susceptibility artifact to suggest hemorrhage.  The ventricles and sulci are normal for patient's age. Patchy supratentorial white matter T2  hyperintensities may be underestimated due to motion. No midline shift or mass effect. No definite mass lesions though motion degrades sensitivity for subtle parenchymal signal abnormality. Remote RIGHT basal ganglia, bilateral thalamus lacunar infarcts.  No abnormal extra-axial fluid collections. Status post bilateral ocular lens implants. Mild paranasal sinus mucosal thickening with atretic LEFT maxillary sinus most consistent with chronic sinusitis. The mastoid air cells appear well aerated.  No abnormal sellar expansion. No cerebellar tonsillar ectopia. No suspicious calvarial bone marrow signal. Patient appears edentulous.  MRA HEAD FINDINGS  Moderately motion degraded examination limits the sensitivity for luminal irregularity or low grade stenosis. Motion results in duplicated appearance of the vessels at the circle of Willis.  Flow related enhancement within the cervical, petrous, cavernous and supra clinoid internal carotid arteries. Patent anterior communicating artery with flow related enhancement within the anterior and middle cerebral arteries including more distal segments.  Flow related enhancement within the bilateral vertebral arteries, basilar artery, which demonstrates duplicated appearance due to motion. Normal flow related enhancement of the posterior cerebral arteries, including distal segments.  No large vessel occlusion.  IMPRESSION: MRI HEAD: Moderately motion degraded examination. Patchy areas of acute ischemia RIGHT frontal lobe, MCA territory.  Involutional changes. At least mild white matter changes likely represent chronic small vessel ischemic disease. Remote bilateral thalamus and RIGHT basal ganglia lacunar infarct.  MRA HEAD: Moderately motion degraded examination without large vessel occlusion or definite high-grade stenosis though limited assessment at the circle of Willis.   Electronically Signed   By: Elon Alas   On: 09/30/2014 23:03   Medications: I have reviewed the  patient's current medications. Scheduled Meds: .  stroke: mapping our early stages of recovery book   Does not apply Once  . aspirin  325 mg Oral Daily  . atorvastatin  40 mg Oral q1800  . heparin  5,000 Units Subcutaneous 3 times per day  . insulin aspart  0-5 Units Subcutaneous QHS  . insulin aspart  0-9 Units Subcutaneous TID WC  . levothyroxine  112 mcg Oral QAC breakfast  . metoprolol  25 mg Oral BID  . pantoprazole  40 mg Oral Daily   Continuous Infusions:  PRN Meds:. Assessment/Plan: Active Problems:   TIA (transient ischemic attack)  #CVA: Ms Tapia's neurologic symptoms are due to new CVA. Ms Tuel has patchy areas of acute ischemia in the R frontal lobe (MCA territory) on MRI as well as remote bilateral thalamus and R basal ganglia lacunar infarct. The cause is likely embolic as she has atrial fibrillation not on anti-coagulation despite CHADSVasc score of 7 and previous use of anti-coagulation. She is followed by neurology who recommend full stroke work-up. MRA without large vessel occlusion or definite high-grade stenosis. SLP recomemnd DYS 2 diet. Awaiting PT, OT, TTE, carotid doppler. She is on ASA 325 mg daily -appreciate neuro, SLP -2d echo w contrast -carotid dopplers -hold antihypertensives for permissive hypertension -ASA 325 mg po daily -start eliquis pending stroke work-up -DYS 2 diet -PT/OT -  cardiac monitoring  #Atrial fibrillation: Ms Icenhower is not on any anti-coagulation. She had been on pradaxa in the past but had stopped due to bleeding from foot ulcer. She did not want to continue anti-coagulation. Her PCP has discussed the risks and benefits of being on xarelto in the past and the patient preferred to be on high dose ASA instead. She is also on lopressor 25 mg bid. Her CHADSVasc score is 7 assuming this was a TIA which is 11% risk of stroke per year. She and family were agreeable with beginning oral anti-coagulation. -eliquis per pharmacy after neurology  clearance -cont lopressor 25 mg bid to prevent RVR  #HTN: At home she is on lisinopril 40 mg daily, lopressor 25 mg bid. -hold lisinopril in setting of permissive HTN -cont lopressor to prevent atrial fibrillation with RVR  #DM2: Last hemoglobin A1c 08/02/14 was 6.6. At home she is on glipizide 10 mg bid, januvia 25 mg daily which were held. She had episode of hypoglycemia overnight to 43 which came up to 70 after orange juice and dextrose 40%. She was then put on D5 1/2 NS and this morning up to 99. Likely as she was NPO when she failed bedside swallow screen until seen by SLP this morning -hold glipizide, januvia -SSI sensitive -resumed DYS 2 diet  #Hypothyroidism: At home she is on synthroid 112 mcg daily. -cont synthroid 112 mcg daily  #Hyperlipidemia: Current lipid panel with cholesterol 180, LDL 91, HDL 81, triglyceride 41. At home she is on pravastatin 20 mg daily. -lipid panel -d/c pravastatin 20 mg daily -start higher intensity atorvastatin 40 mg daily  #CKD stage 4: Creatinine today 2.23, down from 2.39 on presentation which is at baseline for past few years. -cont to monitor  #Anemia of Chronic Disease: Hemoglobin today is 10.0, relatively stable from admission 10.6 which is at her baseline 10-11, with MCV 86. Last anemia panel 04/2013 w iron 55, TIBC 208, ferritin 250 suggests this is likely related to her worsening renal function. -cont to monitor  #Diet: DYS 2  #DVT PPx: heparin 5000 u Peculiar tid  #Code: Full  Dispo: Disposition is deferred at this time, awaiting improvement of current medical problems.  Anticipated discharge in approximately 1 day(s).   The patient does have a current PCP [name], MD) and does need an [facility] hospital follow-up appointment after discharge.  The patient does not know have transportation limitations that hinder transportation to clinic appointments.  .Services Needed at time of discharge: Y = Yes, Blank = No PT:   OT:   RN:     Equipment:   Other:     LOS: 1 day   Kelby Aline, MD 10/01/2014, 10:41 AM

## 2014-10-01 NOTE — Progress Notes (Signed)
ANTICOAGULATION CONSULT NOTE - Initial Consult  Pharmacy Consult for Eliquis  Indication: atrial fibrillation  No Known Allergies  Patient Measurements:   Heparin Dosing Weight: n/a   Vital Signs: Temp: 98 F (36.7 C) (05/15 1337) Temp Source: Oral (05/15 1337) BP: 86/67 mmHg (05/15 1337) Pulse Rate: 82 (05/15 1337)  Labs:  Recent Labs  09/30/14 1330 10/01/14 0144 10/01/14 0909 10/01/14 1343  HGB 10.6* 10.0*  --   --   HCT 32.8* 31.8*  --   --   PLT 129* 123*  --   --   APTT 26  --   --   --   LABPROT 15.7*  --   --   --   INR 1.23  --   --   --   CREATININE 2.39* 2.23*  --   --   TROPONINI  --  0.06* 0.06* 0.06*    CrCl cannot be calculated (Unknown ideal weight.).   Medical History: Past Medical History  Diagnosis Date  . Hypertension   . Hyperlipidemia   . Chronic venous insufficiency   . Osteomyelitis     s/p Rt 2nd toe and left 5 toes  amputation in 1/12  by Dr. Sharol Given  . Diabetes mellitus   . Anemia     b12 def, iron def, follow at cancer center, gets B12 and  another injection there. Could not remeber the name. Dr. Ralene Ok is her  cancer doctor  . Thyroid disease     hypothyroidism h/o hyperthyroidism s/p ablation/ectomy  . Asthma   . Dysrhythmia     atrial fibb  . Shortness of breath   . Blood transfusion     two or more yrs ago  . Hypothyroidism     thyroid removed 4 or more yrs ago  . Atrial fibrillation   . Chronic renal insufficiency     Medications:  Prescriptions prior to admission  Medication Sig Dispense Refill Last Dose  . aspirin 325 MG EC tablet Take 325 mg by mouth daily.   09/29/2014 at Unknown time  . calcitRIOL (ROCALTROL) 0.25 MCG capsule Take 1 capsule by mouth every Monday, Wednesday, and Friday.  5 09/29/2014 at Unknown time  . glipiZIDE (GLUCOTROL) 10 MG tablet Take 1 tablet (10 mg total) by mouth 2 (two) times daily before a meal. 180 tablet 3 09/30/2014 at Unknown time  . glucose blood test strip Use as instructed 100  each 12 Taking  . JANUVIA 25 MG tablet TAKE 1 TABLET BY MOUTH EVERY DAY 90 tablet 3 09/29/2014 at Unknown time  . Lancets 30G MISC Use to check blood sugars as directed 100 each 3 Taking  . levothyroxine (SYNTHROID, LEVOTHROID) 112 MCG tablet Take 1 tablet (112 mcg total) by mouth daily before breakfast. 30 tablet 6 09/30/2014 at Unknown time  . lisinopril (PRINIVIL,ZESTRIL) 40 MG tablet TAKE ONE TABLET BY MOUTH ONE TIME DAILY 90 tablet 3 09/29/2014 at Unknown time  . metoprolol (LOPRESSOR) 25 MG tablet Take 1 tablet (25 mg total) by mouth 2 (two) times daily. 60 tablet 6 09/29/2014 at 1900  . pantoprazole (PROTONIX) 40 MG tablet Take 1 tablet (40 mg total) by mouth daily. 90 tablet 2 09/29/2014 at Unknown time  . pravastatin (PRAVACHOL) 20 MG tablet Take 1 tablet (20 mg total) by mouth daily. 30 tablet 11 09/29/2014 at Unknown time  . Cholecalciferol (VITAMIN D3) 10000 UNITS capsule Take 1 capsule (10,000 Units total) by mouth once a week. (Patient not taking: Reported on 09/30/2014) 4  capsule 3 Completed Course at Unknown time    Assessment: 79 YOF admitted for slurred speech, drooling and ext weakness worked up as a code stroke. She has a history Afib for which she is to be started on Eliquis. Age 69 years, Wt 85 kg, SCr 2.23. H/h low but stable, Plt low at 123.   Goal of Therapy:  Secondary stroke prevention  Monitor platelets by anticoagulation protocol: Yes   Plan:  -Eliquis 5 mg twice daily for Afib -Monitor CBC and s/s of bleeding -Provide Eliquis Education   Albertina Parr, PharmD., BCPS Clinical Pharmacist Pager [phone]

## 2014-10-01 NOTE — Progress Notes (Signed)
STROKE TEAM PROGRESS NOTE   HISTORY [name] is a 80 y.o. female with a history of hypertension, hyperlipidemia, chronic renal insufficiency, diabetes mellitus, atrial fibrillation on anticoagulation in the past which was stopped by the patient secondary to bruising. Since that time she has been on aspirin 325 mg daily. Last evening the patient was speaking on the telephone sometime between 8 and 9 PM when she noted she was having difficulty with her speech. Her words seemed slurred. When she got off of the phone she attempted to ambulate with her walker and noted left sided weakness in the upper extremity more so than the lower extremity. There was no one with her at that time and she did not seek medical attention. She was brought to the emergency department today by her son and daughter for further evaluation. They noted that the patient had been choking while trying to eat. She has residual deficits although the family feels that they are somewhat improved.    Date last known well: Date: 09/29/2014 Time last known well: Unable to determine tPA Given: No: The patient was out of the window for therapeutic treatment.   SUBJECTIVE (INTERVAL HISTORY) Family member at the bedside. The patient feels she is improving.   OBJECTIVE Temp:  [98.1 F (36.7 C)-98.8 F (37.1 C)] 98.8 F (37.1 C) (05/15 0602) Pulse Rate:  [80-106] 94 (05/15 0602) Cardiac Rhythm:  [-]  Resp:  [12-18] 18 (05/15 0602) BP: (142-185)/(67-99) 144/67 mmHg (05/15 0602) SpO2:  [97 %-100 %] 98 % (05/15 0602)   Recent Labs Lab 09/30/14 2128 09/30/14 2200 09/30/14 2214 09/30/14 2357 10/01/14 0626  GLUCAP 42* 60* 70 119* 99    Recent Labs Lab 09/30/14 1330 10/01/14 0144  NA 138 139  K 4.9 4.5  CL 108 107  CO2 19* 22  GLUCOSE 89 106*  BUN 45* 40*  CREATININE 2.39* 2.23*  CALCIUM 9.0 8.9    Recent Labs Lab 09/30/14 1330  AST 18  ALT 11*  ALKPHOS 77  BILITOT 0.6  PROT 7.5  ALBUMIN 3.8     Recent Labs Lab 09/30/14 1330 10/01/14 0144  WBC 7.9 7.0  HGB 10.6* 10.0*  HCT 32.8* 31.8*  MCV 86.3 86.4  PLT 129* 123*    Recent Labs Lab 10/01/14 0144  TROPONINI 0.06*    Recent Labs  09/30/14 1330  LABPROT 15.7*  INR 1.23   No results for input(s): COLORURINE, LABSPEC, PHURINE, GLUCOSEU, HGBUR, BILIRUBINUR, KETONESUR, PROTEINUR, UROBILINOGEN, NITRITE, LEUKOCYTESUR in the last 72 hours.  Invalid input(s): APPERANCEUR     Component Value Date/Time   CHOL 180 10/01/2014 0144   TRIG 41 10/01/2014 0144   HDL 81 10/01/2014 0144   CHOLHDL 2.2 10/01/2014 0144   VLDL 8 10/01/2014 0144   LDLCALC 91 10/01/2014 0144   Lab Results  Component Value Date   HGBA1C 6.6 08/02/2014      Component Value Date/Time   LABOPIA NONE DETECTED 09/30/2014 1450   COCAINSCRNUR NONE DETECTED 09/30/2014 1450   LABBENZ NONE DETECTED 09/30/2014 1450   AMPHETMU NONE DETECTED 09/30/2014 1450   THCU NONE DETECTED 09/30/2014 1450   LABBARB NONE DETECTED 09/30/2014 1450     Recent Labs Lab 09/30/14 1330  ETH <5    Ct Head Wo Contrast 09/30/2014    No acute intracranial findings.  Chronic ischemic microvascular disease in a few small old bilateral central lacunar infarcts.      Mr Jodene Nam Head Wo Contrast 09/30/2014     MRI HEAD:  Moderately motion degraded examination.  Patchy areas of acute ischemia RIGHT frontal lobe, MCA territory.  Involutional changes.  At least mild white matter changes likely represent chronic small vessel ischemic disease.  Remote bilateral thalamus and RIGHT basal ganglia lacunar infarct.    MRA HEAD:  Moderately motion degraded examination without large vessel occlusion or definite high-grade stenosis though limited assessment at the circle of Willis.    PHYSICAL EXAM Elderly female not in distress. . Afebrile. Head is nontraumatic. Neck is supple without bruit.    Cardiac exam no murmur or gallop. Lungs are clear to auscultation. Distal pulses are  well felt.  Neurological Exam : Awake alert oriented x 3 normal speech and language. Mild left lower face asymmetry. Tongue midline. No drift. Mild diminished fine finger movements on left. Orbits right over left upper extremity. Mild left grip weak.. Normal sensation . Normal coordination.  ASSESSMENT/PLAN [name] is a 81 y.o. female with history of hypertension, hyperlipidemia, chronic renal insufficiency, diabetes mellitus, atrial fibrillation anticoagulated in the past but stopped secondary to bruising, presenting with transient speech difficulties, left-sided weakness and choking while trying to eat.  She did not receive IV t-PA due to delayed presentation.  Small stroke right frontal lobe, MCA territory:  Non-dominant - probably embolic secondary to atrial fibrillation.  Resultant  left upper extremity weakness  MRI - patchy areas of acute ischemia right frontal lobe, MCA territory  MRA  no high-grade stenosis.  Carotid Doppler - pending   2D Echo - pending  LDL - 91  HgbA1c - 6.6 (on 08/02/2014)  Subcutaneous heparin for VTE prophylaxis  Diet NPO time specified  aspirin 325 mg orally every day prior to admission, now on aspirin 325 mg orally every day  Ongoing aggressive stroke risk factor management  Therapy recommendations:  Home health physical and occupational therapies recommended  Disposition:  Pending  Hypertension  Home meds: Lisinopril and metoprolol,  Blood pressure labile  Hyperlipidemia  Home meds:  Pravachol 20 mg daily resumed in hospital  LDL 91, goal < 70  Pravachol changed to Lipitor 40 mg daily  Continue statin at discharge  Diabetes  HgbA1c -6.6 (on 08/02/2014)  , goal < 7.0  Controlled  Other Stroke Risk Factors  Advanced age  Atrial fibrillation without anticoagulation  Obesity, There is no weight on file to calculate BMI.   Family hx stroke (brother and sister)  Other Active Problems  Chronic renal  insufficiency  Anemia  PLAN  Dr. Leonie Man has reviewed the MRI images and feels it would be appropriate to resume anticoagulation at this time.  Recommend wound care nurse for wounds on feet.   Hospital day # 1  Mikey Bussing PA-C [facility] Pager [phone] 10/01/2014, 1:47 PM I have personally examined this patient, reviewed notes, independently viewed imaging studies, participated in medical decision making and plan of care. I have made any additions or clarifications directly to the above note. Agree with note above. She presented with   speech  Difficulties and left sided weakness likely from small infarct from  atrial fibrillation disease and remains at risk for recurrent strokes/TIA and neurological worsening. Continue ongoing stroke evaluation and agrressive risk factor modification.Resume warfarin for anticoagulation and cannot use NOAC due to renal insufficiency.D/w son, patient and answered questions.  Antony Contras, MD Medical Director [facility] Pager: [phone] 10/01/2014 4:28 PM     To contact Stroke Continuity provider, please refer to [email or web address] After hours, contact General Neurology

## 2014-10-01 NOTE — Evaluation (Signed)
Clinical/Bedside Swallow Evaluation Patient Details  Name: [name] MRN: [id number] Date of Birth: [birth year]  Today's Date: 10/01/2014 Time: SLP Start Time (ACUTE ONLY): 4098 SLP Stop Time (ACUTE ONLY): 0841 SLP Time Calculation (min) (ACUTE ONLY): 17 min  Past Medical History:  Past Medical History  Diagnosis Date  . Hypertension   . Hyperlipidemia   . Chronic venous insufficiency   . Osteomyelitis     s/p Rt 2nd toe and left 5 toes  amputation in 1/12  by Dr. Sharol Given  . Diabetes mellitus   . Anemia     b12 def, iron def, follow at cancer center, gets B12 and  another injection there. Could not remeber the name. Dr. Ralene Ok is her  cancer doctor  . Thyroid disease     hypothyroidism h/o hyperthyroidism s/p ablation/ectomy  . Asthma   . Dysrhythmia     atrial fibb  . Shortness of breath   . Blood transfusion     two or more yrs ago  . Hypothyroidism     thyroid removed 4 or more yrs ago  . Atrial fibrillation   . Chronic renal insufficiency    Past Surgical History:  Past Surgical History  Procedure Laterality Date  . Eye surgery      cat ext ou  . Appendectomy      teenager  . Amputation  04/30/2011    Procedure: AMPUTATION FOOT;  Surgeon: Newt Minion, MD;  Location: [facility];  Service: Orthopedics;  Laterality: Right;  Right Midfoot Amputation   HPI:  [name] is a 80 year old woman with atrial fibrillation not on anti-coagulation, HTN, DM2, hypothyroidism, hyperlipidemia presents with slurred speech. MRI shows areas of acute ischemia right frontal lobe, MCA territory.   Assessment / Plan / Recommendation Clinical Impression  Pt has mild left-sided labial weakness which does not appear to functionally impact her oral phase of swallow. Mastication is effortful as pt's upper dentures are not available, however she utilizes liquid and puree washes with Min cues in order to facilitate this. Intermittent, dry throat clearing noted throughout testing which SLP suspects  could be related to pt-reported h/o reflux. Pt also says she has been doing a lot of throat clearing PTA as she feels like she is having post-nasal drip. Overall, will initiate Dys 2 diet and thin liquids, with advancement of solids likely when her dentures can be obtained. SLP to follow for tolerance given throat clearing as noted above.   Of note, pt also believes that her speech remains altered from her baseline. Given acute CVA, please consider ordering SLP cognitive-linguistic evaluation.    Aspiration Risk  Mild    Diet Recommendation Dysphagia 2 (Fine chop);Thin   Medication Administration: Whole meds with liquid Compensations: Slow rate;Small sips/bites;Follow solids with liquid    Other  Recommendations Oral Care Recommendations: Oral care BID   Follow Up Recommendations       Frequency and Duration    1 week   Pertinent Vitals/Pain n/a    SLP Swallow Goals     Swallow Study Prior Functional Status       General Date of Onset: 09/30/14 Other Pertinent Information: [name] is a 80 year old woman with atrial fibrillation not on anti-coagulation, HTN, DM2, hypothyroidism, hyperlipidemia presents with slurred speech. MRI shows areas of acute ischemia right frontal lobe, MCA territory. Type of Study: Bedside swallow evaluation Previous Swallow Assessment: none in chart Diet Prior to this Study: NPO Temperature Spikes Noted: No Respiratory  Status: Room air History of Recent Intubation: No Behavior/Cognition: Alert;Cooperative;Pleasant mood Oral Cavity - Dentition: Other (Comment) (dentures (bottom only- top not available)) Self-Feeding Abilities: Able to feed self Patient Positioning: Upright in bed Baseline Vocal Quality: Normal    Oral/Motor/Sensory Function Overall Oral Motor/Sensory Function: Impaired Labial ROM: Reduced left Labial Symmetry: Abnormal symmetry left Labial Strength: Reduced Lingual ROM: Within Functional Limits Lingual Symmetry: Within  Functional Limits Facial Symmetry: Within Functional Limits Mandible: Within Functional Limits   Ice Chips Ice chips: Not tested   Thin Liquid Thin Liquid: Impaired Presentation: Cup;Self Fed;Straw Pharyngeal  Phase Impairments: Throat Clearing - Delayed;Throat Clearing - Immediate    Nectar Thick Nectar Thick Liquid: Not tested   Honey Thick Honey Thick Liquid: Not tested   Puree Puree: Impaired Presentation: Self Fed;Spoon Pharyngeal Phase Impairments: Throat Clearing - Immediate;Throat Clearing - Delayed   Solid    Solid: Impaired Presentation: Self Fed Oral Phase Impairments: Impaired mastication      Germain Osgood, M.A. CCC-SLP [phone]  Germain Osgood 10/01/2014,8:51 AM

## 2014-10-01 NOTE — Evaluation (Signed)
Physical Therapy Evaluation Patient Details Name: PETE MERTEN MRN: [id number] DOB: [birth year] Today's Date: 10/01/2014   History of Present Illness  MADDELYNN MOOSMAN is a 80 y.o. female with a history of hypertension, hyperlipidemia, chronic renal insufficiency, diabetes mellitus, atrial fibrillation on anticoagulation in the past which was stopped by the patient secondary to bruising. Admitted for difficulty with speech and left sided weakness. MRI indicates Patchy areas of acute ischemia RIGHT frontal lobe, MCA territory, and Remote bilateral thalamus and RIGHT basal ganglia lacunar infarct.  Clinical Impression  Pt admitted with the above diagnosis. Pt currently with functional limitations due to the deficits listed below (see PT Problem List). Ambulates slowly, near baseline (per family) with a rolling walker for support, no physical assist needed today. States her legs feel weaker than normal but pt did not demonstrate any loss of balance or LE buckling during therapy. She has an aide 4hr/day at home and I feel this, along with HHPT will be adequate to improve and maintain patient's safety and quality of life at d/c. Pt will benefit from skilled PT to increase their independence and safety with mobility to allow discharge to the venue listed below.       Follow Up Recommendations Home health PT;Supervision - Intermittent    Equipment Recommendations  None recommended by PT    Recommendations for Other Services       Precautions / Restrictions Precautions Precautions: Fall Restrictions Weight Bearing Restrictions: No      Mobility  Bed Mobility               General bed mobility comments: Sitting EOB  Transfers Overall transfer level: Needs assistance Equipment used: Rolling walker (2 wheeled) Transfers: Sit to/from [facility] Sit to Stand: Min guard Stand pivot transfers: Min guard       General transfer comment: moves very slowly    Ambulation/Gait Ambulation/Gait assistance: Supervision Ambulation Distance (Feet): 80 Feet Assistive device: Rolling walker (2 wheeled) Gait Pattern/deviations: Step-through pattern;Step-to pattern;Decreased stride length;Decreased dorsiflexion - left;Shuffle;Trunk flexed Gait velocity: slow Gait velocity interpretation: Below normal speed for age/gender General Gait Details: Very slow gait speed. Son reports this is baseline. Intermittent step-to pattern; cues for larger step length. Slight difficulty with clearance of Lt foot in swing phase but improves with cues for awareness. No loss of balance during bout. Good control of RW.  Stairs            Wheelchair Mobility    Modified Rankin (Stroke Patients Only) Modified Rankin (Stroke Patients Only) Pre-Morbid Rankin Score: Moderate disability Modified Rankin: Moderately severe disability     Balance Overall balance assessment: Needs assistance Sitting-balance support: Feet supported Sitting balance-Leahy Scale: Good     Standing balance support: Bilateral upper extremity supported Standing balance-Leahy Scale: Poor                               Pertinent Vitals/Pain Pain Assessment: Faces Faces Pain Scale: Hurts a little bit Pain Location: Rt hand volar surface MCP joints with flexion  Pain Descriptors / Indicators: Aching Pain Intervention(s): Monitored during session    Home Living Family/patient expects to be discharged to:: Private residence Living Arrangements: Alone Available Help at Discharge: Personal care attendant (4 hr/day 7 days/wk ) Type of Home: Independent living facility Home Access: Level entry     Home Layout: One level Home Equipment: Walker - 4 wheels;Toilet riser;Tub bench  Prior Function Level of Independence: Needs assistance   Gait / Transfers Assistance Needed: Uses rollator when ambulating at all times  ADL's / Homemaking Assistance Needed: States the aide  assists with bath/dress and "the basics" including cooking.        Hand Dominance   Dominant Hand: Right    Extremity/Trunk Assessment   Upper Extremity Assessment: Generalized weakness           Lower Extremity Assessment: Defer to PT evaluation      Cervical / Trunk Assessment: Kyphotic  Communication   Communication: Expressive difficulties (slurred speech)  Cognition Arousal/Alertness: Awake/alert Behavior During Therapy: WFL for tasks assessed/performed Overall Cognitive Status: Within Functional Limits for tasks assessed                      General Comments General comments (skin integrity, edema, etc.): family present and feel she is close to her baseline.  They feel comfortable with discharge to home, but requesting [facility] therapies     Exercises        Assessment/Plan    PT Assessment Patient needs continued PT services  PT Diagnosis Abnormality of gait;Generalized weakness;Hemiplegia non-dominant side   PT Problem List Decreased strength;Decreased range of motion;Decreased activity tolerance;Decreased balance;Decreased mobility;Decreased knowledge of use of DME  PT Treatment Interventions DME instruction;Gait training;Functional mobility training;Therapeutic activities;Therapeutic exercise;Balance training;Neuromuscular re-education;Patient/family education   PT Goals (Current goals can be found in the Care Plan section) Acute Rehab PT Goals Patient Stated Goal: Pt did not state  PT Goal Formulation: With patient Time For Goal Achievement: 10/15/14 Potential to Achieve Goals: Good    Frequency Min 4X/week   Barriers to discharge Decreased caregiver support Caregiver 4hr/7days/week - Son checks on pt.    Co-evaluation               End of Session Equipment Utilized During Treatment: Gait belt Activity Tolerance: Patient tolerated treatment well Patient left: in chair;with call bell/phone within reach;with family/visitor present Nurse  Communication: Mobility status         Time: 1517-6160 (-5 minutes non-therapeutic while MD spoke with pt.) PT Time Calculation (min) (ACUTE ONLY): 39 min   Charges:   PT Evaluation $Initial PT Evaluation Tier I: 1 Procedure PT Treatments $Gait Training: 8-22 mins   PT G CodesEllouise Newer 10/01/2014, 1:19 PM Elayne Snare, [place]

## 2014-10-01 NOTE — Evaluation (Signed)
Occupational Therapy Evaluation Patient Details Name: DRUSILLA WAMPOLE MRN: [id number] DOB: 1934/12/11 Today's Date: 10/01/2014    History of Present Illness [name] is a 80 y.o. female with a history of hypertension, hyperlipidemia, chronic renal insufficiency, diabetes mellitus, atrial fibrillation on anticoagulation in the past which was stopped by the patient secondary to bruising. Admitted for difficulty with speech and left sided weakness. MRI indicates Patchy areas of acute ischemia RIGHT frontal lobe, MCA territory, and Remote bilateral thalamus and RIGHT basal ganglia lacunar infarct.   Clinical Impression   Pt admitted with above.  She presents with generalized weakness. She is close to her baseline per her and her daughter.   Overall, she requires min A for BADLs.  Feel she would benefit from [facility] to maximize safety and independence in her home environment.  All further OT needs can be met with HHOT therefore, acute OT signing off.     Follow Up Recommendations  Home health OT;Supervision/Assistance - 24 hour    Equipment Recommendations  None recommended by OT    Recommendations for Other Services       Precautions / Restrictions Precautions Precautions: Fall Restrictions Weight Bearing Restrictions: No      Mobility Bed Mobility                  Transfers Overall transfer level: Needs assistance   Transfers: Sit to/from Stand;Stand Pivot Transfers Sit to Stand: Min guard Stand pivot transfers: Min guard       General transfer comment: moves very slowly     Balance Overall balance assessment: Needs assistance Sitting-balance support: Feet supported Sitting balance-Leahy Scale: Good                                      ADL Overall ADL's : Needs assistance/impaired Eating/Feeding: Modified independent;Sitting   Grooming: Wash/dry hands;Oral care;Brushing hair;Min guard;Standing   Upper Body Bathing: Set up;Sitting   Lower  Body Bathing: Minimal assistance;Sit to/from stand   Upper Body Dressing : Set up;Sitting   Lower Body Dressing: Min guard;Sit to/from stand   Toilet Transfer: Min guard;Ambulation;Comfort height toilet;RW;Grab bars;BSC   Toileting- Clothing Manipulation and Hygiene: Minimal assistance;Sit to/from stand Toileting - Clothing Manipulation Details (indicate cue type and reason): Pt incontinent of small amount of stool.  Required assist for peri care      Functional mobility during ADLs: Min guard;Rolling walker General ADL Comments: Pt moves slowly.   Falls asleep as soon as she sits down.  Famly reports this is her baseline.      Vision Vision Assessment?: No apparent visual deficits   Perception Perception Perception Tested?: Yes   Praxis Praxis Praxis tested?: Within functional limits    Pertinent Vitals/Pain Pain Assessment: Faces Faces Pain Scale: Hurts a little bit Pain Location: Rt hand volar surface MCP joints with flexion  Pain Descriptors / Indicators: Aching Pain Intervention(s): Monitored during session     Hand Dominance Right   Extremity/Trunk Assessment Upper Extremity Assessment Upper Extremity Assessment: Generalized weakness   Lower Extremity Assessment Lower Extremity Assessment: Defer to PT evaluation   Cervical / Trunk Assessment Cervical / Trunk Assessment: Kyphotic   Communication     Cognition Arousal/Alertness: Awake/alert Behavior During Therapy: WFL for tasks assessed/performed Overall Cognitive Status: Within Functional Limits for tasks assessed  General Comments       Exercises       Shoulder Instructions      Home Living Family/patient expects to be discharged to:: Private residence Living Arrangements: Alone Available Help at Discharge: Personal care attendant (4 hr/day 7 days/wk ) Type of Home: Independent living facility Home Access: Level entry     Home Layout: One level     Bathroom  Shower/Tub: Tub/shower unit;Curtain Shower/tub characteristics: Architectural technologist: Standard Bathroom Accessibility: Yes How Accessible: Accessible via walker Home Equipment: [place] - 4 wheels;Toilet riser;Tub bench          Prior Functioning/Environment Level of Independence: Needs assistance  Gait / Transfers Assistance Needed: Uses rollator when ambulating at all times ADL's / Homemaking Assistance Needed: States the aide assists with bath/dress and "the basics" including cooking.        OT Diagnosis: Generalized weakness   OT Problem List: Decreased strength;Decreased activity tolerance;Impaired balance (sitting and/or standing)   OT Treatment/Interventions:      OT Goals(Current goals can be found in the care plan section) Acute Rehab OT Goals Patient Stated Goal: Pt did not state  OT Goal Formulation: All assessment and education complete, DC therapy  OT Frequency:     Barriers to D/C:            Co-evaluation              End of Session Equipment Utilized During Treatment: Rolling walker Nurse Communication: Mobility status  Activity Tolerance: Patient tolerated treatment well Patient left: in chair;with call bell/phone within reach;with chair alarm set;with family/visitor present   Time: 7035-0093 OT Time Calculation (min): 26 min Charges:  OT General Charges $OT Visit: 1 Procedure OT Evaluation $Initial OT Evaluation Tier I: 1 Procedure OT Treatments $Self Care/Home Management : 8-22 mins G-Codes:    Girolamo Lortie M 2014-10-26, 12:01 PM

## 2014-10-02 ENCOUNTER — Inpatient Hospital Stay (HOSPITAL_COMMUNITY): Payer: Medicare Other

## 2014-10-02 DIAGNOSIS — G459 Transient cerebral ischemic attack, unspecified: Secondary | ICD-10-CM

## 2014-10-02 DIAGNOSIS — E119 Type 2 diabetes mellitus without complications: Secondary | ICD-10-CM

## 2014-10-02 LAB — BASIC METABOLIC PANEL
Anion gap: 9 (ref 5–15)
BUN: 40 mg/dL — ABNORMAL HIGH (ref 6–20)
CO2: 22 mmol/L (ref 22–32)
Calcium: 8.5 mg/dL — ABNORMAL LOW (ref 8.9–10.3)
Chloride: 107 mmol/L (ref 101–111)
Creatinine, Ser: 2.29 mg/dL — ABNORMAL HIGH (ref 0.44–1.00)
GFR calc non Af Amer: 19 mL/min — ABNORMAL LOW (ref >60)
GFR, EST AFRICAN AMERICAN: 23 mL/min — AB (ref >60)
GLUCOSE: 94 mg/dL (ref 65–99)
POTASSIUM: 4.6 mmol/L (ref 3.5–5.1)
Sodium: 138 mmol/L (ref 135–145)

## 2014-10-02 LAB — CBC
HEMATOCRIT: 30.4 % — AB (ref 36.0–46.0)
Hemoglobin: 9.7 g/dL — ABNORMAL LOW (ref 12.0–15.0)
MCH: 27.8 pg (ref 26.0–34.0)
MCHC: 31.9 g/dL (ref 30.0–36.0)
MCV: 87.1 fL (ref 78.0–100.0)
Platelets: 121 10*3/uL — ABNORMAL LOW (ref 150–400)
RBC: 3.49 MIL/uL — ABNORMAL LOW (ref 3.87–5.11)
RDW: 15.4 % (ref 11.5–15.5)
WBC: 7.1 10*3/uL (ref 4.0–10.5)

## 2014-10-02 LAB — GLUCOSE, CAPILLARY
GLUCOSE-CAPILLARY: 155 mg/dL — AB (ref 65–99)
GLUCOSE-CAPILLARY: 124 mg/dL — AB (ref 65–99)
Glucose-Capillary: 81 mg/dL (ref 65–99)
Glucose-Capillary: 97 mg/dL (ref 65–99)
Glucose-Capillary: 169 mg/dL — ABNORMAL HIGH (ref 65–99)

## 2014-10-02 MED ORDER — APIXABAN 5 MG PO TABS: 5.0000 mg | ORAL_TABLET | Freq: Two times a day (BID) | ORAL | Status: DC

## 2014-10-02 MED ORDER — ATORVASTATIN CALCIUM 40 MG PO TABS: 40.0000 mg | ORAL_TABLET | Freq: Every day | ORAL | Status: DC

## 2014-10-02 MED ORDER — MUPIROCIN CALCIUM 2 % EX CREA
TOPICAL_CREAM | Freq: Every day | CUTANEOUS | Status: DC
  Administered 2014-10-02 – 2014-10-03 (×2): via TOPICAL
  Filled 2014-10-02 (×2): qty 15

## 2014-10-02 MED ORDER — ASPIRIN 81 MG PO TBEC: 81.0000 mg | DELAYED_RELEASE_TABLET | Freq: Every day | ORAL | Status: DC

## 2014-10-02 NOTE — Progress Notes (Signed)
Physical Therapy Treatment Patient Details Name: [name] MRN: [id number] DOB: Mar 25, 1935 Today's Date: 10/02/2014    History of Present Illness CHI WOODHAM is a 80 y.o. female with a history of hypertension, hyperlipidemia, chronic renal insufficiency, diabetes mellitus, atrial fibrillation on anticoagulation in the past which was stopped by the patient secondary to bruising. Admitted for difficulty with speech and left sided weakness. MRI indicates Patchy areas of acute ischemia RIGHT frontal lobe, MCA territory, and Remote bilateral thalamus and RIGHT basal ganglia lacunar infarct.    PT Comments    Pt falling asleep during PT session except ambulation. Pt currently unsafe to return home at this time due to only having assist 4 hours a day. Pt at increased falls risk especially during dynamic standing tasks as her standing stamina is poor. Pt fatigues easily. Pt to benefit from ST-SNF to achieve safe mod I level of function for safe transition home.  Follow Up Recommendations  SNF     Equipment Recommendations  None recommended by PT    Recommendations for Other Services       Precautions / Restrictions Precautions Precautions: Fall Restrictions Weight Bearing Restrictions: No    Mobility  Bed Mobility Overal bed mobility: Needs Assistance Bed Mobility: Supine to Sit;Sit to Supine     Supine to sit: Min assist Sit to supine: Min assist   General bed mobility comments: sitting in chair, pt reports she could not get out of bed by herself earlier today  Transfers Overall transfer level: Needs assistance Equipment used: Rolling walker (2 wheeled) Transfers: Sit to/from [facility] Sit to Stand: Min assist Stand pivot transfers: Min guard       General transfer comment: Pt unable to get out of chair. Required vc to scoot hips forwar to edge of chair before trying to stand  Ambulation/Gait Ambulation/Gait assistance: Min guard Ambulation  Distance (Feet): 100 Feet Assistive device: Rolling walker (2 wheeled)       General Gait Details: pt increased step length this date (pt recalled last PT to instruct her to increase step length) Pt with decreased cadence. took 1 standing rest break.   Stairs            Wheelchair Mobility    Modified Rankin (Stroke Patients Only) Modified Rankin (Stroke Patients Only) Pre-Morbid Rankin Score: Moderate disability Modified Rankin: Moderately severe disability     Balance Overall balance assessment: Needs assistance   Sitting balance-Leahy Scale: Good     Standing balance support: During functional activity;Bilateral upper extremity supported Standing balance-Leahy Scale: Poor                      Cognition Arousal/Alertness: Lethargic Behavior During Therapy: WFL for tasks assessed/performed Overall Cognitive Status: History of cognitive impairments - at baseline (per daughter)                      Exercises General Exercises - Lower Extremity Ankle Circles/Pumps: AROM;Both;10 reps Long Arc Quad: AROM;Strengthening;Both;10 reps Heel Slides: AROM;Strengthening;Both;10 reps (resisted) Hip ABduction/ADduction: AROM;Strengthening;Both;10 reps Other Exercises Other Exercises: LUE general strengthening. Encouraged pt to use L hand as much as possible.     General Comments        Pertinent Vitals/Pain Pain Assessment: No/denies pain    Home Living Family/patient expects to be discharged to:: Private residence Living Arrangements: Alone Available Help at Discharge: Personal care attendant Type of Home: Independent living facility Home Access: Level entry   Home Layout:  One level Home Equipment: [place] - 4 wheels;Toilet riser;Tub bench      Prior Function Level of Independence: Needs assistance  Gait / Transfers Assistance Needed: Uses rollator when ambulating at all times ADL's / Homemaking Assistance Needed: daughter states that her  motheris uaually able to take herself to the bathroom and is mod I with toileting and does her basic ADL when aide is not around.     PT Goals (current goals can now be found in the care plan section) Acute Rehab PT Goals Patient Stated Goal: to get stronger Progress towards PT goals: Progressing toward goals    Frequency  Min 3X/week    PT Plan Discharge plan needs to be updated;Frequency needs to be updated    Co-evaluation             End of Session Equipment Utilized During Treatment: Gait belt Activity Tolerance: Patient tolerated treatment well Patient left: in chair;with call bell/phone within reach;with family/visitor present     Time: 1341-1413 PT Time Calculation (min) (ACUTE ONLY): 32 min  Charges:  $Gait Training: 8-22 mins $Therapeutic Exercise: 8-22 mins                    G Codes:      Kingsley Callander 10/02/2014, 4:19 PM   Kittie Plater, PT, DPT Pager #: [phone] Office #: [phone]

## 2014-10-02 NOTE — Discharge Instructions (Signed)

## 2014-10-02 NOTE — Clinical Social Work Placement (Signed)
   CLINICAL SOCIAL WORK PLACEMENT  NOTE  Date:  10/02/2014  Patient Details  Name: [name] MRN: [id number] Date of Birth: February 28, 1935  Clinical Social Work is seeking post-discharge placement for this patient at the [place] level of care (*CSW will initial, date and re-position this form in  chart as items are completed):  Yes   Patient/family provided with [facility] Work Department's list of facilities offering this level of care within the geographic area requested by the patient (or if unable, by the patient's family).  Yes   Patient/family informed of their freedom to choose among providers that offer the needed level of care, that participate in Medicare, Medicaid or managed care program needed by the patient, have an available bed and are willing to accept the patient.  Yes   Patient/family informed of [facility]'s ownership interest in [facility] and [facility], as well as of the fact that they are under no obligation to receive care at these facilities.  PASRR submitted to EDS on 10/02/14     PASRR number received on 10/02/14     Existing PASRR number confirmed on       FL2 transmitted to all facilities in geographic area requested by pt/family on 10/02/14     FL2 transmitted to all facilities within larger geographic area on 10/02/14     Patient informed that his/her managed care company has contracts with or will negotiate with certain facilities, including the following:            Patient/family informed of bed offers received.  Patient chooses bed at       Physician recommends and patient chooses bed at      Patient to be transferred to   on  .  Patient to be transferred to facility by       Patient family notified on   of transfer.  Name of family member notified:        PHYSICIAN Please sign FL2     Additional Comment:    _______________________________________________ Glendon Axe, MSW, LCSWA [phone] 10/02/2014 3:12 PM

## 2014-10-02 NOTE — Progress Notes (Addendum)
Benefits check for [name]: Pt copay will be $2.95 for generic and $7.40 for brand- Josem Kaufmann is required [phone]

## 2014-10-02 NOTE — Progress Notes (Signed)
[name]   HISTORY [name] is a 80 y.o. female with a history of hypertension, hyperlipidemia, chronic renal insufficiency, diabetes mellitus, atrial fibrillation on anticoagulation in the past which was stopped by the patient secondary to bruising. Since that time she has been on aspirin 325 mg daily. Last evening the patient was speaking on the telephone sometime between 8 and 9 PM when she noted she was having difficulty with her speech. Her words seemed slurred. When she got off of the phone she attempted to ambulate with her walker and noted left sided weakness in the upper extremity more so than the lower extremity. There was no one with her at that time and she did not seek medical attention. She was brought to the emergency department today by her son and daughter for further evaluation. They noted that the patient had been choking while trying to eat. She has residual deficits although the family feels that they are somewhat improved.    Date last known well: Date: 09/29/2014 Time last known well: Unable to determine tPA Given: No: The patient was out of the window for therapeutic treatment.   SUBJECTIVE (INTERVAL HISTORY) Daughter is  at the bedside. The patient feels she is improving. Patient agreeable to switch to eliquis and understand that this is contingent on her renal function remaining stable. If her renal function declines further she may need to switch back to warfarin   OBJECTIVE Temp:  [97.6 F (36.4 C)-99 F (37.2 C)] 98.1 F (36.7 C) (05/16 0533) Pulse Rate:  [74-95] 88 (05/16 1016) Cardiac Rhythm:  [-]  Resp:  [18-20] 20 (05/16 1016) BP: (86-157)/(47-92) 157/64 mmHg (05/16 1016) SpO2:  [95 %-100 %] 100 % (05/16 1016)   Recent Labs Lab 10/01/14 1118 10/01/14 1618 10/01/14 2142 10/01/14 2357 10/02/14 0639  GLUCAP 152* 146* 171* 100* 97    Recent Labs Lab 09/30/14 1330 10/01/14 0144 10/02/14 0349  NA 138 139 138  K 4.9 4.5 4.6   CL 108 107 107  CO2 19* 22 22  GLUCOSE 89 106* 94  BUN 45* 40* 40*  CREATININE 2.39* 2.23* 2.29*  CALCIUM 9.0 8.9 8.5*    Recent Labs Lab 09/30/14 1330  AST 18  ALT 11*  ALKPHOS 77  BILITOT 0.6  PROT 7.5  ALBUMIN 3.8    Recent Labs Lab 09/30/14 1330 10/01/14 0144 10/02/14 0349  WBC 7.9 7.0 7.1  HGB 10.6* 10.0* 9.7*  HCT 32.8* 31.8* 30.4*  MCV 86.3 86.4 87.1  PLT 129* 123* 121*    Recent Labs Lab 10/01/14 0144 10/01/14 0909 10/01/14 1343  TROPONINI 0.06* 0.06* 0.06*    Recent Labs  09/30/14 1330  LABPROT 15.7*  INR 1.23   No results for input(s): COLORURINE, LABSPEC, PHURINE, GLUCOSEU, HGBUR, BILIRUBINUR, KETONESUR, PROTEINUR, UROBILINOGEN, NITRITE, LEUKOCYTESUR in the last 72 hours.  Invalid input(s): APPERANCEUR     Component Value Date/Time   CHOL 180 10/01/2014 0144   TRIG 41 10/01/2014 0144   HDL 81 10/01/2014 0144   CHOLHDL 2.2 10/01/2014 0144   VLDL 8 10/01/2014 0144   LDLCALC 91 10/01/2014 0144   Lab Results  Component Value Date   HGBA1C 6.6 08/02/2014      Component Value Date/Time   LABOPIA NONE DETECTED 09/30/2014 1450   COCAINSCRNUR NONE DETECTED 09/30/2014 1450   LABBENZ NONE DETECTED 09/30/2014 1450   AMPHETMU NONE DETECTED 09/30/2014 1450   THCU NONE DETECTED 09/30/2014 1450   LABBARB NONE DETECTED 09/30/2014 1450  Recent Labs Lab 09/30/14 1330  ETH <5    Ct Head Wo Contrast 09/30/2014    No acute intracranial findings.  Chronic ischemic microvascular disease in a few small old bilateral central lacunar infarcts.      Mr Jodene Nam Head Wo Contrast 09/30/2014     MRI HEAD:  Moderately motion degraded examination.  Patchy areas of acute ischemia RIGHT frontal lobe, MCA territory.  Involutional changes.  At least mild white matter changes likely represent chronic small vessel ischemic disease.  Remote bilateral thalamus and RIGHT basal ganglia lacunar infarct.    MRA HEAD:  Moderately motion degraded examination  without large vessel occlusion or definite high-grade stenosis though limited assessment at the circle of Willis.    PHYSICAL EXAM Elderly female not in distress. . Afebrile. Head is nontraumatic. Neck is supple without bruit.    Cardiac exam no murmur or gallop. Lungs are clear to auscultation. Distal pulses are well felt.  Neurological Exam : Awake alert oriented x 3 normal speech and language. Mild left lower face asymmetry. Tongue midline. No drift. Mild diminished fine finger movements on left. Orbits right over left upper extremity. Mild left grip weak.. Normal sensation . Normal coordination.  ASSESSMENT/PLAN [name] is a 80 y.o. female with history of hypertension, hyperlipidemia, chronic renal insufficiency, diabetes mellitus, atrial fibrillation anticoagulated in the past but stopped secondary to bruising, presenting with transient speech difficulties, left-sided weakness and choking while trying to eat.  She did not receive IV t-PA due to delayed presentation.  Small Stacy right frontal lobe, MCA territory:  Non-dominant - probably embolic secondary to atrial fibrillation.  Resultant  left upper extremity weakness  MRI - patchy areas of acute ischemia right frontal lobe, MCA territory  MRA  no high-grade stenosis. Carotid Doppler - 1-39% ICA stenosis. Vertebral artery flow is antegrade.     2D Echo - Left ventricle: The cavity size was normal. There was mild concentric hypertrophy. Systolic function was moderately reduced. The estimated ejection fraction was in the range of 35% to 40%. Hypokinesis of the mid-apicalanteroseptal, anterior, inferior, and apical myocardium.   LDL - 91  HgbA1c - 6.6 (on 08/02/2014)  Subcutaneous heparin for VTE prophylaxis DIET DYS 2 Room service appropriate?: Yes; Fluid consistency:: Thin  aspirin 325 mg orally every day prior to admission, now on aspirin 325 mg orally every day  Ongoing aggressive Stacy risk factor  management  Therapy recommendations:  Home health physical and occupational therapies recommended  Disposition:  Pending  Hypertension  Home meds: Lisinopril and metoprolol,  Blood pressure labile  Hyperlipidemia  Home meds:  Pravachol 20 mg daily resumed in hospital  LDL 91, goal < 70  Pravachol changed to Lipitor 40 mg daily  Continue statin at discharge  Diabetes  HgbA1c -6.6 (on 08/02/2014)  , goal < 7.0  Controlled  Other Stacy Risk Factors  Advanced age  Atrial fibrillation without anticoagulation  Obesity, There is no weight on file to calculate BMI.   Family hx Stacy (brother and sister)  Other Active Problems  Chronic renal insufficiency  Anemia  PLAN   Agree with changing to Eliquis for anticoagulation but needs close monitoring of renal function and if there is further decline in it may need to switch back to warfarin. Patient and family understand and agree with plan.[facility]acy team will sign off. Call for questions..  Follow up in [facility] clinic in 2 months   Hospital day # [place],  MD Medical Director [facility] Pager: [phone] 10/02/2014 11:35 AM     To contact [facility] provider, please refer to [email or web address] After hours, contact General Neurology

## 2014-10-02 NOTE — Progress Notes (Signed)
VASCULAR LAB PRELIMINARY  PRELIMINARY  PRELIMINARY  PRELIMINARY  Carotid Dopplers completed.    Preliminary report:  1-39% ICA stenosis.  Vertebral artery flow is antegrade.   Alyxandria Wentz, RVT 10/02/2014, 8:06 AM

## 2014-10-02 NOTE — Progress Notes (Addendum)
Subjective: NAEO. She is doing well this morning. She is concerned about her ability to transfer out of bed for discharge home. Otherwise no new complaints.  Objective: Vital signs in last 24 hours: Filed Vitals:   10/01/14 1713 10/01/14 2200 10/02/14 0200 10/02/14 0533  BP: 141/61 136/47 137/92 136/62  Pulse: 81 95 87 74  Temp: 97.6 F (36.4 C) 99 F (37.2 C) 98.2 F (36.8 C) 98.1 F (36.7 C)  TempSrc: Axillary Oral Oral Oral  Resp: 20 18 19 20   SpO2: 95% 99% 99% 100%   Weight change:  No intake or output data in the 24 hours ending 10/02/14 0747 Gen: No acute distress HEENT: Atraumatic, PERRL, EOMI, sclerae anicteric, moist mucous membranes Heart: irregularly irregular, normal S1 S2, no murmurs, rubs, or gallops Lungs: Clear to auscultation bilaterally, respirations unlabored Abd: Soft, non-tender, non-distended Extremities: b/l toe amputations well healed Neuro: A&O x 4, CN II-XII intact, finger-nose-finger coordination intact, L grip 4/5 otherwise strength 5/5 and symmetric in all extremities, sensation grossly intact   Lab Results: Basic Metabolic Panel:  Recent Labs Lab 10/01/14 0144 10/02/14 0349  NA 139 138  K 4.5 4.6  CL 107 107  CO2 22 22  GLUCOSE 106* 94  BUN 40* 40*  CREATININE 2.23* 2.29*  CALCIUM 8.9 8.5*   Liver Function Tests:  Recent Labs Lab 09/30/14 1330  AST 18  ALT 11*  ALKPHOS 77  BILITOT 0.6  PROT 7.5  ALBUMIN 3.8   CBC:  Recent Labs Lab 10/01/14 0144 10/02/14 0349  WBC 7.0 7.1  HGB 10.0* 9.7*  HCT 31.8* 30.4*  MCV 86.4 87.1  PLT 123* 121*   Cardiac Enzymes:  Recent Labs Lab 10/01/14 0144 10/01/14 0909 10/01/14 1343  TROPONINI 0.06* 0.06* 0.06*   CBG:  Recent Labs Lab 10/01/14 0626 10/01/14 1118 10/01/14 1618 10/01/14 2142 10/01/14 2357 10/02/14 0639  GLUCAP 99 152* 146* 171* 100* 97   Fasting Lipid Panel:  Recent Labs Lab 10/01/14 0144  CHOL 180  HDL 81  LDLCALC 91  TRIG 41  CHOLHDL 2.2    Coagulation:  Recent Labs Lab 09/30/14 1330  LABPROT 15.7*  INR 1.23   Studies/Results: Ct Head Wo Contrast  09/30/2014   CLINICAL DATA:  Slurred speech last night. Left-sided facial droop and left-sided weakness today.  EXAM: CT HEAD WITHOUT CONTRAST  TECHNIQUE: Contiguous axial images were obtained from the base of the skull through the vertex without intravenous contrast.  COMPARISON:  10/21/2010  FINDINGS: Ventricles, cisterns and other CSF spaces are within normal. There is no mass, mass effect, shift of midline structures or acute hemorrhage. There is evidence of mild chronic ischemic microvascular disease. No definite acute infarction. Possible old lacunar infarcts over the thalamus bilaterally, right lentiform nucleus and left insular region. Remaining bony structures are within normal. There is deviation of the nasal septum to the left.  IMPRESSION: No acute intracranial findings.  Chronic ischemic microvascular disease in a few small old bilateral central lacunar infarcts.   Electronically Signed   By: Marin Olp M.D.   On: 09/30/2014 14:26   Mr Jodene Nam Head Wo Contrast  09/30/2014   CLINICAL DATA:  Speech difficulty beginning last night, associated with LEFT-sided weakness. History of hypertension, hyperlipidemia, chronic renal failure, diabetes, atrial fibrillation, not on anti coagulation.  EXAM: MRI HEAD WITHOUT CONTRAST  MRA HEAD WITHOUT CONTRAST  TECHNIQUE: Multiplanar, multiecho pulse sequences of the brain and surrounding structures were obtained without intravenous contrast. Angiographic images of the head  were obtained using MRA technique without contrast.  COMPARISON:  CT of the head Sep 30, 2014 at 1408 hours  FINDINGS: MRI HEAD FINDINGS  Moderately motion degraded examination.  Patchy reduced diffusion within a 3 x 1.7 cm distribution with low ADC values in RIGHT frontal lobe. No susceptibility artifact to suggest hemorrhage.  The ventricles and sulci are normal for patient's  age. Patchy supratentorial white matter T2 hyperintensities may be underestimated due to motion. No midline shift or mass effect. No definite mass lesions though motion degrades sensitivity for subtle parenchymal signal abnormality. Remote RIGHT basal ganglia, bilateral thalamus lacunar infarcts.  No abnormal extra-axial fluid collections. Status post bilateral ocular lens implants. Mild paranasal sinus mucosal thickening with atretic LEFT maxillary sinus most consistent with chronic sinusitis. The mastoid air cells appear well aerated.  No abnormal sellar expansion. No cerebellar tonsillar ectopia. No suspicious calvarial bone marrow signal. Patient appears edentulous.  MRA HEAD FINDINGS  Moderately motion degraded examination limits the sensitivity for luminal irregularity or low grade stenosis. Motion results in duplicated appearance of the vessels at the circle of Willis.  Flow related enhancement within the cervical, petrous, cavernous and supra clinoid internal carotid arteries. Patent anterior communicating artery with flow related enhancement within the anterior and middle cerebral arteries including more distal segments.  Flow related enhancement within the bilateral vertebral arteries, basilar artery, which demonstrates duplicated appearance due to motion. Normal flow related enhancement of the posterior cerebral arteries, including distal segments.  No large vessel occlusion.  IMPRESSION: MRI HEAD: Moderately motion degraded examination. Patchy areas of acute ischemia RIGHT frontal lobe, MCA territory.  Involutional changes. At least mild white matter changes likely represent chronic small vessel ischemic disease. Remote bilateral thalamus and RIGHT basal ganglia lacunar infarct.  MRA HEAD: Moderately motion degraded examination without large vessel occlusion or definite high-grade stenosis though limited assessment at the circle of Willis.   Electronically Signed   By: Elon Alas   On: 09/30/2014  23:03   Mri Brain Without Contrast  09/30/2014   CLINICAL DATA:  Speech difficulty beginning last night, associated with LEFT-sided weakness. History of hypertension, hyperlipidemia, chronic renal failure, diabetes, atrial fibrillation, not on anti coagulation.  EXAM: MRI HEAD WITHOUT CONTRAST  MRA HEAD WITHOUT CONTRAST  TECHNIQUE: Multiplanar, multiecho pulse sequences of the brain and surrounding structures were obtained without intravenous contrast. Angiographic images of the head were obtained using MRA technique without contrast.  COMPARISON:  CT of the head Sep 30, 2014 at 1408 hours  FINDINGS: MRI HEAD FINDINGS  Moderately motion degraded examination.  Patchy reduced diffusion within a 3 x 1.7 cm distribution with low ADC values in RIGHT frontal lobe. No susceptibility artifact to suggest hemorrhage.  The ventricles and sulci are normal for patient's age. Patchy supratentorial white matter T2 hyperintensities may be underestimated due to motion. No midline shift or mass effect. No definite mass lesions though motion degrades sensitivity for subtle parenchymal signal abnormality. Remote RIGHT basal ganglia, bilateral thalamus lacunar infarcts.  No abnormal extra-axial fluid collections. Status post bilateral ocular lens implants. Mild paranasal sinus mucosal thickening with atretic LEFT maxillary sinus most consistent with chronic sinusitis. The mastoid air cells appear well aerated.  No abnormal sellar expansion. No cerebellar tonsillar ectopia. No suspicious calvarial bone marrow signal. Patient appears edentulous.  MRA HEAD FINDINGS  Moderately motion degraded examination limits the sensitivity for luminal irregularity or low grade stenosis. Motion results in duplicated appearance of the vessels at the circle of Willis.  Flow related  enhancement within the cervical, petrous, cavernous and supra clinoid internal carotid arteries. Patent anterior communicating artery with flow related enhancement within  the anterior and middle cerebral arteries including more distal segments.  Flow related enhancement within the bilateral vertebral arteries, basilar artery, which demonstrates duplicated appearance due to motion. Normal flow related enhancement of the posterior cerebral arteries, including distal segments.  No large vessel occlusion.  IMPRESSION: MRI HEAD: Moderately motion degraded examination. Patchy areas of acute ischemia RIGHT frontal lobe, MCA territory.  Involutional changes. At least mild white matter changes likely represent chronic small vessel ischemic disease. Remote bilateral thalamus and RIGHT basal ganglia lacunar infarct.  MRA HEAD: Moderately motion degraded examination without large vessel occlusion or definite high-grade stenosis though limited assessment at the circle of Willis.   Electronically Signed   By: Elon Alas   On: 09/30/2014 23:03   Medications: I have reviewed the patient's current medications. Scheduled Meds: .  stroke: mapping our early stages of recovery book   Does not apply Once  . apixaban  5 mg Oral BID  . aspirin EC  81 mg Oral Daily  . atorvastatin  40 mg Oral q1800  . insulin aspart  0-5 Units Subcutaneous QHS  . insulin aspart  0-9 Units Subcutaneous TID WC  . levothyroxine  112 mcg Oral QAC breakfast  . metoprolol  25 mg Oral BID  . pantoprazole  40 mg Oral Daily   Continuous Infusions:  PRN Meds:. Assessment/Plan:  CVA: Acute ischemia in the R frontal lobe (MCA territory) on MRI as well as remote bilateral thalamus and R basal ganglia lacunar infarct. MRA without large vessel occlusion or definite high-grade stenosis. SLP recomemnd DYS 2 diet. Echo with LV EF 35-40%, hypokinesis of the mid-apicalanteroseptal, anterior, inferior,and apical myocardium. Prelim carotid dopplers 1-39% bilateral. -appreciate neuro, SLP -hold antihypertensives for permissive hypertension -ASA 81 mg po daily -Eliquis 5mg  BID -DYS 2 diet -Follow up with stroke  clinic in 2 months  Atrial fibrillation: She is on lopressor 25 mg bid at home.  -Eliquis 5mg  BID -cont lopressor 25 mg bid to prevent RVR  HTN: At home she is on lisinopril 40 mg daily, lopressor 25 mg bid. -hold lisinopril in setting of permissive HTN -cont lopressor to prevent atrial fibrillation with RVR  DM2: Last hemoglobin A1c 08/02/14 was 6.6. At home she is on glipizide 10 mg bid, januvia 25 mg daily which were held.  -hold glipizide, januvia -SSI sensitive  Hypothyroidism: Cont home synthroid 112 mcg daily  Hyperlipidemia: Continue atorvastatin 40 mg daily  CKD stage 4: Creatinine today 2.29, down from 2.39 on presentation which is at baseline for past few years. -cont to monitor  Anemia of Chronic Disease: Hemoglobin today is 9.7, relatively stable from admission 10.6 which is at her baseline 10-11, with MCV 86. Last anemia panel 04/2013 w iron 55, TIBC 208, ferritin 250 suggests this is likely related to her worsening renal function. -cont to monitor  FEN: DYS 2  VTE PPx: heparin 5000 u [place] tid  Dispo: Disposition is deferred at this time, awaiting improvement of current medical problems.  Anticipated discharge in approximately 0-1 day(s).   The patient does have a current PCP [name], MD) and does need an [facility] hospital follow-up appointment after discharge.  The patient does not know have transportation limitations that hinder transportation to clinic appointments.  .Services Needed at time of discharge: Y = Yes, Blank = No PT: Home health  OT: Home health  RN:  Equipment:   Other:     LOS: 2 days   Milagros Loll, MD 10/02/2014, 7:47 AM

## 2014-10-02 NOTE — Discharge Summary (Signed)
Name: [name] MRN: [id number] DOB: 1934-05-20 80 y.o. PCP: [name], MD  Date of Admission: 09/30/2014 12:45 PM Date of Discharge: 10/03/2014 Attending Physician: Madilyn Fireman, MD  Discharge Diagnosis: Active Problems:   CVA (cerebral vascular accident)  Discharge Medications:   Medication List    STOP taking these medications        pravastatin 20 MG tablet  Commonly known as:  PRAVACHOL      TAKE these medications        apixaban 5 MG Tabs tablet  Commonly known as:  ELIQUIS  Take 1 tablet (5 mg total) by mouth 2 (two) times daily.     aspirin 81 MG EC tablet  Take 1 tablet (81 mg total) by mouth daily.     atorvastatin 40 MG tablet  Commonly known as:  LIPITOR  Take 1 tablet (40 mg total) by mouth daily at 6 PM.     calcitRIOL 0.25 MCG capsule  Commonly known as:  ROCALTROL  Take 1 capsule by mouth every Monday, Wednesday, and Friday.     glipiZIDE 10 MG tablet  Commonly known as:  GLUCOTROL  Take 1 tablet (10 mg total) by mouth 2 (two) times daily before a meal.     glucose blood test strip  Use as instructed     JANUVIA 25 MG tablet  Generic drug:  sitaGLIPtin  TAKE 1 TABLET BY MOUTH EVERY DAY     Lancets 30G Misc  Use to check blood sugars as directed     levothyroxine 112 MCG tablet  Commonly known as:  SYNTHROID, LEVOTHROID  Take 1 tablet (112 mcg total) by mouth daily before breakfast.     lisinopril 40 MG tablet  Commonly known as:  PRINIVIL,ZESTRIL  TAKE ONE TABLET BY MOUTH ONE TIME DAILY     metoprolol tartrate 25 MG tablet  Commonly known as:  LOPRESSOR  Take 1 tablet (25 mg total) by mouth 2 (two) times daily.     pantoprazole 40 MG tablet  Commonly known as:  PROTONIX  Take 1 tablet (40 mg total) by mouth daily.     Vitamin D3 10000 UNITS capsule  Take 1 capsule (10,000 Units total) by mouth once a week.        Disposition and follow-up:   [name] was discharged from [facility] in  Stable condition.  At the hospital follow up visit please address:  1.  CVA (cerebral vascular accident): Switched from her home statin to high intensity statin, atorvastatin 40mg  daily. Patient was started on eliquis 5mg  BID on 05/16. Will need to monitor renal function.  2.  Labs / imaging needed at time of follow-up: BMP  3.  Pending labs/ test needing follow-up: Final Carotid Doppler  Follow-up Appointments:     Follow-up Information    Follow up with Drucilla Schmidt, MD. Go on 10/09/2014.   Specialty:  Internal Medicine   Why:  1:45PM for hospital follow up   Contact information:   [address] [place] [address] [phone]       Follow up with SETHI,PRAMOD, MD In 2 months.   Specialties:  Neurology, Radiology   Why:  stroke clinic, office will call you for follow up appointment, call earlier if new or worse symptoms    Contact information:   [place] [address] [phone]       Discharge Instructions: Discharge Instructions    Call MD for:  difficulty breathing, headache or visual disturbances    Complete by:  As directed      Call MD for:  persistant dizziness or light-headedness    Complete by:  As directed      Call MD for:  persistant nausea and vomiting    Complete by:  As directed      Call MD for:  severe uncontrolled pain    Complete by:  As directed      Call MD for:  temperature >100.4    Complete by:  As directed      Increase activity slowly    Complete by:  As directed            Consultations: Neurology  Procedures Performed:  Ct Head Wo Contrast  09/30/2014   CLINICAL DATA:  Slurred speech last night. Left-sided facial droop and left-sided weakness today.  EXAM: CT HEAD WITHOUT CONTRAST  TECHNIQUE: Contiguous axial images were obtained from the base of the skull through the vertex without intravenous contrast.  COMPARISON:  10/21/2010  FINDINGS: Ventricles, cisterns and other CSF spaces are within normal. There  is no mass, mass effect, shift of midline structures or acute hemorrhage. There is evidence of mild chronic ischemic microvascular disease. No definite acute infarction. Possible old lacunar infarcts over the thalamus bilaterally, right lentiform nucleus and left insular region. Remaining bony structures are within normal. There is deviation of the nasal septum to the left.  IMPRESSION: No acute intracranial findings.  Chronic ischemic microvascular disease in a few small old bilateral central lacunar infarcts.   Electronically Signed   By: Marin Olp M.D.   On: 09/30/2014 14:26   Mr Jodene Nam Head Wo Contrast  09/30/2014   CLINICAL DATA:  Speech difficulty beginning last night, associated with LEFT-sided weakness. History of hypertension, hyperlipidemia, chronic renal failure, diabetes, atrial fibrillation, not on anti coagulation.  EXAM: MRI HEAD WITHOUT CONTRAST  MRA HEAD WITHOUT CONTRAST  TECHNIQUE: Multiplanar, multiecho pulse sequences of the brain and surrounding structures were obtained without intravenous contrast. Angiographic images of the head were obtained using MRA technique without contrast.  COMPARISON:  CT of the head Sep 30, 2014 at 1408 hours  FINDINGS: MRI HEAD FINDINGS  Moderately motion degraded examination.  Patchy reduced diffusion within a 3 x 1.7 cm distribution with low ADC values in RIGHT frontal lobe. No susceptibility artifact to suggest hemorrhage.  The ventricles and sulci are normal for patient's age. Patchy supratentorial white matter T2 hyperintensities may be underestimated due to motion. No midline shift or mass effect. No definite mass lesions though motion degrades sensitivity for subtle parenchymal signal abnormality. Remote RIGHT basal ganglia, bilateral thalamus lacunar infarcts.  No abnormal extra-axial fluid collections. Status post bilateral ocular lens implants. Mild paranasal sinus mucosal thickening with atretic LEFT maxillary sinus most consistent with chronic  sinusitis. The mastoid air cells appear well aerated.  No abnormal sellar expansion. No cerebellar tonsillar ectopia. No suspicious calvarial bone marrow signal. Patient appears edentulous.  MRA HEAD FINDINGS  Moderately motion degraded examination limits the sensitivity for luminal irregularity or low grade stenosis. Motion results in duplicated appearance of the vessels at the circle of Willis.  Flow related enhancement within the cervical, petrous, cavernous and supra clinoid internal carotid arteries. Patent anterior communicating artery with flow related enhancement within the anterior and middle cerebral arteries including more distal segments.  Flow related enhancement within the bilateral vertebral arteries, basilar artery, which demonstrates duplicated appearance due to motion. Normal flow related enhancement of  the posterior cerebral arteries, including distal segments.  No large vessel occlusion.  IMPRESSION: MRI HEAD: Moderately motion degraded examination. Patchy areas of acute ischemia RIGHT frontal lobe, MCA territory.  Involutional changes. At least mild white matter changes likely represent chronic small vessel ischemic disease. Remote bilateral thalamus and RIGHT basal ganglia lacunar infarct.  MRA HEAD: Moderately motion degraded examination without large vessel occlusion or definite high-grade stenosis though limited assessment at the circle of Willis.   Electronically Signed   By: Elon Alas   On: 09/30/2014 23:03   Mri Brain Without Contrast  09/30/2014   CLINICAL DATA:  Speech difficulty beginning last night, associated with LEFT-sided weakness. History of hypertension, hyperlipidemia, chronic renal failure, diabetes, atrial fibrillation, not on anti coagulation.  EXAM: MRI HEAD WITHOUT CONTRAST  MRA HEAD WITHOUT CONTRAST  TECHNIQUE: Multiplanar, multiecho pulse sequences of the brain and surrounding structures were obtained without intravenous contrast. Angiographic images of the  head were obtained using MRA technique without contrast.  COMPARISON:  CT of the head Sep 30, 2014 at 1408 hours  FINDINGS: MRI HEAD FINDINGS  Moderately motion degraded examination.  Patchy reduced diffusion within a 3 x 1.7 cm distribution with low ADC values in RIGHT frontal lobe. No susceptibility artifact to suggest hemorrhage.  The ventricles and sulci are normal for patient's age. Patchy supratentorial white matter T2 hyperintensities may be underestimated due to motion. No midline shift or mass effect. No definite mass lesions though motion degrades sensitivity for subtle parenchymal signal abnormality. Remote RIGHT basal ganglia, bilateral thalamus lacunar infarcts.  No abnormal extra-axial fluid collections. Status post bilateral ocular lens implants. Mild paranasal sinus mucosal thickening with atretic LEFT maxillary sinus most consistent with chronic sinusitis. The mastoid air cells appear well aerated.  No abnormal sellar expansion. No cerebellar tonsillar ectopia. No suspicious calvarial bone marrow signal. Patient appears edentulous.  MRA HEAD FINDINGS  Moderately motion degraded examination limits the sensitivity for luminal irregularity or low grade stenosis. Motion results in duplicated appearance of the vessels at the circle of Willis.  Flow related enhancement within the cervical, petrous, cavernous and supra clinoid internal carotid arteries. Patent anterior communicating artery with flow related enhancement within the anterior and middle cerebral arteries including more distal segments.  Flow related enhancement within the bilateral vertebral arteries, basilar artery, which demonstrates duplicated appearance due to motion. Normal flow related enhancement of the posterior cerebral arteries, including distal segments.  No large vessel occlusion.  IMPRESSION: MRI HEAD: Moderately motion degraded examination. Patchy areas of acute ischemia RIGHT frontal lobe, MCA territory.  Involutional changes.  At least mild white matter changes likely represent chronic small vessel ischemic disease. Remote bilateral thalamus and RIGHT basal ganglia lacunar infarct.  MRA HEAD: Moderately motion degraded examination without large vessel occlusion or definite high-grade stenosis though limited assessment at the circle of Willis.   Electronically Signed   By: Elon Alas   On: 09/30/2014 23:03    2D Echo:  Study Conclusions  - Left ventricle: The cavity size was normal. There was mild concentric hypertrophy. Systolic function was moderately reduced. The estimated ejection fraction was in the range of 35% to 40%. Hypokinesis of the mid-apicalanteroseptal, anterior, inferior, and apical myocardium. The study is not technically sufficient to allow evaluation of LV diastolic function. - Aortic valve: There was mild stenosis. There was mild regurgitation. Valve area (VTI): 1.65 cm^2. Valve area (Vmax): 1.53 cm^2. Valve area (Vmean): 1.46 cm^2. - Mitral valve: Valve area by continuity equation (using LVOT flow):  2.01 cm^2. - Left atrium: The atrium was mildly dilated. - Atrial septum: Echo contrast study showed no shunt. - Tricuspid valve: There was mild-moderate regurgitation directed centrally. - Pulmonary arteries: Systolic pressure was moderately increased. PA peak pressure: 50 mm Hg (S).  Transthoracic echocardiography. M-mode, complete 2D, spectral Doppler, and color Doppler. Birthdate: Patient birthdate: 06/15/1934. Age: Patient is 80 yr old. Sex: Gender: female. BMI: 30.2 kg/m^2. Blood pressure:   136/62 Patient status: Inpatient. Study date: Study date: 10/02/2014. Study time: 08:01 AM. Location: Echo laboratory.  Admission HPI: Ms Sparr is a 80 year old woman with atrial fibrillation not on anti-coagulation, HTN, DM2, hypothyroidism, hyperlipidemia presents with slurred speech. She was on the phone with a neighbor yesterday when she said she was  having trouble speaking. She also felt like she was drooling from the right side of her mouth.This morning she reports woke up with stuttering and continued drooling from the R mouth. This has gradually improved over the course of the day. Her family thinks that she is having difficulty expressing herself and her speech is off. Ms Cheramie also reports that since yesterday she has felt weak in both of her arms/hands with some difficulty using her walker. She lives alone. She otherwise had no complaints.  Hospital Course by problem list:   CVA (cerebral vascular accident): On admission, Ms. Rials showed some questionable facial droop and expressive aphasia. Head CT showed no acute intracranial findings but was significant for chronic ischemic microvascular disease with a few small old bilateral central lacunar infarcts. Neurology was consulted. She underwent full stroke work up. Likely cause of her infarct is embolic as Ms. Klenke has atrial fibrillation not on anti-coagulation despite CHADSVasc score of 7. Her lisinopril was held to allow for permissive hypertension in the setting of acute stroke. MRI was significant for patchy areas of acute ischemia in the R frontal lobe (MCA territory) and remote bilateral thalamus and R basal ganglia lacunar infarct. MRA showed no large vessel occlusion or definite high-grade stenosis. Lipid panel and UDS unremarkable. She was switched from her home statin to high intensity statin, atorvastatin 40mg  daily. Patient's last HbA1c was 6.6 (07/2014). Echo with LV EF 35-40%, hypokinesis of the mid-apicalanteroseptal, anterior, inferior,and apical myocardium. Prelim carotid dopplers 1-39% bilateral. SLP recommend DYS 2 diet and PT/OT recommended SNF. Patient was started on eliquis 5mg  BID on 05/16. Patient was discharged with Aspirin 81mg  daily and eliquis 5mg  BID. Patient will also restart home medication of Lisinopril, glipizide, and Januvia, and continue Lopressor on  discharge.  Discharge Vitals:   BP 145/78 mmHg  Pulse 88  Temp(Src) 98.4 F (36.9 C) (Oral)  Resp 16  Ht 5\' 6"  (1.676 m)  Wt 181 lb 14.1 oz (82.5 kg)  BMI 29.37 kg/m2  SpO2 99%  Discharge Labs:  Results for orders placed or performed during the hospital encounter of 09/30/14 (from the past 24 hour(s))  Troponin I (q 6hr x 3)     Status: Abnormal   Collection Time: 10/01/14  1:43 PM  Result Value Ref Range   Troponin I 0.06 (H) <0.031 ng/mL  Glucose, capillary     Status: Abnormal   Collection Time: 10/01/14  4:18 PM  Result Value Ref Range   Glucose-Capillary 146 (H) 65 - 99 mg/dL  Glucose, capillary     Status: Abnormal   Collection Time: 10/01/14  9:42 PM  Result Value Ref Range   Glucose-Capillary 171 (H) 65 - 99 mg/dL  Glucose, capillary  Status: Abnormal   Collection Time: 10/01/14 11:57 PM  Result Value Ref Range   Glucose-Capillary 100 (H) 65 - 99 mg/dL  CBC     Status: Abnormal   Collection Time: 10/02/14  3:49 AM  Result Value Ref Range   WBC 7.1 4.0 - 10.5 K/uL   RBC 3.49 (L) 3.87 - 5.11 MIL/uL   Hemoglobin 9.7 (L) 12.0 - 15.0 g/dL   HCT 30.4 (L) 36.0 - 46.0 %   MCV 87.1 78.0 - 100.0 fL   MCH 27.8 26.0 - 34.0 pg   MCHC 31.9 30.0 - 36.0 g/dL   RDW 15.4 11.5 - 15.5 %   Platelets 121 (L) 150 - 400 K/uL  Basic metabolic panel     Status: Abnormal   Collection Time: 10/02/14  3:49 AM  Result Value Ref Range   Sodium 138 135 - 145 mmol/L   Potassium 4.6 3.5 - 5.1 mmol/L   Chloride 107 101 - 111 mmol/L   CO2 22 22 - 32 mmol/L   Glucose, Bld 94 65 - 99 mg/dL   BUN 40 (H) 6 - 20 mg/dL   Creatinine, Ser 2.29 (H) 0.44 - 1.00 mg/dL   Calcium 8.5 (L) 8.9 - 10.3 mg/dL   GFR calc non Af Amer 19 (L) >60 mL/min   GFR calc Af Amer 23 (L) >60 mL/min   Anion gap 9 5 - 15  Glucose, capillary     Status: None   Collection Time: 10/02/14  6:39 AM  Result Value Ref Range   Glucose-Capillary 97 65 - 99 mg/dL  Glucose, capillary     Status: Abnormal   Collection Time:  10/02/14 11:43 AM  Result Value Ref Range   Glucose-Capillary 155 (H) 65 - 99 mg/dL    Signed: Milagros Loll, MD 10/02/2014, 12:57 PM    Services Ordered on Discharge: None Equipment Ordered on Discharge: None

## 2014-10-02 NOTE — Progress Notes (Signed)
Occupational Therapy Re-Evaluation Patient Details Name: [name] MRN: [id number] DOB: 1935/03/23 Today's Date: 10/02/2014    History of Present Illness [name] is a 80 y.o. female with a history of hypertension, hyperlipidemia, chronic renal insufficiency, diabetes mellitus, atrial fibrillation on anticoagulation in the past which was stopped by the patient secondary to bruising. Admitted for difficulty with speech and left sided weakness. MRI indicates Patchy areas of acute ischemia RIGHT frontal lobe, MCA territory, and Remote bilateral thalamus and RIGHT basal ganglia lacunar infarct.   Clinical Impression   Per daughter, pt does not have 24/7 /s, which is what pt would need in order to D/C home. Pt with residual RUE weakness and requires min A with functional mobility. Pt will benefit from rehab at SNF in order to maximize functional level of independence and facilitate return to PLOF. Will follow acutely.     Follow Up Recommendations  SNF;Supervision/Assistance - 24 hour    Equipment Recommendations  None recommended by OT    Recommendations for Other Services       Precautions / Restrictions Precautions Precautions: Fall Restrictions Weight Bearing Restrictions: No      Mobility Bed Mobility Overal bed mobility: Needs Assistance Bed Mobility: Supine to Sit;Sit to Supine     Supine to sit: Min assist Sit to supine: Min assist     Transfers Overall transfer level: Needs assistance Equipment used: Rolling walker (2 wheeled) Transfers: Sit to/from [facility] Sit to Stand: Min assist Stand pivot transfers: Min guard       General transfer comment: Pt unable to get out of chair. Required vc to scoot hips forwar to edge of chair before trying to stand. Risk for falls.    Balance Overall balance assessment: Needs assistance   Sitting balance-Leahy Scale: Good     Standing balance support: During functional activity;Bilateral upper  extremity supported Standing balance-Leahy Scale: Poor                              ADL               Lower Body Bathing: Minimal assistance;Sit to/from stand   Upper Body Dressing : Set up   Lower Body Dressing: Minimal assistance   Toilet Transfer: Minimal assistance   Toileting- Clothing Manipulation and Hygiene: Minimal assistance;Sit to/from stand Toileting - Clothing Manipulation Details (indicate cue type and reason): Pt reaching with L hand to assist with care. "clumsy hand". Pt does not appear to realize L hand not picking up items.     Functional mobility during ADLs: Minimal assistance       Vision Vision Assessment?: No apparent visual deficits   Perception Perception Spatial deficits: Pt running the RW into table support on the floor.? inttention? decrerased awareness?   Praxis Praxis Praxis tested?: Within functional limits    Pertinent Vitals/Pain Pain Assessment: No/denies pain     Hand Dominance Right   Extremity/Trunk Assessment Upper Extremity Assessment Upper Extremity Assessment: LUE deficits/detail LUE Deficits / Details: LUE AROM is overall WFL. Strength @ 3+/5 throughout. Appears weakner distally. Intact isolated joint movement and gross grasp/relese. Inhand manipulation skills are difficult. ? mild L inattention LUE Coordination: decreased fine motor   Lower Extremity Assessment Lower Extremity Assessment: Defer to PT evaluation   Cervical / Trunk Assessment Cervical / Trunk Assessment: Kyphotic   Communication Communication Communication: Expressive difficulties   Cognition Arousal/Alertness: Lethargic Behavior During Therapy: [facility] for tasks  assessed/performed Overall Cognitive Status: History of cognitive impairments - at baseline (per daughter)                     General Comments       Exercises Exercises: Other exercises Other Exercises Other Exercises: LUE general strengthening. Encouraged pt to use  L hand as much as possible.    Shoulder Instructions      Home Living Family/patient expects to be discharged to:: Private residence Living Arrangements: Alone Available Help at Discharge: Personal care attendant Type of Home: Independent living facility Home Access: Level entry     Home Layout: One level     Bathroom Shower/Tub: Tub/shower unit;Curtain Shower/tub characteristics: Architectural technologist: Standard Bathroom Accessibility: Yes How Accessible: Accessible via walker Home Equipment: [place] - 4 wheels;Toilet riser;Tub bench          Prior Functioning/Environment Level of Independence: Needs assistance  Gait / Transfers Assistance Needed: Uses rollator when ambulating at all times ADL's / Homemaking Assistance Needed: daughter states that her motheris uaually able to take herself to the bathroom and is mod I with toileting and does her basic ADL when aide is not around.        OT Diagnosis: Generalized weakness   OT Problem List: Decreased strength;Decreased range of motion;Decreased activity tolerance;Impaired balance (sitting and/or standing);Decreased coordination;Decreased safety awareness;Impaired UE functional use   OT Treatment/Interventions: Self-care/ADL training;Therapeutic exercise;Neuromuscular education;DME and/or AE instruction;Therapeutic activities;Patient/family education;Balance training    OT Goals(Current goals can be found in the care plan section) Acute Rehab OT Goals Patient Stated Goal: to get stronger OT Goal Formulation: With patient Time For Goal Achievement: 10/16/14 Potential to Achieve Goals: Good  OT Frequency: Min 2X/week   Barriers to D/C: Decreased caregiver support (pt does not have 24/7 /s)          Co-evaluation              End of Session Equipment Utilized During Treatment: Surveyor, mining Communication: Mobility status  Activity Tolerance: Patient tolerated treatment well Patient left: in chair;with  call bell/phone within reach;with chair alarm set;with family/visitor present   Time: 1445-1510 OT Time Calculation (min): 25 min Charges:  OT General Charges $OT Visit: 1 Procedure OT Evaluation $OT Re-eval: 1 Procedure OT Treatments $Self Care/Home Management : 8-22 mins G-Codes:    Hollye Pritt,HILLARY Oct 13, 2014, 3:36 PM   [place], OTR/L  [phone] 10/13/14

## 2014-10-02 NOTE — Progress Notes (Signed)
  PROGRESS NOTE MEDICINE TEACHING ATTENDING   Day 2 of stay Patient name: [name]   Medical record number: [id number] Date of birth: [birth year]   Patient has no new complaints.   Blood pressure 157/64, pulse 88, temperature 98.1 F (36.7 C), temperature source Oral, resp. rate 20, SpO2 100 %. The patient is alert and oriented, comfortable, in no acute distress. PERRL, EOMI. Heart exhibits irregular rate and rhythm, no murmurs. Lungs are clear to auscultation. Abdomen is soft and non-tender. 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 Wound at the bottom of right forefoot. There are no gross focal neurological deficits apparent.   Assessment/Plan 80 year old lady with history of atrial fibrillation admitted with recent frontal stroke. Patient on Aspirin and Eliquis. Afib currently rate controlled, no overnight events reported. Imaging reviewed. Neurology recommendations appreciated. Disposition to be determined today - SNF or Home health with PT. Please see the resident note for details of plan of care. I have discussed the care of this patient with my IM team residents. [place], [place] 10/02/2014, 10:50 AM.

## 2014-10-02 NOTE — Clinical Social Work Note (Signed)
Clinical Social Work Assessment  Patient Details  Name: [name] MRN: [id number] Date of Birth: Jun 03, 1934  Date of referral:  10/02/14               Reason for consult:  Facility Placement, Discharge Planning                Permission sought to share information with:  Case Manager, Facility Sport and exercise psychologist, Family Supports Permission granted to share information::  Yes, Verbal Permission Granted  Name::      [name] )  Agency::   (YES )  Relationship::   (Daughter )  Contact Information:   [phone])  Housing/Transportation Living arrangements for the past 2 months:  [place] of Information:  Adult Children Patient Interpreter Needed:  None Criminal Activity/Legal Involvement Pertinent to Current Situation/Hospitalization:  No - Comment as needed Significant Relationships:  Adult Children Lives with:  Facility Resident Do you feel safe going back to the place where you live?  Yes Need for family participation in patient care:  Yes (Comment)  Care giving concerns:  No care giving concerns reported by family.   Social Worker assessment / plan:  Holiday representative notified by PT/OT that patient is now requiring SNF placement. CSW met with patient and pt's dtr, [name] present at bedside in reference to post-acute placement for SNF. CSW introduced CSW role and SNF process. PT initially recommended Home Health services however pt has declined with mobility and requiring SNF placement at this time. Pt's dtr reported pt is a resident at [facility] and plans to return once appropriate for ALF. Pt's dtr further reported pt has been a short-term resident of [facility] in the past as well. Pt is resting during assessment however stated she is agreeable to SNF placement for rehab. Pt's dtr also agreeable to SNF placement and requesting placement at [facility] and [place] which is walking distance from dtr's home. Pt's dtr chooses  [facility] as a second option. No further concerns reported at this time. CSW will continue to follow pt and pt's family for continued support and to facilitate pt's discharge needs once medically stable.   FL-2 completed, faxed and placed on chart for MD signature.    Employment status:  Retired Nurse, adult PT Recommendations:  Mobile, Home with [place] / Referral to community resources:  [place]  Patient/Family's Response to care:  Pt oriented X4 and sitting up at bedside resting during assessment. Pt's dtr supportive and involved in pt's care stating she would like pt placed at [facility]. Pt's second choice is [facility] if [facility] is unavailable. Pt and pt's dtr pleasant and agreeable to SNF placement.   Patient/Family's Understanding of and Emotional Response to Diagnosis, Current Treatment, and Prognosis:  Pt's dtr reported understanding of stroke diagnosis and current stroke work up/ treatments. Pt's dtr supportive of SNF placement and involved in pt's care plans.    Emotional Assessment Appearance:  Appears stated age Attitude/Demeanor/Rapport:   (Pleasant ) Affect (typically observed):  Accepting, Appropriate, Pleasant Orientation:  Oriented to Self, Oriented to Place, Oriented to  Time, Oriented to Situation Alcohol / Substance use:  Never Used Psych involvement (Current and /or in the community):  No (Comment)  Discharge Needs  Concerns to be addressed:  No discharge needs identified Readmission within the last 30 days:  No Current discharge risk:  None Barriers to Discharge:  No Barriers Identified  Glendon Axe, MSW, LCSWA [phone] 10/02/2014 3:10 PM

## 2014-10-02 NOTE — Progress Notes (Signed)
  Echocardiogram 2D Echocardiogram has been performed.  [name] 10/02/2014, 8:36 AM

## 2014-10-02 NOTE — Consult Note (Addendum)
WOC wound consult note Reason for Consult: Consult requested for bilat feet.  Wound type: Right plantar foot with full thickness wound, .2X.2X.2cm, red dry woundbed, no odor or drainage. Right anterior outer foot with dry callous, no open wound or drainage. Right outer foot with full thickness wound, .8X.8X.2cm; appears to be previous dry calloused area which has cracked. No odor or drainage, dry red woundbed. Dressing procedure/placement/frequency: Bactroban to both sites to provide antimicrobial benefits and promote moist healing.  Discussed plan of care with patient and she verbalizes understanding. Please re-consult if further assistance is needed.  Thank-you,  Julien Girt MSN, [place], [place], [place], [place]

## 2014-10-03 ENCOUNTER — Telehealth: Payer: Self-pay | Admitting: *Deleted

## 2014-10-03 LAB — GLUCOSE, CAPILLARY
Glucose-Capillary: 124 mg/dL — ABNORMAL HIGH (ref 65–99)
Glucose-Capillary: 166 mg/dL — ABNORMAL HIGH (ref 65–99)

## 2014-10-03 NOTE — Progress Notes (Signed)
Speech Language Pathology Treatment: Dysphagia  Patient Details Name: [name] MRN: [id number] DOB: June 26, 1934 Today's Date: 10/03/2014 Time: 0945-1000 SLP Time Calculation (min) (ACUTE ONLY): 15 min  Assessment / Plan / Recommendation Clinical Impression  Session focused on speech-language-cognitive assessment (see other note) and tolerance with po's. Pt with mild-moderate amount of egg residue, primarily left buccal pocket. She required moderate visual and verbal cueing to remove. Dentures removed, cleaned and donned at end of session. Recommend no straws due to immediate cough following straw sip water. Cup sips assist in decreased volume and velocity. Recommend continuing thin liquids, no straws and Dys 2 diet texture. ST will continue intervention.   HPI Other Pertinent Information: 80 year old woman with history of atrial fibrillation, HTN, DM2, hypothyroidism, chronic renal insufficiency, hyperlipidemia presents with slurred speech. MRI Patchy areas of acute ischemia RIGHT frontal lobe, MCA territory, Remote bilateral acute ischemia RIGHT frontal lobe, MCA territory.   Pertinent Vitals Pain Assessment: No/denies pain  SLP Plan  Continue with current plan of care    Recommendations Diet recommendations: Dysphagia 2 (fine chop);Thin liquid Liquids provided via: Cup;No straw Medication Administration: Whole meds with liquid Supervision: Patient able to self feed;Full supervision/cueing for compensatory strategies Compensations: Slow rate;Small sips/bites;Follow solids with liquid Postural Changes and/or Swallow Maneuvers: Seated upright 90 degrees              Oral Care Recommendations: Oral care BID Follow up Recommendations: Skilled Nursing facility Plan: Continue with current plan of care    GO     [place] Siren 10/03/2014, 10:24 AM  Orbie Pyo Colvin Caroli[employer] [phone]

## 2014-10-03 NOTE — Clinical Social Work Note (Signed)
Clinical Social Worker facilitated patient discharge including contacting patient family and facility to confirm patient discharge plans.  Clinical information faxed to facility and family agreeable with plan.  CSW arranged ambulance transport via [facility] to [facility].  RN to call report prior to discharge.  DC packet prepared and on chart for transport with number to call report.  Clinical Social Worker will sign off for now as social work intervention is no longer needed. Please consult [place] again if new need arises.  Glendon Axe, MSW, LCSWA [phone] 10/03/2014 10:57 AM

## 2014-10-03 NOTE — Progress Notes (Signed)
Patient discharged to [facility]. Family aware. Family took patient's possessions. Neuro assessment unchanged, patient denies pain. IV removed, tele removed, foam dressings on feet replaced prior to discharge. Patient discharge packet given to transporters.

## 2014-10-03 NOTE — Progress Notes (Signed)
Report called and given to Santiago Glad at [facility]. Family aware per social work of patient's discharge this afternoon

## 2014-10-03 NOTE — Clinical Social Work Placement (Signed)
   CLINICAL SOCIAL WORK PLACEMENT  NOTE  Date:  10/03/2014  Patient Details  Name: [name] MRN: [id number] Date of Birth: [birth year]  Clinical Social Work is seeking post-discharge placement for this patient at the [place] level of care (*CSW will initial, date and re-position this form in  chart as items are completed):  Yes   Patient/family provided with [place] Work Department's list of facilities offering this level of care within the geographic area requested by the patient (or if unable, by the patient's family).  Yes   Patient/family informed of their freedom to choose among providers that offer the needed level of care, that participate in Medicare, Medicaid or managed care program needed by the patient, have an available bed and are willing to accept the patient.  Yes   Patient/family informed of [facility]'s ownership interest in [facility] and [facility], as well as of the fact that they are under no obligation to receive care at these facilities.  PASRR submitted to EDS on 10/02/14     PASRR number received on 10/02/14     Existing PASRR number confirmed on       FL2 transmitted to all facilities in geographic area requested by pt/family on 10/02/14     FL2 transmitted to all facilities within larger geographic area on 10/02/14     Patient informed that his/her managed care company has contracts with or will negotiate with certain facilities, including the following:   (YES)     Yes   Patient/family informed of bed offers received.  Patient chooses bed at  [facility] and [place] )     Physician recommends and patient chooses bed at      Patient to be transferred to  [facility] [place]) on 10/03/14.  Patient to be transferred to facility by  [facility])     Patient family notified on 10/03/14 of transfer.  Name of family member notified:   (Pt's dtr, [name])     PHYSICIAN Please sign  FL2     Additional Comment:    _______________________________________________ Rozell Searing, LCSW 10/03/2014, 12:22 PM

## 2014-10-03 NOTE — Progress Notes (Signed)
  PROGRESS NOTE MEDICINE TEACHING ATTENDING   Day 3 of stay Patient name: [name]   Medical record number: [id number] Date of birth: March 27, 1935   Patient doing well, no new complaints.  Blood pressure 135/58, pulse 90, temperature 98.1 F (36.7 C), temperature source Oral, resp. rate 16, height 5\' 6"  (1.676 m), weight 181 lb 14.1 oz (82.5 kg), SpO2 100 %. The patient is alert and oriented, comfortable, in no acute distress. PERRL, EOMI. Heart exhibits regular rate and rhythm, no murmurs. Lungs are clear to auscultation. Abdomen is soft and non-tender. There is no pedal edema and good pedal pulses. Mild weakness in motor strength of left upper extremity but no other gross focal neurological deficits apparent.   Assessment/Plan Acute ischemia in the R frontal lobe (MCA territory) on MRI as well as remote bilateral thalamus and right basal ganglia lacunar infarct. MRA without large vessel occlusion or definite high-grade stenosis. SLP recommend dys diet. And preliminary carotid dopplers negative for significant stroke risk factor related findings.Neurology recommendations appreciated, anticoagulation for atrial fibrillation to continue. PT OT recommendations for SNF. Family in agreement. Patient to be discharged today. I have discussed the care of this patient with my IM team residents. Please see the resident note for details.  [place], [place] 10/03/2014, 1:03 PM.

## 2014-10-03 NOTE — Progress Notes (Signed)
Subjective: No acute events this morning. She reports she is doing well. She denies CP/SOB. Reports her strength is improving.  Objective: Vital signs in last 24 hours: Filed Vitals:   10/02/14 1804 10/02/14 2150 10/03/14 0240 10/03/14 0700  BP: 137/68 172/79 152/64 145/78  Pulse: 82 89 62 88  Temp: 98.3 F (36.8 C) 98.1 F (36.7 C) 98.8 F (37.1 C) 98.4 F (36.9 C)  TempSrc: Oral Oral Oral Oral  Resp: 20 18 18 16   Height:      Weight:      SpO2: 100% 98% 98% 99%   Weight change:  No intake or output data in the 24 hours ending 10/03/14 0834 Gen: No acute distress HEENT: Atraumatic, PERRL, EOMI, sclerae anicteric, moist mucous membranes Heart: irregularly irregular, normal S1 S2, no murmurs, rubs, or gallops Lungs: Clear to auscultation bilaterally, respirations unlabored Abd: Soft, non-tender, non-distended Extremities: b/l toe amputations well healed Neuro: A&O x 4, CN II-XII intact, finger-nose-finger coordination intact, L grip 4/5 otherwise strength 5/5 and symmetric in all extremities, sensation grossly intact   Lab Results: Basic Metabolic Panel:  Recent Labs Lab 10/01/14 0144 10/02/14 0349  NA 139 138  K 4.5 4.6  CL 107 107  CO2 22 22  GLUCOSE 106* 94  BUN 40* 40*  CREATININE 2.23* 2.29*  CALCIUM 8.9 8.5*   Liver Function Tests:  Recent Labs Lab 09/30/14 1330  AST 18  ALT 11*  ALKPHOS 77  BILITOT 0.6  PROT 7.5  ALBUMIN 3.8   CBC:  Recent Labs Lab 10/01/14 0144 10/02/14 0349  WBC 7.0 7.1  HGB 10.0* 9.7*  HCT 31.8* 30.4*  MCV 86.4 87.1  PLT 123* 121*   Cardiac Enzymes:  Recent Labs Lab 10/01/14 0144 10/01/14 0909 10/01/14 1343  TROPONINI 0.06* 0.06* 0.06*   CBG:  Recent Labs Lab 10/01/14 2357 10/02/14 0639 10/02/14 1143 10/02/14 1622 10/02/14 2142 10/03/14 0652  GLUCAP 100* 97 155* 169* 124* 124*   Fasting Lipid Panel:  Recent Labs Lab 10/01/14 0144  CHOL 180  HDL 81  LDLCALC 91  TRIG 41  CHOLHDL 2.2    Coagulation:  Recent Labs Lab 09/30/14 1330  LABPROT 15.7*  INR 1.23   Studies/Results: No results found. Medications: I have reviewed the patient's current medications. Scheduled Meds: .  stroke: mapping our early stages of recovery book   Does not apply Once  . apixaban  5 mg Oral BID  . aspirin EC  81 mg Oral Daily  . atorvastatin  40 mg Oral q1800  . insulin aspart  0-5 Units Subcutaneous QHS  . insulin aspart  0-9 Units Subcutaneous TID WC  . levothyroxine  112 mcg Oral QAC breakfast  . metoprolol  25 mg Oral BID  . mupirocin cream   Topical Daily  . pantoprazole  40 mg Oral Daily   Continuous Infusions:  PRN Meds:. Assessment/Plan:  CVA: Acute ischemia in the R frontal lobe (MCA territory) on MRI as well as remote bilateral thalamus and R basal ganglia lacunar infarct. MRA without large vessel occlusion or definite high-grade stenosis. SLP recomend DYS 2 diet. Echo with LV EF 35-40%, hypokinesis of the mid-apicalanteroseptal, anterior, inferior,and apical myocardium. Prelim carotid dopplers 1-39% bilateral. -appreciate neuro, SLP -ASA 81 mg po daily -Eliquis 5mg  BID -DYS 2 diet -Follow up with stroke clinic in 2 months  Atrial fibrillation: She is on lopressor 25 mg bid at home.  -Eliquis 5mg  BID -cont lopressor 25 mg bid to prevent RVR  HTN:  At home she is on lisinopril 40 mg daily, lopressor 25 mg bid. -hold lisinopril in setting of permissive HTN -cont lopressor to prevent atrial fibrillation with RVR  DM2: Last hemoglobin A1c 08/02/14 was 6.6. At home she is on glipizide 10 mg bid, januvia 25 mg daily which were held.  -hold glipizide, januvia -SSI sensitive  Hypothyroidism: Cont home synthroid 112 mcg daily  Hyperlipidemia: Continue atorvastatin 40 mg daily  CKD stage 4: cont to monitor  Anemia of Chronic Disease: Stable. Cont to monitor  FEN: DYS 2  VTE PPx: heparin 5000 u [place] tid  Dispo: Likely d/c to SNF today  The patient does have a  current PCP [name], MD) and does need an [facility] hospital follow-up appointment after discharge.  The patient does not know have transportation limitations that hinder transportation to clinic appointments.  .Services Needed at time of discharge: Y = Yes, Blank = No PT:   OT:   RN:   Equipment:   Other:     LOS: 3 days   Milagros Loll, MD 10/03/2014, 8:34 AM

## 2014-10-03 NOTE — Evaluation (Signed)
Clinical/Bedside Swallow Evaluation Patient Details  Name: [name] MRN: [id number] Date of Birth: 1935-01-02  Today's Date: 10/03/2014 Time: SLP Start Time (ACUTE ONLY): 0933 SLP Stop Time (ACUTE ONLY): 0945 SLP Time Calculation (min) (ACUTE ONLY): 12 min  Past Medical History:  Past Medical History  Diagnosis Date  . Hypertension   . Hyperlipidemia   . Chronic venous insufficiency   . Osteomyelitis     s/p Rt 2nd toe and left 5 toes  amputation in 1/12  by Dr. Sharol Given  . Diabetes mellitus   . Anemia     b12 def, iron def, follow at cancer center, gets B12 and  another injection there. Could not remeber the name. Dr. Ralene Ok is her  cancer doctor  . Thyroid disease     hypothyroidism h/o hyperthyroidism s/p ablation/ectomy  . Asthma   . Dysrhythmia     atrial fibb  . Shortness of breath   . Blood transfusion     two or more yrs ago  . Hypothyroidism     thyroid removed 4 or more yrs ago  . Atrial fibrillation   . Chronic renal insufficiency    Past Surgical History:  Past Surgical History  Procedure Laterality Date  . Eye surgery      cat ext ou  . Appendectomy      teenager  . Amputation  04/30/2011    Procedure: AMPUTATION FOOT;  Surgeon: Newt Minion, MD;  Location: [place];  Service: Orthopedics;  Laterality: Right;  Right Midfoot Amputation   HPI:  80 year old woman with history of atrial fibrillation, HTN, DM2, hypothyroidism, chronic renal insufficiency, hyperlipidemia presents with slurred speech. MRI Patchy areas of acute ischemia RIGHT frontal lobe, MCA territory, Remote bilateral acute ischemia RIGHT frontal lobe, MCA territory.   Assessment / Plan / Recommendation Clinical Impression  Pt exhibits mild-moderate motor speech deficits with intermittent dysfluency, decreased vocal intensity and increased rate of speech mildly affecting fluency. Pt's responses on verbal cognition tasks were within functional limits including working memory. Pt reports she is  responsible for her medications using pill box and daughter handles finances. This therapist introduced speech strategies with examples for pt. ST will work with pt on speech precision and intelligibility and continue dysphagia while on acute care.    Aspiration Risk       Diet Recommendation          Other  Recommendations     Follow Up Recommendations  Skilled Nursing facility    Frequency and Duration min 2x/week      Pertinent Vitals/Pain none         Swallow Study Prior Functional Status  Cognitive/Linguistic Baseline:  (min-mild dementia) Type of Home: Independent living facility  Lives With: Alone Available Help at Discharge: Personal care attendant    General Other Pertinent Information: 80 year old woman with history of atrial fibrillation, HTN, DM2, hypothyroidism, chronic renal insufficiency, hyperlipidemia presents with slurred speech. MRI Patchy areas of acute ischemia RIGHT frontal lobe, MCA territory, Remote bilateral acute ischemia RIGHT frontal lobe, MCA territory.    Oral/Motor/Sensory Function Overall Oral Motor/Sensory Function: Impaired Labial ROM: Reduced left Labial Symmetry: Abnormal symmetry left Labial Strength: Reduced Lingual ROM: Within Functional Limits Lingual Symmetry: Within Functional Limits Facial Symmetry: Within Functional Limits Mandible: Within Functional Limits   Ice Chips     Thin Liquid      Nectar Thick     Honey Thick     Puree  Solid   GO            [place] Siren 10/03/2014,10:19 AM   Orbie Pyo Colvin Caroli[employer] [phone]

## 2014-10-03 NOTE — Telephone Encounter (Signed)
Received faxed confirmation from pt's insurance that Eliquis 5mg  tabs has been approved through 10/02/2015.Despina Hidden Cassady5/17/20162:40 PM       Pt ID# [id number] Ref# [id number]

## 2014-10-04 ENCOUNTER — Encounter: Payer: Self-pay | Admitting: Registered Nurse

## 2014-10-04 ENCOUNTER — Non-Acute Institutional Stay (SKILLED_NURSING_FACILITY): Payer: Medicare Other | Admitting: Registered Nurse

## 2014-10-04 DIAGNOSIS — E785 Hyperlipidemia, unspecified: Secondary | ICD-10-CM

## 2014-10-04 DIAGNOSIS — E1140 Type 2 diabetes mellitus with diabetic neuropathy, unspecified: Secondary | ICD-10-CM

## 2014-10-04 DIAGNOSIS — I639 Cerebral infarction, unspecified: Secondary | ICD-10-CM

## 2014-10-04 DIAGNOSIS — K5901 Slow transit constipation: Secondary | ICD-10-CM

## 2014-10-04 DIAGNOSIS — G8194 Hemiplegia, unspecified affecting left nondominant side: Secondary | ICD-10-CM

## 2014-10-04 DIAGNOSIS — E039 Hypothyroidism, unspecified: Secondary | ICD-10-CM

## 2014-10-04 DIAGNOSIS — N184 Chronic kidney disease, stage 4 (severe): Secondary | ICD-10-CM

## 2014-10-04 DIAGNOSIS — D631 Anemia in chronic kidney disease: Secondary | ICD-10-CM

## 2014-10-04 DIAGNOSIS — I10 Essential (primary) hypertension: Secondary | ICD-10-CM

## 2014-10-04 DIAGNOSIS — I482 Chronic atrial fibrillation, unspecified: Secondary | ICD-10-CM

## 2014-10-04 DIAGNOSIS — I69921 Dysphasia following unspecified cerebrovascular disease: Secondary | ICD-10-CM | POA: Diagnosis not present

## 2014-10-04 DIAGNOSIS — G8190 Hemiplegia, unspecified affecting unspecified side: Secondary | ICD-10-CM | POA: Diagnosis not present

## 2014-10-04 DIAGNOSIS — I69391 Dysphagia following cerebral infarction: Secondary | ICD-10-CM

## 2014-10-04 DIAGNOSIS — E559 Vitamin D deficiency, unspecified: Secondary | ICD-10-CM

## 2014-10-04 DIAGNOSIS — E11621 Type 2 diabetes mellitus with foot ulcer: Secondary | ICD-10-CM

## 2014-10-04 DIAGNOSIS — I5020 Unspecified systolic (congestive) heart failure: Secondary | ICD-10-CM

## 2014-10-04 DIAGNOSIS — L97519 Non-pressure chronic ulcer of other part of right foot with unspecified severity: Secondary | ICD-10-CM

## 2014-10-04 NOTE — Progress Notes (Signed)
Patient ID: [name], female   DOB: [birth year], 80 y.o.   MRN: [id number]   Place of Service: [facility]  No Known Allergies  Code Status: Full Code  Goals of Care: Longevity/STR  Chief Complaint  Patient presents with  . Hospitalization Follow-up    HPI Reviewed of hospital record showed 80 y.o. female with PMH of atrial fibrillation, HTN, HLD, DM2, anemia, CKD4, hypothyroidism among others is being seen for a follow-up visit post hospital admission from 09/30/14 to 08/28/85 with acute embolic CVA. Head CT showed no acute intracranial findings but was significant for chronic ischemic microvascular disease with a few small old bilateral central lacunar infarcts. She underwent full stroke work out. MRI was significant for patchy areas of acute ischemia in the Right frontal lob and remote bilateral thalamus and R basal ganglia lacunar infarct. It was thought that her CVA is embolic in nature as she has atrial fibrillation but is not on anticoagulation despite CHASVASC score of 7. She is now here for short term rehab and her goal is to return home. Seen in room today. Reported having mild constipation and problem with pocketing food. Denies any other concerns. Family at bedside.   Review of Systems Constitutional: Negative for fever and chills. HENT: Negative for ear pain, congestion, and sore throat Eyes: Negative for eye pain, eye discharge, and visual disturbance  Cardiovascular: Negative for chest pain, palpitations, and leg swelling Respiratory: Negative cough, shortness of breath, and wheezing.  Gastrointestinal: Negative for nausea and vomiting. Negative for abdominal pain and diarrhea. Positive for constipapation Genitourinary: Negative for dysuria Musculoskeletal: Negative for uncontrolled pain Neurological: Negative for dizziness and headache  Skin: Negative for rash and. Positive for wound on feet. Has all toes amputated.  Psychiatric: Negative for depression and  anxiety.    Past Medical History  Diagnosis Date  . Hypertension   . Hyperlipidemia   . Chronic venous insufficiency   . Osteomyelitis     s/p Rt 2nd toe and left 5 toes  amputation in 1/12  by Dr. Sharol Given  . Diabetes mellitus   . Anemia     b12 def, iron def, follow at cancer center, gets B12 and  another injection there. Could not remeber the name. Dr. Ralene Ok is her  cancer doctor  . Thyroid disease     hypothyroidism h/o hyperthyroidism s/p ablation/ectomy  . Asthma   . Dysrhythmia     atrial fibb  . Shortness of breath   . Blood transfusion     two or more yrs ago  . Hypothyroidism     thyroid removed 4 or more yrs ago  . Atrial fibrillation   . Chronic renal insufficiency     Past Surgical History  Procedure Laterality Date  . Eye surgery      cat ext ou  . Appendectomy      teenager  . Amputation  04/30/2011    Procedure: AMPUTATION FOOT;  Surgeon: Newt Minion, MD;  Location: [place];  Service: Orthopedics;  Laterality: Right;  Right Midfoot Amputation    History  Substance Use Topics  . Smoking status: Never Smoker   . Smokeless tobacco: Never Used  . Alcohol Use: No    Family History  Problem Relation Age of Onset  . Anemia Mother   . HIV Brother   . Cancer Brother   . Colon cancer Mother     rectal      Medication List  This list is accurate as of: 10/04/14  1:50 PM.  Always use your most recent med list.               apixaban 5 MG Tabs tablet  Commonly known as:  ELIQUIS  Take 1 tablet (5 mg total) by mouth 2 (two) times daily.     aspirin 81 MG EC tablet  Take 1 tablet (81 mg total) by mouth daily.     atorvastatin 40 MG tablet  Commonly known as:  LIPITOR  Take 1 tablet (40 mg total) by mouth daily at 6 PM.     calcitRIOL 0.25 MCG capsule  Commonly known as:  ROCALTROL  Take 1 capsule by mouth every Monday, Wednesday, and Friday.     glipiZIDE 10 MG tablet  Commonly known as:  GLUCOTROL  Take 1 tablet (10 mg total) by  mouth 2 (two) times daily before a meal.     glucose blood test strip  Use as instructed     JANUVIA 25 MG tablet  Generic drug:  sitaGLIPtin  TAKE 1 TABLET BY MOUTH EVERY DAY     Lancets 30G Misc  Use to check blood sugars as directed     levothyroxine 112 MCG tablet  Commonly known as:  SYNTHROID, LEVOTHROID  Take 1 tablet (112 mcg total) by mouth daily before breakfast.     lisinopril 40 MG tablet  Commonly known as:  PRINIVIL,ZESTRIL  TAKE ONE TABLET BY MOUTH ONE TIME DAILY     metoprolol tartrate 25 MG tablet  Commonly known as:  LOPRESSOR  Take 1 tablet (25 mg total) by mouth 2 (two) times daily.     pantoprazole 40 MG tablet  Commonly known as:  PROTONIX  Take 1 tablet (40 mg total) by mouth daily.     Vitamin D3 10000 UNITS capsule  Take 1 capsule (10,000 Units total) by mouth once a week.        Physical Exam  BP 136/65 mmHg  Pulse 70  Temp(Src) 98 F (36.7 C)  Resp 16  Wt 179 lb (81.194 kg)  SpO2 98%  Constitutional: WDWN elderly female in no acute distress. Speech slow and slightly slurred HEENT: Normocephalic and atraumatic. PERRL. EOM intact. No scleral icterus. Oral mucosa moist. Posterior pharynx clear of any exudate or lesions.   Neck: Supple and nontender. No lymphadenopathy, masses, or thyromegaly. No JVD or carotid bruits. Cardiac: Normal S1, S2. Irregularly irregular without appreciable murmurs, rubs, or gallops. Distal pulses intact. trace dependent edema.  Respiratory: Unlabored respiration. Breath sounds clear bilaterally without rales, rhonchi, or wheezes. GI: Audible bowel sounds in all quadrants. Soft, nontender, nondistended.  Musculoskeletal: able to move all extremities. Generalized weakness (L>R). S/p all toes amputation bilaterally Skin: Warm and dry. 0.2cm open lesion noted on left lateral foot and 0.8x0.6x0.2cm diabetic ulcer on bottom of right foot. Wound bed moist and erythematous. No signs of infection. Noted.  Neurological:  Alert and oriented to self  Psychiatric:  Appropriate mood and affect.   Labs Reviewed  CBC Latest Ref Rng 10/02/2014 10/01/2014 09/30/2014  WBC 4.0 - 10.5 K/uL 7.1 7.0 7.9  Hemoglobin 12.0 - 15.0 g/dL 9.7(L) 10.0(L) 10.6(L)  Hematocrit 36.0 - 46.0 % 30.4(L) 31.8(L) 32.8(L)  Platelets 150 - 400 K/uL 121(L) 123(L) 129(L)    CMP Latest Ref Rng 10/02/2014 10/01/2014 09/30/2014  Glucose 65 - 99 mg/dL 94 106(H) 89  BUN 6 - 20 mg/dL 40(H) 40(H) 45(H)  Creatinine 0.44 - 1.00 mg/dL 2.29(H) 2.23(H)  2.39(H)  Sodium 135 - 145 mmol/L 138 139 138  Potassium 3.5 - 5.1 mmol/L 4.6 4.5 4.9  Chloride 101 - 111 mmol/L 107 107 108  CO2 22 - 32 mmol/L 22 22 19(L)  Calcium 8.9 - 10.3 mg/dL 8.5(L) 8.9 9.0  Total Protein 6.5 - 8.1 g/dL - - 7.5  Total Bilirubin 0.3 - 1.2 mg/dL - - 0.6  Alkaline Phos 38 - 126 U/L - - 77  AST 15 - 41 U/L - - 18  ALT 14 - 54 U/L - - 11(L)    Lab Results  Component Value Date   HGBA1C 6.6 08/02/2014    Lab Results  Component Value Date   TSH 0.472 12/07/2013    Lipid Panel     Component Value Date/Time   CHOL 180 10/01/2014 0144   TRIG 41 10/01/2014 0144   HDL 81 10/01/2014 0144   CHOLHDL 2.2 10/01/2014 0144   VLDL 8 10/01/2014 0144   [place] 91 10/01/2014 0144   Diagnostic Studies Reviewed 09/30/14: Head CT No acute intracranial findings.  Chronic ischemic microvascular disease in a few small old bilateral central lacunar infarcts.  09/29/13: Brain MRI/MRA MRI HEAD: Moderately motion degraded examination. Patchy areas of acute ischemia RIGHT frontal lobe, MCA territory.  Involutional changes. At least mild white matter changes likely represent chronic small vessel ischemic disease. Remote bilateral thalamus and RIGHT basal ganglia lacunar infarct.  MRA HEAD: Moderately motion degraded examination without large vessel occlusion or definite high-grade stenosis though limited assessment at the circle of Willis.  10/02/14: 2D Echo - Left ventricle: The  cavity size was normal. There was mild concentric hypertrophy. Systolic function was moderately reduced. The estimated ejection fraction was in the range of 35% to 40%. Hypokinesis of the mid-apicalanteroseptal, anterior, inferior, and apical myocardium. The study is not technically sufficient to allow evaluation of LV diastolic function. - Aortic valve: There was mild stenosis. There was mild regurgitation. Valve area (VTI): 1.65 cm^2. Valve area (Vmax): 1.53 cm^2. Valve area (Vmean): 1.46 cm^2. - Mitral valve: Valve area by continuity equation (using LVOT flow): 2.01 cm^2. - Left atrium: The atrium was mildly dilated. - Atrial septum: Echo contrast study showed no shunt. - Tricuspid valve: There was mild-moderate regurgitation directed centrally. - Pulmonary arteries: Systolic pressure was moderately increased. PA peak pressure: 50 mm Hg (S).  Assessment & Plan 1. CVA (cerebral vascular accident) S/p residual left sided weakness and dysphagia. Speech is slow and slightly slurred. Continue to work with speech and PT/OT. Continue eliquis 5mg  twice daily for stroke prophylaxis.   2. Essential hypertension Continue lopressor 25mg  twice daily with lisinopril 40mg  daily. Monitor bp  3. Hypothyroidism, unspecified hypothyroidism type Continue levothyroxine 14mcg daily.   4. Type 2 diabetes mellitus with diabetic neuropathy Last a1c 6.6. Continue januvia 25mg  daily with glipizide 10mg  twice daily. Continue ACEi and aspirin. Monitor cbg daily  5. CKD (chronic kidney disease) stage 4, GFR 15-29 ml/min Avoid nephrotoxic agents, especially NSAIDs. Continue to monitor renal function  6. Anemia associated with chronic renal failure, stage 4 (severe) Last hgb 9.7. Continue to monitor h&h  7. Vitamin D deficiency Continue Vitamin D2 50,000 units weekly.   8. Hyperlipidemia LDL 91. Continue lipitor 40mg  daily.   9. Left hemiparesis S/p CVA. Continue to work with PT/OT for gait/strength/balance  training to restore/maximize function. Fall risk precautions   10. Dysphagia due to recent stroke Continue dysphagia diet and aspiration precautions. Speech to eval and treat as indicated.   11. Diabetic  ulcer of right foot associated with type 2 diabetes mellitus Initiate wound care with NS, collagen, and silicone dressing every 3 days for diabetic ulcer on right foot and Skin prep to open area of left lateral foot. Monitor for signs of infection.   12. Slow transit constipation Colace 100mg  twice daily with senokot-s 2 tabs daily and miralax 17 g daily prn. Reassess and continue to monitor  13. Chronic Atrial fib Stable. Rate-controlled. CHASVASc score 7. Continue lopressor 25mg  twice daily with eliquis 5mg  twice daily. Monitor for signs of bleeding  14. Systolic CHF Appears euvolemic on exam. EF 35-40% on recent Echo. The study was not sufficient to allow evaluation of LV diastolic function. Continue ACEis and BB. Monitor weight daily   Diagnostic Studies/Labs Ordered: cbc, cmp in 1 week  Time spent: 50 minutes with >50% of total time on care coordination   Family/Staff Communication Plan of care discussed with patient, family, and nursing staff. Patient, family, and nursing staff verbalized understanding and agree with plan of care. No additional questions or concerns reported.    Arthur Holms, MSN, AGNP-C [facility] [address] [place], [place] [address] [phone] [8am-5pm] After hours: [phone]

## 2014-10-05 ENCOUNTER — Telehealth: Payer: Self-pay | Admitting: Internal Medicine

## 2014-10-05 NOTE — Telephone Encounter (Signed)
Call to patient to confirm appointment for 10/09/14 at 1:45 lmtcb

## 2014-10-06 DIAGNOSIS — D631 Anemia in chronic kidney disease: Secondary | ICD-10-CM | POA: Diagnosis not present

## 2014-10-06 DIAGNOSIS — E1129 Type 2 diabetes mellitus with other diabetic kidney complication: Secondary | ICD-10-CM | POA: Diagnosis not present

## 2014-10-06 DIAGNOSIS — R809 Proteinuria, unspecified: Secondary | ICD-10-CM | POA: Diagnosis not present

## 2014-10-06 DIAGNOSIS — N184 Chronic kidney disease, stage 4 (severe): Secondary | ICD-10-CM | POA: Diagnosis not present

## 2014-10-09 ENCOUNTER — Encounter: Payer: Medicare Other | Admitting: Internal Medicine

## 2014-10-09 ENCOUNTER — Non-Acute Institutional Stay (SKILLED_NURSING_FACILITY): Payer: Medicare Other | Admitting: Internal Medicine

## 2014-10-09 DIAGNOSIS — I6340 Cerebral infarction due to embolism of unspecified cerebral artery: Secondary | ICD-10-CM | POA: Diagnosis not present

## 2014-10-09 DIAGNOSIS — E1122 Type 2 diabetes mellitus with diabetic chronic kidney disease: Secondary | ICD-10-CM | POA: Diagnosis not present

## 2014-10-09 DIAGNOSIS — N189 Chronic kidney disease, unspecified: Secondary | ICD-10-CM

## 2014-10-09 DIAGNOSIS — D638 Anemia in other chronic diseases classified elsewhere: Secondary | ICD-10-CM | POA: Diagnosis not present

## 2014-10-09 DIAGNOSIS — R2681 Unsteadiness on feet: Secondary | ICD-10-CM

## 2014-10-09 DIAGNOSIS — K219 Gastro-esophageal reflux disease without esophagitis: Secondary | ICD-10-CM

## 2014-10-09 DIAGNOSIS — E11621 Type 2 diabetes mellitus with foot ulcer: Secondary | ICD-10-CM | POA: Diagnosis not present

## 2014-10-09 DIAGNOSIS — L97519 Non-pressure chronic ulcer of other part of right foot with unspecified severity: Secondary | ICD-10-CM

## 2014-10-09 DIAGNOSIS — I482 Chronic atrial fibrillation, unspecified: Secondary | ICD-10-CM

## 2014-10-09 DIAGNOSIS — E039 Hypothyroidism, unspecified: Secondary | ICD-10-CM | POA: Diagnosis not present

## 2014-10-09 DIAGNOSIS — I69391 Dysphagia following cerebral infarction: Secondary | ICD-10-CM

## 2014-10-09 DIAGNOSIS — I69922 Dysarthria following unspecified cerebrovascular disease: Secondary | ICD-10-CM

## 2014-10-09 DIAGNOSIS — I5022 Chronic systolic (congestive) heart failure: Secondary | ICD-10-CM | POA: Diagnosis not present

## 2014-10-09 DIAGNOSIS — K5901 Slow transit constipation: Secondary | ICD-10-CM

## 2014-10-09 DIAGNOSIS — I130 Hypertensive heart and chronic kidney disease with heart failure and stage 1 through stage 4 chronic kidney disease, or unspecified chronic kidney disease: Secondary | ICD-10-CM

## 2014-10-09 DIAGNOSIS — I639 Cerebral infarction, unspecified: Secondary | ICD-10-CM

## 2014-10-09 DIAGNOSIS — N184 Chronic kidney disease, stage 4 (severe): Secondary | ICD-10-CM

## 2014-10-09 DIAGNOSIS — I69322 Dysarthria following cerebral infarction: Secondary | ICD-10-CM

## 2014-10-09 NOTE — Progress Notes (Addendum)
Patient ID: [name], female   DOB: 17-Oct-1934, 80 y.o.   MRN: [id number]     Facility: [facility]    PCP: [name], MD  Code Status: full code  No Known Allergies  Chief Complaint  Patient presents with  . New Admit To SNF     HPI:  80 year old patient is here for short term rehabilitation post hospital admission from 09/30/14 - 0/73/71 with acute embolic stroke. CT head showed no acute intracranial findings but was significant for chronic ischemic microvascular disease with a few small old bilateral central lacunar infarcts. MRI showed significant patchy areas of acute ischemia in the Right frontal lobe alongwith remote bilateral thalamus and Right basal ganglia lacunar infarct. Neurology was consulted. She was started on eliquis bid for anticoagulation and her statin was changed to atorvastatin 40 mg daily. She has PMH of atrial fibrillation, HTN, HLD, DM2, anemia, CKD4, hypothyroidism. She is seen in her room today. She is sitting on her wheelchair, able to wheel herself some. She is in no distress. Complaints of stuffiness in her head and clear nasal discharge with post nasal drip. Has been working with therapy team and feels she has been making some improvement  Review of Systems:  Constitutional: positive for easy fatigue. Negative for fever, chills, diaphoresis.  HENT: Negative for headache, congestion, nasal discharge Eyes: Negative for eye pain, blurred vision, double vision and discharge.  Respiratory: Negative for cough, shortness of breath and wheezing.   Cardiovascular: Negative for chest pain, palpitations, leg swelling.  Gastrointestinal: Negative for heartburn, nausea, vomiting, abdominal pain. Had bowel movement this am Genitourinary: Negative for dysuria and flank pain.  Musculoskeletal: Negative for back pain, falls Skin: Negative for itching, rash.  Neurological: Negative for dizziness, tingling. Has generalized weakness, left  greater than right Psychiatric/Behavioral: Negative for depression   Past Medical History  Diagnosis Date  . Hypertension   . Hyperlipidemia   . Chronic venous insufficiency   . Osteomyelitis     s/p Rt 2nd toe and left 5 toes  amputation in 1/12  by Dr. Sharol Given  . Diabetes mellitus   . Anemia     b12 def, iron def, follow at cancer center, gets B12 and  another injection there. Could not remeber the name. Dr. Ralene Ok is her  cancer doctor  . Thyroid disease     hypothyroidism h/o hyperthyroidism s/p ablation/ectomy  . Asthma   . Dysrhythmia     atrial fibb  . Shortness of breath   . Blood transfusion     two or more yrs ago  . Hypothyroidism     thyroid removed 4 or more yrs ago  . Atrial fibrillation   . Chronic renal insufficiency    Past Surgical History  Procedure Laterality Date  . Eye surgery      cat ext ou  . Appendectomy      teenager  . Amputation  04/30/2011    Procedure: AMPUTATION FOOT;  Surgeon: Newt Minion, MD;  Location: [place];  Service: Orthopedics;  Laterality: Right;  Right Midfoot Amputation   Social History:   reports that she has never smoked. She has never used smokeless tobacco. She reports that she does not drink alcohol or use illicit drugs.  Family History  Problem Relation Age of Onset  . Anemia Mother   . HIV Brother   . Cancer Brother   . Colon cancer Mother     rectal  Medications: Patient's Medications  New Prescriptions   No medications on file  Previous Medications   APIXABAN (ELIQUIS) 5 MG TABS TABLET    Take 1 tablet (5 mg total) by mouth 2 (two) times daily.   ASPIRIN EC 81 MG EC TABLET    Take 1 tablet (81 mg total) by mouth daily.   ATORVASTATIN (LIPITOR) 40 MG TABLET    Take 1 tablet (40 mg total) by mouth daily at 6 PM.   CALCITRIOL (ROCALTROL) 0.25 MCG CAPSULE    Take 1 capsule by mouth every Monday, Wednesday, and Friday.   CHOLECALCIFEROL (VITAMIN D3) 10000 UNITS CAPSULE    Take 1 capsule (10,000 Units total) by  mouth once a week.   DOCUSATE SODIUM (COLACE) 100 MG CAPSULE    Take 100 mg by mouth 2 (two) times daily.   GLIPIZIDE (GLUCOTROL) 10 MG TABLET    Take 1 tablet (10 mg total) by mouth 2 (two) times daily before a meal.   GLUCOSE BLOOD TEST STRIP    Use as instructed   JANUVIA 25 MG TABLET    TAKE 1 TABLET BY MOUTH EVERY DAY   LANCETS 30G MISC    Use to check blood sugars as directed   LEVOTHYROXINE (SYNTHROID, LEVOTHROID) 112 MCG TABLET    Take 1 tablet (112 mcg total) by mouth daily before breakfast.   LISINOPRIL (PRINIVIL,ZESTRIL) 40 MG TABLET    TAKE ONE TABLET BY MOUTH ONE TIME DAILY   METOPROLOL (LOPRESSOR) 25 MG TABLET    Take 1 tablet (25 mg total) by mouth 2 (two) times daily.   PANTOPRAZOLE (PROTONIX) 40 MG TABLET    Take 1 tablet (40 mg total) by mouth daily.   POLYETHYLENE GLYCOL (MIRALAX / GLYCOLAX) PACKET    Take 17 g by mouth daily as needed.   SENNOSIDES-DOCUSATE SODIUM (SENOKOT-S) 8.6-50 MG TABLET    Take 2 tablets by mouth daily.  Modified Medications   No medications on file  Discontinued Medications   No medications on file     Physical Exam: Filed Vitals:   10/09/14 1054  BP: 156/96  Pulse: 85  Temp: 97.4 F (36.3 C)  Resp: 18  SpO2: 97%    General- elderly female, in no acute distress Head- normocephalic, atraumatic Nose- normal nasal mucosa, no maxillary or frontal sinus tenderness, no nasal discharge Throat- moist mucus membrane, upper and lower dentures, no thrush Eyes- PERRLA, EOMI, no pallor, no icterus, no discharge, normal conjunctiva, normal sclera Neck- no cervical lymphadenopathy, no jugular vein distension Cardiovascular- irregular heart rate, systolic murmur +, palpable dorsalis pedis, trace leg edema Respiratory- bilateral clear to auscultation, no wheeze, no rhonchi, no crackles, no use of accessory muscles Abdomen- bowel sounds present, soft, non tender Musculoskeletal- able to move all 4 extremities, generalized weakness with left > right,  amputated toes on both feet Neurological- slurred speech, left facial droop, left extremity weakness Skin- warm and dry, wound on left lateral foot and diabetic ulcer on bottom of right foot Psychiatry- alert and oriented to person, place and time, normal mood and affect    Labs reviewed: Basic Metabolic Panel:  Recent Labs  09/30/14 1330 10/01/14 0144 10/02/14 0349  NA 138 139 138  K 4.9 4.5 4.6  CL 108 107 107  CO2 19* 22 22  GLUCOSE 89 106* 94  BUN 45* 40* 40*  CREATININE 2.39* 2.23* 2.29*  CALCIUM 9.0 8.9 8.5*   Liver Function Tests:  Recent Labs  06/22/14 1027 07/19/14 0910 09/30/14 1330  AST 11 12 18   ALT 6 <6 11*  ALKPHOS 88 84 77  BILITOT 0.50 0.54 0.6  PROT 7.5 7.1 7.5  ALBUMIN 3.8 3.6 3.8   No results for input(s): LIPASE, AMYLASE in the last 8760 hours. No results for input(s): AMMONIA in the last 8760 hours. CBC:  Recent Labs  05/18/14 0936 06/22/14 1027 07/19/14 0910 09/30/14 1330 10/01/14 0144 10/02/14 0349  WBC 7.0 7.6 7.2 7.9 7.0 7.1  NEUTROABS 4.3 4.1 4.4  --   --   --   HGB 10.3* 10.7* 10.4* 10.6* 10.0* 9.7*  HCT 33.9* 35.1 33.8* 32.8* 31.8* 30.4*  MCV 89.0 88.4 86.6 86.3 86.4 87.1  PLT 147 147 119* 129* 123* 121*   Cardiac Enzymes:  Recent Labs  10/01/14 0144 10/01/14 0909 10/01/14 1343  TROPONINI 0.06* 0.06* 0.06*   BNP: Invalid input(s): POCBNP CBG:  Recent Labs  10/02/14 2142 10/03/14 0652 10/03/14 1141  GLUCAP 124* 124* 166*    Radiological Exams: 09/30/14: Head CT No acute intracranial findings.   Chronic ischemic microvascular disease in a few small old bilateral central lacunar infarcts.   09/29/13: Brain MRI/MRA MRI HEAD: Moderately motion degraded examination. Patchy areas of acute ischemia RIGHT frontal lobe, MCA territory.   Involutional changes. At least mild white matter changes likely represent chronic small vessel ischemic disease. Remote bilateral thalamus and RIGHT basal ganglia lacunar infarct.    MRA HEAD: Moderately motion degraded examination without large vessel occlusion or definite high-grade stenosis though limited assessment at the circle of Willis.   10/02/14: 2D Echo - Left ventricle: The cavity size was normal. There was mild concentric hypertrophy. Systolic function was moderately reduced. The estimated ejection fraction was in the range of 35% to 40%. Hypokinesis of the mid-apicalanteroseptal, anterior, inferior, and apical myocardium. The study is not technically sufficient to allow evaluation of LV diastolic function. - Aortic valve: There was mild stenosis. There was mild regurgitation. Valve area (VTI): 1.65 cm^2. Valve area (Vmax): 1.53 cm^2. Valve area (Vmean): 1.46 cm^2. - Mitral valve: Valve area by continuity equation (using LVOT flow): 2.01 cm^2. - Left atrium: The atrium was mildly dilated. - Atrial septum: Echo contrast study showed no shunt. - Tricuspid valve: There was mild-moderate regurgitation directed centrally. - Pulmonary arteries: Systolic pressure was moderately increased. PA peak pressure: 50 mm Hg (S).   Assessment/Plan  Unsteady gait Post cva with left sided weakness. Will have her work with physical therapy and occupational therapy team to help with gait training and muscle strengthening exercises.fall precautions. Skin care. Encourage to be out of bed.   Acute embolic stroke With history of afib. Continue eliquis 5 mg twice daily, aspirin EC 81 mg daily and atorvastatin 40 mg daily  Dysarthria S/p stroke. To work with SLP team  Dysphagia post cva Aspiration precautions, dysphagia diet and to follow with SLP  Essential hypertension Continue lopressor 25mg  twice daily with lisinopril 40mg  daily. Monitor bp  afib Rate controlled. Continue lopressor 25 mg twice daily and eliquis 5 mg twice daily  ckd Monitor renal function with her on lisinopril  Type 2 diabetes mellitus with renal disease Last a1c 6.6. Continue januvia 25mg  daily with  glipizide 10mg  twice daily. Continue ACEI, statin and aspirin. Monitor cbg daily. Monitor bmp with impaired renal function  Diabetic ulcer of foot Continue wound care with NS, collagen and silicone dressing, monitor for signs of infection  Systolic chf Ef 16-60% on recent echocardiogram. Continue lisinopril and lopressor. Monitor weight  Anemia of chronic disease  Monitor h7h last hb 9.7  Hypothyroidism Continue levothyroxine 173mcg daily.   gerd Continue protonix 40 mg daily  Slow transit constipation Continue Colace 100mg  twice daily with senokot-s 2 tabs daily and miralax 17 g daily prn.    Goals of care: short term rehabilitation   Labs/tests ordered: cbc, cmp  Family/ staff Communication: reviewed care plan with patient and nursing supervisor    Blanchie Serve, MD  [facility] [phone] (Monday-Friday 8 am - 5 pm) [phone] (afterhours)   Allergic rhinitis- add claritin 10 mg daily x 5 days, then daily prn and reassess  Hypertension- bp elevated today, check bp q shift, goal bp < 130/80. If remains elevated, increase dose of lopressor or consider adding hydralazine

## 2014-10-11 ENCOUNTER — Ambulatory Visit (HOSPITAL_BASED_OUTPATIENT_CLINIC_OR_DEPARTMENT_OTHER): Payer: Medicare Other | Admitting: Hematology

## 2014-10-11 ENCOUNTER — Encounter: Payer: Self-pay | Admitting: Hematology

## 2014-10-11 ENCOUNTER — Ambulatory Visit: Payer: Medicare Other

## 2014-10-11 ENCOUNTER — Telehealth: Payer: Self-pay | Admitting: Hematology

## 2014-10-11 ENCOUNTER — Other Ambulatory Visit (HOSPITAL_BASED_OUTPATIENT_CLINIC_OR_DEPARTMENT_OTHER): Payer: Medicare Other

## 2014-10-11 VITALS — BP 165/65 | HR 82 | Temp 97.8°F | Resp 18 | Ht 66.0 in | Wt 180.5 lb

## 2014-10-11 DIAGNOSIS — E538 Deficiency of other specified B group vitamins: Secondary | ICD-10-CM

## 2014-10-11 DIAGNOSIS — D631 Anemia in chronic kidney disease: Secondary | ICD-10-CM

## 2014-10-11 DIAGNOSIS — D472 Monoclonal gammopathy: Secondary | ICD-10-CM | POA: Diagnosis not present

## 2014-10-11 DIAGNOSIS — N183 Chronic kidney disease, stage 3 (moderate): Principal | ICD-10-CM

## 2014-10-11 DIAGNOSIS — N184 Chronic kidney disease, stage 4 (severe): Secondary | ICD-10-CM | POA: Diagnosis not present

## 2014-10-11 LAB — CBC & DIFF AND RETIC
BASO%: 0.2 % (ref 0.0–2.0)
Basophils Absolute: 0 10*3/uL (ref 0.0–0.1)
EOS%: 8.1 % — ABNORMAL HIGH (ref 0.0–7.0)
Eosinophils Absolute: 0.7 10*3/uL — ABNORMAL HIGH (ref 0.0–0.5)
HCT: 33.2 % — ABNORMAL LOW (ref 34.8–46.6)
HGB: 10.5 g/dL — ABNORMAL LOW (ref 11.6–15.9)
Immature Retic Fract: 2.4 % (ref 1.60–10.00)
LYMPH#: 1.9 10*3/uL (ref 0.9–3.3)
LYMPH%: 20.7 % (ref 14.0–49.7)
MCH: 27.9 pg (ref 25.1–34.0)
MCHC: 31.6 g/dL (ref 31.5–36.0)
MCV: 88.3 fL (ref 79.5–101.0)
MONO#: 0.6 10*3/uL (ref 0.1–0.9)
MONO%: 6.3 % (ref 0.0–14.0)
NEUT%: 64.7 % (ref 38.4–76.8)
NEUTROS ABS: 5.8 10*3/uL (ref 1.5–6.5)
Platelets: 190 10*3/uL (ref 145–400)
RBC: 3.76 10*6/uL (ref 3.70–5.45)
RDW: 14.8 % — AB (ref 11.2–14.5)
RETIC CT ABS: 28.58 10*3/uL — AB (ref 33.70–90.70)
Retic %: 0.76 % (ref 0.70–2.10)
WBC: 8.9 10*3/uL (ref 3.9–10.3)

## 2014-10-11 LAB — COMPREHENSIVE METABOLIC PANEL (CC13)
ALK PHOS: 111 U/L (ref 40–150)
ALT: 11 U/L (ref 0–55)
AST: 20 U/L (ref 5–34)
Albumin: 3.2 g/dL — ABNORMAL LOW (ref 3.5–5.0)
Anion Gap: 12 mEq/L — ABNORMAL HIGH (ref 3–11)
BILIRUBIN TOTAL: 0.38 mg/dL (ref 0.20–1.20)
BUN: 49.8 mg/dL — AB (ref 7.0–26.0)
CALCIUM: 8.7 mg/dL (ref 8.4–10.4)
CO2: 21 meq/L — AB (ref 22–29)
Chloride: 106 mEq/L (ref 98–109)
Creatinine: 2.7 mg/dL — ABNORMAL HIGH (ref 0.6–1.1)
EGFR: 19 mL/min/{1.73_m2} — ABNORMAL LOW (ref >90)
GLUCOSE: 96 mg/dL (ref 70–140)
POTASSIUM: 5 meq/L (ref 3.5–5.1)
SODIUM: 139 meq/L (ref 136–145)
Total Protein: 7.4 g/dL (ref 6.4–8.3)

## 2014-10-11 MED ORDER — DARBEPOETIN ALFA 200 MCG/0.4ML IJ SOSY: 200.0000 ug | PREFILLED_SYRINGE | Freq: Once | INTRAMUSCULAR | Status: DC

## 2014-10-11 NOTE — Telephone Encounter (Signed)
Gave adn printed appt sched and avs for pt for July  °

## 2014-10-11 NOTE — Addendum Note (Signed)
Addended by: Jesse Fall on: 10/11/2014 02:46 PM   Modules accepted: Medications

## 2014-10-11 NOTE — Progress Notes (Signed)
[facility] OFFICE PROGRESS NOTE  [name], MD [place] [id number]  DIAGNOSIS: Anemia, unspecified anemia type  CHIEF COMPLAIN: Follow up MGUS and anemia.  CURRENT THERAPY: Vitamin B12 shots every 8 weeks and aranesp 200 mcg every 4 weeks for a hemoglobin less than or equal to 11, started on 01/09/2013. Her last injections were on 03/08/2014, and restarted on 07/19/2014   PROBLEM LIST:  1. Chronic anemia.  2. Renal insufficiency.  3. Atrial fibrillation.  4. 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  5. Low vitamin B12 level.  6. Osteomyelitis of the left foot status post left mid foot amputation  on 05/30/2010.  7. Status post amputation of toes of right foot in December 2012.  8. Hypertension.  9. Diabetes mellitus.  10.Venous insufficiency.  11.Dyslipidemia.  12.Status post total thyroidectomy for multinodular goiter on  03/13/2006.  13.History of asthma.  14.Thrombocytopenia with variable platelet count first noted 08/2010,  suspect chronic idiopathic thrombocytopenic purpura.  15. 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  INTERVAL HISTORY: IO DIEUJUSTE 80 y.o. female with a history of chronic kidney disease complicated by anemia and [place] B12 deficiency is here for follow-up.    She was hospitalized 5/14-5/17/2016 for CVA. She presented with slurry speech and mild facial droop. MRI was significant for patchy areas of acute ischemia in the R frontal lobe (MCA territory) and remote bilateral thalamus and R basal ganglia lacunar infarct. She was discharged to a nursing home. She is currently well, her surgery speech has improved, she still has a mild left facial droop. She uses a wheelchair most of time, able to use a walker to walk to bathroom. She is having physical therapy and occupational therapy every day, she may be discharged home from the rehabilitation next  week. She denies any new bone pain, no recent episode of infection, appetite is fair.  MEDICAL HISTORY: Past Medical History  Diagnosis Date  . Hypertension   . Hyperlipidemia   . Chronic venous insufficiency   . Osteomyelitis     s/p Rt 2nd toe and left 5 toes  amputation in 1/12  by Dr. Sharol Given  . Diabetes mellitus   . Anemia     b12 def, iron def, follow at cancer center, gets B12 and  another injection there. Could not remeber the name. Dr. Ralene Ok is her  cancer doctor  . Thyroid disease     hypothyroidism h/o hyperthyroidism s/p ablation/ectomy  . Asthma   . Dysrhythmia     atrial fibb  . Shortness of breath   . Blood transfusion     two or more yrs ago  . Hypothyroidism     thyroid removed 4 or more yrs ago  . Atrial fibrillation   . Chronic renal insufficiency      ALLERGIES:  has No Known Allergies.  MEDICATIONS: has a current medication list which includes the following prescription(s): apixaban, aspirin, atorvastatin, calcitriol, vitamin d3, docusate sodium, glipizide, glucose blood, januvia, lancets 30g, levothyroxine, lisinopril, metoprolol tartrate, pantoprazole, polyethylene glycol, and sennosides-docusate sodium.  SURGICAL HISTORY:  Past Surgical History  Procedure Laterality Date  . Eye surgery      cat ext ou  . Appendectomy      teenager  . Amputation  04/30/2011    Procedure: AMPUTATION FOOT;  Surgeon: Newt Minion, MD;  Location: [facility];  Service: Orthopedics;  Laterality: Right;  Right Midfoot Amputation    REVIEW OF SYSTEMS:   Constitutional: Denies fevers, chills or abnormal weight loss Eyes: Denies blurriness of vision Ears, nose, mouth, throat, and face: Denies mucositis or sore throat Respiratory: Denies cough, dyspnea or wheezes Cardiovascular: Denies palpitation, chest discomfort or lower extremity swelling Gastrointestinal:  Denies nausea, heartburn or change in bowel habits Skin: Denies abnormal skin rashes Lymphatics: Denies new  lymphadenopathy or easy bruising Neurological:Denies numbness, tingling or new weaknesses Behavioral/Psych: Mood is stable, no new changes  All other systems were reviewed with the patient and are negative.  PHYSICAL EXAMINATION: ECOG PERFORMANCE STATUS: 2  Blood pressure 165/65, pulse 82, temperature 97.8 F (36.6 C), temperature source Oral, resp. rate 18, height 5' 6"  (1.676 m), weight 180 lb 8 oz (81.874 kg), SpO2 100 %.  GENERAL:alert, no distress and comfortable; chronically ill elderly female who uses a walker.  SKIN: skin color, texture, turgor are normal, no rashes or significant lesions EYES: normal, Conjunctiva are pink and non-injected, sclera clear OROPHARYNX:no exudate, no erythema and lips, buccal mucosa, and tongue normal  NECK: supple, thyroid normal size, non-tender, without nodularity LYMPH:  no palpable lymphadenopathy in the cervical, axillary or supraclavicular LUNGS: clear to auscultation and percussion with normal breathing effort HEART: irregular rate & rhythm and no murmurs and 1+ lower extremity edema ABDOMEN:abdomen soft, non-tender and normal bowel sounds Musculoskeletal:no cyanosis of digits and no clubbing; s/p bilateral toes amputations as noted above.  NEURO: alert & oriented x 3 with fluent speech, ambulates via a wheelchair, she has mild left facial droop, other cranial nerve exam were unremarkable, no focal motor or sensation deficiency.  LABORATORY DATA: CBC Latest Ref Rng 10/11/2014 10/02/2014 10/01/2014  WBC 3.9 - 10.3 10e3/uL 8.9 7.1 7.0  Hemoglobin 11.6 - 15.9 g/dL 10.5(L) 9.7(L) 10.0(L)  Hematocrit 34.8 - 46.6 % 33.2(L) 30.4(L) 31.8(L)  Platelets 145 - 400 10e3/uL 190 121(L) 123(L)    CMP Latest Ref Rng 10/02/2014 10/01/2014 09/30/2014  Glucose 65 - 99 mg/dL 94 106(H) 89  BUN 6 - 20 mg/dL 40(H) 40(H) 45(H)  Creatinine 0.44 - 1.00 mg/dL 2.29(H) 2.23(H) 2.39(H)  Sodium 135 - 145 mmol/L 138 139 138  Potassium 3.5 - 5.1 mmol/L 4.6 4.5 4.9  Chloride  101 - 111 mmol/L 107 107 108  CO2 22 - 32 mmol/L 22 22 19(L)  Calcium 8.9 - 10.3 mg/dL 8.5(L) 8.9 9.0  Total Protein 6.5 - 8.1 g/dL - - 7.5  Total Bilirubin 0.3 - 1.2 mg/dL - - 0.6  Alkaline Phos 38 - 126 U/L - - 77  AST 15 - 41 U/L - - 18  ALT 14 - 54 U/L - - 11(L)     RADIOGRAPHIC STUDIES: 1. Digital screening mammogram on 07/26/2010 was negative.  2. MRI of the right forefoot without IV contrast on 08/20/2010 showed  apparent new soft tissue ulceration distal to the third metatarsal status post prior resection of the third toe. No soft tissue abscess was demonstrated. There was interval resolution of the previously demonstrated marrow edema within the third metatarsal. No evidence for osteomyelitis. There was stable mid foot degenerative changes and chronic myopathic changes.  3. CT of the cervical spine without IV contrast on 10/21/2010 showed  degenerative changes in the cervical spine. No displaced fractures  were identified.  4. CT of the head without IV contrast on 10/21/2010 showed no evidence  for acute intracranial hemorrhage, mass lesion or acute infarct. There was some mild diffuse atrophy. There was a subcutaneous hematoma over  the left anterior frontal/periorbital region. No underlying skull fracture or acute intracranial abnormality.  5. Chest x-ray, 2 view, from 04/28/2011 showed stable moderate  cardiomegaly with no active lung disease. There was probable mild  scarring at the lung bases.   ASSESSMENT: NANNETTE ZILL 80 y.o. female with a history of multiple comorbidities, including anemia, IgA MGUS, renal failure, B12 deficiency, etc.  1. Anemia and MGUS, rule out MM  --her anemia has been getting slightly worse over the past year, despite Arenasp injection every 1-2 months. -Given her history of IgA MGUS, renal failure, and worsening anemia, multiple myeloma needs to be ruled out. Her previous M protein level was low, IgA and light chain levels are elevated. -Her  Skeletal survey was normal.  -I have discussed bone marrow biopsy several times, and she repeatedly declined due to the concern of complications from procedure such as pain. I'll hold on for now. -Her hemoglobin is 10.5 today, given her recent stroke, I would hold aranesp today, continue every 2 month if Hb<10.5.    2. Right front lobe CVA in 09/2014 -likely related to her AF without anticoagulation .  -She is recovering slowly, still has mild history speech and left facial droop, she still uses wheelchair most of time, getting PT and OT in the rehabilitation. -She is on a eliquis now   3. Hypertension, diabetes, A. Fib, CKD, and other medical problems -She will continue follow-up with her primary care physician. She is on Eliquis now for A. Fib  All questions were answered. The patient knows to call the clinic with any problems, questions or concerns. We can certainly see the patient much sooner if necessary.  Plan -No Aranesp today -Return to clinic in 2 months, resume Aranesp if hemoglobin less than 10.5 on next visit   I spent 15 minutes counseling the patient face to face. The total time spent in the appointment was 20 minutes.    Truitt Merle, MD 10/11/2014   1:20 PM

## 2014-10-13 LAB — SPEP & IFE WITH QIG
ABNORMAL PROTEIN BAND1: 0.3 g/dL
ALBUMIN ELP: 3.4 g/dL — AB (ref 3.8–4.8)
Alpha-1-Globulin: 0.5 g/dL — ABNORMAL HIGH (ref 0.2–0.3)
Alpha-2-Globulin: 1 g/dL — ABNORMAL HIGH (ref 0.5–0.9)
BETA 2: 0.4 g/dL (ref 0.2–0.5)
BETA GLOBULIN: 0.7 g/dL — AB (ref 0.4–0.6)
Gamma Globulin: 1 g/dL (ref 0.8–1.7)
IGA: 622 mg/dL — AB (ref 69–380)
IgG (Immunoglobin G), Serum: 1250 mg/dL (ref 690–1700)
IgM, Serum: 58 mg/dL (ref 52–322)
Total Protein, Serum Electrophoresis: 7 g/dL (ref 6.1–8.1)

## 2014-10-25 ENCOUNTER — Non-Acute Institutional Stay (SKILLED_NURSING_FACILITY): Payer: Medicare Other | Admitting: Nurse Practitioner

## 2014-10-25 DIAGNOSIS — I482 Chronic atrial fibrillation, unspecified: Secondary | ICD-10-CM

## 2014-10-25 DIAGNOSIS — N184 Chronic kidney disease, stage 4 (severe): Secondary | ICD-10-CM

## 2014-10-25 DIAGNOSIS — E1140 Type 2 diabetes mellitus with diabetic neuropathy, unspecified: Secondary | ICD-10-CM | POA: Diagnosis not present

## 2014-10-25 DIAGNOSIS — I10 Essential (primary) hypertension: Secondary | ICD-10-CM | POA: Diagnosis not present

## 2014-10-25 DIAGNOSIS — D631 Anemia in chronic kidney disease: Secondary | ICD-10-CM

## 2014-10-25 DIAGNOSIS — I639 Cerebral infarction, unspecified: Secondary | ICD-10-CM

## 2014-10-25 DIAGNOSIS — E039 Hypothyroidism, unspecified: Secondary | ICD-10-CM | POA: Diagnosis not present

## 2014-10-25 NOTE — Progress Notes (Signed)
Patient ID: [name], female   DOB: 1935-01-30, 80 y.o.   MRN: [id number]    Nursing Home Location:  [place] of Service: SNF (31)  PCP: [name], MD  No Known Allergies  Chief Complaint  Patient presents with  . Discharge Note    HPI:  Patient is a 80 y.o. female seen today at [facility] for discharge home. Pt with PMH of atrial fibrillation, HTN, HLD, DM2, anemia, CKD4, hypothyroidism is here for short term rehabilitation post hospital admission from 09/30/14 - 9/92/42 with acute embolic stroke. CT head showed no acute intracranial findings but was significant for chronic ischemic microvascular disease with a few small old bilateral central lacunar infarcts. MRI showed significant patchy areas of acute ischemia in the Right frontal lobe alongwith remote bilateral thalamus and Right basal ganglia lacunar infarct. Neurology was consulted. She was started on eliquis bid for anticoagulation and her statin was changed to atorvastatin 40 mg daily. Patient currently doing well with therapy, now stable to discharge home with home health.    Review of Systems:  Review of Systems  Constitutional: Negative for activity change, appetite change, fatigue and unexpected weight change.  HENT: Negative for congestion and hearing loss.        Noted increase saliva, trouble swallowing   Eyes: Negative.   Respiratory: Negative for cough and shortness of breath.   Cardiovascular: Negative for chest pain, palpitations and leg swelling.  Gastrointestinal: Negative for abdominal pain, diarrhea and constipation.  Genitourinary: Negative for dysuria and difficulty urinating.  Musculoskeletal: Negative for myalgias and arthralgias.  Skin: Negative for color change and wound.  Neurological: Positive for facial asymmetry (left side droop after CVA) and weakness (improving with therapy). Negative for dizziness.  Psychiatric/Behavioral: Negative for  behavioral problems, confusion and agitation.    Past Medical History  Diagnosis Date  . Hypertension   . Hyperlipidemia   . Chronic venous insufficiency   . Osteomyelitis     s/p Rt 2nd toe and left 5 toes  amputation in 1/12  by Dr. Sharol Given  . Diabetes mellitus   . Anemia     b12 def, iron def, follow at cancer center, gets B12 and  another injection there. Could not remeber the name. Dr. Ralene Ok is her  cancer doctor  . Thyroid disease     hypothyroidism h/o hyperthyroidism s/p ablation/ectomy  . Asthma   . Dysrhythmia     atrial fibb  . Shortness of breath   . Blood transfusion     two or more yrs ago  . Hypothyroidism     thyroid removed 4 or more yrs ago  . Atrial fibrillation   . Chronic renal insufficiency    Past Surgical History  Procedure Laterality Date  . Eye surgery      cat ext ou  . Appendectomy      teenager  . Amputation  04/30/2011    Procedure: AMPUTATION FOOT;  Surgeon: Newt Minion, MD;  Location: [facility];  Service: Orthopedics;  Laterality: Right;  Right Midfoot Amputation   Social History:   reports that she has never smoked. She has never used smokeless tobacco. She reports that she does not drink alcohol or use illicit drugs.  Family History  Problem Relation Age of Onset  . Anemia Mother   . HIV Brother   . Cancer Brother   . Colon cancer Mother     rectal  Medications: Patient's Medications  New Prescriptions   No medications on file  Previous Medications   APIXABAN (ELIQUIS) 5 MG TABS TABLET    Take 1 tablet (5 mg total) by mouth 2 (two) times daily.   ASPIRIN EC 81 MG EC TABLET    Take 1 tablet (81 mg total) by mouth daily.   ATORVASTATIN (LIPITOR) 40 MG TABLET    Take 1 tablet (40 mg total) by mouth daily at 6 PM.   CALCITRIOL (ROCALTROL) 0.25 MCG CAPSULE    Take 1 capsule by mouth every Monday, Wednesday, and Friday.   CHOLECALCIFEROL (VITAMIN D3) 10000 UNITS CAPSULE    Take 1 capsule (10,000 Units total) by mouth once a week.    DOCUSATE SODIUM (COLACE) 100 MG CAPSULE    Take 100 mg by mouth 2 (two) times daily.   GLIPIZIDE (GLUCOTROL) 10 MG TABLET    Take 1 tablet (10 mg total) by mouth 2 (two) times daily before a meal.   GLUCOSE BLOOD TEST STRIP    Use as instructed   JANUVIA 25 MG TABLET    TAKE 1 TABLET BY MOUTH EVERY DAY   LANCETS 30G MISC    Use to check blood sugars as directed   LEVOTHYROXINE (SYNTHROID, LEVOTHROID) 112 MCG TABLET    Take 1 tablet (112 mcg total) by mouth daily before breakfast.   LISINOPRIL (PRINIVIL,ZESTRIL) 40 MG TABLET    TAKE ONE TABLET BY MOUTH ONE TIME DAILY   LORATADINE (CLARITIN) 10 MG TABLET    Take 10 mg by mouth daily. 10/10/14 Daily x 5 days then prn daily   METOPROLOL (LOPRESSOR) 25 MG TABLET    Take 1 tablet (25 mg total) by mouth 2 (two) times daily.   PANTOPRAZOLE (PROTONIX) 40 MG TABLET    Take 1 tablet (40 mg total) by mouth daily.   POLYETHYLENE GLYCOL (MIRALAX / GLYCOLAX) PACKET    Take 17 g by mouth daily as needed.   SENNOSIDES-DOCUSATE SODIUM (SENOKOT-S) 8.6-50 MG TABLET    Take 2 tablets by mouth daily.  Modified Medications   No medications on file  Discontinued Medications   No medications on file     Physical Exam: Filed Vitals:   10/25/14 1215  BP: 109/65  Pulse: 63  Temp: 97.3 F (36.3 C)  Resp: 20    Physical Exam  Constitutional: She appears well-developed and well-nourished. No distress.  HENT:  Head: Normocephalic and atraumatic.  Mouth/Throat: Oropharynx is clear and moist. No oropharyngeal exudate.  Eyes: Conjunctivae are normal. Pupils are equal, round, and reactive to light.  Neck: Normal range of motion. Neck supple.  Cardiovascular: Normal rate.  An irregular rhythm present.  Murmur heard. Pulmonary/Chest: Effort normal and breath sounds normal.  Abdominal: Soft. Bowel sounds are normal.  Musculoskeletal: She exhibits no edema or tenderness.  generalized weakness with left > right  Neurological: She is alert.  Skin: Skin is warm and  dry. She is not diaphoretic.  Psychiatric: She has a normal mood and affect.    Labs reviewed: Basic Metabolic Panel:  Recent Labs  09/30/14 1330 10/01/14 0144 10/02/14 0349 10/11/14 1239  NA 138 139 138 139  K 4.9 4.5 4.6 5.0  CL 108 107 107  --   CO2 19* 22 22 21*  GLUCOSE 89 106* 94 96  BUN 45* 40* 40* 49.8*  CREATININE 2.39* 2.23* 2.29* 2.7*  CALCIUM 9.0 8.9 8.5* 8.7   Liver Function Tests:  Recent Labs  07/19/14 0910 09/30/14 1330 10/11/14  1239  AST 12 18 20   ALT <6 11* 11  ALKPHOS 84 77 111  BILITOT 0.54 0.6 0.38  PROT 7.1 7.5 7.4  ALBUMIN 3.6 3.8 3.2*   No results for input(s): LIPASE, AMYLASE in the last 8760 hours. No results for input(s): AMMONIA in the last 8760 hours. CBC:  Recent Labs  06/22/14 1027 07/19/14 0910  10/01/14 0144 10/02/14 0349 10/11/14 1238  WBC 7.6 7.2  < > 7.0 7.1 8.9  NEUTROABS 4.1 4.4  --   --   --  5.8  HGB 10.7* 10.4*  < > 10.0* 9.7* 10.5*  HCT 35.1 33.8*  < > 31.8* 30.4* 33.2*  MCV 88.4 86.6  < > 86.4 87.1 88.3  PLT 147 119*  < > 123* 121* 190  < > = values in this interval not displayed. TSH:  Recent Labs  12/07/13 1048  TSH 0.472   A1C: Lab Results  Component Value Date   HGBA1C 6.6 08/02/2014   Lipid Panel:  Recent Labs  12/07/13 1048 10/01/14 0144  CHOL 199 180  HDL 83 81  LDLCALC 105* 91  TRIG 57 41  CHOLHDL 2.4 2.2    Radiological Exams: 09/30/14: Head CT No acute intracranial findings.   Chronic ischemic microvascular disease in a few small old bilateral central lacunar infarcts.   09/29/13: Brain MRI/MRA MRI HEAD: Moderately motion degraded examination. Patchy areas of acute ischemia RIGHT frontal lobe, MCA territory.   Involutional changes. At least mild white matter changes likely represent chronic small vessel ischemic disease. Remote bilateral thalamus and RIGHT basal ganglia lacunar infarct.   MRA HEAD: Moderately motion degraded examination without large vessel occlusion or  definite high-grade stenosis though limited assessment at the circle of Willis.   10/02/14: 2D Echo - Left ventricle: The cavity size was normal. There was mild concentric hypertrophy. Systolic function was moderately reduced. The estimated ejection fraction was in the range of 35% to 40%. Hypokinesis of the mid-apicalanteroseptal, anterior, inferior, and apical myocardium. The study is not technically sufficient to allow evaluation of LV diastolic function. - Aortic valve: There was mild stenosis. There was mild regurgitation. Valve area (VTI): 1.65 cm^2. Valve area (Vmax): 1.53 cm^2. Valve area (Vmean): 1.46 cm^2. - Mitral valve: Valve area by continuity equation (using LVOT flow): 2.01 cm^2. - Left atrium: The atrium was mildly dilated. - Atrial septum: Echo contrast study showed no shunt. - Tricuspid valve: There was mild-moderate regurgitation directed centrally. - Pulmonary arteries: Systolic pressure was moderately increased. PA peak pressure: 50 mm Hg (S).   Assessment/Plan 1. Chronic atrial fibrillation -rate controlled on current regimen, conts on eliquis for anticoagulation.   2. Essential hypertension -blood pressure stable on lopressor and lisinopril   3. Hypothyroidism, unspecified hypothyroidism type -conts on synthroid 112 mcg  4. Type 2 diabetes mellitus with diabetic neuropathy -conts on januvia 25 and glipizide 10 mg daily   5. CVA (cerebral vascular accident)  admitted for rehab after cva with left sided weakness, also noted to have swallowing issues that she is working with speech for dysphaiga.  Pt with history of afib. Remains on eliquis 5 mg twice daily, aspirin EC 81 mg daily and atorvastatin 40 mg daily  6. Anemia associated with chronic renal failure, stage 4 (severe) Declined labs, will need to be followed by PCP  7. CKD (chronic kidney disease) stage 4, GFR 15-29 ml/min -declined labs during stay at [facility], will need to be followed by PCP  8.  Dysphagia  Ongoing  aspiration precautions, dysphagia diet and to follow with SLP

## 2014-10-30 DIAGNOSIS — I129 Hypertensive chronic kidney disease with stage 1 through stage 4 chronic kidney disease, or unspecified chronic kidney disease: Secondary | ICD-10-CM | POA: Diagnosis not present

## 2014-10-30 DIAGNOSIS — I69391 Dysphagia following cerebral infarction: Secondary | ICD-10-CM | POA: Diagnosis not present

## 2014-10-30 DIAGNOSIS — N189 Chronic kidney disease, unspecified: Secondary | ICD-10-CM | POA: Diagnosis not present

## 2014-10-30 DIAGNOSIS — I69398 Other sequelae of cerebral infarction: Secondary | ICD-10-CM | POA: Diagnosis not present

## 2014-10-30 DIAGNOSIS — E119 Type 2 diabetes mellitus without complications: Secondary | ICD-10-CM | POA: Diagnosis not present

## 2014-10-30 DIAGNOSIS — M6281 Muscle weakness (generalized): Secondary | ICD-10-CM | POA: Diagnosis not present

## 2014-10-31 ENCOUNTER — Telehealth: Payer: Self-pay | Admitting: *Deleted

## 2014-10-31 NOTE — Telephone Encounter (Signed)
Call from [place], [place] with [facility] [phone]  Pt was d/c from hospital s/p CVA and pt services started Discharged from rehab.with skilled nursing, OT, PT and Speech therapy.  Can they continue with these services? Just need Verbal Order

## 2014-10-31 NOTE — Telephone Encounter (Signed)
[facility] informed 

## 2014-10-31 NOTE — Telephone Encounter (Signed)
Yes, please continue.  Thanks

## 2014-11-06 ENCOUNTER — Telehealth: Payer: Self-pay | Admitting: *Deleted

## 2014-11-06 NOTE — Telephone Encounter (Signed)
OV given to OT

## 2014-11-06 NOTE — Telephone Encounter (Signed)
Call from [place], [place] with [facility] _ # [phone]  OT is asking for a Verbal Order to see pt once a week for one week then twice a week for 5 weeks. Will this be okay with you?

## 2014-11-06 NOTE — Telephone Encounter (Signed)
Yes, sounds fine.  Thanks

## 2014-11-15 ENCOUNTER — Encounter: Payer: Medicare Other | Admitting: Internal Medicine

## 2014-11-21 ENCOUNTER — Telehealth: Payer: Self-pay | Admitting: Dietician

## 2014-11-21 NOTE — Telephone Encounter (Signed)
Sounds excellent.  Thanks!

## 2014-11-21 NOTE — Telephone Encounter (Signed)
Has not seen an eye doctor in the past year. She does have problems with transportation. She also has therapy at home making scheduling appointments difficult.Agrees to have [place] help her schedule her eye exam with Dr. Payton [facility] office while she is here tomorrow with her daughter.

## 2014-11-22 ENCOUNTER — Encounter: Payer: Self-pay | Admitting: Internal Medicine

## 2014-11-22 ENCOUNTER — Ambulatory Visit (INDEPENDENT_AMBULATORY_CARE_PROVIDER_SITE_OTHER): Payer: Medicare Other | Admitting: Internal Medicine

## 2014-11-22 VITALS — BP 166/80 | HR 94 | Temp 97.9°F | Ht 66.0 in | Wt 189.3 lb

## 2014-11-22 DIAGNOSIS — N184 Chronic kidney disease, stage 4 (severe): Secondary | ICD-10-CM

## 2014-11-22 DIAGNOSIS — I482 Chronic atrial fibrillation, unspecified: Secondary | ICD-10-CM

## 2014-11-22 DIAGNOSIS — E11649 Type 2 diabetes mellitus with hypoglycemia without coma: Secondary | ICD-10-CM

## 2014-11-22 DIAGNOSIS — D519 Vitamin B12 deficiency anemia, unspecified: Secondary | ICD-10-CM | POA: Diagnosis not present

## 2014-11-22 DIAGNOSIS — I4891 Unspecified atrial fibrillation: Secondary | ICD-10-CM

## 2014-11-22 DIAGNOSIS — E1140 Type 2 diabetes mellitus with diabetic neuropathy, unspecified: Secondary | ICD-10-CM

## 2014-11-22 DIAGNOSIS — K644 Residual hemorrhoidal skin tags: Secondary | ICD-10-CM

## 2014-11-22 DIAGNOSIS — Z7901 Long term (current) use of anticoagulants: Secondary | ICD-10-CM

## 2014-11-22 DIAGNOSIS — D631 Anemia in chronic kidney disease: Secondary | ICD-10-CM

## 2014-11-22 DIAGNOSIS — M47819 Spondylosis without myelopathy or radiculopathy, site unspecified: Secondary | ICD-10-CM

## 2014-11-22 DIAGNOSIS — M1288 Other specific arthropathies, not elsewhere classified, other specified site: Secondary | ICD-10-CM

## 2014-11-22 DIAGNOSIS — D518 Other vitamin B12 deficiency anemias: Secondary | ICD-10-CM

## 2014-11-22 LAB — BASIC METABOLIC PANEL
ANION GAP: 9 (ref 5–15)
BUN: 44 mg/dL — AB (ref 6–20)
CALCIUM: 8.8 mg/dL — AB (ref 8.9–10.3)
CHLORIDE: 110 mmol/L (ref 101–111)
CO2: 21 mmol/L — ABNORMAL LOW (ref 22–32)
CREATININE: 2.52 mg/dL — AB (ref 0.44–1.00)
GFR calc Af Amer: 20 mL/min — ABNORMAL LOW (ref >60)
GFR calc non Af Amer: 17 mL/min — ABNORMAL LOW (ref >60)
Glucose, Bld: 36 mg/dL — CL (ref 65–99)
Potassium: 4.7 mmol/L (ref 3.5–5.1)
Sodium: 140 mmol/L (ref 135–145)

## 2014-11-22 LAB — CBC
HEMATOCRIT: 30.5 % — AB (ref 36.0–46.0)
HEMOGLOBIN: 9.7 g/dL — AB (ref 12.0–15.0)
MCH: 27.8 pg (ref 26.0–34.0)
MCHC: 31.8 g/dL (ref 30.0–36.0)
MCV: 87.4 fL (ref 78.0–100.0)
MPV: 10.9 fL (ref 8.6–12.4)
Platelets: 145 10*3/uL — ABNORMAL LOW (ref 150–400)
RBC: 3.49 MIL/uL — AB (ref 3.87–5.11)
RDW: 16.3 % — ABNORMAL HIGH (ref 11.5–15.5)
WBC: 7.4 10*3/uL (ref 4.0–10.5)

## 2014-11-22 LAB — GLUCOSE, CAPILLARY: GLUCOSE-CAPILLARY: 35 mg/dL — AB (ref 65–99)

## 2014-11-22 LAB — POCT GLYCOSYLATED HEMOGLOBIN (HGB A1C): HEMOGLOBIN A1C: 6.8

## 2014-11-22 LAB — VITAMIN B12: Vitamin B-12: 435 pg/mL (ref 211–911)

## 2014-11-22 NOTE — Progress Notes (Signed)
Hypoglycemic Event  CBG:35 Treatment: 1 cup of orange juice,graham crackers daughter taking patient to eat.  Symptoms: no symptons  Follow-up CBG: Timepatient refused CBG Resultpatient refused Patient did not have breakfast  Comments/MD notified;Patient refused to stay,daughter with patient who is headed to get mother breakfast and she will check the patients sugar herself.Daughter said this is not knew for her mother.Dr. Daryll Drown will be notified    [name], [facility] C  Remember to initiate Hypoglycemia Order Set & complete

## 2014-11-22 NOTE — Patient Instructions (Signed)
Please schedule a follow up visit within the next 2-3 months.   For your medications:   Please bring all of your pill  Bottles with you to each visit.  This will help make sure that we have an up to date list of all the medications you are taking.  Please also bring any over the counter herbal medications you are taking (not including advil, tylenol, etc.)  Please continue taking all of your medications as prescribed, most importantly your eliquis and atorvastatin to help avoid another stroke.   More information about Eliquis is below.    Thank you!  Apixaban oral tablets What is this medicine? APIXABAN (a PIX a ban) is an anticoagulant (blood thinner). It is used to lower the chance of stroke in people with a medical condition called atrial fibrillation. It is also used to treat or prevent blood clots in the lungs or in the veins. This medicine may be used for other purposes; ask your health care provider or pharmacist if you have questions. COMMON BRAND NAME(S): Eliquis What should I tell my health care provider before I take this medicine? They need to know if you have any of these conditions: -bleeding disorders -bleeding in the brain -blood in your stools (black or tarry stools) or if you have blood in your vomit -history of stomach bleeding -kidney disease -liver disease -mechanical heart valve -an unusual or allergic reaction to apixaban, other medicines, foods, dyes, or preservatives -pregnant or trying to get pregnant -breast-feeding How should I use this medicine? Take this medicine by mouth with a glass of water. Follow the directions on the prescription label. You can take it with or without food. If it upsets your stomach, take it with food. Take your medicine at regular intervals. Do not take it more often than directed. Do not stop taking except on your doctor's advice. Stopping this medicine may increase your risk of a blot clot. Be sure to refill your prescription  before you run out of medicine. Talk to your pediatrician regarding the use of this medicine in children. Special care may be needed. Overdosage: If you think you have taken too much of this medicine contact a poison control center or emergency room at once. NOTE: This medicine is only for you. Do not share this medicine with others. What if I miss a dose? If you miss a dose, take it as soon as you can. If it is almost time for your next dose, take only that dose. Do not take double or extra doses. What may interact with this medicine? This medicine may interact with the following: -aspirin and aspirin-like medicines -certain medicines for fungal infections like ketoconazole and itraconazole -certain medicines for seizures like carbamazepine and phenytoin -certain medicines that treat or prevent blood clots like warfarin, enoxaparin, and dalteparin -clarithromycin -NSAIDs, medicines for pain and inflammation, like ibuprofen or naproxen -rifampin -ritonavir -St. John's wort This list may not describe all possible interactions. Give your health care provider a list of all the medicines, herbs, non-prescription drugs, or dietary supplements you use. Also tell them if you smoke, drink alcohol, or use illegal drugs. Some items may interact with your medicine. What should I watch for while using this medicine? Notify your doctor or health care professional and seek emergency treatment if you develop breathing problems; changes in vision; chest pain; severe, sudden headache; pain, swelling, warmth in the leg; trouble speaking; sudden numbness or weakness of the face, arm, or leg. These can be signs that  your condition has gotten worse. If you are going to have surgery, tell your doctor or health care professional that you are taking this medicine. Tell your health care professional that you use this medicine before you have a spinal or epidural procedure. Sometimes people who take this medicine have  bleeding problems around the spine when they have a spinal or epidural procedure. This bleeding is very rare. If you have a spinal or epidural procedure while on this medicine, call your health care professional immediately if you have back pain, numbness or tingling (especially in your legs and feet), muscle weakness, paralysis, or loss of bladder or bowel control. Avoid sports and activities that might cause injury while you are using this medicine. Severe falls or injuries can cause unseen bleeding. Be careful when using sharp tools or knives. Consider using an Copy. Take special care brushing or flossing your teeth. Report any injuries, bruising, or red spots on the skin to your doctor or health care professional. What side effects may I notice from receiving this medicine? Side effects that you should report to your doctor or health care professional as soon as possible: -allergic reactions like skin rash, itching or hives, swelling of the face, lips, or tongue -signs and symptoms of bleeding such as bloody or black, tarry stools; red or dark-brown urine; spitting up blood or brown material that looks like coffee grounds; red spots on the skin; unusual bruising or bleeding from the eye, gums, or nose This list may not describe all possible side effects. Call your doctor for medical advice about side effects. You may report side effects to FDA at 1-800-FDA-1088. Where should I keep my medicine? Keep out of the reach of children. Store at room temperature between 20 and 25 degrees C (68 and 77 degrees F). Throw away any unused medicine after the expiration date. NOTE: This sheet is a summary. It may not cover all possible information. If you have questions about this medicine, talk to your doctor, pharmacist, or health care provider.  2015, [employer]/Gold Standard. (2013-01-07 11:59:24)

## 2014-11-22 NOTE — Progress Notes (Signed)
Subjective:    Patient ID: [name], female    DOB: 08/07/1934, 80 y.o.   MRN: [id number]  CC: Routine follow up  HPI  [name] is a 80yo woman with PMH of  CKD Stage 4, anemia related to renal failure, CVA, DM2 with neuropathy, HTN, Hypothyroidism, HLD.    Since I last saw [name], she was admitted to the hospital for a CVA and she was started on Eliquis due to history of Afib and was also switched to a high intensity statin.  She was seen by her Oncologist and there is concern for transition to MM, however, [name] continues to refuse BM biopsy for further work up.  She was in the nursing home for rehab and has been home for about 3 weeks.    She is doing well, she reports no pain.  She is taking her Eliquis as prescribed along with her other medications including lisinopril, metoprolol.  She has persistent mouth drooping and some weakness on the left.   Saw Nephro while in nursing home, no need to see for 6 months, BMET today  We did a foot exam today and she has a large callus on the lateral right foot and a developing ulcer on the dorsum of the foot.  We have advised her to go see her orthopedic surgeon.  She has no toes on both feet bilaterally.    For her DM, she is doing okay, her A1C is 6.8.  She has persistent daily sleepiness that may be related to her low blood sugars.  She had a low blood sugar in the lab, but refused to have the recheck.  She is on glipizide 10 mg BID and januvia.  She may benefit from dropping her glipizide down.  DM shoes had to go to [facility] to get. Will investigate why.    Did not bring in medications.    Review of Systems  Constitutional: Negative for fever, chills and fatigue.  HENT: Positive for drooling. Negative for ear discharge and ear pain.   Eyes: Negative for photophobia and visual disturbance.  Respiratory: Negative for cough, choking and shortness of breath.   Cardiovascular: Negative for chest pain and leg swelling.  Gastrointestinal:  Negative for abdominal pain, diarrhea and constipation.  Endocrine: Positive for cold intolerance.  Genitourinary: Negative for dysuria, enuresis and difficulty urinating.  Musculoskeletal: Negative for back pain and arthralgias.  Skin: Positive for wound (dorsum of foot and lateral callus).  Neurological: Positive for weakness. Negative for dizziness, light-headedness and headaches.  Psychiatric/Behavioral: Negative for confusion and decreased concentration.       Objective:   Physical Exam  Constitutional: She is oriented to person, place, and time. She appears well-developed and well-nourished.  Elderly woman, NAD  HENT:  Head: Normocephalic and atraumatic.  Mouth/Throat: Oropharynx is clear and moist.  Eyes: Conjunctivae are normal. Pupils are equal, round, and reactive to light. No scleral icterus.  Neck: Neck supple.  Cardiovascular: Normal rate, regular rhythm and normal heart sounds.   No murmur heard. Pulmonary/Chest: Effort normal and breath sounds normal. No respiratory distress. She has no wheezes.  Abdominal: Soft. Bowel sounds are normal. She exhibits no distension.  Musculoskeletal: She exhibits edema (mild, bilateral) and tenderness.  She has had all toes removed bilateral feet. She is wearing diabetic shoes.  She has a large callus on the lateral side of right foot.  She has a developing ulcer on the dorsum of right foot over 2nd-3rd metatarsal.  Non tender.   Lymphadenopathy:    She has no cervical adenopathy.  Neurological: She is alert and oriented to person, place, and time.  Skin: Skin is warm and dry. No erythema.  Psychiatric: She has a normal mood and affect. Her behavior is normal.    BMET, CBC, B12, A1C today      Assessment & Plan:  RTC in 6 weeks.

## 2014-11-23 ENCOUNTER — Other Ambulatory Visit: Payer: Self-pay | Admitting: Internal Medicine

## 2014-11-23 ENCOUNTER — Other Ambulatory Visit: Payer: Self-pay | Admitting: Nurse Practitioner

## 2014-11-23 NOTE — Telephone Encounter (Signed)
Patient requesting Thyroid meds to be refilled.

## 2014-11-27 MED ORDER — GLIPIZIDE 10 MG PO TABS: 5.0000 mg | ORAL_TABLET | Freq: Two times a day (BID) | ORAL | Status: DC

## 2014-11-27 MED ORDER — LEVOTHYROXINE SODIUM 112 MCG PO TABS: 112.0000 ug | ORAL_TABLET | Freq: Every day | ORAL | Status: DC

## 2014-11-27 NOTE — Assessment & Plan Note (Signed)
Denies back pain today.

## 2014-11-27 NOTE — Assessment & Plan Note (Signed)
She continues to have some blood with wiping.  No acute issue today.

## 2014-11-27 NOTE — Assessment & Plan Note (Signed)
A1C 6.8.  Given comorbitities, may be reasonable to target A1C of 8 for this patient.  She also had low blood sugar in clinic.    Will call patient and have her decrease her glipizide to 5mg  BID.  We are investigating a way to get her new diabetic shoes.  For her foot ulcers, have asked her to see Dr. Sharol Given, her surgeon.  She does not appear to be acutely infected on exam today, however, these could be developing pressure ulcers.

## 2014-11-27 NOTE — Assessment & Plan Note (Signed)
Normal B12 level on check.  Chronic anemia likely related to her CKD.  Will remove from problem list.

## 2014-11-27 NOTE — Assessment & Plan Note (Signed)
Pulse 94 today, which is controlled.  She is taking Eliquis.  Recent Stroke.

## 2014-11-30 ENCOUNTER — Other Ambulatory Visit: Payer: Self-pay | Admitting: Internal Medicine

## 2014-11-30 DIAGNOSIS — Z8739 Personal history of other diseases of the musculoskeletal system and connective tissue: Secondary | ICD-10-CM

## 2014-11-30 DIAGNOSIS — Z89429 Acquired absence of other toe(s), unspecified side: Secondary | ICD-10-CM

## 2014-12-06 ENCOUNTER — Ambulatory Visit (HOSPITAL_BASED_OUTPATIENT_CLINIC_OR_DEPARTMENT_OTHER): Payer: Medicare Other | Admitting: Hematology

## 2014-12-06 ENCOUNTER — Encounter: Payer: Self-pay | Admitting: Hematology

## 2014-12-06 ENCOUNTER — Other Ambulatory Visit (HOSPITAL_BASED_OUTPATIENT_CLINIC_OR_DEPARTMENT_OTHER): Payer: Medicare Other

## 2014-12-06 ENCOUNTER — Ambulatory Visit: Payer: Medicare Other

## 2014-12-06 ENCOUNTER — Telehealth: Payer: Self-pay | Admitting: Hematology

## 2014-12-06 VITALS — BP 149/71 | HR 110 | Temp 98.2°F | Resp 19 | Ht 66.0 in | Wt 191.1 lb

## 2014-12-06 DIAGNOSIS — D472 Monoclonal gammopathy: Secondary | ICD-10-CM

## 2014-12-06 DIAGNOSIS — D631 Anemia in chronic kidney disease: Secondary | ICD-10-CM

## 2014-12-06 DIAGNOSIS — N183 Chronic kidney disease, stage 3 (moderate): Secondary | ICD-10-CM

## 2014-12-06 DIAGNOSIS — E538 Deficiency of other specified B group vitamins: Secondary | ICD-10-CM

## 2014-12-06 LAB — CBC & DIFF AND RETIC
BASO%: 0.4 % (ref 0.0–2.0)
BASOS ABS: 0 10*3/uL (ref 0.0–0.1)
EOS%: 5 % (ref 0.0–7.0)
Eosinophils Absolute: 0.4 10*3/uL (ref 0.0–0.5)
HEMATOCRIT: 29.7 % — AB (ref 34.8–46.6)
HGB: 9.2 g/dL — ABNORMAL LOW (ref 11.6–15.9)
Immature Retic Fract: 8.5 % (ref 1.60–10.00)
LYMPH#: 1.5 10*3/uL (ref 0.9–3.3)
LYMPH%: 18.3 % (ref 14.0–49.7)
MCH: 27.8 pg (ref 25.1–34.0)
MCHC: 31 g/dL — ABNORMAL LOW (ref 31.5–36.0)
MCV: 89.7 fL (ref 79.5–101.0)
MONO#: 1.1 10*3/uL — ABNORMAL HIGH (ref 0.1–0.9)
MONO%: 13 % (ref 0.0–14.0)
NEUT%: 63.3 % (ref 38.4–76.8)
NEUTROS ABS: 5.2 10*3/uL (ref 1.5–6.5)
Platelets: 124 10*3/uL — ABNORMAL LOW (ref 145–400)
RBC: 3.31 10*6/uL — ABNORMAL LOW (ref 3.70–5.45)
RDW: 15.8 % — ABNORMAL HIGH (ref 11.2–14.5)
RETIC CT ABS: 43.03 10*3/uL (ref 33.70–90.70)
Retic %: 1.3 % (ref 0.70–2.10)
WBC: 8.2 10*3/uL (ref 3.9–10.3)
nRBC: 0 % (ref 0–0)

## 2014-12-06 MED ORDER — DARBEPOETIN ALFA 200 MCG/0.4ML IJ SOSY
200.0000 ug | PREFILLED_SYRINGE | Freq: Once | INTRAMUSCULAR | Status: DC
  Filled 2014-12-06: qty 0.4

## 2014-12-06 NOTE — Telephone Encounter (Signed)
Gave and printed appt sched adna vs for pt for OCT °

## 2014-12-06 NOTE — Progress Notes (Signed)
[facility] OFFICE PROGRESS NOTE  [name], MD [place] [address]  DIAGNOSIS: Anemia, unspecified anemia type  CHIEF COMPLAIN: Follow up MGUS and anemia.  CURRENT THERAPY: Vitamin B12 shots every 8 weeks and aranesp 200 mcg every 4 weeks for a hemoglobin less than or equal to 11, started on 01/09/2013. Her last injections were on 03/08/2014, and restarted on 07/19/2014   PROBLEM LIST:  1. Chronic anemia.  2. Renal insufficiency.  3. Atrial fibrillation.  4. 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  5. Low vitamin B12 level.  6. Osteomyelitis of the left foot status post left mid foot amputation  on 05/30/2010.  7. Status post amputation of toes of right foot in December 2012.  8. Hypertension.  9. Diabetes mellitus.  10.Venous insufficiency.  11.Dyslipidemia.  12.Status post total thyroidectomy for multinodular goiter on  03/13/2006.  13.History of asthma.  14.Thrombocytopenia with variable platelet count first noted 08/2010,  suspect chronic idiopathic thrombocytopenic purpura.  15. 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  INTERVAL HISTORY: [name] 80 y.o. female with a history of chronic kidney disease complicated by anemia and Vit[place] B12 deficiency is here for follow-up.    She was discharged from rehabilitation, she is on home physical therapy now. Overall she has been getting better, left arm and this is improved. She is has moderate but stable fatigue, able to tolerate light activities. She has a home aid who helps her at home. No other new complaints.  MEDICAL HISTORY: Past Medical History  Diagnosis Date  . Hypertension   . Hyperlipidemia   . Chronic venous insufficiency   . Osteomyelitis     s/p Rt 2nd toe and left 5 toes  amputation in 1/12  by Dr. Sharol Given  . Diabetes mellitus   . Anemia     b12 def, iron def, follow at cancer center, gets  B12 and  another injection there. Could not remeber the name. Dr. Ralene Ok is her  cancer doctor  . Thyroid disease     hypothyroidism h/o hyperthyroidism s/p ablation/ectomy  . Asthma   . Dysrhythmia     atrial fibb  . Shortness of breath   . Blood transfusion     two or more yrs ago  . Hypothyroidism     thyroid removed 4 or more yrs ago  . Atrial fibrillation   . Chronic renal insufficiency      ALLERGIES:  has No Known Allergies.  MEDICATIONS: has a current medication list which includes the following prescription(s): apixaban, aspirin, atorvastatin, calcitriol, vitamin d3, docusate sodium, glipizide, glucose blood, januvia, lancets 30g, levothyroxine, lisinopril, loratadine, metoprolol tartrate, pantoprazole, polyethylene glycol, and sennosides-docusate sodium, and the following Facility-Administered Medications: darbepoetin alfa.  SURGICAL HISTORY:  Past Surgical History  Procedure Laterality Date  . Eye surgery      cat ext ou  . Appendectomy      teenager  . Amputation  04/30/2011    Procedure: AMPUTATION FOOT;  Surgeon: Newt Minion, MD;  Location: [facility];  Service: Orthopedics;  Laterality: Right;  Right Midfoot Amputation    REVIEW OF SYSTEMS:   Constitutional: Denies fevers, chills or abnormal weight loss Eyes: Denies blurriness of vision Ears, nose, mouth, throat, and face: Denies mucositis or sore throat Respiratory: Denies cough, dyspnea or wheezes Cardiovascular: Denies palpitation, chest discomfort or lower extremity swelling Gastrointestinal:  Denies nausea, heartburn or change  in bowel habits Skin: Denies abnormal skin rashes Lymphatics: Denies new lymphadenopathy or easy bruising Neurological:Denies numbness, tingling or new weaknesses Behavioral/Psych: Mood is stable, no new changes  All other systems were reviewed with the patient and are negative.  PHYSICAL EXAMINATION: ECOG PERFORMANCE STATUS: 3  Blood pressure 149/71, pulse 110, temperature 98.2  F (36.8 C), temperature source Oral, resp. rate 19, height _0  (1.676 m), weight 191 lb 1.6 oz (86.682 kg), SpO2 100 %.  GENERAL:alert, no distress and comfortable; chronically ill elderly female who uses a walker.  SKIN: skin color, texture, turgor are normal, no rashes or significant lesions EYES: normal, Conjunctiva are pink and non-injected, sclera clear OROPHARYNX:no exudate, no erythema and lips, buccal mucosa, and tongue normal  NECK: supple, thyroid normal size, non-tender, without nodularity LYMPH:  no palpable lymphadenopathy in the cervical, axillary or supraclavicular LUNGS: clear to auscultation and percussion with normal breathing effort HEART: irregular rate & rhythm and no murmurs and 1+ lower extremity edema ABDOMEN:abdomen soft, non-tender and normal bowel sounds Musculoskeletal:no cyanosis of digits and no clubbing; s/p bilateral toes amputations as noted above.  NEURO: alert & oriented x 3 with fluent speech, ambulates via a wheelchair, she has mild left arm weakness, other cranial nerve exam were unremarkable, no focal motor or sensation deficiency.  LABORATORY DATA: CBC Latest Ref Rng 12/06/2014 11/22/2014 10/11/2014  WBC 3.9 - 10.3 10e3/uL 8.2 7.4 8.9  Hemoglobin 11.6 - 15.9 g/dL 9.2(L) 9.7(L) 10.5(L)  Hematocrit 34.8 - 46.6 % 29.7(L) 30.5(L) 33.2(L)  Platelets 145 - 400 10e3/uL 124(L) 145(L) 190    CMP Latest Ref Rng 11/22/2014 10/11/2014 10/02/2014  Glucose 65 - 99 mg/dL 36(LL) 96 94  BUN 6 - 20 mg/dL 44(H) 49.8(H) 40(H)  Creatinine 0.44 - 1.00 mg/dL 2.52(H) 2.7(H) 2.29(H)  Sodium 135 - 145 mmol/L 140 139 138  Potassium 3.5 - 5.1 mmol/L 4.7 5.0 4.6  Chloride 101 - 111 mmol/L 110 - 107  CO2 22 - 32 mmol/L 21(L) 21(L) 22  Calcium 8.9 - 10.3 mg/dL 8.8(L) 8.7 8.5(L)  Total Protein 6.4 - 8.3 g/dL - 7.4 -  Total Bilirubin 0.20 - 1.20 mg/dL - 0.38 -  Alkaline Phos 40 - 150 U/L - 111 -  AST 5 - 34 U/L - 20 -  ALT 0 - 55 U/L - 11 -     RADIOGRAPHIC STUDIES: 1.  Digital screening mammogram on 07/26/2010 was negative.  2. MRI of the right forefoot without IV contrast on 08/20/2010 showed  apparent new soft tissue ulceration distal to the third metatarsal status post prior resection of the third toe. No soft tissue abscess was demonstrated. There was interval resolution of the previously demonstrated marrow edema within the third metatarsal. No evidence for osteomyelitis. There was stable mid foot degenerative changes and chronic myopathic changes.  3. CT of the cervical spine without IV contrast on 10/21/2010 showed  degenerative changes in the cervical spine. No displaced fractures  were identified.  4. CT of the head without IV contrast on 10/21/2010 showed no evidence  for acute intracranial hemorrhage, mass lesion or acute infarct. There was some mild diffuse atrophy. There was a subcutaneous hematoma over the left anterior frontal/periorbital region. No underlying skull fracture or acute intracranial abnormality.  5. Chest x-ray, 2 view, from 04/28/2011 showed stable moderate  cardiomegaly with no active lung disease. There was probable mild  scarring at the lung bases.   ASSESSMENT: [name] 80 y.o. female with a history of multiple comorbidities, including  anemia, IgA MGUS, renal failure, B12 deficiency, etc.  1. Anemia and MGUS, rule out MM  --her anemia has been getting slightly worse over the past year, despite Arenasp injection every 1-2 months. -Given her history of IgA MGUS, renal failure, and worsening anemia, multiple myeloma needs to be ruled out. Her previous M protein level was low, IgA and light chain levels are elevat ed. -Her Skeletal survey was normal.  -I have discussed bone marrow biopsy several times, and she repeatedly declined due to the concern of complications from procedure such as pain. I'll hold on for now. -Her hemoglobin is  9.2 today, anemia started worse than last time, but she is not quite symptomatic. She is  concerned about the potential side effect of ESA, especially stroke, I would hold aranesp today, possible resume when I see her back in 3 month.     2. Right front lobe CVA in 09/2014 -likely related to her AF without anticoagulation .  -She is recovering slowly,  on home PT -She is on a eliquis now   3. Hypertension, diabetes, A. Fib, CKD, and other medical problems -She will continue follow-up with her primary care physician. She is on Eliquis now for A. Fib  All questions were answered. The patient knows to call the clinic with any problems, questions or concerns. We can certainly see the patient much sooner if necessary.  Plan -No Aranesp today -Return to clinic in 3 months, resume Aranesp on next visit   I spent 20 minutes counseling the patient face to face. The total time spent in the appointment was 25 minutes.    Truitt Merle, MD 12/06/2014   11:57 AM

## 2014-12-11 ENCOUNTER — Other Ambulatory Visit: Payer: Self-pay | Admitting: Nurse Practitioner

## 2014-12-12 ENCOUNTER — Emergency Department (HOSPITAL_COMMUNITY): Payer: Medicare Other

## 2014-12-12 ENCOUNTER — Inpatient Hospital Stay (HOSPITAL_COMMUNITY)
Admission: EM | Admit: 2014-12-12 | Discharge: 2014-12-17 | DRG: 378 | Disposition: A | Discharge status: Home / Self Care | Payer: Medicare Other | Attending: Internal Medicine | Admitting: Internal Medicine

## 2014-12-12 ENCOUNTER — Encounter (HOSPITAL_COMMUNITY): Payer: Self-pay | Admitting: Emergency Medicine

## 2014-12-12 DIAGNOSIS — K922 Gastrointestinal hemorrhage, unspecified: Secondary | ICD-10-CM | POA: Diagnosis not present

## 2014-12-12 DIAGNOSIS — K5900 Constipation, unspecified: Secondary | ICD-10-CM | POA: Diagnosis present

## 2014-12-12 DIAGNOSIS — Z89421 Acquired absence of other right toe(s): Secondary | ICD-10-CM | POA: Diagnosis not present

## 2014-12-12 DIAGNOSIS — E785 Hyperlipidemia, unspecified: Secondary | ICD-10-CM | POA: Diagnosis present

## 2014-12-12 DIAGNOSIS — E1122 Type 2 diabetes mellitus with diabetic chronic kidney disease: Secondary | ICD-10-CM | POA: Diagnosis present

## 2014-12-12 DIAGNOSIS — E1140 Type 2 diabetes mellitus with diabetic neuropathy, unspecified: Secondary | ICD-10-CM | POA: Diagnosis present

## 2014-12-12 DIAGNOSIS — K921 Melena: Secondary | ICD-10-CM | POA: Diagnosis present

## 2014-12-12 DIAGNOSIS — D472 Monoclonal gammopathy: Secondary | ICD-10-CM | POA: Diagnosis present

## 2014-12-12 DIAGNOSIS — Z7982 Long term (current) use of aspirin: Secondary | ICD-10-CM | POA: Diagnosis not present

## 2014-12-12 DIAGNOSIS — N184 Chronic kidney disease, stage 4 (severe): Secondary | ICD-10-CM | POA: Diagnosis present

## 2014-12-12 DIAGNOSIS — I129 Hypertensive chronic kidney disease with stage 1 through stage 4 chronic kidney disease, or unspecified chronic kidney disease: Secondary | ICD-10-CM | POA: Diagnosis present

## 2014-12-12 DIAGNOSIS — K317 Polyp of stomach and duodenum: Secondary | ICD-10-CM | POA: Diagnosis present

## 2014-12-12 DIAGNOSIS — D123 Benign neoplasm of transverse colon: Secondary | ICD-10-CM | POA: Diagnosis present

## 2014-12-12 DIAGNOSIS — E039 Hypothyroidism, unspecified: Secondary | ICD-10-CM | POA: Diagnosis present

## 2014-12-12 DIAGNOSIS — K640 First degree hemorrhoids: Secondary | ICD-10-CM | POA: Diagnosis present

## 2014-12-12 DIAGNOSIS — I4891 Unspecified atrial fibrillation: Secondary | ICD-10-CM | POA: Diagnosis present

## 2014-12-12 DIAGNOSIS — Z79899 Other long term (current) drug therapy: Secondary | ICD-10-CM

## 2014-12-12 DIAGNOSIS — N185 Chronic kidney disease, stage 5: Secondary | ICD-10-CM | POA: Diagnosis present

## 2014-12-12 DIAGNOSIS — K5730 Diverticulosis of large intestine without perforation or abscess without bleeding: Secondary | ICD-10-CM | POA: Diagnosis present

## 2014-12-12 DIAGNOSIS — D509 Iron deficiency anemia, unspecified: Secondary | ICD-10-CM | POA: Diagnosis not present

## 2014-12-12 DIAGNOSIS — D62 Acute posthemorrhagic anemia: Secondary | ICD-10-CM | POA: Diagnosis present

## 2014-12-12 DIAGNOSIS — Z89422 Acquired absence of other left toe(s): Secondary | ICD-10-CM | POA: Diagnosis not present

## 2014-12-12 DIAGNOSIS — J45909 Unspecified asthma, uncomplicated: Secondary | ICD-10-CM | POA: Diagnosis present

## 2014-12-12 DIAGNOSIS — Z8673 Personal history of transient ischemic attack (TIA), and cerebral infarction without residual deficits: Secondary | ICD-10-CM | POA: Diagnosis not present

## 2014-12-12 DIAGNOSIS — K449 Diaphragmatic hernia without obstruction or gangrene: Secondary | ICD-10-CM | POA: Diagnosis present

## 2014-12-12 DIAGNOSIS — K2971 Gastritis, unspecified, with bleeding: Principal | ICD-10-CM | POA: Diagnosis present

## 2014-12-12 DIAGNOSIS — D649 Anemia, unspecified: Secondary | ICD-10-CM | POA: Insufficient documentation

## 2014-12-12 DIAGNOSIS — K2970 Gastritis, unspecified, without bleeding: Secondary | ICD-10-CM | POA: Diagnosis not present

## 2014-12-12 DIAGNOSIS — I10 Essential (primary) hypertension: Secondary | ICD-10-CM | POA: Diagnosis present

## 2014-12-12 DIAGNOSIS — I480 Paroxysmal atrial fibrillation: Secondary | ICD-10-CM | POA: Diagnosis present

## 2014-12-12 DIAGNOSIS — Z7901 Long term (current) use of anticoagulants: Secondary | ICD-10-CM | POA: Diagnosis not present

## 2014-12-12 DIAGNOSIS — Z9889 Other specified postprocedural states: Secondary | ICD-10-CM | POA: Diagnosis not present

## 2014-12-12 DIAGNOSIS — D631 Anemia in chronic kidney disease: Secondary | ICD-10-CM | POA: Diagnosis present

## 2014-12-12 HISTORY — DX: Monoclonal gammopathy: D47.2

## 2014-12-12 LAB — CBC WITH DIFFERENTIAL/PLATELET
Basophils Absolute: 0 10*3/uL (ref 0.0–0.1)
Basophils Relative: 0 % (ref 0–1)
EOS PCT: 1 % (ref 0–5)
Eosinophils Absolute: 0.1 10*3/uL (ref 0.0–0.7)
HEMATOCRIT: 16 % — AB (ref 36.0–46.0)
Hemoglobin: 5.1 g/dL — CL (ref 12.0–15.0)
LYMPHS PCT: 19 % (ref 12–46)
Lymphs Abs: 1.4 10*3/uL (ref 0.7–4.0)
MCH: 28.5 pg (ref 26.0–34.0)
MCHC: 31.9 g/dL (ref 30.0–36.0)
MCV: 89.4 fL (ref 78.0–100.0)
MONO ABS: 0.4 10*3/uL (ref 0.1–1.0)
MONOS PCT: 6 % (ref 3–12)
NEUTROS ABS: 5.5 10*3/uL (ref 1.7–7.7)
Neutrophils Relative %: 75 % (ref 43–77)
Platelets: 115 10*3/uL — ABNORMAL LOW (ref 150–400)
RBC: 1.79 MIL/uL — ABNORMAL LOW (ref 3.87–5.11)
RDW: 16.2 % — ABNORMAL HIGH (ref 11.5–15.5)
WBC: 7.4 10*3/uL (ref 4.0–10.5)

## 2014-12-12 LAB — URINALYSIS, ROUTINE W REFLEX MICROSCOPIC
Bilirubin Urine: NEGATIVE
Glucose, UA: NEGATIVE mg/dL
HGB URINE DIPSTICK: NEGATIVE
Ketones, ur: NEGATIVE mg/dL
Nitrite: NEGATIVE
Protein, ur: 30 mg/dL — AB
Specific Gravity, Urine: 1.01 (ref 1.005–1.030)
Urobilinogen, UA: 0.2 mg/dL (ref 0.0–1.0)
pH: 5 (ref 5.0–8.0)

## 2014-12-12 LAB — BASIC METABOLIC PANEL
Anion gap: 11 (ref 5–15)
BUN: 90 mg/dL — ABNORMAL HIGH (ref 6–20)
CALCIUM: 8.3 mg/dL — AB (ref 8.9–10.3)
CO2: 17 mmol/L — AB (ref 22–32)
Chloride: 111 mmol/L (ref 101–111)
Creatinine, Ser: 2.8 mg/dL — ABNORMAL HIGH (ref 0.44–1.00)
GFR calc Af Amer: 18 mL/min — ABNORMAL LOW (ref >60)
GFR calc non Af Amer: 15 mL/min — ABNORMAL LOW (ref >60)
Glucose, Bld: 180 mg/dL — ABNORMAL HIGH (ref 65–99)
Potassium: 4.8 mmol/L (ref 3.5–5.1)
Sodium: 139 mmol/L (ref 135–145)

## 2014-12-12 LAB — PROTIME-INR
INR: 1.9 — ABNORMAL HIGH (ref 0.00–1.49)
PROTHROMBIN TIME: 21.7 s — AB (ref 11.6–15.2)

## 2014-12-12 LAB — URINE MICROSCOPIC-ADD ON

## 2014-12-12 LAB — I-STAT TROPONIN, ED: TROPONIN I, POC: 0.02 ng/mL (ref 0.00–0.08)

## 2014-12-12 LAB — PREPARE RBC (CROSSMATCH)

## 2014-12-12 LAB — APTT: APTT: 32 s (ref 24–37)

## 2014-12-12 MED ORDER — SODIUM CHLORIDE 0.9 % IV SOLN
10.0000 mL/h | Freq: Once | INTRAVENOUS | Status: AC
  Administered 2014-12-12: 10 mL/h via INTRAVENOUS

## 2014-12-12 MED ORDER — ONDANSETRON HCL 4 MG/2ML IJ SOLN: 4.0000 mg | Freq: Three times a day (TID) | INTRAMUSCULAR | Status: AC | PRN

## 2014-12-12 MED ORDER — PANTOPRAZOLE SODIUM 40 MG IV SOLR
40.0000 mg | INTRAVENOUS | Status: DC
  Administered 2014-12-12: 40 mg via INTRAVENOUS
  Filled 2014-12-12: qty 40

## 2014-12-12 MED ORDER — SODIUM CHLORIDE 0.9 % IV SOLN
INTRAVENOUS | Status: DC
  Administered 2014-12-12 – 2014-12-13 (×2): via INTRAVENOUS

## 2014-12-12 MED ORDER — PANTOPRAZOLE SODIUM 40 MG IV SOLR
40.0000 mg | Freq: Two times a day (BID) | INTRAVENOUS | Status: DC
  Administered 2014-12-13 – 2014-12-14 (×3): 40 mg via INTRAVENOUS
  Filled 2014-12-12 (×5): qty 40

## 2014-12-12 NOTE — ED Notes (Signed)
Daughter stated, she woke up today and said she just didn't feel good and usually she never complains about anything. Pt. Stated, I've not had a bowel movement in 2 days.

## 2014-12-12 NOTE — ED Notes (Signed)
Attempted IV x 2 without success, 2nd nurse attempting.

## 2014-12-12 NOTE — Consult Note (Signed)
PULMONARY / CRITICAL CARE MEDICINE   Name: [name] MRN: [id number] DOB: 18-Sep-1934    ADMISSION DATE:  12/12/2014 CONSULTATION DATE:  12/12/2014  REFERRING MD : Dr Reather Converse  CHIEF COMPLAINT:  fatigue  INITIAL PRESENTATION: 80 year old female presenting to [facility] ED 7/26 with complaints of recent confusion and fatigue. In ED hemoglobin was found to be 5.1, down from 9 in January. Complains of dark stool for one week. To get blood in ED, CCM to eval.   STUDIES:    SIGNIFICANT EVENTS:   HISTORY OF PRESENT ILLNESS:  80 year old female with PMH as below, which includes HTN, HLD, PAF on eliqiuis, Osteomyelitis (s/p R and L toe/foot amputations), DM, and CKD. She has had progressive fatigue and episodes of confusion over the past few weeks, and constipation/dark stools for the past week. She presented to ED 7/26 for fatigue and feeling particularly unwell the morning prior to presentation. In ED a CBC was obtained and showed hemoglobin of 5.1. She remained hemodynamically stable despite this. She denied BRBPR, nausea, vomiting.  PCCM was called for eval due to blood loss anemia on NOAC.   PAST MEDICAL HISTORY :   has a past medical history of Hypertension; Hyperlipidemia; Chronic venous insufficiency; Osteomyelitis; Diabetes mellitus; Anemia; Thyroid disease; Asthma; Dysrhythmia; Shortness of breath; Blood transfusion; Hypothyroidism; Atrial fibrillation; and Chronic renal insufficiency.  has past surgical history that includes Eye surgery; Appendectomy; and Amputation (04/30/2011). Prior to Admission medications   Medication Sig Start Date End Date Taking? Authorizing Provider  aspirin EC 81 MG EC tablet Take 1 tablet (81 mg total) by mouth daily. 10/02/14  Yes Milagros Loll, MD  calcitRIOL (ROCALTROL) 0.25 MCG capsule Take 1 capsule by mouth every Monday, Wednesday, and Friday. 04/10/14  Yes Historical Provider, MD  docusate sodium (COLACE) 100 MG capsule Take 100 mg by mouth 2 (two) times  daily.   Yes Historical Provider, MD  ELIQUIS 5 MG TABS tablet Take 5 mg by mouth 2 (two) times daily. 10/28/14  Yes Historical Provider, MD  glipiZIDE (GLUCOTROL) 10 MG tablet Take 0.5 tablets (5 mg total) by mouth 2 (two) times daily before a meal. 11/27/14  Yes [name], MD  JANUVIA 25 MG tablet TAKE 1 TABLET BY MOUTH EVERY DAY 03/28/14  Yes [name], MD  levothyroxine (SYNTHROID, LEVOTHROID) 112 MCG tablet Take 1 tablet (112 mcg total) by mouth daily before breakfast. 11/27/14  Yes [name], MD  lisinopril (PRINIVIL,ZESTRIL) 40 MG tablet TAKE ONE TABLET BY MOUTH ONE TIME DAILY 12/28/13  Yes Bartholomew Crews, MD  loratadine (CLARITIN) 10 MG tablet Take 10 mg by mouth daily. 10/10/14 Daily x 5 days then prn daily   Yes Historical Provider, MD  metoprolol (LOPRESSOR) 25 MG tablet Take 1 tablet (25 mg total) by mouth 2 (two) times daily. 05/03/14  Yes [name], MD  pantoprazole (PROTONIX) 40 MG tablet Take 1 tablet (40 mg total) by mouth daily. 02/02/14  Yes [name], MD  polyethylene glycol (MIRALAX / GLYCOLAX) packet Take 17 g by mouth daily as needed for mild constipation or moderate constipation.    Yes Historical Provider, MD  pravastatin (PRAVACHOL) 20 MG tablet Take 20 mg by mouth daily at 6 PM. 12/09/14  Yes Historical Provider, MD  sennosides-docusate sodium (SENOKOT-S) 8.6-50 MG tablet Take 2 tablets by mouth daily.   Yes Historical Provider, MD  apixaban (ELIQUIS) 5 MG TABS tablet Take 1 tablet (5 mg total) by  mouth 2 (two) times daily. 10/02/14 10/27/14  Milagros Loll, MD  atorvastatin (LIPITOR) 40 MG tablet Take 1 tablet (40 mg total) by mouth daily at 6 PM. Patient not taking: Reported on 12/12/2014 10/02/14   Milagros Loll, MD  Cholecalciferol (VITAMIN D3) 10000 UNITS capsule Take 1 capsule (10,000 Units total) by mouth once a week. Patient not taking: Reported on 12/12/2014 08/04/14   [name], MD   No Known Allergies  FAMILY HISTORY:  indicated  that her mother is deceased. She indicated that only one of her four brothers is alive. She indicated that her daughter is alive. She indicated that her son is alive.  SOCIAL HISTORY:  reports that she has never smoked. She has never used smokeless tobacco. She reports that she does not drink alcohol or use illicit drugs.  REVIEW OF SYSTEMS:   Bolds are positive  Constitutional: weight loss, gain, night sweats, Fevers, chills, fatigue .  HEENT: headaches, Sore throat, sneezing, nasal congestion, post nasal drip, Difficulty swallowing, Tooth/dental problems, visual complaints visual changes, ear ache CV:  chest pain, radiates: ,Orthopnea, PND, swelling in lower extremities, dizziness, palpitations, syncope.  GI  heartburn, indigestion, abdominal pain, nausea, vomiting, diarrhea, change in bowel habits(constipation), loss of appetite, bloody stools.  Resp: cough, productive: , hemoptysis, dyspnea, chest pain, pleuritic.  Skin: rash or itching or icterus GU: dysuria, change in color of urine, urgency or frequency. flank pain, hematuria  MS: joint pain or swelling. decreased range of motion  Psych: change in mood or affect. depression or anxiety.  Neuro: difficulty with speech, weakness, numbness, ataxia    SUBJECTIVE:   VITAL SIGNS: Temp:  [96.1 F (35.6 C)-98.2 F (36.8 C)] 98.2 F (36.8 C) (07/26 1845) Pulse Rate:  [91-127] 102 (07/26 1900) Resp:  [11-17] 12 (07/26 1900) BP: (101-141)/(42-81) 131/67 mmHg (07/26 1900) SpO2:  [97 %-100 %] 100 % (07/26 1900) HEMODYNAMICS:   VENTILATOR SETTINGS:   INTAKE / OUTPUT: No intake or output data in the 24 hours ending 12/12/14 1929  PHYSICAL EXAMINATION: General:  Elderly female in NAD Neuro:  Alert, oriented, non-focal HEENT:  [place]/AT, no JVD, PERRL Cardiovascular: RRR, no MRG Lungs:  Clear bilateral breath sounds Abdomen:  Soft, non-tender, non-distended Musculoskeletal:  No acute deformity or ROM limitation Skin:  Grossly  intact  LABS:  CBC  Recent Labs Lab 12/06/14 1005 12/12/14 1322  WBC 8.2 7.4  HGB 9.2* 5.1*  HCT 29.7* 16.0*  PLT 124* 115*   Coag's  Recent Labs Lab 12/12/14 1322  APTT 32  INR 1.90*   BMET  Recent Labs Lab 12/12/14 1322  NA 139  K 4.8  CL 111  CO2 17*  BUN 90*  CREATININE 2.80*  GLUCOSE 180*   Electrolytes  Recent Labs Lab 12/12/14 1322  CALCIUM 8.3*   Sepsis Markers No results for input(s): LATICACIDVEN, PROCALCITON, O2SATVEN in the last 168 hours. ABG No results for input(s): PHART, PCO2ART, PO2ART in the last 168 hours. Liver Enzymes No results for input(s): AST, ALT, ALKPHOS, BILITOT, ALBUMIN in the last 168 hours. Cardiac Enzymes No results for input(s): TROPONINI, PROBNP in the last 168 hours. Glucose No results for input(s): GLUCAP in the last 168 hours.  Imaging Dg Chest Port 1 View  12/12/2014   CLINICAL DATA:  Lethargy and weakness.  EXAM: PORTABLE CHEST - 1 VIEW  COMPARISON:  04/28/2011 radiographs  FINDINGS: Cardiomegaly identified.  Elevation of the right hemidiaphragm again noted. There is no evidence of focal airspace disease, pulmonary  edema, suspicious pulmonary nodule/mass, pleural effusion, or pneumothorax. No acute bony abnormalities are identified.  IMPRESSION: Cardiomegaly without evidence of active cardiopulmonary disease.   Electronically Signed   By: Margarette Canada M.D.   On: 12/12/2014 16:28     ASSESSMENT / PLAN:  GI bleed Acute blood loss anemia (Hgb 5.1, baseline 9.5) A-fib on eliquis > rate controlled  Discussion: 80 year old female with acute GI bleeding and blood loss anemia as a result. She has remained hemodynamically stable despite Hgb of 5.1. No alterations in mental status. Of course, she is at increased risk of blood loss in setting of chronic NOAC use. Given her relative hemodynamic stability, taking into consideration that there is no proven reversal agent for NOAC, and risks of Eppie Gibson could be very serious for  her, I would opt to not attempt reversal of this. Would recommend PRBC transfusion, continuous protonix infusion, GI consultation, and telemetry monitoring in step-down unit under the direction of the hospitalist team. PCCM will be available should any unforseen complications arise.   Georgann Housekeeper, AGACNP-BC [facility] Pager [phone] or [phone]  12/12/2014 7:46 PM    STAFF NOTE: Linwood Dibbles, MD FACP have personally reviewed patient's available data, including medical history, events of note, physical examination and test results as part of my evaluation. I have discussed with resident/NP and other care providers such as pharmacist, RN and RRT. In addition, I personally evaluated patient and elicited key findings of: no distress, no abdo pain, no active blood noted from rectum, she describes dark blood, if hgb was all acute would look more tachy, shock, getting PRBC now , then cbc q4h, Gi notified, would NOT reverse with agents with this clinical circumstance, clot , cva, and no good studies of reversal agents for this drug, will be gone in 2 half lives, reversal attempts if declines only, BP is wnl, to SDU, call if needed  Lavon Paganini. Titus Mould, MD, [place] Pgr: [facility] 12/12/2014 7:53 PM  ]

## 2014-12-12 NOTE — H&P (Signed)
[facility] History and Physical  LADAJAH SOLTYS KPT:[id number] DOB: 03/04/1935 DOA: 12/12/2014  Referring physician: Marye Round, M.D. PCP: [name], MD   Chief Complaint: Fatigue.  HPI: [name] is a 80 y.o. female with a past medical history of type 2 diabetes chronic kidney disease with, anemia, atrial fibrillation on the Eliquis 5 mg twice a day and aspirin 81 mg daily, hypertension hyperlipidemia history of CVA who presents to the emergency department this morning with the complaints of progressively worse fatigue for the last 2 days. She also reported of being constipated for the past 2 days and reports melena for about a week. Workup reveals in the ER an H&H of 5. 1/16 0.0 which is down from July 20 when he was 9.2/29.7.  When seen the patient was in no acute distress and stated she felt better after the transfusion has been initiated.   Review of Systems:  Constitutional: Positive fatigue. No weight loss, night sweats, Fevers, chills,   HEENT:  No headaches, Difficulty swallowing,Tooth/dental problems,Sore throat,  No sneezing, itching, ear ache, nasal congestion, post nasal drip,  Cardio-vascular:  No chest pain, Orthopnea, PND, swelling in lower extremities, anasarca, dizziness, palpitations  GI:  No heartburn, indigestion, mild abdominal pain, no nausea, no vomiting, no diarrhea, positive constipation and melena, positive of appetite  Resp:  No shortness of breath with exertion or at rest. No excess mucus, no productive cough, No non-productive cough, No coughing up of blood.No change in color of mucus.No wheezing.No chest wall deformity  Skin:  no rash or lesions.  GU:  no dysuria, change in color of urine, no urgency or frequency. No flank pain.  Musculoskeletal:  No joint pain or swelling. No decreased range of motion. No back pain.  Psych:  No change in mood or affect. No depression or anxiety. No memory loss.   Past Medical History  Diagnosis Date   . Hypertension   . Hyperlipidemia   . Chronic venous insufficiency   . Osteomyelitis     s/p Rt 2nd toe and left 5 toes  amputation in 1/12  by Dr. Sharol Given  . Diabetes mellitus   . Anemia     b12 def, iron def, follow at cancer center, gets B12 and  another injection there. Could not remeber the name. Dr. Ralene Ok is her  cancer doctor  . Thyroid disease     hypothyroidism h/o hyperthyroidism s/p ablation/ectomy  . Asthma   . Dysrhythmia     atrial fibb  . Shortness of breath   . Blood transfusion     two or more yrs ago  . Hypothyroidism     thyroid removed 4 or more yrs ago  . Atrial fibrillation   . Chronic renal insufficiency    Past Surgical History  Procedure Laterality Date  . Eye surgery      cat ext ou  . Appendectomy      teenager  . Amputation  04/30/2011    Procedure: AMPUTATION FOOT;  Surgeon: Newt Minion, MD;  Location: [facility];  Service: Orthopedics;  Laterality: Right;  Right Midfoot Amputation   Social History:  reports that she has never smoked. She has never used smokeless tobacco. She reports that she does not drink alcohol or use illicit drugs.  No Known Allergies  Family History  Problem Relation Age of Onset  . Anemia Mother   . HIV Brother   . Cancer Brother   . Colon cancer Mother  rectal    Prior to Admission medications   Medication Sig Start Date End Date Taking? Authorizing Provider  aspirin EC 81 MG EC tablet Take 1 tablet (81 mg total) by mouth daily. 10/02/14  Yes Milagros Loll, MD  calcitRIOL (ROCALTROL) 0.25 MCG capsule Take 1 capsule by mouth every Monday, Wednesday, and Friday. 04/10/14  Yes Historical Provider, MD  docusate sodium (COLACE) 100 MG capsule Take 100 mg by mouth 2 (two) times daily.   Yes Historical Provider, MD  ELIQUIS 5 MG TABS tablet Take 5 mg by mouth 2 (two) times daily. 10/28/14  Yes Historical Provider, MD  glipiZIDE (GLUCOTROL) 10 MG tablet Take 0.5 tablets (5 mg total) by mouth 2 (two) times daily before a  meal. 11/27/14  Yes [name], MD  JANUVIA 25 MG tablet TAKE 1 TABLET BY MOUTH EVERY DAY 03/28/14  Yes [name], MD  levothyroxine (SYNTHROID, LEVOTHROID) 112 MCG tablet Take 1 tablet (112 mcg total) by mouth daily before breakfast. 11/27/14  Yes [name], MD  lisinopril (PRINIVIL,ZESTRIL) 40 MG tablet TAKE ONE TABLET BY MOUTH ONE TIME DAILY 12/28/13  Yes Bartholomew Crews, MD  loratadine (CLARITIN) 10 MG tablet Take 10 mg by mouth daily. 10/10/14 Daily x 5 days then prn daily   Yes Historical Provider, MD  metoprolol (LOPRESSOR) 25 MG tablet Take 1 tablet (25 mg total) by mouth 2 (two) times daily. 05/03/14  Yes [name], MD  pantoprazole (PROTONIX) 40 MG tablet Take 1 tablet (40 mg total) by mouth daily. 02/02/14  Yes [name], MD  polyethylene glycol (MIRALAX / GLYCOLAX) packet Take 17 g by mouth daily as needed for mild constipation or moderate constipation.    Yes Historical Provider, MD  pravastatin (PRAVACHOL) 20 MG tablet Take 20 mg by mouth daily at 6 PM. 12/09/14  Yes Historical Provider, MD  sennosides-docusate sodium (SENOKOT-S) 8.6-50 MG tablet Take 2 tablets by mouth daily.   Yes Historical Provider, MD  apixaban (ELIQUIS) 5 MG TABS tablet Take 1 tablet (5 mg total) by mouth 2 (two) times daily. 10/02/14 10/27/14  Milagros Loll, MD  atorvastatin (LIPITOR) 40 MG tablet Take 1 tablet (40 mg total) by mouth daily at 6 PM. Patient not taking: Reported on 12/12/2014 10/02/14   Milagros Loll, MD  Cholecalciferol (VITAMIN D3) 10000 UNITS capsule Take 1 capsule (10,000 Units total) by mouth once a week. Patient not taking: Reported on 12/12/2014 08/04/14   [name], MD   Physical Exam: Filed Vitals:   12/12/14 1930 12/12/14 1945 12/12/14 2000 12/12/14 2015  BP: 133/52 116/55 126/80 127/51  Pulse: 100 97 97 104  Temp:      TempSrc:      Resp: 11 12 16 15   SpO2: 100% 100% 100% 100%    Wt Readings from Last 3 Encounters:  12/06/14 86.682 kg (191 lb 1.6  oz)  11/22/14 85.866 kg (189 lb 4.8 oz)  10/11/14 81.874 kg (180 lb 8 oz)    General:  Appears calm and comfortable Eyes: PERRL, normal lids, irises & conjunctiva ENT: grossly normal hearing, lips & tongue are dry. Neck: no LAD, masses or thyromegaly Cardiovascular: RRR, no m/r/g. No LE edema. Telemetry: SR, no arrhythmias  Respiratory: CTA bilaterally, no w/r/r. Normal respiratory effort. Abdomen: soft, ntnd Skin: no rash or induration seen on limited exam Musculoskeletal: Bilateral feet toe amputations Psychiatric: grossly normal mood and affect, speech fluent and appropriate Neurologic:  Labs on Admission:  Basic Metabolic Panel:  Recent Labs Lab 12/12/14 1322  NA 139  K 4.8  CL 111  CO2 17  GLUCOSE 180  BUN 90  CREATININE 2.80  CALCIUM 8.3   Liver Function Tests: No results for input(s): AST, ALT, ALKPHOS, BILITOT, PROT, ALBUMIN in the last 168 hours. No results for input(s): LIPASE, AMYLASE in the last 168 hours. No results for input(s): AMMONIA in the last 168 hours. CBC:  Recent Labs Lab 12/06/14 1005 12/12/14 1322  WBC 8.2 7.4  NEUTROABS 5.2 5.5  HGB 9.2 5.1  HCT 29.7 16.0  MCV 89.7 89.4  PLT 124 115   Cardiac Enzymes: No results for input(s): CKTOTAL, CKMB, CKMBINDEX, TROPONINI in the last 168 hours.  BNP (last 3 results) No results for input(s): BNP in the last 8760 hours.  ProBNP (last 3 results) No results for input(s): PROBNP in the last 8760 hours.  CBG: No results for input(s): GLUCAP in the last 168 hours.  Radiological Exams on Admission: Dg Chest Port 1 View  12/12/2014   CLINICAL DATA:  Lethargy and weakness.  EXAM: PORTABLE CHEST - 1 VIEW  COMPARISON:  04/28/2011 radiographs  FINDINGS: Cardiomegaly identified.  Elevation of the right hemidiaphragm again noted. There is no evidence of focal airspace disease, pulmonary edema, suspicious pulmonary nodule/mass, pleural effusion, or pneumothorax. No acute bony abnormalities are  identified.  IMPRESSION: Cardiomegaly without evidence of active cardiopulmonary disease.   Electronically Signed   By: Margarette Canada M.D.   On: 12/12/2014 16:28    EKG: Independently reviewed.  Atrial fibrillation with rapid ventricular response at 111 bpm. Possible Anterior infarct , age undetermined Abnormal ECG  Assessment/Plan Principal Problem:   UGI bleed Active Problems:   Hypothyroidism   Diabetes mellitus with neuropathy   Hyperlipidemia   Essential hypertension   CKD (chronic kidney disease) stage 4, GFR 15-29 ml/min   Atrial fibrillation   Melena   1. The case was presented to the intensivist recommended admission to stepdown, continue transfusion, Pantoprazole every 12 hours and current measures. Should the patient clinical condition worsened and we should reconsult.  2. I'll put the patient on CBG monitoring. She initially remained nothing by mouth until seen by GI. 3. I will hold medications  for the moment and monitor her vital signs closely.         GI and ICU were consulted by the emergency department.   Code Status: Full. DVT Prophylaxis: On Full anticoagulation. Family Communication:  Austin,Rachel Daughter [phone]  Disposition Plan: Home with outpatient follow-up.  Time spent: over 70 minutes  Reubin Milan [facility] Pager [phone]

## 2014-12-12 NOTE — ED Notes (Signed)
zavitz made aware of critical hgb 5.1

## 2014-12-12 NOTE — ED Provider Notes (Signed)
CSN: [id number]     Arrival date & time 12/12/14  1255 History   First MD Initiated Contact with Patient 12/12/14 1448     Chief Complaint  Patient presents with  . Generalized Body Aches  . Constipation     (Consider location/radiation/quality/duration/timing/severity/associated sxs/prior Treatment) HPI [name] is a 80 yo female with past medical hx of DM, CKD with anemia, a-fib, HTN, hyperlipidemia, CVA presenting to the emergency department today with fatigue and not feeling well since this morning.  States that she was feeling a little fatigued last night prior to bed and woke up this morning feeling worse.  CBC in the ED shows H/H of 5.1/16.0.  H/H from July 20 was 9.2/29.7.  Pt reports that she has been constipated x 2 days with thin stools and reports dark stool for approximately 1 week leading up to this ED visit.  Pt denies any bright red blood per rectum, emesis, chest pain, SOB, recent NSAID use, abdominal discomfort, fever, chills.  She does endorse a dry cough.  She is on Eliquis 5mg  BID and asprin 81mg  daily.  Per chart review, she had an unremarkable colonoscopy in 2000 and has a positive family hx of rectal cancer. Past Medical History  Diagnosis Date  . Hypertension   . Hyperlipidemia   . Chronic venous insufficiency   . Osteomyelitis     s/p Rt 2nd toe and left 5 toes  amputation in 1/12  by Dr. Sharol Given  . Diabetes mellitus   . Anemia     b12 def, iron def, follow at cancer center, gets B12 and  another injection there. Could not remeber the name. Dr. Ralene Ok is her  cancer doctor  . Thyroid disease     hypothyroidism h/o hyperthyroidism s/p ablation/ectomy  . Asthma   . Dysrhythmia     atrial fibb  . Shortness of breath   . Blood transfusion     two or more yrs ago  . Hypothyroidism     thyroid removed 4 or more yrs ago  . Atrial fibrillation   . Chronic renal insufficiency    Past Surgical History  Procedure Laterality Date  . Eye surgery      cat ext ou  .  Appendectomy      teenager  . Amputation  04/30/2011    Procedure: AMPUTATION FOOT;  Surgeon: Newt Minion, MD;  Location: [facility];  Service: Orthopedics;  Laterality: Right;  Right Midfoot Amputation   Family History  Problem Relation Age of Onset  . Anemia Mother   . HIV Brother   . Cancer Brother   . Colon cancer Mother     rectal   History  Substance Use Topics  . Smoking status: Never Smoker   . Smokeless tobacco: Never Used  . Alcohol Use: No   OB History    No data available     Review of Systems  Constitutional: Positive for fatigue. Negative for fever and chills.  Respiratory: Positive for cough. Negative for shortness of breath.   Cardiovascular: Negative for chest pain.  Gastrointestinal: Positive for constipation and blood in stool. Negative for nausea, vomiting and diarrhea.  Genitourinary: Negative for dysuria and decreased urine volume.  Neurological: Negative for dizziness and light-headedness.  All other systems reviewed and are negative.     Allergies  Review of patient's allergies indicates no known allergies.  Home Medications   Prior to Admission medications   Medication Sig Start Date End Date Taking? Authorizing Provider  apixaban (ELIQUIS) 5 MG TABS tablet Take 1 tablet (5 mg total) by mouth 2 (two) times daily. 10/02/14 10/27/14  Milagros Loll, MD  aspirin EC 81 MG EC tablet Take 1 tablet (81 mg total) by mouth daily. 10/02/14   Milagros Loll, MD  atorvastatin (LIPITOR) 40 MG tablet Take 1 tablet (40 mg total) by mouth daily at 6 PM. 10/02/14   Milagros Loll, MD  calcitRIOL (ROCALTROL) 0.25 MCG capsule Take 1 capsule by mouth every Monday, Wednesday, and Friday. 04/10/14   Historical Provider, MD  Cholecalciferol (VITAMIN D3) 10000 UNITS capsule Take 1 capsule (10,000 Units total) by mouth once a week. 08/04/14   [name], MD  docusate sodium (COLACE) 100 MG capsule Take 100 mg by mouth 2 (two) times daily.    Historical Provider, MD   glipiZIDE (GLUCOTROL) 10 MG tablet Take 0.5 tablets (5 mg total) by mouth 2 (two) times daily before a meal. 11/27/14   [name], MD  glucose blood test strip Use as instructed 05/03/14   [name], MD  JANUVIA 25 MG tablet TAKE 1 TABLET BY MOUTH EVERY DAY 03/28/14   [name], MD  Lancets 30G MISC Use to check blood sugars as directed 10/03/10   Coralee Pesa, MD  levothyroxine (SYNTHROID, LEVOTHROID) 112 MCG tablet Take 1 tablet (112 mcg total) by mouth daily before breakfast. 11/27/14   [name], MD  lisinopril (PRINIVIL,ZESTRIL) 40 MG tablet TAKE ONE TABLET BY MOUTH ONE TIME DAILY 12/28/13   Bartholomew Crews, MD  loratadine (CLARITIN) 10 MG tablet Take 10 mg by mouth daily. 10/10/14 Daily x 5 days then prn daily    Historical Provider, MD  metoprolol (LOPRESSOR) 25 MG tablet Take 1 tablet (25 mg total) by mouth 2 (two) times daily. 05/03/14   [name], MD  pantoprazole (PROTONIX) 40 MG tablet Take 1 tablet (40 mg total) by mouth daily. 02/02/14   [name], MD  polyethylene glycol (MIRALAX / GLYCOLAX) packet Take 17 g by mouth daily as needed.    Historical Provider, MD  sennosides-docusate sodium (SENOKOT-S) 8.6-50 MG tablet Take 2 tablets by mouth daily.    Historical Provider, MD   BP 124/73 mmHg  Pulse 102  Temp(Src) 96.1 F (35.6 C) (Axillary)  Resp 12  SpO2 100% Physical Exam  Constitutional: She appears well-developed.  HENT:  Head: Normocephalic and atraumatic.  Eyes:  Conjunctival pallor.  Cardiovascular: Intact distal pulses.  An irregular rhythm present. Tachycardia present.   Pulmonary/Chest: Effort normal and breath sounds normal. She has no wheezes. She has no rales.  Abdominal: Soft. There is no tenderness.  Neurological:  She appears lethargic at times during encounter by falling asleep.  Her caregiver in the room says this is normal for her. Strength 5/5 in upper and lower extremities.  CN II-XII grossly intact.  Skin: Skin is dry.  There is pallor.    ED Course  Procedures (including critical care time) Labs Review Labs Reviewed  CBC WITH DIFFERENTIAL/PLATELET - Abnormal; Notable for the following:    RBC 1.79 (*)    Hemoglobin 5.1 (*)    HCT 16.0 (*)    RDW 16.2 (*)    Platelets 115 (*)    All other components within normal limits  BASIC METABOLIC PANEL - Abnormal; Notable for the following:    CO2 17 (*)    Glucose, Bld 180 (*)    BUN 90 (*)  Creatinine, Mosley 2.80 (*)    Calcium 8.3 (*)    GFR calc non Af Amer 15 (*)    GFR calc Af Amer 18 (*)    All other components within normal limits  PROTIME-INR - Abnormal; Notable for the following:    Prothrombin Time 21.7 (*)    INR 1.90 (*)    All other components within normal limits  APTT  URINALYSIS, ROUTINE W REFLEX MICROSCOPIC (NOT AT [facility])  I-STAT TROPOININ, ED  POC OCCULT BLOOD, ED  TYPE AND SCREEN  PREPARE RBC (CROSSMATCH)    Imaging Review Dg Chest Port 1 View  12/12/2014   CLINICAL DATA:  Lethargy and weakness.  EXAM: PORTABLE CHEST - 1 VIEW  COMPARISON:  04/28/2011 radiographs  FINDINGS: Cardiomegaly identified.  Elevation of the right hemidiaphragm again noted. There is no evidence of focal airspace disease, pulmonary edema, suspicious pulmonary nodule/mass, pleural effusion, or pneumothorax. No acute bony abnormalities are identified.  IMPRESSION: Cardiomegaly without evidence of active cardiopulmonary disease.   Electronically Signed   By: Margarette Canada M.D.   On: 12/12/2014 16:28     EKG Interpretation   Date/Time:  Tuesday December 12 2014 13:12:27 EDT Ventricular Rate:  111 PR Interval:    QRS Duration: 100 QT Interval:  348 QTC Calculation: 473 R Axis:   12 Text Interpretation:  Atrial fibrillation with rapid ventricular response  Possible Anterior infarct , age undetermined Abnormal ECG atrial fib with  RVR Confirmed by LITTLE MD, RACHEL [phone]) on 12/12/2014 4:18:57 PM      MDM   Final diagnoses:  None   80 yo female with  past medical hx of DM, CKD with anemia, a-fib, HTN, hyperlipidemia, CVA with fatigue and malaise since this morning.  Patient anticoagulated on Eliquis and aspirin, last colonsocopy in 2000, positive family hx of rectal cancer, no recent NSAID use.  H/H of 5.1/16.0 today compared to 9.2/29.7 on July 20.  Patient is tachycardic but has remained normotensive.  Reports constipation with thin stools last 2 days that was preceded by dark stool for approximately 1 week.  Concern for GI bleed.  BUN 90, creatine 2.80 today.  Will check hemocult, tranfuse 2 units PRBCs.  Pt also reports cough x 3-4 days will get CXR.  Troponin negative, CXR read as cardiomegaly without evidence of active cardiopulmonary disease, PT elevated at 21.7, INR at 1.9, aPTT 32.  Urinalysis pending.  Plan to admit to intensivist for close follow up.    Stacy Ser, DO 12/12/14 [place], MD 12/14/14 [phone]

## 2014-12-13 ENCOUNTER — Encounter (HOSPITAL_COMMUNITY): Payer: Self-pay | Admitting: Physician Assistant

## 2014-12-13 DIAGNOSIS — N189 Chronic kidney disease, unspecified: Secondary | ICD-10-CM

## 2014-12-13 DIAGNOSIS — E785 Hyperlipidemia, unspecified: Secondary | ICD-10-CM

## 2014-12-13 DIAGNOSIS — I4891 Unspecified atrial fibrillation: Secondary | ICD-10-CM

## 2014-12-13 DIAGNOSIS — K922 Gastrointestinal hemorrhage, unspecified: Secondary | ICD-10-CM

## 2014-12-13 DIAGNOSIS — Z8673 Personal history of transient ischemic attack (TIA), and cerebral infarction without residual deficits: Secondary | ICD-10-CM

## 2014-12-13 DIAGNOSIS — I129 Hypertensive chronic kidney disease with stage 1 through stage 4 chronic kidney disease, or unspecified chronic kidney disease: Secondary | ICD-10-CM

## 2014-12-13 DIAGNOSIS — D509 Iron deficiency anemia, unspecified: Secondary | ICD-10-CM

## 2014-12-13 DIAGNOSIS — D62 Acute posthemorrhagic anemia: Secondary | ICD-10-CM

## 2014-12-13 DIAGNOSIS — E1122 Type 2 diabetes mellitus with diabetic chronic kidney disease: Secondary | ICD-10-CM

## 2014-12-13 DIAGNOSIS — Z7901 Long term (current) use of anticoagulants: Secondary | ICD-10-CM

## 2014-12-13 DIAGNOSIS — K921 Melena: Secondary | ICD-10-CM

## 2014-12-13 LAB — COMPREHENSIVE METABOLIC PANEL
ALT: 11 U/L — ABNORMAL LOW (ref 14–54)
AST: 15 U/L (ref 15–41)
Albumin: 2.8 g/dL — ABNORMAL LOW (ref 3.5–5.0)
Alkaline Phosphatase: 46 U/L (ref 38–126)
Anion gap: 8 (ref 5–15)
BILIRUBIN TOTAL: 0.8 mg/dL (ref 0.3–1.2)
BUN: 85 mg/dL — AB (ref 6–20)
CO2: 18 mmol/L — AB (ref 22–32)
Calcium: 8.1 mg/dL — ABNORMAL LOW (ref 8.9–10.3)
Chloride: 114 mmol/L — ABNORMAL HIGH (ref 101–111)
Creatinine, Ser: 2.72 mg/dL — ABNORMAL HIGH (ref 0.44–1.00)
GFR calc Af Amer: 18 mL/min — ABNORMAL LOW (ref >60)
GFR, EST NON AFRICAN AMERICAN: 16 mL/min — AB (ref >60)
Glucose, Bld: 75 mg/dL (ref 65–99)
POTASSIUM: 4.5 mmol/L (ref 3.5–5.1)
Sodium: 140 mmol/L (ref 135–145)
Total Protein: 5.4 g/dL — ABNORMAL LOW (ref 6.5–8.1)

## 2014-12-13 LAB — CBC
HCT: 20.8 % — ABNORMAL LOW (ref 36.0–46.0)
HEMATOCRIT: 26.4 % — AB (ref 36.0–46.0)
HEMOGLOBIN: 6.8 g/dL — AB (ref 12.0–15.0)
Hemoglobin: 8.9 g/dL — ABNORMAL LOW (ref 12.0–15.0)
MCH: 28.7 pg (ref 26.0–34.0)
MCH: 29.9 pg (ref 26.0–34.0)
MCHC: 32.7 g/dL (ref 30.0–36.0)
MCHC: 33.7 g/dL (ref 30.0–36.0)
MCV: 87.8 fL (ref 78.0–100.0)
MCV: 88.6 fL (ref 78.0–100.0)
Platelets: 101 10*3/uL — ABNORMAL LOW (ref 150–400)
Platelets: 108 10*3/uL — ABNORMAL LOW (ref 150–400)
RBC: 2.37 MIL/uL — ABNORMAL LOW (ref 3.87–5.11)
RBC: 2.98 MIL/uL — ABNORMAL LOW (ref 3.87–5.11)
RDW: 16 % — AB (ref 11.5–15.5)
RDW: 16.3 % — AB (ref 11.5–15.5)
WBC: 6.3 10*3/uL (ref 4.0–10.5)
WBC: 6.1 10*3/uL (ref 4.0–10.5)

## 2014-12-13 LAB — GLUCOSE, CAPILLARY
GLUCOSE-CAPILLARY: 115 mg/dL — AB (ref 65–99)
GLUCOSE-CAPILLARY: 158 mg/dL — AB (ref 65–99)
Glucose-Capillary: 109 mg/dL — ABNORMAL HIGH (ref 65–99)
Glucose-Capillary: 105 mg/dL — ABNORMAL HIGH (ref 65–99)

## 2014-12-13 LAB — OCCULT BLOOD X 1 CARD TO LAB, STOOL: FECAL OCCULT BLD: POSITIVE — AB

## 2014-12-13 LAB — PREPARE RBC (CROSSMATCH)

## 2014-12-13 LAB — MRSA PCR SCREENING: MRSA BY PCR: NEGATIVE

## 2014-12-13 MED ORDER — INSULIN ASPART 100 UNIT/ML ~~LOC~~ SOLN
0.0000 [IU] | SUBCUTANEOUS | Status: DC
  Administered 2014-12-13: 2 [IU] via SUBCUTANEOUS
  Administered 2014-12-14: 1 [IU] via SUBCUTANEOUS
  Administered 2014-12-14: 2 [IU] via SUBCUTANEOUS
  Administered 2014-12-14: 1 [IU] via SUBCUTANEOUS
  Administered 2014-12-15: 2 [IU] via SUBCUTANEOUS
  Administered 2014-12-15 (×3): 1 [IU] via SUBCUTANEOUS
  Administered 2014-12-16 (×2): 2 [IU] via SUBCUTANEOUS
  Administered 2014-12-17: 1 [IU] via SUBCUTANEOUS

## 2014-12-13 MED ORDER — SODIUM CHLORIDE 0.9 % IV SOLN
Freq: Once | INTRAVENOUS | Status: AC
  Administered 2014-12-13: 06:00:00 via INTRAVENOUS

## 2014-12-13 NOTE — Consult Note (Signed)
[facility] Consult: 3:21 PM 12/13/2014  LOS: 1 day    Referring Provider: Dr Dareen Piano  Primary Care Physician:  [name], MD Primary Gastroenterologist:  Althia Forts.  Seen once by Dr Fuller Plan in 2000 for colonoscopy     Reason for Consultation:  Anemia and melana.   HPI: [name] is a 80 y.o. female.  NIDDM.  On Eliquis for a fib.  Anemia, s/p bone marrow biopsy 04/2004 (abundant iron stores, ?MGUS).  Followed by hematologist, Dr Burr Medico for anemia/MGUS.  On q 8 week B12 injections and Aranesp every 4 weeks if Hgb <= 11, restarted Aranesp 07/2014. Osteomyelitis, s/p multiple toe and bil forefoot amputations.  Multinodular goiter.  CKD stage 4.   2000 Colonoscopy.  Screening study for hx colon CA in mom, personal hx diarrhea and IDA.  Dr Fuller Plan. Spastic/tortuous colon so only reached mid ascending. Sigmoid polyp: TVA  She has not had colonoscopy since then and she denies previous upper endoscopy.  C/o fatigue, dark stools for 1 week.  Hgb 5.1.  Transfused x 2 PRBCs.  Hgb 7/20: 9.2.   Melenic, FOBT positive stools on exam.  Patient denies use of NSAIDs, but she does take daily low-dose aspirin.  PPI coverage is Protonix 40 mg daily.  Says her appetite is good, her weight is stable. No dysphagia. No nausea, vomiting or anorexia. No dysphagia. No abdominal pain.    Past Medical History  Diagnosis Date  . Hypertension   . Hyperlipidemia   . Chronic venous insufficiency   . Osteomyelitis     s/p Rt 2nd toe and left 5 toes  amputation in 1/12  by Dr. Sharol Given  . Diabetes mellitus   . Anemia     b12 def, iron def, follow at cancer center, gets B12 and  another injection there. Could not remeber the name. Dr. Ralene Ok is her  cancer doctor  . Thyroid disease     hypothyroidism h/o hyperthyroidism s/p  ablation/ectomy  . Asthma   . Blood transfusion     two or more yrs ago  . Hypothyroidism     thyroid removed 4 or more yrs ago  . Atrial fibrillation   . Chronic renal insufficiency   . MGUS (monoclonal gammopathy of unknown significance)     Past Surgical History  Procedure Laterality Date  . Eye surgery      cat ext ou  . Appendectomy      teenager  . Amputation  04/30/2011    Procedure: AMPUTATION FOOT;  Surgeon: Newt Minion, MD;  Location: [place];  Service: Orthopedics;  Laterality: Right;  Right Midfoot Amputation    Prior to Admission medications   Medication Sig Start Date End Date Taking? Authorizing Provider  aspirin EC 81 MG EC tablet Take 1 tablet (81 mg total) by mouth daily. 10/02/14  Yes Milagros Loll, MD  calcitRIOL (ROCALTROL) 0.25 MCG capsule Take 1 capsule by mouth every Monday, Wednesday, and Friday. 04/10/14  Yes Historical Provider, MD  docusate sodium (COLACE) 100 MG capsule Take 100 mg by mouth  2 (two) times daily.   Yes Historical Provider, MD  ELIQUIS 5 MG TABS tablet Take 5 mg by mouth 2 (two) times daily. 10/28/14  Yes Historical Provider, MD  glipiZIDE (GLUCOTROL) 10 MG tablet Take 0.5 tablets (5 mg total) by mouth 2 (two) times daily before a meal. 11/27/14  Yes [name], MD  JANUVIA 25 MG tablet TAKE 1 TABLET BY MOUTH EVERY DAY 03/28/14  Yes [name], MD  levothyroxine (SYNTHROID, LEVOTHROID) 112 MCG tablet Take 1 tablet (112 mcg total) by mouth daily before breakfast. 11/27/14  Yes [name], MD  lisinopril (PRINIVIL,ZESTRIL) 40 MG tablet TAKE ONE TABLET BY MOUTH ONE TIME DAILY 12/28/13  Yes Bartholomew Crews, MD  loratadine (CLARITIN) 10 MG tablet Take 10 mg by mouth daily. 10/10/14 Daily x 5 days then prn daily   Yes Historical Provider, MD  metoprolol (LOPRESSOR) 25 MG tablet Take 1 tablet (25 mg total) by mouth 2 (two) times daily. 05/03/14  Yes [name], MD  pantoprazole (PROTONIX) 40 MG tablet Take 1 tablet (40 mg total) by  mouth daily. 02/02/14  Yes [name], MD  polyethylene glycol (MIRALAX / GLYCOLAX) packet Take 17 g by mouth daily as needed for mild constipation or moderate constipation.    Yes Historical Provider, MD  pravastatin (PRAVACHOL) 20 MG tablet Take 20 mg by mouth daily at 6 PM. 12/09/14  Yes Historical Provider, MD  sennosides-docusate sodium (SENOKOT-S) 8.6-50 MG tablet Take 2 tablets by mouth daily.   Yes Historical Provider, MD  apixaban (ELIQUIS) 5 MG TABS tablet Take 1 tablet (5 mg total) by mouth 2 (two) times daily. 10/02/14 10/27/14  Milagros Loll, MD  atorvastatin (LIPITOR) 40 MG tablet Take 1 tablet (40 mg total) by mouth daily at 6 PM. Patient not taking: Reported on 12/12/2014 10/02/14   Milagros Loll, MD  Cholecalciferol (VITAMIN D3) 10000 UNITS capsule Take 1 capsule (10,000 Units total) by mouth once a week. Patient not taking: Reported on 12/12/2014 08/04/14   [name], MD    Scheduled Meds: . insulin aspart  0-9 Units Subcutaneous 6 times per day  . pantoprazole (PROTONIX) IV  40 mg Intravenous Q12H   Infusions: . sodium chloride 75 mL/hr at 12/13/14 1144   PRN Meds:    Allergies as of 12/12/2014  . (No Known Allergies)    Family History  Problem Relation Age of Onset  . Anemia Mother   . HIV Brother   . Cancer Brother   . Colon cancer Mother     rectal    History   Social History  . Marital Status: Widowed    Spouse Name: N/A  . Number of Children: N/A  . Years of Education: N/A   Occupational History  . Not on file.   Social History Main Topics  . Smoking status: Never Smoker   . Smokeless tobacco: Never Used  . Alcohol Use: No  . Drug Use: No  . Sexual Activity: Not on file   Other Topics Concern  . Not on file   Social History Narrative    REVIEW OF SYSTEMS: Constitutional:  Per HPi.  Tired and weak and SOB at least one week, but much more intense yesterday.  ENT:  No nose bleeds Pulm:  Per HPI CV:  No palpitations, no LE  edema.  GU:  No hematuria, + frequency GI:  Per HPI Heme:  Per HPI   Transfusions:  Patient had transfusions overnight.  She does recall having had previous transfusions but it was a long time ago. I don't see any documentation of transfusions dating back to 2000 when I scan the "media" tab. Neuro:  No headaches, no peripheral tingling or numbness.  No falls. She ambulates with the assistance of a walker. Derm:  No itching, no rash or sores.  Endocrine:  No sweats or chills.   Immunization:  Did not inquire. Travel:  None beyond local counties in last few months.    PHYSICAL EXAM: Vital signs in last 24 hours: Filed Vitals:   12/13/14 1405  BP: 141/52  Pulse: 104  Temp:   Resp: 19   Wt Readings from Last 3 Encounters:  12/12/14 190 lb 11.2 oz (86.5 kg)  12/06/14 191 lb 1.6 oz (86.682 kg)  11/22/14 189 lb 4.8 oz (85.866 kg)    General: Pleasant, chronically ill-appearing, alert and comfortable AAF. Head:  No swelling or asymmetry. No signs of head trauma.  Eyes:  Conjunctiva is pale. No scleral icterus. EOMI. Ears:  Slightly HOH  Nose:  No congestion or discharge. Mouth:  Full dentures in place. Mucosa is moist and clear. Tongue is midline. Neck:  No TMG. No JVD. No masses Lungs:  Some crackles in the bases. No cough. No dyspnea. Overall good breath sounds. Heart: RRR. No MRG. S1/S2 audible. Abdomen:  Soft, NT, ND.  No mass or HSM.  + BS.  Marland Kitchen   Rectal: black, melenic stool in pad, did not perform rectal exam.    Musc/Skeltl: No joint swelling, redness or deformity. Extremities:  No CCE. Forefoot amputations bilaterally.  Neurologic:  Patient oriented 3. Moves all 4 limbs. Limb strength not tested. No tremor. Skin:  No rash. No sores. Patches of vitiligo on the face and extremities. Tattoos:  None Nodes:  No cervical adenopathy. No inguinal adenopathy   Psych:  Pleasant, calm, cooperative.  Intake/Output from previous day: 07/26 0701 - 07/27 0700 In: 461.3  [I.V.:461.3] Out: -  Intake/Output this shift: Total I/O In: 387.5 [Blood:387.5] Out: 300 [Urine:300]  LAB RESULTS:  Recent Labs  12/12/14 1322 12/13/14 0244 12/13/14 1425  WBC 7.4 6.3 6.1  HGB 5.1* 6.8* 8.9*  HCT 16.0* 20.8* 26.4*  PLT 115* 101* 108*   BMET Lab Results  Component Value Date   NA 140 12/13/2014   NA 139 12/12/2014   NA 140 11/22/2014   K 4.5 12/13/2014   K 4.8 12/12/2014   K 4.7 11/22/2014   CL 114* 12/13/2014   CL 111 12/12/2014   CL 110 11/22/2014   CO2 18* 12/13/2014   CO2 17* 12/12/2014   CO2 21* 11/22/2014   GLUCOSE 75 12/13/2014   GLUCOSE 180* 12/12/2014   GLUCOSE 36* 11/22/2014   BUN 85* 12/13/2014   BUN 90* 12/12/2014   BUN 44* 11/22/2014   CREATININE 2.72* 12/13/2014   CREATININE 2.80* 12/12/2014   CREATININE 2.52* 11/22/2014   CALCIUM 8.1* 12/13/2014   CALCIUM 8.3* 12/12/2014   CALCIUM 8.8* 11/22/2014   LFT  Recent Labs  12/13/14 0244  PROT 5.4*  ALBUMIN 2.8*  AST 15  ALT 11*  ALKPHOS 46  BILITOT 0.8   PT/INR Lab Results  Component Value Date   INR 1.90* 12/12/2014   INR 1.23 09/30/2014   INR 1.2 07/07/2011    RADIOLOGY STUDIES: Dg Chest Port 1 View  12/12/2014   CLINICAL DATA:  Lethargy and weakness.  EXAM: PORTABLE CHEST - 1 VIEW  COMPARISON:  04/28/2011 radiographs  FINDINGS: Cardiomegaly identified.  Elevation of the right hemidiaphragm again noted. There is no evidence of focal airspace disease, pulmonary edema, suspicious pulmonary nodule/mass, pleural effusion, or pneumothorax. No acute bony abnormalities are identified.  IMPRESSION: Cardiomegaly without evidence of active cardiopulmonary disease.   Electronically Signed   By: Margarette Canada M.D.   On: 12/12/2014 16:28    ENDOSCOPIC STUDIES: Per HPI.    IMPRESSION:   *  GI bleed, suspect upper GI source  *  Acute blood loss on top of chronic anemia. Patient receives regular injections of B12 and infusions of Aranesp.  Followed by hematology for anemia and  MGUS Hemoglobin improved following transfusion of PRBC x 3.    *  Chronic Eliquis.  On hold, presumed last dose was 7/26.  Hx a fib.   *  CKD.  Stage 4. This certainly contributes to anemia.  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 Has not had colonoscopy screening since then. + Family history of colon cancer in her mother.    PLAN:     *  Plan EGD (appt for 9 AM on 7/29) as Eliquis should be on hold for at least 48 hours prior to procedures.  *  Continue twice a day, IV Protonix for the time being. Probably switch over to oral formulation tomorrow.  We'll allow full liquid diet.  *  Repeat CBC in the morning. Patient does not need every 6 hours CBCs.    Azucena Freed  12/13/2014, 3:21 PM Pager: [phone]     Attending physician's note   I have taken a history, examined the patient and reviewed the chart. I agree with the Advanced Practitioner's note, impression and recommendations. Melena with worsening of chronic anemia in setting of anticoagulation. History of a TVA in 2000 and family history of colon cancer. She did not return to [place] for recommended follow up colonoscopies. Hold Eliquis. Transfusions to keep Hb >8. IV PPI. EGD on 7/29 after Eliquis wash out.   Ladene Artist, MD [facility]

## 2014-12-13 NOTE — Progress Notes (Signed)
2 units PRBC given. Post-transfusion Hemoglobin is 6.8. Baltazar Najjar, NP notified and ordered 1 unit PRBC.

## 2014-12-13 NOTE — Progress Notes (Signed)
Subjective: Patient seen and examined at bedside today. States she is feeling well and does not have any complaints. Denies CP, SOB, lightheadedness, abdominal pain, nausea, or vomiting.   Objective: Vital signs in last 24 hours: Filed Vitals:   12/13/14 0604 12/13/14 0630 12/13/14 0759 12/13/14 0853  BP: 128/69 133/78 143/80 145/75  Pulse: 93 95 100 102  Temp: 97.5 F (36.4 C) 97.4 F (36.3 C) 99 F (37.2 C) 97.2 F (36.2 C)  TempSrc: Oral Oral Oral Oral  Resp: 14 14 14 12   Height:      Weight:      SpO2: 99% 98%  98%   Weight change:   Intake/Output Summary (Last 24 hours) at 12/13/14 1034 Last data filed at 12/13/14 0853  Gross per 24 hour  Intake 848.75 ml  Output      0 ml  Net 848.75 ml   Physical Exam  Constitutional: She is oriented to person, place, and time and well-developed, well-nourished, and in no distress.  HENT:  Head: Normocephalic and atraumatic.  Mouth/Throat: Oropharynx is clear and moist.  Perioral pigmentation   Eyes: EOM are normal. Pupils are equal, round, and reactive to light.  Neck: Normal range of motion. Neck supple. No tracheal deviation present.  Cardiovascular: Normal rate.   Murmur heard. Systolic ejection murmur most pronounced at the aortic valve region   Pulmonary/Chest: Effort normal. No respiratory distress. She has no wheezes.  Bibasilar crackles   Abdominal: Soft. Bowel sounds are normal. She exhibits no distension. There is no tenderness. There is no rebound and no guarding.  Musculoskeletal: Normal range of motion. She exhibits no edema.  Neurological: She is alert and oriented to person, place, and time.  Skin: Skin is warm and dry.  Purpura on right forearm and left thigh.   Psychiatric: Mood and affect normal.    Lab Results: Basic Metabolic Panel:  Recent Labs Lab 12/12/14 1322 12/13/14 0244  NA 139 140  K 4.8 4.5  CL 111 114*  CO2 17* 18*  GLUCOSE 180* 75  BUN 90* 85*  CREATININE 2.80* 2.72*  CALCIUM  8.3* 8.1*   Liver Function Tests:  Recent Labs Lab 12/13/14 0244  AST 15  ALT 11*  ALKPHOS 46  BILITOT 0.8  PROT 5.4*  ALBUMIN 2.8*   CBC:  Recent Labs Lab 12/12/14 1322 12/13/14 0244  WBC 7.4 6.3  NEUTROABS 5.5  --   HGB 5.1* 6.8*  HCT 16.0* 20.8*  MCV 89.4 87.8  PLT 115* 101*   CBG:  Recent Labs Lab 12/13/14 0803  GLUCAP 115*   Coagulation:  Recent Labs Lab 12/12/14 1322  LABPROT 21.7*  INR 1.90*   Urine Drug Screen: Drugs of Abuse     Component Value Date/Time   LABOPIA NONE DETECTED 09/30/2014 1450   COCAINSCRNUR NONE DETECTED 09/30/2014 1450   LABBENZ NONE DETECTED 09/30/2014 1450   AMPHETMU NONE DETECTED 09/30/2014 1450   THCU NONE DETECTED 09/30/2014 1450   LABBARB NONE DETECTED 09/30/2014 1450    Urinalysis:  Recent Labs Lab 12/12/14 1710  COLORURINE YELLOW  LABSPEC 1.010  PHURINE 5.0  GLUCOSEU NEGATIVE  HGBUR NEGATIVE  BILIRUBINUR NEGATIVE  KETONESUR NEGATIVE  PROTEINUR 30*  UROBILINOGEN 0.2  NITRITE NEGATIVE  LEUKOCYTESUR MODERATE*    Micro Results: Recent Results (from the past 240 hour(s))  MRSA PCR Screening     Status: None   Collection Time: 12/13/14  4:16 AM  Result Value Ref Range Status   MRSA by PCR NEGATIVE  NEGATIVE Final    Comment:        The GeneXpert MRSA Assay (FDA approved for NASAL specimens only), is one component of a comprehensive MRSA colonization surveillance program. It is not intended to diagnose MRSA infection nor to guide or monitor treatment for MRSA infections.    Studies/Results: Dg Chest Port 1 View  12/12/2014   CLINICAL DATA:  Lethargy and weakness.  EXAM: PORTABLE CHEST - 1 VIEW  COMPARISON:  04/28/2011 radiographs  FINDINGS: Cardiomegaly identified.  Elevation of the right hemidiaphragm again noted. There is no evidence of focal airspace disease, pulmonary edema, suspicious pulmonary nodule/mass, pleural effusion, or pneumothorax. No acute bony abnormalities are identified.   IMPRESSION: Cardiomegaly without evidence of active cardiopulmonary disease.   Electronically Signed   By: Margarette Canada M.D.   On: 12/12/2014 16:28   Medications: I have reviewed the patient's current medications. Scheduled Meds: . insulin aspart  0-9 Units Subcutaneous 6 times per day  . pantoprazole (PROTONIX) IV  40 mg Intravenous Q12H   Continuous Infusions: . sodium chloride 75 mL/hr at 12/12/14 2351   PRN Meds:.ondansetron (ZOFRAN) IV Assessment/Plan: Principal Problem:   UGI bleed Active Problems:   Hypothyroidism   Diabetes mellitus with neuropathy   Hyperlipidemia   Essential hypertension   CKD (chronic kidney disease) stage 4, GFR 15-29 ml/min   Atrial fibrillation   Melena   Absolute anemia  80 yo F with a PMHx of DM, CKD, anemia, A-fib, HTN, HLD, CVA, osteomyelitis s/p toe amputations bilaterally presenting with a 2-3 months hx of progressive fatigue/weakness and 1-2 week hx of melena.   Anemia Pt states she has been feeling weak for the past 2-3 months, worse yesterday. Hx of melena for the past 1-2 weeks. States she takes Eliquis 5 mg BID and ASA 81 mg QD at home. No recent NSAID use. Upon admission on 7/26, H/H were 5.1/ 16.0. H/H on 6/20 were 9.2/ 29.7. Pulse 91-127, BP 101-141/ 42-81. Plat 115, Ag 11, Scr 2.80, PT 21.7, and INR 1.9. Melena most likely due upper GI bleed because pt is taking both Eliquis and ASA at home. Not likely gastritis because pt denies NSAID use. Not likely esophagitis because she does have dysphagia or odynophagia. Denies loss of appetite or weight loss. Pt received 3 units of blood and reports feeling well.  -F/u CBC -F/u FOBT -PRBCs as needed -Protonix drip  -Hold ASA and Eliquis  -GI has been consulted. Appreciate their recs.  -NS @ 75cc/hr -Monitor vitals closely   DM: CBG 115 today.  -cont ssi  CKD: SCr 2.72 -cont NS @ 75 cc/hr -CBG monitoring   A-fib and Hx of CVA -holding Eliquis in the setting of severe anemia and melena.    HTN Holding home meds for now.  HLD Holding home meds  Code: FULL   Dispo: Disposition is deferred at this time, awaiting improvement of current medical problems.  Anticipated discharge in approximately 1-2 day(s).   The patient does have a current PCP [name], MD) and does need an [facility] hospital follow-up appointment after discharge.  The patient does not have transportation limitations that hinder transportation to clinic appointments.  .Services Needed at time of discharge: Y = Yes, Blank = No PT:   OT:   RN:   Equipment:   Other:     LOS: 1 day   Shela Leff, MD 12/13/2014, 10:34 AM

## 2014-12-13 NOTE — Consult Note (Signed)
PULMONARY / CRITICAL CARE MEDICINE   Name: SOLENE HEREFORD MRN: [id number] DOB: 07-31-1934    ADMISSION DATE:  12/12/2014 CONSULTATION DATE:  12/12/2014  REFERRING MD : Dr Reather Converse  CHIEF COMPLAINT:  fatigue  INITIAL PRESENTATION: 80 year old female presenting to [facility] ED 7/26 with complaints of recent confusion and fatigue. In ED hemoglobin was found to be 5.1, down from 9 in January. Complains of dark stool for one week. To get blood in ED, CCM to eval.   STUDIES:    SIGNIFICANT EVENTS: GI seeing today.  HISTORY OF PRESENT ILLNESS:  80 year old female with PMH as below, which includes HTN, HLD, PAF on eliqiuis, Osteomyelitis (s/p R and L toe/foot amputations), DM, and CKD. She has had progressive fatigue and episodes of confusion over the past few weeks, and constipation/dark stools for the past week. She presented to ED 7/26 for fatigue and feeling particularly unwell the morning prior to presentation. In ED a CBC was obtained and showed hemoglobin of 5.1. She remained hemodynamically stable despite this. She denied BRBPR, nausea, vomiting.  PCCM was called for eval due to blood loss anemia on NOAC.   SUBJECTIVE: No events overnight.  VITAL SIGNS: Temp:  [97.2 F (36.2 C)-99 F (37.2 C)] 98.5 F (36.9 C) (07/27 1151) Pulse Rate:  [91-127] 105 (07/27 1151) Resp:  [11-25] 17 (07/27 1151) BP: (101-158)/(42-93) 158/88 mmHg (07/27 1151) SpO2:  [95 %-100 %] 95 % (07/27 1151) Weight:  [86.5 kg (190 lb 11.2 oz)] 86.5 kg (190 lb 11.2 oz) (07/26 2328) HEMODYNAMICS:   VENTILATOR SETTINGS:   INTAKE / OUTPUT:  Intake/Output Summary (Last 24 hours) at 12/13/14 1403 Last data filed at 12/13/14 0855  Gross per 24 hour  Intake 848.75 ml  Output    300 ml  Net 548.75 ml    PHYSICAL EXAMINATION: General:  Elderly female in NAD Neuro:  Alert, oriented, non-focal Head: [place]/AT EENT:  PERRL, EOM-I and no JVD Cardiovascular: RRR, no MRG Lungs:  Clear bilateral breath sounds Abdomen:  Soft,  non-tender, non-distended Musculoskeletal:  No acute deformity or ROM limitation Skin:  Grossly intact  LABS:  CBC  Recent Labs Lab 12/12/14 1322 12/13/14 0244  WBC 7.4 6.3  HGB 5.1* 6.8*  HCT 16.0* 20.8*  PLT 115* 101*   Coag's  Recent Labs Lab 12/12/14 1322  APTT 32  INR 1.90*   BMET  Recent Labs Lab 12/12/14 1322 12/13/14 0244  NA 139 140  K 4.8 4.5  CL 111 114*  CO2 17* 18*  BUN 90* 85*  CREATININE 2.80* 2.72*  GLUCOSE 180* 75   Electrolytes  Recent Labs Lab 12/12/14 1322 12/13/14 0244  CALCIUM 8.3* 8.1*   Sepsis Markers No results for input(s): LATICACIDVEN, PROCALCITON, O2SATVEN in the last 168 hours. ABG No results for input(s): PHART, PCO2ART, PO2ART in the last 168 hours. Liver Enzymes  Recent Labs Lab 12/13/14 0244  AST 15  ALT 11*  ALKPHOS 46  BILITOT 0.8  ALBUMIN 2.8*   Cardiac Enzymes No results for input(s): TROPONINI, PROBNP in the last 168 hours. Glucose  Recent Labs Lab 12/13/14 0803 12/13/14 1154  GLUCAP 115* 109*    Imaging Dg Chest Port 1 View  12/12/2014   CLINICAL DATA:  Lethargy and weakness.  EXAM: PORTABLE CHEST - 1 VIEW  COMPARISON:  04/28/2011 radiographs  FINDINGS: Cardiomegaly identified.  Elevation of the right hemidiaphragm again noted. There is no evidence of focal airspace disease, pulmonary edema, suspicious pulmonary nodule/mass, pleural effusion, or  pneumothorax. No acute bony abnormalities are identified.  IMPRESSION: Cardiomegaly without evidence of active cardiopulmonary disease.   Electronically Signed   By: Margarette Canada M.D.   On: 12/12/2014 16:28     ASSESSMENT / PLAN:  GI bleed Acute blood loss anemia (Hgb 5.1, baseline 9.5) A-fib on eliquis > rate controlled  Discussion: 80 year old female with acute GI bleeding and blood loss anemia as a result. She has remained hemodynamically stable despite Hgb of 5.1. No alterations in mental status. Of course, she is at increased risk of blood loss in  setting of chronic NOAC use. Given her relative hemodynamic stability, taking into consideration that there is no proven reversal agent for NOAC and risk of reversal is high for CVA with a-fib would not reverse. Would recommend additional PRBC transfusion, continuous protonix infusion, GI consultation, and telemetry monitoring in step-down unit under the direction of the IMTS team. PCCM will sign off, please call back if needed.  Rush Farmer, M.D. [facility] Pulmonary/Critical Care Medicine. Pager: [phone]. After hours pager: [phone].  12/13/2014 2:03 PM

## 2014-12-13 NOTE — Progress Notes (Signed)
Patient seen and examined. Case d/w residents in detail. I agree with findings and plan as documented in Dr. Elon Jester note.  Patient feels better today. No further episodes of melanotic stools today. Hg slightly improved s/p PRBC transfusions. Will monitor CBCs. C/w PPI bid. Hold asa, eliquis given active GI bleed. GI consult called. Patient will likely need EGD - most likely upper GI bleed given melena and increased BUN.   Patient with CKD. Will monitor creatinine but currently near baseline

## 2014-12-14 DIAGNOSIS — Z9889 Other specified postprocedural states: Secondary | ICD-10-CM

## 2014-12-14 DIAGNOSIS — Z79899 Other long term (current) drug therapy: Secondary | ICD-10-CM

## 2014-12-14 DIAGNOSIS — Z7982 Long term (current) use of aspirin: Secondary | ICD-10-CM

## 2014-12-14 DIAGNOSIS — R000 Tachycardia, unspecified: Secondary | ICD-10-CM

## 2014-12-14 LAB — TYPE AND SCREEN
ABO/RH(D): O NEG
Antibody Screen: NEGATIVE
UNIT DIVISION: 0
Unit division: 0
Unit division: 0

## 2014-12-14 LAB — CBC
HCT: 26.2 % — ABNORMAL LOW (ref 36.0–46.0)
HEMATOCRIT: 27.8 % — AB (ref 36.0–46.0)
HEMOGLOBIN: 8.9 g/dL — AB (ref 12.0–15.0)
HEMOGLOBIN: 9 g/dL — AB (ref 12.0–15.0)
MCH: 30.2 pg (ref 26.0–34.0)
MCH: 28.8 pg (ref 26.0–34.0)
MCHC: 34 g/dL (ref 30.0–36.0)
MCHC: 32.4 g/dL (ref 30.0–36.0)
MCV: 88.8 fL (ref 78.0–100.0)
MCV: 89.1 fL (ref 78.0–100.0)
Platelets: 113 10*3/uL — ABNORMAL LOW (ref 150–400)
Platelets: 129 10*3/uL — ABNORMAL LOW (ref 150–400)
RBC: 2.95 MIL/uL — AB (ref 3.87–5.11)
RBC: 3.12 MIL/uL — ABNORMAL LOW (ref 3.87–5.11)
RDW: 16.4 % — AB (ref 11.5–15.5)
RDW: 16.5 % — ABNORMAL HIGH (ref 11.5–15.5)
WBC: 7.4 10*3/uL (ref 4.0–10.5)
WBC: 7.8 10*3/uL (ref 4.0–10.5)

## 2014-12-14 LAB — BASIC METABOLIC PANEL
ANION GAP: 9 (ref 5–15)
BUN: 71 mg/dL — ABNORMAL HIGH (ref 6–20)
CALCIUM: 8.7 mg/dL — AB (ref 8.9–10.3)
CO2: 19 mmol/L — ABNORMAL LOW (ref 22–32)
Chloride: 113 mmol/L — ABNORMAL HIGH (ref 101–111)
Creatinine, Ser: 2.28 mg/dL — ABNORMAL HIGH (ref 0.44–1.00)
GFR calc Af Amer: 23 mL/min — ABNORMAL LOW (ref >60)
GFR calc non Af Amer: 20 mL/min — ABNORMAL LOW (ref >60)
GLUCOSE: 110 mg/dL — AB (ref 65–99)
Potassium: 4.7 mmol/L (ref 3.5–5.1)
Sodium: 141 mmol/L (ref 135–145)

## 2014-12-14 LAB — GLUCOSE, CAPILLARY
GLUCOSE-CAPILLARY: 122 mg/dL — AB (ref 65–99)
GLUCOSE-CAPILLARY: 137 mg/dL — AB (ref 65–99)
GLUCOSE-CAPILLARY: 155 mg/dL — AB (ref 65–99)
GLUCOSE-CAPILLARY: 73 mg/dL (ref 65–99)
Glucose-Capillary: 95 mg/dL (ref 65–99)
Glucose-Capillary: 118 mg/dL — ABNORMAL HIGH (ref 65–99)
Glucose-Capillary: 107 mg/dL — ABNORMAL HIGH (ref 65–99)

## 2014-12-14 MED ORDER — METOPROLOL TARTRATE 25 MG PO TABS
25.0000 mg | ORAL_TABLET | Freq: Two times a day (BID) | ORAL | Status: DC
  Administered 2014-12-14 – 2014-12-17 (×7): 25 mg via ORAL
  Filled 2014-12-14 (×7): qty 1

## 2014-12-14 MED ORDER — PANTOPRAZOLE SODIUM 40 MG PO TBEC
40.0000 mg | DELAYED_RELEASE_TABLET | Freq: Two times a day (BID) | ORAL | Status: DC
  Administered 2014-12-14 – 2014-12-17 (×7): 40 mg via ORAL
  Filled 2014-12-14 (×7): qty 1

## 2014-12-14 MED ORDER — AQUAPHOR EX OINT
TOPICAL_OINTMENT | Freq: Every day | CUTANEOUS | Status: DC | PRN
  Administered 2014-12-16 – 2014-12-17 (×2): via TOPICAL
  Filled 2014-12-14: qty 50

## 2014-12-14 NOTE — Progress Notes (Signed)
Daily Rounding Note  12/14/2014, 12:35 PM  LOS: 2 days   SUBJECTIVE:       No BMs, tolerating fulls.  No abd pain, no nausea  OBJECTIVE:         Vital signs in last 24 hours:    Temp:  [97.8 F (36.6 C)-98.7 F (37.1 C)] 98 F (36.7 C) (07/28 1149) Pulse Rate:  [98-104] 99 (07/28 1052) Resp:  [12-19] 15 (07/28 1149) BP: (139-174)/(52-92) 141/64 mmHg (07/28 1149) SpO2:  [95 %-100 %] 97 % (07/28 1149) Last BM Date: 12/13/14 Filed Weights   12/12/14 2328  Weight: 190 lb 11.2 oz (86.5 kg)   General: pleasnt, looks well   Heart: RRR Chest: clear bil Abdomen: soft, NT, ND.  Active BS  Extremities: no CCE Neuro/Psych:  Pleasant, alert, oriented x 3.  No gross deficits.   Intake/Output from previous day: 07/27 0701 - 07/28 0700 In: 2397.5 [P.O.:360; I.V.:1650; Blood:387.5] Out: 1750 [Urine:1750]  Intake/Output this shift: Total I/O In: 240 [P.O.:240] Out: 850 [Urine:850]  Lab Results:  Recent Labs  12/13/14 0244 12/13/14 1425 12/14/14 0224  WBC 6.3 6.1 7.4  HGB 6.8* 8.9* 8.9*  HCT 20.8* 26.4* 26.2*  PLT 101* 108* 113*   BMET  Recent Labs  12/12/14 1322 12/13/14 0244 12/14/14 0224  NA 139 140 141  K 4.8 4.5 4.7  CL 111 114* 113*  CO2 17* 18* 19*  GLUCOSE 180* 75 110*  BUN 90* 85* 71*  CREATININE 2.80* 2.72* 2.28*  CALCIUM 8.3* 8.1* 8.7*   LFT  Recent Labs  12/13/14 0244  PROT 5.4*  ALBUMIN 2.8*  AST 15  ALT 11*  ALKPHOS 46  BILITOT 0.8   PT/INR  Recent Labs  12/12/14 1322  LABPROT 21.7*  INR 1.90*   Hepatitis Panel No results for input(s): HEPBSAG, HCVAB, HEPAIGM, HEPBIGM in the last 72 hours.  Studies/Results: Dg Chest Port 1 View  12/12/2014   CLINICAL DATA:  Lethargy and weakness.  EXAM: PORTABLE CHEST - 1 VIEW  COMPARISON:  04/28/2011 radiographs  FINDINGS: Cardiomegaly identified.  Elevation of the right hemidiaphragm again noted. There is no evidence of focal  airspace disease, pulmonary edema, suspicious pulmonary nodule/mass, pleural effusion, or pneumothorax. No acute bony abnormalities are identified.  IMPRESSION: Cardiomegaly without evidence of active cardiopulmonary disease.   Electronically Signed   By: Margarette Canada M.D.   On: 12/12/2014 16:28   Scheduled Meds: . insulin aspart  0-9 Units Subcutaneous 6 times per day  . metoprolol tartrate  25 mg Oral BID  . pantoprazole (PROTONIX) IV  40 mg Intravenous Q12H   Continuous Infusions:  PRN Meds:.mineral oil-hydrophilic petrolatum  ASSESMENT:   * GI bleed (melenic stool), suspect upper GI source.  On q 12 IV Protonix.   * Acute blood loss on top of chronic anemia. Patient receives regular injections of B12 and infusions of Aranesp. Followed by hematology for anemia and MGUS Hemoglobin improved following transfusion of PRBC x 3.   * Chronic Eliquis. On hold, presumed last dose was 7/26. Hx a fib.   * CKD. Stage 4. This certainly contributes to anemia.  * Tubulovillous adenoma on colonoscopy in 2000. Has not had colonoscopy screening since then. + Family history of colon cancer in her mother.    PLAN   *  EGD tomorrow 9AM. Pt and dtr aware and agreeable to proceed.    *  Switching to po Protonix.    Azucena Freed  12/14/2014, 12:35 PM Pager: [phone]     Attending physician's note   I have taken an interval history, reviewed the chart and examined the patient. I agree with the Advanced Practitioner's note, impression and recommendations. Melena and ABL on chronic anemia. No recurrent bleeding. Suspected UGI bleed. Eliquis on hold. Continue PPI. Plan for EGD tomorrow.  Pricilla Riffle. Fuller Plan, MD Mar[facility] Regal [phone] pager Mon-Fri 8a-5p [phone] weekends, holidays and 5p-8a or per [facility]

## 2014-12-14 NOTE — Progress Notes (Signed)
Case d/w residents in detail. I agree with findings and plan as documented in Dr. Elon Jester note.  Patient feels well today with no new complaints. She is tolerating clears. Had 2 BMs yesterday which were black. She denies abd pain and has no tenderness on exam. GI recommendations appreciated. She is scheduled for an EGD in AM. Hg appears stable s/p 3 units PRBC. Will recheck CBC today. Hold asa and eliquis. Will d/c IVF for now.

## 2014-12-14 NOTE — Progress Notes (Signed)
Subjective: Patient seen and examined at bedside today. States she is feeling well and does not have any complaints. Denies CP, SOB, lightheadedness, abdominal pain, nausea, or vomiting. Pt states she had 2 bowel movements yesterday. As per nursing staff, it was black-tarry stool.   Objective: Vital signs in last 24 hours: Filed Vitals:   12/13/14 2000 12/14/14 0000 12/14/14 0400 12/14/14 0823  BP: 146/70 154/77 155/87 139/79  Pulse:    104  Temp: 98.7 F (37.1 C) 98.5 F (36.9 C) 98.4 F (36.9 C) 97.8 F (36.6 C)  TempSrc: Oral Oral Oral Oral  Resp: 15 14 14 12   Height:      Weight:      SpO2: 99% 95% 98% 98%   Weight change:   Intake/Output Summary (Last 24 hours) at 12/14/14 1021 Last data filed at 12/14/14 1015  Gross per 24 hour  Intake   2025 ml  Output   2000 ml  Net     25 ml   Physical Exam  Constitutional: She is oriented to person, place, and time and well-developed, well-nourished, and in no distress.  HENT:  Head: Normocephalic and atraumatic.  Mouth/Throat: Oropharynx is clear and moist.  Perioral pigmentation  Eyes: EOM are normal. Pupils are equal, round, and reactive to light.  Neck: Normal range of motion. Neck supple. No tracheal deviation present.  Cardiovascular: Normal rate.  Murmur heard. Systolic ejection murmur most pronounced at the aortic valve region  Pulmonary/Chest: CTAB. Effort normal. No respiratory distress. She has no wheezes.   Abdominal: Soft. Bowel sounds are normal. She exhibits no distension. There is no tenderness. There is no rebound and no guarding.  Musculoskeletal: Normal range of motion. She exhibits no edema.  Neurological: She is alert and oriented to person, place, and time.  Skin: Skin is warm and dry.  Purpura on right forearm and left thigh.  Psychiatric: Mood and affect normal.   Lab Results: Basic Metabolic Panel:  Recent Labs Lab 12/13/14 0244 12/14/14 0224  NA 140 141  K 4.5 4.7  CL 114* 113*    CO2 18* 19*  GLUCOSE 75 110*  BUN 85* 71*  CREATININE 2.72* 2.28*  CALCIUM 8.1* 8.7*   Liver Function Tests:  Recent Labs Lab 12/13/14 0244  AST 15  ALT 11*  ALKPHOS 46  BILITOT 0.8  PROT 5.4*  ALBUMIN 2.8*   CBC:  Recent Labs Lab 12/12/14 1322  12/13/14 1425 12/14/14 0224  WBC 7.4  < > 6.1 7.4  NEUTROABS 5.5  --   --   --   HGB 5.1*  < > 8.9* 8.9*  HCT 16.0*  < > 26.4* 26.2*  MCV 89.4  < > 88.6 88.8  PLT 115*  < > 108* 113*  < > = values in this interval not displayed.  CBG:  Recent Labs Lab 12/13/14 1154 12/13/14 1636 12/13/14 2020 12/14/14 0036 12/14/14 0327 12/14/14 0822  GLUCAP 109* 105* 158* 73 95 118*   Coagulation:  Recent Labs Lab 12/12/14 1322  LABPROT 21.7*  INR 1.90*   Urine Drug Screen: Drugs of Abuse     Component Value Date/Time   LABOPIA NONE DETECTED 09/30/2014 1450   COCAINSCRNUR NONE DETECTED 09/30/2014 1450   LABBENZ NONE DETECTED 09/30/2014 1450   AMPHETMU NONE DETECTED 09/30/2014 1450   THCU NONE DETECTED 09/30/2014 1450   LABBARB NONE DETECTED 09/30/2014 1450   Urinalysis:  Recent Labs Lab 12/12/14 1710  COLORURINE YELLOW  LABSPEC 1.010  PHURINE 5.0  GLUCOSEU NEGATIVE  HGBUR NEGATIVE  BILIRUBINUR NEGATIVE  KETONESUR NEGATIVE  PROTEINUR 30*  UROBILINOGEN 0.2  NITRITE NEGATIVE  LEUKOCYTESUR MODERATE*    Micro Results: Recent Results (from the past 240 hour(s))  MRSA PCR Screening     Status: None   Collection Time: 12/13/14  4:16 AM  Result Value Ref Range Status   MRSA by PCR NEGATIVE NEGATIVE Final    Comment:        The GeneXpert MRSA Assay (FDA approved for NASAL specimens only), is one component of a comprehensive MRSA colonization surveillance program. It is not intended to diagnose MRSA infection nor to guide or monitor treatment for MRSA infections.    Studies/Results: Dg Chest Port 1 View  12/12/2014   CLINICAL DATA:  Lethargy and weakness.  EXAM: PORTABLE CHEST - 1 VIEW  COMPARISON:   04/28/2011 radiographs  FINDINGS: Cardiomegaly identified.  Elevation of the right hemidiaphragm again noted. There is no evidence of focal airspace disease, pulmonary edema, suspicious pulmonary nodule/mass, pleural effusion, or pneumothorax. No acute bony abnormalities are identified.  IMPRESSION: Cardiomegaly without evidence of active cardiopulmonary disease.   Electronically Signed   By: Margarette Canada M.D.   On: 12/12/2014 16:28   Medications: I have reviewed the patient's current medications. Scheduled Meds: . insulin aspart  0-9 Units Subcutaneous 6 times per day  . metoprolol tartrate  25 mg Oral BID  . pantoprazole (PROTONIX) IV  40 mg Intravenous Q12H   Continuous Infusions:  PRN Meds:.mineral oil-hydrophilic petrolatum Assessment/Plan: Principal Problem:   UGI bleed Active Problems:   Hypothyroidism   Diabetes mellitus with neuropathy   Hyperlipidemia   Essential hypertension   CKD (chronic kidney disease) stage 4, GFR 15-29 ml/min   Atrial fibrillation   Melena   Absolute anemia  80 yo F with a PMHx of DM, CKD, anemia, A-fib, HTN, HLD, CVA, osteomyelitis s/p toe amputations bilaterally presenting with a 2-3 months hx of progressive fatigue/weakness and 1-2 week hx of melena.   Anemia Pt states she has been feeling weak for the past 2-3 months, worse yesterday. Hx of melena for the past 1-2 weeks. States she takes Eliquis 5 mg BID and ASA 81 mg QD at home. No recent NSAID use. Upon admission on 7/26, H/H were 5.1/ 16.0. H/H on 6/20 were 9.2/ 29.7. Pulse 91-127, BP 101-141/ 42-81. Plat 115, Ag 11, Scr 2.80, PT 21.7, and INR 1.9. FOBT +. Pt received 3 units of blood yesterday and Hgb went up to 8.9 yesterday and has stayed the same since then. Pt reports feeling well. Denies CP, SOB, lightheadedness, abdominal pain, nausea, or vomiting. However, she had 2 episodes of melena yesterday as per nursing staff. BP stable but tachycardic (pulse 98-105).  -will continue to monitor patient  today in the step down unit. EGD planned for tomorrow 7/29 because Eliquis has to be held for 48 hours as per GI note. Appreciate GI recs.  -BP stable, however, patient is tachycardic. We are restarting her home med Lopressor 25 mg BUD for rate control.  -F/u AM CBC -PRBCs as needed -cont IV Protonix 40 mg q12 -Hold ASA and Eliquis  -d/c NS @ 75cc/hr because patient has good oral intake.  -Monitor vitals closely   DM: CBG 118 today.  -cont ssi -CBG monitoring   CKD: SCr trended down from 2.72 yesterday to 2.28 today.  -d/c NS @ 75 cc/hr because pt tolerating diet.   A-fib and Hx of CVA -holding Eliquis in the setting of severe  anemia and melena.   HTN Holding home meds for now.  HLD Holding home meds  Code: FULL  Dispo: Disposition is deferred at this time, awaiting improvement of current medical problems.  Anticipated discharge in approximately 1-2 day(s).   The patient does have a current PCP [name], MD) and does need an [facility] hospital follow-up appointment after discharge.  The patient does not have transportation limitations that hinder transportation to clinic appointments.  .Services Needed at time of discharge: Y = Yes, Blank = No PT:   OT:   RN:   Equipment:   Other:     LOS: 2 days   Shela Leff, MD 12/14/2014, 10:21 AM

## 2014-12-15 ENCOUNTER — Inpatient Hospital Stay (HOSPITAL_COMMUNITY): Payer: Medicare Other | Admitting: Anesthesiology

## 2014-12-15 ENCOUNTER — Encounter (HOSPITAL_COMMUNITY)
Admission: EM | Disposition: A | Discharge status: Home / Self Care | Payer: Self-pay | Source: Home / Self Care | Attending: Internal Medicine

## 2014-12-15 ENCOUNTER — Encounter (HOSPITAL_COMMUNITY): Payer: Self-pay | Admitting: *Deleted

## 2014-12-15 DIAGNOSIS — K2970 Gastritis, unspecified, without bleeding: Secondary | ICD-10-CM | POA: Insufficient documentation

## 2014-12-15 DIAGNOSIS — K921 Melena: Secondary | ICD-10-CM | POA: Insufficient documentation

## 2014-12-15 HISTORY — PX: ESOPHAGOGASTRODUODENOSCOPY: SHX5428

## 2014-12-15 LAB — BASIC METABOLIC PANEL
ANION GAP: 8 (ref 5–15)
BUN: 57 mg/dL — AB (ref 6–20)
CO2: 21 mmol/L — ABNORMAL LOW (ref 22–32)
Calcium: 8.5 mg/dL — ABNORMAL LOW (ref 8.9–10.3)
Chloride: 109 mmol/L (ref 101–111)
Creatinine, Ser: 2.23 mg/dL — ABNORMAL HIGH (ref 0.44–1.00)
GFR calc Af Amer: 23 mL/min — ABNORMAL LOW (ref >60)
GFR calc non Af Amer: 20 mL/min — ABNORMAL LOW (ref >60)
Glucose, Bld: 100 mg/dL — ABNORMAL HIGH (ref 65–99)
POTASSIUM: 4.6 mmol/L (ref 3.5–5.1)
Sodium: 138 mmol/L (ref 135–145)

## 2014-12-15 LAB — GLUCOSE, CAPILLARY
GLUCOSE-CAPILLARY: 143 mg/dL — AB (ref 65–99)
Glucose-Capillary: 104 mg/dL — ABNORMAL HIGH (ref 65–99)
Glucose-Capillary: 125 mg/dL — ABNORMAL HIGH (ref 65–99)
Glucose-Capillary: 150 mg/dL — ABNORMAL HIGH (ref 65–99)
Glucose-Capillary: 200 mg/dL — ABNORMAL HIGH (ref 65–99)

## 2014-12-15 LAB — CBC
HCT: 23.8 % — ABNORMAL LOW (ref 36.0–46.0)
HCT: 25.8 % — ABNORMAL LOW (ref 36.0–46.0)
HEMOGLOBIN: 7.7 g/dL — AB (ref 12.0–15.0)
HEMOGLOBIN: 8.4 g/dL — AB (ref 12.0–15.0)
MCH: 28.9 pg (ref 26.0–34.0)
MCH: 29.2 pg (ref 26.0–34.0)
MCHC: 32.4 g/dL (ref 30.0–36.0)
MCHC: 32.6 g/dL (ref 30.0–36.0)
MCV: 89.5 fL (ref 78.0–100.0)
MCV: 89.6 fL (ref 78.0–100.0)
PLATELETS: 123 10*3/uL — AB (ref 150–400)
Platelets: 136 10*3/uL — ABNORMAL LOW (ref 150–400)
RBC: 2.66 MIL/uL — ABNORMAL LOW (ref 3.87–5.11)
RBC: 2.88 MIL/uL — ABNORMAL LOW (ref 3.87–5.11)
RDW: 16.4 % — AB (ref 11.5–15.5)
RDW: 16.4 % — AB (ref 11.5–15.5)
WBC: 7.1 10*3/uL (ref 4.0–10.5)
WBC: 7.6 10*3/uL (ref 4.0–10.5)

## 2014-12-15 SURGERY — EGD (ESOPHAGOGASTRODUODENOSCOPY)
Anesthesia: Monitor Anesthesia Care

## 2014-12-15 MED ORDER — FENTANYL CITRATE (PF) 100 MCG/2ML IJ SOLN
INTRAMUSCULAR | Status: DC | PRN
  Administered 2014-12-15: 25 ug via INTRAVENOUS

## 2014-12-15 MED ORDER — LACTATED RINGERS IV SOLN
INTRAVENOUS | Status: DC
  Administered 2014-12-15 (×2): via INTRAVENOUS

## 2014-12-15 MED ORDER — POLYETHYLENE GLYCOL 3350 17 GM/SCOOP PO POWD
1.0000 | Freq: Once | ORAL | Status: AC
  Administered 2014-12-15: 255 g via ORAL
  Filled 2014-12-15: qty 255

## 2014-12-15 MED ORDER — PROPOFOL 10 MG/ML IV BOLUS
INTRAVENOUS | Status: DC | PRN
  Administered 2014-12-15 (×4): 10 mg via INTRAVENOUS

## 2014-12-15 MED ORDER — SODIUM CHLORIDE 0.9 % IV SOLN
INTRAVENOUS | Status: DC
  Administered 2014-12-15: 1000 mL via INTRAVENOUS

## 2014-12-15 MED ORDER — BISACODYL 5 MG PO TBEC
10.0000 mg | DELAYED_RELEASE_TABLET | Freq: Once | ORAL | Status: AC
  Administered 2014-12-15: 10 mg via ORAL
  Filled 2014-12-15: qty 2

## 2014-12-15 NOTE — Interval H&P Note (Signed)
History and Physical Interval Note:  12/15/2014 8:32 AM  [name]  has presented today for surgery, with the diagnosis of GI bleed, anemia.  The various methods of treatment have been discussed with the patient and family. After consideration of risks, benefits and other options for treatment, the patient has consented to  Procedure(s): ESOPHAGOGASTRODUODENOSCOPY (EGD) (N/A) as a surgical intervention .  The patient's history has been reviewed, patient examined, no change in status, stable for surgery.  I have reviewed the patient's chart and labs.  Questions were answered to the patient's satisfaction.     Pricilla Riffle. Fuller Plan

## 2014-12-15 NOTE — H&P (View-Only) (Signed)
[facility] Consult: 3:21 PM 12/13/2014  LOS: 1 day    Referring Provider: Dr Dareen Piano  Primary Care Physician:  [name], MD Primary Gastroenterologist:  Althia Forts.  Seen once by Dr Fuller Plan in 2000 for colonoscopy     Reason for Consultation:  Anemia and melana.   HPI: [name] is a 80 y.o. female.  NIDDM.  On Eliquis for a fib.  Anemia, s/p bone marrow biopsy 04/2004 (abundant iron stores, ?MGUS).  Followed by hematologist, Dr Burr Medico for anemia/MGUS.  On q 8 week B12 injections and Aranesp every 4 weeks if Hgb <= 11, restarted Aranesp 07/2014. Osteomyelitis, s/p multiple toe and bil forefoot amputations.  Multinodular goiter.  CKD stage 4.   2000 Colonoscopy.  Screening study for hx colon CA in mom, personal hx diarrhea and IDA.  Dr Fuller Plan. Spastic/tortuous colon so only reached mid ascending. Sigmoid polyp: TVA  She has not had colonoscopy since then and she denies previous upper endoscopy.  C/o fatigue, dark stools for 1 week.  Hgb 5.1.  Transfused x 2 PRBCs.  Hgb 7/20: 9.2.   Melenic, FOBT positive stools on exam.  Patient denies use of NSAIDs, but she does take daily low-dose aspirin.  PPI coverage is Protonix 40 mg daily.  Says her appetite is good, her weight is stable. No dysphagia. No nausea, vomiting or anorexia. No dysphagia. No abdominal pain.    Past Medical History  Diagnosis Date  . Hypertension   . Hyperlipidemia   . Chronic venous insufficiency   . Osteomyelitis     s/p Rt 2nd toe and left 5 toes  amputation in 1/12  by Dr. Sharol Given  . Diabetes mellitus   . Anemia     b12 def, iron def, follow at cancer center, gets B12 and  another injection there. Could not remeber the name. Dr. Ralene Ok is her  cancer doctor  . Thyroid disease     hypothyroidism h/o hyperthyroidism s/p  ablation/ectomy  . Asthma   . Blood transfusion     two or more yrs ago  . Hypothyroidism     thyroid removed 4 or more yrs ago  . Atrial fibrillation   . Chronic renal insufficiency   . MGUS (monoclonal gammopathy of unknown significance)     Past Surgical History  Procedure Laterality Date  . Eye surgery      cat ext ou  . Appendectomy      teenager  . Amputation  04/30/2011    Procedure: AMPUTATION FOOT;  Surgeon: Newt Minion, MD;  Location: [place];  Service: Orthopedics;  Laterality: Right;  Right Midfoot Amputation    Prior to Admission medications   Medication Sig Start Date End Date Taking? Authorizing Provider  aspirin EC 81 MG EC tablet Take 1 tablet (81 mg total) by mouth daily. 10/02/14  Yes Milagros Loll, MD  calcitRIOL (ROCALTROL) 0.25 MCG capsule Take 1 capsule by mouth every Monday, Wednesday, and Friday. 04/10/14  Yes Historical Provider, MD  docusate sodium (COLACE) 100 MG capsule Take 100 mg by mouth  2 (two) times daily.   Yes Historical Provider, MD  ELIQUIS 5 MG TABS tablet Take 5 mg by mouth 2 (two) times daily. 10/28/14  Yes Historical Provider, MD  glipiZIDE (GLUCOTROL) 10 MG tablet Take 0.5 tablets (5 mg total) by mouth 2 (two) times daily before a meal. 11/27/14  Yes [name], MD  JANUVIA 25 MG tablet TAKE 1 TABLET BY MOUTH EVERY DAY 03/28/14  Yes [name], MD  levothyroxine (SYNTHROID, LEVOTHROID) 112 MCG tablet Take 1 tablet (112 mcg total) by mouth daily before breakfast. 11/27/14  Yes [name], MD  lisinopril (PRINIVIL,ZESTRIL) 40 MG tablet TAKE ONE TABLET BY MOUTH ONE TIME DAILY 12/28/13  Yes Bartholomew Crews, MD  loratadine (CLARITIN) 10 MG tablet Take 10 mg by mouth daily. 10/10/14 Daily x 5 days then prn daily   Yes Historical Provider, MD  metoprolol (LOPRESSOR) 25 MG tablet Take 1 tablet (25 mg total) by mouth 2 (two) times daily. 05/03/14  Yes [name], MD  pantoprazole (PROTONIX) 40 MG tablet Take 1 tablet (40 mg total) by  mouth daily. 02/02/14  Yes [name], MD  polyethylene glycol (MIRALAX / GLYCOLAX) packet Take 17 g by mouth daily as needed for mild constipation or moderate constipation.    Yes Historical Provider, MD  pravastatin (PRAVACHOL) 20 MG tablet Take 20 mg by mouth daily at 6 PM. 12/09/14  Yes Historical Provider, MD  sennosides-docusate sodium (SENOKOT-S) 8.6-50 MG tablet Take 2 tablets by mouth daily.   Yes Historical Provider, MD  apixaban (ELIQUIS) 5 MG TABS tablet Take 1 tablet (5 mg total) by mouth 2 (two) times daily. 10/02/14 10/27/14  Milagros Loll, MD  atorvastatin (LIPITOR) 40 MG tablet Take 1 tablet (40 mg total) by mouth daily at 6 PM. Patient not taking: Reported on 12/12/2014 10/02/14   Milagros Loll, MD  Cholecalciferol (VITAMIN D3) 10000 UNITS capsule Take 1 capsule (10,000 Units total) by mouth once a week. Patient not taking: Reported on 12/12/2014 08/04/14   [name], MD    Scheduled Meds: . insulin aspart  0-9 Units Subcutaneous 6 times per day  . pantoprazole (PROTONIX) IV  40 mg Intravenous Q12H   Infusions: . sodium chloride 75 mL/hr at 12/13/14 1144   PRN Meds:    Allergies as of 12/12/2014  . (No Known Allergies)    Family History  Problem Relation Age of Onset  . Anemia Mother   . HIV Brother   . Cancer Brother   . Colon cancer Mother     rectal    History   Social History  . Marital Status: Widowed    Spouse Name: N/A  . Number of Children: N/A  . Years of Education: N/A   Occupational History  . Not on file.   Social History Main Topics  . Smoking status: Never Smoker   . Smokeless tobacco: Never Used  . Alcohol Use: No  . Drug Use: No  . Sexual Activity: Not on file   Other Topics Concern  . Not on file   Social History Narrative    REVIEW OF SYSTEMS: Constitutional:  Per HPi.  Tired and weak and SOB at least one week, but much more intense yesterday.  ENT:  No nose bleeds Pulm:  Per HPI CV:  No palpitations, no LE  edema.  GU:  No hematuria, + frequency GI:  Per HPI Heme:  Per HPI   Transfusions:  Patient had transfusions overnight.  She does recall having had previous transfusions but it was a long time ago. I don't see any documentation of transfusions dating back to 2000 when I scan the "media" tab. Neuro:  No headaches, no peripheral tingling or numbness.  No falls. She ambulates with the assistance of a walker. Derm:  No itching, no rash or sores.  Endocrine:  No sweats or chills.   Immunization:  Did not inquire. Travel:  None beyond local counties in last few months.    PHYSICAL EXAM: Vital signs in last 24 hours: Filed Vitals:   12/13/14 1405  BP: 141/52  Pulse: 104  Temp:   Resp: 19   Wt Readings from Last 3 Encounters:  12/12/14 190 lb 11.2 oz (86.5 kg)  12/06/14 191 lb 1.6 oz (86.682 kg)  11/22/14 189 lb 4.8 oz (85.866 kg)    General: Pleasant, chronically ill-appearing, alert and comfortable AAF. Head:  No swelling or asymmetry. No signs of head trauma.  Eyes:  Conjunctiva is pale. No scleral icterus. EOMI. Ears:  Slightly HOH  Nose:  No congestion or discharge. Mouth:  Full dentures in place. Mucosa is moist and clear. Tongue is midline. Neck:  No TMG. No JVD. No masses Lungs:  Some crackles in the bases. No cough. No dyspnea. Overall good breath sounds. Heart: RRR. No MRG. S1/S2 audible. Abdomen:  Soft, NT, ND.  No mass or HSM.  + BS.  Marland Kitchen   Rectal: black, melenic stool in pad, did not perform rectal exam.    Musc/Skeltl: No joint swelling, redness or deformity. Extremities:  No CCE. Forefoot amputations bilaterally.  Neurologic:  Patient oriented 3. Moves all 4 limbs. Limb strength not tested. No tremor. Skin:  No rash. No sores. Patches of vitiligo on the face and extremities. Tattoos:  None Nodes:  No cervical adenopathy. No inguinal adenopathy   Psych:  Pleasant, calm, cooperative.  Intake/Output from previous day: 07/26 0701 - 07/27 0700 In: 461.3  [I.V.:461.3] Out: -  Intake/Output this shift: Total I/O In: 387.5 [Blood:387.5] Out: 300 [Urine:300]  LAB RESULTS:  Recent Labs  12/12/14 1322 12/13/14 0244 12/13/14 1425  WBC 7.4 6.3 6.1  HGB 5.1* 6.8* 8.9*  HCT 16.0* 20.8* 26.4*  PLT 115* 101* 108*   BMET Lab Results  Component Value Date   NA 140 12/13/2014   NA 139 12/12/2014   NA 140 11/22/2014   K 4.5 12/13/2014   K 4.8 12/12/2014   K 4.7 11/22/2014   CL 114* 12/13/2014   CL 111 12/12/2014   CL 110 11/22/2014   CO2 18* 12/13/2014   CO2 17* 12/12/2014   CO2 21* 11/22/2014   GLUCOSE 75 12/13/2014   GLUCOSE 180* 12/12/2014   GLUCOSE 36* 11/22/2014   BUN 85* 12/13/2014   BUN 90* 12/12/2014   BUN 44* 11/22/2014   CREATININE 2.72* 12/13/2014   CREATININE 2.80* 12/12/2014   CREATININE 2.52* 11/22/2014   CALCIUM 8.1* 12/13/2014   CALCIUM 8.3* 12/12/2014   CALCIUM 8.8* 11/22/2014   LFT  Recent Labs  12/13/14 0244  PROT 5.4*  ALBUMIN 2.8*  AST 15  ALT 11*  ALKPHOS 46  BILITOT 0.8   PT/INR Lab Results  Component Value Date   INR 1.90* 12/12/2014   INR 1.23 09/30/2014   INR 1.2 07/07/2011    RADIOLOGY STUDIES: Dg Chest Port 1 View  12/12/2014   CLINICAL DATA:  Lethargy and weakness.  EXAM: PORTABLE CHEST - 1 VIEW  COMPARISON:  04/28/2011 radiographs  FINDINGS: Cardiomegaly identified.  Elevation of the right hemidiaphragm again noted. There is no evidence of focal airspace disease, pulmonary edema, suspicious pulmonary nodule/mass, pleural effusion, or pneumothorax. No acute bony abnormalities are identified.  IMPRESSION: Cardiomegaly without evidence of active cardiopulmonary disease.   Electronically Signed   By: Margarette Canada M.D.   On: 12/12/2014 16:28    ENDOSCOPIC STUDIES: Per HPI.    IMPRESSION:   *  GI bleed, suspect upper GI source  *  Acute blood loss on top of chronic anemia. Patient receives regular injections of B12 and infusions of Aranesp.  Followed by hematology for anemia and  MGUS Hemoglobin improved following transfusion of PRBC x 3.    *  Chronic Eliquis.  On hold, presumed last dose was 7/26.  Hx a fib.   *  CKD.  Stage 4. This certainly contributes to anemia.  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 Has not had colonoscopy screening since then. + Family history of colon cancer in her mother.    PLAN:     *  Plan EGD (appt for 9 AM on 7/29) as Eliquis should be on hold for at least 48 hours prior to procedures.  *  Continue twice a day, IV Protonix for the time being. Probably switch over to oral formulation tomorrow.  We'll allow full liquid diet.  *  Repeat CBC in the morning. Patient does not need every 6 hours CBCs.    Azucena Freed  12/13/2014, 3:21 PM Pager: [phone]     Attending physician's note   I have taken a history, examined the patient and reviewed the chart. I agree with the Advanced Practitioner's note, impression and recommendations. Melena with worsening of chronic anemia in setting of anticoagulation. History of a TVA in 2000 and family history of colon cancer. She did not return to [place] for recommended follow up colonoscopies. Hold Eliquis. Transfusions to keep Hb >8. IV PPI. EGD on 7/29 after Eliquis wash out.   Ladene Artist, MD [facility]

## 2014-12-15 NOTE — Anesthesia Postprocedure Evaluation (Signed)
  Anesthesia Post-op Note  Patient: [name]  Procedure(s) Performed: Procedure(s): ESOPHAGOGASTRODUODENOSCOPY (EGD) (N/A)  Patient Location: Endoscopy Unit  Anesthesia Type:MAC  Level of Consciousness: awake  Airway and Oxygen Therapy: Patient Spontanous Breathing  Post-op Pain: none  Post-op Assessment: Post-op Vital signs reviewed, Patient's Cardiovascular Status Stable, Respiratory Function Stable, Patent Airway, No signs of Nausea or vomiting and Pain level controlled              Post-op Vital Signs: Reviewed and stable  Last Vitals:  Filed Vitals:   12/15/14 1300  BP: 159/88  Pulse: 98  Temp: 36.4 C  Resp: 17    Complications: No apparent anesthesia complications

## 2014-12-15 NOTE — Op Note (Signed)
[facility] [place], [id number]   ENDOSCOPY PROCEDURE REPORT  PATIENT: [name], [name]  MR#: [id number] BIRTHDATE: June 17, 1934 , 79  yrs. old GENDER: female ENDOSCOPIST: Ladene Artist, MD, [facility] REFERRED BY:  Aldine Contes, MD PROCEDURE DATE:  12/15/2014 PROCEDURE:  EGD w/ biopsy ASA CLASS:     Class III INDICATIONS:  melena. MEDICATIONS: Monitored anesthesia care and Per Anesthesia TOPICAL ANESTHETIC: none DESCRIPTION OF PROCEDURE: After the risks benefits and alternatives of the procedure were thoroughly explained, informed consent was obtained.  The    endoscope was introduced through the mouth and advanced to the second portion of the duodenum , Without limitations.  The instrument was slowly withdrawn as the mucosa was fully examined.    STOMACH: Moderate gastritis was found in the gastric fundus and gastric body. Severe area were friable with active oozing in 1 area on the anterior wall. Several small polyps noed in the body typical for fundic gland polyps.  Multiple biopsies were performed of the gastritis.   The stomach otherwise appeared normal. ESOPHAGUS: The mucosa of the esophagus appeared normal. DUODENUM: The duodenal mucosa showed no abnormalities in the bulb and 2nd part of the duodenum.  Retroflexed views revealed a small hiatal hernia.   The scope was then withdrawn from the patient and the procedure completed.  COMPLICATIONS: There were no immediate complications.  ENDOSCOPIC IMPRESSION: 1.   Moderate, friable gastritis in the gastric fundus and gastric body; multiple biopsies performed 2.   Several small gastric polyps 3.   Small hiatal hernia  RECOMMENDATIONS: 1.  Await pathology results 2.  Continue PPI 3.  Gastritis is a potential source for GI bleeding in setting of anticoagulation. Will proceed with colonoscopy to evaluate for LGI sources of bleeding.  eSigned:  Ladene Artist, MD, [facility] 12/15/2014  9:46 AM

## 2014-12-15 NOTE — Anesthesia Preprocedure Evaluation (Addendum)
Anesthesia Evaluation  Patient identified by MRN, date of birth, ID band Patient awake    Reviewed: Allergy & Precautions, NPO status , Patient's Chart, lab work & pertinent test results  Airway Mallampati: II  TM Distance: >3 FB Neck ROM: Full    Dental  (+) Edentulous Upper, Edentulous Lower   Pulmonary  breath sounds clear to auscultation        Cardiovascular hypertension, Pt. on medications + Peripheral Vascular Disease and +CHF + dysrhythmias Atrial Fibrillation + Valvular Problems/Murmurs AS and AI Rhythm:Irregular + Systolic murmurs EF 74%, Afib, AS/AI mild on last echo   Neuro/Psych Left sided weakness TIACVA, Residual Symptoms negative psych ROS   GI/Hepatic Neg liver ROS,   Endo/Other  diabetes, Type 2, Oral Hypoglycemic AgentsHypothyroidism Morbid obesity  Renal/GU Renal InsufficiencyRenal disease     Musculoskeletal   Abdominal   Peds  Hematology   Anesthesia Other Findings   Reproductive/Obstetrics                            Anesthesia Physical Anesthesia Plan  ASA: III  Anesthesia Plan: MAC   Post-op Pain Management:    Induction: Intravenous  Airway Management Planned: Nasal Cannula  Additional Equipment: None  Intra-op Plan:   Post-operative Plan:   Informed Consent: I have reviewed the patients History and Physical, chart, labs and discussed the procedure including the risks, benefits and alternatives for the proposed anesthesia with the patient or authorized representative who has indicated his/her understanding and acceptance.     Plan Discussed with: CRNA and Surgeon  Anesthesia Plan Comments:         Anesthesia Quick Evaluation

## 2014-12-15 NOTE — Transfer of Care (Signed)
Immediate Anesthesia Transfer of Care Note  Patient: [name]  Procedure(s) Performed: Procedure(s): ESOPHAGOGASTRODUODENOSCOPY (EGD) (N/A)  Patient Location: PACU and Endoscopy Unit  Anesthesia Type:MAC  Level of Consciousness: awake, alert , oriented and sedated  Airway & Oxygen Therapy: Patient Spontanous Breathing and Patient connected to nasal cannula oxygen  Post-op Assessment: Report given to RN, Post -op Vital signs reviewed and stable and Patient moving all extremities  Post vital signs: Reviewed and stable  Last Vitals:  Filed Vitals:   12/15/14 0825  BP: 150/74  Pulse: 107  Temp: 36.6 C  Resp: 18    Complications: No apparent anesthesia complications

## 2014-12-15 NOTE — Care Management Important Message (Signed)
Important Message  Patient Details  Name: [name] MRN: [id number] Date of Birth: 11-25-1934   Medicare Important Message Given:  Yes-second notification given    Pricilla Handler 12/15/2014, 12:23 PM

## 2014-12-15 NOTE — Progress Notes (Signed)
Subjective: Patient seen and examined at bedside today. States she is feeling well and does not have any complaints. Denies CP, SOB, lightheadedness, abdominal pain, nausea, or vomiting. As per nursing staff, patient has had 3 episodes of melena since yesterday.    Objective: Vital signs in last 24 hours: Filed Vitals:   12/15/14 0800 12/15/14 0825 12/15/14 0944 12/15/14 0955  BP: 139/63 150/74 113/44 127/46  Pulse: 88 107  99  Temp:  97.9 F (36.6 C) 97.8 F (36.6 C)   TempSrc:  Oral Oral   Resp: 18 18 12 15   Height:      Weight:      SpO2: 96% 98% 97% 98%   Weight change:   Intake/Output Summary (Last 24 hours) at 12/15/14 1240 Last data filed at 12/15/14 0944  Gross per 24 hour  Intake    290 ml  Output   1350 ml  Net  -1060 ml   Physical Exam  Constitutional: She is oriented to person, place, and time and well-developed, well-nourished, and in no distress.  HENT:  Head: Normocephalic and atraumatic.  Mouth/Throat: Oropharynx is clear and moist.  Perioral pigmentation  Eyes: EOM are normal. Pupils are equal, round, and reactive to light.  Neck: Normal range of motion. Neck supple. No tracheal deviation present.  Cardiovascular: Normal rate.  Murmur heard. Systolic ejection murmur most pronounced at the aortic valve region  Pulmonary/Chest: CTAB. Effort normal. No respiratory distress. She has no wheezes.   Abdominal: Soft. Bowel sounds are normal. She exhibits no distension. There is no tenderness. There is no rebound and no guarding.  Musculoskeletal: Normal range of motion. She exhibits no edema.  Neurological: She is alert and oriented to person, place, and time.  Skin: Skin is warm and dry.  Purpura on right forearm and left thigh.  Psychiatric: Mood and affect normal.   Lab Results: Basic Metabolic Panel:  Recent Labs Lab 12/14/14 0224 12/15/14 0223  NA 141 138  K 4.7 4.6  CL 113* 109  CO2 19* 21*  GLUCOSE 110* 100*  BUN 71* 57*    CREATININE 2.28* 2.23*  CALCIUM 8.7* 8.5*   Liver Function Tests:  Recent Labs Lab 12/13/14 0244  AST 15  ALT 11*  ALKPHOS 46  BILITOT 0.8  PROT 5.4*  ALBUMIN 2.8*   CBC:  Recent Labs Lab 12/12/14 1322  12/14/14 1738 12/15/14 0223  WBC 7.4  < > 7.8 7.1  NEUTROABS 5.5  --   --   --   HGB 5.1*  < > 9.0* 7.7*  HCT 16.0*  < > 27.8* 23.8*  MCV 89.4  < > 89.1 89.5  PLT 115*  < > 129* 123*  < > = values in this interval not displayed.  CBG:  Recent Labs Lab 12/14/14 1151 12/14/14 1602 12/14/14 2029 12/14/14 2324 12/15/14 0345 12/15/14 1020  GLUCAP 155* 122* 107* 137* 104* 125*   Coagulation:  Recent Labs Lab 12/12/14 1322  LABPROT 21.7*  INR 1.90*   Urine Drug Screen: Drugs of Abuse     Component Value Date/Time   LABOPIA NONE DETECTED 09/30/2014 1450   COCAINSCRNUR NONE DETECTED 09/30/2014 1450   LABBENZ NONE DETECTED 09/30/2014 1450   AMPHETMU NONE DETECTED 09/30/2014 1450   THCU NONE DETECTED 09/30/2014 1450   LABBARB NONE DETECTED 09/30/2014 1450    Urinalysis:  Recent Labs Lab 12/12/14 1710  COLORURINE YELLOW  LABSPEC 1.010  PHURINE 5.0  GLUCOSEU NEGATIVE  HGBUR NEGATIVE  BILIRUBINUR NEGATIVE  KETONESUR NEGATIVE  PROTEINUR 30*  UROBILINOGEN 0.2  NITRITE NEGATIVE  LEUKOCYTESUR MODERATE*    Micro Results: Recent Results (from the past 240 hour(s))  MRSA PCR Screening     Status: None   Collection Time: 12/13/14  4:16 AM  Result Value Ref Range Status   MRSA by PCR NEGATIVE NEGATIVE Final    Comment:        The GeneXpert MRSA Assay (FDA approved for NASAL specimens only), is one component of a comprehensive MRSA colonization surveillance program. It is not intended to diagnose MRSA infection nor to guide or monitor treatment for MRSA infections.    Studies/Results: No results found. Medications: I have reviewed the patient's current medications. Scheduled Meds: . bisacodyl  10 mg Oral Once  . insulin aspart  0-9 Units  Subcutaneous 6 times per day  . metoprolol tartrate  25 mg Oral BID  . pantoprazole  40 mg Oral BID  . polyethylene glycol powder  1 Container Oral Once   Continuous Infusions: . sodium chloride    . lactated ringers 50 mL/hr at 12/15/14 0832   PRN Meds:.mineral oil-hydrophilic petrolatum Assessment/Plan: Principal Problem:   UGI bleed Active Problems:   Hypothyroidism   Diabetes mellitus with neuropathy   Hyperlipidemia   Essential hypertension   CKD (chronic kidney disease) stage 4, GFR 15-29 ml/min   Atrial fibrillation   Melena   Absolute anemia   Gastrointestinal hemorrhage with melena   Gastritis  80 yo F with a PMHx of DM, CKD, anemia, A-fib, HTN, HLD, CVA, osteomyelitis s/p toe amputations bilaterally presenting with a 2-3 months hx of progressive fatigue/weakness and 1-2 week hx of melena.   Anemia Pt states she has been feeling weak for the past 2-3 months, worse yesterday. Hx of melena for the past 1-2 weeks. States she takes Eliquis 5 mg BID and ASA 81 mg QD at home. No recent NSAID use. Upon admission on 7/26, H/H were 5.1/ 16.0. H/H on 6/20 were 9.2/ 29.7. Pulse 91-127, BP 101-141/ 42-81. Plat 115, Ag 11, Scr 2.80, PT 21.7, and INR 1.9. FOBT +. Pt received 3 units on 7/27 and Hgb went up to 8.9. Despite having 2 episodes of melena yesterday her Hgb remained stable at 9.0. She reports feeling well today. Denies CP, SOB, lightheadedness, abdominal pain, nausea, or vomiting. However, she had 3 episodes of melena yesterday as per nursing staff. Hgb dropped from 9.0 to 7.7 today. Vitals stable at present, no hypotension or tachycardia. -EGD today revealed moderate, friable gastritis in the gastric fundus and gastric body. Several small gastric polyps and a small hiatal hernia. Multiple biopsies performed, awaiting pathology results. As per endoscopist Dr. Fuller Plan, gastritis is a potential source for Gi bleeding in the setting of anticoagulation. They are proceeding with a  colonoscopy tomorrow to evaluate for LGI sources of bleeding.  -will continue to monitor patient today in the step down unit. -Appreciate GI recs and f/u colonoscopy results  -Vitals stable. Cont home med Lopressor 25 mg BID for rate control.  -F/u CBC at 8 pm tonight -PRBCs if Hgb <7 -discontinue IV Protonix and switch to oral 40 mg BID as per GI recommendation  -cont to hold ASA and Eliquis  -d/c NS @ 75cc/hr because patient has good oral intake.  -Monitor vitals closely   DM: CBG 104 today.  -cont ssi -CBG monitoring   CKD: SCr trended down from 2.72 on admission to 2.23 today.  -d/c NS @ 75 cc/hr yesterday because pt tolerating  diet.  -Encourage po intake.   A-fib and Hx of CVA -holding Eliquis in the setting of severe anemia and melena.   HTN Holding home meds for now.  HLD Holding home meds  Code: FULL  Dispo: Disposition is deferred at this time, awaiting improvement of current medical problems.  Anticipated discharge in approximately 1-2 day(s).   The patient does have a current PCP [name], MD) and does need an [facility] hospital follow-up appointment after discharge.  The patient does have transportation limitations that hinder transportation to clinic appointments.  .Services Needed at time of discharge: Y = Yes, Blank = No PT:   OT:   RN:   Equipment:   Other:     LOS: 3 days   Shela Leff, MD 12/15/2014, 12:40 PM

## 2014-12-15 NOTE — Progress Notes (Signed)
Patient seen and examined. Case d/w residents in detail. I agree with findings and plan as documented in Dr. Elon Jester note in detail.  Patient feels well. Tolerating clears PO. Still with melena - 3 episodes since yesterday. Hg decreased to 7.7 today. Will monitor in SDU. EGD with gastritis. Patient scheduled for colonoscopy in AM. Switch PPI to PO. Continue to hold anticoagulation. Possible d/c over weekend if improves

## 2014-12-16 ENCOUNTER — Encounter (HOSPITAL_COMMUNITY): Payer: Self-pay

## 2014-12-16 ENCOUNTER — Encounter (HOSPITAL_COMMUNITY)
Admission: EM | Disposition: A | Discharge status: Home / Self Care | Payer: Self-pay | Source: Home / Self Care | Attending: Internal Medicine

## 2014-12-16 LAB — CBC
HCT: 27.5 % — ABNORMAL LOW (ref 36.0–46.0)
HEMOGLOBIN: 8.9 g/dL — AB (ref 12.0–15.0)
MCH: 29.2 pg (ref 26.0–34.0)
MCHC: 32.4 g/dL (ref 30.0–36.0)
MCV: 90.2 fL (ref 78.0–100.0)
Platelets: 145 10*3/uL — ABNORMAL LOW (ref 150–400)
RBC: 3.05 MIL/uL — AB (ref 3.87–5.11)
RDW: 16.4 % — ABNORMAL HIGH (ref 11.5–15.5)
WBC: 8.2 10*3/uL (ref 4.0–10.5)

## 2014-12-16 LAB — BASIC METABOLIC PANEL
ANION GAP: 9 (ref 5–15)
BUN: 49 mg/dL — ABNORMAL HIGH (ref 6–20)
CO2: 20 mmol/L — AB (ref 22–32)
Calcium: 8.4 mg/dL — ABNORMAL LOW (ref 8.9–10.3)
Chloride: 106 mmol/L (ref 101–111)
Creatinine, Ser: 2.16 mg/dL — ABNORMAL HIGH (ref 0.44–1.00)
GFR calc Af Amer: 24 mL/min — ABNORMAL LOW (ref >60)
GFR calc non Af Amer: 21 mL/min — ABNORMAL LOW (ref >60)
Glucose, Bld: 147 mg/dL — ABNORMAL HIGH (ref 65–99)
Potassium: 4.9 mmol/L (ref 3.5–5.1)
SODIUM: 135 mmol/L (ref 135–145)

## 2014-12-16 LAB — GLUCOSE, CAPILLARY
GLUCOSE-CAPILLARY: 191 mg/dL — AB (ref 65–99)
GLUCOSE-CAPILLARY: 96 mg/dL (ref 65–99)
Glucose-Capillary: 119 mg/dL — ABNORMAL HIGH (ref 65–99)
Glucose-Capillary: 120 mg/dL — ABNORMAL HIGH (ref 65–99)
Glucose-Capillary: 197 mg/dL — ABNORMAL HIGH (ref 65–99)
Glucose-Capillary: 95 mg/dL (ref 65–99)

## 2014-12-16 SURGERY — CANCELLED PROCEDURE

## 2014-12-16 MED ORDER — PEG-KCL-NACL-NASULF-NA ASC-C 100 G PO SOLR: 1.0000 | Freq: Once | ORAL | Status: DC

## 2014-12-16 MED ORDER — FENTANYL CITRATE (PF) 100 MCG/2ML IJ SOLN
INTRAMUSCULAR | Status: AC
  Filled 2014-12-16: qty 2

## 2014-12-16 MED ORDER — SODIUM CHLORIDE 0.9 % IV SOLN
INTRAVENOUS | Status: DC
  Administered 2014-12-16: 03:00:00 via INTRAVENOUS

## 2014-12-16 MED ORDER — PEG 3350-KCL-NABCB-NACL-NASULF 236 G PO SOLR
2000.0000 mL | Freq: Once | ORAL | Status: AC
  Administered 2014-12-17: 2000 mL via ORAL
  Filled 2014-12-16: qty 4000

## 2014-12-16 MED ORDER — MIDAZOLAM HCL 5 MG/ML IJ SOLN
INTRAMUSCULAR | Status: AC
Start: 2014-12-16 — End: 2014-12-16
  Filled 2014-12-16: qty 2

## 2014-12-16 MED ORDER — PEG 3350-KCL-NABCB-NACL-NASULF 236 G PO SOLR
2000.0000 mL | Freq: Once | ORAL | Status: AC
  Administered 2014-12-16: 2000 mL via ORAL
  Filled 2014-12-16: qty 4000

## 2014-12-16 NOTE — Progress Notes (Signed)
Subjective: No abdominal pain this AM. States she is still having dark stools. Could not tolerate all the bowel prep, unable to have colonoscopy today.   Objective: Vital signs in last 24 hours: Filed Vitals:   12/15/14 2007 12/16/14 0000 12/16/14 0400 12/16/14 0800  BP: 139/64 97/70 119/60 111/76  Pulse: 95   99  Temp: 97.5 F (36.4 C) 97.8 F (36.6 C) 98.6 F (37 C)   TempSrc: Oral Oral Oral   Resp: 12 18 14 22   Height:      Weight:      SpO2: 100% 99% 98% 100%   Weight change:   Intake/Output Summary (Last 24 hours) at 12/16/14 0907 Last data filed at 12/16/14 0500  Gross per 24 hour  Intake    310 ml  Output    675 ml  Net   -365 ml   Physical Exam: General: Elderly AA female, alert, cooperative, NAD. HEENT: PERRL, EOMI. Moist mucus membranes. Perioral vitiligo.  Neck: Full range of motion without pain, supple, no lymphadenopathy or carotid bruits Lungs: Air entry equal bilaterally. Faint bibasilar crackles.  Heart: RRR, no murmurs, gallops, or rubs Abdomen: Soft, non-tender, non-distended, BS + Extremities: No cyanosis, clubbing, or edema Neurologic: Alert & oriented X3, cranial nerves II-XII intact, strength grossly intact, sensation intact to light touch   Lab Results: Basic Metabolic Panel:  Recent Labs Lab 12/15/14 0223 12/16/14 0240  NA 138 135  K 4.6 4.9  CL 109 106  CO2 21* 20*  GLUCOSE 100* 147*  BUN 57* 49*  CREATININE 2.23* 2.16*  CALCIUM 8.5* 8.4*   Liver Function Tests:  Recent Labs Lab 12/13/14 0244  AST 15  ALT 11*  ALKPHOS 46  BILITOT 0.8  PROT 5.4*  ALBUMIN 2.8*   CBC:  Recent Labs Lab 12/12/14 1322  12/15/14 1950 12/16/14 0240  WBC 7.4  < > 7.6 8.2  NEUTROABS 5.5  --   --   --   HGB 5.1*  < > 8.4* 8.9*  HCT 16.0*  < > 25.8* 27.5*  MCV 89.4  < > 89.6 90.2  PLT 115*  < > 136* 145*  < > = values in this interval not displayed.  CBG:  Recent Labs Lab 12/15/14 1020 12/15/14 1302 12/15/14 1740 12/15/14 2009  12/16/14 0009 12/16/14 0358  GLUCAP 125* 150* 143* 200* 119* 191*   Coagulation:  Recent Labs Lab 12/12/14 1322  LABPROT 21.7*  INR 1.90*   Urine Drug Screen: Drugs of Abuse     Component Value Date/Time   LABOPIA NONE DETECTED 09/30/2014 1450   COCAINSCRNUR NONE DETECTED 09/30/2014 1450   LABBENZ NONE DETECTED 09/30/2014 1450   AMPHETMU NONE DETECTED 09/30/2014 1450   THCU NONE DETECTED 09/30/2014 1450   LABBARB NONE DETECTED 09/30/2014 1450    Urinalysis:  Recent Labs Lab 12/12/14 1710  COLORURINE YELLOW  LABSPEC 1.010  PHURINE 5.0  GLUCOSEU NEGATIVE  HGBUR NEGATIVE  BILIRUBINUR NEGATIVE  KETONESUR NEGATIVE  PROTEINUR 30*  UROBILINOGEN 0.2  NITRITE NEGATIVE  LEUKOCYTESUR MODERATE*    Micro Results: Recent Results (from the past 240 hour(s))  MRSA PCR Screening     Status: None   Collection Time: 12/13/14  4:16 AM  Result Value Ref Range Status   MRSA by PCR NEGATIVE NEGATIVE Final    Comment:        The GeneXpert MRSA Assay (FDA approved for NASAL specimens only), is one component of a comprehensive MRSA colonization surveillance program. It is not intended  to diagnose MRSA infection nor to guide or monitor treatment for MRSA infections.     Medications: I have reviewed the patient's current medications. Scheduled Meds: . insulin aspart  0-9 Units Subcutaneous 6 times per day  . metoprolol tartrate  25 mg Oral BID  . pantoprazole  40 mg Oral BID   Continuous Infusions:   PRN Meds:.mineral oil-hydrophilic petrolatum   Assessment/Plan:  79 y/o F w/ PMHx of DM type II, CKD, anemia, A-fib (previously on Eliquis), HTN, HLD, CVA, osteomyelitis s/p toe amputations bilaterally, admitted for GI bleed.   GI Bleed: Had EGD yesterday which showed moderate friable gastritis, thought to be possibly associated w/ her melena given that she was taking Eliquis, however, still not quite clear. Was scheduled for colonoscopy today, patient could not tolerate  all of bowel prep, did not go for procedure d/t poor prep. Hb seems to be stable, trend as follows:   Recent Labs Lab 12/15/14 0223 12/15/14 1950 12/16/14 0240  HGB 7.7* 8.4* 8.9*  HCT 23.8* 25.8* 27.5*  WBC 7.1 7.6 8.2  PLT 123* 136* 145*  -Appreciate GI following; for colonoscopy in AM -Clears; NPO at midnight -Will move out of stepdown today  -Repeat CBC in AM -Protonix 40 mg bid po  DM type II: CBG trend as follows:   Recent Labs Lab 12/15/14 1302 12/15/14 1740 12/15/14 2009 12/16/14 0009 12/16/14 0358  GLUCAP 150* 143* 200* 119* 191*  -SSI-S  CKD: Cr at baseline.  -Repeat BMP in AM  A-fib w/ Recent CVA: HR stable.  -Hold Eliquis -Continue Metoprolol  HTN: Stable.  -Metoprolol as above  HLD: Stable.  -Restart Lipitor on discharge  DVT/PE PPx: SCD's  Dispo: Disposition is deferred at this time, awaiting improvement of current medical problems.  Anticipated discharge in approximately 1-2 day(s).   The patient does have a current PCP Stacy Falcon, MD) and does need an [facility] hospital follow-up appointment after discharge.  The patient does have transportation limitations that hinder transportation to clinic appointments.  .Services Needed at time of discharge: Y = Yes, Blank = No PT:   OT:   RN:   Equipment:   Other:     LOS: 4 days   Corky Sox, MD 12/16/2014, 9:07 AM

## 2014-12-16 NOTE — Progress Notes (Signed)
Patient is being transferred to  [address].

## 2014-12-16 NOTE — Progress Notes (Signed)
Pt in PACU. Fam in room.

## 2014-12-16 NOTE — Interval H&P Note (Signed)
History and Physical Interval Note:  12/16/2014 8:13 AM  [name]  has presented today for surgery, with the diagnosis of blood detected in stool, anemia  The various methods of treatment have been discussed with the patient and family. After consideration of risks, benefits and other options for treatment, the patient has consented to  Procedure(s): COLONOSCOPY (N/A) as a surgical intervention .  The patient's history has been reviewed, patient examined, no change in status, stable for surgery.  I have reviewed the patient's chart and labs.  Questions were answered to the patient's satisfaction.     Pricilla Riffle. Fuller Plan

## 2014-12-16 NOTE — Progress Notes (Signed)
     [facility] Progress Note  Subjective:  Pt not able to tol prep last night. Bowel prep not adequate. Colonoscopy cancelled today. Denies abd pain. Feels tired.     Objective:  Vital signs in last 24 hours: Temp:  [97.5 F (36.4 C)-98.6 F (37 C)] 98.6 F (37 C) (07/30 0400) Pulse Rate:  [93-99] 99 (07/30 0800) Resp:  [12-22] 22 (07/30 0800) BP: (97-159)/(60-95) 111/76 mmHg (07/30 0800) SpO2:  [98 %-100 %] 100 % (07/30 0800) Last BM Date: 12/16/14 General:   Alert, well-developed, elderly, in NAD Heart:  Regular rate and rhythm; no murmurs Pulm;lungs clear Abdomen:  Soft, nontender and nondistended. Normal bowel sounds, without guarding, and without rebound.   Extremities:  Without edema. Neurologic: Alert and  oriented x4;  grossly normal neurologically.   Intake/Output from previous day: 07/29 0701 - 07/30 0700 In: 310 [I.V.:310] Out: 1075 [Urine:1075] Intake/Output this shift:    Lab Results:  Recent Labs  12/15/14 0223 12/15/14 1950 12/16/14 0240  WBC 7.1 7.6 8.2  HGB 7.7* 8.4* 8.9*  HCT 23.8* 25.8* 27.5*  PLT 123* 136* 145*   BMET  Recent Labs  12/14/14 0224 12/15/14 0223 12/16/14 0240  NA 141 138 135  K 4.7 4.6 4.9  CL 113* 109 106  CO2 19* 21* 20*  GLUCOSE 110* 100* 147*  BUN 71* 57* 49*  CREATININE 2.28* 2.23* 2.16*  CALCIUM 8.7* 8.5* 8.4*     ASSESSMENT/PLAN:   80 yo female with GI bleed with melena. Had EGD yesterday with friable gastritis. For colonoscopy tomorrow morning after repeat bowel prep. Trend Hgb.    LOS: 4 days   Hvozdovic, Deloris Ping 12/16/2014, Pager [phone]     Attending physician's note   I have taken an interval history, reviewed the chart and examined the patient. I agree with the Advanced Practitioner's note, impression and recommendations. Pt did not complete bowel prep and has semi-solid, turbid stool this morning. Bowel prep is not adequate. Will repeat bowel prep and encourage patient to  complete entire prep as ordered. If prep is adequate will proceed with colonoscopy tomorrow.   Pricilla Riffle. Fuller Plan, MD [facility] [phone] pager Mon-Fri 8a-5p [phone] weekends, holidays and 5p-8a or per [facility]

## 2014-12-16 NOTE — H&P (View-Only) (Signed)
Daily Rounding Note  12/14/2014, 12:35 PM  LOS: 2 days   SUBJECTIVE:       No BMs, tolerating fulls.  No abd pain, no nausea  OBJECTIVE:         Vital signs in last 24 hours:    Temp:  [97.8 F (36.6 C)-98.7 F (37.1 C)] 98 F (36.7 C) (07/28 1149) Pulse Rate:  [98-104] 99 (07/28 1052) Resp:  [12-19] 15 (07/28 1149) BP: (139-174)/(52-92) 141/64 mmHg (07/28 1149) SpO2:  [95 %-100 %] 97 % (07/28 1149) Last BM Date: 12/13/14 Filed Weights   12/12/14 2328  Weight: 190 lb 11.2 oz (86.5 kg)   General: pleasnt, looks well   Heart: RRR Chest: clear bil Abdomen: soft, NT, ND.  Active BS  Extremities: no CCE Neuro/Psych:  Pleasant, alert, oriented x 3.  No gross deficits.   Intake/Output from previous day: 07/27 0701 - 07/28 0700 In: 2397.5 [P.O.:360; I.V.:1650; Blood:387.5] Out: 1750 [Urine:1750]  Intake/Output this shift: Total I/O In: 240 [P.O.:240] Out: 850 [Urine:850]  Lab Results:  Recent Labs  12/13/14 0244 12/13/14 1425 12/14/14 0224  WBC 6.3 6.1 7.4  HGB 6.8* 8.9* 8.9*  HCT 20.8* 26.4* 26.2*  PLT 101* 108* 113*   BMET  Recent Labs  12/12/14 1322 12/13/14 0244 12/14/14 0224  NA 139 140 141  K 4.8 4.5 4.7  CL 111 114* 113*  CO2 17* 18* 19*  GLUCOSE 180* 75 110*  BUN 90* 85* 71*  CREATININE 2.80* 2.72* 2.28*  CALCIUM 8.3* 8.1* 8.7*   LFT  Recent Labs  12/13/14 0244  PROT 5.4*  ALBUMIN 2.8*  AST 15  ALT 11*  ALKPHOS 46  BILITOT 0.8   PT/INR  Recent Labs  12/12/14 1322  LABPROT 21.7*  INR 1.90*   Hepatitis Panel No results for input(s): HEPBSAG, HCVAB, HEPAIGM, HEPBIGM in the last 72 hours.  Studies/Results: Dg Chest Port 1 View  12/12/2014   CLINICAL DATA:  Lethargy and weakness.  EXAM: PORTABLE CHEST - 1 VIEW  COMPARISON:  04/28/2011 radiographs  FINDINGS: Cardiomegaly identified.  Elevation of the right hemidiaphragm again noted. There is no evidence of focal  airspace disease, pulmonary edema, suspicious pulmonary nodule/mass, pleural effusion, or pneumothorax. No acute bony abnormalities are identified.  IMPRESSION: Cardiomegaly without evidence of active cardiopulmonary disease.   Electronically Signed   By: Margarette Canada M.D.   On: 12/12/2014 16:28   Scheduled Meds: . insulin aspart  0-9 Units Subcutaneous 6 times per day  . metoprolol tartrate  25 mg Oral BID  . pantoprazole (PROTONIX) IV  40 mg Intravenous Q12H   Continuous Infusions:  PRN Meds:.mineral oil-hydrophilic petrolatum  ASSESMENT:   * GI bleed (melenic stool), suspect upper GI source.  On q 12 IV Protonix.   * Acute blood loss on top of chronic anemia. Patient receives regular injections of B12 and infusions of Aranesp. Followed by hematology for anemia and MGUS Hemoglobin improved following transfusion of PRBC x 3.   * Chronic Eliquis. On hold, presumed last dose was 7/26. Hx a fib.   * CKD. Stage 4. This certainly contributes to anemia.  * Tubulovillous adenoma on colonoscopy in 2000. Has not had colonoscopy screening since then. + Family history of colon cancer in her mother.    PLAN   *  EGD tomorrow 9AM. Pt and dtr aware and agreeable to proceed.    *  Switching to po Protonix.    [name]  12/14/2014, 12:35 PM Pager: [phone]     Attending physician's note   I have taken an interval history, reviewed the chart and examined the patient. I agree with the Advanced Practitioner's note, impression and recommendations. Melena and ABL on chronic anemia. No recurrent bleeding. Suspected UGI bleed. Eliquis on hold. Continue PPI. Plan for EGD tomorrow.  Pricilla Riffle. Fuller Plan, MD [facility] [phone] pager Mon-Fri 8a-5p [phone] weekends, holidays and 5p-8a or per [facility]

## 2014-12-17 ENCOUNTER — Encounter (HOSPITAL_COMMUNITY)
Admission: EM | Disposition: A | Discharge status: Home / Self Care | Payer: Self-pay | Source: Home / Self Care | Attending: Internal Medicine

## 2014-12-17 ENCOUNTER — Encounter (HOSPITAL_COMMUNITY): Payer: Self-pay | Admitting: *Deleted

## 2014-12-17 DIAGNOSIS — D123 Benign neoplasm of transverse colon: Secondary | ICD-10-CM

## 2014-12-17 HISTORY — PX: COLONOSCOPY: SHX5424

## 2014-12-17 LAB — CBC
HCT: 30.5 % — ABNORMAL LOW (ref 36.0–46.0)
HEMOGLOBIN: 9.8 g/dL — AB (ref 12.0–15.0)
MCH: 29.5 pg (ref 26.0–34.0)
MCHC: 32.1 g/dL (ref 30.0–36.0)
MCV: 91.9 fL (ref 78.0–100.0)
Platelets: 152 10*3/uL (ref 150–400)
RBC: 3.32 MIL/uL — ABNORMAL LOW (ref 3.87–5.11)
RDW: 16.2 % — ABNORMAL HIGH (ref 11.5–15.5)
WBC: 8.8 10*3/uL (ref 4.0–10.5)

## 2014-12-17 LAB — BASIC METABOLIC PANEL
Anion gap: 10 (ref 5–15)
BUN: 36 mg/dL — AB (ref 6–20)
CHLORIDE: 108 mmol/L (ref 101–111)
CO2: 21 mmol/L — ABNORMAL LOW (ref 22–32)
Calcium: 8.6 mg/dL — ABNORMAL LOW (ref 8.9–10.3)
Creatinine, Ser: 2.09 mg/dL — ABNORMAL HIGH (ref 0.44–1.00)
GFR calc Af Amer: 25 mL/min — ABNORMAL LOW (ref >60)
GFR, EST NON AFRICAN AMERICAN: 22 mL/min — AB (ref >60)
Glucose, Bld: 126 mg/dL — ABNORMAL HIGH (ref 65–99)
Potassium: 4.3 mmol/L (ref 3.5–5.1)
Sodium: 139 mmol/L (ref 135–145)

## 2014-12-17 LAB — GLUCOSE, CAPILLARY
GLUCOSE-CAPILLARY: 127 mg/dL — AB (ref 65–99)
GLUCOSE-CAPILLARY: 100 mg/dL — AB (ref 65–99)
Glucose-Capillary: 84 mg/dL (ref 65–99)

## 2014-12-17 SURGERY — COLONOSCOPY
Anesthesia: Moderate Sedation

## 2014-12-17 MED ORDER — FENTANYL CITRATE (PF) 100 MCG/2ML IJ SOLN
INTRAMUSCULAR | Status: AC
  Filled 2014-12-17: qty 2

## 2014-12-17 MED ORDER — MIDAZOLAM HCL 10 MG/2ML IJ SOLN
INTRAMUSCULAR | Status: DC | PRN
  Administered 2014-12-17: 2 mg via INTRAVENOUS
  Administered 2014-12-17: 1 mg via INTRAVENOUS

## 2014-12-17 MED ORDER — MIDAZOLAM HCL 5 MG/ML IJ SOLN
INTRAMUSCULAR | Status: AC
Start: 2014-12-17 — End: 2014-12-17
  Filled 2014-12-17: qty 2

## 2014-12-17 MED ORDER — ELIQUIS 5 MG PO TABS: 5.0000 mg | ORAL_TABLET | Freq: Two times a day (BID) | ORAL | Status: DC

## 2014-12-17 MED ORDER — ASPIRIN 81 MG PO TBEC: 81.0000 mg | DELAYED_RELEASE_TABLET | Freq: Every day | ORAL | Status: DC

## 2014-12-17 MED ORDER — FENTANYL CITRATE (PF) 100 MCG/2ML IJ SOLN
INTRAMUSCULAR | Status: DC | PRN
  Administered 2014-12-17: 25 ug via INTRAVENOUS
  Administered 2014-12-17: 12.5 ug via INTRAVENOUS

## 2014-12-17 MED ORDER — DIPHENHYDRAMINE HCL 50 MG/ML IJ SOLN
INTRAMUSCULAR | Status: AC
  Filled 2014-12-17: qty 1

## 2014-12-17 MED ORDER — SODIUM CHLORIDE 0.9 % IV SOLN
INTRAVENOUS | Status: DC
  Administered 2014-12-17: 500 mL via INTRAVENOUS

## 2014-12-17 NOTE — Discharge Summary (Signed)
Name: [name] MRN: [id number] DOB: 1935/05/07 80 y.o. PCP: Stacy Falcon, MD  Date of Admission: 12/12/2014  2:47 PM Date of Discharge: 12/19/2014 Attending Physician: No att. providers found  Discharge Diagnosis:  Principal Problem:   UGI bleed Active Problems:   Hypothyroidism   Diabetes mellitus with neuropathy   Hyperlipidemia   Essential hypertension   CKD (chronic kidney disease) stage 4, GFR 15-29 ml/min   Atrial fibrillation   Melena   Absolute anemia   Gastrointestinal hemorrhage with melena   Gastritis   Benign neoplasm of transverse colon  Discharge Medications:   Medication List    STOP taking these medications        docusate sodium 100 MG capsule  Commonly known as:  COLACE     pravastatin 20 MG tablet  Commonly known as:  PRAVACHOL      TAKE these medications        aspirin 81 MG EC tablet  Take 1 tablet (81 mg total) by mouth daily. RESUME taking medication on 01/01/2015.  Start taking on:  01/01/2015     atorvastatin 40 MG tablet  Commonly known as:  LIPITOR  Take 1 tablet (40 mg total) by mouth daily at 6 PM.     calcitRIOL 0.25 MCG capsule  Commonly known as:  ROCALTROL  Take 1 capsule by mouth every Monday, Wednesday, and Friday.     ELIQUIS 5 MG Tabs tablet  Generic drug:  apixaban  Take 1 tablet (5 mg total) by mouth 2 (two) times daily. RESUME taking medication on 12/22/2014.  Start taking on:  12/22/2014     glipiZIDE 10 MG tablet  Commonly known as:  GLUCOTROL  Take 0.5 tablets (5 mg total) by mouth 2 (two) times daily before a meal.     JANUVIA 25 MG tablet  Generic drug:  sitaGLIPtin  TAKE 1 TABLET BY MOUTH EVERY DAY     levothyroxine 112 MCG tablet  Commonly known as:  SYNTHROID, LEVOTHROID  Take 1 tablet (112 mcg total) by mouth daily before breakfast.     lisinopril 40 MG tablet  Commonly known as:  PRINIVIL,ZESTRIL  TAKE ONE TABLET BY MOUTH ONE TIME DAILY     loratadine 10 MG tablet  Commonly known as:  CLARITIN   Take 10 mg by mouth daily. 10/10/14 Daily x 5 days then prn daily     metoprolol tartrate 25 MG tablet  Commonly known as:  LOPRESSOR  Take 1 tablet (25 mg total) by mouth 2 (two) times daily.     pantoprazole 40 MG tablet  Commonly known as:  PROTONIX  Take 1 tablet (40 mg total) by mouth daily.     polyethylene glycol packet  Commonly known as:  MIRALAX / GLYCOLAX  Take 17 g by mouth daily as needed for mild constipation or moderate constipation.     sennosides-docusate sodium 8.6-50 MG tablet  Commonly known as:  SENOKOT-S  Take 2 tablets by mouth daily.     Vitamin D3 10000 UNITS capsule  Take 1 capsule (10,000 Units total) by mouth once a week.        Disposition and follow-up:   [name] was discharged from [facility] in Stable condition.  At the hospital follow up visit please address:  1.  Anemia secondary to GI bleed Pt was on both ASA and Eliquis at home. Consider reducing dose of Eliquis if she continues to bleed. Any more episodes of melena? Any  abdominal pain?  2.  Labs / imaging needed at time of follow-up: CBC, BMP  3.  Pending labs/ test needing follow-up: None  Follow-up Appointments: Follow-up Information    Follow up with Stacy Chiquito, MD. Schedule an appointment as soon as possible for a visit in 1 week.   Specialty:  Internal Medicine   Why:  Please make appointment with Dr. Daryll Mosley within 1 week.    Contact information:   [place] [address] [phone]       Discharge Instructions: Discharge Instructions    Increase activity slowly    Complete by:  As directed            Consultations:    Procedures Performed:  Dg Chest Port 1 View  12/12/2014   CLINICAL DATA:  Lethargy and weakness.  EXAM: PORTABLE CHEST - 1 VIEW  COMPARISON:  04/28/2011 radiographs  FINDINGS: Cardiomegaly identified.  Elevation of the right hemidiaphragm again noted. There is no evidence of focal airspace disease, pulmonary  edema, suspicious pulmonary nodule/mass, pleural effusion, or pneumothorax. No acute bony abnormalities are identified.  IMPRESSION: Cardiomegaly without evidence of active cardiopulmonary disease.   Electronically Signed   By: [name] M.D.   On: 12/12/2014 16:28    Admission HPI: [name] is a 80 y.o. female with a past medical history of type 2 diabetes chronic kidney disease with, anemia, atrial fibrillation on the Eliquis 5 mg twice a day and aspirin 81 mg daily, hypertension hyperlipidemia history of CVA who presents to the emergency department this morning with the complaints of progressively worse fatigue for the last 2 days. She also reported of being constipated for the past 2 days and reports melena for about a week. Workup reveals in the ER an H&H of 5. 1/16 0.0 which is down from July 20 when he was 9.2/29.7.  When seen the patient was in no acute distress and stated she felt better after the transfusion has been initiated.  Hospital Course by problem list: Principal Problem:   UGI bleed Active Problems:   Hypothyroidism   Diabetes mellitus with neuropathy   Hyperlipidemia   Essential hypertension   CKD (chronic kidney disease) stage 4, GFR 15-29 ml/min   Atrial fibrillation   Melena   Absolute anemia   Gastrointestinal hemorrhage with melena   Gastritis   Benign neoplasm of transverse colon   1. Anemia Pt stated she had been feeling weak for the past 2-3 months, worse on 7/26. Hx of melena for the past 1-2 weeks. States she takes Eliquis 5 mg BID and ASA 81 mg QD at home. No recent NSAID use. Upon admission on 7/26, H/H were 5.1/ 16.0. H/H on 6/20 were 9.2/ 29.7. Pulse 91-127, BP 101-141/ 42-81. Plat 115, Ag 11, Scr 2.80, PT 21.7, and INR 1.9. Melena most likely due upper GI bleed because pt was taking both Eliquis and ASA at home. Pt admitted to stepdown and received 3 units of blood, NS @ 75 cc/hr, and started on IV Protonix 40 mg q12. ASA and Eliquis were held.GI was  consulted. After receiving blood, her Hgb went up to 8.9. However, patient continued to have melena and was tachycardic. On 7/28, home med Lopressor was restarted for rate control. On 7/29, patient continued to have melena and Hgb dropped to 7.7, however, vitals stable with no hypotension or tachycardia. EGD on 7/29 revealed moderate, friable gastritis in the gastric fundus and gastric body. Several small gastric polyps and a  small hiatal hernia. Multiple biopsies performed, awaiting pathology results. As per endoscopist Dr. Fuller Plan, gastritis is a potential source for GI bleeding in the setting of anticoagulation. They decided to proceed with a colonoscopy to evaluate for LGI sources of bleeding. PPI switched from IV to po. Colonoscopy on 7/31 revealed three sessile polyps in the transverse colon, polypectomies performed. Mild diverticulosis in the transverse and ascending colon. Moderate diverticulosis in the sigmoid colon. Grade I internal hemorrhoids. Pt was feeling well and not complaining of any CP, SOB, weakness, dizziness, or abdominal pain. H/H 9.8 and 30.5. She was discharged home after being able to tolerate po intake. Pt to f/u with PCP Dr. Daryll Mosley within 1 week.   DM: controlled  -cont ssi  CKD: SCr 2.72 on admission. Trended down to baseline on 7/31.   A-fib and Hx of CVA -held Eliquis in the setting of severe anemia and melena.   HTN Held home meds  HLD Held home meds   Discharge Vitals:   BP 158/91 mmHg  Pulse 99  Temp(Src) 98.2 F (36.8 C) (Oral)  Resp 13  Ht 5\' 5"  (1.651 m)  Wt 190 lb 11.2 oz (86.5 kg)  BMI 31.73 kg/m2  SpO2 100%  Discharge Labs:  No results found for this or any previous visit (from the past 24 hour(s)).  Signed: Shela Leff, MD 12/19/2014, 12:15 AM    Services Ordered on Discharge: None Equipment Ordered on Discharge: None

## 2014-12-17 NOTE — Op Note (Signed)
[facility] [place], [id number]   COLONOSCOPY PROCEDURE REPORT PATIENT: [name], [name]  MR#: [id number] BIRTHDATE: Jan 08, 1935 , 79  yrs. old GENDER: female ENDOSCOPIST: Ladene Artist, MD, [facility] REFERRED KU:[facility] PROCEDURE DATE:  12/17/2014 PROCEDURE:   Colonoscopy, diagnostic and Colonoscopy with snare polypectomy First Screening Colonoscopy - Avg.  risk and is 50 yrs.  old or older - No.  Prior Negative Screening - Now for repeat screening. N/A  History of Adenoma - Now for follow-up colonoscopy & has been > or = to 3 yrs.  Yes hx of adenoma.  Has been 3 or more years since last colonoscopy.  Polyps removed today? Yes ASA CLASS:   Class III INDICATIONS:Evaluation of unexplained GI bleeding, Surveillance due to prior colonic neoplasia, PH Colon Adenoma, and melena. MEDICATIONS: Fentanyl 37.5 mcg IV and Versed 3 mg IV DESCRIPTION OF PROCEDURE:   After the risks benefits and alternatives of the procedure were thoroughly explained, informed consent was obtained.  The digital rectal exam revealed no abnormalities of the rectum.   The Pentax Ped Colon [id number] endoscope was introduced through the anus and advanced to the cecum, which was identified by both the appendix and ileocecal valve. No adverse events experienced.   The quality of the prep was good.  (Suprep was used)  The instrument was then slowly withdrawn as the colon was fully examined. Estimated blood loss is zero unless otherwise noted in this procedure report.  COLON FINDINGS: Three sessile polyps measuring 5-8 mm in size were found in the transverse colon.  Polypectomies were performed using snare cautery (1) and with a cold snare (2).  The resection was complete, the polyp tissue was completely retrieved and sent to histology.   There was mild diverticulosis noted in the transverse colon and ascending colon.   There was moderate diverticulosis noted in the sigmoid  colon with associated colonic spasm, luminal narrowing and muscular hypertrophy.   The examination was otherwise normal.  Retroflexion was not performed due to a narrow rectal vault revealed internal Grade I hemorrhoids. The time to cecum = 21.0 Withdrawal time = 21.4   The scope was withdrawn and the procedure completed. COMPLICATIONS: There were no immediate complications. ENDOSCOPIC IMPRESSION: 1.   Three sessile polyps in the transverse colon; polypectomies performed using snare cautery and with a cold snare 2.   Mild diverticulosis in the transverse colon and ascending colon  3.   Moderate diverticulosis noted in the sigmoid colon 4.   Grade l internal hemorrhoids RECOMMENDATIONS: 1.  Hold Aspirin and all other NSAIDS for 2 weeks. OK to resume Eliquis in 5 days as indicated 2.  Await pathology results 3.  Advance diet today as tolerated and use high fiber diet with liberal fluid intake long term. 4.  Given your age, you will not need another colonoscopy for colon cancer screening or polyp surveillance.  These types of tests usually stop around the age 64. 71.  GI signing off. No GI follow up needed at this time. Can follow up post hospital with PCP.  eSigned:  Ladene Artist, MD, [facility] 12/17/2014 8:53 AM

## 2014-12-17 NOTE — Progress Notes (Signed)
Subjective: Patient seen and examined at bedside today. States she is feeling well and does not have any complaints. Denies CP, SOB, lightheadedness, abdominal pain, nausea, or vomiting. She had her colonoscopy this morning.    Objective: Vital signs in last 24 hours: Filed Vitals:   12/16/14 2021 12/16/14 2218 12/17/14 0012 12/17/14 0444  BP: 135/70 156/87 152/91 101/75  Pulse: 86 107 96 98  Temp: 98.2 F (36.8 C)  98 F (36.7 C) 98.4 F (36.9 C)  TempSrc: Oral  Oral Oral  Resp: 16  16 16   Height:      Weight:      SpO2: 100%  100% 99%   Weight change:   Intake/Output Summary (Last 24 hours) at 12/17/14 0709 Last data filed at 12/17/14 0415  Gross per 24 hour  Intake   1320 ml  Output      0 ml  Net   1320 ml   Physical Exam  Constitutional: She is oriented to person, place, and time and well-developed, well-nourished, and in no distress.  HENT:  Head: Normocephalic and atraumatic.  Mouth/Throat: Oropharynx is clear and moist.  Eyes: EOM are normal. Pupils are equal, round, and reactive to light.  Neck: Normal range of motion. Neck supple. No tracheal deviation present.  Cardiovascular: Normal rate.  Murmur heard. Systolic ejection murmur most pronounced at the aortic valve region  Pulmonary/Chest: CTAB. Effort normal. No respiratory distress. She has no wheezes.   Abdominal: Soft. Bowel sounds are normal. She exhibits no distension. There is no tenderness. There is no rebound and no guarding.  Musculoskeletal: Normal range of motion. She exhibits no edema.  Neurological: She is alert and oriented to person, place, and time.  Skin: Skin is warm and dry.  Psychiatric: Mood and affect normal.   Lab Results: Basic Metabolic Panel:  Recent Labs Lab 12/16/14 0240 12/17/14 0551  NA 135 139  K 4.9 4.3  CL 106 108  CO2 20* 21*  GLUCOSE 147* 126*  BUN 49* 36*  CREATININE 2.16* 2.09*  CALCIUM 8.4* 8.6*   Liver Function Tests:  Recent Labs Lab  12/13/14 0244  AST 15  ALT 11*  ALKPHOS 46  BILITOT 0.8  PROT 5.4*  ALBUMIN 2.8*   CBC:  Recent Labs Lab 12/12/14 1322  12/16/14 0240 12/17/14 0551  WBC 7.4  < > 8.2 8.8  NEUTROABS 5.5  --   --   --   HGB 5.1*  < > 8.9* 9.8*  HCT 16.0*  < > 27.5* 30.5*  MCV 89.4  < > 90.2 91.9  PLT 115*  < > 145* 152  < > = values in this interval not displayed.  CBG:  Recent Labs Lab 12/16/14 0803 12/16/14 1157 12/16/14 1632 12/16/14 2030 12/17/14 0010 12/17/14 0439  GLUCAP 96 120* 197* 95 84 127*   Coagulation:  Recent Labs Lab 12/12/14 1322  LABPROT 21.7*  INR 1.90*   Urine Drug Screen: Drugs of Abuse     Component Value Date/Time   LABOPIA NONE DETECTED 09/30/2014 1450   COCAINSCRNUR NONE DETECTED 09/30/2014 1450   LABBENZ NONE DETECTED 09/30/2014 1450   AMPHETMU NONE DETECTED 09/30/2014 1450   THCU NONE DETECTED 09/30/2014 1450   LABBARB NONE DETECTED 09/30/2014 1450   Urinalysis:  Recent Labs Lab 12/12/14 1710  COLORURINE YELLOW  LABSPEC 1.010  PHURINE 5.0  GLUCOSEU NEGATIVE  HGBUR NEGATIVE  BILIRUBINUR NEGATIVE  KETONESUR NEGATIVE  PROTEINUR 30*  UROBILINOGEN 0.2  NITRITE NEGATIVE  LEUKOCYTESUR MODERATE*  Micro Results: Recent Results (from the past 240 hour(s))  MRSA PCR Screening     Status: None   Collection Time: 12/13/14  4:16 AM  Result Value Ref Range Status   MRSA by PCR NEGATIVE NEGATIVE Final    Comment:        The GeneXpert MRSA Assay (FDA approved for NASAL specimens only), is one component of a comprehensive MRSA colonization surveillance program. It is not intended to diagnose MRSA infection nor to guide or monitor treatment for MRSA infections.    Studies/Results: No results found. Medications: I have reviewed the patient's current medications. Scheduled Meds: . insulin aspart  0-9 Units Subcutaneous 6 times per day  . metoprolol tartrate  25 mg Oral BID  . pantoprazole  40 mg Oral BID   Continuous Infusions:    PRN Meds:.mineral oil-hydrophilic petrolatum Assessment/Plan: Principal Problem:   UGI bleed Active Problems:   Hypothyroidism   Diabetes mellitus with neuropathy   Hyperlipidemia   Essential hypertension   CKD (chronic kidney disease) stage 4, GFR 15-29 ml/min   Atrial fibrillation   Melena   Absolute anemia   Gastrointestinal hemorrhage with melena   Gastritis  80 yo F with a PMHx of DM, CKD, anemia, A-fib, HTN, HLD, CVA, osteomyelitis s/p toe amputations bilaterally presenting with a 2-3 months hx of progressive fatigue/weakness and 1-2 week hx of melena.   Anemia Hgb 9.8 today. Denies CP, SOB, lightheadedness, abdominal pain, nausea, or vomiting. EGD done previously revealed moderate, friable gastritis in the gastric fundus and gastric body. Several small gastric polyps and a small hiatal hernia. Multiple biopsies performed, awaiting pathology results. As per endoscopist Dr. Fuller Plan, gastritis is a potential source for Gi bleeding in the setting of anticoagulation. Colonoscopy done today revealed three sessile polyps in the transverse colon, polypectomies performed. Mild diverticulosis in the transverse and ascending colon. Moderate diverticulosis in the sigmoid colon. Grade I internal hemorrhoids.  -Appreciate GI recs and f/u colonoscopy results  -Cont home med Lopressor 25 mg BID for rate control.  -F/u AM CBC -PRBCs if Hgb <7 -cont oral Protonix 40 mg BID -cont to hold ASA and Eliquis  -Monitor vitals closely  -advance diet as tolerated and pt to use high fiber diet with liberal fluid intake long term  -F/u with PCP as outpatient  -Discharge patient to home today if she is able to tolerate diet   DM: CBG 100 today.  -cont ssi -CBG monitoring   CKD: SCr trended down from 2.72 on admission to 2.09 today.  -Encourage po intake.  -F/u AM BMP  A-fib and Hx of CVA --hold ASA and NSAIDS for 2 weeks and resume Eliquis in 5 days as per GI recommendation.   HTN:  stable -Lopressor as above  HLD: stable -restart Lipitor on discharge   DVT ppx: SCDs  CODE: FULL   Dispo: Disposition is deferred at this time, awaiting improvement of current medical problems.  Anticipated discharge in approximately 1-2 day(s).   The patient does have a current PCP [name], MD) and does need an [facility] hospital follow-up appointment after discharge.  The patient does not have transportation limitations that hinder transportation to clinic appointments.  .Services Needed at time of discharge: Y = Yes, Blank = No PT:   OT:   RN:   Equipment:   Other:     LOS: 5 days   Shela Leff, MD 12/17/2014, 7:09 AM

## 2014-12-17 NOTE — H&P (View-Only) (Signed)
     [facility] Progress Note  Subjective:  Pt not able to tol prep last night. Bowel prep not adequate. Colonoscopy cancelled today. Denies abd pain. Feels tired.     Objective:  Vital signs in last 24 hours: Temp:  [97.5 F (36.4 C)-98.6 F (37 C)] 98.6 F (37 C) (07/30 0400) Pulse Rate:  [93-99] 99 (07/30 0800) Resp:  [12-22] 22 (07/30 0800) BP: (97-159)/(60-95) 111/76 mmHg (07/30 0800) SpO2:  [98 %-100 %] 100 % (07/30 0800) Last BM Date: 12/16/14 General:   Alert, well-developed, elderly, in NAD Heart:  Regular rate and rhythm; no murmurs Pulm;lungs clear Abdomen:  Soft, nontender and nondistended. Normal bowel sounds, without guarding, and without rebound.   Extremities:  Without edema. Neurologic: Alert and  oriented x4;  grossly normal neurologically.   Intake/Output from previous day: 07/29 0701 - 07/30 0700 In: 310 [I.V.:310] Out: 1075 [Urine:1075] Intake/Output this shift:    Lab Results:  Recent Labs  12/15/14 0223 12/15/14 1950 12/16/14 0240  WBC 7.1 7.6 8.2  HGB 7.7* 8.4* 8.9*  HCT 23.8* 25.8* 27.5*  PLT 123* 136* 145*   BMET  Recent Labs  12/14/14 0224 12/15/14 0223 12/16/14 0240  NA 141 138 135  K 4.7 4.6 4.9  CL 113* 109 106  CO2 19* 21* 20*  GLUCOSE 110* 100* 147*  BUN 71* 57* 49*  CREATININE 2.28* 2.23* 2.16*  CALCIUM 8.7* 8.5* 8.4*     ASSESSMENT/PLAN:   80 yo female with GI bleed with melena. Had EGD yesterday with friable gastritis. For colonoscopy tomorrow morning after repeat bowel prep. Trend Hgb.    LOS: 4 days   Hvozdovic, Deloris Ping 12/16/2014, Pager [phone]     Attending physician's note   I have taken an interval history, reviewed the chart and examined the patient. I agree with the Advanced Practitioner's note, impression and recommendations. Pt did not complete bowel prep and has semi-solid, turbid stool this morning. Bowel prep is not adequate. Will repeat bowel prep and encourage patient to  complete entire prep as ordered. If prep is adequate will proceed with colonoscopy tomorrow.   Pricilla Riffle. Fuller Plan, MD [facility] [phone] pager Mon-Fri 8a-5p [phone] weekends, holidays and 5p-8a or per [facility]

## 2014-12-17 NOTE — Interval H&P Note (Signed)
History and Physical Interval Note:  12/17/2014 8:02 AM  [name]  has presented today for surgery, with the diagnosis of heme positive stools, anemia  The various methods of treatment have been discussed with the patient and family. After consideration of risks, benefits and other options for treatment, the patient has consented to  Procedure(s): COLONOSCOPY (N/A) as a surgical intervention .  The patient's history has been reviewed, patient examined, no change in status, stable for surgery.  I have reviewed the patient's chart and labs.  Questions were answered to the patient's satisfaction.     Pricilla Riffle. Fuller Plan

## 2014-12-17 NOTE — Progress Notes (Signed)
NURSING PROGRESS NOTE  [name] [id number] Discharge Data: 12/17/2014 3:26 PM Attending Provider: No att. providers found OOI:[id number], Raquel Sarna, MD   Boris Sharper to be D/C'd Home per MD order.    All IV's will be discontinued and monitored for bleeding.  All belongings will be returned to patient for patient to take home.  Last Documented Vital Signs:  Blood pressure 158/91, pulse 99, temperature 98.2 F (36.8 C), temperature source Oral, resp. rate 13, height 5\' 5"  (1.651 m), weight 86.5 kg (190 lb 11.2 oz), SpO2 100 %.  Joslyn Hy, MSN, RN, [employer]

## 2014-12-17 NOTE — Discharge Instructions (Signed)
Please make appointment with your primary care physician Dr. Daryll Drown within 1 week for a follow up.   Do not take Asprin and NSAIDS for 2 weeks.  Resume taking Aspirin 81 mg 1 tablet by mouth daily on 01/01/2015.  Resume taking Eliquis 5 mg 1 tablet by mouth twice a day on 12/22/2014.   Please keep taking Protonix 40 mg by mouth daily.   We recommend a high fiber diet with liberal fluid intake at home.   Please return to the hospital urgently if you have any more episodes of black/ tarry stools or blood in stool.   Bloody Stools Bloody stools often mean that there is a problem in the digestive tract. Your caregiver may use the term "melena" to describe black, tarry, and bad smelling stools or "hematochezia" to describe red or maroon-colored stools. Blood seen in the stool can be caused by bleeding anywhere along the intestinal tract.  A black stool usually means that blood is coming from the upper part of the gastrointestinal tract (esophagus, stomach, or small bowel). Passing maroon-colored stools or bright red blood usually means that blood is coming from lower down in the large bowel or the rectum. However, sometimes massive bleeding in the stomach or small intestine can cause bright red bloody stools.  Consuming black licorice, lead, iron pills, medicines containing bismuth subsalicylate, or blueberries can also cause black stools. Your caregiver can test black stools to see if blood is present. It is important that the cause of the bleeding be found. Treatment can then be started, and the problem can be corrected. Rectal bleeding may not be serious, but you should not assume everything is okay until you know the cause.It is very important to follow up with your caregiver or a specialist in gastrointestinal problems. CAUSES  Blood in the stools can come from various underlying causes.Often, the cause is not found during your first visit. Testing is often needed to discover the cause of bleeding  in the gastrointestinal tract. Causes range from simple to serious or even life-threatening.Possible causes include:  Hemorrhoids.These are veins that are full of blood (engorged) in the rectum. They cause pain, inflammation, and may bleed.  Anal fissures.These are areas of painful tearing which may bleed. They are often caused by passing hard stool.  Diverticulosis.These are pouches that form on the colon over time, with age, and may bleed significantly.  Diverticulitis.This is inflammation in areas with diverticulosis. It can cause pain, fever, and bloody stools, although bleeding is rare.  Proctitis and colitis. These are inflamed areas of the rectum or colon. They may cause pain, fever, and bloody stools.  Polyps and cancer. Colon cancer is a leading cause of preventable cancer death.It often starts out as precancerous polyps that can be removed during a colonoscopy, preventing progression into cancer. Sometimes, polyps and cancer may cause rectal bleeding.  Gastritis and ulcers.Bleeding from the upper gastrointestinal tract (near the stomach) may travel through the intestines and produce black, sometimes tarry, often bad smelling stools. In certain cases, if the bleeding is fast enough, the stools may not be black, but red and the condition may be life-threatening. SYMPTOMS  You may have stools that are bright red and bloody, that are normal color with blood on them, or that are dark black and tarry. In some cases, you may only have blood in the toilet bowl. Any of these cases need medical care. You may also have:  Pain at the anus or anywhere in the rectum.  Lightheadedness  or feeling faint.  Extreme weakness.  Nausea or vomiting.  Fever. DIAGNOSIS Your caregiver may use the following methods to find the cause of your bleeding:  Taking a medical history. Age is important. Older people tend to develop polyps and cancer more often. If there is anal pain and a hard, large  stool associated with bleeding, a tear of the anus may be the cause. If blood drips into the toilet after a bowel movement, bleeding hemorrhoids may be the problem. The color and frequency of the bleeding are additional considerations. In most cases, the medical history provides clues, but seldom the final answer.  A visual and finger (digital) exam. Your caregiver will inspect the anal area, looking for tears and hemorrhoids. A finger exam can provide information when there is tenderness or a growth inside. In men, the prostate is also examined.  Endoscopy. Several types of small, long scopes (endoscopes) are used to view the colon.  In the office, your caregiver may use a rigid, or more commonly, a flexible viewing sigmoidoscope. This exam is called flexible sigmoidoscopy. It is performed in 5 to 10 minutes.  A more thorough exam is accomplished with a colonoscope. It allows your caregiver to view the entire 5 to 6 foot long colon. Medicine to help you relax (sedative) is usually given for this exam. Frequently, a bleeding lesion may be present beyond the reach of the sigmoidoscope. So, a colonoscopy may be the best exam to start with. Both exams are usually done on an outpatient basis. This means the patient does not stay overnight in the hospital or surgery center.  An upper endoscopy may be needed to examine your stomach. Sedation is used and a flexible endoscope is put in your mouth, down to your stomach.  A barium enema X-ray. This is an X-ray exam. It uses liquid barium inserted by enema into the rectum. This test alone may not identify an actual bleeding point. X-rays highlight abnormal shadows, such as those made by lumps (tumors), diverticuli, or colitis. TREATMENT  Treatment depends on the cause of your bleeding.   For bleeding from the stomach or colon, the caregiver doing your endoscopy or colonoscopy may be able to stop the bleeding as part of the procedure.  Inflammation or  infection of the colon can be treated with medicines.  Many rectal problems can be treated with creams, suppositories, or warm baths.  Surgery is sometimes needed.  Blood transfusions are sometimes needed if you have lost a lot of blood.  For any bleeding problem, let your caregiver know if you take aspirin or other blood thinners regularly. HOME CARE INSTRUCTIONS   Take any medicines exactly as prescribed.  Keep your stools soft by eating a diet high in fiber. Prunes (1 to 3 a day) work well for many people.  Drink enough water and fluids to keep your urine clear or pale yellow.  Take sitz baths if advised. A sitz bath is when you sit in a bathtub with warm water for 10 to 15 minutes to soak, soothe, and cleanse the rectal area.  If enemas or suppositories are advised, be sure you know how to use them. Tell your caregiver if you have problems with this.  Monitor your bowel movements to look for signs of improvement or worsening. SEEK MEDICAL CARE IF:   You do not improve in the time expected.  Your condition worsens after initial improvement.  You develop any new symptoms. SEEK IMMEDIATE MEDICAL CARE IF:   You  develop severe or prolonged rectal bleeding.  You vomit blood.  You feel weak or faint.  You have a fever. MAKE SURE YOU:  Understand these instructions.  Will watch your condition.  Will get help right away if you are not doing well or get worse. Document Released: 04/25/2002 Document Revised: 07/28/2011 Document Reviewed: 09/20/2010 [facility] Patient Information 2015 [place], [place]. This information is not intended to replace advice given to you by your health care provider. Make sure you discuss any questions you have with your health care provider.  Gastrointestinal Bleeding Gastrointestinal (GI) bleeding means there is bleeding somewhere along the digestive tract, between the mouth and anus. CAUSES  There are many different problems that can cause GI  bleeding. Possible causes include:  Esophagitis. This is inflammation, irritation, or swelling of the esophagus.  Hemorrhoids.These are veins that are full of blood (engorged) in the rectum. They cause pain, inflammation, and may bleed.  Anal fissures.These are areas of painful tearing which may bleed. They are often caused by passing hard stool.  Diverticulosis.These are pouches that form on the colon over time, with age, and may bleed significantly.  Diverticulitis.This is inflammation in areas with diverticulosis. It can cause pain, fever, and bloody stools, although bleeding is rare.  Polyps and cancer. Colon cancer often starts out as precancerous polyps.  Gastritis and ulcers.Bleeding from the upper gastrointestinal tract (near the stomach) may travel through the intestines and produce black, sometimes tarry, often bad smelling stools. In certain cases, if the bleeding is fast enough, the stools may not be black, but red. This condition may be life-threatening. SYMPTOMS   Vomiting bright red blood or material that looks like coffee grounds.  Bloody, black, or tarry stools. DIAGNOSIS  Your caregiver may diagnose your condition by taking your history and performing a physical exam. More tests may be needed, including:  X-rays and other imaging tests.  Esophagogastroduodenoscopy (EGD). This test uses a flexible, lighted tube to look at your esophagus, stomach, and small intestine.  Colonoscopy. This test uses a flexible, lighted tube to look at your colon. TREATMENT  Treatment depends on the cause of your bleeding.   For bleeding from the esophagus, stomach, small intestine, or colon, the caregiver doing your EGD or colonoscopy may be able to stop the bleeding as part of the procedure.  Inflammation or infection of the colon can be treated with medicines.  Many rectal problems can be treated with creams, suppositories, or warm baths.  Surgery is sometimes needed.  Blood  transfusions are sometimes needed if you have lost a lot of blood. If bleeding is slow, you may be allowed to go home. If there is a lot of bleeding, you will need to stay in the hospital for observation. HOME CARE INSTRUCTIONS   Take any medicines exactly as prescribed.  Keep your stools soft by eating foods that are high in fiber. These foods include whole grains, legumes, fruits, and vegetables. Prunes (1 to 3 a day) work well for many people.  Drink enough fluids to keep your urine clear or pale yellow. SEEK IMMEDIATE MEDICAL CARE IF:   Your bleeding increases.  You feel lightheaded, weak, or you faint.  You have severe cramps in your back or abdomen.  You pass large blood clots in your stool.  Your problems are getting worse. MAKE SURE YOU:   Understand these instructions.  Will watch your condition.  Will get help right away if you are not doing well or get worse. Document Released:  05/02/2000 Document Revised: 04/21/2012 Document Reviewed: 04/14/2011 [employer] Patient Information 2015 [place], [place]. This information is not intended to replace advice given to you by your health care provider. Make sure you discuss any questions you have with your health care provider.

## 2014-12-18 ENCOUNTER — Encounter (HOSPITAL_COMMUNITY): Payer: Self-pay | Admitting: Gastroenterology

## 2014-12-19 ENCOUNTER — Encounter: Payer: Self-pay | Admitting: Gastroenterology

## 2015-01-01 ENCOUNTER — Other Ambulatory Visit: Payer: Self-pay | Admitting: Nurse Practitioner

## 2015-01-01 ENCOUNTER — Other Ambulatory Visit: Payer: Self-pay | Admitting: Internal Medicine

## 2015-01-02 ENCOUNTER — Encounter: Payer: Self-pay | Admitting: Neurology

## 2015-01-02 ENCOUNTER — Ambulatory Visit (INDEPENDENT_AMBULATORY_CARE_PROVIDER_SITE_OTHER): Payer: Medicare Other | Admitting: Neurology

## 2015-01-02 VITALS — BP 161/92 | HR 90 | Ht 67.0 in | Wt 151.2 lb

## 2015-01-02 DIAGNOSIS — I63132 Cerebral infarction due to embolism of left carotid artery: Secondary | ICD-10-CM

## 2015-01-02 NOTE — Progress Notes (Signed)
[name] [address] [place]. [place] [address] [phone]       OFFICE FOLLOW-UP NOTE  [name] Date of Birth:  02-16-1935 Medical Record Number:  [id number]   HPI:   [name] is a 80 y.o. female with a history of hypertension, hyperlipidemia, chronic renal insufficiency, diabetes mellitus, atrial fibrillation on anticoagulation in the past which was stopped by the patient secondary to bruising. Since that time she has been on aspirin 325 mg daily. On 09/29/14 evening the patient was speaking on the telephone sometime between 8 and 9 PM when she noted she was having difficulty with her speech. Her words seemed slurred. When she got off of the phone she attempted to ambulate with her walker and noted left sided weakness in the upper extremity more so than the lower extremity. There was no one with her at that time and she did not seek medical attention. She was brought to the emergency department today by her son and daughter for further evaluation. They noted that the patient had been choking while trying to eat. She has residual deficits although the family feels that they are somewhat improved.  Date last known well: Date: 09/29/2014 Time last known well: Unable to determine tPA Given: No: The patient was out of the window for therapeutic treatment. On admission, [name] showed some questionable facial droop and expressive aphasia. Head CT showed no acute intracranial findings but was significant for chronic ischemic microvascular disease with a few small old bilateral central lacunar infarcts. Neurology was consulted. She underwent full stroke work up. Likely cause of her infarct was embolic as she has atrial fibrillation not on anti-coagulation despite CHADSVasc score of 7. Her lisinopril was held to allow for permissive hypertension in the setting of acute stroke. MRI was significant for patchy areas of acute ischemia in the R frontal lobe (MCA territory)  and remote bilateral thalamus and R basal ganglia lacunar infarct. MRA showed no large vessel occlusion or definite high-grade stenosis. Lipid panel and UDS unremarkable. She was switched from her home statin to high intensity statin, atorvastatin 40mg  daily. Patient's last HbA1c was 6.6 (07/2014). Echo with LV EF 35-40%, hypokinesis of the mid-apicalanteroseptal, anterior, inferior,and apical myocardium. Prelim carotid dopplers 1-39% bilateral. SLP recommend DYS 2 diet and PT/OT recommended SNF. Patient was started on eliquis 5mg  BID on 05/16. Patient was discharged with Aspirin 81mg  daily and eliquis 5mg  BID. Patient will also restart home medication of Lisinopril, glipizide, and Januvia, and continue Lopressor on discharge. Since discharge patient has been readmitted last month with lower GI bleeding which was felt to be related to diverticulitis on colonoscopy. She required blood transfusion Her eliquis was stopped and was restarted only last Monday. She states that her speech is improved but she still has some mild weakness in the left leg. She has also noticed increased swelling in the legs. She is able to walk with a wheeled walker and is careful and has not had any falls. She is having minor bruising on eliquis and baby aspirin. She states her blood sugars all well controlled and fasting glucose is have ranged from 9210 range. Her blood pressure has been well controlled. ROS:   14 system review of systems is positive for  weakness, gait difficulty, imbalance, light sensitive, cough, runny nose, leg swelling, neck stiffness, anemia, easy bruising, bleeding and all other systems negative  PMH:  Past Medical History  Diagnosis Date  . Hypertension   . Hyperlipidemia   .  Chronic venous insufficiency   . Osteomyelitis     s/p Rt 2nd toe and left 5 toes  amputation in 1/12  by Dr. Sharol Given  . Diabetes mellitus   . Anemia     b12 def, iron def, follow at cancer center, gets B12 and  another injection  there. Could not remeber the name. Dr. Ralene Ok is her  cancer doctor  . Thyroid disease     hypothyroidism h/o hyperthyroidism s/p ablation/ectomy  . Asthma   . Blood transfusion     two or more yrs ago  . Hypothyroidism     thyroid removed 4 or more yrs ago  . Atrial fibrillation   . Chronic renal insufficiency   . MGUS (monoclonal gammopathy of unknown significance)     Social History:  Social History   Social History  . Marital Status: Widowed    Spouse Name: N/A  . Number of Children: N/A  . Years of Education: N/A   Occupational History  . Not on file.   Social History Main Topics  . Smoking status: Never Smoker   . Smokeless tobacco: Never Used  . Alcohol Use: No  . Drug Use: No  . Sexual Activity: Not on file   Other Topics Concern  . Not on file   Social History Narrative    Medications:   Current Outpatient Prescriptions on File Prior to Visit  Medication Sig Dispense Refill  . atorvastatin (LIPITOR) 40 MG tablet Take 1 tablet (40 mg total) by mouth daily at 6 PM. 30 tablet 3  . calcitRIOL (ROCALTROL) 0.25 MCG capsule Take 1 capsule by mouth every Monday, Wednesday, and Friday.  5  . Cholecalciferol (VITAMIN D3) 10000 UNITS capsule Take 1 capsule (10,000 Units total) by mouth once a week. 4 capsule 3  . ELIQUIS 5 MG TABS tablet Take 1 tablet (5 mg total) by mouth 2 (two) times daily. RESUME taking medication on 12/22/2014. 60 tablet 0  . glipiZIDE (GLUCOTROL) 10 MG tablet Take 0.5 tablets (5 mg total) by mouth 2 (two) times daily before a meal. 180 tablet 3  . JANUVIA 25 MG tablet TAKE 1 TABLET BY MOUTH EVERY DAY 90 tablet 3  . levothyroxine (SYNTHROID, LEVOTHROID) 112 MCG tablet Take 1 tablet (112 mcg total) by mouth daily before breakfast. 30 tablet 6  . lisinopril (PRINIVIL,ZESTRIL) 40 MG tablet TAKE 1 TABLET BY MOUTH EVERY DAY 90 tablet 0  . loratadine (CLARITIN) 10 MG tablet Take 10 mg by mouth daily. 10/10/14 Daily x 5 days then prn daily    . metoprolol  (LOPRESSOR) 25 MG tablet Take 1 tablet (25 mg total) by mouth 2 (two) times daily. 60 tablet 6  . pantoprazole (PROTONIX) 40 MG tablet Take 1 tablet (40 mg total) by mouth daily. 90 tablet 2  . sennosides-docusate sodium (SENOKOT-S) 8.6-50 MG tablet Take 2 tablets by mouth daily.    . polyethylene glycol (MIRALAX / GLYCOLAX) packet Take 17 g by mouth daily as needed for mild constipation or moderate constipation.      No current facility-administered medications on file prior to visit.    Allergies:  No Known Allergies  Physical Exam General: well developed, well nourished, elderly Caucasian lady seated, in no evident distress Head: head normocephalic and atraumatic.  Neck: supple with no carotid or supraclavicular bruits Cardiovascular: regular rate and rhythm, no murmurs Musculoskeletal: no deformity. Mild kyphosis Skin:  no rash/petichiae Vascular:  Normal pulses all extremities Filed Vitals:   01/02/15 1322  BP:  161/92  Pulse: 90   Neurologic Exam Mental Status: Awake and fully alert. Oriented to place and time. Recent and remote memory intact. Attention span, concentration and fund of knowledge appropriate. Mood and affect appropriate.  Cranial Nerves: Fundoscopic exam reveals sharp disc margins. Pupils equal, briskly reactive to light. Extraocular movements full without nystagmus. Visual fields full to confrontation. Hearing intact. Facial sensation intact. Mild left lower face weakness., Tongue, palate moves normally and symmetrically.  Motor: Normal bulk and tone. Normal strength in all tested extremity muscles. Diminished fine finger movements on the left. Orbits right over left upper extremity. Mild left grip weakness. Sensory.: intact to touch ,pinprick .position and vibratory sensation.  Coordination: Rapid alternating movements normal in all extremities. Finger-to-nose and heel-to-shin performed minimally impaired on the left.. Gait and Station: Arises from chair with slight  difficulty. Stance is stooped. Walks with a wheeled walker. Unable to heel, toe and tandem walk without difficulty.  Reflexes: 1+ and symmetric. Toes downgoing.   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     PLAN: I had a long d/w patient and daughter about her recent stroke, risk for recurrent stroke/TIAs, personally independently reviewed imaging studies and stroke evaluation results and answered questions.Continue Eliquis  for secondary stroke prevention but stop aspirin as I see no benefit and concern about increase bleeding given recent LGI bleed and maintain strict control of hypertension with blood pressure goal below 130/90, diabetes with hemoglobin A1c goal below 6.5% and lipids with LDL cholesterol goal below 100 mg/dL. I also advised the patient to eat a healthy diet with plenty of whole grains, cereals, fruits and vegetables, exercise regularly and maintain ideal body weight Followup in the future with me in 6 months or call earlier if necessary.  Antony Contras, MD  Note: This document was prepared with digital dictation and possible smart phrase technology. Any transcriptional errors that result from this process are unintentional

## 2015-01-02 NOTE — Patient Instructions (Signed)
I had a long d/w patient and daughter about her recent stroke, risk for recurrent stroke/TIAs, personally independently reviewed imaging studies and stroke evaluation results and answered questions.Continue Eliquis  for secondary stroke prevention but stop aspirin as I see no benefit and concern about increase bleeding given recent LGI bleed and maintain strict control of hypertension with blood pressure goal below 130/90, diabetes with hemoglobin A1c goal below 6.5% and lipids with LDL cholesterol goal below 100 mg/dL. I also advised the patient to eat a healthy diet with plenty of whole grains, cereals, fruits and vegetables, exercise regularly and maintain ideal body weight Followup in the future with me in 6 months or call earlier if necessary. Stroke Prevention Some medical conditions and behaviors are associated with an increased chance of having a stroke. You may prevent a stroke by making healthy choices and managing medical conditions. HOW CAN I REDUCE MY RISK OF HAVING A STROKE?   Stay physically active. Get at least 30 minutes of activity on most or all days.  Do not smoke. It may also be helpful to avoid exposure to secondhand smoke.  Limit alcohol use. Moderate alcohol use is considered to be:  No more than 2 drinks per day for men.  No more than 1 drink per day for nonpregnant women.  Eat healthy foods. This involves:  Eating 5 or more servings of fruits and vegetables a day.  Making dietary changes that address high blood pressure (hypertension), high cholesterol, diabetes, or obesity.  Manage your cholesterol levels.  Making food choices that are high in fiber and low in saturated fat, trans fat, and cholesterol may control cholesterol levels.  Take any prescribed medicines to control cholesterol as directed by your health care provider.  Manage your diabetes.  Controlling your carbohydrate and sugar intake is recommended to manage diabetes.  Take any prescribed  medicines to control diabetes as directed by your health care provider.  Control your hypertension.  Making food choices that are low in salt (sodium), saturated fat, trans fat, and cholesterol is recommended to manage hypertension.  Take any prescribed medicines to control hypertension as directed by your health care provider.  Maintain a healthy weight.  Reducing calorie intake and making food choices that are low in sodium, saturated fat, trans fat, and cholesterol are recommended to manage weight.  Stop drug abuse.  Avoid taking birth control pills.  Talk to your health care provider about the risks of taking birth control pills if you are over 92 years old, smoke, get migraines, or have ever had a blood clot.  Get evaluated for sleep disorders (sleep apnea).  Talk to your health care provider about getting a sleep evaluation if you snore a lot or have excessive sleepiness.  Take medicines only as directed by your health care provider.  For some people, aspirin or blood thinners (anticoagulants) are helpful in reducing the risk of forming abnormal blood clots that can lead to stroke. If you have the irregular heart rhythm of atrial fibrillation, you should be on a blood thinner unless there is a good reason you cannot take them.  Understand all your medicine instructions.  Make sure that other conditions (such as anemia or atherosclerosis) are addressed. SEEK IMMEDIATE MEDICAL CARE IF:   You have sudden weakness or numbness of the face, arm, or leg, especially on one side of the body.  Your face or eyelid droops to one side.  You have sudden confusion.  You have trouble speaking (aphasia) or understanding.  You have sudden trouble seeing in one or both eyes.  You have sudden trouble walking.  You have dizziness.  You have a loss of balance or coordination.  You have a sudden, severe headache with no known cause.  You have new chest pain or an irregular  heartbeat. Any of these symptoms may represent a serious problem that is an emergency. Do not wait to see if the symptoms will go away. Get medical help at once. Call your local emergency services (911 in [place] Do not drive yourself to the hospital. Document Released: 06/12/2004 Document Revised: 09/19/2013 Document Reviewed: 11/05/2012 [facility] Patient Information 2015 [place], [place]. This information is not intended to replace advice given to you by your health care provider. Make sure you discuss any questions you have with your health care provider.

## 2015-01-11 ENCOUNTER — Other Ambulatory Visit: Payer: Self-pay | Admitting: Nurse Practitioner

## 2015-01-11 ENCOUNTER — Other Ambulatory Visit: Payer: Self-pay | Admitting: Internal Medicine

## 2015-01-16 ENCOUNTER — Other Ambulatory Visit: Payer: Self-pay | Admitting: Nurse Practitioner

## 2015-01-17 ENCOUNTER — Ambulatory Visit: Payer: Medicare Other | Admitting: Internal Medicine

## 2015-01-26 ENCOUNTER — Other Ambulatory Visit: Payer: Self-pay | Admitting: Internal Medicine

## 2015-01-31 ENCOUNTER — Ambulatory Visit (INDEPENDENT_AMBULATORY_CARE_PROVIDER_SITE_OTHER): Payer: Medicare Other | Admitting: Internal Medicine

## 2015-01-31 ENCOUNTER — Encounter: Payer: Self-pay | Admitting: Internal Medicine

## 2015-01-31 VITALS — BP 148/72 | HR 86 | Temp 98.0°F | Wt 197.3 lb

## 2015-01-31 DIAGNOSIS — N184 Chronic kidney disease, stage 4 (severe): Secondary | ICD-10-CM | POA: Diagnosis not present

## 2015-01-31 DIAGNOSIS — E559 Vitamin D deficiency, unspecified: Secondary | ICD-10-CM

## 2015-01-31 DIAGNOSIS — I129 Hypertensive chronic kidney disease with stage 1 through stage 4 chronic kidney disease, or unspecified chronic kidney disease: Secondary | ICD-10-CM | POA: Diagnosis not present

## 2015-01-31 DIAGNOSIS — E1122 Type 2 diabetes mellitus with diabetic chronic kidney disease: Secondary | ICD-10-CM | POA: Diagnosis not present

## 2015-01-31 DIAGNOSIS — D631 Anemia in chronic kidney disease: Secondary | ICD-10-CM

## 2015-01-31 DIAGNOSIS — E1142 Type 2 diabetes mellitus with diabetic polyneuropathy: Secondary | ICD-10-CM

## 2015-01-31 DIAGNOSIS — R600 Localized edema: Secondary | ICD-10-CM

## 2015-01-31 DIAGNOSIS — E039 Hypothyroidism, unspecified: Secondary | ICD-10-CM

## 2015-01-31 DIAGNOSIS — K922 Gastrointestinal hemorrhage, unspecified: Secondary | ICD-10-CM

## 2015-01-31 DIAGNOSIS — E1140 Type 2 diabetes mellitus with diabetic neuropathy, unspecified: Secondary | ICD-10-CM

## 2015-01-31 DIAGNOSIS — I10 Essential (primary) hypertension: Secondary | ICD-10-CM

## 2015-01-31 DIAGNOSIS — R609 Edema, unspecified: Secondary | ICD-10-CM

## 2015-01-31 LAB — GLUCOSE, CAPILLARY: GLUCOSE-CAPILLARY: 104 mg/dL — AB (ref 65–99)

## 2015-01-31 MED ORDER — LEVOTHYROXINE SODIUM 112 MCG PO TABS
112.0000 ug | ORAL_TABLET | Freq: Every day | ORAL | Status: DC
Start: 2015-01-31 — End: 2015-10-24

## 2015-01-31 MED ORDER — FUROSEMIDE 20 MG PO TABS: 20.0000 mg | ORAL_TABLET | Freq: Every day | ORAL | Status: DC

## 2015-01-31 NOTE — Patient Instructions (Signed)
General Instructions: Please schedule a follow up visit within the next 2 months.   For your medications:   Please bring all of your pill  Bottles with you to each visit.  This will help make sure that we have an up to date list of all the medications you are taking.  Please also bring any over the counter herbal medications you are taking (not including advil, tylenol, etc.)  Please continue taking your medications as prescribed.   Please start taking Furosemide (Lasix) 20mg  daily for your swelling.    Please stop taking aspirin.    Also for your swelling, sit with your feet elevated to the level of your heart and try compression stockings to the knees.   You can get these type of stockings at the drug store.    Please call with any questions.  [phone]  Thank you!   Treatment Goals:  Goals (1 Years of Data) as of 01/31/15    None      Progress Toward Treatment Goals:  Treatment Goal 01/31/2015  Hemoglobin A1C at goal  Blood pressure unchanged    Self Care Goals & Plans:  Self Care Goal 01/31/2015  Manage my medications bring my medications to every visit; refill my medications on time; take my medicines as prescribed  Monitor my health keep track of my blood glucose; keep track of my blood pressure; bring my glucose meter and log to each visit  Eat healthy foods eat more vegetables; eat baked foods instead of fried foods; eat foods that are low in salt  Be physically active find an activity I enjoy    Home Blood Glucose Monitoring 10/25/2012  Check my blood sugar (No Data)  When to check my blood sugar before breakfast     Care Management & Community Referrals:  Referral 10/25/2012  Referrals made for care management support none needed

## 2015-01-31 NOTE — Progress Notes (Signed)
Subjective:    Patient ID: [name], female    DOB: 07-14-1934, 80 y.o.   MRN: [id number]  CC: Hospital follow up  HPI  [name] is a 80yo woman with PMH of embolic CVA, recent GIB due to gastritis, MGUS and chronic anemia, CKD, DM with neuropathy s/p amputations, HLD, Hypothyroidism who presents for follow up after hospitalization for GIB.    In July of this year, she was hospitalized for a GIB.  She had melena and was found to be on aspirin and eliquis.  Aspirin was stopped by Neurology.  Upper endoscopy found gastritis, which was thought to be cause of bleeding.  Today she reports that she did stop her aspirin.  She has had no further black stools and her daughter reports she is mostly back to her baseline.  She is not feeling as poorly as before.  She continues with intermittent sleepiness which she has had for many years.    On our last visit, [name] had a callus on her foot, which was concerning given her history of amputations, today she reports that the callus is still there, but she missed her appointment with Dr. Sharol Given due to being in the hospital.  She needs new diabetic shoes, but has had trouble getting them.  Orders were sent in for her a few visits ago.   Also on our last visit, she had a low blood sugar and declined to stay and have it repeated as she had not eaten breakfast.  She also has intermittent sleepiness.  Today she reports that her blood sugars are okay.  She doesn't check them every day, but does not have any symptoms of low blood sugars.  Today her daughter reports that it was 102 this morning, and it was similar here in clinic.  I asked her to bring in her meter at next visit.    One complaint today is LE swelling.  She has had this intermittently before, but it seems to be getting worse.  She has been diagnosed with venous insufficiency.  In the hospital for her stroke earlier this year, she had a TTE which reported EF of 35-40% and this does not appear to have  been followed up in the outpatient setting.   During that hospitalization, she was having Afib with RVR which could explain some of the change compared to TTE from 2011.  Today, her HR is normal.  She has an irreg irreg rhythm.  She is not complaining of any chest pain, orthopnea, SOB.  She has not been treated for LE swelling currently.  No injury to the leg, no change in skin.    Other chronic issues include HLD for which she is on atorvastatin now since her stroke.  She does not complain of any side effects today.  She also has hypothyroidism for which she takes synthroid and needs a refill.    Review of Systems  Constitutional: Positive for fatigue (chronic). Negative for fever and chills.  HENT: Positive for drooling (from left facial droop. ). Negative for ear discharge, ear pain, hearing loss and mouth sores.   Eyes: Negative for photophobia and visual disturbance.  Respiratory: Negative for cough, choking, shortness of breath and wheezing.   Cardiovascular: Positive for leg swelling. Negative for chest pain and palpitations.  Gastrointestinal: Negative for abdominal pain, diarrhea and constipation.  Genitourinary: Positive for enuresis (from incontinence.  Wears depends sometimes). Negative for dysuria, urgency and difficulty urinating.  Musculoskeletal: Negative for back  pain and arthralgias.  Skin: Positive for wound (bottom of right foot). Negative for pallor and rash.  Neurological: Positive for weakness. Negative for dizziness, light-headedness, numbness and headaches.  Psychiatric/Behavioral: Negative for confusion and decreased concentration.       Objective:   Physical Exam  Constitutional: She is oriented to person, place, and time.  Elderly woman, more alert than previous visits  HENT:  Head: Normocephalic and atraumatic.  Mouth/Throat: No oropharyngeal exudate.  Neck: Neck supple.  Cardiovascular: An irregularly irregular rhythm present.  No extrasystoles are present.  PMI is not displaced.   No murmur heard. Pulmonary/Chest: Effort normal and breath sounds normal. No respiratory distress. She has no wheezes. She has no rales.  Abdominal: Soft. Bowel sounds are normal. She exhibits no distension.  Musculoskeletal: She exhibits edema (pitting, 2+ to mid calf.  Also with tender callus on dorsum of right foot with pockets of red surrounds, no warmth, appear to possibly be blood blisters) and tenderness.  Lymphadenopathy:    She has no cervical adenopathy.  Neurological: She is alert and oriented to person, place, and time. A cranial nerve deficit (left facial droop, corrects with smile.  Mild leftward deviation of tongue.  Strength intact in upper and lower extremities) is present. She exhibits normal muscle tone. Coordination normal.  Skin: Skin is warm and dry. No erythema.  She has the callus as noted above, but no surrounding erythema or warmth  Psychiatric: She has a normal mood and affect. Her behavior is normal.  Vitals reviewed.   CBC, BMET, vitamin D, TSH      Assessment & Plan:  2 months, sooner if any abnl labwork or if needed.

## 2015-01-31 NOTE — Progress Notes (Signed)
Appt w/Dr Sharol Given has been scheduled on Sept 30th @ 0830AM; appt card given to pt/pt's daughter.

## 2015-02-01 ENCOUNTER — Telehealth: Payer: Self-pay | Admitting: *Deleted

## 2015-02-01 LAB — CBC WITH DIFFERENTIAL/PLATELET
Basophils Absolute: 0 10*3/uL (ref 0.0–0.2)
Basos: 0 %
EOS (ABSOLUTE): 0.5 10*3/uL — ABNORMAL HIGH (ref 0.0–0.4)
EOS: 7 %
HEMATOCRIT: 28.3 % — AB (ref 34.0–46.6)
Hemoglobin: 8.8 g/dL — ABNORMAL LOW (ref 11.1–15.9)
Immature Grans (Abs): 0 10*3/uL (ref 0.0–0.1)
Immature Granulocytes: 0 %
LYMPHS ABS: 1.2 10*3/uL (ref 0.7–3.1)
Lymphs: 18 %
MCH: 28.2 pg (ref 26.6–33.0)
MCHC: 31.1 g/dL — AB (ref 31.5–35.7)
MCV: 91 fL (ref 79–97)
MONOS ABS: 0.9 10*3/uL (ref 0.1–0.9)
Monocytes: 13 %
NEUTROS ABS: 4.2 10*3/uL (ref 1.4–7.0)
Neutrophils: 62 %
Platelets: 166 10*3/uL (ref 150–379)
RBC: 3.12 x10E6/uL — ABNORMAL LOW (ref 3.77–5.28)
RDW: 15.4 % (ref 12.3–15.4)
WBC: 6.9 10*3/uL (ref 3.4–10.8)

## 2015-02-01 LAB — VITAMIN D 25 HYDROXY (VIT D DEFICIENCY, FRACTURES): Vit D, 25-Hydroxy: 17.9 ng/mL — ABNORMAL LOW (ref 30.0–100.0)

## 2015-02-01 LAB — BMP8+ANION GAP
Anion Gap: 20 mmol/L — ABNORMAL HIGH (ref 10.0–18.0)
BUN / CREAT RATIO: 17 (ref 11–26)
BUN: 44 mg/dL — ABNORMAL HIGH (ref 8–27)
CO2: 19 mmol/L (ref 18–29)
CREATININE: 2.66 mg/dL — AB (ref 0.57–1.00)
Calcium: 8.2 mg/dL — ABNORMAL LOW (ref 8.7–10.3)
Chloride: 103 mmol/L (ref 97–108)
GFR calc non Af Amer: 17 mL/min/{1.73_m2} — ABNORMAL LOW (ref >59)
GFR, EST AFRICAN AMERICAN: 19 mL/min/{1.73_m2} — AB (ref >59)
Glucose: 105 mg/dL — ABNORMAL HIGH (ref 65–99)
Potassium: 5.4 mmol/L — ABNORMAL HIGH (ref 3.5–5.2)
SODIUM: 142 mmol/L (ref 134–144)

## 2015-02-01 LAB — TSH: TSH: 0.522 u[IU]/mL (ref 0.450–4.500)

## 2015-02-01 NOTE — Assessment & Plan Note (Signed)
Patient is doing well and appears less fatigued than last time I saw her.  We discussed her EGD and Colonoscopy results.  She is only taking eliquis and not aspirin anymore based on Neurology recommendations.  She is taking her PPI.    Her Hgb today is stable.  She is no longer having any signs/symptoms of GI bleeding.  Continue to monitor.  Continue eliquis.  We discussed symptoms to watch out for for further bleeding.

## 2015-02-01 NOTE — Assessment & Plan Note (Signed)
This is not a new issue for [name], however, it has in the past been attributed to venous insufficiency.  She reports that it is getting worse and she does have pitting edema on exam.  Her last TTE done during the time of her stroke showed a lowered EF, which was a new finding.  She has an EKG around that time which also noted afib with RVR, which could explain some of the worsened function.  Given her edema, will start lasix 20mg  today and see if this helps.  We also discussed compression stockings and leg elevation.   Her HR is well controlled today.  Consider repeating TTE in the next few months to confirm lowered EF.

## 2015-02-01 NOTE — Telephone Encounter (Signed)
Attempted to contact patient and let her know that her pcp has requested she follow up in the clinic on Monday for stat labs and to see MD.  No answer, message left on recorder.Despina Hidden Cassady9/15/20165:05 PM

## 2015-02-01 NOTE — Assessment & Plan Note (Signed)
BP Readings from Last 3 Encounters:  01/31/15 148/72  01/02/15 161/92  12/17/14 158/91    Lab Results  Component Value Date   NA 142 01/31/2015   K 5.4* 01/31/2015   CREATININE 2.66* 01/31/2015    Assessment: Blood pressure control: mildly elevated Progress toward BP goal:  unchanged Comments: Better than previous, but still too high given her recent stroke  Plan: Medications:  continue current medications Educational resources provided:   Self management tools provided:   Other plans: Consider adding another medication at next visit if warranted.

## 2015-02-01 NOTE — Assessment & Plan Note (Signed)
TSH check today, refill synthroid.

## 2015-02-01 NOTE — Assessment & Plan Note (Signed)
Renal function worse compared to previous.  She also has worsening LE edema.  Likely differential includes worsening renal function due to uncontrolled high blood pressure or CHF as consistent with her recent TTE.    She will be started on lasix 20mg  daily today, which may help.  Will call patient and have her come in for lab work on Friday if possible to repeat BMET.  She also has an elevated AG which may be related to her worsening renal function.

## 2015-02-01 NOTE — Addendum Note (Signed)
Addended by: [name] B on: 02/01/2015 03:02 PM   Modules accepted: Orders

## 2015-02-01 NOTE — Assessment & Plan Note (Addendum)
Blood sugars have been in the 100s at home.  She only checks occasionally.  She has DM peripheral neuropathy and a callus on the bottom of the right foot.  This is tender and somewhat worsened since the last time I saw her.  She does not have any concerning signs of infection today.  WBC and differential normal on CBC.  Will help her schedule follow up with Dr. Sharol Given for further evaluation.    I have asked her again to bring in her BS log.  At next [facility] check, if she is below 7, will plan to decrease glipizide.  I do not think she needs to be so tightly controlled given her recent health issues and multiple hospitalizations.

## 2015-02-28 ENCOUNTER — Other Ambulatory Visit (HOSPITAL_BASED_OUTPATIENT_CLINIC_OR_DEPARTMENT_OTHER): Payer: Medicare Other

## 2015-02-28 DIAGNOSIS — D631 Anemia in chronic kidney disease: Secondary | ICD-10-CM | POA: Diagnosis not present

## 2015-02-28 DIAGNOSIS — N183 Chronic kidney disease, stage 3 (moderate): Secondary | ICD-10-CM | POA: Diagnosis not present

## 2015-02-28 DIAGNOSIS — D472 Monoclonal gammopathy: Secondary | ICD-10-CM

## 2015-02-28 LAB — COMPREHENSIVE METABOLIC PANEL (CC13)
ALK PHOS: 103 U/L (ref 40–150)
ALT: <9 U/L (ref 0–55)
AST: 14 U/L (ref 5–34)
Albumin: 3.7 g/dL (ref 3.5–5.0)
Anion Gap: 11 mEq/L (ref 3–11)
BUN: 44.2 mg/dL — ABNORMAL HIGH (ref 7.0–26.0)
CALCIUM: 8.9 mg/dL (ref 8.4–10.4)
CO2: 21 mEq/L — ABNORMAL LOW (ref 22–29)
CREATININE: 2.8 mg/dL — AB (ref 0.6–1.1)
Chloride: 110 mEq/L — ABNORMAL HIGH (ref 98–109)
EGFR: 18 mL/min/{1.73_m2} — ABNORMAL LOW (ref >90)
Glucose: 96 mg/dl (ref 70–140)
Potassium: 4.6 mEq/L (ref 3.5–5.1)
SODIUM: 142 meq/L (ref 136–145)
TOTAL PROTEIN: 7.6 g/dL (ref 6.4–8.3)
Total Bilirubin: 0.67 mg/dL (ref 0.20–1.20)

## 2015-02-28 LAB — CBC & DIFF AND RETIC
BASO%: 0.4 % (ref 0.0–2.0)
BASOS ABS: 0 10*3/uL (ref 0.0–0.1)
EOS%: 6.2 % (ref 0.0–7.0)
Eosinophils Absolute: 0.5 10*3/uL (ref 0.0–0.5)
HCT: 28.5 % — ABNORMAL LOW (ref 34.8–46.6)
HGB: 9 g/dL — ABNORMAL LOW (ref 11.6–15.9)
IMMATURE RETIC FRACT: 8.1 % (ref 1.60–10.00)
LYMPH%: 15.9 % (ref 14.0–49.7)
MCH: 27.9 pg (ref 25.1–34.0)
MCHC: 31.6 g/dL (ref 31.5–36.0)
MCV: 88.2 fL (ref 79.5–101.0)
MONO#: 0.8 10*3/uL (ref 0.1–0.9)
MONO%: 10.7 % (ref 0.0–14.0)
NEUT%: 66.8 % (ref 38.4–76.8)
NEUTROS ABS: 5.1 10*3/uL (ref 1.5–6.5)
Platelets: 146 10*3/uL (ref 145–400)
RBC: 3.23 10*6/uL — ABNORMAL LOW (ref 3.70–5.45)
RDW: 15 % — ABNORMAL HIGH (ref 11.2–14.5)
RETIC %: 1.27 % (ref 0.70–2.10)
Retic Ct Abs: 41.02 10*3/uL (ref 33.70–90.70)
WBC: 7.6 10*3/uL (ref 3.9–10.3)
lymph#: 1.2 10*3/uL (ref 0.9–3.3)

## 2015-02-28 NOTE — Telephone Encounter (Addendum)
Late Entry: Message was left on pt's recorder on 09/15-no answer, message left.  Pt was again contacted on 09/16 and told me that she did not have a ride to clinic, but will contact me back when she finds out when her children could bring her. Pt was again contacted-no answer, message left on recorder. Attempted to contact pt again today, no answer, no message left.  Upon further review of chart, pt had labs drawn today at [facility].  Creatinine 2.8 up from 2.66 4wks ago. Pt was again contacted to return to clinic to follow up abnormal labs.  Pt doesn't want to come in (transportation issues) and asked if we could address her abnormal labs over the phone without a visit.  Will send request to MD for review and/or to contact patient about results.  Please advise.Despina Hidden Cassady10/12/201611:53 AM

## 2015-03-01 NOTE — Telephone Encounter (Signed)
There is really no way to address without knowing what her Cr is.  If she is continuing to worsen her Cr and her swelling is worse, she will likely need to see a nephrologist or come in to the hospital.  I am not very clear as to why she is having such an acute rise in her Creatinine.  I understand she has transportation issues, but this is a concerning trend and could lead to renal failure.    I am on the inpatient service and will likely not be able to call her back today during business hours.  If she is willing to go to the cancer center for labs, this can be done, however, there is no actual way to assess her kidney function without checking a BMET.  She doesn't need an appointment necessarily unless she has worsening issues.  Ulis Rias - - would you mind calling her and addressing with her, would appreciate it.   Thanks

## 2015-03-02 LAB — SPEP & IFE WITH QIG
ABNORMAL PROTEIN BAND1: 0.2 g/dL
ALPHA-2-GLOBULIN: 0.8 g/dL (ref 0.5–0.9)
Albumin ELP: 4 g/dL (ref 3.8–4.8)
Alpha-1-Globulin: 0.4 g/dL — ABNORMAL HIGH (ref 0.2–0.3)
BETA GLOBULIN: 0.7 g/dL — AB (ref 0.4–0.6)
Beta 2: 0.4 g/dL (ref 0.2–0.5)
Gamma Globulin: 1.2 g/dL (ref 0.8–1.7)
IgA: 566 mg/dL — ABNORMAL HIGH (ref 69–380)
IgG (Immunoglobin G), Serum: 1250 mg/dL (ref 690–1700)
IgM, Serum: 42 mg/dL — ABNORMAL LOW (ref 52–322)
Total Protein, Serum Electrophoresis: 7.4 g/dL (ref 6.1–8.1)

## 2015-03-07 ENCOUNTER — Telehealth: Payer: Self-pay | Admitting: Hematology

## 2015-03-07 ENCOUNTER — Ambulatory Visit (HOSPITAL_BASED_OUTPATIENT_CLINIC_OR_DEPARTMENT_OTHER): Payer: Medicare Other | Admitting: Hematology

## 2015-03-07 ENCOUNTER — Ambulatory Visit (HOSPITAL_BASED_OUTPATIENT_CLINIC_OR_DEPARTMENT_OTHER): Payer: Medicare Other

## 2015-03-07 ENCOUNTER — Encounter: Payer: Self-pay | Admitting: Hematology

## 2015-03-07 VITALS — BP 153/98 | HR 118 | Temp 97.9°F | Resp 18 | Ht 67.0 in | Wt 195.5 lb

## 2015-03-07 DIAGNOSIS — I4891 Unspecified atrial fibrillation: Secondary | ICD-10-CM

## 2015-03-07 DIAGNOSIS — L97829 Non-pressure chronic ulcer of other part of left lower leg with unspecified severity: Secondary | ICD-10-CM

## 2015-03-07 DIAGNOSIS — D631 Anemia in chronic kidney disease: Secondary | ICD-10-CM | POA: Diagnosis not present

## 2015-03-07 DIAGNOSIS — N183 Chronic kidney disease, stage 3 (moderate): Secondary | ICD-10-CM | POA: Diagnosis not present

## 2015-03-07 DIAGNOSIS — D472 Monoclonal gammopathy: Secondary | ICD-10-CM | POA: Diagnosis not present

## 2015-03-07 DIAGNOSIS — Z8673 Personal history of transient ischemic attack (TIA), and cerebral infarction without residual deficits: Secondary | ICD-10-CM

## 2015-03-07 MED ORDER — DARBEPOETIN ALFA 200 MCG/0.4ML IJ SOSY
200.0000 ug | PREFILLED_SYRINGE | Freq: Once | INTRAMUSCULAR | Status: AC
  Administered 2015-03-07: 200 ug via SUBCUTANEOUS
  Filled 2015-03-07: qty 0.4

## 2015-03-07 MED ORDER — CEPHALEXIN 500 MG PO CAPS: 500.0000 mg | ORAL_CAPSULE | Freq: Three times a day (TID) | ORAL | Status: DC

## 2015-03-07 NOTE — Progress Notes (Signed)
[facility] OFFICE PROGRESS NOTE  [name], MD [place] [address]  DIAGNOSIS: Anemia, unspecified anemia type  CHIEF COMPLAIN: Follow up MGUS and anemia.  CURRENT THERAPY: Vitamin B12 shots every 8 weeks and aranesp 200 mcg every 4 weeks for a hemoglobin less than or equal to 11, started on 01/09/2013. Held after 07/2014 due to her stroke,  and restarted on 03/07/2015   PROBLEM LIST:  1. Chronic anemia.  2. Renal insufficiency.  3. Atrial fibrillation.  4. 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  5. Low vitamin B12 level.  6. Osteomyelitis of the left foot status post left mid foot amputation  on 05/30/2010.  7. Status post amputation of toes of right foot in December 2012.  8. Hypertension.  9. Diabetes mellitus.  10.Venous insufficiency.  11.Dyslipidemia.  12.Status post total thyroidectomy for multinodular goiter on  03/13/2006.  13.History of asthma.  14.Thrombocytopenia with variable platelet count first noted 08/2010,  suspect chronic idiopathic thrombocytopenic purpura.  15. 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  INTERVAL HISTORY: [name] 80 y.o. female with a history of chronic kidney disease complicated by anemia and Vitmain B12 deficiency is here for follow-up.    She is doing better overall. She is able to do her all ADLs, and tolerating routine activities at home. She is not very active. She denies any new pain, has moderate fatigue, appetite is fair. She reports a large skin blister at her left LE below knee, it became a shallow skin ulcer now. No fever or chill, no other skin lesions.   MEDICAL HISTORY: Past Medical History  Diagnosis Date  . Hypertension   . Hyperlipidemia   . Chronic venous insufficiency   . Osteomyelitis ([place])     s/p Rt 2nd toe and left 5 toes  amputation in 1/12  by Dr. Sharol Given  . Diabetes mellitus   . Anemia      b12 def, iron def, follow at cancer center, gets B12 and  another injection there. Could not remeber the name. Dr. Ralene Ok is her  cancer doctor  . Thyroid disease     hypothyroidism h/o hyperthyroidism s/p ablation/ectomy  . Asthma   . Blood transfusion     two or more yrs ago  . Hypothyroidism     thyroid removed 4 or more yrs ago  . Atrial fibrillation ([place])   . Chronic renal insufficiency   . MGUS (monoclonal gammopathy of unknown significance)      ALLERGIES:  has No Known Allergies.  MEDICATIONS: has a current medication list which includes the following prescription(s): atorvastatin, calcitriol, vitamin d3, eliquis, furosemide, glipizide, januvia, levothyroxine, lisinopril, loratadine, metoprolol tartrate, pantoprazole, polyethylene glycol, and sennosides-docusate sodium.  SURGICAL HISTORY:  Past Surgical History  Procedure Laterality Date  . Eye surgery      cat ext ou  . Appendectomy      teenager  . Amputation  04/30/2011    Procedure: AMPUTATION FOOT;  Surgeon: Newt Minion, MD;  Location: [facility];  Service: Orthopedics;  Laterality: Right;  Right Midfoot Amputation  . Esophagogastroduodenoscopy N/A 12/15/2014    Procedure: ESOPHAGOGASTRODUODENOSCOPY (EGD);  Surgeon: Ladene Artist, MD;  Location: [facility] ENDOSCOPY;  Service: Endoscopy;  Laterality: N/A;  . Colonoscopy N/A 12/17/2014    Procedure: COLONOSCOPY;  Surgeon: Ladene Artist, MD;  Location[facility] ENDOSCOPY;  Service: Endoscopy;  Laterality: N/A;  REVIEW OF SYSTEMS:   Constitutional: Denies fevers, chills or abnormal weight loss Eyes: Denies blurriness of vision Ears, nose, mouth, throat, and face: Denies mucositis or sore throat Respiratory: Denies cough, dyspnea or wheezes Cardiovascular: Denies palpitation, chest discomfort or lower extremity swelling Gastrointestinal:  Denies nausea, heartburn or change in bowel habits Skin: Denies abnormal skin rashes Lymphatics: Denies new lymphadenopathy or easy  bruising Neurological:Denies numbness, tingling or new weaknesses Behavioral/Psych: Mood is stable, no new changes  All other systems were reviewed with the patient and are negative.  PHYSICAL EXAMINATION: ECOG PERFORMANCE STATUS: 3  Blood pressure 153/98, pulse 118, temperature 97.9 F (36.6 C), temperature source Oral, resp. rate 18, height _0  (1.702 m), weight 195 lb 8 oz (88.678 kg), SpO2 99 %.  GENERAL:alert, no distress and comfortable; chronically ill elderly female who uses a walker.  SKIN: skin color, texture, turgor are normal, no rashes or significant lesions, except a 2-3cm large shallow skin ulcer in left LE below knee, with surrounding skin erythema, no significant discharge  EYES: normal, Conjunctiva are pink and non-injected, sclera clear OROPHARYNX:no exudate, no erythema and lips, buccal mucosa, and tongue normal  NECK: supple, thyroid normal size, non-tender, without nodularity LYMPH:  no palpable lymphadenopathy in the cervical, axillary or supraclavicular LUNGS: clear to auscultation and percussion with normal breathing effort HEART: irregular rate & rhythm and no murmurs and 1+ lower extremity edema ABDOMEN:abdomen soft, non-tender and normal bowel sounds Musculoskeletal:no cyanosis of digits and no clubbing; s/p bilateral toes amputations as noted above.  NEURO: alert & oriented x 3 with fluent speech, ambulates via a wheelchair, she has mild left arm weakness, other cranial nerve exam were unremarkable, no focal motor or sensation deficiency.  LABORATORY DATA: CBC Latest Ref Rng 02/28/2015 01/31/2015 12/17/2014  WBC 3.9 - 10.3 10e3/uL 7.6 6.9 8.8  Hemoglobin 11.6 - 15.9 g/dL 9.0(L) - 9.8(L)  Hematocrit 34.8 - 46.6 % 28.5(L) 28.3(L) 30.5(L)  Platelets 145 - 400 10e3/uL 146 - 152    CMP Latest Ref Rng 02/28/2015 01/31/2015 12/17/2014  Glucose 70 - 140 mg/dl 96 105(H) 126(H)  BUN 7.0 - 26.0 mg/dL 44.2(H) 44(H) 36(H)  Creatinine 0.6 - 1.1 mg/dL 2.8(H) 2.66(H)  2.09(H)  Sodium 136 - 145 mEq/L 142 142 139  Potassium 3.5 - 5.1 mEq/L 4.6 5.4(H) 4.3  Chloride 97 - 108 mmol/L - 103 108  CO2 22 - 29 mEq/L 21(L) 19 21(L)  Calcium 8.4 - 10.4 mg/dL 8.9 8.2(L) 8.6(L)  Total Protein 6.4 - 8.3 g/dL 7.6 - -  Total Bilirubin 0.20 - 1.20 mg/dL 0.67 - -  Alkaline Phos 40 - 150 U/L 103 - -  AST 5 - 34 U/L 14 - -  ALT 0 - 55 U/L <9 - -   M-protein  02/28/2015: 0.2  IgA (69-380 mg/dl) 02/28/2015: 566  RADIOGRAPHIC STUDIES: 1. Digital screening mammogram on 07/26/2010 was negative.  2. MRI of the right forefoot without IV contrast on 08/20/2010 showed  apparent new soft tissue ulceration distal to the third metatarsal status post prior resection of the third toe. No soft tissue abscess was demonstrated. There was interval resolution of the previously demonstrated marrow edema within the third metatarsal. No evidence for osteomyelitis. There was stable mid foot degenerative changes and chronic myopathic changes.  3. CT of the cervical spine without IV contrast on 10/21/2010 showed  degenerative changes in the cervical spine. No displaced fractures  were identified.  4. CT of the head without IV contrast on 10/21/2010 showed no  evidence  for acute intracranial hemorrhage, mass lesion or acute infarct. There was some mild diffuse atrophy. There was a subcutaneous hematoma over the left anterior frontal/periorbital region. No underlying skull fracture or acute intracranial abnormality.  5. Chest x-ray, 2 view, from 04/28/2011 showed stable moderate  cardiomegaly with no active lung disease. There was probable mild  scarring at the lung bases.   ASSESSMENT: [name] 80 y.o. female with a history of multiple comorbidities, including anemia, IgA MGUS, renal failure, B12 deficiency, etc.  1. Anemia and MGUS, rule out MM  --her anemia has been getting slightly worse over the past year, despite Arenasp injection every 1-2 months. -Given her history of IgA MGUS,  renal failure, and worsening anemia, multiple myeloma needs to be ruled out. Her previous M protein level was low, IgA and light chain levels are elevated. -Her Skeletal survey was normal.  -I have discussed bone marrow biopsy several times, and she repeatedly declined due to the concern of complications from procedure such as pain. I'll hold on for now. -Her hemoglobin is  9.0 today, anemia slightly worse. She is now on Eliquis now, has been 5 months out of her stroke, I recommend her to restart Aranesp, at a lower dose 234mg every 4 weeks. She agrees.   2. Left leg skin ulcer and probably infection  -I gave her a prescription of keflex today  -she knows to follow up with her PCP if it does not improved with antibiotics   3. Right front lobe CVA in 09/2014 -likely related to her AF without anticoagulation .  -She is recovering slowly,  on home PT -She is on a eliquis now   4. Hypertension, diabetes, A. Fib, CKD, and other medical problems -She will continue follow-up with her primary care physician. She is on Eliquis now for A. Fib  All questions were answered. The patient knows to call the clinic with any problems, questions or concerns. We can certainly see the patient much sooner if necessary.  Plan -restart Aranesp today and continue monthly  -Return to clinic in 3 months -keflex for left leg   I spent 20 minutes counseling the patient face to face. The total time spent in the appointment was 25 minutes.    FTruitt Merle MD 03/07/2015   9:09 AM

## 2015-03-07 NOTE — Telephone Encounter (Signed)
per pof to sch pt appt-cld pt-pt req for avs to be mailed-mailed to pt address on file

## 2015-03-30 ENCOUNTER — Other Ambulatory Visit: Payer: Self-pay | Admitting: Internal Medicine

## 2015-03-31 ENCOUNTER — Other Ambulatory Visit: Payer: Self-pay | Admitting: Internal Medicine

## 2015-04-02 NOTE — Telephone Encounter (Signed)
Looks like a duplicate, refills on file

## 2015-04-03 ENCOUNTER — Telehealth: Payer: Self-pay | Admitting: Hematology

## 2015-04-03 ENCOUNTER — Other Ambulatory Visit: Payer: Self-pay | Admitting: Hematology

## 2015-04-03 NOTE — Telephone Encounter (Signed)
pt cld to CX appt for 11/16-stated she did not want to r/s @ this time

## 2015-04-04 ENCOUNTER — Other Ambulatory Visit: Payer: Self-pay

## 2015-04-04 ENCOUNTER — Ambulatory Visit: Payer: Self-pay

## 2015-04-06 ENCOUNTER — Other Ambulatory Visit: Payer: Self-pay | Admitting: Internal Medicine

## 2015-04-11 ENCOUNTER — Ambulatory Visit: Payer: Medicare Other | Admitting: Internal Medicine

## 2015-04-29 ENCOUNTER — Other Ambulatory Visit: Payer: Self-pay | Admitting: Internal Medicine

## 2015-05-02 ENCOUNTER — Other Ambulatory Visit (HOSPITAL_BASED_OUTPATIENT_CLINIC_OR_DEPARTMENT_OTHER): Payer: Medicare Other

## 2015-05-02 ENCOUNTER — Ambulatory Visit (HOSPITAL_BASED_OUTPATIENT_CLINIC_OR_DEPARTMENT_OTHER): Payer: Medicare Other

## 2015-05-02 VITALS — BP 158/70 | HR 101 | Temp 98.4°F

## 2015-05-02 DIAGNOSIS — D472 Monoclonal gammopathy: Secondary | ICD-10-CM

## 2015-05-02 DIAGNOSIS — D631 Anemia in chronic kidney disease: Secondary | ICD-10-CM | POA: Diagnosis not present

## 2015-05-02 DIAGNOSIS — N183 Chronic kidney disease, stage 3 (moderate): Secondary | ICD-10-CM | POA: Diagnosis not present

## 2015-05-02 LAB — COMPREHENSIVE METABOLIC PANEL
ALBUMIN: 3.7 g/dL (ref 3.5–5.0)
ALT: 10 U/L (ref 0–55)
AST: 14 U/L (ref 5–34)
Alkaline Phosphatase: 81 U/L (ref 40–150)
Anion Gap: 11 mEq/L (ref 3–11)
BUN: 63.1 mg/dL — ABNORMAL HIGH (ref 7.0–26.0)
CALCIUM: 8.9 mg/dL (ref 8.4–10.4)
CHLORIDE: 112 meq/L — AB (ref 98–109)
CO2: 18 mEq/L — ABNORMAL LOW (ref 22–29)
CREATININE: 3 mg/dL — AB (ref 0.6–1.1)
EGFR: 17 mL/min/{1.73_m2} — ABNORMAL LOW (ref >90)
GLUCOSE: 81 mg/dL (ref 70–140)
POTASSIUM: 4.7 meq/L (ref 3.5–5.1)
SODIUM: 141 meq/L (ref 136–145)
Total Bilirubin: 0.49 mg/dL (ref 0.20–1.20)
Total Protein: 7.9 g/dL (ref 6.4–8.3)

## 2015-05-02 LAB — CBC & DIFF AND RETIC
BASO%: 0.3 % (ref 0.0–2.0)
BASOS ABS: 0 10*3/uL (ref 0.0–0.1)
EOS ABS: 0.4 10*3/uL (ref 0.0–0.5)
EOS%: 6.6 % (ref 0.0–7.0)
HEMATOCRIT: 30.1 % — AB (ref 34.8–46.6)
HEMOGLOBIN: 9.3 g/dL — AB (ref 11.6–15.9)
Immature Retic Fract: 5.9 % (ref 1.60–10.00)
LYMPH#: 1.3 10*3/uL (ref 0.9–3.3)
LYMPH%: 21.7 % (ref 14.0–49.7)
MCH: 25.9 pg (ref 25.1–34.0)
MCHC: 30.9 g/dL — AB (ref 31.5–36.0)
MCV: 83.8 fL (ref 79.5–101.0)
MONO#: 0.5 10*3/uL (ref 0.1–0.9)
MONO%: 7.9 % (ref 0.0–14.0)
NEUT%: 63.5 % (ref 38.4–76.8)
NEUTROS ABS: 3.8 10*3/uL (ref 1.5–6.5)
Platelets: 127 10*3/uL — ABNORMAL LOW (ref 145–400)
RBC: 3.59 10*6/uL — ABNORMAL LOW (ref 3.70–5.45)
RDW: 15.8 % — AB (ref 11.2–14.5)
RETIC CT ABS: 30.87 10*3/uL — AB (ref 33.70–90.70)
Retic %: 0.86 % (ref 0.70–2.10)
WBC: 6 10*3/uL (ref 3.9–10.3)

## 2015-05-02 MED ORDER — DARBEPOETIN ALFA 200 MCG/0.4ML IJ SOSY
200.0000 ug | PREFILLED_SYRINGE | Freq: Once | INTRAMUSCULAR | Status: AC
  Administered 2015-05-02: 200 ug via SUBCUTANEOUS
  Filled 2015-05-02: qty 0.4

## 2015-05-04 ENCOUNTER — Other Ambulatory Visit: Payer: Self-pay | Admitting: Dietician

## 2015-05-04 DIAGNOSIS — E1140 Type 2 diabetes mellitus with diabetic neuropathy, unspecified: Secondary | ICD-10-CM

## 2015-05-04 LAB — SPEP & IFE WITH QIG
Abnormal Protein Band1: 0.3 g/dL
Albumin ELP: 3.7 g/dL — ABNORMAL LOW (ref 3.8–4.8)
Alpha-1-Globulin: 0.3 g/dL (ref 0.2–0.3)
Alpha-2-Globulin: 0.7 g/dL (ref 0.5–0.9)
Beta 2: 0.4 g/dL (ref 0.2–0.5)
Beta Globulin: 0.7 g/dL — ABNORMAL HIGH (ref 0.4–0.6)
Gamma Globulin: 1.2 g/dL (ref 0.8–1.7)
IGA: 637 mg/dL — AB (ref 69–380)
IGG (IMMUNOGLOBIN G), SERUM: 1340 mg/dL (ref 690–1700)
IgM, Serum: 52 mg/dL (ref 52–322)
Total Protein, Serum Electrophoresis: 6.9 g/dL (ref 6.1–8.1)

## 2015-05-04 NOTE — Telephone Encounter (Signed)
[name], [name] case worker calls and requests a meter to check her blood sugar and supplies, her family members will pick up meter and supplies For questions call [name] at [phone]

## 2015-05-07 MED ORDER — ONETOUCH VERIO FLEX SYSTEM W/DEVICE KIT: 1.0000 | PACK | Freq: Every morning | Status: DC

## 2015-05-07 MED ORDER — GLUCOSE BLOOD VI STRP: ORAL_STRIP | Status: DC

## 2015-05-07 MED ORDER — ONETOUCH DELICA LANCETS FINE MISC: Status: DC

## 2015-05-17 ENCOUNTER — Other Ambulatory Visit: Payer: Self-pay | Admitting: Pharmacist

## 2015-05-17 ENCOUNTER — Telehealth: Payer: Self-pay | Admitting: Student-PharmD

## 2015-05-17 DIAGNOSIS — I482 Chronic atrial fibrillation, unspecified: Secondary | ICD-10-CM

## 2015-05-17 MED ORDER — APIXABAN 5 MG PO TABS: 5.0000 mg | ORAL_TABLET | Freq: Two times a day (BID) | ORAL | Status: DC

## 2015-05-17 NOTE — Telephone Encounter (Signed)
[name] is a 80 y.o. female who was contacted via telephone for monitoring of apixaban (Eliquis) therapy.    ASSESSMENT Indication(s): atrial fibrillation Duration: indefinite  Labs:    Component Value Date/Time   AST 14 05/02/2015 1125   AST 15 12/13/2014 0244   ALT 10 05/02/2015 1125   ALT 11* 12/13/2014 0244   NA 141 05/02/2015 1125   NA 142 01/31/2015 1117   NA 139 12/17/2014 0551   K 4.7 05/02/2015 1125   K 5.4* 01/31/2015 1117   CL 103 01/31/2015 1117   CL 111* 04/06/2012 1031   CO2 18* 05/02/2015 1125   CO2 19 01/31/2015 1117   GLUCOSE 81 05/02/2015 1125   GLUCOSE 105* 01/31/2015 1117   GLUCOSE 126* 12/17/2014 0551   GLUCOSE 94 04/06/2012 1031   HGBA1C 6.8 11/22/2014 1204   HGBA1C * 05/29/2010 1607    7.1 (NOTE)                                                                       According to the ADA Clinical Practice Recommendations for 2011, when HbA1c is used as a screening test:   >=6.5%   Diagnostic of Diabetes Mellitus           (if abnormal result  is confirmed)  5.7-6.4%   Increased risk of developing Diabetes Mellitus  References:Diagnosis and Classification of Diabetes Mellitus,Diabetes [id number] 1):S62-S69 and Standards of Medical Care in         Diabetes - 2011,Diabetes Care,2011,34  (Suppl 1):S11-S61.   BUN 63.1* 05/02/2015 1125   BUN 44* 01/31/2015 1117   BUN 36* 12/17/2014 0551   CREATININE 3.0* 05/02/2015 1125   CREATININE 2.66* 01/31/2015 1117   CREATININE 2.24* 10/25/2012 1158   CALCIUM 8.9 05/02/2015 1125   CALCIUM 8.2* 01/31/2015 1117   GFRNONAA 17* 01/31/2015 1117   GFRNONAA 21* 10/25/2012 1158   GFRAA 19* 01/31/2015 1117   GFRAA 24* 10/25/2012 1158   WBC 6.0 05/02/2015 1125   WBC 6.9 01/31/2015 1117   WBC 8.8 12/17/2014 0551   HGB 9.3* 05/02/2015 1125   HGB 9.8* 12/17/2014 0551   HCT 30.1* 05/02/2015 1125   HCT 28.3* 01/31/2015 1117   HCT 30.5* 12/17/2014 0551   PLT 127* 05/02/2015 1125   PLT 152 12/17/2014 0551     apixaban (Eliquis) Dose: 5mg  BID  Adherence: Patient has adherence challenges. Has not picked up medication since 03/17/15. Cost at the time was $0.  Safety: Patient reports no recent signs or symptoms of bleeding, no signs of symptoms of thromboembolism. Medication changes: no.  RECOMMENDATIONS  Medication: no changes recommended at this time  PLAN Patient out of refills per pharmacy.  Request sent to PCP to send new prescription to pharmacy.  Patient missed PCP visit in Nov, sent front office staff message to schedule PCP visit.   Darvin Neighbours, [facility] PharmD Candidate 05/17/2015, 4:35 PM

## 2015-05-22 ENCOUNTER — Telehealth: Payer: Self-pay | Admitting: Internal Medicine

## 2015-05-22 DIAGNOSIS — L84 Corns and callosities: Secondary | ICD-10-CM

## 2015-05-22 DIAGNOSIS — E1140 Type 2 diabetes mellitus with diabetic neuropathy, unspecified: Secondary | ICD-10-CM

## 2015-05-22 NOTE — Telephone Encounter (Signed)
Dr Daryll Drown is out until the 4th. Pls give VO for [facility] wound care. Dr Daryll Drown can decide on need for appt when she returns

## 2015-05-22 NOTE — Telephone Encounter (Signed)
No open orders or referrals to [facility], (it was her insurance company calling)  will you place one for her and I will get with Edwena Blow to coordinate  Thank you

## 2015-05-22 NOTE — Telephone Encounter (Signed)
Dr. Daryll Drown, do you need to see the patient before ordering [facility] referral for wound care?  Looks like she is hard to get into clinic and per OV note in September wound may not be new.

## 2015-05-22 NOTE — Telephone Encounter (Signed)
Since she is not a pt of HH, I would prefer we wait until Dr Daryll Drown returns in AM as I will not be able to declare that she is home bound.

## 2015-05-22 NOTE — Telephone Encounter (Signed)
Rec'd call from [facility] Nurse Delphia Grates.  Diabetic patient has developed a right Heel Ulcer.  Nurse requesting a Home Health nurse to help with Hygiene and Wound Care of the patient.

## 2015-05-24 ENCOUNTER — Telehealth: Payer: Self-pay | Admitting: Licensed Clinical Social Worker

## 2015-05-24 NOTE — Telephone Encounter (Signed)
Referral to [facility] is what will start the ball rolling.

## 2015-05-24 NOTE — Telephone Encounter (Signed)
Reviewed chart.  She would definitely benefit from [facility] for wound care and she is mostly homebound, cannot leave her house without maximal assistance from a family member.  What do I need to do going forward?

## 2015-05-24 NOTE — Telephone Encounter (Signed)
[name] was referred to CSW for home health services.  Pt has not been seen by [facility] physician since 01/2015.  Pt does not have a current face to face.  CSW placed call to [name] to inquire if pt has had HH in the past and had an agency of preference.  [name] states she current has an aid, this worker informed [name] of current order for [facility] RN for wound care.  Pt confirmed she has used [place] in the past.  CSW informed [name] this worker will Development worker, international aid and confirm if they are able to accept based on upcoming 06/05/14 appointment.  CSW discussed the need to have patient attend scheduled appointment for insurance coverage of [facility] services.  Pt voiced understanding. CSW placed call to [facility], [place] Go able to accept referrals based on upcoming appointment.  However, if pt does not make upcoming appointment services would be discontinued.  Referral faxed to [facility].  [name] notified.

## 2015-05-24 NOTE — Telephone Encounter (Signed)
Order placed. Thanks.

## 2015-05-25 NOTE — Telephone Encounter (Signed)
Home Health referral attempted.  Pt will need an office visit.  As pt's most recent [facility] office visit for face to face is 01/2015.  Confirmed with [facility], patients prior [facility] agency.

## 2015-05-25 NOTE — Telephone Encounter (Signed)
Shana - -  Does she need a visit?   [name] - - Can you help set this up if she needs a new face-to-face visit?  Thanks  EBM

## 2015-05-28 ENCOUNTER — Other Ambulatory Visit: Payer: Self-pay | Admitting: Internal Medicine

## 2015-05-28 NOTE — Telephone Encounter (Signed)
Yes, patient needs a visit.  Thank you

## 2015-05-29 ENCOUNTER — Other Ambulatory Visit: Payer: Self-pay | Admitting: Hematology

## 2015-05-29 ENCOUNTER — Telehealth: Payer: Self-pay | Admitting: *Deleted

## 2015-05-29 ENCOUNTER — Telehealth: Payer: Self-pay | Admitting: Hematology

## 2015-05-29 DIAGNOSIS — N184 Chronic kidney disease, stage 4 (severe): Principal | ICD-10-CM

## 2015-05-29 DIAGNOSIS — D631 Anemia in chronic kidney disease: Secondary | ICD-10-CM

## 2015-05-29 NOTE — Telephone Encounter (Signed)
Patient called in to reschedule her appointments due to weather

## 2015-05-29 NOTE — Telephone Encounter (Signed)
TC from pt's case manager to give [place] phone of transportation agency assisting patient.  The company is : "[employer]"  [phone]  Pt will need to call them 1/2 hour prior to scheduled pick up time.  Informed CM that pt resceduled appt d/t weather and her next appt is 06/07/15. Times given to CM.  CM to contact patient regarding transportation.

## 2015-05-30 ENCOUNTER — Ambulatory Visit: Payer: Self-pay | Admitting: Hematology

## 2015-05-30 ENCOUNTER — Ambulatory Visit: Payer: Self-pay

## 2015-05-30 ENCOUNTER — Other Ambulatory Visit: Payer: Self-pay

## 2015-05-31 ENCOUNTER — Other Ambulatory Visit: Payer: Self-pay | Admitting: Nurse Practitioner

## 2015-05-31 NOTE — Telephone Encounter (Signed)
Appointment scheduled 1/18

## 2015-06-02 ENCOUNTER — Other Ambulatory Visit: Payer: Self-pay | Admitting: Internal Medicine

## 2015-06-06 ENCOUNTER — Encounter: Payer: Self-pay | Admitting: Internal Medicine

## 2015-06-07 ENCOUNTER — Telehealth: Payer: Self-pay | Admitting: Hematology

## 2015-06-07 ENCOUNTER — Ambulatory Visit (HOSPITAL_BASED_OUTPATIENT_CLINIC_OR_DEPARTMENT_OTHER): Payer: Medicare Other | Admitting: Hematology

## 2015-06-07 ENCOUNTER — Other Ambulatory Visit (HOSPITAL_BASED_OUTPATIENT_CLINIC_OR_DEPARTMENT_OTHER): Payer: Medicare Other

## 2015-06-07 ENCOUNTER — Ambulatory Visit (HOSPITAL_BASED_OUTPATIENT_CLINIC_OR_DEPARTMENT_OTHER): Payer: Medicare Other

## 2015-06-07 ENCOUNTER — Encounter: Payer: Self-pay | Admitting: Hematology

## 2015-06-07 VITALS — BP 136/87 | HR 90 | Temp 97.9°F | Resp 18 | Ht 67.0 in | Wt 180.5 lb

## 2015-06-07 DIAGNOSIS — D631 Anemia in chronic kidney disease: Secondary | ICD-10-CM | POA: Diagnosis not present

## 2015-06-07 DIAGNOSIS — E538 Deficiency of other specified B group vitamins: Secondary | ICD-10-CM

## 2015-06-07 DIAGNOSIS — N184 Chronic kidney disease, stage 4 (severe): Secondary | ICD-10-CM

## 2015-06-07 DIAGNOSIS — N183 Chronic kidney disease, stage 3 (moderate): Secondary | ICD-10-CM

## 2015-06-07 DIAGNOSIS — D472 Monoclonal gammopathy: Secondary | ICD-10-CM

## 2015-06-07 LAB — CBC WITH DIFFERENTIAL/PLATELET
BASO%: 0.2 % (ref 0.0–2.0)
Basophils Absolute: 0 10*3/uL (ref 0.0–0.1)
EOS%: 6 % (ref 0.0–7.0)
Eosinophils Absolute: 0.5 10*3/uL (ref 0.0–0.5)
HEMATOCRIT: 30 % — AB (ref 34.8–46.6)
HEMOGLOBIN: 9.4 g/dL — AB (ref 11.6–15.9)
LYMPH#: 1.5 10*3/uL (ref 0.9–3.3)
LYMPH%: 17.8 % (ref 14.0–49.7)
MCH: 26.2 pg (ref 25.1–34.0)
MCHC: 31.3 g/dL — AB (ref 31.5–36.0)
MCV: 83.6 fL (ref 79.5–101.0)
MONO#: 0.8 10*3/uL (ref 0.1–0.9)
MONO%: 9.9 % (ref 0.0–14.0)
NEUT#: 5.5 10*3/uL (ref 1.5–6.5)
NEUT%: 66.1 % (ref 38.4–76.8)
PLATELETS: 138 10*3/uL — AB (ref 145–400)
RBC: 3.59 10*6/uL — ABNORMAL LOW (ref 3.70–5.45)
RDW: 16.4 % — AB (ref 11.2–14.5)
WBC: 8.4 10*3/uL (ref 3.9–10.3)

## 2015-06-07 MED ORDER — DARBEPOETIN ALFA 200 MCG/0.4ML IJ SOSY
200.0000 ug | PREFILLED_SYRINGE | Freq: Once | INTRAMUSCULAR | Status: AC
  Administered 2015-06-07: 200 ug via SUBCUTANEOUS
  Filled 2015-06-07: qty 0.4

## 2015-06-07 NOTE — Progress Notes (Signed)
[name] OFFICE PROGRESS NOTE  [name], MD [place] [address]  DIAGNOSIS: Anemia, unspecified anemia type  CHIEF COMPLAIN: Follow up MGUS and anemia.  CURRENT THERAPY: Vitamin B12 shots every 8 weeks and aranesp 200 mcg every 4 weeks for a hemoglobin less than or equal to 11, started on 01/09/2013. Held after 07/2014 due to her stroke,  and restarted on 03/07/2015   PROBLEM LIST:  1. Chronic anemia.  2. Renal insufficiency.  3. Atrial fibrillation.  4. 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  5. Low vitamin B12 level.  6. Osteomyelitis of the left foot status post left mid foot amputation  on 05/30/2010.  7. Status post amputation of toes of right foot in December 2012.  8. Hypertension.  9. Diabetes mellitus.  10.Venous insufficiency.  11.Dyslipidemia.  12.Status post total thyroidectomy for multinodular goiter on  03/13/2006.  13.History of asthma.  14.Thrombocytopenia with variable platelet count first noted 08/2010,  suspect chronic idiopathic thrombocytopenic purpura.  15. 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  INTERVAL HISTORY: [name] 81 y.o. female with a history of chronic kidney disease complicated by anemia and Vit[place] B12 deficiency is here for follow-up.    Her son brought her in to the clinic today, but she came to see me by herself. She was drowsy and dozing off during the visit. She states she is about same, she still has wound in the left foot and heel, she is seeing her podiatrist for that. The left lower extremity ulcer has healed well. She denies any other significant pain, she has moderate fatigue which is stable, able to take at home, she has a home aid to help her to do house chore. She missed her Aranesp injection in November 2016, and she does not remember what happened.   MEDICAL HISTORY: Past Medical History  Diagnosis  Date  . Hypertension   . Hyperlipidemia   . Chronic venous insufficiency   . Osteomyelitis ([facility])     s/p Rt 2nd toe and left 5 toes  amputation in 1/12  by Dr. Sharol Given  . Diabetes mellitus   . Anemia     b12 def, iron def, follow at cancer center, gets B12 and  another injection there. Could not remeber the name. Dr. Ralene Ok is her  cancer doctor  . Thyroid disease     hypothyroidism h/o hyperthyroidism s/p ablation/ectomy  . Asthma   . Blood transfusion     two or more yrs ago  . Hypothyroidism     thyroid removed 4 or more yrs ago  . Atrial fibrillation ([place])   . Chronic renal insufficiency   . MGUS (monoclonal gammopathy of unknown significance)      ALLERGIES:  has No Known Allergies.  MEDICATIONS: has a current medication list which includes the following prescription(s): apixaban, atorvastatin, onetouch verio flex system, calcitriol, cephalexin, vitamin d3, furosemide, glipizide, glucose blood, januvia, levothyroxine, lisinopril, loratadine, metoprolol tartrate, onetouch delica lancets fine, pantoprazole, polyethylene glycol, sennosides-docusate sodium, and hydrocodone-acetaminophen.  SURGICAL HISTORY:  Past Surgical History  Procedure Laterality Date  . Eye surgery      cat ext ou  . Appendectomy      teenager  . Amputation  04/30/2011    Procedure: AMPUTATION FOOT;  Surgeon: Newt Minion, MD;  Location: [facility];  Service: Orthopedics;  Laterality: Right;  Right Midfoot Amputation  . Esophagogastroduodenoscopy N/A  12/15/2014    Procedure: ESOPHAGOGASTRODUODENOSCOPY (EGD);  Surgeon: Ladene Artist, MD;  Location: [facility] ENDOSCOPY;  Service: Endoscopy;  Laterality: N/A;  . Colonoscopy N/A 12/17/2014    Procedure: COLONOSCOPY;  Surgeon: Ladene Artist, MD;  Location: [facility] ENDOSCOPY;  Service: Endoscopy;  Laterality: N/A;    REVIEW OF SYSTEMS:   Constitutional: Denies fevers, chills or abnormal weight loss Eyes: Denies blurriness of vision Ears, nose, mouth, throat, and face:  Denies mucositis or sore throat Respiratory: Denies cough, dyspnea or wheezes Cardiovascular: Denies palpitation, chest discomfort or lower extremity swelling Gastrointestinal:  Denies nausea, heartburn or change in bowel habits Skin: Denies abnormal skin rashes Lymphatics: Denies new lymphadenopathy or easy bruising Neurological:Denies numbness, tingling or new weaknesses Behavioral/Psych: Mood is stable, no new changes  All other systems were reviewed with the patient and are negative.  PHYSICAL EXAMINATION: ECOG PERFORMANCE STATUS: 3  Blood pressure 136/87, pulse 90, temperature 97.9 F (36.6 C), temperature source Oral, resp. rate 18, height _0  (1.702 m), weight 180 lb 8 oz (81.874 kg), SpO2 100 %.  GENERAL:alert, no distress and comfortable; chronically ill elderly female who uses a walker.  SKIN: skin color, texture, turgor are normal, no rashes or significant lesions, except a 2-3cm large shallow skin ulcer in left LE below knee, with surrounding skin erythema, no significant discharge  EYES: normal, Conjunctiva are pink and non-injected, sclera clear OROPHARYNX:no exudate, no erythema and lips, buccal mucosa, and tongue normal  NECK: supple, thyroid normal size, non-tender, without nodularity LYMPH:  no palpable lymphadenopathy in the cervical, axillary or supraclavicular LUNGS: clear to auscultation and percussion with normal breathing effort HEART: irregular rate & rhythm and no murmurs and 1+ lower extremity edema ABDOMEN:abdomen soft, non-tender and normal bowel sounds Musculoskeletal:no cyanosis of digits and no clubbing; s/p bilateral toes amputations as noted above.  NEURO: alert & oriented x 3 with fluent speech, ambulates via a wheelchair, she has mild left arm weakness, other cranial nerve exam were unremarkable, no focal motor or sensation deficiency.  LABORATORY DATA: CBC Latest Ref Rng 06/07/2015 05/02/2015 02/28/2015  WBC 3.9 - 10.3 10e3/uL 8.4 6.0 7.6   Hemoglobin 11.6 - 15.9 g/dL 9.4(L) 9.3(L) 9.0(L)  Hematocrit 34.8 - 46.6 % 30.0(L) 30.1(L) 28.5(L)  Platelets 145 - 400 10e3/uL 138(L) 127(L) 146    CMP Latest Ref Rng 05/02/2015 02/28/2015 01/31/2015  Glucose 70 - 140 mg/dl 81 96 105(H)  BUN 7.0 - 26.0 mg/dL 63.1(H) 44.2(H) 44(H)  Creatinine 0.6 - 1.1 mg/dL 3.0(HH) 2.8(H) 2.66(H)  Sodium 136 - 145 mEq/L 141 142 142  Potassium 3.5 - 5.1 mEq/L 4.7 4.6 5.4(H)  Chloride 97 - 108 mmol/L - - 103  CO2 22 - 29 mEq/L 18(L) 21(L) 19  Calcium 8.4 - 10.4 mg/dL 8.9 8.9 8.2(L)  Total Protein 6.4 - 8.3 g/dL 7.9 7.6 -  Total Bilirubin 0.20 - 1.20 mg/dL 0.49 0.67 -  Alkaline Phos 40 - 150 U/L 81 103 -  AST 5 - 34 U/L 14 14 -  ALT 0 - 55 U/L 10 <9 -   M-protein  10/11/2014: 0.3 02/28/2015: 0.2 05/02/2015: 0.3  IgA (69-380 mg/dl) 10/11/2014: 622 02/28/2015: 566 05/02/2015: 637  RADIOGRAPHIC STUDIES: 1. Digital screening mammogram on 07/26/2010 was negative.  2. MRI of the right forefoot without IV contrast on 08/20/2010 showed  apparent new soft tissue ulceration distal to the third metatarsal status post prior resection of the third toe. No soft tissue abscess was demonstrated. There was interval resolution of the previously demonstrated  marrow edema within the third metatarsal. No evidence for osteomyelitis. There was stable mid foot degenerative changes and chronic myopathic changes.  3. CT of the cervical spine without IV contrast on 10/21/2010 showed  degenerative changes in the cervical spine. No displaced fractures  were identified.  4. CT of the head without IV contrast on 10/21/2010 showed no evidence  for acute intracranial hemorrhage, mass lesion or acute infarct. There was some mild diffuse atrophy. There was a subcutaneous hematoma over the left anterior frontal/periorbital region. No underlying skull fracture or acute intracranial abnormality.  5. Chest x-ray, 2 view, from 04/28/2011 showed stable moderate  cardiomegaly with no  active lung disease. There was probable mild  scarring at the lung bases.   ASSESSMENT: [name] 81 y.o. female with a history of multiple comorbidities, including anemia, IgA MGUS, renal failure, B12 deficiency, etc.  1. Anemia and MGUS, rule out MM  --her anemia has been getting slightly worse over the past year, despite Arenasp injection every 1-2 months. -Given her history of IgA MGUS, renal failure, and worsening anemia, multiple myeloma needs to be ruled out. Her previous M protein level was low, IgA and light chain levels are elevated. -Her Skeletal survey was normal.  -I have discussed bone marrow biopsy several times, and she repeatedly declined due to the concern of complications from procedure such as pain. I'll hold on for now. -her M-protein has been remain to be low and stable overall, will continue monitoring  -Continue Aranesp 200 g injection monthly, she missed a dose in November last year, she still has moderate anemia,  I encouraged her to keep her monthly injection appointments -I'll check her iron studies and B12 level next months, to see if she still needs B12 injection  2. Left feet wound -she will continue follow up with her podiatrist   3. Right front lobe CVA in 09/2014 -likely related to her AF without anticoagulation .  -She is recovering slowly.  -She is on a eliquis now   4. Hypertension, diabetes, A. Fib, CKD, and other medical problems -She will continue follow-up with her primary care physician. She is on Eliquis now for A. Fib  All questions were answered. The patient knows to call the clinic with any problems, questions or concerns. We can certainly see the patient much sooner if necessary.  Plan -Continue Aranesp injection monthly  -I'll see her back in 3 months   I spent 20 minutes counseling th between-year-old I think that she ise patient face to face. The total time spent in the appointment was 25 minutes.    Truitt Merle, MD 06/07/2015

## 2015-06-07 NOTE — Telephone Encounter (Signed)
Mailed appts for Feb- April 2017.

## 2015-06-09 ENCOUNTER — Other Ambulatory Visit: Payer: Self-pay | Admitting: Internal Medicine

## 2015-06-13 ENCOUNTER — Encounter: Payer: Self-pay | Admitting: Licensed Clinical Social Worker

## 2015-06-15 ENCOUNTER — Other Ambulatory Visit: Payer: Self-pay | Admitting: Internal Medicine

## 2015-06-26 ENCOUNTER — Other Ambulatory Visit: Payer: Self-pay | Admitting: Internal Medicine

## 2015-06-27 NOTE — Telephone Encounter (Signed)
Just filled on 06/11/15 for 3 month supply

## 2015-07-04 ENCOUNTER — Telehealth: Payer: Self-pay | Admitting: Hematology

## 2015-07-04 ENCOUNTER — Other Ambulatory Visit: Payer: Self-pay | Admitting: *Deleted

## 2015-07-04 NOTE — Telephone Encounter (Signed)
Spoke with patient to confirm added lab for 2/16.

## 2015-07-05 ENCOUNTER — Ambulatory Visit (HOSPITAL_BASED_OUTPATIENT_CLINIC_OR_DEPARTMENT_OTHER): Payer: Medicare Other

## 2015-07-05 ENCOUNTER — Ambulatory Visit (INDEPENDENT_AMBULATORY_CARE_PROVIDER_SITE_OTHER): Payer: Medicare Other | Admitting: Neurology

## 2015-07-05 ENCOUNTER — Other Ambulatory Visit (HOSPITAL_BASED_OUTPATIENT_CLINIC_OR_DEPARTMENT_OTHER): Payer: Medicare Other

## 2015-07-05 ENCOUNTER — Encounter: Payer: Self-pay | Admitting: Neurology

## 2015-07-05 VITALS — BP 172/83 | HR 85 | Ht 67.0 in | Wt 180.6 lb

## 2015-07-05 VITALS — BP 156/77 | HR 115 | Temp 98.0°F

## 2015-07-05 DIAGNOSIS — I6990 Unspecified sequelae of unspecified cerebrovascular disease: Secondary | ICD-10-CM

## 2015-07-05 DIAGNOSIS — D631 Anemia in chronic kidney disease: Secondary | ICD-10-CM | POA: Diagnosis not present

## 2015-07-05 DIAGNOSIS — D472 Monoclonal gammopathy: Secondary | ICD-10-CM

## 2015-07-05 DIAGNOSIS — N183 Chronic kidney disease, stage 3 (moderate): Secondary | ICD-10-CM | POA: Diagnosis not present

## 2015-07-05 DIAGNOSIS — N184 Chronic kidney disease, stage 4 (severe): Secondary | ICD-10-CM

## 2015-07-05 LAB — CBC & DIFF AND RETIC
BASO%: 0.4 % (ref 0.0–2.0)
Basophils Absolute: 0 10*3/uL (ref 0.0–0.1)
EOS ABS: 0.6 10*3/uL — AB (ref 0.0–0.5)
EOS%: 7.9 % — ABNORMAL HIGH (ref 0.0–7.0)
HCT: 33.5 % — ABNORMAL LOW (ref 34.8–46.6)
HEMOGLOBIN: 10.4 g/dL — AB (ref 11.6–15.9)
IMMATURE RETIC FRACT: 5.4 % (ref 1.60–10.00)
LYMPH%: 18.6 % (ref 14.0–49.7)
MCH: 26.1 pg (ref 25.1–34.0)
MCHC: 31 g/dL — ABNORMAL LOW (ref 31.5–36.0)
MCV: 84 fL (ref 79.5–101.0)
MONO#: 0.7 10*3/uL (ref 0.1–0.9)
MONO%: 9.8 % (ref 0.0–14.0)
NEUT%: 63.3 % (ref 38.4–76.8)
NEUTROS ABS: 4.6 10*3/uL (ref 1.5–6.5)
Platelets: 155 10*3/uL (ref 145–400)
RBC: 3.99 10*6/uL (ref 3.70–5.45)
RDW: 16.5 % — AB (ref 11.2–14.5)
RETIC %: 0.73 % (ref 0.70–2.10)
Retic Ct Abs: 29.13 10*3/uL — ABNORMAL LOW (ref 33.70–90.70)
WBC: 7.3 10*3/uL (ref 3.9–10.3)
lymph#: 1.4 10*3/uL (ref 0.9–3.3)

## 2015-07-05 LAB — IRON AND TIBC
%SAT: 15 % — AB (ref 21–57)
Iron: 43 ug/dL (ref 41–142)
TIBC: 289 ug/dL (ref 236–444)
UIBC: 240 ug/dL (ref 120–384)

## 2015-07-05 LAB — COMPREHENSIVE METABOLIC PANEL
ALBUMIN: 3.8 g/dL (ref 3.5–5.0)
ALT: 10 U/L (ref 0–55)
ANION GAP: 13 meq/L — AB (ref 3–11)
AST: 15 U/L (ref 5–34)
Alkaline Phosphatase: 86 U/L (ref 40–150)
BILIRUBIN TOTAL: 0.65 mg/dL (ref 0.20–1.20)
BUN: 60.8 mg/dL — ABNORMAL HIGH (ref 7.0–26.0)
CO2: 21 meq/L — AB (ref 22–29)
CREATININE: 3.2 mg/dL — AB (ref 0.6–1.1)
Calcium: 9.4 mg/dL (ref 8.4–10.4)
Chloride: 107 mEq/L (ref 98–109)
EGFR: 15 mL/min/{1.73_m2} — ABNORMAL LOW (ref >90)
Glucose: 140 mg/dl (ref 70–140)
Potassium: 4.9 mEq/L (ref 3.5–5.1)
Sodium: 141 mEq/L (ref 136–145)
TOTAL PROTEIN: 8.3 g/dL (ref 6.4–8.3)

## 2015-07-05 LAB — FERRITIN: FERRITIN: 42 ng/mL (ref 9–269)

## 2015-07-05 MED ORDER — DARBEPOETIN ALFA 200 MCG/0.4ML IJ SOSY
200.0000 ug | PREFILLED_SYRINGE | Freq: Once | INTRAMUSCULAR | Status: AC
  Administered 2015-07-05: 200 ug via SUBCUTANEOUS
  Filled 2015-07-05: qty 0.4

## 2015-07-05 NOTE — Progress Notes (Signed)
[name] [address] [place] [address] [phone]       OFFICE FOLLOW-UP NOTE  [name] Date of Birth:  1934-10-05 Medical Record Number:  [id number]   HPI:   [name] is a 81 y.o. female with a history of hypertension, hyperlipidemia, chronic renal insufficiency, diabetes mellitus, atrial fibrillation on anticoagulation in the past which was stopped by the patient secondary to bruising. Since that time she has been on aspirin 325 mg daily. On 09/29/14 evening the patient was speaking on the telephone sometime between 8 and 9 PM when she noted she was having difficulty with her speech. Her words seemed slurred. When she got off of the phone she attempted to ambulate with her walker and noted left sided weakness in the upper extremity more so than the lower extremity. There was no one with her at that time and she did not seek medical attention. She was brought to the emergency department today by her son and daughter for further evaluation. They noted that the patient had been choking while trying to eat. She has residual deficits although the family feels that they are somewhat improved.  Date last known well: Date: 09/29/2014 Time last known well: Unable to determine tPA Given: No: The patient was out of the window for therapeutic treatment. On admission, [name] showed some questionable facial droop and expressive aphasia. Head CT showed no acute intracranial findings but was significant for chronic ischemic microvascular disease with a few small old bilateral central lacunar infarcts. Neurology was consulted. She underwent full stroke work up. Likely cause of her infarct was embolic as she has atrial fibrillation not on anti-coagulation despite CHADSVasc score of 7. Her lisinopril was held to allow for permissive hypertension in the setting of acute stroke. MRI was significant for patchy areas of acute ischemia in the R frontal lobe (MCA territory)  and remote bilateral thalamus and R basal ganglia lacunar infarct. MRA showed no large vessel occlusion or definite high-grade stenosis. Lipid panel and UDS unremarkable. She was switched from her home statin to high intensity statin, atorvastatin 93m daily. Patient's last HbA1c was 6.6 (07/2014). Echo with LV EF 35-40%, hypokinesis of the mid-apicalanteroseptal, anterior, inferior,and apical myocardium. Prelim carotid dopplers 1-39% bilateral. SLP recommend DYS 2 diet and PT/OT recommended SNF. Patient was started on eliquis 525mBID on 05/16. Patient was discharged with Aspirin 8127maily and eliquis 5mg54mD. Patient will also restart home medication of Lisinopril, glipizide, and Januvia, and continue Lopressor on discharge. Since discharge patient has been readmitted last month with lower GI bleeding which was felt to be related to diverticulitis on colonoscopy. She required blood transfusion Her eliquis was stopped and was restarted only last Monday. She states that her speech is improved but she still has some mild weakness in the left leg. She has also noticed increased swelling in the legs. She is able to walk with a wheeled walker and is careful and has not had any falls. She is having minor bruising on eliquis and baby aspirin. She states her blood sugars all well controlled and fasting glucose is have ranged from 9210 range. Her blood pressure has been well controlled. Update 07/05/2015 :  She returns for follow-up after last visit 6 months ago. She is accompanied by her daughter. She continues to well from neurovascular standpoint without recurrent stroke or TIA symptoms. She states her blood pressure is usually well controlled at home but this morning she did not take  her medication and hence it is slightly elevated in office at 172/83. She remains on eliquis and has not had any bleeding episodes but does get minor bruising. She admits she has not been 100% compliant with eliquis and occasionally  misses a dose. She states her fasting sugars are all well controlled. She uses a wheeled walker and is careful and she gets up and walks and has had no major falls. She lives alone but does have a lady come in to help couple of hours a day. Her daughter lives nearby and provides close supervision. ROS:   14 system review of systems is positive for  weakness, gait difficulty, imbalance,  easy bruising, bleeding and all other systems negative  PMH:  Past Medical History  Diagnosis Date  . Hypertension   . Hyperlipidemia   . Chronic venous insufficiency   . Osteomyelitis ([place])     s/p Rt 2nd toe and left 5 toes  amputation in 1/12  by Dr. Sharol Given  . Diabetes mellitus   . Anemia     b12 def, iron def, follow at cancer center, gets B12 and  another injection there. Could not remeber the name. Dr. Ralene Ok is her  cancer doctor  . Thyroid disease     hypothyroidism h/o hyperthyroidism s/p ablation/ectomy  . Asthma   . Blood transfusion     two or more yrs ago  . Hypothyroidism     thyroid removed 4 or more yrs ago  . Atrial fibrillation ([place])   . Chronic renal insufficiency   . MGUS (monoclonal gammopathy of unknown significance)   . Stroke [facility])     Social History:  Social History   Social History  . Marital Status: Widowed    Spouse Name: N/A  . Number of Children: N/A  . Years of Education: N/A   Occupational History  . Not on file.   Social History Main Topics  . Smoking status: Never Smoker   . Smokeless tobacco: Never Used  . Alcohol Use: No  . Drug Use: No  . Sexual Activity: Not on file   Other Topics Concern  . Not on file   Social History Narrative    Medications:   Current Outpatient Prescriptions on File Prior to Visit  Medication Sig Dispense Refill  . atorvastatin (LIPITOR) 40 MG tablet Take 1 tablet (40 mg total) by mouth daily at 6 PM. 30 tablet 3  . Blood Glucose Monitoring Suppl (ONETOUCH VERIO FLEX SYSTEM) W/DEVICE KIT 1 each by Does not apply route  every morning. Check blood sugar one time each day 1 kit 1  . calcitRIOL (ROCALTROL) 0.25 MCG capsule Take 1 capsule by mouth every Monday, Wednesday, and Friday.  5  . Cholecalciferol (VITAMIN D3) 10000 UNITS capsule Take 1 capsule (10,000 Units total) by mouth once a week. 4 capsule 3  . ELIQUIS 5 MG TABS tablet TAKE 1 TABLET(5 MG) BY MOUTH TWICE DAILY 60 tablet 2  . furosemide (LASIX) 20 MG tablet TAKE 1 TABLET(20 MG) BY MOUTH DAILY 30 tablet 3  . glipiZIDE (GLUCOTROL) 10 MG tablet Take 0.5 tablets (5 mg total) by mouth 2 (two) times daily before a meal. 180 tablet 3  . glucose blood (ONETOUCH VERIO) test strip Check blood sugar one time each day 50 each 5  . HYDROcodone-acetaminophen (NORCO/VICODIN) 5-325 MG tablet TK 1 T PO  BID TO TID PRN P  0  . JANUVIA 25 MG tablet TAKE 1 TABLET BY MOUTH EVERY DAY 90 tablet  0  . levothyroxine (SYNTHROID, LEVOTHROID) 112 MCG tablet TAKE 1 TABLET BY MOUTH EVERY DAY BEFORE BREAKFAST 30 tablet 6  . lisinopril (PRINIVIL,ZESTRIL) 40 MG tablet TAKE 1 TABLET BY MOUTH EVERY DAY 90 tablet 0  . loratadine (CLARITIN) 10 MG tablet Take 10 mg by mouth daily. 10/10/14 Daily x 5 days then prn daily    . metoprolol tartrate (LOPRESSOR) 25 MG tablet TAKE 1 TABLET BY MOUTH TWICE DAILY 60 tablet 0  . ONETOUCH DELICA LANCETS FINE MISC Check blood sugar one time each day 100 each 5  . pantoprazole (PROTONIX) 40 MG tablet TAKE 1 TABLET BY MOUTH EVERY DAY 90 tablet 0  . polyethylene glycol (MIRALAX / GLYCOLAX) packet Take 17 g by mouth daily as needed for mild constipation or moderate constipation.     . sennosides-docusate sodium (SENOKOT-S) 8.6-50 MG tablet Take 2 tablets by mouth daily.     No current facility-administered medications on file prior to visit.    Allergies:  No Known Allergies  Physical Exam General: well developed, well nourished, elderly Caucasian lady seated, in no evident distress Head: head normocephalic and atraumatic.  Neck: supple with no carotid  or supraclavicular bruits Cardiovascular: regular rate and rhythm, no murmurs Musculoskeletal: no deformity. Mild kyphosis Skin:  no rash/petichiae Vascular:  Normal pulses all extremities Filed Vitals:   07/05/15 1017  BP: 172/83  Pulse: 85   Neurologic Exam Mental Status: Awake and fully alert. Oriented to place and time. Recent and remote memory intact. Attention span, concentration and fund of knowledge appropriate. Mood and affect appropriate.  Cranial Nerves: Fundoscopic exam not done  . Pupils equal, briskly reactive to light. Extraocular movements full without nystagmus. Visual fields full to confrontation. Hearing intact. Facial sensation intact. Mild left lower face weakness., Tongue, palate moves normally and symmetrically.  Motor: Normal bulk and tone. Normal strength in all tested extremity muscles. Diminished fine finger movements on the left. Orbits right over left upper extremity. Mild left grip weakness. Sensory.: intact to touch ,pinprick .position and vibratory sensation.  Coordination: Rapid alternating movements normal in all extremities. Finger-to-nose and heel-to-shin performed minimally impaired on the left.. Gait and Station: Arises from chair with slight difficulty. Stance is stooped. Walks with a wheeled walker. Unable to heel, toe and tandem walk without difficulty.  Reflexes: 1+ and symmetric. Toes downgoing.   NIHSS  2 Modified Rankin  2   ASSESSMENT: 3 year Caucasian lady with embolic right MCA branch infarct in May 2016 secondary to atrial fibrillation  who is doing well     PLAN: I had a long d/w patient about her remote  stroke, risk for recurrent stroke/TIAs, personally independently reviewed imaging studies and stroke evaluation results and answered questions.Continue Eliquis  for secondary stroke prevention and maintain strict control of hypertension with blood pressure goal below 130/90, diabetes with hemoglobin A1c goal below 6.5% and lipids with  LDL cholesterol goal below 70 mg/dL. I counseled her to be compliant with her eliquis and not to miss any dosages. Check screening carotid ultrasound study. Followup in the future with stroke nurse practitioner in one year or call earlier if necessary   Antony Contras, MD  Note: This document was prepared with digital dictation and possible smart phrase technology. Any transcriptional errors that result from this process are unintentional

## 2015-07-05 NOTE — Patient Instructions (Signed)
I had a long d/w patient about her remote  stroke, risk for recurrent stroke/TIAs, personally independently reviewed imaging studies and stroke evaluation results and answered questions.Continue Eliquis  for secondary stroke prevention and maintain strict control of hypertension with blood pressure goal below 130/90, diabetes with hemoglobin A1c goal below 6.5% and lipids with LDL cholesterol goal below 70 mg/dL. I counseled her to be compliant with her eliquis and not to miss any dosages. Check screening carotid ultrasound study. Followup in the future with stroke nurse practitioner in one year or call earlier if necessary

## 2015-07-06 ENCOUNTER — Other Ambulatory Visit: Payer: Self-pay | Admitting: Internal Medicine

## 2015-07-06 LAB — VITAMIN B12: Vitamin B12: 391 pg/mL (ref 211–946)

## 2015-07-09 LAB — MULTIPLE MYELOMA PANEL, SERUM
ALBUMIN SERPL ELPH-MCNC: 3.8 g/dL (ref 2.9–4.4)
ALBUMIN/GLOB SERPL: 1.1 (ref 0.7–1.7)
ALPHA 1: 0.3 g/dL (ref 0.0–0.4)
ALPHA2 GLOB SERPL ELPH-MCNC: 0.9 g/dL (ref 0.4–1.0)
B-GLOBULIN SERPL ELPH-MCNC: 1.2 g/dL (ref 0.7–1.3)
GAMMA GLOB SERPL ELPH-MCNC: 1.4 g/dL (ref 0.4–1.8)
GLOBULIN, TOTAL: 3.7 g/dL (ref 2.2–3.9)
IgA, Qn, Serum: 606 mg/dL — ABNORMAL HIGH (ref 64–422)
IgM, Qn, Serum: 71 mg/dL (ref 26–217)
Total Protein: 7.5 g/dL (ref 6.0–8.5)

## 2015-07-11 ENCOUNTER — Ambulatory Visit (INDEPENDENT_AMBULATORY_CARE_PROVIDER_SITE_OTHER): Payer: Medicare Other | Admitting: Internal Medicine

## 2015-07-11 ENCOUNTER — Encounter: Payer: Self-pay | Admitting: Internal Medicine

## 2015-07-11 VITALS — BP 132/87 | HR 88 | Temp 97.5°F | Wt 181.2 lb

## 2015-07-11 DIAGNOSIS — E1140 Type 2 diabetes mellitus with diabetic neuropathy, unspecified: Secondary | ICD-10-CM

## 2015-07-11 DIAGNOSIS — N184 Chronic kidney disease, stage 4 (severe): Secondary | ICD-10-CM

## 2015-07-11 DIAGNOSIS — E1122 Type 2 diabetes mellitus with diabetic chronic kidney disease: Secondary | ICD-10-CM | POA: Diagnosis not present

## 2015-07-11 DIAGNOSIS — Z89421 Acquired absence of other right toe(s): Secondary | ICD-10-CM

## 2015-07-11 DIAGNOSIS — Z89412 Acquired absence of left great toe: Secondary | ICD-10-CM

## 2015-07-11 DIAGNOSIS — Z7984 Long term (current) use of oral hypoglycemic drugs: Secondary | ICD-10-CM

## 2015-07-11 DIAGNOSIS — Z89422 Acquired absence of other left toe(s): Secondary | ICD-10-CM

## 2015-07-11 DIAGNOSIS — Z89411 Acquired absence of right great toe: Secondary | ICD-10-CM

## 2015-07-11 DIAGNOSIS — Z89429 Acquired absence of other toe(s), unspecified side: Secondary | ICD-10-CM

## 2015-07-11 LAB — GLUCOSE, CAPILLARY: Glucose-Capillary: 106 mg/dL — ABNORMAL HIGH (ref 65–99)

## 2015-07-11 LAB — POCT GLYCOSYLATED HEMOGLOBIN (HGB A1C): HEMOGLOBIN A1C: 7.3

## 2015-07-11 NOTE — Assessment & Plan Note (Addendum)
Given history of amputation, diabetes and neuropathy, she should qualify for DM shoes.   New Rx sent to equipment provider.   Her wounds are improved today, but she continues with calluses and dry/cracked feet making her chance of getting another wound likely.  Would like wound care to see X 1 in the outpatient setting.  I doubt she will need chronic wound care.

## 2015-07-11 NOTE — Progress Notes (Signed)
   Subjective:    Patient ID: [name], female    DOB: 17-Sep-1934, 81 y.o.   MRN: [id number]  CC: Diabetic shoes  HPI   [name] is a 81yo woman with PMH of DM, CKD, h/o CVA, HLD.   She presents today for a face to face meeting to get home health wound care.  The last time I saw her she had a foot wound on the left foot which was under a callus and worsening.  I was concerned that she would require another amputation and encouraged her to see her orthopedic surgeon, Dr. Sharol Mosley.  Home health wound care was to be set up, but was not able to be ordered Mosley timing.  Now, her foot is much better.  She continues to have sore spots and calluses on her feet.  She has no open wounds, no drainage.  She reports doing what Dr. Sharol Mosley told her too and it got better.   In January, paperwork was filled out for diabetic shoes, but she has not been able to get them.  Our office called the provider and it was noted that no one has answered the phone at [name] residence or returned their calls.  A new Rx for diabetic shoes was placed today.    Her A1C is worsened today to 7.3.  She did not bring in her BS log.  We discussed changes to her diet that could have resulted in the worsening.   Seeing nephrology for CKD (last appointment yesterday), ortho and hematology (for multifactorial anemia)  Obtain nephrology notes.   Review of Systems  Constitutional: Negative for fever, chills and activity change.  Respiratory: Negative for cough, choking and shortness of breath.   Cardiovascular: Negative for chest pain and leg swelling.  Gastrointestinal: Negative for abdominal distention and anal bleeding.  Genitourinary: Positive for frequency. Negative for dysuria, hematuria, enuresis and difficulty urinating.  Musculoskeletal: Negative for back pain and arthralgias.  Skin: Positive for color change. Negative for pallor, rash and wound.  Neurological: Negative for dizziness and light-headedness.         Objective:   Physical Exam  Constitutional: She is oriented to person, place, and time.  Elderly woman, NAD, alert  HENT:  Head: Normocephalic and atraumatic.  Eyes: Conjunctivae are normal. No scleral icterus.  Neck: Neck supple.  Cardiovascular: Normal rate, regular rhythm, normal heart sounds and intact distal pulses.   No murmur heard. Pulmonary/Chest: Effort normal and breath sounds normal. No respiratory distress. She has no wheezes.  Abdominal: Bowel sounds are normal. She exhibits no distension.  Musculoskeletal: She exhibits tenderness. She exhibits no edema.  She has no toes on either foot, somewhat tender to palpation.  Surgical sites are well healed without erythema or swelling.   Lymphadenopathy:    She has no cervical adenopathy.  Neurological: She is alert and oriented to person, place, and time. She exhibits normal muscle tone.  She is wearing diabetic shoes.    Skin: Skin is warm. No rash noted. No erythema.  She has some small calluses, particularly over left foot with pain to palpation.  Scaling skin.   Psychiatric: She has a normal mood and affect. Her behavior is normal.    No labs today.       Assessment & Plan:  3 months for evaluation of DM.

## 2015-07-11 NOTE — Assessment & Plan Note (Signed)
Likely related to DM and HTN.   She last saw Renal yesterday, will obtain notes.    Based on review of labs, she has some worsening over the last few months.  Will need to check at next visit to ensure trend is not rising.   Both BP and DM are relatively well controlled, continue to monitor.

## 2015-07-11 NOTE — Patient Instructions (Signed)
[name] - -   It was very good to see you!  Keep doing the same with your medications and following up with your specialists.   Please come back to see me in 3 months to check on your diabetes.  Your A1C is slightly higher today and given your history of stroke, we will need to see you sooner.   I will place a referral for home health to check on your feet.  We will also look in to why you have not been able to get your diabetic shoes.   Thank you!  Please call with questions!

## 2015-07-11 NOTE — Assessment & Plan Note (Signed)
A1C 7.3.  She reports taking her medications consistently which include januvia, glipizide.  She is not on metformin due to renal function.    Advised her to bring in her BS log at next visit as well as all of her medications.    BP is well controlled  Continue current therapy, check BMET at next visit.

## 2015-07-12 ENCOUNTER — Telehealth: Payer: Self-pay | Admitting: Licensed Clinical Social Worker

## 2015-07-12 ENCOUNTER — Telehealth: Payer: Self-pay | Admitting: Internal Medicine

## 2015-07-12 NOTE — Telephone Encounter (Signed)
CSW received call from [facility]. Due to pt's wound improving and no current open wounds. HH unable to provide service.  Arville Go reviewed chart note and unable to find qualifying dx for [facility] RN.  Pt will be notified, PCP notified.

## 2015-07-12 NOTE — Telephone Encounter (Signed)
Spoke with [employer] [facility]) in regards to this patient's Order for her Diabetic Shoes.  They rec'd the order back in [place] of 2016 and reached out to the patient's family on three separate occasions with no response.  Per [employer] No paperwork is needed as of 2017.  Only a RX needs to be written for her Bilateral Diabetic Shoes with Toe Fillers.  Rec'd RX from Dr. Daryll Drown and it was faxed to [phone].

## 2015-07-12 NOTE — Telephone Encounter (Signed)
CSW placed call to Ms. Harwick to f/u on [place] referral.  Pt notified referral sent to [facility], as they had accepted initial referral.  Ms. Penniman aware she will receive a call from [facility] within the next 48 hr for RN services.

## 2015-07-30 ENCOUNTER — Other Ambulatory Visit: Payer: Self-pay | Admitting: Nurse Practitioner

## 2015-08-01 ENCOUNTER — Other Ambulatory Visit: Payer: Self-pay | Admitting: Internal Medicine

## 2015-08-01 NOTE — Telephone Encounter (Signed)
Needs June appt DM F/U

## 2015-08-02 ENCOUNTER — Ambulatory Visit: Payer: Self-pay

## 2015-08-02 ENCOUNTER — Other Ambulatory Visit: Payer: Self-pay

## 2015-08-14 ENCOUNTER — Telehealth: Payer: Self-pay | Admitting: Internal Medicine

## 2015-08-14 NOTE — Telephone Encounter (Signed)
Pt needs diab shoes, nurse will fax form

## 2015-08-14 NOTE — Telephone Encounter (Signed)
Needs Orders

## 2015-08-15 NOTE — Telephone Encounter (Signed)
Have called [facility] nurse back, lm for rtc

## 2015-08-15 NOTE — Telephone Encounter (Signed)
Has patient answered phone for shoe provider?  That was the issue last time.   Thanks

## 2015-08-17 ENCOUNTER — Encounter: Payer: Self-pay | Admitting: *Deleted

## 2015-08-20 NOTE — Telephone Encounter (Signed)
i have lm for this nurse now 3 ttimes, closing encounter

## 2015-08-22 ENCOUNTER — Telehealth: Payer: Self-pay | Admitting: Hematology

## 2015-08-22 NOTE — Telephone Encounter (Signed)
cld pt and left message to call back if wanted to move appt to 4/12.

## 2015-08-23 ENCOUNTER — Other Ambulatory Visit: Payer: Self-pay | Admitting: Nurse Practitioner

## 2015-08-24 ENCOUNTER — Other Ambulatory Visit: Payer: Self-pay | Admitting: Internal Medicine

## 2015-08-24 NOTE — Telephone Encounter (Signed)
Second was a duplicate.  Thanks!

## 2015-08-29 ENCOUNTER — Telehealth: Payer: Self-pay | Admitting: Hematology

## 2015-08-29 ENCOUNTER — Other Ambulatory Visit: Payer: Self-pay | Admitting: Internal Medicine

## 2015-08-29 NOTE — Telephone Encounter (Signed)
pt cld to r/s appt-gave pt r/s rime & date for 4/21 @2 

## 2015-08-30 ENCOUNTER — Encounter: Payer: Self-pay | Admitting: Hematology

## 2015-08-30 ENCOUNTER — Other Ambulatory Visit: Payer: Self-pay | Admitting: Internal Medicine

## 2015-08-30 ENCOUNTER — Ambulatory Visit: Payer: Self-pay

## 2015-08-30 ENCOUNTER — Other Ambulatory Visit: Payer: Self-pay

## 2015-08-30 DIAGNOSIS — I10 Essential (primary) hypertension: Secondary | ICD-10-CM

## 2015-09-02 NOTE — Progress Notes (Signed)
This encounter was created in error - please disregard.

## 2015-09-04 ENCOUNTER — Other Ambulatory Visit: Payer: Self-pay | Admitting: Internal Medicine

## 2015-09-07 ENCOUNTER — Other Ambulatory Visit: Payer: Self-pay

## 2015-09-07 ENCOUNTER — Ambulatory Visit: Payer: Self-pay

## 2015-09-07 ENCOUNTER — Encounter: Payer: Self-pay | Admitting: Hematology

## 2015-09-07 NOTE — Progress Notes (Signed)
This encounter was created in error - please disregard.

## 2015-09-10 ENCOUNTER — Telehealth: Payer: Self-pay | Admitting: Hematology

## 2015-09-10 NOTE — Telephone Encounter (Signed)
s.w. pt and gv appt to pt....pt ok and aware

## 2015-09-13 ENCOUNTER — Encounter: Payer: Self-pay | Admitting: Hematology

## 2015-09-13 ENCOUNTER — Telehealth: Payer: Self-pay | Admitting: Hematology

## 2015-09-13 ENCOUNTER — Other Ambulatory Visit (HOSPITAL_BASED_OUTPATIENT_CLINIC_OR_DEPARTMENT_OTHER): Payer: Medicare Other

## 2015-09-13 ENCOUNTER — Ambulatory Visit (HOSPITAL_BASED_OUTPATIENT_CLINIC_OR_DEPARTMENT_OTHER): Payer: Medicare Other

## 2015-09-13 ENCOUNTER — Ambulatory Visit (HOSPITAL_BASED_OUTPATIENT_CLINIC_OR_DEPARTMENT_OTHER): Payer: Medicare Other | Admitting: Hematology

## 2015-09-13 VITALS — BP 130/86 | HR 105 | Temp 97.9°F | Resp 18 | Ht 67.0 in | Wt 177.4 lb

## 2015-09-13 DIAGNOSIS — D631 Anemia in chronic kidney disease: Secondary | ICD-10-CM

## 2015-09-13 DIAGNOSIS — D472 Monoclonal gammopathy: Secondary | ICD-10-CM | POA: Diagnosis not present

## 2015-09-13 DIAGNOSIS — N183 Chronic kidney disease, stage 3 (moderate): Secondary | ICD-10-CM | POA: Diagnosis not present

## 2015-09-13 DIAGNOSIS — N189 Chronic kidney disease, unspecified: Secondary | ICD-10-CM | POA: Diagnosis not present

## 2015-09-13 DIAGNOSIS — E538 Deficiency of other specified B group vitamins: Secondary | ICD-10-CM | POA: Diagnosis not present

## 2015-09-13 LAB — CBC & DIFF AND RETIC
BASO%: 0.2 % (ref 0.0–2.0)
Basophils Absolute: 0 10*3/uL (ref 0.0–0.1)
EOS%: 5.4 % (ref 0.0–7.0)
Eosinophils Absolute: 0.5 10*3/uL (ref 0.0–0.5)
HCT: 22.9 % — ABNORMAL LOW (ref 34.8–46.6)
HGB: 7 g/dL — ABNORMAL LOW (ref 11.6–15.9)
Immature Retic Fract: 11.3 % — ABNORMAL HIGH (ref 1.60–10.00)
LYMPH#: 1.7 10*3/uL (ref 0.9–3.3)
LYMPH%: 17.7 % (ref 14.0–49.7)
MCH: 25.7 pg (ref 25.1–34.0)
MCHC: 30.6 g/dL — AB (ref 31.5–36.0)
MCV: 84.2 fL (ref 79.5–101.0)
MONO#: 1.1 10*3/uL — AB (ref 0.1–0.9)
MONO%: 11.6 % (ref 0.0–14.0)
NEUT%: 65.1 % (ref 38.4–76.8)
NEUTROS ABS: 6.1 10*3/uL (ref 1.5–6.5)
PLATELETS: 157 10*3/uL (ref 145–400)
RBC: 2.72 10*6/uL — AB (ref 3.70–5.45)
RDW: 17.7 % — ABNORMAL HIGH (ref 11.2–14.5)
RETIC CT ABS: 44.06 10*3/uL (ref 33.70–90.70)
Retic %: 1.62 % (ref 0.70–2.10)
WBC: 9.3 10*3/uL (ref 3.9–10.3)

## 2015-09-13 MED ORDER — CEPHALEXIN 500 MG PO CAPS: 500.0000 mg | ORAL_CAPSULE | Freq: Three times a day (TID) | ORAL | Status: DC

## 2015-09-13 MED ORDER — DARBEPOETIN ALFA 200 MCG/0.4ML IJ SOSY
200.0000 ug | PREFILLED_SYRINGE | Freq: Once | INTRAMUSCULAR | Status: AC
  Administered 2015-09-13: 200 ug via SUBCUTANEOUS
  Filled 2015-09-13: qty 0.4

## 2015-09-13 NOTE — Progress Notes (Signed)
[name]  Stacy Chiquito, MD [place] [address]  DIAGNOSIS: Anemia, unspecified anemia type  CHIEF COMPLAIN: Follow up MGUS and anemia.  CURRENT THERAPY: Vitamin B12 shots every 8 weeks and aranesp 200 mcg every 4 weeks for a hemoglobin less than or equal to 11, started on 01/09/2013. Held after 07/2014 due to her stroke,  and restarted on 03/07/2015   PROBLEM LIST:  1. Chronic anemia.  2. Renal insufficiency.  3. Atrial fibrillation.  4. 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  5. Low vitamin B12 level.  6. Osteomyelitis of the left foot status post left mid foot amputation  on 05/30/2010.  7. Status post amputation of toes of right foot in December 2012.  8. Hypertension.  9. Diabetes mellitus.  10.Venous insufficiency.  11.Dyslipidemia.  12.Status post total thyroidectomy for multinodular goiter on  03/13/2006.  13.History of asthma.  14.Thrombocytopenia with variable platelet count first noted 08/2010,  suspect chronic idiopathic thrombocytopenic purpura.  15. 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  INTERVAL HISTORY: [name] 81 y.o. female with a history of chronic kidney disease complicated by anemia and Vit[place] B12 deficiency is here for follow-up.     she presents to the clinic by herself. She came in with a walker. She reports she has a small open wound in the left low extremity above ankle,  Which started a few days ago. She has been scratching, she feels she needs antibiotics.  She missed her Aranesp injection last months, her hemoglobin is 7.0 today. She states she feels about same, moderate fatigue no significant change, she is able to take care of herself at home. She denies any chest pain or other new symptoms.   MEDICAL HISTORY: Past Medical History  Diagnosis Date  . Hypertension   . Hyperlipidemia   . Chronic venous  insufficiency   . Osteomyelitis ([place])     s/p Rt 2nd toe and left 5 toes  amputation in 1/12  by Dr. Sharol Given  . Diabetes mellitus   . Anemia     b12 def, iron def, follow at cancer center, gets B12 and  another injection there. Could not remeber the name. Dr. Ralene Ok is her  cancer doctor  . Thyroid disease     hypothyroidism h/o hyperthyroidism s/p ablation/ectomy  . Asthma   . Blood transfusion     two or more yrs ago  . Hypothyroidism     thyroid removed 4 or more yrs ago  . Atrial fibrillation ([place])   . Chronic renal insufficiency   . MGUS (monoclonal gammopathy of unknown significance)   . Stroke [facility])      ALLERGIES:  has No Known Allergies.  MEDICATIONS: has a current medication list which includes the following prescription(s): atorvastatin, onetouch verio flex system, calcitriol, vitamin d3, eliquis, furosemide, glipizide, glucose blood, januvia, levothyroxine, lisinopril, loratadine, metoprolol tartrate, onetouch delica lancets fine, pantoprazole, polyethylene glycol, sennosides-docusate sodium, and hydrocodone-acetaminophen.  SURGICAL HISTORY:  Past Surgical History  Procedure Laterality Date  . Eye surgery      cat ext ou  . Appendectomy      teenager  . Amputation  04/30/2011    Procedure: AMPUTATION FOOT;  Surgeon: Newt Minion, MD;  Location: [place];  Service: Orthopedics;  Laterality: Right;  Right Midfoot Amputation  . Esophagogastroduodenoscopy N/A 12/15/2014    Procedure: ESOPHAGOGASTRODUODENOSCOPY (EGD);  Surgeon: Norberto Sorenson  Sindy Guadeloupe, MD;  Location: [facility] ENDOSCOPY;  Service: Endoscopy;  Laterality: N/A;  . Colonoscopy N/A 12/17/2014    Procedure: COLONOSCOPY;  Surgeon: Ladene Artist, MD;  Location: [facility] ENDOSCOPY;  Service: Endoscopy;  Laterality: N/A;    REVIEW OF SYSTEMS:   Constitutional: Denies fevers, chills or abnormal weight loss Eyes: Denies blurriness of vision Ears, nose, mouth, throat, and face: Denies mucositis or sore throat Respiratory: Denies cough,  dyspnea or wheezes Cardiovascular: Denies palpitation, chest discomfort or lower extremity swelling Gastrointestinal:  Denies nausea, heartburn or change in bowel habits Skin: Denies abnormal skin rashes Lymphatics: Denies new lymphadenopathy or easy bruising Neurological:Denies numbness, tingling or new weaknesses Behavioral/Psych: Mood is stable, no new changes  All other systems were reviewed with the patient and are negative.  PHYSICAL EXAMINATION: ECOG PERFORMANCE STATUS: 3  Blood pressure 130/86, pulse 105, temperature 97.9 F (36.6 C), temperature source Oral, resp. rate 18, height 5' 7"  (1.702 m), weight 177 lb 6.4 oz (80.468 kg).  GENERAL:alert, no distress and comfortable; chronically ill elderly female who uses a walker.  SKIN: skin color, texture, turgor are normal, no rashes or significant lesions, except a 2cm shallow skin ulcer in left LE above ankle, with surrounding skin erythema, no significant discharge  EYES: normal, Conjunctiva are pink and non-injected, sclera clear OROPHARYNX:no exudate, no erythema and lips, buccal mucosa, and tongue normal  NECK: supple, thyroid normal size, non-tender, without nodularity LYMPH:  no palpable lymphadenopathy in the cervical, axillary or supraclavicular LUNGS: clear to auscultation and percussion with normal breathing effort HEART: irregular rate & rhythm and no murmurs and 1+ lower extremity edema ABDOMEN:abdomen soft, non-tender and normal bowel sounds Musculoskeletal:no cyanosis of digits and no clubbing; s/p bilateral toes amputations as noted above.  NEURO: alert & oriented x 3 with fluent speech, ambulates via a wheelchair, she has mild left arm weakness, other cranial nerve exam were unremarkable, no focal motor or sensation deficiency.  LABORATORY DATA: CBC Latest Ref Rng 09/13/2015 07/05/2015 06/07/2015  WBC 3.9 - 10.3 10e3/uL 9.3 7.3 8.4  Hemoglobin 11.6 - 15.9 g/dL 7.0(L) 10.4(L) 9.4(L)  Hematocrit 34.8 - 46.6 % 22.9(L)  33.5(L) 30.0(L)  Platelets 145 - 400 10e3/uL 157 155 138(L)    CMP Latest Ref Rng 07/05/2015 07/05/2015 05/02/2015  Glucose 70 - 140 mg/dl 140 - 81  BUN 7.0 - 26.0 mg/dL 60.8(H) - 63.1(H)  Creatinine 0.6 - 1.1 mg/dL 3.2(HH) - 3.0(HH)  Sodium 136 - 145 mEq/L 141 - 141  Potassium 3.5 - 5.1 mEq/L 4.9 - 4.7  Chloride 97 - 108 mmol/L - - -  CO2 22 - 29 mEq/L 21(L) - 18(L)  Calcium 8.4 - 10.4 mg/dL 9.4 - 8.9  Total Protein 6.0 - 8.5 g/dL 8.3 7.5 7.9  Total Bilirubin 0.20 - 1.20 mg/dL 0.65 - 0.49  Alkaline Phos 40 - 150 U/L 86 - 81  AST 5 - 34 U/L 15 - 14  ALT 0 - 55 U/L 10 - 10   M-protein  10/11/2014: 0.3 02/28/2015: 0.2 05/02/2015: 0.3 07/05/2015: not det  IgA (69-380 mg/dl) 10/11/2014: 622 02/28/2015: 566 05/02/2015: 637  RADIOGRAPHIC STUDIES: 1. Digital screening mammogram on 07/26/2010 was negative.  2. MRI of the right forefoot without IV contrast on 08/20/2010 showed  apparent new soft tissue ulceration distal to the third metatarsal status post prior resection of the third toe. No soft tissue abscess was demonstrated. There was interval resolution of the previously demonstrated marrow edema within the third metatarsal. No evidence for osteomyelitis. There  was stable mid foot degenerative changes and chronic myopathic changes.  3. CT of the cervical spine without IV contrast on 10/21/2010 showed  degenerative changes in the cervical spine. No displaced fractures  were identified.  4. CT of the head without IV contrast on 10/21/2010 showed no evidence  for acute intracranial hemorrhage, mass lesion or acute infarct. There was some mild diffuse atrophy. There was a subcutaneous hematoma over the left anterior frontal/periorbital region. No underlying skull fracture or acute intracranial abnormality.  5. Chest x-ray, 2 view, from 04/28/2011 showed stable moderate  cardiomegaly with no active lung disease. There was probable mild  scarring at the lung bases.   ASSESSMENT: [name] 81 y.o. female with a history of multiple comorbidities, including anemia, IgA MGUS, renal failure, B12 deficiency, etc.  1. Anemia and MGUS, rule out MM  --her anemia has been getting slightly worse over the past year, despite Arenasp injection every 1-2 months. -Given her history of IgA MGUS, renal failure, and worsening anemia, multiple myeloma needs to be ruled out. Her previous M protein level was low, IgA and light chain levels are elevated. -Her Skeletal survey was normal.  -I have discussed bone marrow biopsy several times, and she repeatedly declined due to the concern of complications from procedure such as pain. I'll hold on for now. -her M-protein has been remain to be low and stable overall, will continue monitoring  -Continue Aranesp 200 g injection monthly, she missed a dose last month and anemia is much worse today.  I encouraged her to keep her monthly injection appointments - we'll continue Aranesp injection. - we discussed blood transfusion , she declined,  She is not very symmetric. I repeated her CBC in 2 weeks,  We'll consider blood transfusion if hemoglobin less than 7. She agrees -I'll check her iron studies and B12 level on next visit, to see if she still needs B12 injection  2. Left feet wound -she will continue follow up with her podiatrist  - I'll give her a course of Keflex today  3. Right front lobe CVA in 09/2014 -likely related to her AF without anticoagulation .  -She is recovering slowly.  -She is on eliquis now   4. Hypertension, diabetes, A. Fib, CKD, and other medical problems -She will continue follow-up with her primary care physician. She is on Eliquis now for A. Fib  All questions were answered. The patient knows to call the clinic with any problems, questions or concerns. We can certainly see the patient much sooner if necessary.  Plan - repeat a CBC in 2 weeks, [name] blood transfusion if her hemoglobin less than 7 -Continue Aranesp  injection monthly  - I called in Keflex to her pharmacy today -I'll see her back in 2 months   I spent 20 minutes counseling th between-year-old I think that she ise patient face to face. The total time spent in the appointment was 25 minutes.    Truitt Merle, MD 09/13/2015

## 2015-09-13 NOTE — Telephone Encounter (Signed)
Gave pt appt & avs °

## 2015-09-19 ENCOUNTER — Ambulatory Visit (INDEPENDENT_AMBULATORY_CARE_PROVIDER_SITE_OTHER): Payer: Medicare Other | Admitting: Internal Medicine

## 2015-09-19 ENCOUNTER — Encounter: Payer: Self-pay | Admitting: Internal Medicine

## 2015-09-19 VITALS — BP 129/57 | HR 106 | Temp 98.0°F | Wt 180.0 lb

## 2015-09-19 DIAGNOSIS — L97921 Non-pressure chronic ulcer of unspecified part of left lower leg limited to breakdown of skin: Secondary | ICD-10-CM

## 2015-09-19 DIAGNOSIS — L97929 Non-pressure chronic ulcer of unspecified part of left lower leg with unspecified severity: Secondary | ICD-10-CM | POA: Insufficient documentation

## 2015-09-19 DIAGNOSIS — L97821 Non-pressure chronic ulcer of other part of left lower leg limited to breakdown of skin: Secondary | ICD-10-CM

## 2015-09-19 DIAGNOSIS — R21 Rash and other nonspecific skin eruption: Secondary | ICD-10-CM | POA: Diagnosis not present

## 2015-09-19 LAB — GLUCOSE, CAPILLARY: GLUCOSE-CAPILLARY: 128 mg/dL — AB (ref 65–99)

## 2015-09-19 MED ORDER — CETIRIZINE HCL 10 MG PO CHEW: 10.0000 mg | CHEWABLE_TABLET | Freq: Every day | ORAL | Status: DC

## 2015-09-19 MED ORDER — CLOTRIMAZOLE 1 % EX CREA: 1.0000 "application " | TOPICAL_CREAM | Freq: Two times a day (BID) | CUTANEOUS | Status: DC

## 2015-09-19 NOTE — Assessment & Plan Note (Signed)
Unclear for the source of her rash. I had both Dr. Dareen Piano and Dr. Melburn Hake take a look at the rash. Differential includes contact dermatitis (although not sure what could cause her distribution) vs pityrosporum folliculitis vs scabies. Scabies seems less likely since she has no hand involvement. Will treat her with Clotrimazole cream to be applied twice daily to the affected areas. I advised her if her rash does not improve or worsens in the next week to call the clinic. She will need referral to dermatology if her rash persists. I also gave her a prescription for Zyrtec 10 mg daily to help with itching. I recommended to use benadryl as needed if zyrtec not helping.

## 2015-09-19 NOTE — Progress Notes (Signed)
   Subjective:    Patient ID: [name], female    DOB: 09-19-1934, 81 y.o.   MRN: [id number]  HPI [name] is a 81yo woman with PMHx of HTN, AFib on Eliquis, CVA, CKD stage 4, and type 2 DM who presents today with chief complaint of itching on her shoulders and back.  Itching on Shoulders/Back: Reports the itching started about 2 weeks ago. She states the itching started on her shoulders and then her back and now has some itching on her chest that started today. She denies any new medications (reports itching started well before Keflex was started-see below), new soaps/detergents/lotions, new foods, and throat/tongue swelling. She has tried an OTC "cream" at home with no relief. She denies any itching or rash on her legs, abdomen, or hands. She notes she does sit outside a lot.   Left LE Ulcer: Reports her wound started about 1 month ago. She states it first started out as a boil and then opened up. She reports clear drainage. She denies any pain. She has been applying Neosporin to the area and was started on Keflex by Dr. Burr Medico on 4/27 which she has been taking. She notes she had a similar wound about 8 months ago on her left leg near her knee which cleared up after antibiotics. She denies any fevers or chills.    Review of Systems General: Denies night sweats, changes in weight, changes in appetite HEENT: Denies headaches, ear pain, changes in vision, rhinorrhea, sore throat CV: Denies CP, palpitations, SOB, orthopnea Pulm: Denies SOB, cough, wheezing GI: Denies abdominal pain, nausea, vomiting, diarrhea, constipation, melena, hematochezia GU: Denies dysuria, hematuria, frequency Msk: Denies muscle cramps, joint pains Neuro: Denies weakness, numbness, tingling Skin: Denies bruising Psych: Denies depression, anxiety, hallucinations    Objective:   Physical Exam General: alert, sitting up, NAD HEENT: [place]/AT, EOMI, sclera anicteric, mucus membranes moist  CV: RRR, no m/g/r Pulm: CTA  bilaterally, breaths non-labored Ext: There is a small ulcer on the medial aspect of her left lower leg near the ankle. No significant drainage, no erythema surrounding. No tenderness to palpation.  Neuro: alert and oriented x 3 Skin: She has multiple erythematous papules present on her bilateral shoulders. Similar papules are noted on her lower back, both sides. She is itching constantly throughout the exam. No lesions on her hands.      Assessment & Plan:  Please refer to A&P documentation.

## 2015-09-19 NOTE — Patient Instructions (Signed)
   General Instructions: - Apply clotrimazole cream to the affected areas on your shoulders and back - If your rash does not improve in the next 1 week, please call the clinic as we should refer you to dermatology - Start taking Zyrtec 10 mg daily. Can also take benadryl as needed for itching - Continue Keflex for your left leg wound   Please bring your medicines with you each time you come to clinic.  Medicines may include prescription medications, over-the-counter medications, herbal remedies, eye drops, vitamins, or other pills.   Progress Toward Treatment Goals:  Treatment Goal 01/31/2015  Hemoglobin A1C at goal  Blood pressure unchanged    Self Care Goals & Plans:  Self Care Goal 09/19/2015  Manage my medications take my medicines as prescribed; bring my medications to every visit; refill my medications on time  Monitor my health keep track of my blood pressure; bring my glucose meter and log to each visit; keep track of my blood glucose  Eat healthy foods eat more vegetables; eat foods that are low in salt; eat baked foods instead of fried foods  Be physically active find an activity I enjoy    Home Blood Glucose Monitoring 10/25/2012  Check my blood sugar (No Data)  When to check my blood sugar before breakfast     Care Management & Community Referrals:  Referral 10/25/2012  Referrals made for care management support none needed

## 2015-09-19 NOTE — Progress Notes (Signed)
Internal Medicine Clinic Attending  I saw and evaluated the patient.  I personally confirmed the key portions of the history and exam documented by Dr. Rivet and I reviewed pertinent patient test results.  The assessment, diagnosis, and plan were formulated together and I agree with the documentation in the resident's note. 

## 2015-09-19 NOTE — Assessment & Plan Note (Signed)
Ulcer appears to be limited to the skin surface. Recommended to continue Keflex course and keep clean by washing with warm water/soap. Will continue to monitor closely.

## 2015-09-27 ENCOUNTER — Other Ambulatory Visit: Payer: Self-pay

## 2015-09-28 ENCOUNTER — Other Ambulatory Visit: Payer: Self-pay | Admitting: Internal Medicine

## 2015-10-09 ENCOUNTER — Other Ambulatory Visit: Payer: Self-pay | Admitting: Hematology

## 2015-10-09 ENCOUNTER — Telehealth: Payer: Self-pay | Admitting: *Deleted

## 2015-10-09 NOTE — Telephone Encounter (Signed)
"  I need a refill on the antibiotic Dr, Burr Medico ordered for my boil on the leg.  I now have blisters on the left foot.  I will be returning my shoes tomorrow because I think they're too tight."  Return number [phone].  Advised she call the foot center for further evaluation.  She will call them now to be worked in soon but still asking for refill.

## 2015-10-09 NOTE — Telephone Encounter (Signed)
Spoke with pt and informed pt that per Dr. Burr Medico, pt needs to follow up with her podiatrist for the blisters on her left foot.  Informed pt that Dr. Burr Medico will not refill antibiotics.   Pt voiced understanding and stated she has appt with her foot provider on Thursday  10/11/15.

## 2015-10-11 ENCOUNTER — Telehealth: Payer: Self-pay | Admitting: Hematology

## 2015-10-11 ENCOUNTER — Other Ambulatory Visit (HOSPITAL_BASED_OUTPATIENT_CLINIC_OR_DEPARTMENT_OTHER): Payer: Medicare Other

## 2015-10-11 ENCOUNTER — Other Ambulatory Visit: Payer: Self-pay | Admitting: *Deleted

## 2015-10-11 ENCOUNTER — Ambulatory Visit (HOSPITAL_BASED_OUTPATIENT_CLINIC_OR_DEPARTMENT_OTHER): Payer: Medicare Other

## 2015-10-11 ENCOUNTER — Ambulatory Visit: Payer: Medicare Other

## 2015-10-11 ENCOUNTER — Ambulatory Visit (HOSPITAL_COMMUNITY)
Admission: RE | Admit: 2015-10-11 | Discharge: 2015-10-11 | Disposition: A | Discharge status: Home / Self Care | Payer: Medicare Other | Source: Ambulatory Visit | Attending: Hematology | Admitting: Hematology

## 2015-10-11 VITALS — BP 141/63 | HR 111 | Temp 97.4°F

## 2015-10-11 DIAGNOSIS — N189 Chronic kidney disease, unspecified: Principal | ICD-10-CM

## 2015-10-11 DIAGNOSIS — N183 Chronic kidney disease, stage 3 (moderate): Secondary | ICD-10-CM | POA: Diagnosis not present

## 2015-10-11 DIAGNOSIS — D631 Anemia in chronic kidney disease: Secondary | ICD-10-CM

## 2015-10-11 DIAGNOSIS — E538 Deficiency of other specified B group vitamins: Secondary | ICD-10-CM

## 2015-10-11 DIAGNOSIS — D472 Monoclonal gammopathy: Secondary | ICD-10-CM

## 2015-10-11 LAB — COMPREHENSIVE METABOLIC PANEL
ALBUMIN: 3.3 g/dL — AB (ref 3.5–5.0)
ALK PHOS: 79 U/L (ref 40–150)
ANION GAP: 13 meq/L — AB (ref 3–11)
AST: 12 U/L (ref 5–34)
BILIRUBIN TOTAL: 0.44 mg/dL (ref 0.20–1.20)
BUN: 60.3 mg/dL — ABNORMAL HIGH (ref 7.0–26.0)
CO2: 19 meq/L — AB (ref 22–29)
CREATININE: 3.6 mg/dL — AB (ref 0.6–1.1)
Calcium: 8.1 mg/dL — ABNORMAL LOW (ref 8.4–10.4)
Chloride: 111 mEq/L — ABNORMAL HIGH (ref 98–109)
EGFR: 13 mL/min/{1.73_m2} — AB (ref >90)
GLUCOSE: 138 mg/dL (ref 70–140)
Potassium: 4.6 mEq/L (ref 3.5–5.1)
Sodium: 143 mEq/L (ref 136–145)
TOTAL PROTEIN: 7.8 g/dL (ref 6.4–8.3)

## 2015-10-11 LAB — ABO/RH: ABO/RH(D): O NEG

## 2015-10-11 LAB — CBC & DIFF AND RETIC
BASO%: 0.3 % (ref 0.0–2.0)
BASOS ABS: 0 10*3/uL (ref 0.0–0.1)
EOS ABS: 0.4 10*3/uL (ref 0.0–0.5)
EOS%: 5.4 % (ref 0.0–7.0)
HEMATOCRIT: 21.1 % — AB (ref 34.8–46.6)
HEMOGLOBIN: 6.4 g/dL — AB (ref 11.6–15.9)
IMMATURE RETIC FRACT: 14.5 % — AB (ref 1.60–10.00)
LYMPH%: 14 % (ref 14.0–49.7)
MCH: 24.3 pg — ABNORMAL LOW (ref 25.1–34.0)
MCHC: 30.3 g/dL — ABNORMAL LOW (ref 31.5–36.0)
MCV: 80.2 fL (ref 79.5–101.0)
MONO#: 0.8 10*3/uL (ref 0.1–0.9)
MONO%: 11.1 % (ref 0.0–14.0)
NEUT%: 69.2 % (ref 38.4–76.8)
NEUTROS ABS: 5.3 10*3/uL (ref 1.5–6.5)
PLATELETS: 215 10*3/uL (ref 145–400)
RBC: 2.63 10*6/uL — ABNORMAL LOW (ref 3.70–5.45)
RDW: 17.3 % — ABNORMAL HIGH (ref 11.2–14.5)
RETIC CT ABS: 38.4 10*3/uL (ref 33.70–90.70)
Retic %: 1.46 % (ref 0.70–2.10)
WBC: 7.6 10*3/uL (ref 3.9–10.3)
lymph#: 1.1 10*3/uL (ref 0.9–3.3)
nRBC: 0 % (ref 0–0)

## 2015-10-11 MED ORDER — DARBEPOETIN ALFA 200 MCG/0.4ML IJ SOSY
200.0000 ug | PREFILLED_SYRINGE | Freq: Once | INTRAMUSCULAR | Status: AC
  Administered 2015-10-11: 200 ug via SUBCUTANEOUS
  Filled 2015-10-11: qty 0.4

## 2015-10-11 NOTE — Patient Instructions (Signed)
Darbepoetin Alfa injection What is this medicine? DARBEPOETIN ALFA (dar be POE e tin AL fa) helps your body make more red blood cells. It is used to treat anemia caused by chronic kidney failure and chemotherapy. This medicine may be used for other purposes; ask your health care provider or pharmacist if you have questions. What should I tell my health care provider before I take this medicine? They need to know if you have any of these conditions: -blood clotting disorders or history of blood clots -cancer patient not on chemotherapy -cystic fibrosis -heart disease, such as angina, heart failure, or a history of a heart attack -hemoglobin level of 12 g/dL or greater -high blood pressure -low levels of folate, iron, or vitamin B12 -seizures -an unusual or allergic reaction to darbepoetin, erythropoietin, albumin, hamster proteins, latex, other medicines, foods, dyes, or preservatives -pregnant or trying to get pregnant -breast-feeding How should I use this medicine? This medicine is for injection into a vein or under the skin. It is usually given by a health care professional in a hospital or clinic setting. If you get this medicine at home, you will be taught how to prepare and give this medicine. Do not shake the solution before you withdraw a dose. Use exactly as directed. Take your medicine at regular intervals. Do not take your medicine more often than directed. It is important that you put your used needles and syringes in a special sharps container. Do not put them in a trash can. If you do not have a sharps container, call your pharmacist or healthcare provider to get one. Talk to your pediatrician regarding the use of this medicine in children. While this medicine may be used in children as [place] as 1 year for selected conditions, precautions do apply. Overdosage: If you think you have taken too much of this medicine contact a poison control center or emergency room at once. NOTE:  This medicine is only for you. Do not share this medicine with others. What if I miss a dose? If you miss a dose, take it as soon as you can. If it is almost time for your next dose, take only that dose. Do not take double or extra doses. What may interact with this medicine? Do not take this medicine with any of the following medications: -epoetin alfa This list may not describe all possible interactions. Give your health care provider a list of all the medicines, herbs, non-prescription drugs, or dietary supplements you use. Also tell them if you smoke, drink alcohol, or use illegal drugs. Some items may interact with your medicine. What should I watch for while using this medicine? Visit your prescriber or health care professional for regular checks on your progress and for the needed blood tests and blood pressure measurements. It is especially important for the doctor to make sure your hemoglobin level is in the desired range, to limit the risk of potential side effects and to give you the best benefit. Keep all appointments for any recommended tests. Check your blood pressure as directed. Ask your doctor what your blood pressure should be and when you should contact him or her. As your body makes more red blood cells, you may need to take iron, folic acid, or vitamin B supplements. Ask your doctor or health care provider which products are right for you. If you have kidney disease continue dietary restrictions, even though this medication can make you feel better. Talk with your doctor or health care professional about the   foods you eat and the vitamins that you take. What side effects may I notice from receiving this medicine? Side effects that you should report to your doctor or health care professional as soon as possible: -allergic reactions like skin rash, itching or hives, swelling of the face, lips, or tongue -breathing problems -changes in vision -chest pain -confusion, trouble speaking  or understanding -feeling faint or lightheaded, falls -high blood pressure -muscle aches or pains -pain, swelling, warmth in the leg -rapid weight gain -severe headaches -sudden numbness or weakness of the face, arm or leg -trouble walking, dizziness, loss of balance or coordination -seizures (convulsions) -swelling of the ankles, feet, hands -unusually weak or tired Side effects that usually do not require medical attention (report to your doctor or health care professional if they continue or are bothersome): -diarrhea -fever, chills (flu-like symptoms) -headaches -nausea, vomiting -redness, stinging, or swelling at site where injected This list may not describe all possible side effects. Call your doctor for medical advice about side effects. You may report side effects to FDA at 1-800-FDA-1088. Where should I keep my medicine? Keep out of the reach of children. Store in a refrigerator between 2 and 8 degrees C (36 and 46 degrees F). Do not freeze. Do not shake. Throw away any unused portion if using a single-dose vial. Throw away any unused medicine after the expiration date. NOTE: This sheet is a summary. It may not cover all possible information. If you have questions about this medicine, talk to your doctor, pharmacist, or health care provider.    2016, Elsevier/Gold Standard. (2008-04-18 10:23:57)  

## 2015-10-11 NOTE — Telephone Encounter (Signed)
per pof to sch pt appt-gave pt copy of avs °

## 2015-10-12 ENCOUNTER — Ambulatory Visit (HOSPITAL_COMMUNITY)
Admission: RE | Admit: 2015-10-12 | Discharge: 2015-10-12 | Disposition: A | Discharge status: Home / Self Care | Payer: Medicare Other | Source: Ambulatory Visit | Attending: Internal Medicine | Admitting: Internal Medicine

## 2015-10-12 ENCOUNTER — Other Ambulatory Visit: Payer: Self-pay | Admitting: *Deleted

## 2015-10-12 VITALS — BP 126/68 | HR 100 | Temp 98.3°F | Resp 16

## 2015-10-12 DIAGNOSIS — N189 Chronic kidney disease, unspecified: Principal | ICD-10-CM

## 2015-10-12 DIAGNOSIS — D631 Anemia in chronic kidney disease: Secondary | ICD-10-CM

## 2015-10-12 LAB — PREPARE RBC (CROSSMATCH)

## 2015-10-12 LAB — KAPPA/LAMBDA LIGHT CHAINS
IG LAMBDA FREE LIGHT CHAIN: 76.74 mg/L — AB (ref 5.71–26.30)
Ig Kappa Free Light Chain: 614.5 mg/L — ABNORMAL HIGH (ref 3.30–19.40)
Kappa/Lambda FluidC Ratio: 8.01 — ABNORMAL HIGH (ref 0.26–1.65)

## 2015-10-12 LAB — VITAMIN B12: Vitamin B12: 476 pg/mL (ref 211–946)

## 2015-10-12 MED ORDER — SODIUM CHLORIDE 0.9% FLUSH
10.0000 mL | INTRAVENOUS | Status: DC | PRN
Start: 2015-10-12 — End: 2015-10-13

## 2015-10-12 MED ORDER — SODIUM CHLORIDE 0.9 % IV SOLN: 250.0000 mL | Freq: Once | INTRAVENOUS | Status: AC

## 2015-10-12 MED ORDER — DIPHENHYDRAMINE HCL 25 MG PO CAPS
25.0000 mg | ORAL_CAPSULE | Freq: Once | ORAL | Status: AC
  Administered 2015-10-12: 25 mg via ORAL
  Filled 2015-10-12: qty 1

## 2015-10-12 MED ORDER — SODIUM CHLORIDE 0.9% FLUSH: 3.0000 mL | INTRAVENOUS | Status: DC | PRN

## 2015-10-12 MED ORDER — HEPARIN SOD (PORK) LOCK FLUSH 100 UNIT/ML IV SOLN: 250.0000 [IU] | INTRAVENOUS | Status: DC | PRN

## 2015-10-12 MED ORDER — SODIUM CHLORIDE 0.9 % IV SOLN
250.0000 mL | Freq: Once | INTRAVENOUS | Status: AC
  Administered 2015-10-12: 250 mL via INTRAVENOUS

## 2015-10-12 MED ORDER — ACETAMINOPHEN 325 MG PO TABS
650.0000 mg | ORAL_TABLET | Freq: Once | ORAL | Status: AC
  Administered 2015-10-12: 650 mg via ORAL
  Filled 2015-10-12: qty 2

## 2015-10-12 MED ORDER — HEPARIN SOD (PORK) LOCK FLUSH 100 UNIT/ML IV SOLN: 500.0000 [IU] | Freq: Every day | INTRAVENOUS | Status: DC | PRN

## 2015-10-12 NOTE — Discharge Instructions (Signed)

## 2015-10-12 NOTE — Progress Notes (Signed)
Diagnosis Association: Anemia associated with chronic renal failure, unspecified stage [id number]  Provider: Dr. Burr Medico  Procedure: Pt received 2 units of PRBCs.  Pt tolerated procedure well.  Post procedure: Pt alert, oriented, and ambulatory to wheel chair. Discharged to home with son. Discharge instructions given with verbal understanding.

## 2015-10-15 LAB — TYPE AND SCREEN
ABO/RH(D): O NEG
Antibody Screen: NEGATIVE
UNIT DIVISION: 0
Unit division: 0

## 2015-10-16 LAB — MULTIPLE MYELOMA PANEL, SERUM
ALBUMIN SERPL ELPH-MCNC: 3.5 g/dL (ref 2.9–4.4)
ALPHA 1: 0.3 g/dL (ref 0.0–0.4)
ALPHA2 GLOB SERPL ELPH-MCNC: 0.9 g/dL (ref 0.4–1.0)
Albumin/Glob SerPl: 1.1 (ref 0.7–1.7)
B-GLOBULIN SERPL ELPH-MCNC: 1.1 g/dL (ref 0.7–1.3)
GAMMA GLOB SERPL ELPH-MCNC: 1.2 g/dL (ref 0.4–1.8)
GLOBULIN, TOTAL: 3.5 g/dL (ref 2.2–3.9)
IgA, Qn, Serum: 588 mg/dL — ABNORMAL HIGH (ref 64–422)
IgM, Qn, Serum: 50 mg/dL (ref 26–217)
Total Protein: 7 g/dL (ref 6.0–8.5)

## 2015-10-19 DIAGNOSIS — L97411 Non-pressure chronic ulcer of right heel and midfoot limited to breakdown of skin: Secondary | ICD-10-CM | POA: Diagnosis not present

## 2015-10-24 ENCOUNTER — Other Ambulatory Visit: Payer: Self-pay | Admitting: Internal Medicine

## 2015-10-24 ENCOUNTER — Ambulatory Visit: Payer: Self-pay | Admitting: Internal Medicine

## 2015-11-02 ENCOUNTER — Other Ambulatory Visit: Payer: Self-pay | Admitting: Internal Medicine

## 2015-11-05 NOTE — Telephone Encounter (Signed)
Just refilled on 4/7 for 30 day supply with 6 refills.  Please have patient call in correct Rx.

## 2015-11-08 ENCOUNTER — Other Ambulatory Visit: Payer: Self-pay | Admitting: *Deleted

## 2015-11-12 ENCOUNTER — Encounter: Payer: Self-pay | Admitting: Hematology

## 2015-11-12 ENCOUNTER — Ambulatory Visit: Payer: Self-pay

## 2015-11-12 ENCOUNTER — Telehealth: Payer: Self-pay | Admitting: Hematology

## 2015-11-12 ENCOUNTER — Other Ambulatory Visit: Payer: Self-pay

## 2015-11-12 NOTE — Telephone Encounter (Signed)
pt called to r/s appt...done....pt ok and aware of new d.t °

## 2015-11-12 NOTE — Progress Notes (Signed)
This encounter was created in error - please disregard.

## 2015-11-14 ENCOUNTER — Ambulatory Visit: Payer: Self-pay | Admitting: Internal Medicine

## 2015-11-14 ENCOUNTER — Telehealth: Payer: Self-pay | Admitting: Pharmacist

## 2015-11-14 NOTE — Telephone Encounter (Signed)
[name] is a 81 y.o. female who was contacted via telephone for monitoring of apixaban (Eliquis) therapy.    ASSESSMENT Indication(s): atrial fibrillation Duration: indefinite  Labs:    Component Value Date/Time   AST 12 10/11/2015 1058   AST 15 12/13/2014 0244   ALT <9 10/11/2015 1058   ALT 11* 12/13/2014 0244   NA 143 10/11/2015 1058   NA 142 01/31/2015 1117   NA 139 12/17/2014 0551   K 4.6 10/11/2015 1058   K 5.4* 01/31/2015 1117   CL 103 01/31/2015 1117   CL 111* 04/06/2012 1031   CO2 19* 10/11/2015 1058   CO2 19 01/31/2015 1117   GLUCOSE 138 10/11/2015 1058   GLUCOSE 105* 01/31/2015 1117   GLUCOSE 126* 12/17/2014 0551   GLUCOSE 94 04/06/2012 1031   HGBA1C 7.3 07/11/2015 1038   HGBA1C * 05/29/2010 1607    7.1 (NOTE)                                                                       According to the ADA Clinical Practice Recommendations for 2011, when HbA1c is used as a screening test:   >=6.5%   Diagnostic of Diabetes Mellitus           (if abnormal result  is confirmed)  5.7-6.4%   Increased risk of developing Diabetes Mellitus  References:Diagnosis and Classification of Diabetes Mellitus,Diabetes [id number] 1):S62-S69 and Standards of Medical Care in         Diabetes - 2011,Diabetes [id number]  (Suppl 1):S11-S61.   BUN 60.3* 10/11/2015 1058   BUN 44* 01/31/2015 1117   BUN 36* 12/17/2014 0551   CREATININE 3.6* 10/11/2015 1058   CREATININE 2.66* 01/31/2015 1117   CREATININE 2.24* 10/25/2012 1158   CALCIUM 8.1* 10/11/2015 1058   CALCIUM 8.2* 01/31/2015 1117   GFRAA 19* 01/31/2015 1117   GFRAA 24* 10/25/2012 1158   WBC 7.6 10/11/2015 1058   WBC 6.9 01/31/2015 1117   WBC 8.8 12/17/2014 0551   HGB 6.4* 10/11/2015 1058   HGB 9.8* 12/17/2014 0551   HCT 21.1* 10/11/2015 1058   HCT 28.3* 01/31/2015 1117   HCT 30.5* 12/17/2014 0551   PLT 215 10/11/2015 1058   PLT 166 01/31/2015 1117   PLT 152 12/17/2014 0551   apixaban (Eliquis) Dose: 5 mg  BID  Safety: Patient has not had recent bleeding/thromboembolic events. Patient reports no recent signs or symptoms of bleeding, no signs of symptoms of thromboembolism. Medication changes: no.  Adherence: Contacted [facility] and they state patient has not filled the Eliquis in 2017, she has a prescription from 06/15/15 with 2 refills that is still on hold. Patient states she has not been taking the Eliquis and will plan to re-start today. She correctly recites the dosing as 1 tablet BID.  Patient Instructions: Patient advised to contact clinic or seek medical attention if signs/symptoms of bleeding or thromboembolism occur. Patient verbalized understanding by repeating back information.  Follow-up Asked pharmacist at [facility] to refill Eliquis and contact the patient for pickup (copay $0). Will follow up in 1-3 months due to adherence concerns.  [facility] Pharmacist  11/14/2015, 5:06 PM

## 2015-11-22 ENCOUNTER — Other Ambulatory Visit: Payer: Self-pay

## 2015-11-26 ENCOUNTER — Other Ambulatory Visit: Payer: Self-pay

## 2015-11-26 ENCOUNTER — Encounter: Payer: Self-pay | Admitting: Hematology

## 2015-11-26 ENCOUNTER — Ambulatory Visit: Payer: Self-pay

## 2015-11-26 ENCOUNTER — Telehealth: Payer: Self-pay | Admitting: *Deleted

## 2015-11-26 ENCOUNTER — Telehealth: Payer: Self-pay | Admitting: Hematology

## 2015-11-26 NOTE — Progress Notes (Signed)
This encounter was created in error - please disregard.

## 2015-11-26 NOTE — Telephone Encounter (Signed)
pt cld to r/s appt-gave pt copy of r/s appt for 7/21@1 :45

## 2015-11-26 NOTE — Telephone Encounter (Signed)
FYI "I've had three bm's today and do not think I can come in today.  I ate Pasta Salad late last night.  Up at 6:00 am, 8:00 am and on commode now for BM."    Tried to assess further but says "It's not diarrhea."  Denies eating this morning, denies use of mira lax.  Not dressed yet awaiting an aid to help.  Asked that she call back to cancel if this does not resolve in the next two hours.

## 2015-12-07 ENCOUNTER — Telehealth: Payer: Self-pay | Admitting: Hematology

## 2015-12-07 ENCOUNTER — Ambulatory Visit (HOSPITAL_BASED_OUTPATIENT_CLINIC_OR_DEPARTMENT_OTHER): Payer: Medicare Other

## 2015-12-07 ENCOUNTER — Ambulatory Visit (HOSPITAL_BASED_OUTPATIENT_CLINIC_OR_DEPARTMENT_OTHER): Payer: Medicare Other | Admitting: Hematology

## 2015-12-07 ENCOUNTER — Encounter: Payer: Self-pay | Admitting: Hematology

## 2015-12-07 ENCOUNTER — Other Ambulatory Visit (HOSPITAL_BASED_OUTPATIENT_CLINIC_OR_DEPARTMENT_OTHER): Payer: Medicare Other

## 2015-12-07 VITALS — BP 155/74 | HR 105 | Temp 97.8°F | Resp 18 | Ht 67.0 in | Wt 196.0 lb

## 2015-12-07 DIAGNOSIS — D472 Monoclonal gammopathy: Secondary | ICD-10-CM

## 2015-12-07 DIAGNOSIS — I10 Essential (primary) hypertension: Secondary | ICD-10-CM

## 2015-12-07 DIAGNOSIS — N184 Chronic kidney disease, stage 4 (severe): Principal | ICD-10-CM

## 2015-12-07 DIAGNOSIS — D631 Anemia in chronic kidney disease: Secondary | ICD-10-CM | POA: Diagnosis not present

## 2015-12-07 DIAGNOSIS — N183 Chronic kidney disease, stage 3 (moderate): Secondary | ICD-10-CM | POA: Diagnosis not present

## 2015-12-07 DIAGNOSIS — E039 Hypothyroidism, unspecified: Secondary | ICD-10-CM

## 2015-12-07 LAB — CBC & DIFF AND RETIC
BASO%: 0.3 % (ref 0.0–2.0)
Basophils Absolute: 0 10*3/uL (ref 0.0–0.1)
EOS%: 4.1 % (ref 0.0–7.0)
Eosinophils Absolute: 0.3 10*3/uL (ref 0.0–0.5)
HCT: 25.9 % — ABNORMAL LOW (ref 34.8–46.6)
HGB: 8 g/dL — ABNORMAL LOW (ref 11.6–15.9)
Immature Retic Fract: 10.6 % — ABNORMAL HIGH (ref 1.60–10.00)
LYMPH%: 11.7 % — ABNORMAL LOW (ref 14.0–49.7)
MCH: 25 pg — AB (ref 25.1–34.0)
MCHC: 30.9 g/dL — AB (ref 31.5–36.0)
MCV: 80.9 fL (ref 79.5–101.0)
MONO#: 0.8 10*3/uL (ref 0.1–0.9)
MONO%: 12.8 % (ref 0.0–14.0)
NEUT%: 71.1 % (ref 38.4–76.8)
NEUTROS ABS: 4.5 10*3/uL (ref 1.5–6.5)
Platelets: 127 10*3/uL — ABNORMAL LOW (ref 145–400)
RBC: 3.2 10*6/uL — ABNORMAL LOW (ref 3.70–5.45)
RDW: 19.7 % — AB (ref 11.2–14.5)
RETIC %: 1.6 % (ref 0.70–2.10)
Retic Ct Abs: 51.2 10*3/uL (ref 33.70–90.70)
WBC: 6.4 10*3/uL (ref 3.9–10.3)
lymph#: 0.7 10*3/uL — ABNORMAL LOW (ref 0.9–3.3)

## 2015-12-07 MED ORDER — DARBEPOETIN ALFA 300 MCG/0.6ML IJ SOSY
300.0000 ug | PREFILLED_SYRINGE | Freq: Once | INTRAMUSCULAR | Status: AC
  Administered 2015-12-07: 300 ug via SUBCUTANEOUS
  Filled 2015-12-07: qty 0.6

## 2015-12-07 NOTE — Telephone Encounter (Signed)
Gave pt cal & avs °

## 2015-12-07 NOTE — Progress Notes (Signed)
[facility] OFFICE PROGRESS NOTE  [name], MD [place] [address]  DIAGNOSIS: Anemia, unspecified anemia type  CHIEF COMPLAIN: Follow up MGUS and anemia.  CURRENT THERAPY: Vitamin B12 shots every 8 weeks and aranesp 200 mcg every 4 weeks for a hemoglobin less than or equal to 11, started on 01/09/2013. Held after 07/2014 due to her stroke,  and restarted on 03/07/2015   PROBLEM LIST:  1. Chronic anemia.  2. Renal insufficiency.  3. Atrial fibrillation.  4. 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  5. Low vitamin B12 level.  6. Osteomyelitis of the left foot status post left mid foot amputation  on 05/30/2010.  7. Status post amputation of toes of right foot in December 2012.  8. Hypertension.  9. Diabetes mellitus.  10.Venous insufficiency.  11.Dyslipidemia.  12.Status post total thyroidectomy for multinodular goiter on  03/13/2006.  13.History of asthma.  14.Thrombocytopenia with variable platelet count first noted 08/2010,  suspect chronic idiopathic thrombocytopenic purpura.  15. 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  INTERVAL HISTORY: [name] 81 y.o. female with a history of chronic kidney disease complicated by anemia and Vit[place] B12 deficiency is here for follow-up.     she presents to the clinic by herself. She came in with a walker. She feels about the same. She has mild to moderate fatigue, able to live independently at home. Her hemoglobin was 6.4 two months ago and she received a blood transfusion. She missed her Aranesp injection last month. She denies any significant dyspnea or chest pain or other new symptoms.  MEDICAL HISTORY: Past Medical History  Diagnosis Date  . Hypertension   . Hyperlipidemia   . Chronic venous insufficiency   . Osteomyelitis ([place])     s/p Rt 2nd toe and left 5 toes  amputation in 1/12  by Dr. Sharol Given  . Diabetes  mellitus   . Anemia     b12 def, iron def, follow at cancer center, gets B12 and  another injection there. Could not remeber the name. Dr. Ralene Ok is her  cancer doctor  . Thyroid disease     hypothyroidism h/o hyperthyroidism s/p ablation/ectomy  . Asthma   . Blood transfusion     two or more yrs ago  . Hypothyroidism     thyroid removed 4 or more yrs ago  . Atrial fibrillation ([place])   . Chronic renal insufficiency   . MGUS (monoclonal gammopathy of unknown significance)   . Stroke [facility])      ALLERGIES:  has No Known Allergies.  MEDICATIONS: has a current medication list which includes the following prescription(s): atorvastatin, onetouch verio flex system, calcitriol, cetirizine, vitamin d3, clotrimazole, eliquis, furosemide, glipizide, glucose blood, hydrocodone-acetaminophen, januvia, levothyroxine, lisinopril, metoprolol tartrate, onetouch delica lancets fine, pantoprazole, polyethylene glycol, and sennosides-docusate sodium.  SURGICAL HISTORY:  Past Surgical History  Procedure Laterality Date  . Eye surgery      cat ext ou  . Appendectomy      teenager  . Amputation  04/30/2011    Procedure: AMPUTATION FOOT;  Surgeon: Newt Minion, MD;  Location: [facility];  Service: Orthopedics;  Laterality: Right;  Right Midfoot Amputation  . Esophagogastroduodenoscopy N/A 12/15/2014    Procedure: ESOPHAGOGASTRODUODENOSCOPY (EGD);  Surgeon: Ladene Artist, MD;  Location: [facility] ENDOSCOPY;  Service: Endoscopy;  Laterality: N/A;  . Colonoscopy N/A 12/17/2014    Procedure: COLONOSCOPY;  Surgeon:  Ladene Artist, MD;  Location: [facility] ENDOSCOPY;  Service: Endoscopy;  Laterality: N/A;    REVIEW OF SYSTEMS:   Constitutional: Denies fevers, chills or abnormal weight loss Eyes: Denies blurriness of vision Ears, nose, mouth, throat, and face: Denies mucositis or sore throat Respiratory: Denies cough, dyspnea or wheezes Cardiovascular: Denies palpitation, chest discomfort or lower extremity  swelling Gastrointestinal:  Denies nausea, heartburn or change in bowel habits Skin: Denies abnormal skin rashes Lymphatics: Denies new lymphadenopathy or easy bruising Neurological:Denies numbness, tingling or new weaknesses Behavioral/Psych: Mood is stable, no new changes  All other systems were reviewed with the patient and are negative.  PHYSICAL EXAMINATION: ECOG PERFORMANCE STATUS: 3  Blood pressure 155/74, pulse 105, temperature 97.8 F (36.6 C), temperature source Oral, resp. rate 18, height 5' 7"  (1.702 m), weight 196 lb (88.905 kg), SpO2 100 %.  GENERAL:alert, no distress and comfortable; chronically ill elderly female who uses a walker.  SKIN: skin color, texture, turgor are normal, no rashes or significant lesions, except a 2cm shallow skin ulcer in left LE above ankle, with surrounding skin erythema, no significant discharge  EYES: normal, Conjunctiva are pink and non-injected, sclera clear OROPHARYNX:no exudate, no erythema and lips, buccal mucosa, and tongue normal  NECK: supple, thyroid normal size, non-tender, without nodularity LYMPH:  no palpable lymphadenopathy in the cervical, axillary or supraclavicular LUNGS: clear to auscultation and percussion with normal breathing effort HEART: irregular rate & rhythm and no murmurs and 1+ lower extremity edema ABDOMEN:abdomen soft, non-tender and normal bowel sounds Musculoskeletal:no cyanosis of digits and no clubbing; s/p bilateral toes amputations as noted above.  NEURO: alert & oriented x 3 with fluent speech, ambulates via a wheelchair, she has mild left arm weakness, other cranial nerve exam were unremarkable, no focal motor or sensation deficiency.  LABORATORY DATA: CBC Latest Ref Rng 12/07/2015 10/11/2015 09/13/2015  WBC 3.9 - 10.3 10e3/uL 6.4 7.6 9.3  Hemoglobin 11.6 - 15.9 g/dL 8.0(L) 6.4(LL) 7.0(L)  Hematocrit 34.8 - 46.6 % 25.9(L) 21.1(L) 22.9(L)  Platelets 145 - 400 10e3/uL 127(L) 215 157    CMP Latest Ref Rng  10/11/2015 10/11/2015 07/05/2015  Glucose 70 - 140 mg/dl 138 - 140  BUN 7.0 - 26.0 mg/dL 60.3(H) - 60.8(H)  Creatinine 0.6 - 1.1 mg/dL 3.6(HH) - 3.2(HH)  Sodium 136 - 145 mEq/L 143 - 141  Potassium 3.5 - 5.1 mEq/L 4.6 - 4.9  CO2 22 - 29 mEq/L 19(L) - 21(L)  Calcium 8.4 - 10.4 mg/dL 8.1(L) - 9.4  Total Protein 6.0 - 8.5 g/dL 7.8 7.0 8.3  Total Bilirubin 0.20 - 1.20 mg/dL 0.44 - 0.65  Alkaline Phos 40 - 150 U/L 79 - 86  AST 5 - 34 U/L 12 - 15  ALT 0 - 55 U/L <9 - 10   M-protein  10/11/2014: 0.3 02/28/2015: 0.2 05/02/2015: 0.3 07/05/2015: not det 10/11/2015: not det   IgA (69-380 mg/dl) 10/11/2014: 622 02/28/2015: 566 05/02/2015: 637 10/11/2015: 588  RADIOGRAPHIC STUDIES: 1. Digital screening mammogram on 07/26/2010 was negative.  2. MRI of the right forefoot without IV contrast on 08/20/2010 showed  apparent new soft tissue ulceration distal to the third metatarsal status post prior resection of the third toe. No soft tissue abscess was demonstrated. There was interval resolution of the previously demonstrated marrow edema within the third metatarsal. No evidence for osteomyelitis. There was stable mid foot degenerative changes and chronic myopathic changes.  3. CT of the cervical spine without IV contrast on 10/21/2010 showed  degenerative changes in  the cervical spine. No displaced fractures  were identified.  4. CT of the head without IV contrast on 10/21/2010 showed no evidence  for acute intracranial hemorrhage, mass lesion or acute infarct. There was some mild diffuse atrophy. There was a subcutaneous hematoma over the left anterior frontal/periorbital region. No underlying skull fracture or acute intracranial abnormality.  5. Chest x-ray, 2 view, from 04/28/2011 showed stable moderate  cardiomegaly with no active lung disease. There was probable mild  scarring at the lung bases.   ASSESSMENT: [name] 81 y.o. female with a history of multiple comorbidities, including anemia,  IgA MGUS, renal failure, B12 deficiency, etc.  1. Anemia of chronic disease and MGUS, rule out MM  --her anemia has been getting slightly worse over the past year, despite Arenasp injection every month. She required a blood transfusion 2 months ago. -Given her history of IgA MGUS, renal failure, and worsening anemia, multiple myeloma needs to be ruled out. Her previous M protein level was low, IgA and light chain levels are elevated. -Her Skeletal survey was normal.  -I have discussed bone marrow biopsy several times and reviewed again today, and she repeatedly declined due to the concern of complications from procedure such as pain.  -her M-protein has been remain to be low and stable overall, will continue monitoring  -Repeated on study and the B12 was normal in [place] 2017 -Increase Aranesp from 200 to 379mg injection monthly, she missed a dose last month and anemia is worse today. Hb 8.0 I encouraged her to keep her monthly injection appointments  2. Right front lobe CVA in 09/2014 -likely related to her AF without anticoagulation .  -She is recovering slowly.  -She is on eliquis now   4. Hypertension, diabetes, A. Fib, CKD, and other medical problems -She will continue follow-up with her primary care physician. She is on Eliquis now for A. Fib  All questions were answered. The patient knows to call the clinic with any problems, questions or concerns. We can certainly see the patient much sooner if necessary.  Plan -Continue Aranesp injection monthly, increase dose to 3067m  -I'll see her back in 3 months   I spent 20 minutes counseling th between-year-old I think that she ise patient face to face. The total time spent in the appointment was 25 minutes.    FeTruitt MerleMD 12/07/2015

## 2015-12-13 LAB — METHYLMALONIC ACID, SERUM: METHYLMALONIC ACID: 311 nmol/L (ref 0–378)

## 2015-12-29 ENCOUNTER — Other Ambulatory Visit: Payer: Self-pay | Admitting: Internal Medicine

## 2016-01-03 ENCOUNTER — Telehealth: Payer: Self-pay | Admitting: Hematology

## 2016-01-03 NOTE — Telephone Encounter (Signed)
8/18 Appointment rescheduled per patient request to 8/22 due to availability of transportation.

## 2016-01-04 ENCOUNTER — Other Ambulatory Visit: Payer: Self-pay

## 2016-01-04 ENCOUNTER — Ambulatory Visit: Payer: Self-pay

## 2016-01-08 ENCOUNTER — Other Ambulatory Visit (HOSPITAL_BASED_OUTPATIENT_CLINIC_OR_DEPARTMENT_OTHER): Payer: Medicare Other

## 2016-01-08 ENCOUNTER — Ambulatory Visit (HOSPITAL_BASED_OUTPATIENT_CLINIC_OR_DEPARTMENT_OTHER): Payer: Medicare Other

## 2016-01-08 VITALS — BP 128/65 | HR 100 | Temp 98.2°F

## 2016-01-08 DIAGNOSIS — D631 Anemia in chronic kidney disease: Secondary | ICD-10-CM

## 2016-01-08 DIAGNOSIS — N183 Chronic kidney disease, stage 3 (moderate): Secondary | ICD-10-CM

## 2016-01-08 DIAGNOSIS — D472 Monoclonal gammopathy: Secondary | ICD-10-CM

## 2016-01-08 DIAGNOSIS — N184 Chronic kidney disease, stage 4 (severe): Secondary | ICD-10-CM

## 2016-01-08 LAB — COMPREHENSIVE METABOLIC PANEL
ALBUMIN: 3.2 g/dL — AB (ref 3.5–5.0)
ALK PHOS: 119 U/L (ref 40–150)
AST: 16 U/L (ref 5–34)
Anion Gap: 13 mEq/L — ABNORMAL HIGH (ref 3–11)
BILIRUBIN TOTAL: 0.88 mg/dL (ref 0.20–1.20)
BUN: 44.8 mg/dL — AB (ref 7.0–26.0)
CALCIUM: 8.6 mg/dL (ref 8.4–10.4)
CO2: 14 mEq/L — ABNORMAL LOW (ref 22–29)
CREATININE: 3.6 mg/dL — AB (ref 0.6–1.1)
Chloride: 114 mEq/L — ABNORMAL HIGH (ref 98–109)
EGFR: 13 mL/min/{1.73_m2} — ABNORMAL LOW (ref >90)
Glucose: 115 mg/dl (ref 70–140)
Potassium: 5.1 mEq/L (ref 3.5–5.1)
Sodium: 141 mEq/L (ref 136–145)
TOTAL PROTEIN: 7.6 g/dL (ref 6.4–8.3)

## 2016-01-08 LAB — CBC WITH DIFFERENTIAL/PLATELET
BASO%: 0.4 % (ref 0.0–2.0)
BASOS ABS: 0 10*3/uL (ref 0.0–0.1)
EOS%: 0.9 % (ref 0.0–7.0)
Eosinophils Absolute: 0.1 10*3/uL (ref 0.0–0.5)
HEMATOCRIT: 29.1 % — AB (ref 34.8–46.6)
HEMOGLOBIN: 8.8 g/dL — AB (ref 11.6–15.9)
LYMPH#: 1 10*3/uL (ref 0.9–3.3)
LYMPH%: 17.1 % (ref 14.0–49.7)
MCH: 23.6 pg — ABNORMAL LOW (ref 25.1–34.0)
MCHC: 30.2 g/dL — ABNORMAL LOW (ref 31.5–36.0)
MCV: 78 fL — ABNORMAL LOW (ref 79.5–101.0)
MONO#: 0.8 10*3/uL (ref 0.1–0.9)
MONO%: 12.5 % (ref 0.0–14.0)
NEUT#: 4.2 10*3/uL (ref 1.5–6.5)
NEUT%: 69.1 % (ref 38.4–76.8)
PLATELETS: 184 10*3/uL (ref 145–400)
RBC: 3.73 10*6/uL (ref 3.70–5.45)
RDW: 21.4 % — AB (ref 11.2–14.5)
WBC: 6 10*3/uL (ref 3.9–10.3)

## 2016-01-08 LAB — IRON AND TIBC
%SAT: 7 % — AB (ref 21–57)
Iron: 22 ug/dL — ABNORMAL LOW (ref 41–142)
TIBC: 300 ug/dL (ref 236–444)
UIBC: 278 ug/dL (ref 120–384)

## 2016-01-08 LAB — FERRITIN: Ferritin: 60 ng/ml (ref 9–269)

## 2016-01-08 MED ORDER — DARBEPOETIN ALFA 300 MCG/0.6ML IJ SOSY
300.0000 ug | PREFILLED_SYRINGE | Freq: Once | INTRAMUSCULAR | Status: AC
  Administered 2016-01-08: 300 ug via SUBCUTANEOUS
  Filled 2016-01-08: qty 0.6

## 2016-01-08 NOTE — Patient Instructions (Signed)
Darbepoetin Alfa injection What is this medicine? DARBEPOETIN ALFA (dar be POE e tin AL fa) helps your body make more red blood cells. It is used to treat anemia caused by chronic kidney failure and chemotherapy. This medicine may be used for other purposes; ask your health care provider or pharmacist if you have questions. What should I tell my health care provider before I take this medicine? They need to know if you have any of these conditions: -blood clotting disorders or history of blood clots -cancer patient not on chemotherapy -cystic fibrosis -heart disease, such as angina, heart failure, or a history of a heart attack -hemoglobin level of 12 g/dL or greater -high blood pressure -low levels of folate, iron, or vitamin B12 -seizures -an unusual or allergic reaction to darbepoetin, erythropoietin, albumin, hamster proteins, latex, other medicines, foods, dyes, or preservatives -pregnant or trying to get pregnant -breast-feeding How should I use this medicine? This medicine is for injection into a vein or under the skin. It is usually given by a health care professional in a hospital or clinic setting. If you get this medicine at home, you will be taught how to prepare and give this medicine. Do not shake the solution before you withdraw a dose. Use exactly as directed. Take your medicine at regular intervals. Do not take your medicine more often than directed. It is important that you put your used needles and syringes in a special sharps container. Do not put them in a trash can. If you do not have a sharps container, call your pharmacist or healthcare provider to get one. Talk to your pediatrician regarding the use of this medicine in children. While this medicine may be used in children as [place] as 1 year for selected conditions, precautions do apply. Overdosage: If you think you have taken too much of this medicine contact a poison control center or emergency room at once. NOTE:  This medicine is only for you. Do not share this medicine with others. What if I miss a dose? If you miss a dose, take it as soon as you can. If it is almost time for your next dose, take only that dose. Do not take double or extra doses. What may interact with this medicine? Do not take this medicine with any of the following medications: -epoetin alfa This list may not describe all possible interactions. Give your health care provider a list of all the medicines, herbs, non-prescription drugs, or dietary supplements you use. Also tell them if you smoke, drink alcohol, or use illegal drugs. Some items may interact with your medicine. What should I watch for while using this medicine? Visit your prescriber or health care professional for regular checks on your progress and for the needed blood tests and blood pressure measurements. It is especially important for the doctor to make sure your hemoglobin level is in the desired range, to limit the risk of potential side effects and to give you the best benefit. Keep all appointments for any recommended tests. Check your blood pressure as directed. Ask your doctor what your blood pressure should be and when you should contact him or her. As your body makes more red blood cells, you may need to take iron, folic acid, or vitamin B supplements. Ask your doctor or health care provider which products are right for you. If you have kidney disease continue dietary restrictions, even though this medication can make you feel better. Talk with your doctor or health care professional about the   foods you eat and the vitamins that you take. What side effects may I notice from receiving this medicine? Side effects that you should report to your doctor or health care professional as soon as possible: -allergic reactions like skin rash, itching or hives, swelling of the face, lips, or tongue -breathing problems -changes in vision -chest pain -confusion, trouble speaking  or understanding -feeling faint or lightheaded, falls -high blood pressure -muscle aches or pains -pain, swelling, warmth in the leg -rapid weight gain -severe headaches -sudden numbness or weakness of the face, arm or leg -trouble walking, dizziness, loss of balance or coordination -seizures (convulsions) -swelling of the ankles, feet, hands -unusually weak or tired Side effects that usually do not require medical attention (report to your doctor or health care professional if they continue or are bothersome): -diarrhea -fever, chills (flu-like symptoms) -headaches -nausea, vomiting -redness, stinging, or swelling at site where injected This list may not describe all possible side effects. Call your doctor for medical advice about side effects. You may report side effects to FDA at 1-800-FDA-1088. Where should I keep my medicine? Keep out of the reach of children. Store in a refrigerator between 2 and 8 degrees C (36 and 46 degrees F). Do not freeze. Do not shake. Throw away any unused portion if using a single-dose vial. Throw away any unused medicine after the expiration date. NOTE: This sheet is a summary. It may not cover all possible information. If you have questions about this medicine, talk to your doctor, pharmacist, or health care provider.    2016, Elsevier/Gold Standard. (2008-04-18 10:23:57)  

## 2016-01-09 LAB — KAPPA/LAMBDA LIGHT CHAINS
IG KAPPA FREE LIGHT CHAIN: 524.4 mg/L — AB (ref 3.3–19.4)
Ig Lambda Free Light Chain: 67.7 mg/L — ABNORMAL HIGH (ref 5.7–26.3)
KAPPA/LAMBDA FLC RATIO: 7.75 — AB (ref 0.26–1.65)

## 2016-01-11 LAB — MULTIPLE MYELOMA PANEL, SERUM
Albumin SerPl Elph-Mcnc: 3.6 g/dL (ref 2.9–4.4)
Albumin/Glob SerPl: 1.1 (ref 0.7–1.7)
Alpha 1: 0.3 g/dL (ref 0.0–0.4)
Alpha2 Glob SerPl Elph-Mcnc: 0.8 g/dL (ref 0.4–1.0)
B-Globulin SerPl Elph-Mcnc: 1.1 g/dL (ref 0.7–1.3)
Gamma Glob SerPl Elph-Mcnc: 1.3 g/dL (ref 0.4–1.8)
Globulin, Total: 3.6 g/dL (ref 2.2–3.9)
IgA, Qn, Serum: 609 mg/dL — ABNORMAL HIGH (ref 64–422)
IgG, Qn, Serum: 1325 mg/dL (ref 700–1600)
IgM, Qn, Serum: 56 mg/dL (ref 26–217)
M Protein SerPl Elph-Mcnc: 0.4 g/dL — ABNORMAL HIGH
Total Protein: 7.2 g/dL (ref 6.0–8.5)

## 2016-01-17 ENCOUNTER — Emergency Department (HOSPITAL_COMMUNITY): Payer: Medicare Other

## 2016-01-17 ENCOUNTER — Encounter (HOSPITAL_COMMUNITY): Payer: Self-pay | Admitting: Emergency Medicine

## 2016-01-17 ENCOUNTER — Inpatient Hospital Stay (HOSPITAL_COMMUNITY)
Admission: EM | Admit: 2016-01-17 | Discharge: 2016-01-25 | DRG: 682 | Disposition: A | Discharge status: Skilled Nursing Facility | Payer: Medicare Other | Attending: Student in an Organized Health Care Education/Training Program | Admitting: Student in an Organized Health Care Education/Training Program

## 2016-01-17 DIAGNOSIS — E875 Hyperkalemia: Secondary | ICD-10-CM | POA: Diagnosis not present

## 2016-01-17 DIAGNOSIS — I350 Nonrheumatic aortic (valve) stenosis: Secondary | ICD-10-CM | POA: Diagnosis present

## 2016-01-17 DIAGNOSIS — I132 Hypertensive heart and chronic kidney disease with heart failure and with stage 5 chronic kidney disease, or end stage renal disease: Secondary | ICD-10-CM | POA: Diagnosis not present

## 2016-01-17 DIAGNOSIS — I481 Persistent atrial fibrillation: Secondary | ICD-10-CM | POA: Diagnosis not present

## 2016-01-17 DIAGNOSIS — I509 Heart failure, unspecified: Secondary | ICD-10-CM | POA: Diagnosis not present

## 2016-01-17 DIAGNOSIS — E1140 Type 2 diabetes mellitus with diabetic neuropathy, unspecified: Secondary | ICD-10-CM | POA: Diagnosis not present

## 2016-01-17 DIAGNOSIS — Z8673 Personal history of transient ischemic attack (TIA), and cerebral infarction without residual deficits: Secondary | ICD-10-CM | POA: Diagnosis not present

## 2016-01-17 DIAGNOSIS — E872 Acidosis: Secondary | ICD-10-CM | POA: Diagnosis not present

## 2016-01-17 DIAGNOSIS — N179 Acute kidney failure, unspecified: Secondary | ICD-10-CM | POA: Diagnosis present

## 2016-01-17 DIAGNOSIS — Z7189 Other specified counseling: Secondary | ICD-10-CM | POA: Diagnosis not present

## 2016-01-17 DIAGNOSIS — I5023 Acute on chronic systolic (congestive) heart failure: Secondary | ICD-10-CM | POA: Diagnosis not present

## 2016-01-17 DIAGNOSIS — R609 Edema, unspecified: Secondary | ICD-10-CM | POA: Diagnosis not present

## 2016-01-17 DIAGNOSIS — D259 Leiomyoma of uterus, unspecified: Secondary | ICD-10-CM | POA: Diagnosis not present

## 2016-01-17 DIAGNOSIS — Z7901 Long term (current) use of anticoagulants: Secondary | ICD-10-CM | POA: Diagnosis not present

## 2016-01-17 DIAGNOSIS — I5021 Acute systolic (congestive) heart failure: Secondary | ICD-10-CM

## 2016-01-17 DIAGNOSIS — N289 Disorder of kidney and ureter, unspecified: Secondary | ICD-10-CM

## 2016-01-17 DIAGNOSIS — I1310 Hypertensive heart and chronic kidney disease without heart failure, with stage 1 through stage 4 chronic kidney disease, or unspecified chronic kidney disease: Secondary | ICD-10-CM

## 2016-01-17 DIAGNOSIS — D472 Monoclonal gammopathy: Secondary | ICD-10-CM | POA: Diagnosis not present

## 2016-01-17 DIAGNOSIS — I482 Chronic atrial fibrillation, unspecified: Secondary | ICD-10-CM

## 2016-01-17 DIAGNOSIS — R600 Localized edema: Secondary | ICD-10-CM | POA: Diagnosis not present

## 2016-01-17 DIAGNOSIS — Z79899 Other long term (current) drug therapy: Secondary | ICD-10-CM

## 2016-01-17 DIAGNOSIS — E039 Hypothyroidism, unspecified: Secondary | ICD-10-CM | POA: Diagnosis not present

## 2016-01-17 DIAGNOSIS — Z66 Do not resuscitate: Secondary | ICD-10-CM | POA: Diagnosis not present

## 2016-01-17 DIAGNOSIS — I959 Hypotension, unspecified: Secondary | ICD-10-CM | POA: Diagnosis present

## 2016-01-17 DIAGNOSIS — K644 Residual hemorrhoidal skin tags: Secondary | ICD-10-CM | POA: Diagnosis not present

## 2016-01-17 DIAGNOSIS — R197 Diarrhea, unspecified: Secondary | ICD-10-CM | POA: Diagnosis not present

## 2016-01-17 DIAGNOSIS — Z9119 Patient's noncompliance with other medical treatment and regimen: Secondary | ICD-10-CM | POA: Diagnosis not present

## 2016-01-17 DIAGNOSIS — E1122 Type 2 diabetes mellitus with diabetic chronic kidney disease: Secondary | ICD-10-CM | POA: Diagnosis not present

## 2016-01-17 DIAGNOSIS — I130 Hypertensive heart and chronic kidney disease with heart failure and stage 1 through stage 4 chronic kidney disease, or unspecified chronic kidney disease: Secondary | ICD-10-CM | POA: Diagnosis not present

## 2016-01-17 DIAGNOSIS — D631 Anemia in chronic kidney disease: Secondary | ICD-10-CM | POA: Diagnosis present

## 2016-01-17 DIAGNOSIS — E785 Hyperlipidemia, unspecified: Secondary | ICD-10-CM | POA: Diagnosis not present

## 2016-01-17 DIAGNOSIS — Z7984 Long term (current) use of oral hypoglycemic drugs: Secondary | ICD-10-CM | POA: Diagnosis not present

## 2016-01-17 DIAGNOSIS — R32 Unspecified urinary incontinence: Secondary | ICD-10-CM | POA: Diagnosis present

## 2016-01-17 DIAGNOSIS — Z515 Encounter for palliative care: Secondary | ICD-10-CM

## 2016-01-17 DIAGNOSIS — Z89432 Acquired absence of left foot: Secondary | ICD-10-CM | POA: Diagnosis not present

## 2016-01-17 DIAGNOSIS — N186 End stage renal disease: Secondary | ICD-10-CM | POA: Diagnosis present

## 2016-01-17 DIAGNOSIS — I129 Hypertensive chronic kidney disease with stage 1 through stage 4 chronic kidney disease, or unspecified chronic kidney disease: Secondary | ICD-10-CM | POA: Diagnosis not present

## 2016-01-17 DIAGNOSIS — N189 Chronic kidney disease, unspecified: Secondary | ICD-10-CM | POA: Diagnosis not present

## 2016-01-17 DIAGNOSIS — E8770 Fluid overload, unspecified: Secondary | ICD-10-CM | POA: Diagnosis not present

## 2016-01-17 DIAGNOSIS — K6289 Other specified diseases of anus and rectum: Secondary | ICD-10-CM | POA: Diagnosis not present

## 2016-01-17 DIAGNOSIS — I872 Venous insufficiency (chronic) (peripheral): Secondary | ICD-10-CM | POA: Diagnosis present

## 2016-01-17 DIAGNOSIS — J45909 Unspecified asthma, uncomplicated: Secondary | ICD-10-CM | POA: Diagnosis present

## 2016-01-17 DIAGNOSIS — I4891 Unspecified atrial fibrillation: Secondary | ICD-10-CM | POA: Diagnosis present

## 2016-01-17 DIAGNOSIS — Z89431 Acquired absence of right foot: Secondary | ICD-10-CM

## 2016-01-17 LAB — URINALYSIS, ROUTINE W REFLEX MICROSCOPIC
Glucose, UA: NEGATIVE mg/dL
Hgb urine dipstick: NEGATIVE
KETONES UR: NEGATIVE mg/dL
NITRITE: NEGATIVE
PH: 5 (ref 5.0–8.0)
PROTEIN: 30 mg/dL — AB
Specific Gravity, Urine: 1.018 (ref 1.005–1.030)

## 2016-01-17 LAB — COMPREHENSIVE METABOLIC PANEL
ALBUMIN: 3.5 g/dL (ref 3.5–5.0)
ALK PHOS: 117 U/L (ref 38–126)
ALT: 18 U/L (ref 14–54)
ANION GAP: 12 (ref 5–15)
AST: 36 U/L (ref 15–41)
BILIRUBIN TOTAL: 1.6 mg/dL — AB (ref 0.3–1.2)
BUN: 69 mg/dL — ABNORMAL HIGH (ref 6–20)
CALCIUM: 8.3 mg/dL — AB (ref 8.9–10.3)
CO2: 14 mmol/L — ABNORMAL LOW (ref 22–32)
Chloride: 113 mmol/L — ABNORMAL HIGH (ref 101–111)
Creatinine, Ser: 5.36 mg/dL — ABNORMAL HIGH (ref 0.44–1.00)
GFR, EST AFRICAN AMERICAN: 8 mL/min — AB (ref >60)
GFR, EST NON AFRICAN AMERICAN: 7 mL/min — AB (ref >60)
GLUCOSE: 175 mg/dL — AB (ref 65–99)
POTASSIUM: 5.1 mmol/L (ref 3.5–5.1)
Sodium: 139 mmol/L (ref 135–145)
TOTAL PROTEIN: 7.2 g/dL (ref 6.5–8.1)

## 2016-01-17 LAB — CBC WITH DIFFERENTIAL/PLATELET
BASOS PCT: 0 %
Basophils Absolute: 0 10*3/uL (ref 0.0–0.1)
Eosinophils Absolute: 0 10*3/uL (ref 0.0–0.7)
Eosinophils Relative: 0 %
HEMATOCRIT: 30.5 % — AB (ref 36.0–46.0)
HEMOGLOBIN: 9.2 g/dL — AB (ref 12.0–15.0)
LYMPHS ABS: 0.5 10*3/uL — AB (ref 0.7–4.0)
LYMPHS PCT: 6 %
MCH: 23.5 pg — AB (ref 26.0–34.0)
MCHC: 30.2 g/dL (ref 30.0–36.0)
MCV: 78 fL (ref 78.0–100.0)
MONO ABS: 0.9 10*3/uL (ref 0.1–1.0)
MONOS PCT: 10 %
NEUTROS ABS: 7.8 10*3/uL — AB (ref 1.7–7.7)
NEUTROS PCT: 84 %
Platelets: 253 10*3/uL (ref 150–400)
RBC: 3.91 MIL/uL (ref 3.87–5.11)
RDW: 20.1 % — AB (ref 11.5–15.5)
WBC: 9.2 10*3/uL (ref 4.0–10.5)

## 2016-01-17 LAB — I-STAT TROPONIN, ED: TROPONIN I, POC: 0.02 ng/mL (ref 0.00–0.08)

## 2016-01-17 LAB — LIPASE, BLOOD: LIPASE: 65 U/L — AB (ref 11–51)

## 2016-01-17 LAB — I-STAT CG4 LACTIC ACID, ED: LACTIC ACID, VENOUS: 1.43 mmol/L (ref 0.5–1.9)

## 2016-01-17 LAB — URINE MICROSCOPIC-ADD ON

## 2016-01-17 LAB — POC OCCULT BLOOD, ED: Fecal Occult Bld: NEGATIVE

## 2016-01-17 LAB — BRAIN NATRIURETIC PEPTIDE: B Natriuretic Peptide: 238.3 pg/mL — ABNORMAL HIGH (ref 0.0–100.0)

## 2016-01-17 MED ORDER — SODIUM CHLORIDE 0.9 % IV BOLUS (SEPSIS)
1000.0000 mL | Freq: Once | INTRAVENOUS | Status: AC
  Administered 2016-01-17: 1000 mL via INTRAVENOUS

## 2016-01-17 NOTE — ED Notes (Signed)
Pt was unable to stand to finish orthostatic vital signs. Rn Raquel Sarna is aware.

## 2016-01-17 NOTE — ED Provider Notes (Signed)
[facility] DEPT Provider Note   CSN: [id number] Arrival date & time: 01/17/16  1754     History   Chief Complaint No chief complaint on file.   HPI [name] is a 81 y.o. female.  Pt presents from home via EMS with rectal pain for 1 day.  She has had increased pain with BM and with sitting.  The pt said her BMs have been darker than normal.  She denies constipation.  The pt also c/o swelling to her legs that have been so swollen that she's not been able to move.  Pt normally ambulates with a walker. Pt also has a hx of anemia with MGUS that may be converting to MM, but pt has refused biopsy.  She has had to receive blood transfusions for her symptomatic anemia.  Pt said that she has been unable to get up and walk.      Past Medical History:  Diagnosis Date  . Anemia    b12 def, iron def, follow at cancer center, gets B12 and  another injection there. Could not remeber the name. Dr. Ralene Ok is her  cancer doctor  . Asthma   . Atrial fibrillation ([place])   . Blood transfusion    two or more yrs ago  . Chronic renal insufficiency   . Chronic venous insufficiency   . Diabetes mellitus   . Hyperlipidemia   . Hypertension   . Hypothyroidism    thyroid removed 4 or more yrs ago  . MGUS (monoclonal gammopathy of unknown significance)   . Osteomyelitis ([place])    s/p Rt 2nd toe and left 5 toes  amputation in 1/12  by Dr. Sharol Given  . Stroke ([place])   . Thyroid disease    hypothyroidism h/o hyperthyroidism s/p ablation/ectomy    Patient Active Problem List   Diagnosis Date Noted  . Papular rash 09/19/2015  . Ulcer of left lower extremity ([place]) 09/19/2015  . Benign neoplasm of transverse colon 12/17/2014  . Gastritis   . Melena 12/12/2014  . UGI bleed 12/12/2014  . Absolute anemia   . CVA (cerebral vascular accident) ([place]) 10/01/2014  . TIA (transient ischemic attack) 09/30/2014  . Arthralgia of both hands 08/04/2014  . Preventative health care 05/03/2014  . Facet  arthropathy of spine 04/26/2014  . Vitamin D deficiency 11/03/2012  . External hemorrhoids 06/28/2012  . Intermittent sleepiness 06/28/2012  . Anemia associated with chronic renal failure 04/03/2011  . MGUS (monoclonal gammopathy of unknown significance) 04/03/2011  . Atrial fibrillation ([place]) 01/24/2011  . History of osteomyelitis 09/05/2009  . Hypothyroidism 02/01/2007  . Diabetes mellitus with neuropathy ([place]) 07/03/2006  . CKD (chronic kidney disease) stage 4, GFR 15-29 ml/min (HCC) 07/03/2006  . Hyperlipidemia 03/02/2006  . Essential hypertension 03/02/2006  . VENOUS INSUFFICIENCY 03/02/2006  . EDEMA LEG 03/02/2006  . History of amputation of lesser toe ([place]) 03/02/2006    Past Surgical History:  Procedure Laterality Date  . AMPUTATION  04/30/2011   Procedure: AMPUTATION FOOT;  Surgeon: Newt Minion, MD;  Location: [facility];  Service: Orthopedics;  Laterality: Right;  Right Midfoot Amputation  . APPENDECTOMY     teenager  . COLONOSCOPY N/A 12/17/2014   Procedure: COLONOSCOPY;  Surgeon: Ladene Artist, MD;  Location[facility] END[facility]COPY;  Service: Endoscopy;  Laterality: N/A;  . ESOPHAGOGASTRODUODENOSCOPY N/A 12/15/2014   Procedure: ESOPHAGOGASTRODUODENOSCOPY (EGD);  Surgeon: Ladene Artist, MD;  Location: [facility] ENDOSCOPY;  Service: Endoscopy;  Laterality: N/A;  . EYE SURGERY  cat ext ou    OB History    No data available       Home Medications    Prior to Admission medications   Medication Sig Start Date End Date Taking? Authorizing Provider  atorvastatin (LIPITOR) 40 MG tablet TAKE 1 TABLET BY MOUTH EVERY DAY 08/01/15  Yes Bartholomew Crews, MD  calcitRIOL (ROCALTROL) 0.25 MCG capsule Take 1 capsule by mouth every Monday, Wednesday, and Friday. 04/10/14  Yes Historical Provider, MD  cetirizine (ZYRTEC) 10 MG chewable tablet Chew 1 tablet (10 mg total) by mouth daily. Patient taking differently: Chew 10 mg by mouth daily as needed for allergies.  09/19/15  Yes Carly J Rivet,  MD  ELIQUIS 5 MG TABS tablet TAKE 1 TABLET(5 MG) BY MOUTH TWICE DAILY 06/15/15  Yes [name], MD  furosemide (LASIX) 20 MG tablet TAKE 1 TABLET(20 MG) BY MOUTH DAILY Patient taking differently: TAKE 1 TABLET(20 MG) BY MOUTH TWICE DAILY. 08/24/15  Yes [name], MD  glipiZIDE (GLUCOTROL) 10 MG tablet Take 0.5 tablets (5 mg total) by mouth 2 (two) times daily with a meal. 08/29/15  Yes [name], MD  JANUVIA 25 MG tablet TAKE 1 TABLET BY MOUTH EVERY DAY 09/05/15  Yes [name], MD  levothyroxine (SYNTHROID, LEVOTHROID) 112 MCG tablet TAKE 1 TABLET(112 MCG) BY MOUTH DAILY BEFORE BREAKFAST 10/24/15  Yes [name], MD  lisinopril (PRINIVIL,ZESTRIL) 40 MG tablet TAKE 1 TABLET BY MOUTH EVERY DAY 12/31/15  Yes [name], MD  metoprolol tartrate (LOPRESSOR) 25 MG tablet Take 1 tablet (25 mg total) by mouth 2 (two) times daily. 09/03/15  Yes Oval Linsey, MD  pantoprazole (PROTONIX) 40 MG tablet TAKE 1 TABLET BY MOUTH EVERY DAY 07/07/15  Yes [name], MD  polyethylene glycol (MIRALAX / GLYCOLAX) packet Take 17 g by mouth daily as needed for mild constipation or moderate constipation.    Yes Historical Provider, MD  sennosides-docusate sodium (SENOKOT-S) 8.6-50 MG tablet Take 2 tablets by mouth daily as needed for constipation.    Yes Historical Provider, MD  UNABLE TO FIND Inject 1 each into the skin every 28 (twenty-eight) days. Vitamin D injection monthly at the cancer center.   Yes Historical Provider, MD  Blood Glucose Monitoring Suppl (ONETOUCH VERIO FLEX SYSTEM) W/DEVICE KIT 1 each by Does not apply route every morning. Check blood sugar one time each day 05/07/15   [name], MD  Cholecalciferol (VITAMIN D3) 10000 UNITS capsule Take 1 capsule (10,000 Units total) by mouth once a week. Patient not taking: Reported on 01/17/2016 08/04/14   [name], MD  clotrimazole (LOTRIMIN) 1 % cream Apply 1 application topically 2 (two) times daily. Patient not taking: Reported  on 01/17/2016 09/19/15   Sindy Guadeloupe Rivet, MD  glucose blood (ONETOUCH VERIO) test strip Check blood sugar one time each day 05/07/15   [name], MD  [facility] LANCETS FINE MISC Check blood sugar one time each day 05/07/15   [name], MD    Family History Family History  Problem Relation Age of Onset  . Anemia Mother   . Colon cancer Mother     rectal  . HIV Brother   . Cancer Brother     Social History Social History  Substance Use Topics  . Smoking status: Never Smoker  . Smokeless tobacco: Never Used  . Alcohol use No     Allergies   Review of patient's allergies indicates no  known allergies.   Review of Systems Review of Systems  Gastrointestinal: Positive for rectal pain.  Musculoskeletal:       Leg swelling  All other systems reviewed and are negative.    Physical Exam Updated Vital Signs BP 102/61 (BP Location: Left Arm)   Pulse 80   Temp 97.8 F (36.6 C) (Oral)   Resp 15   SpO2 95%   Physical Exam  Constitutional: She is oriented to person, place, and time. She appears well-developed and well-nourished.  HENT:  Head: Normocephalic and atraumatic.  Right Ear: External ear normal.  Left Ear: External ear normal.  Nose: Nose normal.  Mouth/Throat: Oropharynx is clear and moist.  Eyes: Conjunctivae and EOM are normal. Pupils are equal, round, and reactive to light.  Neck: Normal range of motion. Neck supple.  Cardiovascular: Normal rate, regular rhythm, normal heart sounds and intact distal pulses.   Pulmonary/Chest: Effort normal and breath sounds normal.  Abdominal: Soft. Bowel sounds are normal.  Genitourinary:  Genitourinary Comments: Rectal pain.  No impaction.  Musculoskeletal: She exhibits edema.  Neurological: She is alert and oriented to person, place, and time.  Skin: Skin is warm and dry.  Psychiatric: She has a normal mood and affect. Her behavior is normal. Judgment and thought content normal.  Nursing note and vitals  reviewed.    ED Treatments / Results  Labs (all labs ordered are listed, but only abnormal results are displayed) Labs Reviewed  CBC WITH DIFFERENTIAL/PLATELET - Abnormal; Notable for the following:       Result Value   Hemoglobin 9.2 (*)    HCT 30.5 (*)    MCH 23.5 (*)    RDW 20.1 (*)    Neutro Abs 7.8 (*)    Lymphs Abs 0.5 (*)    All other components within normal limits  COMPREHENSIVE METABOLIC PANEL - Abnormal; Notable for the following:    Chloride 113 (*)    CO2 14 (*)    Glucose, Bld 175 (*)    BUN 69 (*)    Creatinine, Ser 5.36 (*)    Calcium 8.3 (*)    Total Bilirubin 1.6 (*)    GFR calc non Af Amer 7 (*)    GFR calc Af Amer 8 (*)    All other components within normal limits  LIPASE, BLOOD - Abnormal; Notable for the following:    Lipase 65 (*)    All other components within normal limits  URINALYSIS, ROUTINE W REFLEX MICROSCOPIC (NOT AT [facility]) - Abnormal; Notable for the following:    Color, Urine AMBER (*)    Bilirubin Urine MODERATE (*)    Protein, ur 30 (*)    Leukocytes, UA SMALL (*)    All other components within normal limits  BRAIN NATRIURETIC PEPTIDE - Abnormal; Notable for the following:    B Natriuretic Peptide 238.3 (*)    All other components within normal limits  URINE MICROSCOPIC-ADD ON - Abnormal; Notable for the following:    Squamous Epithelial / LPF 0-5 (*)    Bacteria, UA FEW (*)    All other components within normal limits  POC OCCULT BLOOD, ED  I-STAT CG4 LACTIC ACID, ED  I-STAT TROPOININ, ED    EKG  EKG Interpretation None       Radiology Ct Abdomen Pelvis Wo Contrast  Result Date: 01/17/2016 CLINICAL DATA:  Rectal pain for 1 day, worse with sitting. EXAM: CT ABDOMEN AND PELVIS WITHOUT CONTRAST TECHNIQUE: Multidetector CT imaging of the abdomen and  pelvis was performed following the standard protocol without IV contrast. COMPARISON:  Ultrasound 02/01/2013 FINDINGS: There is hyperdense layering material within the gallbladder  lumen, possibly milk of calcium. Discrete calculi are not visible. No bile duct dilatation. Unremarkable unenhanced appearances of the liver. There are unremarkable unenhanced appearances of the pancreas, spleen and adrenals. Kidneys appear mildly atrophic but are otherwise unremarkable. Collecting systems and ureters are unremarkable. Urinary bladder exhibits mild uniform mural thickening which could represent cystitis although it may also be related to the incomplete urinary bladder distention. There are unremarkable appearances of the stomach, small bowel and colon except for mild uncomplicated diverticulosis. The normal caliber aorta is heavily calcified. There are multiple calcified uterine fibroids measuring up to about 2.7 cm. No adnexal mass is evident. No focal acute inflammatory changes are evident in the abdomen or pelvis. There is a small volume ascites collected in the left adnexal region. There is a fat containing umbilical hernia In the lower chest, there is a moderate left pleural effusion which is incompletely imaged. There are posterior lung base opacities which are incompletely imaged but more likely represent benign scarring or atelectasis. No significant skeletal lesion is evident. IMPRESSION: 1. No acute inflammatory changes are evident in the abdomen or pelvis. No bowel obstruction or perforation. No perianal or perirectal abscess to account for the rectal pain. 2. Multiple calcified uterine fibroids measuring up to about 2.7 cm. 3. Hyperdense layering material within the gallbladder lumen, possibly milk of calcium bile. 4. Fat containing umbilical hernia. 5. Moderate left pleural effusion, incompletely imaged. Electronically Signed   By: Andreas Newport M.D.   On: 01/17/2016 21:25   Dg Abdomen Acute W/chest  Result Date: 01/17/2016 CLINICAL DATA:  Rectal pain for 1 day. EXAM: DG ABDOMEN ACUTE W/ 1V CHEST COMPARISON:  December 12, 2014 FINDINGS: There is no evidence of dilated bowel loops or  free intraperitoneal air. No radiopaque calculi or other significant radiographic abnormality is seen. Degenerative joint changes of the spine and scoliosis of the spine are identified. Images of the lungs demonstrate patchy consolidation of left lung base. There bilateral pleural effusions. There is increased central pulmonary vessel caliber. The heart size is enlarged. IMPRESSION: No bowel obstruction or free air. Bilateral pleural effusions. Patchy consolidation of left lung base, underlying pneumonia is not excluded. Central pulmonary vascular congestion with cardiomegaly. Electronically Signed   By: Abelardo Diesel M.D.   On: 01/17/2016 18:54    Procedures Procedures (including critical care time)  Medications Ordered in ED Medications  sodium chloride 0.9 % bolus 1,000 mL (0 mLs Intravenous Stopped 01/17/16 2255)  sodium chloride 0.9 % bolus 1,000 mL (1,000 mLs Intravenous New Bag/Given 01/17/16 2335)     Initial Impression / Assessment and Plan / ED Course  I have reviewed the triage vital signs and the nursing notes.  Pertinent labs & imaging results that were available during my care of the patient were reviewed by me and considered in my medical decision making (see chart for details).  Clinical Course  Value Comment By Time  DG Abdomen Acute W/Chest (Reviewed) Isla Pence, MD 08/31 2357   Pt is still unable to get up.  BP drops with just sitting.  She has CHF and pedal edema up to her bottom.  She has worsening of her renal insufficiency.  I can't treat her with lasix or her bp will worsen.  At this point, I think she needs to come in for some gentle IVFs.  Pt belongs to the  IM teaching service, so I spoke with the IM resident who will accept pt for transfer to [facility].  Pt understands and agrees with plan.   Final Clinical Impressions(s) / ED Diagnoses   Final diagnoses:  Hypotension, unspecified hypotension type  Diarrhea, unspecified type  Acute on chronic renal insufficiency  (HCC)  Peripheral edema    New Prescriptions New Prescriptions   No medications on file     Isla Pence, MD 01/18/16 0011

## 2016-01-17 NOTE — ED Triage Notes (Signed)
Patient presents from home via ems for rectal pain x1 day, increased pain with sitting and with BM, BM have been darker than normal. Also c/o bilateral knee pain with swelling to same x2 days. Patient is on eliquis. Denies N/V/D. A&O x4.  Last VS: 142/68, 90hr, 20resp, 96%ra, 191cbg.

## 2016-01-17 NOTE — ED Notes (Signed)
Pt is unable to give a urine sample. [place] notified.

## 2016-01-17 NOTE — ED Notes (Signed)
Unable to collect labs patient is not in the room 

## 2016-01-17 NOTE — ED Notes (Signed)
Patient transported to X-ray 

## 2016-01-17 NOTE — ED Notes (Signed)
Bed: WA07 Expected date:  Expected time:  Means of arrival:  Comments: EMS-older person, forgot

## 2016-01-17 NOTE — ED Notes (Signed)
Two unsuccessful IV attempts by this Probation officer. Will get another staff member to attempt.

## 2016-01-18 ENCOUNTER — Encounter (HOSPITAL_COMMUNITY): Payer: Self-pay | Admitting: General Practice

## 2016-01-18 DIAGNOSIS — I132 Hypertensive heart and chronic kidney disease with heart failure and with stage 5 chronic kidney disease, or end stage renal disease: Secondary | ICD-10-CM | POA: Diagnosis present

## 2016-01-18 DIAGNOSIS — Z66 Do not resuscitate: Secondary | ICD-10-CM | POA: Diagnosis not present

## 2016-01-18 DIAGNOSIS — N179 Acute kidney failure, unspecified: Principal | ICD-10-CM | POA: Diagnosis present

## 2016-01-18 DIAGNOSIS — J90 Pleural effusion, not elsewhere classified: Secondary | ICD-10-CM

## 2016-01-18 DIAGNOSIS — N189 Chronic kidney disease, unspecified: Secondary | ICD-10-CM | POA: Diagnosis not present

## 2016-01-18 DIAGNOSIS — Z7901 Long term (current) use of anticoagulants: Secondary | ICD-10-CM | POA: Diagnosis not present

## 2016-01-18 DIAGNOSIS — I509 Heart failure, unspecified: Secondary | ICD-10-CM | POA: Diagnosis not present

## 2016-01-18 DIAGNOSIS — Z9119 Patient's noncompliance with other medical treatment and regimen: Secondary | ICD-10-CM | POA: Diagnosis not present

## 2016-01-18 DIAGNOSIS — Z7189 Other specified counseling: Secondary | ICD-10-CM | POA: Diagnosis not present

## 2016-01-18 DIAGNOSIS — I350 Nonrheumatic aortic (valve) stenosis: Secondary | ICD-10-CM | POA: Diagnosis present

## 2016-01-18 DIAGNOSIS — E1122 Type 2 diabetes mellitus with diabetic chronic kidney disease: Secondary | ICD-10-CM | POA: Diagnosis not present

## 2016-01-18 DIAGNOSIS — E1129 Type 2 diabetes mellitus with other diabetic kidney complication: Secondary | ICD-10-CM | POA: Diagnosis not present

## 2016-01-18 DIAGNOSIS — I5023 Acute on chronic systolic (congestive) heart failure: Secondary | ICD-10-CM | POA: Diagnosis not present

## 2016-01-18 DIAGNOSIS — E039 Hypothyroidism, unspecified: Secondary | ICD-10-CM | POA: Diagnosis not present

## 2016-01-18 DIAGNOSIS — Z9114 Patient's other noncompliance with medication regimen: Secondary | ICD-10-CM

## 2016-01-18 DIAGNOSIS — I129 Hypertensive chronic kidney disease with stage 1 through stage 4 chronic kidney disease, or unspecified chronic kidney disease: Secondary | ICD-10-CM | POA: Diagnosis not present

## 2016-01-18 DIAGNOSIS — I6789 Other cerebrovascular disease: Secondary | ICD-10-CM | POA: Diagnosis not present

## 2016-01-18 DIAGNOSIS — Z5189 Encounter for other specified aftercare: Secondary | ICD-10-CM | POA: Diagnosis not present

## 2016-01-18 DIAGNOSIS — I482 Chronic atrial fibrillation: Secondary | ICD-10-CM

## 2016-01-18 DIAGNOSIS — E785 Hyperlipidemia, unspecified: Secondary | ICD-10-CM

## 2016-01-18 DIAGNOSIS — N186 End stage renal disease: Secondary | ICD-10-CM | POA: Diagnosis present

## 2016-01-18 DIAGNOSIS — E875 Hyperkalemia: Secondary | ICD-10-CM | POA: Diagnosis not present

## 2016-01-18 DIAGNOSIS — I959 Hypotension, unspecified: Secondary | ICD-10-CM | POA: Diagnosis not present

## 2016-01-18 DIAGNOSIS — Z89431 Acquired absence of right foot: Secondary | ICD-10-CM | POA: Diagnosis not present

## 2016-01-18 DIAGNOSIS — D649 Anemia, unspecified: Secondary | ICD-10-CM | POA: Diagnosis not present

## 2016-01-18 DIAGNOSIS — Z515 Encounter for palliative care: Secondary | ICD-10-CM | POA: Diagnosis not present

## 2016-01-18 DIAGNOSIS — E872 Acidosis: Secondary | ICD-10-CM | POA: Diagnosis not present

## 2016-01-18 DIAGNOSIS — I130 Hypertensive heart and chronic kidney disease with heart failure and stage 1 through stage 4 chronic kidney disease, or unspecified chronic kidney disease: Secondary | ICD-10-CM | POA: Diagnosis not present

## 2016-01-18 DIAGNOSIS — Z89432 Acquired absence of left foot: Secondary | ICD-10-CM | POA: Diagnosis not present

## 2016-01-18 DIAGNOSIS — E1140 Type 2 diabetes mellitus with diabetic neuropathy, unspecified: Secondary | ICD-10-CM | POA: Diagnosis present

## 2016-01-18 DIAGNOSIS — Z79899 Other long term (current) drug therapy: Secondary | ICD-10-CM | POA: Diagnosis not present

## 2016-01-18 DIAGNOSIS — R600 Localized edema: Secondary | ICD-10-CM | POA: Diagnosis not present

## 2016-01-18 DIAGNOSIS — Z8673 Personal history of transient ischemic attack (TIA), and cerebral infarction without residual deficits: Secondary | ICD-10-CM | POA: Diagnosis not present

## 2016-01-18 DIAGNOSIS — R609 Edema, unspecified: Secondary | ICD-10-CM | POA: Diagnosis present

## 2016-01-18 DIAGNOSIS — K644 Residual hemorrhoidal skin tags: Secondary | ICD-10-CM

## 2016-01-18 DIAGNOSIS — R278 Other lack of coordination: Secondary | ICD-10-CM | POA: Diagnosis not present

## 2016-01-18 DIAGNOSIS — D472 Monoclonal gammopathy: Secondary | ICD-10-CM | POA: Diagnosis present

## 2016-01-18 DIAGNOSIS — Z7984 Long term (current) use of oral hypoglycemic drugs: Secondary | ICD-10-CM | POA: Diagnosis not present

## 2016-01-18 DIAGNOSIS — E8770 Fluid overload, unspecified: Secondary | ICD-10-CM | POA: Diagnosis not present

## 2016-01-18 DIAGNOSIS — R262 Difficulty in walking, not elsewhere classified: Secondary | ICD-10-CM | POA: Diagnosis not present

## 2016-01-18 DIAGNOSIS — I5021 Acute systolic (congestive) heart failure: Secondary | ICD-10-CM | POA: Diagnosis not present

## 2016-01-18 DIAGNOSIS — I481 Persistent atrial fibrillation: Secondary | ICD-10-CM | POA: Diagnosis not present

## 2016-01-18 DIAGNOSIS — M6281 Muscle weakness (generalized): Secondary | ICD-10-CM | POA: Diagnosis not present

## 2016-01-18 LAB — CBC
HCT: 29 % — ABNORMAL LOW (ref 36.0–46.0)
Hemoglobin: 8.5 g/dL — ABNORMAL LOW (ref 12.0–15.0)
MCH: 23.2 pg — ABNORMAL LOW (ref 26.0–34.0)
MCHC: 29.3 g/dL — ABNORMAL LOW (ref 30.0–36.0)
MCV: 79.2 fL (ref 78.0–100.0)
Platelets: 209 10*3/uL (ref 150–400)
RBC: 3.66 MIL/uL — ABNORMAL LOW (ref 3.87–5.11)
RDW: 20.3 % — ABNORMAL HIGH (ref 11.5–15.5)
WBC: 8.3 10*3/uL (ref 4.0–10.5)

## 2016-01-18 LAB — BASIC METABOLIC PANEL
Anion gap: 13 (ref 5–15)
BUN: 60 mg/dL — AB (ref 6–20)
CHLORIDE: 117 mmol/L — AB (ref 101–111)
CO2: 11 mmol/L — ABNORMAL LOW (ref 22–32)
Calcium: 8.1 mg/dL — ABNORMAL LOW (ref 8.9–10.3)
Creatinine, Ser: 5.44 mg/dL — ABNORMAL HIGH (ref 0.44–1.00)
GFR calc Af Amer: 8 mL/min — ABNORMAL LOW (ref >60)
GFR calc non Af Amer: 7 mL/min — ABNORMAL LOW (ref >60)
GLUCOSE: 86 mg/dL (ref 65–99)
POTASSIUM: 5 mmol/L (ref 3.5–5.1)
Sodium: 141 mmol/L (ref 135–145)

## 2016-01-18 LAB — MRSA PCR SCREENING: MRSA BY PCR: NEGATIVE

## 2016-01-18 LAB — GLUCOSE, CAPILLARY
GLUCOSE-CAPILLARY: 91 mg/dL (ref 65–99)
GLUCOSE-CAPILLARY: 76 mg/dL (ref 65–99)
GLUCOSE-CAPILLARY: 123 mg/dL — AB (ref 65–99)
Glucose-Capillary: 88 mg/dL (ref 65–99)
Glucose-Capillary: 119 mg/dL — ABNORMAL HIGH (ref 65–99)
Glucose-Capillary: 111 mg/dL — ABNORMAL HIGH (ref 65–99)

## 2016-01-18 MED ORDER — POLYETHYLENE GLYCOL 3350 17 G PO PACK
17.0000 g | PACK | Freq: Every day | ORAL | Status: DC | PRN
  Administered 2016-01-24: 17 g via ORAL

## 2016-01-18 MED ORDER — LIDOCAINE 4 % EX CREA
TOPICAL_CREAM | Freq: Two times a day (BID) | CUTANEOUS | Status: DC | PRN
  Filled 2016-01-18: qty 5

## 2016-01-18 MED ORDER — ACETAMINOPHEN 650 MG RE SUPP: 650.0000 mg | Freq: Four times a day (QID) | RECTAL | Status: DC | PRN

## 2016-01-18 MED ORDER — SODIUM CHLORIDE 0.9 % IV SOLN: INTRAVENOUS | Status: DC

## 2016-01-18 MED ORDER — ATORVASTATIN CALCIUM 40 MG PO TABS
40.0000 mg | ORAL_TABLET | Freq: Every day | ORAL | Status: DC
  Administered 2016-01-18 – 2016-01-25 (×8): 40 mg via ORAL
  Filled 2016-01-18 (×8): qty 1

## 2016-01-18 MED ORDER — HYDROCORTISONE 2.5 % RE CREA
1.0000 "application " | TOPICAL_CREAM | Freq: Two times a day (BID) | RECTAL | Status: DC
  Administered 2016-01-18 – 2016-01-25 (×15): 1 via RECTAL
  Filled 2016-01-18: qty 28.35

## 2016-01-18 MED ORDER — FUROSEMIDE 10 MG/ML IJ SOLN
40.0000 mg | Freq: Once | INTRAMUSCULAR | Status: AC
  Administered 2016-01-18: 40 mg via INTRAVENOUS
  Filled 2016-01-18: qty 4

## 2016-01-18 MED ORDER — INSULIN ASPART 100 UNIT/ML ~~LOC~~ SOLN
0.0000 [IU] | Freq: Three times a day (TID) | SUBCUTANEOUS | Status: DC
  Administered 2016-01-18: 1 [IU] via SUBCUTANEOUS
  Administered 2016-01-19: 2 [IU] via SUBCUTANEOUS
  Administered 2016-01-19 – 2016-01-20 (×2): 3 [IU] via SUBCUTANEOUS
  Administered 2016-01-20 – 2016-01-21 (×3): 2 [IU] via SUBCUTANEOUS
  Administered 2016-01-22: 1 [IU] via SUBCUTANEOUS

## 2016-01-18 MED ORDER — FUROSEMIDE 10 MG/ML IJ SOLN
80.0000 mg | Freq: Two times a day (BID) | INTRAMUSCULAR | Status: DC
  Administered 2016-01-18 – 2016-01-19 (×2): 80 mg via INTRAVENOUS
  Filled 2016-01-18 (×2): qty 8

## 2016-01-18 MED ORDER — ACETAMINOPHEN 325 MG PO TABS
650.0000 mg | ORAL_TABLET | Freq: Four times a day (QID) | ORAL | Status: DC | PRN
  Filled 2016-01-18: qty 2

## 2016-01-18 MED ORDER — LEVOTHYROXINE SODIUM 112 MCG PO TABS
112.0000 ug | ORAL_TABLET | Freq: Every day | ORAL | Status: DC
  Administered 2016-01-18 – 2016-01-25 (×8): 112 ug via ORAL
  Filled 2016-01-18 (×8): qty 1

## 2016-01-18 MED ORDER — SENNOSIDES-DOCUSATE SODIUM 8.6-50 MG PO TABS: 2.0000 | ORAL_TABLET | Freq: Every day | ORAL | Status: DC | PRN

## 2016-01-18 MED ORDER — HEPARIN SODIUM (PORCINE) 5000 UNIT/ML IJ SOLN
5000.0000 [IU] | Freq: Three times a day (TID) | INTRAMUSCULAR | Status: DC
  Administered 2016-01-18 – 2016-01-25 (×22): 5000 [IU] via SUBCUTANEOUS
  Filled 2016-01-18 (×21): qty 1

## 2016-01-18 MED ORDER — PANTOPRAZOLE SODIUM 40 MG PO TBEC
40.0000 mg | DELAYED_RELEASE_TABLET | Freq: Every day | ORAL | Status: DC
  Administered 2016-01-19 – 2016-01-25 (×7): 40 mg via ORAL
  Filled 2016-01-18 (×9): qty 1

## 2016-01-18 MED ORDER — FUROSEMIDE 10 MG/ML IJ SOLN: 20.0000 mg | Freq: Once | INTRAMUSCULAR | Status: DC

## 2016-01-18 MED ORDER — WITCH HAZEL-GLYCERIN EX PADS
MEDICATED_PAD | CUTANEOUS | Status: DC | PRN
  Filled 2016-01-18: qty 100

## 2016-01-18 NOTE — ED Notes (Signed)
Report called to [place], [place]

## 2016-01-18 NOTE — Consult Note (Signed)
Advanced Heart Failure Team Consult Note  Referring Physician: Dr. Evette Doffing Primary Physician: Stacy Falcon, MD Primary Cardiologist:  Previous [facility], ~ 10 years since follow up.   Reason for Consultation: A/C systolic CHF complicated by ARF/ESRD  HPI:    [name] is a 81 y.o. female with complicated medical history including Chronic systolic CHF (LVEF 14-23% 09/2014), Afib, CVA, T2DM, HTN, HLD, hypothyroidism, osteomyelitis s/p bilateral toe amputations. MGUS vs MM, and chronic anemia with scheduled transfusions/EPO.    Pt has distant following with Cardiology but hasn't been seen in many years.  She presented to [facility] 01/17/16 with worsening edema and fatigue, as well as severe rectal pain.  Also complaining of URI symptoms x 2 weeks. Pertinent labs on admission include Cr 5.36 (Baseline closer to 3.0), K 5.1, BNP 238.3, Negative troponin, Hgb 9.2. HF team consulted with low EF and difficult diuresis.   Daughter present in room.  Pt has been more deconditioning over past two weeks, hasn't been out of the house.  Gets around minimally with a walker. SOB with mild exertion and increase pedal edema over the same time period.   Eats lots of ice and drinks throughout the day.  Follows with [facility] but unsure which MD. Lives at home alone and children run her errands for her. Doesn't want to think about dialysis.  Denies lightheadedness or dizziness but not out of bed here in hospital.  Review of Systems: [y] = yes, _0  = no   General: Weight gain [y]; Weight loss _1 ; Anorexia _2 ; Fatigue [y]; Fever _3 ; Chills _4 ; Weakness [y]  Cardiac: Chest pain/pressure _5 ; Resting SOB _6 ; Exertional SOB [y]; Orthopnea [y]; Pedal Edema [y]; Palpitations _7 ; Syncope _8 ; Presyncope _9 ; Paroxysmal nocturnal dyspnea_10   Pulmonary: Cough [y]; Wheezing_11 ; Hemoptysis_12 ; Sputum _13 ; Snoring _14   GI: Vomiting_15 ; Dysphagia_16 ; Melena_17 ; Hematochezia _18 ; Heartburn_19 ; Abdominal pain [  ]; Constipation _20 ; Diarrhea _21 ; BRBPR _22   GU: Hematuria_23 ; Dysuria _24 ; Nocturia_25   Vascular: Pain in legs with walking _26 ; Pain in feet with lying flat _27 ; Non-healing sores _28 ; Stroke _29 ; TIA _30 ; Slurred speech _31 ;  Neuro: Headaches_32 ; Vertigo_33 ; Seizures_34 ; Paresthesias_35 ;Blurred vision _36 ; Diplopia _37 ; Vision changes _38   Ortho/Skin: Arthritis [y]; Joint pain [y]; Muscle pain _39 ; Joint swelling _40 ; Back Pain _41 ; Rash _42   Psych: Depression_43 ; Anxiety_44   Heme: Bleeding problems _45 ; Clotting disorders _46 ; Anemia _47   Endocrine: Diabetes [y]; Thyroid dysfunction[y]  Home Medications Prior to Admission medications   Medication Sig Start Date End Date Taking? Authorizing Provider  atorvastatin (LIPITOR) 40 MG tablet TAKE 1 TABLET BY MOUTH EVERY DAY 08/01/15  Yes Bartholomew Crews, MD  calcitRIOL (ROCALTROL) 0.25 MCG capsule Take 1 capsule by mouth every Monday, Wednesday, and Friday. 04/10/14  Yes Historical Provider, MD  cetirizine (ZYRTEC) 10 MG chewable tablet Chew 1 tablet (10 mg total) by mouth daily. Patient taking differently: Chew 10 mg by mouth daily as needed for allergies.  09/19/15  Yes Carly J Rivet, MD  ELIQUIS 5 MG TABS tablet TAKE 1 TABLET(5 MG) BY MOUTH TWICE DAILY 06/15/15  Yes Stacy Falcon, MD  furosemide (LASIX) 20 MG tablet TAKE 1 TABLET(20 MG) BY MOUTH DAILY Patient taking differently: TAKE 1 TABLET(20 MG) BY  MOUTH TWICE DAILY. 08/24/15  Yes Stacy Falcon, MD  glipiZIDE (GLUCOTROL) 10 MG tablet Take 0.5 tablets (5 mg total) by mouth 2 (two) times daily with a meal. 08/29/15  Yes Stacy Falcon, MD  JANUVIA 25 MG tablet TAKE 1 TABLET BY MOUTH EVERY DAY 09/05/15  Yes Stacy Falcon, MD  levothyroxine (SYNTHROID, LEVOTHROID) 112 MCG tablet TAKE 1 TABLET(112 MCG) BY MOUTH DAILY BEFORE BREAKFAST 10/24/15  Yes Stacy Falcon, MD  lisinopril (PRINIVIL,ZESTRIL) 40 MG tablet TAKE 1 TABLET BY MOUTH EVERY DAY 12/31/15  Yes Stacy Falcon, MD  metoprolol tartrate  (LOPRESSOR) 25 MG tablet Take 1 tablet (25 mg total) by mouth 2 (two) times daily. 09/03/15  Yes Oval Linsey, MD  pantoprazole (PROTONIX) 40 MG tablet TAKE 1 TABLET BY MOUTH EVERY DAY 07/07/15  Yes Stacy Falcon, MD  polyethylene glycol (MIRALAX / GLYCOLAX) packet Take 17 g by mouth daily as needed for mild constipation or moderate constipation.    Yes Historical Provider, MD  sennosides-docusate sodium (SENOKOT-S) 8.6-50 MG tablet Take 2 tablets by mouth daily as needed for constipation.    Yes Historical Provider, MD  UNABLE TO FIND Inject 1 each into the skin every 28 (twenty-eight) days. Vitamin D injection monthly at the cancer center.   Yes Historical Provider, MD  Blood Glucose Monitoring Suppl (ONETOUCH VERIO FLEX SYSTEM) W/DEVICE KIT 1 each by Does not apply route every morning. Check blood sugar one time each day 05/07/15   Stacy Falcon, MD  Cholecalciferol (VITAMIN D3) 10000 UNITS capsule Take 1 capsule (10,000 Units total) by mouth once a week. Patient not taking: Reported on 01/17/2016 08/04/14   Stacy Falcon, MD  clotrimazole (LOTRIMIN) 1 % cream Apply 1 application topically 2 (two) times daily. Patient not taking: Reported on 01/17/2016 09/19/15   Sindy Guadeloupe Rivet, MD  glucose blood (ONETOUCH VERIO) test strip Check blood sugar one time each day 05/07/15   Stacy Falcon, MD  [facility] LANCETS FINE MISC Check blood sugar one time each day 05/07/15   Stacy Falcon, MD    Past Medical History: Past Medical History:  Diagnosis Date  . Anemia    b12 def, iron def, follow at cancer center, gets B12 and  another injection there. Could not remeber the name. Dr. Ralene Ok is her  cancer doctor  . Asthma   . Atrial fibrillation ([place])   . Blood transfusion    two or more yrs ago  . Chronic renal insufficiency   . Chronic venous insufficiency   . Diabetes mellitus   . Hyperlipidemia   . Hypertension   . Hypothyroidism    thyroid removed 4 or more yrs ago  . MGUS (monoclonal  gammopathy of unknown significance)   . Osteomyelitis ([place])    s/p Rt 2nd toe and left 5 toes  amputation in 1/12  by Dr. Sharol Given  . Stroke ([place])   . Thyroid disease    hypothyroidism h/o hyperthyroidism s/p ablation/ectomy    Past Surgical History: Past Surgical History:  Procedure Laterality Date  . AMPUTATION  04/30/2011   Procedure: AMPUTATION FOOT;  Surgeon: Newt Minion, MD;  Location: [place];  Service: Orthopedics;  Laterality: Right;  Right Midfoot Amputation  . APPENDECTOMY     teenager  . COLONOSCOPY N/A 12/17/2014   Procedure: COLONOSCOPY;  Surgeon: Ladene Artist, MD;  [facility] [address] ENDOSCOPY;  Service: Endoscopy;  Laterality: N/A;  . ESOPHAGOGASTRODUODENOSCOPY N/A 12/15/2014   Procedure: ESOPHAGOGASTRODUODENOSCOPY (EGD);  Surgeon: Ladene Artist, MD;  Location[facility] ENDOSCOPY;  Service: Endoscopy;  Laterality: N/A;  . EYE SURGERY     cat ext ou    Family History: Family History  Problem Relation Age of Onset  . Anemia Mother   . Colon cancer Mother     rectal  . HIV Brother   . Cancer Brother     Social History: Social History   Social History  . Marital status: Widowed    Spouse name: N/A  . Number of children: N/A  . Years of education: N/A   Social History Main Topics  . Smoking status: Never Smoker  . Smokeless tobacco: Never Used  . Alcohol use No  . Drug use: No  . Sexual activity: Not Asked   Other Topics Concern  . None   Social History Narrative  . None    Allergies:  No Known Allergies  Objective:    Vital Signs:   Temp:  [97.5 F (36.4 C)-98.1 F (36.7 C)] 98.1 F (36.7 C) (09/01 1208) Pulse Rate:  [80-110] 110 (09/01 1208) Resp:  [15-20] 18 (09/01 1208) BP: (76-107)/(37-72) 78/46 (09/01 1210) SpO2:  [92 %-100 %] 99 % (09/01 1208) Weight:  [207 lb 11.2 oz (94.2 kg)-209 lb 3.2 oz (94.9 kg)] 207 lb 11.2 oz (94.2 kg) (09/01 1205)    Weight change: Filed Weights   01/18/16 0314 01/18/16 1205  Weight: 209 lb 3.2 oz (94.9 kg) 207  lb 11.2 oz (94.2 kg)    Intake/Output:   Intake/Output Summary (Last 24 hours) at 01/18/16 1342 Last data filed at 01/18/16 0920  Gross per 24 hour  Intake              120 ml  Output                0 ml  Net              120 ml     Physical Exam: General:  Chronically ill and elderly appearing.  HEENT: normal occasionally closes eyes. Neck: supple. JVP to jaw or higher, difficult to assess with pt size. Carotids 2+ bilat; no bruits. No lymphadenopathy or thyromegaly appreciated. Cor: PMI nondisplaced. Irregular rate and rhythm, slightly tachy.  Lungs: Diminished  Abdomen: Obese, NT, mild distended but soft, no HSM. No bruits or masses. +BS  Extremities: no cyanosis, clubbing, rash. 3+ edema up into thighs. Neuro: alert & orientedx3, cranial nerves grossly intact. moves all 4 extremities w/o difficulty. Affect pleasant  Telemetry: Reviewed, Afib RVR 110s  Labs: Basic Metabolic Panel:  Recent Labs Lab 01/17/16 2003 01/18/16 0455  NA 139 141  K 5.1 5.0  CL 113* 117*  CO2 14* 11*  GLUCOSE 175* 86  BUN 69* 60*  CREATININE 5.36* 5.44*  CALCIUM 8.3* 8.1*    Liver Function Tests:  Recent Labs Lab 01/17/16 2003  AST 36  ALT 18  ALKPHOS 117  BILITOT 1.6*  PROT 7.2  ALBUMIN 3.5    Recent Labs Lab 01/17/16 2003  LIPASE 65*   No results for input(s): AMMONIA in the last 168 hours.  CBC:  Recent Labs Lab 01/17/16 2003 01/18/16 0455  WBC 9.2 8.3  NEUTROABS 7.8*  --   HGB 9.2* 8.5*  HCT 30.5* 29.0*  MCV 78.0 79.2  PLT 253 209    Cardiac Enzymes: No results for input(s): CKTOTAL, CKMB, CKMBINDEX, TROPONINI in the last 168 hours.  BNP: BNP (last 3 results)  Recent Labs  01/17/16 2003  BNP 238.3*    ProBNP (last 3 results) No results for input(s): PROBNP in the last 8760 hours.   CBG:  Recent Labs Lab 01/18/16 0313 01/18/16 0642 01/18/16 0752 01/18/16 1148  GLUCAP 91 76 88 119*    Coagulation Studies: No results for input(s):  LABPROT, INR in the last 72 hours.  Other results: EKG: Pending  Imaging: Ct Abdomen Pelvis Wo Contrast  Result Date: 01/17/2016 CLINICAL DATA:  Rectal pain for 1 day, worse with sitting. EXAM: CT ABDOMEN AND PELVIS WITHOUT CONTRAST TECHNIQUE: Multidetector CT imaging of the abdomen and pelvis was performed following the standard protocol without IV contrast. COMPARISON:  Ultrasound 02/01/2013 FINDINGS: There is hyperdense layering material within the gallbladder lumen, possibly milk of calcium. Discrete calculi are not visible. No bile duct dilatation. Unremarkable unenhanced appearances of the liver. There are unremarkable unenhanced appearances of the pancreas, spleen and adrenals. Kidneys appear mildly atrophic but are otherwise unremarkable. Collecting systems and ureters are unremarkable. Urinary bladder exhibits mild uniform mural thickening which could represent cystitis although it may also be related to the incomplete urinary bladder distention. There are unremarkable appearances of the stomach, small bowel and colon except for mild uncomplicated diverticulosis. The normal caliber aorta is heavily calcified. There are multiple calcified uterine fibroids measuring up to about 2.7 cm. No adnexal mass is evident. No focal acute inflammatory changes are evident in the abdomen or pelvis. There is a small volume ascites collected in the left adnexal region. There is a fat containing umbilical hernia In the lower chest, there is a moderate left pleural effusion which is incompletely imaged. There are posterior lung base opacities which are incompletely imaged but more likely represent benign scarring or atelectasis. No significant skeletal lesion is evident. IMPRESSION: 1. No acute inflammatory changes are evident in the abdomen or pelvis. No bowel obstruction or perforation. No perianal or perirectal abscess to account for the rectal pain. 2. Multiple calcified uterine fibroids measuring up to about 2.7  cm. 3. Hyperdense layering material within the gallbladder lumen, possibly milk of calcium bile. 4. Fat containing umbilical hernia. 5. Moderate left pleural effusion, incompletely imaged. Electronically Signed   By: Andreas Newport M.D.   On: 01/17/2016 21:25   Dg Abdomen Acute W/chest  Result Date: 01/17/2016 CLINICAL DATA:  Rectal pain for 1 day. EXAM: DG ABDOMEN ACUTE W/ 1V CHEST COMPARISON:  December 12, 2014 FINDINGS: There is no evidence of dilated bowel loops or free intraperitoneal air. No radiopaque calculi or other significant radiographic abnormality is seen. Degenerative joint changes of the spine and scoliosis of the spine are identified. Images of the lungs demonstrate patchy consolidation of left lung base. There bilateral pleural effusions. There is increased central pulmonary vessel caliber. The heart size is enlarged. IMPRESSION: No bowel obstruction or free air. Bilateral pleural effusions. Patchy consolidation of left lung base, underlying pneumonia is not excluded. Central pulmonary vascular congestion with cardiomegaly. Electronically Signed   By: Abelardo Diesel M.D.   On: 01/17/2016 18:54      Medications:     Current Medications: . atorvastatin  40 mg Oral Daily  . heparin  5,000 Units Subcutaneous Q8H  . hydrocortisone  1 application Rectal BID  . insulin aspart  0-9 Units Subcutaneous TID WC  . levothyroxine  112 mcg Oral QAC breakfast  . pantoprazole  40 mg Oral Daily     Infusions:      Assessment   1. Acute on chronic systolic CHF (LVEF 16-10% 09/2014) 2. ARF  on CKD Stage IV-V  3. Chronic Afib - Acute RVR 4. Hx of CVA 5. HTN 6. HLD 7. MGUS vs MM 8. Chronic anemia with scheduled transfusions/EPO.   9. T2DM 10. Hypotension  Plan   Pt with marked volume overload in setting of ESRD and A/C systolic CHF. Repeat echo pending.    Agree with move to stepdown.  Will likely need pressors for pressure support.  MD has discussed with IM to get renal  involved.  ? If patient is candidate for dialysis.  With overall decline and deconditioning, ? How much she would tolerate, if at all. With progressive ESRD she is not a candidate for advanced therapies.    Think having Goals of Care talk this admission would be prudent.     Length of Stay: 0  Shirley Friar PA-C 01/18/2016, 1:42 PM  Advanced Heart Failure Team Pager [phone] (M-F; [place])  Please contact [facility] for night-coverage after hours (4p -7a ) and weekends on [email or web address]  Patient seen and examined with Stacy Kilts, PA-C. We discussed all aspects of the encounter. I agree with the assessment and plan as stated above.   Difficult situation. Ms. Dunsmore is a 81 y/o woman with multiple medical problems including advanced DM2 with baseline CKD IV,  Chronic AF and chronic systolic HF with EF 17-91% on echo in 2016 with what appears to be possible previous LAD infarct (by echo).   Now admitted with worsening volume overload in setting of progressive renal failure and AF with RVR. Denies CP or SOB. At baseline she is very limited and spends most of her time in bed. Family does all shopping for her. Last time she left the house was 2 weeks ago to play Bingo. She has been incontinent of urine and SBPs ranging 80-100s  While she may have a component of cardio-renal syndrome, my main concern is that she just has progressive multi-system organ failure and may be nearing the end of her life. I spoke with her and her family about this and I do not think her family is quite ready to accept this news yet.   I have requested that renal see her but doubt she will be candidate for long-term HD.   In short-term she MAY warrant transfer to SDU for central line placement for trial of pressor support and IV diuresis followed by possible CVVHD but I am not sure what the endpoint would be as her kidneys are unlikely to recover much.  Agree with repeat echo. If AF remains fast can consider amio  for rate control.   All this being said, with her comorbidities would favor trial of aggressive IV diuresis (with Foley in place). If fails would switch to comfort care.   Navi Erber,MD 2:42 PM

## 2016-01-18 NOTE — H&P (Signed)
Date: 01/18/2016               Patient Name:  [name] MRN: [id number]  DOB: Apr 11, 1935 Age / Sex: 81 y.o., female   PCP: [name], MD         Medical Service: Internal Medicine Teaching Service         Attending Physician: Dr. Lalla Brothers    First Contact: Dr. Minus Liberty Pager: [phone]  Second Contact: Dr. Martyn Malay Pager: [phone]       After Hours (After 5p/  First Contact Pager: [phone]  weekends / holidays): Second Contact Pager: [phone]   Chief Complaint: Rectal pain and leg swelling  History of Present Illness: [name] is a 81 y.o. woman with PMH HFrEF (EF 35-40%, 09/2014), Afib, CVA, T2DM, HTN, HLD, hypothyroidism, osteomyelitis s/p bilateral toe amputations, MGUS vs MM, and chronic anemia (req transfusions and EPO) who presents for progressive leg swelling and fatigue over 4-5 days, as well as severe rectal pain for 1 day. She has also had URI symptoms (sinus congestion, cough, rhinorrhea, malaise) over the past two weeks that have mostly resolved (only congestion remains). Her leg swelling began on Monday (8/28) and has progressed to her thighs and is now impairing ambulation completely. Her mobility is also impaired by generalized fatigue and dizziness with standing and activity. Previously she was able to walk around at home and outside with a walker. She has a caretaker and daughter who help her with medications, most ADLs, all IADLs but she often will not take her medications. Her symptoms have coincided with decreased urine output and weight gain (now 209 lbs, last recorded 196 lbs in 11/2015). Wednesday evening she had several large loose-to-normal consistency bowel movements that were dark (no BRB) and since then has had severe rectal pain not alleviated by creams or rest at home. She endorses recent bowel incontinence requiring the use of diapers (unsure how often they are changed), pica (ice), and decreased appetite. She denies fevers, chills,  dyspnea, chest pain, abdominal pain, dysuria, constipation, or vaginal discharge/bleeding. She presented to [facility] ED the evening of 01/17/16 for above complaints, labs drawn, received 2L NS, and transferred to [facility].  On arrival to [facility], vitals - afebrile, HR 108, RR 20, BP 89/61, 100% on RA. Labs were remarkable for CO2 14, BUN 69, Cr 5.36 (baseline 2.5-3.0), gap 12, BNP 238, trop 0.02, lactic acid 1.43, Hgb 9.2 (baseline 10-11), U/A unremarkable, FOBT negative. CXR showed bilat pleural effusions, patchy consolidation left lung base, and pulmonary vascular congestion with cardiomegaly. CT A/P without contrast showed moderate left pleural effusion and no acute inflammatory sources in the abdomen or pelvis.   Meds:  Current Meds  Medication Sig  . atorvastatin (LIPITOR) 40 MG tablet TAKE 1 TABLET BY MOUTH EVERY DAY  . calcitRIOL (ROCALTROL) 0.25 MCG capsule Take 1 capsule by mouth every Monday, Wednesday, and Friday.  . cetirizine (ZYRTEC) 10 MG chewable tablet Chew 1 tablet (10 mg total) by mouth daily. (Patient taking differently: Chew 10 mg by mouth daily as needed for allergies. )  . ELIQUIS 5 MG TABS tablet TAKE 1 TABLET(5 MG) BY MOUTH TWICE DAILY  . furosemide (LASIX) 20 MG tablet TAKE 1 TABLET(20 MG) BY MOUTH DAILY (Patient taking differently: TAKE 1 TABLET(20 MG) BY MOUTH TWICE DAILY.)  . glipiZIDE (GLUCOTROL) 10 MG tablet Take 0.5 tablets (5 mg total) by mouth 2 (two) times daily with a meal.  . JANUVIA 25  MG tablet TAKE 1 TABLET BY MOUTH EVERY DAY  . levothyroxine (SYNTHROID, LEVOTHROID) 112 MCG tablet TAKE 1 TABLET(112 MCG) BY MOUTH DAILY BEFORE BREAKFAST  . lisinopril (PRINIVIL,ZESTRIL) 40 MG tablet TAKE 1 TABLET BY MOUTH EVERY DAY  . metoprolol tartrate (LOPRESSOR) 25 MG tablet Take 1 tablet (25 mg total) by mouth 2 (two) times daily.  . pantoprazole (PROTONIX) 40 MG tablet TAKE 1 TABLET BY MOUTH EVERY DAY  . polyethylene glycol (MIRALAX / GLYCOLAX) packet Take 17 g by mouth daily as  needed for mild constipation or moderate constipation.   . sennosides-docusate sodium (SENOKOT-S) 8.6-50 MG tablet Take 2 tablets by mouth daily as needed for constipation.   Marland Kitchen UNABLE TO FIND Inject 1 each into the skin every 28 (twenty-eight) days. Vitamin D injection monthly at the cancer center.    Allergies: Allergies as of 01/17/2016  . (No Known Allergies)   Past Medical History:  Diagnosis Date  . Anemia    b12 def, iron def, follow at cancer center, gets B12 and  another injection there. Could not remeber the name. Dr. Ralene Ok is her  cancer doctor  . Asthma   . Atrial fibrillation ([place])   . Blood transfusion    two or more yrs ago  . Chronic renal insufficiency   . Chronic venous insufficiency   . Diabetes mellitus   . Hyperlipidemia   . Hypertension   . Hypothyroidism    thyroid removed 4 or more yrs ago  . MGUS (monoclonal gammopathy of unknown significance)   . Osteomyelitis ([place])    s/p Rt 2nd toe and left 5 toes  amputation in 1/12  by Dr. Sharol Given  . Stroke ([place])   . Thyroid disease    hypothyroidism h/o hyperthyroidism s/p ablation/ectomy    Family History:  Family History  Problem Relation Age of Onset  . Anemia Mother   . Colon cancer Mother     rectal  . HIV Brother   . Cancer Brother     Social History:  Social History   Social History  . Marital status: Widowed    Spouse name: N/A  . Number of children: N/A  . Years of education: N/A   Occupational History  . Not on file.   Social History Main Topics  . Smoking status: Never Smoker  . Smokeless tobacco: Never Used  . Alcohol use No  . Drug use: No  . Sexual activity: Not on file   Other Topics Concern  . Not on file   Social History Narrative  . No narrative on file    Review of Systems: A complete ROS was negative except as per HPI.   Physical Exam: Blood pressure (!) 89/61, pulse (!) 108, temperature 97.8 F (36.6 C), temperature source Oral, resp. rate 20, height 5\' 6"   (1.676 m), weight 94.9 kg (209 lb 3.2 oz), SpO2 100 %.  General appearance: Elderly woman laying on side in bed, in moderate distress, conversational HENT: Normocephalic, atraumatic, moist mucous membranes, neck supple Eyes: PERRL, EOM inact Cardiovascular: Irregular rate and rhythm, distant heart sounds, no murmurs, rubs, gallops Respiratory: R lung grossly clear to auscultation, L lung with decreased breath sounds at base, normal work of breathing Abdomen: Obese, BS+, soft, non-tender, non-distended Rectal: Visualized diffusely erythematous/inflamed perianal skin, external hemorrhoid present at inferior anal verge, no visible blood or fecal matter Extremities: 3+ pitting edema to proximal thighs bilaterally, tight and tender to palpation, 1+ peripheral pulses, bilateral toes amputated at midfoot Skin: Warm,  dry, intact Neuro: Alert and oriented, cranial nerves grossly intact Psych: Appropriate affect, clear speech    Ct Abdomen Pelvis Wo Contrast Result Date: 01/17/2016 CLINICAL DATA:  Rectal pain for 1 day, worse with sitting. EXAM: CT ABDOMEN AND PELVIS WITHOUT CONTRAST TECHNIQUE: Multidetector CT imaging of the abdomen and pelvis was performed following the standard protocol without IV contrast. COMPARISON:  Ultrasound 02/01/2013 FINDINGS: There is hyperdense layering material within the gallbladder lumen, possibly milk of calcium. Discrete calculi are not visible. No bile duct dilatation. Unremarkable unenhanced appearances of the liver. There are unremarkable unenhanced appearances of the pancreas, spleen and adrenals. Kidneys appear mildly atrophic but are otherwise unremarkable. Collecting systems and ureters are unremarkable. Urinary bladder exhibits mild uniform mural thickening which could represent cystitis although it may also be related to the incomplete urinary bladder distention. There are unremarkable appearances of the stomach, small bowel and colon except for mild uncomplicated  diverticulosis. The normal caliber aorta is heavily calcified. There are multiple calcified uterine fibroids measuring up to about 2.7 cm. No adnexal mass is evident. No focal acute inflammatory changes are evident in the abdomen or pelvis. There is a small volume ascites collected in the left adnexal region. There is a fat containing umbilical hernia In the lower chest, there is a moderate left pleural effusion which is incompletely imaged. There are posterior lung base opacities which are incompletely imaged but more likely represent benign scarring or atelectasis. No significant skeletal lesion is evident. IMPRESSION: 1. No acute inflammatory changes are evident in the abdomen or pelvis. No bowel obstruction or perforation. No perianal or perirectal abscess to account for the rectal pain. 2. Multiple calcified uterine fibroids measuring up to about 2.7 cm. 3. Hyperdense layering material within the gallbladder lumen, possibly milk of calcium bile. 4. Fat containing umbilical hernia. 5. Moderate left pleural effusion, incompletely imaged. Electronically Signed   By: Andreas Newport M.D.   On: 01/17/2016 21:25   Dg Abdomen Acute W/chest  Result Date: 01/17/2016 CLINICAL DATA:  Rectal pain for 1 day. EXAM: DG ABDOMEN ACUTE W/ 1V CHEST COMPARISON:  December 12, 2014 FINDINGS: There is no evidence of dilated bowel loops or free intraperitoneal air. No radiopaque calculi or other significant radiographic abnormality is seen. Degenerative joint changes of the spine and scoliosis of the spine are identified. Images of the lungs demonstrate patchy consolidation of left lung base. There bilateral pleural effusions. There is increased central pulmonary vessel caliber. The heart size is enlarged. IMPRESSION: No bowel obstruction or free air. Bilateral pleural effusions. Patchy consolidation of left lung base, underlying pneumonia is not excluded. Central pulmonary vascular congestion with cardiomegaly. Electronically  Signed   By: Abelardo Diesel M.D.   On: 01/17/2016 18:54    Assessment & Plan by Problem:  Hypervolemia, likely CHF exacerbation, history of HFrEF - LVEF 35-40% in 2015, now with acute weight gain (209lbs, dry weight 180lbs (?)), lower extremity edema, pulmonary congestion/cardiomegaly, pleural effusion, but hypotensive to 80s/40s. Unclear etiology, medication non-compliance, diet, +/- viral illness.   -- Will require formal cardiology/HF consult tomorrow, repeat TTE indicated  -- Strict I/Os, daily weights  -- Telemetry  -- Check 12-lead EKG for changes from previous  -- Check TSH  -- Consider gentle diuresis - lasix 20mg  IV  -- Consider BB/CCB if Afib with RVR present  Acute on chronic renal failure, likely cardiorenal, Cr 5.36 from previous baseline 2.5-3.0, chronic renal failure from DM/HTN/MGUS vs MM. If cardiorenal is etiology, renal function may ultimately improve  with diuresis, although the patient is currently hypotensive.   -- Follow urine Na, urine Cr, and AM BMP  -- Avoid NSAIDs, contrast, nephrotoxins, hold home Eliquis  -- Place foley catheter  -- Strict I/Os  -- Consider low dose diuresis - lasix 20mg  IV  Rectal pain, likely hemorrhoids in setting of numerous recent BMs +/- diaper rash (poor mobility and unclear changing frequency). Has history of hemorrhoids in past  -- Hydrocortisone rectal cream BID  -- Lidocaine cream BID prn for rectal pain  -- Witch hazel-glycerin pad, topical, as need for itching  -- Sitz bath prn  -- Allow patient to rest on side  -- Senna-S and Miralax PRN for constipation  -- Tylenol 650mg  prn for pain  Atrial fibrillation, CHADsVASc score 8, ongoing, on metoprolol and eliquis at home but taking meds inconsistently   -- Hold Metoprolol given hypotension and no acute need for rate control, monitor for  RVR  -- Hold Eliquis given acute renal dysfunction  -- Will need transition to Warfarin  T2DM, last A1c 7.3 in Feb 2017  -- SSI and  CBGs HLD  -- Continue home Lipitor  FEN/GI: Heart healthy, replete electrolytes as needed, protonix  DVT ppx: Heparin subQ TID  Code status: Full code  Dispo: Admit patient to Inpatient with expected length of stay greater than 2 midnights.  Signed: Asencion Partridge, MD 01/18/2016, 3:18 AM  Pager: [phone]

## 2016-01-18 NOTE — Progress Notes (Signed)
OT Cancellation Note  Patient Details Name: [name] MRN: [id number] DOB: 1935/03/03   Cancelled Treatment:    Reason Eval/Treat Not Completed: Fatigue/lethargy limiting ability to participate Pt fatigued s/p PT. Will check on pt later in day or next day  [place], Thereasa Parkin 01/18/2016, 12:27 PM

## 2016-01-18 NOTE — Progress Notes (Addendum)
   Subjective: Continuing rectal pain Objective:  Vital signs in last 24 hours: Vitals:   01/18/16 0159 01/18/16 0314 01/18/16 0450 01/18/16 0744  BP: 95/65 (!) 89/61 (!) 86/49 107/64  Pulse: 102 (!) 108 96 (!) 108  Resp: 20 20  18   Temp:  97.8 F (36.6 C)  97.5 F (36.4 C)  TempSrc:  Oral  Oral  SpO2: 92% 100%  99%  Weight:  209 lb 3.2 oz (94.9 kg)    Height:  5\' 6"  (1.676 m)     Physical Exam  Constitutional: She is oriented to person, place, and time. No distress.  Neck:  No appreciable JVD, but difficult to assess in this patient  Cardiovascular: Normal rate and regular rhythm.   Pulmonary/Chest: No respiratory distress.  Rare crackles L base, reduced lung sounds R base  Musculoskeletal:  Pitting edema of bilateral LE to mid shin  Neurological: She is alert and oriented to person, place, and time.  Skin: Skin is warm and dry.   Lab Results  Component Value Date   CREATININE 5.44 (H) 01/18/2016   CREATININE 5.36 (H) 01/17/2016   CREATININE 3.6 (HH) 01/08/2016   BNP (last 3 results)  Recent Labs  01/17/16 2003  BNP 238.3*    Assessment/Plan:  Principal Problem:   Acute on chronic renal failure (HCC) Active Problems:   Hypothyroidism   Diabetes mellitus with neuropathy (HCC)   Atrial fibrillation (HCC)   #Volume Overload #Hypotension Likely CHF exacerbation.  Significant peripheral edema, bilateral pleural effusions, some crackles, though BNP is not particularly high and JVD is difficult to assess.  Normal albumin makes nephrotic syndrome unlikely, but has some protein on UA.  -Diuresis with IV lasix -Follow I/Os and weights -Echo -Heart Failure Consult  #Acute on Chronic Renal Failure  Likely cardiorenal syndrome worsening her CKD.  With history of MGUS, myeloma kidney is possible.  Follow renal function with diuresis.  May need hemodialysis. -Place foley. -Follow-up Urine Na, Cr, Pro:Cr  -Repeat BMP tomorrow -Renal  Consult  #DM2 -SSI  #Hypothyroidism -TSH pending -Continue home levothyroxine  #Rectal Pain Symptomatic treatment for external hemorrhoids. -Hydrocortisone cream   Dispo: Anticipated discharge in approximately 4 day(s).   Minus Liberty, MD 01/18/2016, 8:30 AM Pager: [phone]

## 2016-01-18 NOTE — Consult Note (Signed)
   [facility] CM Inpatient Consult   01/18/2016  [name] 1934/10/24 [id number]  Patient screened for potential [facility] services. Patient is eligible for [facility] Care Management services under patient's [facility]  plan. This is the patient's first admission in 6 months.  Chart review reveals that the current discharge plan is for a skilled nursing facility.  Patient is a 81 y.o. woman with PMH HFrEF (EF 35-40%, 09/2014), Afib, CVA, T2DM, HTN, HLD, hypothyroidism, osteomyelitis s/p bilateral toe amputations, MGUS vs MM, and chronic anemia (req transfusions and EPO) who presents for progressive leg swelling and fatigue over 4-5 days, as well as severe rectal pain for 1 day. She has also had URI symptoms (sinus congestion, cough, rhinorrhea, malaise) over the past two weeks that have mostly resolved (only congestion remains). Her leg swelling began on Monday (8/28) and has progressed to her thighs and is now impairing ambulation completely. Her mobility is also impaired by generalized fatigue and dizziness with standing and activity. Previously she was able to walk around at home and outside with a walker. She has a caretaker and daughter who help her with medications, most ADLs, all IADLs but she often will not take her medications. Her symptoms have coincided with decreased urine output and weight gain. Wednesday evening she had several large loose-to-normal consistency bowel movements that were dark (no BRB) and since then has had severe rectal pain not alleviated by creams or rest at home. She endorses recent bowel incontinence requiring the use of diapers, pica (ice), and decreased appetite per Resident's notes. [facility] not appropriate for post hospital follow up if patient discharges to SNF or for questions contact:   Natividad Brood, RN BSN [facility] Liaison  [phone] business mobile phone Toll free office  [phone]

## 2016-01-18 NOTE — Consult Note (Signed)
Nephrology Service Consult Note:  Date: 01/18/2016               Patient Name:  [name] MRN: [id number]  DOB: [birth year] Age / Sex: 81 y.o., female   PCP: [name], MD         Requesting Physician: Dr. Axel Filler, MD    Consulting Reason:  Acute renal failure     Chief Complaint: volume overload   History of Present Illness:   Patient is a 81 yo woman with PMH of chronic systolic heart failure with EF of 35-40%, chronic afib, CVA, T2DM, HTN, HLD, Hypothyroidism, osteomyelitis, MM vs MGUS, and chronic anemia with scheduled transfusions. She presented with increased lower extremity swelling, and rectal pain for several days.  We were consulted for her acute on chronic renal failure with baseline creatinine around 3, now up to 5.4   Meds: Current Facility-Administered Medications  Medication Dose Route Frequency Provider Last Rate Last Dose  . acetaminophen (TYLENOL) tablet 650 mg  650 mg Oral Q6H PRN Maryellen Pile, MD       Or  . acetaminophen (TYLENOL) suppository 650 mg  650 mg Rectal Q6H PRN Maryellen Pile, MD      . atorvastatin (LIPITOR) tablet 40 mg  40 mg Oral Daily Maryellen Pile, MD   40 mg at 01/18/16 0912  . heparin injection 5,000 Units  5,000 Units Subcutaneous Q8H Maryellen Pile, MD   5,000 Units at 01/18/16 1429  . hydrocortisone (ANUSOL-HC) 2.5 % rectal cream 1 application  1 application Rectal BID Maryellen Pile, MD   1 application at 0000000 1000  . insulin aspart (novoLOG) injection 0-9 Units  0-9 Units Subcutaneous TID WC Maryellen Pile, MD      . levothyroxine (SYNTHROID, LEVOTHROID) tablet 112 mcg  112 mcg Oral QAC breakfast Maryellen Pile, MD   112 mcg at 01/18/16 0528  . lidocaine (LMX) 4 % cream   Topical BID PRN Maryellen Pile, MD      . pantoprazole (PROTONIX) EC tablet 40 mg  40 mg Oral Daily Maryellen Pile, MD      . polyethylene glycol (MIRALAX / GLYCOLAX) packet 17 g  17 g Oral Daily PRN Maryellen Pile, MD      . senna-docusate  (Senokot-S) tablet 2 tablet  2 tablet Oral Daily PRN Maryellen Pile, MD      . witch hazel-glycerin (TUCKS) pad   Topical PRN Maryellen Pile, MD        Allergies: Allergies as of 01/17/2016  . (No Known Allergies)   Past Medical History:  Diagnosis Date  . Anemia    b12 def, iron def, follow at cancer center, gets B12 and  another injection there. Could not remeber the name. Dr. Ralene Ok is her  cancer doctor  . Asthma   . Atrial fibrillation ([place])   . Blood transfusion    two or more yrs ago  . Chronic renal insufficiency   . Chronic venous insufficiency   . Diabetes mellitus   . Hyperlipidemia   . Hypertension   . Hypothyroidism    thyroid removed 4 or more yrs ago  . MGUS (monoclonal gammopathy of unknown significance)   . Osteomyelitis ([place])    s/p Rt 2nd toe and left 5 toes  amputation in 1/12  by Dr. Sharol Given  . Stroke ([place])   . Thyroid disease    hypothyroidism h/o hyperthyroidism s/p ablation/ectomy   Past Surgical History:  Procedure Laterality Date  . AMPUTATION  04/30/2011   Procedure: AMPUTATION FOOT;  Surgeon: Newt Minion, MD;  Location: [place];  Service: Orthopedics;  Laterality: Right;  Right Midfoot Amputation  . APPENDECTOMY     teenager  . COLONOSCOPY N/A 12/17/2014   Procedure: COLONOSCOPY;  Surgeon: Ladene Artist, MD;  Location: [facility] ENDOSCOPY;  Service: Endoscopy;  Laterality: N/A;  . ESOPHAGOGASTRODUODENOSCOPY N/A 12/15/2014   Procedure: ESOPHAGOGASTRODUODENOSCOPY (EGD);  Surgeon: Ladene Artist, MD;  Location: [facility] ENDOSCOPY;  Service: Endoscopy;  Laterality: N/A;  . EYE SURGERY     cat ext ou   Family History  Problem Relation Age of Onset  . Anemia Mother   . Colon cancer Mother     rectal  . HIV Brother   . Cancer Brother    Social History   Social History  . Marital status: Widowed    Spouse name: N/A  . Number of children: N/A  . Years of education: N/A   Occupational History  . Not on file.   Social History Main Topics  . Smoking  status: Never Smoker  . Smokeless tobacco: Never Used  . Alcohol use No  . Drug use: No  . Sexual activity: Not on file   Other Topics Concern  . Not on file   Social History Narrative  . No narrative on file    Review of Systems: Pertinent items noted in HPI and remainder of comprehensive ROS otherwise negative.  Physical Exam: Blood pressure (!) 94/47, pulse 97, temperature 98.1 F (36.7 C), temperature source Oral, resp. rate 18, height 5\' 6"  (1.676 m), weight 207 lb 11.2 oz (94.2 kg), SpO2 98 %.  General: Vital signs reviewed. Patient slightly hypotensive Cardiovascular: irregular rate, distant heart sounds, no murmurs appreciated Pulmonary/Chest: diffuse crackles on auscultation,  Abdominal: Soft, non-tender, non-distended, BS + Extremities: 2+ pitting edema to the knees., has bilateral transmetatarsal amputations.    Lab results: Results for orders placed or performed during the hospital encounter of 01/17/16 (from the past 24 hour(s))  POC occult blood, ED Provider will collect     Status: None   Collection Time: 01/17/16  6:32 PM  Result Value Ref Range   Fecal Occult Bld NEGATIVE NEGATIVE  CBC with Differential     Status: Abnormal   Collection Time: 01/17/16  8:03 PM  Result Value Ref Range   WBC 9.2 4.0 - 10.5 K/uL   RBC 3.91 3.87 - 5.11 MIL/uL   Hemoglobin 9.2 (L) 12.0 - 15.0 g/dL   HCT 30.5 (L) 36.0 - 46.0 %   MCV 78.0 78.0 - 100.0 fL   MCH 23.5 (L) 26.0 - 34.0 pg   MCHC 30.2 30.0 - 36.0 g/dL   RDW 20.1 (H) 11.5 - 15.5 %   Platelets 253 150 - 400 K/uL   Neutrophils Relative % 84 %   Neutro Abs 7.8 (H) 1.7 - 7.7 K/uL   Lymphocytes Relative 6 %   Lymphs Abs 0.5 (L) 0.7 - 4.0 K/uL   Monocytes Relative 10 %   Monocytes Absolute 0.9 0.1 - 1.0 K/uL   Eosinophils Relative 0 %   Eosinophils Absolute 0.0 0.0 - 0.7 K/uL   Basophils Relative 0 %   Basophils Absolute 0.0 0.0 - 0.1 K/uL  Comprehensive metabolic panel     Status: Abnormal   Collection Time:  01/17/16  8:03 PM  Result Value Ref Range   Sodium 139 135 - 145 mmol/L   Potassium 5.1 3.5 - 5.1 mmol/L   Chloride 113 (H) 101 -  111 mmol/L   CO2 14 (L) 22 - 32 mmol/L   Glucose, Bld 175 (H) 65 - 99 mg/dL   BUN 69 (H) 6 - 20 mg/dL   Creatinine, Ser 5.36 (H) 0.44 - 1.00 mg/dL   Calcium 8.3 (L) 8.9 - 10.3 mg/dL   Total Protein 7.2 6.5 - 8.1 g/dL   Albumin 3.5 3.5 - 5.0 g/dL   AST 36 15 - 41 U/L   ALT 18 14 - 54 U/L   Alkaline Phosphatase 117 38 - 126 U/L   Total Bilirubin 1.6 (H) 0.3 - 1.2 mg/dL   GFR calc non Af Amer 7 (L) >60 mL/min   GFR calc Af Amer 8 (L) >60 mL/min   Anion gap 12 5 - 15  Lipase, blood     Status: Abnormal   Collection Time: 01/17/16  8:03 PM  Result Value Ref Range   Lipase 65 (H) 11 - 51 U/L  Brain natriuretic peptide     Status: Abnormal   Collection Time: 01/17/16  8:03 PM  Result Value Ref Range   B Natriuretic Peptide 238.3 (H) 0.0 - 100.0 pg/mL  Urinalysis, Routine w reflex microscopic (not at [facility])     Status: Abnormal   Collection Time: 01/17/16 10:49 PM  Result Value Ref Range   Color, Urine AMBER (A) YELLOW   APPearance CLEAR CLEAR   Specific Gravity, Urine 1.018 1.005 - 1.030   pH 5.0 5.0 - 8.0   Glucose, UA NEGATIVE NEGATIVE mg/dL   Hgb urine dipstick NEGATIVE NEGATIVE   Bilirubin Urine MODERATE (A) NEGATIVE   Ketones, ur NEGATIVE NEGATIVE mg/dL   Protein, ur 30 (A) NEGATIVE mg/dL   Nitrite NEGATIVE NEGATIVE   Leukocytes, UA SMALL (A) NEGATIVE  Urine microscopic-add on     Status: Abnormal   Collection Time: 01/17/16 10:49 PM  Result Value Ref Range   Squamous Epithelial / LPF 0-5 (A) NONE SEEN   WBC, UA 0-5 0 - 5 WBC/hpf   RBC / HPF 0-5 0 - 5 RBC/hpf   Bacteria, UA FEW (A) NONE SEEN  I-Stat Troponin, ED (not at [facility])     Status: None   Collection Time: 01/17/16 11:28 PM  Result Value Ref Range   Troponin i, poc 0.02 0.00 - 0.08 ng/mL   Comment 3          I-Stat CG4 Lactic Acid, ED     Status: None   Collection Time: 01/17/16 11:30  PM  Result Value Ref Range   Lactic Acid, Venous 1.43 0.5 - 1.9 mmol/L  MRSA PCR Screening     Status: None   Collection Time: 01/18/16  3:11 AM  Result Value Ref Range   MRSA by PCR NEGATIVE NEGATIVE  Glucose, capillary     Status: None   Collection Time: 01/18/16  3:13 AM  Result Value Ref Range   Glucose-Capillary 91 65 - 99 mg/dL   Comment 1 Notify RN    Comment 2 Document in Chart   Basic metabolic panel     Status: Abnormal   Collection Time: 01/18/16  4:55 AM  Result Value Ref Range   Sodium 141 135 - 145 mmol/L   Potassium 5.0 3.5 - 5.1 mmol/L   Chloride 117 (H) 101 - 111 mmol/L   CO2 11 (L) 22 - 32 mmol/L   Glucose, Bld 86 65 - 99 mg/dL   BUN 60 (H) 6 - 20 mg/dL   Creatinine, Ser 5.44 (H) 0.44 - 1.00 mg/dL  Calcium 8.1 (L) 8.9 - 10.3 mg/dL   GFR calc non Af Amer 7 (L) >60 mL/min   GFR calc Af Amer 8 (L) >60 mL/min   Anion gap 13 5 - 15  CBC     Status: Abnormal   Collection Time: 01/18/16  4:55 AM  Result Value Ref Range   WBC 8.3 4.0 - 10.5 K/uL   RBC 3.66 (L) 3.87 - 5.11 MIL/uL   Hemoglobin 8.5 (L) 12.0 - 15.0 g/dL   HCT 29.0 (L) 36.0 - 46.0 %   MCV 79.2 78.0 - 100.0 fL   MCH 23.2 (L) 26.0 - 34.0 pg   MCHC 29.3 (L) 30.0 - 36.0 g/dL   RDW 20.3 (H) 11.5 - 15.5 %   Platelets 209 150 - 400 K/uL  Glucose, capillary     Status: None   Collection Time: 01/18/16  6:42 AM  Result Value Ref Range   Glucose-Capillary 76 65 - 99 mg/dL   Comment 1 Notify RN    Comment 2 Document in Chart   Glucose, capillary     Status: None   Collection Time: 01/18/16  7:52 AM  Result Value Ref Range   Glucose-Capillary 88 65 - 99 mg/dL  Glucose, capillary     Status: Abnormal   Collection Time: 01/18/16 11:48 AM  Result Value Ref Range   Glucose-Capillary 119 (H) 65 - 99 mg/dL     Imaging results:  Ct Abdomen Pelvis Wo Contrast  Result Date: 01/17/2016 CLINICAL DATA:  Rectal pain for 1 day, worse with sitting. EXAM: CT ABDOMEN AND PELVIS WITHOUT CONTRAST TECHNIQUE:  Multidetector CT imaging of the abdomen and pelvis was performed following the standard protocol without IV contrast. COMPARISON:  Ultrasound 02/01/2013 FINDINGS: There is hyperdense layering material within the gallbladder lumen, possibly milk of calcium. Discrete calculi are not visible. No bile duct dilatation. Unremarkable unenhanced appearances of the liver. There are unremarkable unenhanced appearances of the pancreas, spleen and adrenals. Kidneys appear mildly atrophic but are otherwise unremarkable. Collecting systems and ureters are unremarkable. Urinary bladder exhibits mild uniform mural thickening which could represent cystitis although it may also be related to the incomplete urinary bladder distention. There are unremarkable appearances of the stomach, small bowel and colon except for mild uncomplicated diverticulosis. The normal caliber aorta is heavily calcified. There are multiple calcified uterine fibroids measuring up to about 2.7 cm. No adnexal mass is evident. No focal acute inflammatory changes are evident in the abdomen or pelvis. There is a small volume ascites collected in the left adnexal region. There is a fat containing umbilical hernia In the lower chest, there is a moderate left pleural effusion which is incompletely imaged. There are posterior lung base opacities which are incompletely imaged but more likely represent benign scarring or atelectasis. No significant skeletal lesion is evident. IMPRESSION: 1. No acute inflammatory changes are evident in the abdomen or pelvis. No bowel obstruction or perforation. No perianal or perirectal abscess to account for the rectal pain. 2. Multiple calcified uterine fibroids measuring up to about 2.7 cm. 3. Hyperdense layering material within the gallbladder lumen, possibly milk of calcium bile. 4. Fat containing umbilical hernia. 5. Moderate left pleural effusion, incompletely imaged. Electronically Signed   By: Andreas Newport M.D.   On:  01/17/2016 21:25   Dg Abdomen Acute W/chest  Result Date: 01/17/2016 CLINICAL DATA:  Rectal pain for 1 day. EXAM: DG ABDOMEN ACUTE W/ 1V CHEST COMPARISON:  December 12, 2014 FINDINGS: There is no evidence of  dilated bowel loops or free intraperitoneal air. No radiopaque calculi or other significant radiographic abnormality is seen. Degenerative joint changes of the spine and scoliosis of the spine are identified. Images of the lungs demonstrate patchy consolidation of left lung base. There bilateral pleural effusions. There is increased central pulmonary vessel caliber. The heart size is enlarged. IMPRESSION: No bowel obstruction or free air. Bilateral pleural effusions. Patchy consolidation of left lung base, underlying pneumonia is not excluded. Central pulmonary vascular congestion with cardiomegaly. Electronically Signed   By: Abelardo Diesel M.D.   On: 01/17/2016 18:54    Assessment, Plan, & Recommendations by Problem: Principal Problem:   Acute on chronic renal failure (HCC) Active Problems:   Hypothyroidism   Diabetes mellitus with neuropathy (HCC)   Atrial fibrillation (HCC)  Acute on chronic renal failure : Patient presents with progression of her renal failure, likely cardiorenal with volume overload. She has baseline creatinine of around 3, likely from her existing DM, and MGUS vs MM, and her creatinine has been 5.4. Her chronic afib likely contributing to the progression of her Afib due to hypoperfusion.  - agree with trial of pressor support and IV diuresis and transfer to SDU -Trial of dialysis will be of unclear benefit to the patient, and do not recommend at this moment.  We explained to the patient and her daughter that dialysis is a full term commitment, and that may not prolong life expectancy or increase quality of life -strict I&O -monitor by foley -daily BMET -avoid NSAIDs, and hold home eliquis, and hold home acei   Signed: [name], MD 01/18/2016, 3:27 PM  Nephrology  Service    ATTESTATION:    I saw the patient with the resident and agree with the findings as documented in the resident's note.  Briefly, 19F with Afib with RVR, HFrEF 35-40%, and CKD G3 presenting with hypotension and acute on chronic kidney injury. This may be all stemming from hypoperfusion from episodes of Afib with RVR in the setting of depressed EF .  Agree with transfer to stepdown and a trial of pressors with diuresis.  Prognosis is poor and I do not think she would be a good long-term dialysis candidate.  Consider palliative care involvement.  Madelon Lips MD [facility] pgr [phone] cell [phone]

## 2016-01-18 NOTE — Evaluation (Signed)
Physical Therapy Evaluation Patient Details Name: PEPSI BONESTEEL MRN: [id number] DOB: [birth year] Today's Date: 01/18/2016   History of Present Illness  81 yo female with onset of rectal pain was admitted and found to have renal failure, CHF, L pleural effusion, uterine fibroids.  PMHx:  A-fib, CVA, DM, B toe amputations with shoes fitted with orthotics (not here)  Clinical Impression  Pt is up to side of bed, pulse 108 -109 consistently with all movement, but cannot stand.  Her fatigue and discomfort are consistent, does report nursing has improved her situation considerably.  Will recommend the pt go to SNF as [place] is in [place] with minimal ADL assistance and will need to recover her gait and balance with acute therapy as much as possible to decrease her SNF time of stay.    Follow Up Recommendations SNF    Equipment Recommendations  None recommended by PT    Recommendations for Other Services Rehab consult     Precautions / Restrictions Precautions Precautions: Fall (telemetry) Required Braces or Orthoses: Other Brace/Splint (requires orthotics in her shoes which daughter took home for) Other Brace/Splint: orthotics in shoes for toe amputations Restrictions Weight Bearing Restrictions: No      Mobility  Bed Mobility Overal bed mobility: Needs Assistance Bed Mobility: Supine to Sit;Sit to Supine     Supine to sit: Mod assist Sit to supine: Mod assist   General bed mobility comments: using bedrail briefly but tends to push off on L elbow to sit up on L side of bed  Transfers                 General transfer comment: declined as she feels too bad, tired  Ambulation/Gait             General Gait Details: declined to attempt and will need orthotic shoes  Stairs            Wheelchair Mobility    Modified Rankin (Stroke Patients Only) Modified Rankin (Stroke Patients Only) Pre-Morbid Rankin Score: Moderate disability Modified Rankin: Severe  disability     Balance Overall balance assessment: Needs assistance Sitting-balance support: Bilateral upper extremity supported Sitting balance-Leahy Scale: Poor                                       Pertinent Vitals/Pain Pain Assessment: Faces Faces Pain Scale: Hurts little more Pain Location: rectum  Pain Descriptors / Indicators: Sore Pain Intervention(s): Monitored during session;Repositioned;Premedicated before session    Home Living Family/patient expects to be discharged to:: Private residence Living Arrangements: Alone Available Help at Discharge: Family;Available PRN/intermittently;Personal care attendant Type of Home: Apartment Home Access: Level entry     Home Layout: One level Home Equipment: Walker - 2 wheels;Cane - single point Additional Comments: has help to do her bath and dressing    Prior Function Level of Independence: Needs assistance   Gait / Transfers Assistance Needed: RW in apt alone  ADL's / Homemaking Assistance Needed: has senior apt wth caregiver for bathing and dressing        Hand Dominance        Extremity/Trunk Assessment   Upper Extremity Assessment: Generalized weakness           Lower Extremity Assessment: Generalized weakness      Cervical / Trunk Assessment: Kyphotic  Communication   Communication: No difficulties  Cognition Arousal/Alertness: Lethargic Behavior During Therapy: Flat  affect Overall Cognitive Status: Within Functional Limits for tasks assessed                      General Comments General comments (skin integrity, edema, etc.): Has been in hospital for several days and now has become weaker, previously needed help to bathe and dress and now cannot stand    Exercises        Assessment/Plan    PT Assessment Patient needs continued PT services  PT Diagnosis Difficulty walking;Generalized weakness   PT Problem List Decreased strength;Decreased range of motion;Decreased  activity tolerance;Decreased balance;Decreased mobility;Decreased coordination;Decreased knowledge of use of DME;Decreased safety awareness;Cardiopulmonary status limiting activity;Decreased skin integrity;Pain  PT Treatment Interventions DME instruction;Gait training;Functional mobility training;Therapeutic activities;Therapeutic exercise;Balance training;Neuromuscular re-education;Patient/family education   PT Goals (Current goals can be found in the Care Plan section) Acute Rehab PT Goals Patient Stated Goal: to feel stronger PT Goal Formulation: With patient Time For Goal Achievement: 02/01/16 Potential to Achieve Goals: Good    Frequency Min 2X/week   Barriers to discharge Decreased caregiver support has partial day help for specific tasks only    Co-evaluation               End of Session   Activity Tolerance: Patient limited by fatigue;Patient limited by pain Patient left: in bed;with call bell/phone within reach;with bed alarm set Nurse Communication: Mobility status         Time: [id number] PT Time Calculation (min) (ACUTE ONLY): 13 min   Charges:   PT Evaluation $PT Eval Low Complexity: 1 Procedure     PT G CodesRamond Dial Jan 29, 2016, 10:19 AM    Mee Hives, PT MS Acute Rehab Dept. Number: [place]

## 2016-01-18 NOTE — Progress Notes (Signed)
txt paged Myra Rude MD notifying to chk VS taken at 0450 am BP 83/49. HR=93 . Continued to observe pt closely

## 2016-01-19 ENCOUNTER — Inpatient Hospital Stay (HOSPITAL_COMMUNITY): Payer: Medicare Other

## 2016-01-19 DIAGNOSIS — E1122 Type 2 diabetes mellitus with diabetic chronic kidney disease: Secondary | ICD-10-CM

## 2016-01-19 DIAGNOSIS — I482 Chronic atrial fibrillation, unspecified: Secondary | ICD-10-CM

## 2016-01-19 DIAGNOSIS — I5021 Acute systolic (congestive) heart failure: Secondary | ICD-10-CM

## 2016-01-19 DIAGNOSIS — I1310 Hypertensive heart and chronic kidney disease without heart failure, with stage 1 through stage 4 chronic kidney disease, or unspecified chronic kidney disease: Secondary | ICD-10-CM

## 2016-01-19 DIAGNOSIS — E8770 Fluid overload, unspecified: Secondary | ICD-10-CM

## 2016-01-19 DIAGNOSIS — E872 Acidosis: Secondary | ICD-10-CM

## 2016-01-19 DIAGNOSIS — E039 Hypothyroidism, unspecified: Secondary | ICD-10-CM

## 2016-01-19 DIAGNOSIS — I509 Heart failure, unspecified: Secondary | ICD-10-CM

## 2016-01-19 DIAGNOSIS — E875 Hyperkalemia: Secondary | ICD-10-CM

## 2016-01-19 DIAGNOSIS — Z794 Long term (current) use of insulin: Secondary | ICD-10-CM

## 2016-01-19 DIAGNOSIS — Z7189 Other specified counseling: Secondary | ICD-10-CM

## 2016-01-19 DIAGNOSIS — K6289 Other specified diseases of anus and rectum: Secondary | ICD-10-CM

## 2016-01-19 DIAGNOSIS — R600 Localized edema: Secondary | ICD-10-CM

## 2016-01-19 DIAGNOSIS — Z89421 Acquired absence of other right toe(s): Secondary | ICD-10-CM

## 2016-01-19 DIAGNOSIS — Z9119 Patient's noncompliance with other medical treatment and regimen: Secondary | ICD-10-CM

## 2016-01-19 DIAGNOSIS — I4891 Unspecified atrial fibrillation: Secondary | ICD-10-CM

## 2016-01-19 DIAGNOSIS — I130 Hypertensive heart and chronic kidney disease with heart failure and stage 1 through stage 4 chronic kidney disease, or unspecified chronic kidney disease: Secondary | ICD-10-CM

## 2016-01-19 DIAGNOSIS — Z89422 Acquired absence of other left toe(s): Secondary | ICD-10-CM

## 2016-01-19 LAB — PROTEIN / CREATININE RATIO, URINE
CREATININE, URINE: 48.57 mg/dL
Protein Creatinine Ratio: 0.31 mg/mg{Cre} — ABNORMAL HIGH (ref 0.00–0.15)
Total Protein, Urine: 15 mg/dL

## 2016-01-19 LAB — BASIC METABOLIC PANEL
ANION GAP: 14 (ref 5–15)
Anion gap: 10 (ref 5–15)
BUN: 62 mg/dL — ABNORMAL HIGH (ref 6–20)
BUN: 63 mg/dL — AB (ref 6–20)
CALCIUM: 7.9 mg/dL — AB (ref 8.9–10.3)
CALCIUM: 7.9 mg/dL — AB (ref 8.9–10.3)
CHLORIDE: 115 mmol/L — AB (ref 101–111)
CO2: 12 mmol/L — AB (ref 22–32)
CO2: 14 mmol/L — AB (ref 22–32)
CREATININE: 5.52 mg/dL — AB (ref 0.44–1.00)
CREATININE: 5.6 mg/dL — AB (ref 0.44–1.00)
Chloride: 113 mmol/L — ABNORMAL HIGH (ref 101–111)
GFR calc non Af Amer: 7 mL/min — ABNORMAL LOW (ref >60)
GFR calc non Af Amer: 7 mL/min — ABNORMAL LOW (ref >60)
GFR, EST AFRICAN AMERICAN: 8 mL/min — AB (ref >60)
GFR, EST AFRICAN AMERICAN: 8 mL/min — AB (ref >60)
Glucose, Bld: 85 mg/dL (ref 65–99)
Glucose, Bld: 133 mg/dL — ABNORMAL HIGH (ref 65–99)
Potassium: 5.5 mmol/L — ABNORMAL HIGH (ref 3.5–5.1)
Potassium: 5.1 mmol/L (ref 3.5–5.1)
SODIUM: 139 mmol/L (ref 135–145)
SODIUM: 139 mmol/L (ref 135–145)

## 2016-01-19 LAB — ECHOCARDIOGRAM COMPLETE
AV Area mean vel: 1.22 cm2
AV VEL mean LVOT/AV: 0.32
AV area mean vel ind: 0.56 cm2/m2
AV peak Index: 0.6
AV vel: 1.36
AVA: 1.36 cm2
AVAREAVTI: 1.3 cm2
AVAREAVTIIND: 0.63 cm2/m2
AVG: 10 mmHg
AVPG: 16 mmHg
AVPKVEL: 198 cm/s
Ao pk vel: 0.34 m/s
CHL CUP AV VALUE AREA INDEX: 0.63
CHL CUP MV DEC (S): 169
DOP CAL AO MEAN VELOCITY: 144 cm/s
E decel time: 169 msec
E/e' ratio: 14.21
FS: 17 % — AB (ref 28–44)
Height: 66 in
IV/PV OW: 0.98
LA diam index: 1.75 cm/m2
LA vol index: 28.3 mL/m2
LA vol: 61.5 mL
LASIZE: 38 mm
LAVOLA4C: 62.8 mL
LDCA: 3.8 cm2
LEFT ATRIUM END SYS DIAM: 38 mm
LV TDI E'MEDIAL: 7.58
LV e' LATERAL: 7.25 cm/s
LVEEAVG: 14.21
LVEEMED: 14.21
LVOT VTI: 12.2 cm
LVOT peak VTI: 0.36 cm
LVOT peak vel: 67.6 cm/s
LVOTD: 22 mm
LVOTSV: 46 mL
MVPG: 4 mmHg
MVPKEVEL: 103 m/s
PW: 12.8 mm — AB (ref 0.6–1.1)
RV TAPSE: 8.14 mm
Reg peak vel: 335 cm/s
TDI e' lateral: 7.25
TR max vel: 335 cm/s
VTI: 34.2 cm
WEIGHTICAEL: 3449.6 [oz_av]

## 2016-01-19 LAB — GLUCOSE, CAPILLARY
Glucose-Capillary: 77 mg/dL (ref 65–99)
Glucose-Capillary: 142 mg/dL — ABNORMAL HIGH (ref 65–99)
Glucose-Capillary: 152 mg/dL — ABNORMAL HIGH (ref 65–99)
Glucose-Capillary: 155 mg/dL — ABNORMAL HIGH (ref 65–99)

## 2016-01-19 LAB — CREATININE, URINE, RANDOM: CREATININE, URINE: 48.55 mg/dL

## 2016-01-19 LAB — TSH: TSH: 5.954 u[IU]/mL — ABNORMAL HIGH (ref 0.350–4.500)

## 2016-01-19 LAB — SODIUM, URINE, RANDOM: SODIUM UR: 97 mmol/L

## 2016-01-19 MED ORDER — ORAL CARE MOUTH RINSE
15.0000 mL | Freq: Two times a day (BID) | OROMUCOSAL | Status: DC
  Administered 2016-01-19 – 2016-01-25 (×13): 15 mL via OROMUCOSAL

## 2016-01-19 MED ORDER — AMIODARONE HCL IN DEXTROSE 360-4.14 MG/200ML-% IV SOLN
60.0000 mg/h | INTRAVENOUS | Status: AC
  Administered 2016-01-19 (×2): 60 mg/h via INTRAVENOUS
  Filled 2016-01-19: qty 200

## 2016-01-19 MED ORDER — AMIODARONE LOAD VIA INFUSION
150.0000 mg | Freq: Once | INTRAVENOUS | Status: AC
  Administered 2016-01-19: 150 mg via INTRAVENOUS
  Filled 2016-01-19: qty 83.34

## 2016-01-19 MED ORDER — AMIODARONE HCL 200 MG PO TABS: 400.0000 mg | ORAL_TABLET | Freq: Two times a day (BID) | ORAL | Status: DC

## 2016-01-19 MED ORDER — SODIUM POLYSTYRENE SULFONATE 15 GM/60ML PO SUSP
15.0000 g | Freq: Once | ORAL | Status: AC
  Administered 2016-01-19: 15 g via ORAL
  Filled 2016-01-19: qty 60

## 2016-01-19 MED ORDER — AMIODARONE HCL IN DEXTROSE 360-4.14 MG/200ML-% IV SOLN
30.0000 mg/h | INTRAVENOUS | Status: DC
  Administered 2016-01-19 – 2016-01-21 (×5): 30 mg/h via INTRAVENOUS
  Filled 2016-01-19 (×6): qty 200

## 2016-01-19 MED ORDER — SODIUM BICARBONATE 650 MG PO TABS
1300.0000 mg | ORAL_TABLET | Freq: Three times a day (TID) | ORAL | Status: DC
  Administered 2016-01-19 – 2016-01-25 (×20): 1300 mg via ORAL
  Filled 2016-01-19 (×20): qty 2

## 2016-01-19 MED ORDER — AMIODARONE HCL IN DEXTROSE 360-4.14 MG/200ML-% IV SOLN: 60.0000 mg/h | INTRAVENOUS | Status: DC

## 2016-01-19 MED ORDER — AMIODARONE HCL 200 MG PO TABS: 100.0000 mg | ORAL_TABLET | Freq: Every day | ORAL | Status: DC

## 2016-01-19 MED ORDER — FUROSEMIDE 10 MG/ML IJ SOLN
160.0000 mg | Freq: Two times a day (BID) | INTRAMUSCULAR | Status: DC
  Administered 2016-01-19 – 2016-01-23 (×8): 160 mg via INTRAVENOUS
  Filled 2016-01-19 (×9): qty 16

## 2016-01-19 MED ORDER — DEXTROSE 5 % IV SOLN
500.0000 mg | Freq: Once | INTRAVENOUS | Status: DC
  Filled 2016-01-19: qty 500

## 2016-01-19 NOTE — Progress Notes (Signed)
Subjective: Most bothersome symptom is still her rectal pain, which has improved somewhat.  No dizziness, palpitations, chest pain, or dyspnea.  Objective:  Vital signs in last 24 hours: Vitals:   01/18/16 2316 01/19/16 0424 01/19/16 0500 01/19/16 0736  BP: (!) 223/81 (!) 92/57  104/78  Pulse: (!) 110 (!) 102  (!) 120  Resp: 17 17  17   Temp: 97.6 F (36.4 C) 97.4 F (36.3 C)  98 F (36.7 C)  TempSrc: Oral Oral  Oral  SpO2: 100% 97%  95%  Weight:   215 lb 9.6 oz (97.8 kg)   Height:       Physical Exam  Constitutional: She is oriented to person, place, and time. No distress.  Cardiovascular:  Tachycardic, irregularly irregular rhythm, normal S1 and S2  Pulmonary/Chest:  Reduced breath sounds R lower and middle lung fields.  Rare crackles in L base.  Musculoskeletal:  BiLE mildly edematous to mid-shin Bilateral transmetatarsal amputations  Neurological: She is alert and oriented to person, place, and time.  Skin: Skin is warm and dry.  Psychiatric: She has a normal mood and affect. Her behavior is normal.    BMP Latest Ref Rng & Units 01/19/2016 01/18/2016 01/17/2016  Glucose 65 - 99 mg/dL 85 86 175(H)  BUN 6 - 20 mg/dL 62(H) 60(H) 69(H)  Creatinine 0.44 - 1.00 mg/dL 5.52(H) 5.44(H) 5.36(H)  BUN/Creat Ratio 11 - 26 - - -  Sodium 135 - 145 mmol/L 139 141 139  Potassium 3.5 - 5.1 mmol/L 5.5(H) 5.0 5.1  Chloride 101 - 111 mmol/L 113(H) 117(H) 113(H)  CO2 22 - 32 mmol/L 12(L) 11(L) 14(L)  Calcium 8.9 - 10.3 mg/dL 7.9(L) 8.1(L) 8.3(L)   Component     Latest Ref Rng & Units 01/19/2016  Sodium, Urine     mmol/L 97   Component     Latest Ref Rng & Units 01/19/2016  Creatinine, Urine     mg/dL 48.55    Component     Latest Ref Rng & Units 01/19/2016  Creatinine, Urine     mg/dL 48.57  Total Protein, Urine     mg/dL 15  Protein Creatinine Ratio     0.00 - 0.15 mg/mgCre 0.31 (H)    Component     Latest Ref Rng & Units 01/19/2016  TSH     0.350 - 4.500 uIU/mL 5.954 (H)    Overnight UOP 500 mL  Assessment/Plan:  Principal Problem:   Acute on chronic renal failure (HCC) Active Problems:   Hypothyroidism   Diabetes mellitus with neuropathy (HCC)   Atrial fibrillation ([place])  #Volume Overload #Hypotension Likely multifactorial, with contributions of renal failure and CHF exacerbation.  Heart Failure and Renal consulted, not felt to be a candidate for maintenance hemodialysis due to poor prognosis with multisystem disease.  Will try aggressive diuresis with pressor support as needed for hypotension.  UOP 500 mL overnight after 80 IV furosemide, bed weights stable. -Increase IV furosemide to 160 BID starting tonight -Add IV chlorothiazide 500 mg prior to furosemide -Follow I/Os and weights -Echo -Appreciate Heart Failure recommendations  #Acute on Chronic Renal Failure  #Acidosis Worsening her CKD, likely due to DM and MM/MGUS, with possible contribution of cardiorenal syndrome.  No nephrotic range proteinuria. Her acidosis is likely hyperchloremic metabolic acidosis from her renal failure.  She is not interested in hemodialysis, and is also not a good candidate for chronic hemodialysis.  Trial of aggressive diuresis, assess urine output via foley.  Borderline oliguric even after  diuresis. -Repeat BMP tomorrow -Appreciate Renal recommendations  #Atrial Fibrillation with RVR Likely exacerbating heart failure.  Rates remain elevated, 100s -120s.  Metoprolol prescribed at home for rate control, but reports noncompliance.  Given her hypotension and reported home medication noncompliance, beta blocker was not started at admission.  To avoid introducing potentially new negative inotropes in systolic heart failure, will try amiodarone for rate control. -IV amio load followed by 100 mg PO amio daily  #Hyperkalemia Rising K, up to 5.5 this morning.  With low urine output, loop diuretic may not help. -Kayexalate -Recheck K  #Goals of Care Discussed code status  with patient this morning.  She remains full code, but also espouses having had a good life, being ready to pass when it is her time, and not wanting to burden her family.  Encouraged her to talk with her family. -Continue discussion with patient and family -Consider palliative consult  #DM2 -SSI  #Hypothyroidism Slightly elevated TSH -Continue home levothyroxine for now  #Rectal Pain Symptomatic treatment for external hemorrhoids. -Hydrocortisone cream, lidocaine cream, Tucks  Dispo: Anticipated discharge in approximately 3 day(s).   Minus Liberty, MD 01/19/2016, 7:04 AM Pager: [phone]

## 2016-01-19 NOTE — Progress Notes (Signed)
Internal Medicine Attending  Date: 01/19/2016  Patient name: [name] Medical record number: [id number] Date of birth: [birth year] Age: 81 y.o. Gender: female  I saw and evaluated the patient. I reviewed the resident's note by Dr. Inda Castle and I agree with the resident's findings and plans as documented in his progress note.  When seen on rounds this morning [name] was pleasant and without complaints.  She specifically denied dyspnea or chest pain.  Exam is consistent with significant volume overload with pitting edema above her thighs.  She is only minimally net negative over the last 24 hours with the IV diuresis.  Renal function continues to slowly worsen and her a-fib is in the 120-130's.  We appreciate Cardiology and Nephrology's input.  We suspect she currently is experiencing the cardiorenal syndrome.  We will increase the IV lasix to 160 mg BID, give IV Amiodarone in hopes of getting some control of her rate, and continue to discuss goals of care as we move towards Palliative Care being her best option going forward.  Fortunately, she subjectively is feeling better despite our little objective progress.

## 2016-01-19 NOTE — Progress Notes (Signed)
  Amiodarone Drug - Drug Interaction Consult Note  Recommendations: Monitor any reports of muscle pain and electrolytes more closely  Amiodarone is metabolized by the cytochrome P450 system and therefore has the potential to cause many drug interactions. Amiodarone has an average plasma half-life of 50 days (range 20 to 100 days).   There is potential for drug interactions to occur several weeks or months after stopping treatment and the onset of drug interactions may be slow after initiating amiodarone.   [x]  Statins: Increased risk of myopathy. Simvastatin- restrict dose to 20mg  daily. Other statins: counsel patients to report any muscle pain or weakness immediately.  []  Anticoagulants: Amiodarone can increase anticoagulant effect. Consider warfarin dose reduction. Patients should be monitored closely and the dose of anticoagulant altered accordingly, remembering that amiodarone levels take several weeks to stabilize.  []  Antiepileptics: Amiodarone can increase plasma concentration of phenytoin, the dose should be reduced. Note that small changes in phenytoin dose can result in large changes in levels. Monitor patient and counsel on signs of toxicity.  []  Beta blockers: increased risk of bradycardia, AV block and myocardial depression. Sotalol - avoid concomitant use.  []   Calcium channel blockers (diltiazem and verapamil): increased risk of bradycardia, AV block and myocardial depression.  []   Cyclosporine: Amiodarone increases levels of cyclosporine. Reduced dose of cyclosporine is recommended.  []  Digoxin dose should be halved when amiodarone is started.  [x]  Diuretics: increased risk of cardiotoxicity if hypokalemia occurs.  []  Oral hypoglycemic agents (glyburide, glipizide, glimepiride): increased risk of hypoglycemia. Patient's glucose levels should be monitored closely when initiating amiodarone therapy.   []  Drugs that prolong the QT interval:  Torsades de pointes risk may be  increased with concurrent use - avoid if possible.  Monitor QTc, also keep magnesium/potassium WNL if concurrent therapy can't be avoided. Marland Kitchen Antibiotics: e.g. fluoroquinolones, erythromycin. . Antiarrhythmics: e.g. quinidine, procainamide, disopyramide, sotalol. . Antipsychotics: e.g. phenothiazines, haloperidol.  . Lithium, tricyclic antidepressants, and methadone.  Thank Mable Fill  01/19/2016 10:19 AM

## 2016-01-19 NOTE — Progress Notes (Addendum)
Advanced Heart Failure Rounding Note   Subjective:    Moved to SDU overnight. Foley placed and IV lasix started. Minimal u/o recorded. Weight going up.   HR remains in 120s SBP 104-130 today (was in 90s overnight). Creatinine up to 5.5. K 5.5   Denies CP or SOB. Renal following. Echo pending     Objective:   Weight Range:  Vital Signs:   Temp:  [97.3 F (36.3 C)-98.1 F (36.7 C)] 98 F (36.7 C) (09/02 0736) Pulse Rate:  [66-124] 124 (09/02 0900) Resp:  [10-21] 16 (09/02 0900) BP: (75-223)/(37-100) 130/100 (09/02 0900) SpO2:  [94 %-100 %] 99 % (09/02 0900) Weight:  [94.2 kg (207 lb 11.2 oz)-97.8 kg (215 lb 9.6 oz)] 97.8 kg (215 lb 9.6 oz) (09/02 0500) Last BM Date: 01/18/16  Weight change: Filed Weights   01/18/16 0314 01/18/16 1205 01/19/16 0500  Weight: 94.9 kg (209 lb 3.2 oz) 94.2 kg (207 lb 11.2 oz) 97.8 kg (215 lb 9.6 oz)    Intake/Output:   Intake/Output Summary (Last 24 hours) at 01/19/16 1013 Last data filed at 01/19/16 0900  Gross per 24 hour  Intake              360 ml  Output              500 ml  Net             -140 ml     Physical Exam: General:  Chronically ill and elderly appearing.  HEENT: normal occasionally closes eyes. Neck: supple. JVP to jaw or higher, difficult to assess with pt size. Carotids 2+ bilat; no bruits. No lymphadenopathy or thyromegaly appreciated. Cor: PMI nondisplaced. Irregular rate and rhythm, tachy.  Lungs: Diminished  Abdomen: Obese, NT, mild distended but soft, no HSM. No bruits or masses. +BS  Extremities: no cyanosis, clubbing, rash. 3+ edema up into thighs. Neuro: alert & orientedx3, cranial nerves grossly intact. moves all 4 extremities w/o difficulty. Affect pleasant  Telemetry:  AF 120s  Labs: Basic Metabolic Panel:  Recent Labs Lab 01/17/16 2003 01/18/16 0455 01/19/16 0328  NA 139 141 139  K 5.1 5.0 5.5*  CL 113* 117* 113*  CO2 14* 11* 12*  GLUCOSE 175* 86 85  BUN 69* 60* 62*  CREATININE 5.36*  5.44* 5.52*  CALCIUM 8.3* 8.1* 7.9*    Liver Function Tests:  Recent Labs Lab 01/17/16 2003  AST 36  ALT 18  ALKPHOS 117  BILITOT 1.6*  PROT 7.2  ALBUMIN 3.5    Recent Labs Lab 01/17/16 2003  LIPASE 65*   No results for input(s): AMMONIA in the last 168 hours.  CBC:  Recent Labs Lab 01/17/16 2003 01/18/16 0455  WBC 9.2 8.3  NEUTROABS 7.8*  --   HGB 9.2* 8.5*  HCT 30.5* 29.0*  MCV 78.0 79.2  PLT 253 209    Cardiac Enzymes: No results for input(s): CKTOTAL, CKMB, CKMBINDEX, TROPONINI in the last 168 hours.  BNP: BNP (last 3 results)  Recent Labs  01/17/16 2003  BNP 238.3*    ProBNP (last 3 results) No results for input(s): PROBNP in the last 8760 hours.    Other results:  Imaging: Ct Abdomen Pelvis Wo Contrast  Result Date: 01/17/2016 CLINICAL DATA:  Rectal pain for 1 day, worse with sitting. EXAM: CT ABDOMEN AND PELVIS WITHOUT CONTRAST TECHNIQUE: Multidetector CT imaging of the abdomen and pelvis was performed following the standard protocol without IV contrast. COMPARISON:  Ultrasound 02/01/2013 FINDINGS: There  is hyperdense layering material within the gallbladder lumen, possibly milk of calcium. Discrete calculi are not visible. No bile duct dilatation. Unremarkable unenhanced appearances of the liver. There are unremarkable unenhanced appearances of the pancreas, spleen and adrenals. Kidneys appear mildly atrophic but are otherwise unremarkable. Collecting systems and ureters are unremarkable. Urinary bladder exhibits mild uniform mural thickening which could represent cystitis although it may also be related to the incomplete urinary bladder distention. There are unremarkable appearances of the stomach, small bowel and colon except for mild uncomplicated diverticulosis. The normal caliber aorta is heavily calcified. There are multiple calcified uterine fibroids measuring up to about 2.7 cm. No adnexal mass is evident. No focal acute inflammatory changes  are evident in the abdomen or pelvis. There is a small volume ascites collected in the left adnexal region. There is a fat containing umbilical hernia In the lower chest, there is a moderate left pleural effusion which is incompletely imaged. There are posterior lung base opacities which are incompletely imaged but more likely represent benign scarring or atelectasis. No significant skeletal lesion is evident. IMPRESSION: 1. No acute inflammatory changes are evident in the abdomen or pelvis. No bowel obstruction or perforation. No perianal or perirectal abscess to account for the rectal pain. 2. Multiple calcified uterine fibroids measuring up to about 2.7 cm. 3. Hyperdense layering material within the gallbladder lumen, possibly milk of calcium bile. 4. Fat containing umbilical hernia. 5. Moderate left pleural effusion, incompletely imaged. Electronically Signed   By: Andreas Newport M.D.   On: 01/17/2016 21:25   Dg Abdomen Acute W/chest  Result Date: 01/17/2016 CLINICAL DATA:  Rectal pain for 1 day. EXAM: DG ABDOMEN ACUTE W/ 1V CHEST COMPARISON:  December 12, 2014 FINDINGS: There is no evidence of dilated bowel loops or free intraperitoneal air. No radiopaque calculi or other significant radiographic abnormality is seen. Degenerative joint changes of the spine and scoliosis of the spine are identified. Images of the lungs demonstrate patchy consolidation of left lung base. There bilateral pleural effusions. There is increased central pulmonary vessel caliber. The heart size is enlarged. IMPRESSION: No bowel obstruction or free air. Bilateral pleural effusions. Patchy consolidation of left lung base, underlying pneumonia is not excluded. Central pulmonary vascular congestion with cardiomegaly. Electronically Signed   By: Abelardo Diesel M.D.   On: 01/17/2016 18:54      Medications:     Scheduled Medications: . amiodarone  150 mg Intravenous Once  . amiodarone  100 mg Oral Daily  . atorvastatin  40 mg  Oral Daily  . furosemide  160 mg Intravenous BID  . heparin  5,000 Units Subcutaneous Q8H  . hydrocortisone  1 application Rectal BID  . insulin aspart  0-9 Units Subcutaneous TID WC  . levothyroxine  112 mcg Oral QAC breakfast  . mouth rinse  15 mL Mouth Rinse BID  . pantoprazole  40 mg Oral Daily  . sodium bicarbonate  1,300 mg Oral TID  . sodium polystyrene  15 g Oral Once     Infusions: . amiodarone       PRN Medications:  acetaminophen **OR** acetaminophen, lidocaine, polyethylene glycol, senna-docusate, witch hazel-glycerin   Assessment:   1. Acute on chronic systolic CHF (LVEF 123456 09/2014) 2. 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 Chronic Afib - Acute RVR 4. Hx of CVA 5. HTN 6. HLD 7. MGUS vs MM 8. Chronic anemia with scheduled transfusions/EPO.   9. T2DM 10. Hypotension  Plan/Discussion:    BP improved but has had  minimal response to IV lasix. Renal function continues to worsen.  Suspect renal decline is combination of natural progression of her renal failure and probably some hypoperfusion in setting of HF and AF with RVR. That said her AF is chronic and suspect her underlying illness may be driving RVR in part. BP too low for b-blockade. Will start po amio to help with rate control. Await echo to reassess EF - I would not be surprised if EF worse than before. Agree with increase in lasix dosing.   Suspect we are nearing a point where palliative care may be best option.   Noriko Macari,MD 10:17 AM  Advanced Heart Failure Team Pager [phone] (M-F; 7a - 4p)  Please contact [facility] for night-coverage after hours (4p -7a ) and weekends on [email or web address]  Addendum:  Patient started on IV amio per primary team. I think this is fine. Can switch to po amio in a day or two. If BP unable to tolerate IV amio can switch to 400 po bid.   Arcenio Mullaly,MD 10:23 AM

## 2016-01-19 NOTE — Progress Notes (Signed)
  Echocardiogram 2D Echocardiogram has been performed.  Darlina Sicilian M 01/19/2016, 3:05 PM

## 2016-01-19 NOTE — Plan of Care (Signed)
Problem: Education: Goal: Knowledge of [facility] Education information/materials will improve Outcome: Progressing Discussed with patient and family about her plan of care. This discussion included the Afib and starting the patient on an amiodarone gtt to help reduce her heart rate, as well as discussing the lasix to help pull fluid off. Family and patient voiced understanding.  Problem: Safety: Goal: Ability to remain free from injury will improve Outcome: Progressing Patient always calls when she needs assistance, no impulsive behavior, and did not experience any injury during my shift.  Problem: Pain Managment: Goal: General experience of comfort will improve Outcome: Progressing No complaints of pain during this shift.  Problem: Physical Regulation: Goal: Ability to maintain clinical measurements within normal limits will improve Outcome: Progressing Patient heart rate was reduced from 120's to 140's , down to 100's to 110's.   Problem: Skin Integrity: Goal: Risk for impaired skin integrity will decrease Outcome: Progressing Patient skin kept clean and dry, and frequently turning patient to reduce risk of skin breakdown. No signs of new breakdown on my shift.  Problem: Fluid Volume: Goal: Ability to maintain a balanced intake and output will improve Outcome: Progressing Patient still with increased edema in thighs. Patient given order for increased lasix to help get the extra fluid off of her. Patient also given a fluid restriction for diet orders of 1500.  Problem: Bowel/Gastric: Goal: Will not experience complications related to bowel motility Outcome: Progressing Patient has had several bowel movements today, brought on by kayexalate.

## 2016-01-19 NOTE — Progress Notes (Signed)
[facility] Progress Note    Assessment/ Plan:   1.  Acute on chronic kidney injury, non-oliguric: baseline serum creatinine is in the 3s.  I believe this most recent insult is due to hypoperfusion in the setting of Afib with RVR, which has caused poor forward flow and ultimately some volume overload.  She has had labile blood pressures overnight as well which is contributing to her AKI.  It appears she is gently diuresing but I am concerned that if she continues to persist in RVR, she will face further ischemic insult to her kidneys which will put her at risk for oliguric kidney injury.  I am adding on K/L light chains and an SPEP to her AM labs to ensure that her MGUS has not progressed (I have a low suspicion based on myeloma labs from 8/22 in Epic).  I do not think that her Afib has caused an embolic phenomenon so I don't think renal dopplers are essential. For now, continue diuresis with Lasix 80 IV BID.  She is not a good hemodialysis candidate given age and more importantly frailty.  We will continue discussions regarding dialysis with the family.  I appreciate the primary team for their discussion with the patient this morning.  2.  Metabolic acidosis: recommend sodium bicarbonate 1300 PO TID.  Would avoid a gtt at this time.  3.  Hyperkalemia: K+ 5.5.  Bicarb will help with this.  Would avoid serving pt excess amount of potatoes, tomatoes, and orange juice on meal tray.  4.  Atrial fibrillation with RVR: contributing to volume overload and AKI as above.   Heart failure consulting.  I believe optimizing rate control may allow [place] to achieve greater diuresis, will defer to their care.    5.  Acute on chronic systolic heart failure with reduced EF 35-40%: TTE pending.    6.  MGUS: adding SPEP and serum light chains as above in #1.  She has declined bone marrow biopsy multiple times in the outpatient setting.  7.  S/p CVA 09/8097: likely embolic phenomenon from Afib with RVR.   Eliquis on hold due to AKI, and it may be that her renal function does not recover enough for her to go back on it.  Consider ASA 81 mg in the meantime.  8.  DM II- per primary  9.  Rectal pain: due to hemorrhoids, topical treatment per primary.  10.  HLD- on atorvastatin  Subjective:    Pt moved to stepdown overnight.  Foley inserted, gentle diuresis begun.  In Afib with RVR overnight with labile pressures.  She feels "OK" today.  I met the internal medicine resident in her room and was informed that they had a conversation this morning regarding dialysis; pt does not feel like she would want to pursue that option.     Objective:   BP 104/78   Pulse (!) 120   Temp 98 F (36.7 C) (Oral)   Resp 17   Ht 5' 6"  (1.676 m)   Wt 97.8 kg (215 lb 9.6 oz)   SpO2 95%   BMI 34.80 kg/m   Intake/Output Summary (Last 24 hours) at 01/19/16 0812 Last data filed at 01/19/16 0737  Gross per 24 hour  Intake              120 ml  Output              500 ml  Net             -  380 ml   Weight change: -0.68 kg (-1 lb 8 oz)  Physical Exam: [id number], laying in bed, NAD HEENT: slightly tacky MM , upper dentures in place NECK: minimal JVD at 30 degrees CVS: tachycardic, irregular, soft systolic murmur Resp:normal WOB, few crackles bilaterally, L > R and generally more muffled sounds in the bases BWL:SLHT, nondistended, nontender, NABS DSK:AJGO and well-perfused, bilateral trans-metatarsal amputations, 1+ LE edema which is improved.  Imaging: Ct Abdomen Pelvis Wo Contrast  Result Date: 01/17/2016 CLINICAL DATA:  Rectal pain for 1 day, worse with sitting. EXAM: CT ABDOMEN AND PELVIS WITHOUT CONTRAST TECHNIQUE: Multidetector CT imaging of the abdomen and pelvis was performed following the standard protocol without IV contrast. COMPARISON:  Ultrasound 02/01/2013 FINDINGS: There is hyperdense layering material within the gallbladder lumen, possibly milk of calcium. Discrete calculi are not visible. No  bile duct dilatation. Unremarkable unenhanced appearances of the liver. There are unremarkable unenhanced appearances of the pancreas, spleen and adrenals. Kidneys appear mildly atrophic but are otherwise unremarkable. Collecting systems and ureters are unremarkable. Urinary bladder exhibits mild uniform mural thickening which could represent cystitis although it may also be related to the incomplete urinary bladder distention. There are unremarkable appearances of the stomach, small bowel and colon except for mild uncomplicated diverticulosis. The normal caliber aorta is heavily calcified. There are multiple calcified uterine fibroids measuring up to about 2.7 cm. No adnexal mass is evident. No focal acute inflammatory changes are evident in the abdomen or pelvis. There is a small volume ascites collected in the left adnexal region. There is a fat containing umbilical hernia In the lower chest, there is a moderate left pleural effusion which is incompletely imaged. There are posterior lung base opacities which are incompletely imaged but more likely represent benign scarring or atelectasis. No significant skeletal lesion is evident. IMPRESSION: 1. No acute inflammatory changes are evident in the abdomen or pelvis. No bowel obstruction or perforation. No perianal or perirectal abscess to account for the rectal pain. 2. Multiple calcified uterine fibroids measuring up to about 2.7 cm. 3. Hyperdense layering material within the gallbladder lumen, possibly milk of calcium bile. 4. Fat containing umbilical hernia. 5. Moderate left pleural effusion, incompletely imaged. Electronically Signed   By: Andreas Newport M.D.   On: 01/17/2016 21:25   Dg Abdomen Acute W/chest  Result Date: 01/17/2016 CLINICAL DATA:  Rectal pain for 1 day. EXAM: DG ABDOMEN ACUTE W/ 1V CHEST COMPARISON:  December 12, 2014 FINDINGS: There is no evidence of dilated bowel loops or free intraperitoneal air. No radiopaque calculi or other significant  radiographic abnormality is seen. Degenerative joint changes of the spine and scoliosis of the spine are identified. Images of the lungs demonstrate patchy consolidation of left lung base. There bilateral pleural effusions. There is increased central pulmonary vessel caliber. The heart size is enlarged. IMPRESSION: No bowel obstruction or free air. Bilateral pleural effusions. Patchy consolidation of left lung base, underlying pneumonia is not excluded. Central pulmonary vascular congestion with cardiomegaly. Electronically Signed   By: Abelardo Diesel M.D.   On: 01/17/2016 18:54    Labs: BMET  Recent Labs Lab 01/17/16 2003 01/18/16 0455 01/19/16 0328  NA 139 141 139  K 5.1 5.0 5.5*  CL 113* 117* 113*  CO2 14* 11* 12*  GLUCOSE 175* 86 85  BUN 69* 60* 62*  CREATININE 5.36* 5.44* 5.52*  CALCIUM 8.3* 8.1* 7.9*   CBC  Recent Labs Lab 01/17/16 2003 01/18/16 0455  WBC 9.2 8.3  NEUTROABS 7.8*  --  HGB 9.2* 8.5*  HCT 30.5* 29.0*  MCV 78.0 79.2  PLT 253 209    Medications:    . atorvastatin  40 mg Oral Daily  . furosemide  80 mg Intravenous BID  . heparin  5,000 Units Subcutaneous Q8H  . hydrocortisone  1 application Rectal BID  . insulin aspart  0-9 Units Subcutaneous TID WC  . levothyroxine  112 mcg Oral QAC breakfast  . mouth rinse  15 mL Mouth Rinse BID  . pantoprazole  40 mg Oral Daily     Madelon Lips MD [facility]  pgr [phone] cell [phone] 01/19/2016, 8:12 AM

## 2016-01-20 DIAGNOSIS — I5021 Acute systolic (congestive) heart failure: Secondary | ICD-10-CM

## 2016-01-20 LAB — GLUCOSE, CAPILLARY
GLUCOSE-CAPILLARY: 228 mg/dL — AB (ref 65–99)
GLUCOSE-CAPILLARY: 156 mg/dL — AB (ref 65–99)
GLUCOSE-CAPILLARY: 138 mg/dL — AB (ref 65–99)
Glucose-Capillary: 153 mg/dL — ABNORMAL HIGH (ref 65–99)

## 2016-01-20 LAB — BASIC METABOLIC PANEL
Anion gap: 14 (ref 5–15)
BUN: 63 mg/dL — AB (ref 6–20)
CHLORIDE: 112 mmol/L — AB (ref 101–111)
CO2: 13 mmol/L — AB (ref 22–32)
CREATININE: 5.39 mg/dL — AB (ref 0.44–1.00)
Calcium: 7.7 mg/dL — ABNORMAL LOW (ref 8.9–10.3)
GFR calc Af Amer: 8 mL/min — ABNORMAL LOW (ref >60)
GFR calc non Af Amer: 7 mL/min — ABNORMAL LOW (ref >60)
GLUCOSE: 134 mg/dL — AB (ref 65–99)
Potassium: 4.5 mmol/L (ref 3.5–5.1)
SODIUM: 139 mmol/L (ref 135–145)

## 2016-01-20 MED ORDER — METOLAZONE 10 MG PO TABS
5.0000 mg | ORAL_TABLET | Freq: Once | ORAL | Status: AC
  Administered 2016-01-20: 5 mg via ORAL
  Filled 2016-01-20: qty 1

## 2016-01-20 NOTE — Progress Notes (Signed)
Internal Medicine Attending  Date: 01/20/2016  Patient name: [name] Medical record number: [id number] Date of birth: 01-12-1935 Age: 81 y.o. Gender: female  I saw and evaluated the patient. I reviewed the resident's note by Dr. Inda Castle and I agree with the resident's findings and plans as documented in his progress note.  Despite the increase in her Lasix her net urine output is minimal. That being said, her peripheral edema is slowly improving, particularly in her legs and thighs. Her renal function is unchanged but stable over the last 4 days despite the aggressive diuresis. Her heart rate is now in the 100 to 115 range on the amiodarone drip. Her blood pressures are slightly improved with our intervention. Therefore, despite minimal subjective improvement she subjectively is slightly improved. All of this does not change the underlying degree of her cardiorenal syndrome and end-stage congestive heart failure. We will be adding metolazone to her regimen in hopes of further diuresing her. We will also continue with the palliative care discussion with the patient and her family. Fortunately, she remains comfortable despite her diuretic resistance and volume overload.

## 2016-01-20 NOTE — Progress Notes (Signed)
Advanced Heart Failure Rounding Note   Subjective:    Moved to SDU overnight. Foley placed and IV lasix started.   Urine output has picked up some on high-dose lasix. Cr slightly improved. Weight stable/   HR now 100-110 on IV amio.   Weak. Denies CP or SOB.   Echo reviewed EF 35%. Moderate AS   Objective:   Weight Range:  Vital Signs:   Temp:  [97.5 F (36.4 C)-98.1 F (36.7 C)] 98.1 F (36.7 C) (09/03 0246) Pulse Rate:  [88-124] 110 (09/03 0700) Resp:  [0-24] 13 (09/03 0700) BP: (91-133)/(46-100) 103/86 (09/03 0700) SpO2:  [87 %-100 %] 93 % (09/03 0700) Weight:  [100.3 kg (221 lb 1.9 oz)] 100.3 kg (221 lb 1.9 oz) (09/03 0500) Last BM Date: 01/19/16  Weight change: Filed Weights   01/19/16 0500 01/19/16 2306 01/20/16 0500  Weight: 97.8 kg (215 lb 9.6 oz) 100.3 kg (221 lb 1.9 oz) 100.3 kg (221 lb 1.9 oz)    Intake/Output:   Intake/Output Summary (Last 24 hours) at 01/20/16 0835 Last data filed at 01/20/16 0700  Gross per 24 hour  Intake           1440.6 ml  Output             1050 ml  Net            390.6 ml     Physical Exam: General:  Chronically ill and elderly appearing.  HEENT: normal  Neck: supple. JVP to jaw  Cor: PMI nondisplaced. Irregular rate and rhythm, tachy. 2/6 SEM at RUSB  Lungs: Diminished  Abdomen: Obese, NT, mild distended but soft, no HSM. No bruits or masses. +BS  Extremities: no cyanosis, clubbing, rash. 2+ edema up into thighs. Neuro: alert & orientedx3, cranial nerves grossly intact. moves all 4 extremities w/o difficulty. Affect pleasant  Telemetry:  AF 100-110s  Labs: Basic Metabolic Panel:  Recent Labs Lab 01/17/16 2003 01/18/16 0455 01/19/16 0328 01/19/16 1144 01/20/16 0320  NA 139 141 139 139 139  K 5.1 5.0 5.5* 5.1 4.5  CL 113* 117* 113* 115* 112*  CO2 14* 11* 12* 14* 13*  GLUCOSE 175* 86 85 133* 134*  BUN 69* 60* 62* 63* 63*  CREATININE 5.36* 5.44* 5.52* 5.60* 5.39*  CALCIUM 8.3* 8.1* 7.9* 7.9* 7.7*     Liver Function Tests:  Recent Labs Lab 01/17/16 2003  AST 36  ALT 18  ALKPHOS 117  BILITOT 1.6*  PROT 7.2  ALBUMIN 3.5    Recent Labs Lab 01/17/16 2003  LIPASE 65*   No results for input(s): AMMONIA in the last 168 hours.  CBC:  Recent Labs Lab 01/17/16 2003 01/18/16 0455  WBC 9.2 8.3  NEUTROABS 7.8*  --   HGB 9.2* 8.5*  HCT 30.5* 29.0*  MCV 78.0 79.2  PLT 253 209    Cardiac Enzymes: No results for input(s): CKTOTAL, CKMB, CKMBINDEX, TROPONINI in the last 168 hours.  BNP: BNP (last 3 results)  Recent Labs  01/17/16 2003  BNP 238.3*    ProBNP (last 3 results) No results for input(s): PROBNP in the last 8760 hours.    Other results:  Imaging: No results found.   Medications:     Scheduled Medications: . atorvastatin  40 mg Oral Daily  . furosemide  160 mg Intravenous BID  . heparin  5,000 Units Subcutaneous Q8H  . hydrocortisone  1 application Rectal BID  . insulin aspart  0-9 Units Subcutaneous TID WC  .  levothyroxine  112 mcg Oral QAC breakfast  . mouth rinse  15 mL Mouth Rinse BID  . pantoprazole  40 mg Oral Daily  . sodium bicarbonate  1,300 mg Oral TID    Infusions: . amiodarone 30 mg/hr (01/19/16 2216)    PRN Medications: acetaminophen **OR** acetaminophen, lidocaine, polyethylene glycol, senna-docusate, witch hazel-glycerin   Assessment:   1. Acute on chronic systolic CHF (LVEF 123456 09/2014) 2. 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 Chronic Afib - Acute RVR 4. Hx of CVA 5. HTN 6. HLD 7. MGUS vs MM 8. Chronic anemia with scheduled transfusions/EPO.   9. T2DM 10. Hypotension  Plan/Discussion:    Urine output has picked up some on high dose lasix. Creatinine slightly better. Still volume overloaded. Continue attempts at diuresis. Can add metolazone if OK with Renal.   Echo reviewed personally. EF relatively stable at 35%. AS is moderate. Ventricular rate somewhat improved with IV amio. Will continue. No ACE/ARB/ARNI or  b-blocker with low BP and renal failure.   Overall minimal progress.  Suspect palliative care still may be best option.   Daniil Labarge,MD 8:35 AM  Advanced Heart Failure Team Pager [phone] (M-F; 7a - 4p)  Please contact [facility] for night-coverage after hours (4p -7a ) and weekends on [email or web address]

## 2016-01-20 NOTE — Plan of Care (Signed)
Problem: Activity: Goal: Risk for activity intolerance will decrease Outcome: Progressing Patient encouraged to turn self in bed with staff assistance. Patient family brought in the patient's prosthetic shoes to assist her in standing. At home patient uses shoes and a walker to get around.   Problem: Fluid Volume: Goal: Ability to maintain a balanced intake and output will improve Outcome: Progressing Patient continuing to be diuresed. Per MD, patient given zaroxolyn 30 minutes prior to lasix ivpb, to further encourage diuresis. Continuing strict I & O's with foley catheter in presence of aggressive IV diuresis.

## 2016-01-20 NOTE — Progress Notes (Signed)
[facility] Progress Note    Assessment/ Plan:   1.  Acute on chronic kidney injury, non-oliguric: baseline serum creatinine is in the 3s.  I believe this most recent insult is due to hypoperfusion in the setting of Afib with RVR, which has caused poor forward flow and ultimately some volume overload.  She has had labile blood pressures overnight as well which is contributing to her AKI.  It appears she is gently diuresing but I am concerned that if she continues to persist in RVR, she will face further ischemic insult to her kidneys which will put her at risk for oliguric kidney injury.  I am adding on K/L light chains and an SPEP to her AM labs to ensure that her MGUS has not progressed (I have a low suspicion based on myeloma labs from 8/22 in Epic).  I do not think that her Afib has caused an embolic phenomenon so I don't think renal dopplers are essential.  She is not a good hemodialysis candidate given age and more importantly frailty.  We will continue discussions regarding dialysis with the family as those issues arise.  Continue Lasix 160 IV BID and add metolazone 5 mg BID.    2.  Metabolic acidosis, improving slightly. Continue sodium bicarbonate 1300 PO TID.    3.  Hyperkalemia, resolved.  Peaked at 5.5.  Would avoid serving pt excess amount of potatoes, tomatoes, and orange juice on meal tray.  4.  Atrial fibrillation with RVR: contributing to volume overload and AKI as above.   Heart failure consulting.  I believe optimizing rate control may allow [place] to achieve greater diuresis, will defer to their care.    5.  Acute on chronic systolic heart failure with reduced EF 35-40%: TTE with EF 30-35%.  6.  MGUS: adding SPEP and serum light chains as above in #1.  She has declined bone marrow biopsy multiple times in the outpatient setting.  7.  S/p CVA 05/6107: likely embolic phenomenon from Afib with RVR.  Eliquis on hold due to AKI, and it may be that her renal function does  not recover enough for her to go back on it.  Consider ASA 81 mg in the meantime.  8.  DM II- per primary  9.  Rectal pain: due to hemorrhoids, topical treatment per primary.  10.  HLD- on atorvastatin  Subjective:    Good urine output but only slightly net negative.   Objective:   BP 97/75 (BP Location: Left Arm)   Pulse (!) 111   Temp 97.7 F (36.5 C) (Oral)   Resp 14   Ht 5' 6"  (1.676 m)   Wt 100.3 kg (221 lb 1.9 oz)   SpO2 94%   BMI 35.69 kg/m   Intake/Output Summary (Last 24 hours) at 01/20/16 0952 Last data filed at 01/20/16 0700  Gross per 24 hour  Intake           1080.6 ml  Output             1350 ml  Net           -269.4 ml   Weight change: 6.088 kg (13 lb 6.7 oz)  Physical Exam: UEA:VWUJWJX, laying in bed, NAD HEENT: slightly tacky MM , upper dentures in place NECK: minimal JVD at 30 degrees CVS: tachycardic, irregular, soft systolic murmur Resp:normal WOB, few crackles bilaterally, improved, L > R and generally more muffled sounds in the bases BJY:NWGN, nondistended, nontender, NABS FAO:ZHYQ and well-perfused,  bilateral trans-metatarsal amputations, 1+ LE edema to thighs which is mildly improved  Imaging: No results found.  Labs: BMET  Recent Labs Lab 01/17/16 2003 01/18/16 0455 01/19/16 0328 01/19/16 1144 01/20/16 0320  NA 139 141 139 139 139  K 5.1 5.0 5.5* 5.1 4.5  CL 113* 117* 113* 115* 112*  CO2 14* 11* 12* 14* 13*  GLUCOSE 175* 86 85 133* 134*  BUN 69* 60* 62* 63* 63*  CREATININE 5.36* 5.44* 5.52* 5.60* 5.39*  CALCIUM 8.3* 8.1* 7.9* 7.9* 7.7*   CBC  Recent Labs Lab 01/17/16 2003 01/18/16 0455  WBC 9.2 8.3  NEUTROABS 7.8*  --   HGB 9.2* 8.5*  HCT 30.5* 29.0*  MCV 78.0 79.2  PLT 253 209    Medications:    . atorvastatin  40 mg Oral Daily  . furosemide  160 mg Intravenous BID  . heparin  5,000 Units Subcutaneous Q8H  . hydrocortisone  1 application Rectal BID  . insulin aspart  0-9 Units Subcutaneous TID WC  .  levothyroxine  112 mcg Oral QAC breakfast  . mouth rinse  15 mL Mouth Rinse BID  . metolazone  5 mg Oral Once  . pantoprazole  40 mg Oral Daily  . sodium bicarbonate  1,300 mg Oral TID     Madelon Lips MD [facility]  pgr [phone] cell [phone] 01/20/2016, 9:52 AM

## 2016-01-20 NOTE — Progress Notes (Signed)
Subjective: Still having some rectal pain, but overall feels somewhat better.  Thinks the swelling in her legs has gone down.  No dizziness, palpitations, chest pain, or dyspnea.  Objective:  Vital signs in last 24 hours: Vitals:   01/20/16 0500 01/20/16 0600 01/20/16 0700 01/20/16 0800  BP: 113/82 (!) 110/59 103/86 97/75  Pulse: (!) 110 (!) 117 (!) 110 (!) 111  Resp: 16 16 13 14   Temp:    97.7 F (36.5 C)  TempSrc:    Oral  SpO2: 92% 95% 93% 94%  Weight: 221 lb 1.9 oz (100.3 kg)     Height:       Physical Exam  Constitutional: She is oriented to person, place, and time. No distress.  Cardiovascular:  Tachycardic, irregularly irregular rhythm, normal S1 and S2  Pulmonary/Chest:  Breath sounds mildly reduced in bilateral lung bases.  No crackles.  Musculoskeletal:  BiLE mildly edematous to mid-shin Neurological: She is alert and oriented to person, place, and time.  Skin: Skin is warm and dry.  Psychiatric: She has a normal mood and affect. Her behavior is normal.    BMP Latest Ref Rng & Units 01/20/2016 01/19/2016 01/19/2016  Glucose 65 - 99 mg/dL 134(H) 133(H) 85  BUN 6 - 20 mg/dL 63(H) 63(H) 62(H)  Creatinine 0.44 - 1.00 mg/dL 5.39(H) 5.60(H) 5.52(H)  BUN/Creat Ratio 11 - 26 - - -  Sodium 135 - 145 mmol/L 139 139 139  Potassium 3.5 - 5.1 mmol/L 4.5 5.1 5.5(H)  Chloride 101 - 111 mmol/L 112(H) 115(H) 113(H)  CO2 22 - 32 mmol/L 13(L) 14(L) 12(L)  Calcium 8.9 - 10.3 mg/dL 7.7(L) 7.9(L) 7.9(L)   BMP Latest Ref Rng & Units 01/20/2016 01/19/2016 01/19/2016  Glucose 65 - 99 mg/dL 134(H) 133(H) 85  BUN 6 - 20 mg/dL 63(H) 63(H) 62(H)  Creatinine 0.44 - 1.00 mg/dL 5.39(H) 5.60(H) 5.52(H)  BUN/Creat Ratio 11 - 26 - - -  Sodium 135 - 145 mmol/L 139 139 139  Potassium 3.5 - 5.1 mmol/L 4.5 5.1 5.5(H)  Chloride 101 - 111 mmol/L 112(H) 115(H) 113(H)  CO2 22 - 32 mmol/L 13(L) 14(L) 12(L)  Calcium 8.9 - 10.3 mg/dL 7.7(L) 7.9(L) 7.9(L)    Intake/Output Summary (Last 24 hours) at 01/20/16  1229 Last data filed at 01/20/16 0700  Gross per 24 hour  Intake           1080.6 ml  Output             1350 ml  Net           -269.4 ml   Echo 01/19/16: EF30-35% (35-40% in 09/2014)  Assessment/Plan:  Principal Problem:   Acute on chronic renal failure (HCC) Active Problems:   Hypothyroidism   Diabetes mellitus with neuropathy (HCC)   Atrial fibrillation (HCC)   Cardiorenal syndrome   Chronic atrial fibrillation (HCC)   Acute systolic congestive heart failure ([place])  #Volume Overload #Hypotension Likely multifactorial, with contributions of renal failure and CHF exacerbation.  Heart Failure and Renal consulted, not felt to be a candidate for maintenance hemodialysis due to poor prognosis with multisystem disease.  Will try aggressive diuresis with pressor support as needed for hypotension.  Moderate UOP on 80 mg IV furosemide, increased to 160 mg IV yesterday with little additional output.  She is urinating, but only slightly net negative. -Continue 160 mg iv furoseide BID  -Add 5 mg metolazone prior to furosemide this evening -Follow I/Os and weights -Heart Failure assisting with her CHF  and AF  #Acute on Chronic Renal Failure  #Acidosis Multifactorial with worsening CKD likely due to DM and MM/MGUS, with acute hypoperfusion due to AF RVR.  No nephrotic range proteinuria. Her acidosis is likely hyperchloremic metabolic acidosis from her renal failure.  Diuresing.   -Daily BMP -PO bicarb 1300 TID -Renal following  #Atrial Fibrillation with RVR Likely exacerbating heart failure.  Metoprolol prescribed at home for rate control, but reports noncompliance.  Given her hypotension and reported home medication noncompliance, beta blocker was not started at admission.  Rates somewhat improved 100s-110s on amiodarone infusion.   -Continue IV amiodarone infusion  #Hyperkalemia Resloved with 1x Kayexalate and diuresis. -Daily BMP  #Goals of Care Discussed code status with patient  this morning.  She remains full code, but also espouses having had a good life, being ready to pass when it is her time, and not wanting to burden her family.  Encouraged her to talk with her family. -Continue discussion with patient and family -Consider palliative consult  #DM2 -SSI  #Hypothyroidism Slightly elevated TSH -Continue home levothyroxine for now  #Rectal Pain Symptomatic treatment for external hemorrhoids. -Hydrocortisone cream, lidocaine cream, Tucks  Dispo: Anticipated discharge in approximately 3 day(s).   Minus Liberty, MD 01/20/2016, 12:18 PM Pager: [phone]

## 2016-01-21 LAB — BASIC METABOLIC PANEL
ANION GAP: 11 (ref 5–15)
BUN: 63 mg/dL — ABNORMAL HIGH (ref 6–20)
CALCIUM: 7.7 mg/dL — AB (ref 8.9–10.3)
CO2: 17 mmol/L — ABNORMAL LOW (ref 22–32)
Chloride: 110 mmol/L (ref 101–111)
Creatinine, Ser: 5.47 mg/dL — ABNORMAL HIGH (ref 0.44–1.00)
GFR, EST AFRICAN AMERICAN: 8 mL/min — AB (ref >60)
GFR, EST NON AFRICAN AMERICAN: 7 mL/min — AB (ref >60)
Glucose, Bld: 124 mg/dL — ABNORMAL HIGH (ref 65–99)
POTASSIUM: 4.3 mmol/L (ref 3.5–5.1)
SODIUM: 138 mmol/L (ref 135–145)

## 2016-01-21 LAB — GLUCOSE, CAPILLARY
Glucose-Capillary: 118 mg/dL — ABNORMAL HIGH (ref 65–99)
Glucose-Capillary: 186 mg/dL — ABNORMAL HIGH (ref 65–99)
Glucose-Capillary: 182 mg/dL — ABNORMAL HIGH (ref 65–99)
Glucose-Capillary: 154 mg/dL — ABNORMAL HIGH (ref 65–99)

## 2016-01-21 MED ORDER — METOLAZONE 10 MG PO TABS
5.0000 mg | ORAL_TABLET | Freq: Once | ORAL | Status: AC
  Administered 2016-01-21: 5 mg via ORAL
  Filled 2016-01-21: qty 1

## 2016-01-21 MED ORDER — METOPROLOL SUCCINATE ER 25 MG PO TB24
25.0000 mg | ORAL_TABLET | Freq: Every day | ORAL | Status: DC
  Administered 2016-01-21 – 2016-01-23 (×3): 25 mg via ORAL
  Filled 2016-01-21 (×3): qty 1

## 2016-01-21 MED ORDER — ASPIRIN EC 81 MG PO TBEC
81.0000 mg | DELAYED_RELEASE_TABLET | Freq: Every day | ORAL | Status: DC
  Administered 2016-01-21 – 2016-01-22 (×2): 81 mg via ORAL
  Filled 2016-01-21 (×2): qty 1

## 2016-01-21 NOTE — Clinical Social Work Note (Signed)
Clinical Social Work Assessment  Patient Details  Name: [name] MRN: [id number] Date of Birth: 03/20/1935  Date of referral:  01/21/16               Reason for consult:  Facility Placement                Permission sought to share information with:  Chartered certified accountant granted to share information::  Yes, Verbal Permission Granted  Name::     Music therapist::  SNF  Relationship::  dtr  Contact Information:     Housing/Transportation Living arrangements for the past 2 months:  Apartment Source of Information:  Patient, Adult Children Patient Interpreter Needed:  None Criminal Activity/Legal Involvement Pertinent to Current Situation/Hospitalization:  No - Comment as needed Significant Relationships:  Adult Children Lives with:    Do you feel safe going back to the place where you live?    Need for family participation in patient care:  Yes (Comment) (dtr helps with decision making)  Care giving concerns: does not have 24 hour assistance so if deconditioned would need SNF prior to return home.   Social Worker assessment / plan:  Pt reports that she does feel weaker and has been to SNF in the past- would be agreeable to return if needed- requests that CSW speak with her dtr [name] about plan.  [name] reports that pt did not have orthopedic shoes during PT eval so was unable to walk since she requires those shoes after her toe amputations.    Employment status:  Retired Nurse, adult PT Recommendations:  [place] / Referral to community resources:  [place]  Patient/Family's Response to care:  [name] agreeable to SNF if still being recommended after PT reevaluates  Patient/Family's Understanding of and Emotional Response to Diagnosis, Current Treatment, and Prognosis:  Unclear level of understanding at this time- planning for palliative care meeting to discuss prognosis and plan in  detail.  Emotional Assessment Appearance:  Appears stated age Attitude/Demeanor/Rapport:    Affect (typically observed):  Appropriate, Pleasant Orientation:  Oriented to Self, Oriented to Place, Oriented to  Time, Oriented to Situation Alcohol / Substance use:  Not Applicable Psych involvement (Current and /or in the community):  No (Comment)  Discharge Needs  Concerns to be addressed:  Care Coordination Readmission within the last 30 days:  No Current discharge risk:  Physical Impairment Barriers to Discharge:  Continued Medical Work up   Jorge Ny, LCSW 01/21/2016, 1:51 PM

## 2016-01-21 NOTE — Progress Notes (Signed)
OT Cancellation Note  Patient Details Name: [name] MRN: [id number] DOB: 02-21-1935   Cancelled Treatment:    Reason Eval/Treat Not Completed: Fatigue/lethargy limiting ability to participate.  Will try back.  [place], OTR/L [id number]   [name] 01/21/2016, 5:28 PM

## 2016-01-21 NOTE — Progress Notes (Signed)
Internal Medicine Attending  Date: 01/21/2016  Patient name: [name] Medical record number: [id number] Date of birth: 31-Dec-1934 Age: 81 y.o. Gender: female  I saw and evaluated the patient. I reviewed the resident's note by Dr. Inda Castle and I agree with the resident's findings and plans as documented in his progress note.  [name] remained in good spirits when she was seen on rounds this morning. She actually looked a little younger with her pigtails that her niece placed the day before because her hair looked too wild. She states she has not had any dyspnea or chest pain and is able to move her legs much easier now that the edema has improved. With the addition of metolazone to the high-dose IV Lasix we were able to get off approximately 1 L net negative yesterday. This also corresponded to an approximate 5 pound weight loss. Her renal function is essentially unchanged. She remains tachycardic with her atrial fibrillation despite the amiodarone drip and I appreciate cardiology's input and addition of metoprolol. We continue to make very minimal progress and she will be evaluated by the palliative care service tomorrow to help her and her family better set goals of care given her end-stage disease and lack of any further options.

## 2016-01-21 NOTE — Progress Notes (Addendum)
Advanced Heart Failure Rounding Note   Subjective:    Now on high-dose IV lasix. Po metolazone added yesterday. Diuresis improved. Weight down 5 pounds.   Creatinine stable. 5.39-> 5.47  HR now 100-120 on IV amio.   Weak. Denies CP or SOB.   Echo reviewed EF 35%. Moderate AS   Objective:   Weight Range:  Vital Signs:   Temp:  [97.5 F (36.4 C)-98.1 F (36.7 C)] 98.1 F (36.7 C) (09/04 0821) Pulse Rate:  [105-124] 105 (09/04 0800) Resp:  [13-20] 18 (09/04 0821) BP: (104-134)/(66-90) 117/75 (09/04 0821) SpO2:  [93 %-100 %] 96 % (09/04 0800) Weight:  [98.3 kg (216 lb 11.4 oz)] 98.3 kg (216 lb 11.4 oz) (09/04 0353) Last BM Date: 01/20/16  Weight change: Filed Weights   01/19/16 2306 01/20/16 0500 01/21/16 0353  Weight: 100.3 kg (221 lb 1.9 oz) 100.3 kg (221 lb 1.9 oz) 98.3 kg (216 lb 11.4 oz)    Intake/Output:   Intake/Output Summary (Last 24 hours) at 01/21/16 0858 Last data filed at 01/21/16 0800  Gross per 24 hour  Intake           1066.8 ml  Output             1976 ml  Net           -909.2 ml     Physical Exam: General:  Chronically ill and elderly appearing.  HEENT: normal  Neck: supple. JVP 8-9 Cor: PMI nondisplaced. Irregular rate and rhythm, tachy. 2/6 SEM at RUSB  Lungs: Diminished  Abdomen: Obese, NT, mild distended but soft, no HSM. No bruits or masses. +BS  Extremities: no cyanosis, clubbing, rash. Trace-1+ edema up into thighs. Neuro: alert & orientedx3, cranial nerves grossly intact. moves all 4 extremities w/o difficulty. Affect pleasant  Telemetry:  AF 100-120s  Labs: Basic Metabolic Panel:  Recent Labs Lab 01/18/16 0455 01/19/16 0328 01/19/16 1144 01/20/16 0320 01/21/16 0329  NA 141 139 139 139 138  K 5.0 5.5* 5.1 4.5 4.3  CL 117* 113* 115* 112* 110  CO2 11* 12* 14* 13* 17*  GLUCOSE 86 85 133* 134* 124*  BUN 60* 62* 63* 63* 63*  CREATININE 5.44* 5.52* 5.60* 5.39* 5.47*  CALCIUM 8.1* 7.9* 7.9* 7.7* 7.7*    Liver Function  Tests:  Recent Labs Lab 01/17/16 2003  AST 36  ALT 18  ALKPHOS 117  BILITOT 1.6*  PROT 7.2  ALBUMIN 3.5    Recent Labs Lab 01/17/16 2003  LIPASE 65*   No results for input(s): AMMONIA in the last 168 hours.  CBC:  Recent Labs Lab 01/17/16 2003 01/18/16 0455  WBC 9.2 8.3  NEUTROABS 7.8*  --   HGB 9.2* 8.5*  HCT 30.5* 29.0*  MCV 78.0 79.2  PLT 253 209    Cardiac Enzymes: No results for input(s): CKTOTAL, CKMB, CKMBINDEX, TROPONINI in the last 168 hours.  BNP: BNP (last 3 results)  Recent Labs  01/17/16 2003  BNP 238.3*    ProBNP (last 3 results) No results for input(s): PROBNP in the last 8760 hours.    Other results:  Imaging: No results found.   Medications:     Scheduled Medications: . atorvastatin  40 mg Oral Daily  . furosemide  160 mg Intravenous BID  . heparin  5,000 Units Subcutaneous Q8H  . hydrocortisone  1 application Rectal BID  . insulin aspart  0-9 Units Subcutaneous TID WC  . levothyroxine  112 mcg Oral QAC breakfast  .  mouth rinse  15 mL Mouth Rinse BID  . pantoprazole  40 mg Oral Daily  . sodium bicarbonate  1,300 mg Oral TID    Infusions: . amiodarone 30 mg/hr (01/20/16 2101)    PRN Medications: acetaminophen **OR** acetaminophen, lidocaine, polyethylene glycol, senna-docusate, witch hazel-glycerin   Assessment:   1. Acute on chronic systolic CHF (LVEF 123456 09/2014) 2. 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 Chronic Afib - Acute RVR 4. Hx of CVA 5. HTN 6. HLD 7. MGUS vs MM 8. Chronic anemia with scheduled transfusions/EPO.   9. T2DM 10. Hypotension  Plan/Discussion:    Urine output has picked up on high dose lasix and metolazone. Volume status improved. Creatinine stable.  Renal adjusting diuretics.  AF rate is improved on IV amio but still elevated. Will continue IV amio. BP is improved so can consider low-dose b-blocker as BP tolerates. Will add Toprol 25.  Echo reviewed personally. EF relatively stable at 35%. AS  is moderate. No ACE/ARB/ARNI with renal failure.   Overall minimal progress.  Suspect palliative care is best option.   Briunna Leicht,MD 8:58 AM  Advanced Heart Failure Team Pager [phone] (M-F; 7a - 4p)  Please contact [facility] for night-coverage after hours (4p -7a ) and weekends on [email or web address]

## 2016-01-21 NOTE — NC FL2 (Signed)
[place] MEDICAID FL2 LEVEL OF CARE SCREENING TOOL     IDENTIFICATION  Patient Name: [name] Birthdate: October 20, 1934 Sex: female Admission Date (Current Location): 01/17/2016  [facility] and [place] Number:  Herbalist and Address:  [facility] [place] [place], [place] [address] [place], [place] [address]      Provider Number: [id number]  Attending Physician Name and Address:  Axel Filler, MD  Relative Name and Phone Number:       Current Level of Care: Hospital Recommended Level of Care: [place] Prior Approval Number:    Date Approved/Denied:   PASRR Number: [id number]  Discharge Plan: SNF    Current Diagnoses: Patient Active Problem List   Diagnosis Date Noted  . Cardiorenal syndrome   . Chronic atrial fibrillation ([place])   . Acute systolic congestive heart failure ([place])   . Renal failure (ARF), acute on chronic (HCC) 01/18/2016  . Acute on chronic renal failure ([place]) 01/18/2016  . Papular rash 09/19/2015  . Ulcer of left lower extremity ([place]) 09/19/2015  . Benign neoplasm of transverse colon 12/17/2014  . Gastritis   . Melena 12/12/2014  . UGI bleed 12/12/2014  . Absolute anemia   . CVA (cerebral vascular accident) ([place]) 10/01/2014  . TIA (transient ischemic attack) 09/30/2014  . Arthralgia of both hands 08/04/2014  . Preventative health care 05/03/2014  . Facet arthropathy of spine 04/26/2014  . Vitamin D deficiency 11/03/2012  . External hemorrhoids 06/28/2012  . Intermittent sleepiness 06/28/2012  . Anemia associated with chronic renal failure 04/03/2011  . MGUS (monoclonal gammopathy of unknown significance) 04/03/2011  . Atrial fibrillation ([place]) 01/24/2011  . History of osteomyelitis 09/05/2009  . Hypothyroidism 02/01/2007  . Diabetes mellitus with neuropathy ([place]) 07/03/2006  . CKD (chronic kidney disease) stage 4, GFR 15-29 ml/min (HCC) 07/03/2006  . Hyperlipidemia 03/02/2006  . Essential hypertension  03/02/2006  . VENOUS INSUFFICIENCY 03/02/2006  . EDEMA LEG 03/02/2006  . History of amputation of lesser toe ([place]) 03/02/2006    Orientation RESPIRATION BLADDER Height & Weight     Self, Time, Situation, Place  Normal Indwelling catheter Weight: 98.3 kg (216 lb 11.4 oz) Height:  5\' 6"  (167.6 cm)  BEHAVIORAL SYMPTOMS/MOOD NEUROLOGICAL BOWEL NUTRITION STATUS      Continent Diet (cardiac, fluid restriction)  AMBULATORY STATUS COMMUNICATION OF NEEDS Skin   Limited Assist Verbally Other (Comment) (diabetic ulcer)                       Personal Care Assistance Level of Assistance  Bathing, Dressing Bathing Assistance: Limited assistance   Dressing Assistance: Limited assistance     Functional Limitations Info             SPECIAL CARE FACTORS FREQUENCY  PT (By licensed PT), OT (By licensed OT)     PT Frequency: 5/wk OT Frequency: 5/wk            Contractures      Additional Factors Info  Code Status, Allergies, Insulin Sliding Scale, Isolation Precautions Code Status Info: FULL Allergies Info: NKA   Insulin Sliding Scale Info: 3/wk Isolation Precautions Info: MRSA     Current Medications (01/21/2016):  This is the current hospital active medication list Current Facility-Administered Medications  Medication Dose Route Frequency Provider Last Rate Last Dose  . acetaminophen (TYLENOL) tablet 650 mg  650 mg Oral Q6H PRN Maryellen Pile, MD       Or  . acetaminophen (TYLENOL) suppository 650  mg  650 mg Rectal Q6H PRN Maryellen Pile, MD      . amiodarone (NEXTERONE PREMIX) 360-4.14 MG/200ML-% (1.8 mg/mL) IV infusion  30 mg/hr Intravenous Continuous Shaune Pascal Bensimhon, MD 16.7 mL/hr at 01/21/16 1024 30 mg/hr at 01/21/16 1024  . aspirin EC tablet 81 mg  81 mg Oral Daily Alexa Angela Burke, MD   81 mg at 01/21/16 1026  . atorvastatin (LIPITOR) tablet 40 mg  40 mg Oral Daily Maryellen Pile, MD   40 mg at 01/21/16 0851  . furosemide (LASIX) 160 mg in dextrose 5 % 50 mL IVPB   160 mg Intravenous BID Minus Liberty, MD   160 mg at 01/21/16 [id number]  . heparin injection 5,000 Units  5,000 Units Subcutaneous Q8H Maryellen Pile, MD   5,000 Units at 01/21/16 1347  . hydrocortisone (ANUSOL-HC) 2.5 % rectal cream 1 application  1 application Rectal BID Maryellen Pile, MD   1 application at [id number] [phone]  . insulin aspart (novoLOG) injection 0-9 Units  0-9 Units Subcutaneous TID WC Maryellen Pile, MD   2 Units at 01/21/16 1336  . levothyroxine (SYNTHROID, LEVOTHROID) tablet 112 mcg  112 mcg Oral QAC breakfast Maryellen Pile, MD   112 mcg at 01/21/16 0542  . lidocaine (LMX) 4 % cream   Topical BID PRN Maryellen Pile, MD      . MEDLINE mouth rinse  15 mL Mouth Rinse BID Axel Filler, MD   15 mL at 01/21/16 [phone]  . metolazone (ZAROXOLYN) tablet 5 mg  5 mg Oral Once Minus Liberty, MD      . metoprolol succinate (TOPROL-XL) 24 hr tablet 25 mg  25 mg Oral Daily Jolaine Artist, MD   25 mg at 01/21/16 1026  . pantoprazole (PROTONIX) EC tablet 40 mg  40 mg Oral Daily Maryellen Pile, MD   40 mg at 01/21/16 0852  . polyethylene glycol (MIRALAX / GLYCOLAX) packet 17 g  17 g Oral Daily PRN Maryellen Pile, MD      . senna-docusate (Senokot-S) tablet 2 tablet  2 tablet Oral Daily PRN Maryellen Pile, MD      . sodium bicarbonate tablet 1,300 mg  1,300 mg Oral TID Madelon Lips, MD   1,300 mg at 01/21/16 0851  . witch hazel-glycerin (TUCKS) pad   Topical PRN Maryellen Pile, MD         Discharge Medications: Please see discharge summary for a list of discharge medications.  Relevant Imaging Results:  Relevant Lab Results:   Additional Information SS#: [id number]  Jorge Ny, LCSW

## 2016-01-21 NOTE — Progress Notes (Signed)
No charge note.  Palliative care consult received. Palliative Medicine Team will see tomorrow 01/22/16. Please feel free to contact team phone if any urgent needs.   Mariana Kaufman, AGNP-C Palliative Medicine  Please call Palliative Medicine team phone with any questions [phone]. For individual providers please see AMION.

## 2016-01-21 NOTE — Progress Notes (Signed)
Nephrology Service Progress Note  S: No acute events overnight.Pt eating breakfast comfortably. She feels less SOB today.   O:BP 117/75 (BP Location: Left Arm)   Pulse (!) 105   Temp 98.1 F (36.7 C) (Oral)   Resp 18   Ht 5' 6"  (1.676 m)   Wt 216 lb 11.4 oz (98.3 kg)   SpO2 96%   BMI 34.98 kg/m   Intake/Output Summary (Last 24 hours) at 01/21/16 0924 Last data filed at 01/21/16 0800  Gross per 24 hour  Intake           1066.8 ml  Output             1976 ml  Net           -909.2 ml   Intake/Output: I/O last 3 completed shifts: In: 1676.5 [P.O.:960; I.V.:584.5; IV Piggyback:132] Out: 2776 [Urine:2775; Stool:1]  Intake/Output this shift:  Total I/O In: 16.7 [I.V.:16.7] Out: -  Weight change: -4 lb 6.6 oz (-2 kg)  General: Vital signs reviewed. Patient in no acute distress Cardiovascular: irregular rate.HR in the 120s. No murmurs Pulmonary/Chest: Clear to auscultation bilaterally, no wheezes or crackles Abdominal: Soft, non-tender, non-distended, BS + Extremities: b/l metatarsal amputations. Bilateral pitting edema to the knees     Recent Labs Lab 01/17/16 2003 01/18/16 0455 01/19/16 0328 01/19/16 1144 01/20/16 0320 01/21/16 0329  NA 139 141 139 139 139 138  K 5.1 5.0 5.5* 5.1 4.5 4.3  CL 113* 117* 113* 115* 112* 110  CO2 14* 11* 12* 14* 13* 17*  GLUCOSE 175* 86 85 133* 134* 124*  BUN 69* 60* 62* 63* 63* 63*  CREATININE 5.36* 5.44* 5.52* 5.60* 5.39* 5.47*  ALBUMIN 3.5  --   --   --   --   --   CALCIUM 8.3* 8.1* 7.9* 7.9* 7.7* 7.7*  AST 36  --   --   --   --   --   ALT 18  --   --   --   --   --    Liver Function Tests:  Recent Labs Lab 01/17/16 2003  AST 36  ALT 18  ALKPHOS 117  BILITOT 1.6*  PROT 7.2  ALBUMIN 3.5    Recent Labs Lab 01/17/16 2003  LIPASE 65*   No results for input(s): AMMONIA in the last 168 hours. CBC:  Recent Labs Lab 01/17/16 2003 01/18/16 0455  WBC 9.2 8.3  NEUTROABS 7.8*  --   HGB 9.2* 8.5*  HCT 30.5* 29.0*   MCV 78.0 79.2  PLT 253 209   Cardiac Enzymes: No results for input(s): CKTOTAL, CKMB, CKMBINDEX, TROPONINI in the last 168 hours. CBG:  Recent Labs Lab 01/20/16 0918 01/20/16 1253 01/20/16 1646 01/20/16 2143 01/21/16 0823  GLUCAP 153* 228* 156* 138* 118*    Iron Studies: No results for input(s): IRON, TIBC, TRANSFERRIN, FERRITIN in the last 72 hours. Studies/Results: No results found. Marland Kitchen atorvastatin  40 mg Oral Daily  . furosemide  160 mg Intravenous BID  . heparin  5,000 Units Subcutaneous Q8H  . hydrocortisone  1 application Rectal BID  . insulin aspart  0-9 Units Subcutaneous TID WC  . levothyroxine  112 mcg Oral QAC breakfast  . mouth rinse  15 mL Mouth Rinse BID  . metoprolol succinate  25 mg Oral Daily  . pantoprazole  40 mg Oral Daily  . sodium bicarbonate  1,300 mg Oral TID    BMET    Component Value Date/Time   NA  138 01/21/2016 0329   NA 141 01/08/2016 1348   K 4.3 01/21/2016 0329   K 5.1 01/08/2016 1348   CL 110 01/21/2016 0329   CL 111 (H) 04/06/2012 1031   CO2 17 (L) 01/21/2016 0329   CO2 14 (L) 01/08/2016 1348   GLUCOSE 124 (H) 01/21/2016 0329   GLUCOSE 115 01/08/2016 1348   GLUCOSE 94 04/06/2012 1031   BUN 63 (H) 01/21/2016 0329   BUN 44.8 (H) 01/08/2016 1348   CREATININE 5.47 (H) 01/21/2016 0329   CREATININE 3.6 (HH) 01/08/2016 1348   CALCIUM 7.7 (L) 01/21/2016 0329   CALCIUM 8.6 01/08/2016 1348   GFRNONAA 7 (L) 01/21/2016 0329   GFRNONAA 21 (L) 10/25/2012 1158   GFRAA 8 (L) 01/21/2016 0329   GFRAA 24 (L) 10/25/2012 1158   CBC    Component Value Date/Time   WBC 8.3 01/18/2016 0455   RBC 3.66 (L) 01/18/2016 0455   HGB 8.5 (L) 01/18/2016 0455   HGB 8.8 (L) 01/08/2016 1348   HCT 29.0 (L) 01/18/2016 0455   HCT 29.1 (L) 01/08/2016 1348   PLT 209 01/18/2016 0455   PLT 184 01/08/2016 1348   PLT 166 01/31/2015 1117   MCV 79.2 01/18/2016 0455   MCV 78.0 (L) 01/08/2016 1348   MCH 23.2 (L) 01/18/2016 0455   MCHC 29.3 (L) 01/18/2016 0455    RDW 20.3 (H) 01/18/2016 0455   RDW 21.4 (H) 01/08/2016 1348   LYMPHSABS 0.5 (L) 01/17/2016 2003   LYMPHSABS 1.0 01/08/2016 1348   MONOABS 0.9 01/17/2016 2003   MONOABS 0.8 01/08/2016 1348   EOSABS 0.0 01/17/2016 2003   EOSABS 0.1 01/08/2016 1348   EOSABS 0.5 (H) 01/31/2015 1117   BASOSABS 0.0 01/17/2016 2003   BASOSABS 0.0 01/08/2016 1348     Assessment/Plan:   1. AKI on CKD3-4with baseline Cr in the 3s. Most likely due to hypoperfusion in the setting of refractory afib with RVR.  K/L light chains and SPEP were ordered and are pending, to ensure that the MGUS has not progressed. She is being diuresed with lasix 160 mg IV BID and metolazone 5 mg BID and had output of about 2200 mL yesterday. Creatinine has not improved and has stayed at 5.47, and her weight is down about 5  Lbs. We emphasized again today that pt is not Mosley good HD candidate and patient seems to understand. Primary team is moving forward with palliative care consult.  1. Given her poor functional status as well as her ongoing refractory Mosley fib with RVR and hypotension, she is not Mosley suitable candidate for hemodialysis as she would not benefit from this and may hasten her demise.  Agree with proceeding with palliative care to help set goals/limits of care.    2. Metabolic acidosis- improving, and currently on sodium bicarb 1300 PO TID 3. Afib with RVR: HR still in the 100-120s. And currently on IV amiodarone. HF team following. Added toprol 25 mg daily . Eliquis held  4. Acute on chronic systolic HF with reduced EF: currently being diuresed with IV lasix and metolazone, HF team following. Volume status has improved today and net output was about 2 L over past 24 hours.  5. MGUS: SPEP and serum light chains ordered and pending . She declined outpatient bone marrow biopsy in the past . 6. T2Dm and rectal pain- per primary team  7. History of CVA- holding eliquis due to AKI 8. Goals of care- recommend palliative care  consult  Signed, [name] PGY  2- Internal Medicine Nephrology Service  I have seen and examined this patient and agree with plan and assessment in the above note with renal recommendations/intervention highlighted.  Will continue with conservative therapy for now. [name] Stacy Heyward,MD 01/21/2016 12:12 PM

## 2016-01-21 NOTE — Progress Notes (Signed)
Subjective: Continues to feel better, with a little more energy.  Rectal pain improving.  No chest pain or shortness of breath, though she has noticed an occasional nonproductive cough.  Objective:  Vital signs in last 24 hours: Vitals:   01/20/16 2002 01/21/16 0006 01/21/16 0333 01/21/16 0353  BP: 133/76 134/80 104/75   Pulse: (!) 106 (!) 110 (!) 111   Resp: 13 17 17    Temp: 98 F (36.7 C) 98.1 F (36.7 C) 97.6 F (36.4 C)   TempSrc: Oral Oral Oral   SpO2: 98% 96% 93%   Weight:    216 lb 11.4 oz (98.3 kg)  Height:       Physical Exam  Constitutional: She is oriented to person, place, and time. No distress.  Cardiovascular:  Tachycardic, irregularly irregular rhythm, normal S1 and S2, 2/6 systolic murmur  Pulmonary/Chest:  Lungs clear to auscultation bilaterally.  No crackles.  Musculoskeletal:  BiLE with trace edema Neurological: She is alert and oriented to person, place, and time.  Skin: Skin is warm and dry.  Psychiatric: She has a normal mood and affect. Her behavior is normal.   Telemetry: atrial fibrillation, rates 100s-120 overnight, average ~110  BMP Latest Ref Rng & Units 01/21/2016 01/20/2016 01/19/2016  Glucose 65 - 99 mg/dL 124(H) 134(H) 133(H)  BUN 6 - 20 mg/dL 63(H) 63(H) 63(H)  Creatinine 0.44 - 1.00 mg/dL 5.47(H) 5.39(H) 5.60(H)  BUN/Creat Ratio 11 - 26 - - -  Sodium 135 - 145 mmol/L 138 139 139  Potassium 3.5 - 5.1 mmol/L 4.3 4.5 5.1  Chloride 101 - 111 mmol/L 110 112(H) 115(H)  CO2 22 - 32 mmol/L 17(L) 13(L) 14(L)  Calcium 8.9 - 10.3 mg/dL 7.7(L) 7.7(L) 7.9(L)    Intake/Output Summary (Last 24 hours) at 01/21/16 0819 Last data filed at 01/21/16 0600  Gross per 24 hour  Intake           1116.1 ml  Output             1976 ml  Net           -859.9 ml   Echo 01/19/16: EF30-35% (35-40% in 09/2014)  Assessment/Plan:  Principal Problem:   Acute on chronic renal failure (HCC) Active Problems:   Hypothyroidism   Diabetes mellitus with neuropathy  (HCC)   Atrial fibrillation (HCC)   Cardiorenal syndrome   Chronic atrial fibrillation (HCC)   Acute systolic congestive heart failure (HCC)   #Acute on Chronic Renal Failure  #Non-Gap Metabolic Acidosis Multifactorial with worsening CKD likely due to DM and MM/MGUS, with acute hypoperfusion due to AF RVR.  No nephrotic range proteinuria. Her acidosis is likely hyperchloremic metabolic acidosis from her renal failure.  Her Creatinine is not falling even with her improving hemodynamics.  May have established a new baseline with further irreversible injury.  Likely not be a candidate for maintenance HD due to her comorbidities. -Daily BMP -PO bicarb 1300 TID -Renal following  #Atrial Fibrillation with RVR Likely exacerbating heart failure.  Metoprolol prescribed at home for rate control, but reports noncompliance.  Given her hypotension and reported home medication noncompliance, beta blocker was not started at admission.  Rates in 110s overnight with BPs 130s/70s.  Was on Apixaban prior to admission, but will need to address anticoagulation plan in light of her renal function. -Continue IV amiodarone infusion at 30 mg/hr per cardiology -Add 25 mg metoprolol succinate daily per cardiology  #Volume Overload Volume status and hemodynamics have improved, with 2L UOP yesterday  with 160 mg IV furosemide BID and 5 mg metolazone once.  Continue diuresis.  -Continue 160 mg IV furosemide BID -Repeat 5 mg PO metolazone this afternoon -Follow I/Os and weights  #Hyperkalemia Resloved. -Daily BMP  #Goals of Care Discussed her medical conditions with family yesterday, including that she previously had poor renal function and it is now worse and may not recover.  Described that we have seen some improvement with her cardiovascular and volume status during this hospitalization, but that at the long term approach to her care some be discussed.  Offered Palliative Care consult, and explained their role in  patient care.  Daughter [name] is designated Scientist, research (medical) should she lose capacity. -Available to talk with family -Palliative consult placed -PT/OT  #DM2 -SSI  #Hypothyroidism Slightly elevated TSH -Continue home levothyroxine for now  #Rectal Pain Symptomatic treatment for external hemorrhoids. -Hydrocortisone cream, lidocaine cream, Tucks  Dispo: Anticipated discharge in approximately 3 day(s).   Minus Liberty, MD 01/21/2016, 8:19 AM Pager: [phone]

## 2016-01-22 DIAGNOSIS — Z7189 Other specified counseling: Secondary | ICD-10-CM

## 2016-01-22 DIAGNOSIS — Z66 Do not resuscitate: Secondary | ICD-10-CM

## 2016-01-22 DIAGNOSIS — Z515 Encounter for palliative care: Secondary | ICD-10-CM

## 2016-01-22 LAB — BASIC METABOLIC PANEL
ANION GAP: 12 (ref 5–15)
BUN: 63 mg/dL — ABNORMAL HIGH (ref 6–20)
CALCIUM: 7.8 mg/dL — AB (ref 8.9–10.3)
CO2: 20 mmol/L — AB (ref 22–32)
Chloride: 107 mmol/L (ref 101–111)
Creatinine, Ser: 5.34 mg/dL — ABNORMAL HIGH (ref 0.44–1.00)
GFR, EST AFRICAN AMERICAN: 8 mL/min — AB (ref >60)
GFR, EST NON AFRICAN AMERICAN: 7 mL/min — AB (ref >60)
GLUCOSE: 155 mg/dL — AB (ref 65–99)
POTASSIUM: 4.2 mmol/L (ref 3.5–5.1)
Sodium: 139 mmol/L (ref 135–145)

## 2016-01-22 LAB — PROTEIN ELECTROPHORESIS, SERUM
A/G RATIO SPE: 1 (ref 0.7–1.7)
ALPHA-2-GLOBULIN: 0.6 g/dL (ref 0.4–1.0)
Albumin ELP: 3.2 g/dL (ref 2.9–4.4)
Alpha-1-Globulin: 0.3 g/dL (ref 0.0–0.4)
Beta Globulin: 1 g/dL (ref 0.7–1.3)
GLOBULIN, TOTAL: 3.1 g/dL (ref 2.2–3.9)
Gamma Globulin: 1.2 g/dL (ref 0.4–1.8)
TOTAL PROTEIN ELP: 6.3 g/dL (ref 6.0–8.5)

## 2016-01-22 LAB — KAPPA/LAMBDA LIGHT CHAINS
Kappa free light chain: 580.4 mg/L — ABNORMAL HIGH (ref 3.3–19.4)
Kappa, lambda light chain ratio: 9.17 — ABNORMAL HIGH (ref 0.26–1.65)
LAMDA FREE LIGHT CHAINS: 63.3 mg/L — AB (ref 5.7–26.3)

## 2016-01-22 LAB — GLUCOSE, CAPILLARY: GLUCOSE-CAPILLARY: 121 mg/dL — AB (ref 65–99)

## 2016-01-22 LAB — CBC
HEMATOCRIT: 27.4 % — AB (ref 36.0–46.0)
HEMOGLOBIN: 8.2 g/dL — AB (ref 12.0–15.0)
MCH: 22.9 pg — ABNORMAL LOW (ref 26.0–34.0)
MCHC: 29.9 g/dL — ABNORMAL LOW (ref 30.0–36.0)
MCV: 76.5 fL — ABNORMAL LOW (ref 78.0–100.0)
Platelets: 139 10*3/uL — ABNORMAL LOW (ref 150–400)
RBC: 3.58 MIL/uL — AB (ref 3.87–5.11)
RDW: 19.6 % — ABNORMAL HIGH (ref 11.5–15.5)
WBC: 7.1 10*3/uL (ref 4.0–10.5)

## 2016-01-22 MED ORDER — WARFARIN SODIUM 5 MG PO TABS: 5.0000 mg | ORAL_TABLET | Freq: Every day | ORAL | Status: DC

## 2016-01-22 MED ORDER — POLYETHYLENE GLYCOL 3350 17 G PO PACK
17.0000 g | PACK | Freq: Every day | ORAL | Status: AC
  Administered 2016-01-22 – 2016-01-24 (×3): 17 g via ORAL
  Filled 2016-01-22 (×3): qty 1

## 2016-01-22 MED ORDER — AMIODARONE HCL 200 MG PO TABS
200.0000 mg | ORAL_TABLET | Freq: Two times a day (BID) | ORAL | Status: DC
  Administered 2016-01-22 – 2016-01-25 (×7): 200 mg via ORAL
  Filled 2016-01-22 (×7): qty 1

## 2016-01-22 MED ORDER — SENNA 8.6 MG PO TABS
1.0000 | ORAL_TABLET | Freq: Every day | ORAL | Status: AC
  Administered 2016-01-22 – 2016-01-24 (×3): 8.6 mg via ORAL
  Filled 2016-01-22 (×3): qty 1

## 2016-01-22 MED ORDER — WARFARIN - PHYSICIAN DOSING INPATIENT: Freq: Every day | Status: DC

## 2016-01-22 MED ORDER — SODIUM CHLORIDE 0.9 % IV SOLN
510.0000 mg | Freq: Once | INTRAVENOUS | Status: DC
  Filled 2016-01-22: qty 17

## 2016-01-22 MED ORDER — METOLAZONE 10 MG PO TABS
5.0000 mg | ORAL_TABLET | Freq: Every day | ORAL | Status: DC
  Administered 2016-01-22: 5 mg via ORAL
  Filled 2016-01-22: qty 1

## 2016-01-22 NOTE — Progress Notes (Signed)
Advanced Heart Failure Rounding Note   Subjective:    Remains on high-dose IV lasix + metolazone.  Weight down another 4 pounds.   Complaining of mild dyspnea in bed.   Creatinine stable. 5.39-> 5.47-> 5.3  Echo reviewed EF 35%. Moderate AS   Objective:   Weight Range:  Vital Signs:   Temp:  [97 F (36.1 C)-98.1 F (36.7 C)] 98.1 F (36.7 C) (09/05 0724) Pulse Rate:  [95-122] 98 (09/05 0535) Resp:  [0-18] 10 (09/05 0724) BP: (97-130)/(57-85) 100/60 (09/05 0535) SpO2:  [93 %-100 %] 93 % (09/05 0535) Weight:  [212 lb 15.4 oz (96.6 kg)] 212 lb 15.4 oz (96.6 kg) (09/05 0500) Last BM Date: 01/18/16  Weight change: Filed Weights   01/20/16 0500 01/21/16 0353 01/22/16 0500  Weight: 221 lb 1.9 oz (100.3 kg) 216 lb 11.4 oz (98.3 kg) 212 lb 15.4 oz (96.6 kg)    Intake/Output:   Intake/Output Summary (Last 24 hours) at 01/22/16 0800 Last data filed at 01/22/16 0500  Gross per 24 hour  Intake           1136.7 ml  Output             2050 ml  Net           -913.3 ml     Physical Exam: General:  Chronically ill and elderly appearing. In bed.  HEENT: normal  Neck: supple. JVP 8-9 Cor: PMI nondisplaced. Irregular rate and rhythm, tachy. 2/6 SEM at RUSB  Lungs: Diminished  Abdomen: Obese, NT, mild distended but soft, no HSM. No bruits or masses. +BS  Extremities: no cyanosis, clubbing, rash. R and LLE 1+ edema up into thighs.  Neuro: alert & orientedx3, cranial nerves grossly intact. moves all 4 extremities w/o difficulty. Affect pleasant  Telemetry:  AF 90s  Labs: Basic Metabolic Panel:  Recent Labs Lab 01/19/16 0328 01/19/16 1144 01/20/16 0320 01/21/16 0329 01/22/16 0244  NA 139 139 139 138 139  K 5.5* 5.1 4.5 4.3 4.2  CL 113* 115* 112* 110 107  CO2 12* 14* 13* 17* 20*  GLUCOSE 85 133* 134* 124* 155*  BUN 62* 63* 63* 63* 63*  CREATININE 5.52* 5.60* 5.39* 5.47* 5.34*  CALCIUM 7.9* 7.9* 7.7* 7.7* 7.8*    Liver Function Tests:  Recent Labs Lab  01/17/16 2003  AST 36  ALT 18  ALKPHOS 117  BILITOT 1.6*  PROT 7.2  ALBUMIN 3.5    Recent Labs Lab 01/17/16 2003  LIPASE 65*   No results for input(s): AMMONIA in the last 168 hours.  CBC:  Recent Labs Lab 01/17/16 2003 01/18/16 0455 01/22/16 0244  WBC 9.2 8.3 7.1  NEUTROABS 7.8*  --   --   HGB 9.2* 8.5* 8.2*  HCT 30.5* 29.0* 27.4*  MCV 78.0 79.2 76.5*  PLT 253 209 139*    Cardiac Enzymes: No results for input(s): CKTOTAL, CKMB, CKMBINDEX, TROPONINI in the last 168 hours.  BNP: BNP (last 3 results)  Recent Labs  01/17/16 2003  BNP 238.3*    ProBNP (last 3 results) No results for input(s): PROBNP in the last 8760 hours.    Other results:  Imaging: No results found.   Medications:     Scheduled Medications: . aspirin EC  81 mg Oral Daily  . atorvastatin  40 mg Oral Daily  . furosemide  160 mg Intravenous BID  . heparin  5,000 Units Subcutaneous Q8H  . hydrocortisone  1 application Rectal BID  . insulin  aspart  0-9 Units Subcutaneous TID WC  . levothyroxine  112 mcg Oral QAC breakfast  . mouth rinse  15 mL Mouth Rinse BID  . metolazone  5 mg Oral Daily  . metoprolol succinate  25 mg Oral Daily  . pantoprazole  40 mg Oral Daily  . sodium bicarbonate  1,300 mg Oral TID    Infusions: . amiodarone 30 mg/hr (01/21/16 2201)    PRN Medications: acetaminophen **OR** acetaminophen, lidocaine, polyethylene glycol, senna-docusate, witch hazel-glycerin   Assessment:   1. Acute on chronic systolic CHF (LVEF 123456 09/2014) 2. 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 Chronic Afib - Acute RVR This patients CHA2DS2-VASc Score and unadjusted Ischemic Stroke Rate (% per year) is equal to 9.7 % stroke rate/year from a score of 6  Above score calculated as 1 point each if present [CHF, HTN, DM, Vascular=MI/PAD/Aortic Plaque, Age if 65-74, or Female] Above score calculated as 2 points each if present [Age > 75, or Stroke/TIA/TE]   4. Hx of CVA 5. HTN 6. HLD 7.  MGUS vs MM 8. Chronic anemia with scheduled transfusions/EPO.   9. T2DM 10. Hypotension  Plan/Discussion:     Volume status improved. Creatinine unchanged.  Renal adjusting diuretics. Per renal--> not a candidate for dialysis.   AF rate is improved on IV amio + Toprol XL 25 mg daily.  SBP soft would not increase.   Echo reviewed by Dr Haroldine Laws. EF relatively stable at 35%. AS is moderate. No ACE/ARB/ARNI with renal failure.   Palliative Care meeting today for goals of care.   Amy Clegg, NP-C  8:00 AM  Advanced Heart Failure Team Pager [phone] (M-F; 7a - 4p)  Please contact [facility] for night-coverage after hours (4p -7a ) and weekends on [email or web address]  Patient seen and examined with Darrick Grinder, NP. We discussed all aspects of the encounter. I agree with the assessment and plan as stated above.   Volume status looks better. Renal managing diuretics.   AF rate improved. We will switch to po amio. Consider using Eliquis 2.5 bid or switching to Coumadin.   Not much else to offer. Agree with Palliative Care consult.   We will follow at a distance. Please call with questions.  Tag Wurtz,MD 8:36 AM

## 2016-01-22 NOTE — Consult Note (Signed)
Consultation Note Date: 01/22/2016   Patient Name: [name]  DOB: 09-Oct-1934  MRN: [id number]  Age / Sex: 81 y.o., female  PCP: [name], MD Referring Physician: Axel Filler, MD  Reason for Consultation: Establishing goals of care  HPI/Patient Profile: 81 y.o. female  with past medical history of heart failure with EF 35-40%, atrial fib, stroke, type 2 DM, hypertension, hyperlipidemia, hypothyroidism, osteomyelitis s/p bilateral toe amputations, monoclonal gammopathy of unknown significance vs. Multiple myeloma, chronic kidney disease, and anemia admitted on 01/17/2016 with progressive leg swelling, fatigue, rectal pain, and upper respiratory infection symptoms. Patient is in afib RVR exacerbating heart failure. She is on an amiodarone drip and receiving lasix. Cardiology following. Acute on chronic renal disease and per nephrology, patient is not a suitable candidate for hemodialysis. Palliative Medicine consultation for goals of care, anticipatory guidance, and symptom management.   Clinical Assessment and Goals of Care: Met with [name] this morning. She is alert, oriented, and comfortable sitting up in bed. She states she has been living at home and has a caretaker that comes to the house daily. She also has a supportive family-a daughter, son, and niece who live in the area. Prior to admission, she had been ambulating with a walker but has been more tired the past two weeks. She states she knew it was time to go to the hospital. [name] is aware that her heart is "not good" and her kidneys are getting worse and that she will most likely not be able to tolerate dialysis due to her heart. She is a spiritual person and believes anything could happen but has accepted she is sick. She is not afraid but worries about her children understanding and accepting how sick she is. Her daughter has offered  for her to move into her basement but [name] declined because she did not want to be a burden and has wanted to live as independently. She speaks of her sister who was bed bound in a nursing facility for three years and did not know who she ([name]) was. [name] would not want aggressive measures to the point where she would be in a facility unaware of her family or surroundings. She would like to stay independent as long as possible.   Shortly after, [name] and I met with patient, daughter [name]) and niece [name]). [name] and [name] have a good understanding of [name] heart failure and worsening kidney function. They are aware that she has been receiving aggressive treatment thus far in hopes of reversing her kidney failure but have also been told she is not a candidate for dialysis and her heart could not tolerate this. They are hopefully that she will rebound from this and want to see how she does in the next few days. [name] has been talking with [name], [name], and agrees that [name] needs rehab. Discussed code status with patient and family. She does not wish to be intubated on life support with multiple tubes. Ms.  Mosley states "I am not afraid to die." Patient and family understand there would be no benefit of resuscitation if her heart would stop and she would naturally die, and even if resuscitated, her heart and kidneys would still be failing. [name] understand [name] wishes and will continue to help her make medical decisions.   NEXT OF KIN-Patient states her daughter, two sons, and neice know her wishes and would want them to make decisions for her if she was unable to do so.     SUMMARY OF RECOMMENDATIONS    DNR/DNI per patient and family.  Continue to shadow chart on patient progression, especially kidney function.   Continue to follow up with patient and family regarding goals of care as hospitalization progresses.  Symptom management-see  below  Disposition SNF with palliative services vs. Residential hospice if kidney function continues to decline.   Code Status/Advance Care Planning:  DNR   Symptom Management:   Rectal pain-continue witch hazel-glycerin pad prn itching. Patient states relief from this.   LBM 9/1 per chart-Scheduled Senna 1 tablet PO daily x3 days and Miralax 17g daily x3 days.   Denies dyspnea, nausea, or other areas of pain.  Palliative Prophylaxis:   Aspiration, Bowel Regimen, Delirium Protocol and Frequent Pain Assessment  Additional Recommendations (Limitations, Scope, Preferences):  Full Scope Treatment-except DNR   Psycho-social/Spiritual:   Desire for further Chaplaincy support:no  Additional Recommendations: Caregiving  Support/Resources and Stacy on Hospice  Prognosis:   < 6 months-Could be significantly less if kidney function continues to worsen. Patient is not a dialysis candidate.   Discharge Planning: To Be Determined     Primary Diagnoses: Present on Admission: . Acute on chronic renal failure ([name]) . Hypothyroidism . Diabetes mellitus with neuropathy ([name]) . Atrial fibrillation ([name])   I have reviewed the medical record, interviewed the patient and family, and examined the patient. The following aspects are pertinent.  Past Medical History:  Diagnosis Date  . Anemia    b12 def, iron def, follow at cancer center, gets B12 and  another injection there. Could not remeber the name. Dr. Ralene Ok is her  cancer doctor  . Asthma   . Atrial fibrillation ([place])   . Blood transfusion    two or more yrs ago  . Chronic renal insufficiency   . Chronic venous insufficiency   . Diabetes mellitus   . Hyperlipidemia   . Hypertension   . Hypothyroidism    thyroid removed 4 or more yrs ago  . MGUS (monoclonal gammopathy of unknown significance)   . Osteomyelitis ([place])    s/p Rt 2nd toe and left 5 toes  amputation in 1/12  by Dr. Sharol Given  . Stroke ([place])   . Thyroid  disease    hypothyroidism h/o hyperthyroidism s/p ablation/ectomy   Social History   Social History  . Marital status: Widowed    Spouse name: N/A  . Number of children: N/A  . Years of Stacy: N/A   Social History Main Topics  . Smoking status: Never Smoker  . Smokeless tobacco: Never Used  . Alcohol use No  . Drug use: No  . Sexual activity: Not Asked   Other Topics Concern  . None   Social History Narrative  . None   Family History  Problem Relation Age of Onset  . Anemia Mother   . Colon cancer Mother     rectal  . HIV Brother   . Cancer Brother    Scheduled Meds: .  aspirin EC  81 mg Oral Daily  . atorvastatin  40 mg Oral Daily  . furosemide  160 mg Intravenous BID  . heparin  5,000 Units Subcutaneous Q8H  . hydrocortisone  1 application Rectal BID  . insulin aspart  0-9 Units Subcutaneous TID WC  . levothyroxine  112 mcg Oral QAC breakfast  . mouth rinse  15 mL Mouth Rinse BID  . metolazone  5 mg Oral Daily  . metoprolol succinate  25 mg Oral Daily  . pantoprazole  40 mg Oral Daily  . sodium bicarbonate  1,300 mg Oral TID   Continuous Infusions: . amiodarone 30 mg/hr (01/21/16 2201)   PRN Meds:.acetaminophen **OR** acetaminophen, lidocaine, polyethylene glycol, senna-docusate, witch hazel-glycerin Medications Prior to Admission:  Prior to Admission medications   Medication Sig Start Date End Date Taking? Authorizing Provider  atorvastatin (LIPITOR) 40 MG tablet TAKE 1 TABLET BY MOUTH EVERY DAY 08/01/15  Yes Bartholomew Crews, MD  calcitRIOL (ROCALTROL) 0.25 MCG capsule Take 1 capsule by mouth every Monday, Wednesday, and Friday. 04/10/14  Yes Historical Provider, MD  cetirizine (ZYRTEC) 10 MG chewable tablet Chew 1 tablet (10 mg total) by mouth daily. Patient taking differently: Chew 10 mg by mouth daily as needed for allergies.  09/19/15  Yes Carly J Rivet, MD  ELIQUIS 5 MG TABS tablet TAKE 1 TABLET(5 MG) BY MOUTH TWICE DAILY 06/15/15  Yes [name], MD  furosemide (LASIX) 20 MG tablet TAKE 1 TABLET(20 MG) BY MOUTH DAILY Patient taking differently: TAKE 1 TABLET(20 MG) BY MOUTH TWICE DAILY. 08/24/15  Yes [name], MD  glipiZIDE (GLUCOTROL) 10 MG tablet Take 0.5 tablets (5 mg total) by mouth 2 (two) times daily with a meal. 08/29/15  Yes [name], MD  JANUVIA 25 MG tablet TAKE 1 TABLET BY MOUTH EVERY DAY 09/05/15  Yes [name], MD  levothyroxine (SYNTHROID, LEVOTHROID) 112 MCG tablet TAKE 1 TABLET(112 MCG) BY MOUTH DAILY BEFORE BREAKFAST 10/24/15  Yes [name], MD  lisinopril (PRINIVIL,ZESTRIL) 40 MG tablet TAKE 1 TABLET BY MOUTH EVERY DAY 12/31/15  Yes [name], MD  metoprolol tartrate (LOPRESSOR) 25 MG tablet Take 1 tablet (25 mg total) by mouth 2 (two) times daily. 09/03/15  Yes Oval Linsey, MD  pantoprazole (PROTONIX) 40 MG tablet TAKE 1 TABLET BY MOUTH EVERY DAY 07/07/15  Yes [name], MD  polyethylene glycol (MIRALAX / GLYCOLAX) packet Take 17 g by mouth daily as needed for mild constipation or moderate constipation.    Yes Historical Provider, MD  sennosides-docusate sodium (SENOKOT-S) 8.6-50 MG tablet Take 2 tablets by mouth daily as needed for constipation.    Yes Historical Provider, MD  UNABLE TO FIND Inject 1 each into the skin every 28 (twenty-eight) days. Vitamin D injection monthly at the cancer center.   Yes Historical Provider, MD  Blood Glucose Monitoring Suppl (ONETOUCH VERIO FLEX SYSTEM) W/DEVICE KIT 1 each by Does not apply route every morning. Check blood sugar one time each day 05/07/15   [name], MD  Cholecalciferol (VITAMIN D3) 10000 UNITS capsule Take 1 capsule (10,000 Units total) by mouth once a week. Patient not taking: Reported on 01/17/2016 08/04/14   [name], MD  clotrimazole (LOTRIMIN) 1 % cream Apply 1 application topically 2 (two) times daily. Patient not taking: Reported on 01/17/2016 09/19/15   Sindy Guadeloupe Rivet, MD  glucose blood (ONETOUCH VERIO) test strip Check  blood sugar one time each day 05/07/15  [name], MD  [facility] DELICA LANCETS FINE MISC Check blood sugar one time each day 05/07/15   [name], MD   No Known Allergies Review of Systems  Constitutional: Positive for activity change, appetite change and fatigue.  Respiratory: Negative.   Cardiovascular: Positive for leg swelling. Negative for chest pain.  Gastrointestinal: Positive for rectal pain. Negative for nausea and vomiting.  Genitourinary: Negative.   Neurological: Negative.   Psychiatric/Behavioral: Negative.     Physical Exam  Constitutional: She is oriented to person, place, and time. She appears well-developed.  Cardiovascular: Normal heart sounds.  An irregularly irregular rhythm present.  Pulmonary/Chest: No accessory muscle usage. No tachypnea. No respiratory distress. She has decreased breath sounds.  Abdominal: Soft. Bowel sounds are normal. There is no tenderness.  Neurological: She is alert and oriented to person, place, and time.  Skin: Skin is warm and dry.  Psychiatric: She has a normal mood and affect. Her speech is normal and behavior is normal. Thought content normal. Cognition and memory are normal.  Nursing note and vitals reviewed.   Vital Signs: BP 100/60   Pulse 98   Temp 98.1 F (36.7 C) (Oral)   Resp 10   Ht _0  (1.676 m)   Wt 96.6 kg (212 lb 15.4 oz)   SpO2 93%   BMI 34.37 kg/m  Pain Assessment: No/denies pain   Pain Score: 0-No pain   SpO2: SpO2: 93 % O2 Device:SpO2: 93 % O2 Flow Rate: .O2 Flow Rate (L/min): 2 L/min  IO: Intake/output summary:  Intake/Output Summary (Last 24 hours) at 01/22/16 0903 Last data filed at 01/22/16 0500  Gross per 24 hour  Intake             1120 ml  Output             2050 ml  Net             -930 ml    LBM: Last BM Date: 01/18/16 Baseline Weight: Weight: 94.9 kg (209 lb 3.2 oz) Most recent weight: Weight: 96.6 kg (212 lb 15.4 oz)     Palliative Assessment/Data: PPS 40%       Time In: 0900 Time Out: 1010 Time Total: 76mn Greater than 50%  of this time was spent counseling and coordinating care related to the above assessment and plan.  Signed by:  MIhor Dow FNP-C Palliative Medicine Team  Phone: [phone]: [phone]  Please contact Palliative Medicine Team phone at [phone] questions and concerns.  For individual provider: See AAshley JacobsMD [facility] palliative medicine team [phone]

## 2016-01-22 NOTE — Progress Notes (Signed)
OT Cancellation Note  Patient Details Name: [name] MRN: [id number] DOB: 1934/07/21   Cancelled Treatment:    Reason Eval/Treat Not Completed: Fatigue/lethargy limiting ability to participate.  Pt reports she is fatigued and does not wish to work with OT today.  In light of palliative meeting, will continue to check with her to determine needs for discharge, and her desire to work with OT.   [place], OTR/L [id number]   [name] 01/22/2016, 12:38 PM

## 2016-01-22 NOTE — Progress Notes (Signed)
Subjective: No acute events overnight.  Feels about the same, with no chest pain, dyspnea, nausea, or vomiting.  Was unable to work with OT yesterday due to fatigue.  Aware that palliative care is planning to talk to her today.  Objective:  Vital signs in last 24 hours: Vitals:   01/22/16 0328 01/22/16 0400 01/22/16 0500 01/22/16 0535  BP: (!) 104/57 123/82  100/60  Pulse: 95 (!) 102  98  Resp: (!) 0 18  (!) 0  Temp: 97 F (36.1 C)     TempSrc: Axillary     SpO2: 97% 93%  93%  Weight:   212 lb 15.4 oz (96.6 kg)   Height:       Physical Exam  Constitutional: She is oriented to person, place, and time. No distress.  Cardiovascular:  Irregularly irregular rhythm, normal S1 and S2, 2/6 systolic murmur  Pulmonary/Chest:  Lungs clear to auscultation bilaterally.  No crackles.  Musculoskeletal:  Mild biLE edema to thighs Neurological: She is alert and oriented to person, place, and time.  Skin: Skin is warm and dry.  Psychiatric: She has a normal mood and affect. Her behavior is normal.   Telemetry: atrial fibrillation, rates ~100 overnight  BMP Latest Ref Rng & Units 01/22/2016 01/21/2016 01/20/2016  Glucose 65 - 99 mg/dL 155(H) 124(H) 134(H)  BUN 6 - 20 mg/dL 63(H) 63(H) 63(H)  Creatinine 0.44 - 1.00 mg/dL 5.34(H) 5.47(H) 5.39(H)  BUN/Creat Ratio 11 - 26 - - -  Sodium 135 - 145 mmol/L 139 138 139  Potassium 3.5 - 5.1 mmol/L 4.2 4.3 4.5  Chloride 101 - 111 mmol/L 107 110 112(H)  CO2 22 - 32 mmol/L 20(L) 17(L) 13(L)  Calcium 8.9 - 10.3 mg/dL 7.8(L) 7.7(L) 7.7(L)    Intake/Output Summary (Last 24 hours) at 01/22/16 0654 Last data filed at 01/22/16 0500  Gross per 24 hour  Intake           1153.4 ml  Output             2050 ml  Net           -896.6 ml   Filed Weights   01/20/16 0500 01/21/16 0353 01/22/16 0500  Weight: 221 lb 1.9 oz (100.3 kg) 216 lb 11.4 oz (98.3 kg) 212 lb 15.4 oz (96.6 kg)   Echo 01/19/16: EF30-35% (35-40% in 09/2014)  Assessment/Plan:  Principal  Problem:   Acute on chronic renal failure (HCC) Active Problems:   Hypothyroidism   Diabetes mellitus with neuropathy (HCC)   Atrial fibrillation (HCC)   Cardiorenal syndrome   Chronic atrial fibrillation (HCC)   Acute systolic congestive heart failure (HCC)   #Acute on Chronic Renal Failure  #Non-Gap Metabolic Acidosis Multifactorial with worsening CKD likely due to DM and MM/MGUS, with acute hypoperfusion due to AF RVR.  No nephrotic range proteinuria. Her acidosis is likely hyperchloremic metabolic acidosis from her renal failure.  Her renal function has not improved in parallel with her hemodynamics.  May have established a new baseline with further irreversible injury, but renal perfusion may still improve with better rate control.  Not a candidate for maintenance HD due to her comorbidities. -Daily BMP -PO bicarb 1300 TID -Renal following  #Atrial Fibrillation with RVR Rates now down to 100s with addition of metoprolol to her amiodarone infusion, without significant hypotension.  Was on Apixaban prior to admission, but will need to address anticoagulation plan in light of her renal function.  Apixaban 2.5 mg BID may be an  option. -Continue IV amiodarone infusion at 30 mg/hr per cardiology -Continue 25 mg metoprolol succinate daily per cardiology -Aspirin 81 mg daily  #CHF #Volume Overload Volume status continues to improve, net -4L and -9 lbs since admission.  BUN and Cr stable.  Baseline weight ~180 lbs on prior clinic visits.  Continue diuresis with 160 mg IV furosemide BID and 5 mg metolazone at night.  -Continue 160 mg IV furosemide BID -Repeat 5 mg PO metolazone this afternoon -Follow I/Os and weights  #Hyperkalemia Resloved. -Daily BMP  #Goals of Care Discussed palliative care involvement with patient and niece yesterday.  Described that we have seen some improvement with her cardiovascular and volume status during this hospitalization, but that her renal disease is  approaching end stage without option of dialysis. Daughter [name] is designated Scientist, research (medical). -Palliative consult placed  #DM2 -SSI  #Hypothyroidism Slightly elevated TSH -Continue home levothyroxine for now  #Rectal Pain Symptomatic treatment for external hemorrhoids. -Hydrocortisone cream, lidocaine cream, Tucks  Dispo: Anticipated discharge in approximately 3 day(s).   Minus Liberty, MD 01/22/2016, 6:54 AM Pager: [phone]

## 2016-01-22 NOTE — Progress Notes (Signed)
Nephrology Service Progress Note  S: No acute events overnight.Pt eating breakfast comfortably.  I was present during the palliative care consult. Pt seemed to have good insight into her condition and knows that she is not a dialysis candidate. For now, she would want continuing medical management. Family meeting is being set up. Weight is down to 212 lbs   O:BP 107/67   Pulse (!) 112   Temp 98.1 F (36.7 C) (Oral)   Resp 10   Ht 5' 6"  (1.676 m)   Wt 212 lb 15.4 oz (96.6 kg)   SpO2 93%   BMI 34.37 kg/m   Intake/Output Summary (Last 24 hours) at 01/22/16 1104 Last data filed at 01/22/16 0900  Gross per 24 hour  Intake           1136.7 ml  Output             2050 ml  Net           -913.3 ml   Intake/Output: I/O last 3 completed shifts: In: 1353.8 [P.O.:720; I.V.:567.8; IV Piggyback:66] Out: 4034 [Urine:4025; Stool:1]  Intake/Output this shift:  Total I/O In: 33.4 [I.V.:33.4] Out: -  Weight change: -3 lb 12 oz (-1.7 kg)  General: Vital signs reviewed. Patient in no acute distress Cardiovascular: irregular rate.HR in the 120s. No murmurs Pulmonary/Chest: Clear to auscultation bilaterally, no wheezes or crackles Abdominal: Soft, non-tender, non-distended, BS + Extremities: b/l metatarsal amputations. Bilateral pitting edema to the knees     Recent Labs Lab 01/17/16 2003 01/18/16 0455 01/19/16 0328 01/19/16 1144 01/20/16 0320 01/21/16 0329 01/22/16 0244  NA 139 141 139 139 139 138 139  K 5.1 5.0 5.5* 5.1 4.5 4.3 4.2  CL 113* 117* 113* 115* 112* 110 107  CO2 14* 11* 12* 14* 13* 17* 20*  GLUCOSE 175* 86 85 133* 134* 124* 155*  BUN 69* 60* 62* 63* 63* 63* 63*  CREATININE 5.36* 5.44* 5.52* 5.60* 5.39* 5.47* 5.34*  ALBUMIN 3.5  --   --   --   --   --   --   CALCIUM 8.3* 8.1* 7.9* 7.9* 7.7* 7.7* 7.8*  AST 36  --   --   --   --   --   --   ALT 18  --   --   --   --   --   --    Liver Function Tests:  Recent Labs Lab 01/17/16 2003  AST 36  ALT 18  ALKPHOS  117  BILITOT 1.6*  PROT 7.2  ALBUMIN 3.5    Recent Labs Lab 01/17/16 2003  LIPASE 65*   No results for input(s): AMMONIA in the last 168 hours. CBC:  Recent Labs Lab 01/17/16 2003 01/18/16 0455 01/22/16 0244  WBC 9.2 8.3 7.1  NEUTROABS 7.8*  --   --   HGB 9.2* 8.5* 8.2*  HCT 30.5* 29.0* 27.4*  MCV 78.0 79.2 76.5*  PLT 253 209 139*   Cardiac Enzymes: No results for input(s): CKTOTAL, CKMB, CKMBINDEX, TROPONINI in the last 168 hours. CBG:  Recent Labs Lab 01/21/16 0823 01/21/16 1258 01/21/16 1625 01/21/16 2114 01/22/16 0840  GLUCAP 118* 186* 182* 154* 121*    Iron Studies: No results for input(s): IRON, TIBC, TRANSFERRIN, FERRITIN in the last 72 hours. Studies/Results: No results found. Marland Kitchen aspirin EC  81 mg Oral Daily  . atorvastatin  40 mg Oral Daily  . furosemide  160 mg Intravenous BID  . heparin  5,000 Units Subcutaneous Q8H  .  hydrocortisone  1 application Rectal BID  . levothyroxine  112 mcg Oral QAC breakfast  . mouth rinse  15 mL Mouth Rinse BID  . metolazone  5 mg Oral Daily  . metoprolol succinate  25 mg Oral Daily  . pantoprazole  40 mg Oral Daily  . polyethylene glycol  17 g Oral Daily  . senna  1 tablet Oral QHS  . sodium bicarbonate  1,300 mg Oral TID    BMET    Component Value Date/Time   NA 139 01/22/2016 0244   NA 141 01/08/2016 1348   K 4.2 01/22/2016 0244   K 5.1 01/08/2016 1348   CL 107 01/22/2016 0244   CL 111 (H) 04/06/2012 1031   CO2 20 (L) 01/22/2016 0244   CO2 14 (L) 01/08/2016 1348   GLUCOSE 155 (H) 01/22/2016 0244   GLUCOSE 115 01/08/2016 1348   GLUCOSE 94 04/06/2012 1031   BUN 63 (H) 01/22/2016 0244   BUN 44.8 (H) 01/08/2016 1348   CREATININE 5.34 (H) 01/22/2016 0244   CREATININE 3.6 (HH) 01/08/2016 1348   CALCIUM 7.8 (L) 01/22/2016 0244   CALCIUM 8.6 01/08/2016 1348   GFRNONAA 7 (L) 01/22/2016 0244   GFRNONAA 21 (L) 10/25/2012 1158   GFRAA 8 (L) 01/22/2016 0244   GFRAA 24 (L) 10/25/2012 1158   CBC     Component Value Date/Time   WBC 7.1 01/22/2016 0244   RBC 3.58 (L) 01/22/2016 0244   HGB 8.2 (L) 01/22/2016 0244   HGB 8.8 (L) 01/08/2016 1348   HCT 27.4 (L) 01/22/2016 0244   HCT 29.1 (L) 01/08/2016 1348   PLT 139 (L) 01/22/2016 0244   PLT 184 01/08/2016 1348   PLT 166 01/31/2015 1117   MCV 76.5 (L) 01/22/2016 0244   MCV 78.0 (L) 01/08/2016 1348   MCH 22.9 (L) 01/22/2016 0244   MCHC 29.9 (L) 01/22/2016 0244   RDW 19.6 (H) 01/22/2016 0244   RDW 21.4 (H) 01/08/2016 1348   LYMPHSABS 0.5 (L) 01/17/2016 2003   LYMPHSABS 1.0 01/08/2016 1348   MONOABS 0.9 01/17/2016 2003   MONOABS 0.8 01/08/2016 1348   EOSABS 0.0 01/17/2016 2003   EOSABS 0.1 01/08/2016 1348   EOSABS 0.5 (H) 01/31/2015 1117   BASOSABS 0.0 01/17/2016 2003   BASOSABS 0.0 01/08/2016 1348     Assessment/Plan:   1. AKI on CKD3-4with baseline Cr in the 3s. Most likely due to hypoperfusion in the setting of refractory afib with RVR.  K/L light chains and SPEP were ordered and are pending, to ensure that the MGUS has not progressed. She is being diuresed with lasix 160 mg IV BID and metolazone 5 mg BID and had output of about 2000 mL yesterday with net of 896 out. Creatinine has not improved and has stayed at 5.34, and her weight is down to about 212  Lbs. We emphasized again today that pt is not a good HD candidate and patient seems to understand due to poor functional status, refractory afib and other conditions. Palliative care consult was made today, and they are moving forward with setting up family meeting. 1. [name] has moderate AS, CHF (EF 35%), and ongoing refractory A fib with RVR as well as poor functional status at baseline.  Her cardiorenal syndrome is stable, however HD will not significantly improve her underlying issues and may exacerbate them and hasten her demise.  Agree with palliative care meeting with family to set goals/limits of care. 2. Metabolic acidosis- improving, CO2 of 20 and currently  on sodium  bicarb 1300 PO TID 3. Afib with RVR: HR still in the 100-120s. And currently being transitioned to PO amiodarone. HF team following. Added toprol 25 mg, and added coumadin today  4. Acute on chronic systolic HF with reduced EF: currently being diuresed with IV lasix and metolazone, HF team following. Volume status has improved today and net output was about 2 L over past 24 hours.  5. MGUS: SPEP and serum light chains ordered and pending . She declined outpatient bone marrow biopsy in the past . 6. Iron deficiency along with Anemia of chronic disease- Hgb trending down.  Await palliative care evaluation.  Consider ESA +/- transfusion.  Would also consider IV Iron given low TSAT. 7. T2Dm and rectal pain- per primary team - recommend bowel regimen  8. History of CVA- holding eliquis due to AKI 9. Goals of care- family meeting being set up   Signed, [name] PGY 2- Internal Medicine Nephrology Service  I have seen and examined this patient and agree with plan and assessment in the above note with renal recommendations/intervention highlighted. Stacy John A Leveon Pelzer,MD 01/22/2016 11:48 AM

## 2016-01-22 NOTE — Progress Notes (Signed)
PT Cancellation Note  Patient Details Name: [name] MRN: [id number] DOB: [birth year]   Cancelled Treatment:    Reason Eval/Treat Not Completed: Fatigue/lethargy limiting ability to participate;Patient declined, no reason specified Reports not feeling up to therapy today.  Will attempt again another day.  [name] 01/22/2016, 3:27 PM  [name], [place] 01/22/2016

## 2016-01-22 NOTE — Care Management Important Message (Signed)
Important Message  Patient Details  Name: [name] MRN: [id number] Date of Birth: [birth year]   Medicare Important Message Given:  Yes    Nathen May 01/22/2016, 4:34 PM

## 2016-01-23 DIAGNOSIS — D649 Anemia, unspecified: Secondary | ICD-10-CM

## 2016-01-23 LAB — BASIC METABOLIC PANEL
ANION GAP: 19 — AB (ref 5–15)
BUN: 69 mg/dL — ABNORMAL HIGH (ref 6–20)
CALCIUM: 7.2 mg/dL — AB (ref 8.9–10.3)
CO2: 18 mmol/L — AB (ref 22–32)
CREATININE: 5.52 mg/dL — AB (ref 0.44–1.00)
Chloride: 101 mmol/L (ref 101–111)
GFR, EST AFRICAN AMERICAN: 8 mL/min — AB (ref >60)
GFR, EST NON AFRICAN AMERICAN: 7 mL/min — AB (ref >60)
Glucose, Bld: 167 mg/dL — ABNORMAL HIGH (ref 65–99)
Potassium: 4.2 mmol/L (ref 3.5–5.1)
SODIUM: 138 mmol/L (ref 135–145)

## 2016-01-23 LAB — PROTIME-INR
INR: 1.49
Prothrombin Time: 18.2 seconds — ABNORMAL HIGH (ref 11.4–15.2)

## 2016-01-23 LAB — MAGNESIUM: Magnesium: 1.6 mg/dL — ABNORMAL LOW (ref 1.7–2.4)

## 2016-01-23 MED ORDER — DARBEPOETIN ALFA 40 MCG/0.4ML IJ SOSY
40.0000 ug | PREFILLED_SYRINGE | INTRAMUSCULAR | Status: DC
  Filled 2016-01-23: qty 0.4

## 2016-01-23 MED ORDER — METOPROLOL SUCCINATE ER 25 MG PO TB24
50.0000 mg | ORAL_TABLET | Freq: Every day | ORAL | Status: DC
  Administered 2016-01-24 – 2016-01-25 (×2): 50 mg via ORAL
  Filled 2016-01-23 (×2): qty 2

## 2016-01-23 MED ORDER — WARFARIN - PHARMACIST DOSING INPATIENT
Freq: Every day | Status: DC
  Administered 2016-01-23: 18:00:00

## 2016-01-23 MED ORDER — FUROSEMIDE 10 MG/ML IJ SOLN
160.0000 mg | Freq: Three times a day (TID) | INTRAVENOUS | Status: DC
  Administered 2016-01-23 – 2016-01-25 (×7): 160 mg via INTRAVENOUS
  Filled 2016-01-23 (×9): qty 16

## 2016-01-23 MED ORDER — METOLAZONE 10 MG PO TABS
5.0000 mg | ORAL_TABLET | Freq: Two times a day (BID) | ORAL | Status: DC
  Administered 2016-01-23 – 2016-01-25 (×4): 5 mg via ORAL
  Filled 2016-01-23 (×4): qty 1

## 2016-01-23 MED ORDER — WARFARIN SODIUM 4 MG PO TABS
4.0000 mg | ORAL_TABLET | Freq: Once | ORAL | Status: AC
  Administered 2016-01-23: 4 mg via ORAL
  Filled 2016-01-23: qty 1

## 2016-01-23 MED ORDER — SODIUM CHLORIDE 0.9 % IV SOLN
510.0000 mg | INTRAVENOUS | Status: DC
  Administered 2016-01-23: 510 mg via INTRAVENOUS
  Filled 2016-01-23: qty 17

## 2016-01-23 MED ORDER — SODIUM CHLORIDE 0.9 % IV SOLN
1.0000 g | Freq: Once | INTRAVENOUS | Status: AC
  Administered 2016-01-23: 1 g via INTRAVENOUS
  Filled 2016-01-23: qty 10

## 2016-01-23 MED ORDER — GUAIFENESIN ER 600 MG PO TB12
600.0000 mg | ORAL_TABLET | Freq: Two times a day (BID) | ORAL | Status: DC
  Administered 2016-01-23 – 2016-01-25 (×5): 600 mg via ORAL
  Filled 2016-01-23 (×5): qty 1

## 2016-01-23 NOTE — Progress Notes (Signed)
Advanced Heart Failure Rounding Note   Subjective:    No complaints. Breathing fine. HR remains 100-110. Weight relatively stale. Cr 5.5  Echo reviewed EF 35%. Moderate AS   Objective:   Weight Range:  Vital Signs:   Temp:  [97.5 F (36.4 C)-98.3 F (36.8 C)] 98.1 F (36.7 C) (09/06 1922) Pulse Rate:  [96-109] 105 (09/06 1922) Resp:  [12-18] 14 (09/06 1922) BP: (100-125)/(58-88) 116/73 (09/06 1922) SpO2:  [96 %-100 %] 99 % (09/06 1922) Weight:  [97.4 kg (214 lb 11.7 oz)] 97.4 kg (214 lb 11.7 oz) (09/06 0503) Last BM Date: 01/22/16  Weight change: Filed Weights   01/21/16 0353 01/22/16 0500 01/23/16 0503  Weight: 98.3 kg (216 lb 11.4 oz) 96.6 kg (212 lb 15.4 oz) 97.4 kg (214 lb 11.7 oz)    Intake/Output:   Intake/Output Summary (Last 24 hours) at 01/23/16 2222 Last data filed at 01/23/16 1923  Gross per 24 hour  Intake              903 ml  Output             2200 ml  Net            -1297 ml     Physical Exam: General:  Chronically ill and elderly appearing. In bed.  HEENT: normal  Neck: supple. JVP 8 Cor: PMI nondisplaced. Irregular rate and rhythm, tachy. 2/6 SEM at RUSB  Lungs: Diminished  Abdomen: Obese, NT, mild distended but soft, no HSM. No bruits or masses. +BS  Extremities: no cyanosis, clubbing, rash. R and LLE tr -1+ edema up into thighs.  Neuro: alert & orientedx3, cranial nerves grossly intact. moves all 4 extremities w/o difficulty. Affect pleasant  Telemetry:  AF 100-110s  Labs: Basic Metabolic Panel:  Recent Labs Lab 01/19/16 1144 01/20/16 0320 01/21/16 0329 01/22/16 0244 01/23/16 0304 01/23/16 1116  NA 139 139 138 139 138  --   K 5.1 4.5 4.3 4.2 4.2  --   CL 115* 112* 110 107 101  --   CO2 14* 13* 17* 20* 18*  --   GLUCOSE 133* 134* 124* 155* 167*  --   BUN 63* 63* 63* 63* 69*  --   CREATININE 5.60* 5.39* 5.47* 5.34* 5.52*  --   CALCIUM 7.9* 7.7* 7.7* 7.8* 7.2*  --   MG  --   --   --   --   --  1.6*    Liver Function  Tests:  Recent Labs Lab 01/17/16 2003  AST 36  ALT 18  ALKPHOS 117  BILITOT 1.6*  PROT 7.2  ALBUMIN 3.5    Recent Labs Lab 01/17/16 2003  LIPASE 65*   No results for input(s): AMMONIA in the last 168 hours.  CBC:  Recent Labs Lab 01/17/16 2003 01/18/16 0455 01/22/16 0244  WBC 9.2 8.3 7.1  NEUTROABS 7.8*  --   --   HGB 9.2* 8.5* 8.2*  HCT 30.5* 29.0* 27.4*  MCV 78.0 79.2 76.5*  PLT 253 209 139*    Cardiac Enzymes: No results for input(s): CKTOTAL, CKMB, CKMBINDEX, TROPONINI in the last 168 hours.  BNP: BNP (last 3 results)  Recent Labs  01/17/16 2003  BNP 238.3*    ProBNP (last 3 results) No results for input(s): PROBNP in the last 8760 hours.    Other results:  Imaging: No results found.   Medications:     Scheduled Medications: . amiodarone  200 mg Oral BID  . atorvastatin  40 mg Oral Daily  . [START ON 01/24/2016] darbepoetin (ARANESP) injection - NON-DIALYSIS  40 mcg Subcutaneous Q14 Days  . ferumoxytol  510 mg Intravenous Weekly  . furosemide  160 mg Intravenous TID  . guaiFENesin  600 mg Oral BID  . heparin  5,000 Units Subcutaneous Q8H  . hydrocortisone  1 application Rectal BID  . levothyroxine  112 mcg Oral QAC breakfast  . mouth rinse  15 mL Mouth Rinse BID  . metolazone  5 mg Oral BID  . metoprolol succinate  25 mg Oral Daily  . pantoprazole  40 mg Oral Daily  . polyethylene glycol  17 g Oral Daily  . senna  1 tablet Oral QHS  . sodium bicarbonate  1,300 mg Oral TID  . Warfarin - Pharmacist Dosing Inpatient   Does not apply q1800    Infusions:    PRN Medications: acetaminophen **OR** acetaminophen, lidocaine, polyethylene glycol, senna-docusate, witch hazel-glycerin   Assessment:   1. Acute on chronic systolic CHF (LVEF 123456 09/2014) 2. 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 Chronic Afib - Acute RVR This patients CHA2DS2-VASc Score and unadjusted Ischemic Stroke Rate (% per year) is equal to 9.7 % stroke rate/year from a score  of 6  Above score calculated as 1 point each if present [CHF, HTN, DM, Vascular=MI/PAD/Aortic Plaque, Age if 65-74, or Female] Above score calculated as 2 points each if present [Age > 75, or Stroke/TIA/TE]   4. Hx of CVA 5. HTN 6. HLD 7. MGUS vs MM 8. Chronic anemia with scheduled transfusions/EPO.   9. T2DM 10. Hypotension  Plan/Discussion:     Volume status ok. Creatinine unchanged.  Renal adjusting diuretics. Per renal--> not a candidate for dialysis.    AF rate remains slightly elevated on po amio and Toprol. Increase Toprol to 50 daily. Not candidate for AV node ablation.  We will follow at a distance. Please call with questions.  Sanora Cunanan,MD 10:22 PM Advanced Heart Failure Team Pager [phone] (M-F; 7a - 4p)  Please contact [facility] for night-coverage after hours (4p -7a ) and weekends on [email or web address]

## 2016-01-23 NOTE — Progress Notes (Signed)
CSW spoke with pt dtr, [name], and provided bed offers- [name] prefers [facility] when stable for DC  CSW will continue to follow  Jorge Ny, [place] Social Worker [phone]

## 2016-01-23 NOTE — Progress Notes (Signed)
Daily Progress Note   Patient Name: [name]       Date: 01/23/2016 DOB: [birth year]  Age: 81 y.o. MRN#: [id number] Attending Physician: Axel Filler, MD Primary Care Physician: [name], MD Admit Date: 01/17/2016  Reason for Consultation/Follow-up: Establishing goals of care  Subjective: Patient awake, alert, oriented and sitting up in bed working with physical therapy. She does not want to get out of bed with them today but states "maybe tomorrow." She ate the majority of her breakfast. She denies pain or discomfort but also states she has not yet had a bowel movement. Agrees with continued support from the Palliative Medicine Team.   Length of Stay: 5  Current Medications: Scheduled Meds:  . amiodarone  200 mg Oral BID  . atorvastatin  40 mg Oral Daily  . furosemide  160 mg Intravenous TID  . guaiFENesin  600 mg Oral BID  . heparin  5,000 Units Subcutaneous Q8H  . hydrocortisone  1 application Rectal BID  . levothyroxine  112 mcg Oral QAC breakfast  . mouth rinse  15 mL Mouth Rinse BID  . metolazone  5 mg Oral Daily  . metoprolol succinate  25 mg Oral Daily  . pantoprazole  40 mg Oral Daily  . polyethylene glycol  17 g Oral Daily  . senna  1 tablet Oral QHS  . sodium bicarbonate  1,300 mg Oral TID  . warfarin  4 mg Oral ONCE-1800  . Warfarin - Pharmacist Dosing Inpatient   Does not apply q1800    Continuous Infusions:    PRN Meds: acetaminophen **OR** acetaminophen, lidocaine, polyethylene glycol, senna-docusate, witch hazel-glycerin  Physical Exam  Constitutional: She is oriented to person, place, and time. She appears well-developed. She is cooperative.  Cardiovascular: An irregularly irregular rhythm present.  Pulmonary/Chest: Effort normal. No  accessory muscle usage. No tachypnea. No respiratory distress.  Abdominal: Soft.  Neurological: She is alert and oriented to person, place, and time.  Skin: Skin is warm and dry.  Psychiatric: She has a normal mood and affect. Her speech is normal and behavior is normal. Thought content normal. Cognition and memory are normal.  Nursing note and vitals reviewed.           Vital Signs: BP 116/66   Pulse (!) 109   Temp 98.3 F (36.8  C) (Oral)   Resp 16   Ht 5' 6"  (1.676 m)   Wt 97.4 kg (214 lb 11.7 oz)   SpO2 97%   BMI 34.66 kg/m  SpO2: SpO2: 97 % O2 Device: O2 Device: Not Delivered O2 Flow Rate: O2 Flow Rate (L/min): 2 L/min  Intake/output summary:  Intake/Output Summary (Last 24 hours) at 01/23/16 0950 Last data filed at 01/23/16 0600  Gross per 24 hour  Intake           177.09 ml  Output             1850 ml  Net         -1672.91 ml   LBM: Last BM Date: 01/22/16 Baseline Weight: Weight: 94.9 kg (209 lb 3.2 oz) Most recent weight: Weight: 97.4 kg (214 lb 11.7 oz)       Palliative Assessment/Data: PPS 40%   Flowsheet Rows   Flowsheet Row Most Recent Value  Intake Tab  Referral Department  -- [internal medicine]  Unit at Time of Referral  Intermediate Care Unit  Palliative Care Primary Diagnosis  Nephrology  Date Notified  01/21/16  Palliative Care Type  New Palliative care  Reason for referral  Clarify Goals of Care  Date of Admission  01/17/16  Date first seen by Palliative Care  01/22/16  # of days Palliative referral response time  1 Day(s)  # of days IP prior to Palliative referral  4  Clinical Assessment  Psychosocial & Spiritual Assessment  Palliative Care Outcomes      Patient Active Problem List   Diagnosis Date Noted  . Palliative care encounter   . Goals of care, counseling/discussion   . DNR (do not resuscitate)   . Cardiorenal syndrome   . Chronic atrial fibrillation ([place])   . Acute systolic congestive heart failure ([place])   . Renal failure  (ARF), acute on chronic (HCC) 01/18/2016  . Acute on chronic renal failure ([place]) 01/18/2016  . Papular rash 09/19/2015  . Ulcer of left lower extremity ([place]) 09/19/2015  . Benign neoplasm of transverse colon 12/17/2014  . Gastritis   . Melena 12/12/2014  . UGI bleed 12/12/2014  . Absolute anemia   . CVA (cerebral vascular accident) ([place]) 10/01/2014  . TIA (transient ischemic attack) 09/30/2014  . Arthralgia of both hands 08/04/2014  . Preventative health care 05/03/2014  . Facet arthropathy of spine 04/26/2014  . Vitamin D deficiency 11/03/2012  . External hemorrhoids 06/28/2012  . Intermittent sleepiness 06/28/2012  . Anemia associated with chronic renal failure 04/03/2011  . MGUS (monoclonal gammopathy of unknown significance) 04/03/2011  . Atrial fibrillation ([place]) 01/24/2011  . History of osteomyelitis 09/05/2009  . Hypothyroidism 02/01/2007  . Diabetes mellitus with neuropathy ([place]) 07/03/2006  . CKD (chronic kidney disease) stage 4, GFR 15-29 ml/min (HCC) 07/03/2006  . Hyperlipidemia 03/02/2006  . Essential hypertension 03/02/2006  . VENOUS INSUFFICIENCY 03/02/2006  . EDEMA LEG 03/02/2006  . History of amputation of lesser toe ([place]) 03/02/2006    Palliative Care Assessment & Plan   Patient Profile: 81 y.o. female  with past medical history of heart failure with EF 35-40%, atrial fib, stroke, type 2 DM, hypertension, hyperlipidemia, hypothyroidism, osteomyelitis s/p bilateral toe amputations, monoclonal gammopathy of unknown significance vs. Multiple myeloma, chronic kidney disease, and anemia admitted on 01/17/2016 with progressive leg swelling, fatigue, rectal pain, and upper respiratory infection symptoms. Patient is in afib RVR exacerbating heart failure. She is on an amiodarone drip and receiving lasix. Cardiology following. Acute on  chronic renal disease and per nephrology, patient is not a suitable candidate for hemodialysis. Palliative Medicine consultation for goals of  care, anticipatory guidance, and symptom management.   Assessment: Acute on Chronic Renal Failure Afib RVR CHF  Recommendations/Plan:  DNR/DNI per patient and family.  Continue to shadow chart on patient progression and follow-up with patient and family regarding goals of care and prognosis.   Encouraged patient to participate with PT/OT.  Rectal pain-continue witch hazel-glycerin pad prn itching.   LBM 9/1-Scheduled Senna and Miralax on 9/5 x3 days. Patient has not yet had a bowel movement.   Disposition SNF with palliative services vs. Residential hospice. BUN/creat continue to worsen.   Goals of Care and Additional Recommendations:  Limitations on Scope of Treatment: Full Scope Treatment-Patient is a DNR.  Code Status:  DNR  Code Status Orders        Start     Ordered   01/22/16 1258  Do not attempt resuscitation (DNR)  Continuous    Question Answer Comment  In the event of cardiac or respiratory ARREST Do not call a "code blue"   In the event of cardiac or respiratory ARREST Do not perform Intubation, CPR, defibrillation or ACLS   In the event of cardiac or respiratory ARREST Use medication by any route, position, wound care, and other measures to relive pain and suffering. May use oxygen, suction and manual treatment of airway obstruction as needed for comfort.      01/22/16 1257    Code Status History    Date Active Date Inactive Code Status Order ID Comments User Context   01/18/2016  3:38 AM 01/22/2016 12:57 PM Full Code [id number]  Maryellen Pile, MD Inpatient   12/12/2014 11:22 PM 12/17/2014  5:50 PM Full Code [id number]  Reubin Milan, MD Inpatient   09/30/2014  6:03 PM 10/03/2014  3:36 PM Full Code [id number]  Corky Sox, MD Inpatient   04/30/2011  4:36 PM 05/07/2011  5:32 PM Full Code [id number]  Yvonna Alanis, RN Inpatient       Prognosis:   < 6 months-Could be significantly less if kidney function continues to worsen. Patient is not a dialysis  candidate.   Discharge Planning:  To Be Determined-SNF with palliative services vs. Residential hospice if kidneys continue to decline.   Care plan was discussed with Patient  Thank you for allowing the Palliative Medicine Team to assist in the care of this patient.   Time In: 0930 Time Out: 0945 Total Time 38mn Prolonged Time Billed  no       Greater than 50%  of this time was spent counseling and coordinating care related to the above assessment and plan.  MIhor Dow FNP-C Palliative Medicine Team  Phone: [phone]: [phone] Please contact Palliative Medicine Team phone at [phone] questions and concerns.

## 2016-01-23 NOTE — Progress Notes (Signed)
Subjective: Complaining of occasional cough, feels like she has trouble getting mucus up.  Coughing can "take her breath", but otherwise no dyspnea, chest pain, palpitations, fever.  Continues to report daily slight improvement in her subjective condition, but still fatigued and did not want to work with PT yesterday.  Objective:  Vital signs in last 24 hours: Vitals:   01/22/16 2100 01/22/16 2200 01/23/16 0503 01/23/16 0600  BP: (!) 107/92 (!) 109/96 109/66 (!) 100/58  Pulse: (!) 105 (!) 101 (!) 107 96  Resp: (!) 25 (!) 6 16 12   Temp:   97.5 F (36.4 C)   TempSrc:   Oral   SpO2: 90% 93% 98% 96%  Weight:   214 lb 11.7 oz (97.4 kg)   Height:       Physical Exam  Constitutional: She is oriented to person, place, and time. No distress.  Cardiovascular:  Irregularly irregular rhythm, normal S1 and S2, 2/6 systolic murmur  Pulmonary/Chest:  Slightly reduced breath sounds left lower lung field.  No crackles or wheezes.  Musculoskeletal:  Mild biLE edema to thighs Neurological: She is alert and oriented to person, place, and time.  Skin: Skin is warm and dry.  Psychiatric: She has a normal mood and affect. Her behavior is normal.   Telemetry: atrial fibrillation, rates 90-100 overnight  BMP Latest Ref Rng & Units 01/23/2016 01/22/2016 01/21/2016  Glucose 65 - 99 mg/dL 167(H) 155(H) 124(H)  BUN 6 - 20 mg/dL 69(H) 63(H) 63(H)  Creatinine 0.44 - 1.00 mg/dL 5.52(H) 5.34(H) 5.47(H)  BUN/Creat Ratio 11 - 26 - - -  Sodium 135 - 145 mmol/L 138 139 138  Potassium 3.5 - 5.1 mmol/L 4.2 4.2 4.3  Chloride 101 - 111 mmol/L 101 107 110  CO2 22 - 32 mmol/L 18(L) 20(L) 17(L)  Calcium 8.9 - 10.3 mg/dL 7.2(L) 7.8(L) 7.7(L)    Intake/Output Summary (Last 24 hours) at 01/23/16 0734 Last data filed at 01/23/16 0600  Gross per 24 hour  Intake           210.49 ml  Output             1850 ml  Net         -1639.51 ml   Filed Weights   01/21/16 0353 01/22/16 0500 01/23/16 0503  Weight: 216 lb 11.4 oz  (98.3 kg) 212 lb 15.4 oz (96.6 kg) 214 lb 11.7 oz (97.4 kg)   Echo 01/19/16: EF30-35% (35-40% in 09/2014)  Component     Latest Ref Rng & Units 06/22/2014 01/19/2016  Kappa free light chain     3.3 - 19.4 mg/L 58.00 (H) 580.4 (H)  Lamda free light chains     5.7 - 26.3 mg/L  63.3 (H)  Kappa, lamda light chain ratio     0.26 - 1.65  9.17 (H)   Component     Latest Ref Rng & Units 01/19/2016  Total Protein ELP     6.0 - 8.5 g/dL 6.3  Albumin ELP     2.9 - 4.4 g/dL 3.2  Alpha-1-Globulin     0.0 - 0.4 g/dL 0.3  Alpha-2-Globulin     0.4 - 1.0 g/dL 0.6  Beta Globulin     0.7 - 1.3 g/dL 1.0  Gamma Globulin     0.4 - 1.8 g/dL 1.2  M-SPIKE, %     Not Observed g/dL Not Observed  SPE Interp.      Comment  Comment      Comment  Globulin,  Total     2.2 - 3.9 g/dL 3.1  A/G Ratio     0.7 - 1.7 1.0    Assessment/Plan:  Principal Problem:   Acute on chronic renal failure (HCC) Active Problems:   Hypothyroidism   Diabetes mellitus with neuropathy (HCC)   Atrial fibrillation (HCC)   Cardiorenal syndrome   Chronic atrial fibrillation (HCC)   Acute systolic congestive heart failure ([place])   Palliative care encounter   Goals of care, counseling/discussion   DNR (do not resuscitate)   #Acute on Chronic Renal Failure  #Non-Gap Metabolic Acidosis Multifactorial with worsening CKD likely due to DM and MM/MGUS, with acute hypoperfusion due to AF RVR.  Her serum free light chains remain up with elevated K:L ratio.  No nephrotic range proteinuria. Her acidosis is likely hyperchloremic metabolic acidosis from her renal failure.  Her renal function has not improved in parallel with her hemodynamics.  May have established a new baseline with further irreversible injury, but renal perfusion may still improve with better rate control.  Not a candidate for maintenance HD due to her comorbidities. -Daily BMP -PO bicarb 1300 TID -Renal following  #Atrial Fibrillation with RVR Rates now controlled  90-100s, on PO metoprolol and amiodarone, without significant hypotension.  With her poor renal function, will start warfarin rather than DOAC for stroke prophylaxis. -Continue IV amiodarone infusion at 30 mg/hr per cardiology -Continue 25 mg metoprolol succinate daily per cardiology -Warfarin per pharmacy  #CHF #Volume Overload Weight up 2 lbs overnight despite 2L UOP for the last several days and net -4L and -9 lbs since admission.  BUN and Cr stable.  Baseline weight ~180 lbs on prior clinic visits.  Will further escalate diuresis with 160 mg IV furosemide TID and 5 mg metolazone BID in attempt to make further progress on her hypervolemia. -160 mg IV furosemide TID -5 mg PO metolazone BID -Follow I/Os and weights  #Anemia Mildly microcytic, last ferritin low-normal, near her baseline.  Likely multifactorial, with anemia of chronic kidney disease and myeloproliferative disorder/MM as contributors, with possibility of overlaid iron deficiency and chronic disease.  Last iron studies with low-normal ferritin but low iron and high TIBC.   Has been recieving EPO monthly (last 8/22).  Does not currently require transfusion.  #Hypocalcemia Asymptmatic, with no seizure, tetany, spasm, or long QT on telemetry.  Will replete to support cardiac contractility. -1 g CaGluc  #Hyperkalemia Resloved. -Daily BMP  #Goals of Care Discussed palliative care involvement with patient and niece yesterday.  Described that we have seen some improvement with her cardiovascular and volume status during this hospitalization, but that her renal disease is approaching end stage without option of dialysis. Daughter [name] is designated Scientist, research (medical). -Working with [place]  #DM2 -SSI  #Hypothyroidism Slightly elevated TSH -Continue home levothyroxine for now  #Rectal Pain Symptomatic treatment for external hemorrhoids. -Hydrocortisone cream, lidocaine cream, Tucks  Dispo: Anticipated  discharge in approximately 3 day(s).   Minus Liberty, MD 01/23/2016, 6:38 AM Pager: [phone]

## 2016-01-23 NOTE — Evaluation (Signed)
Occupational Therapy Evaluation Patient Details Name: [name] MRN: [id number] DOB: 09/21/1934 Today's Date: 01/23/2016    History of Present Illness 81 yo female with onset of rectal pain was admitted and found to have renal failure, CHF, L pleural effusion, uterine fibroids.  PMHx:  A-fib, CVA, DM, B toe amputations with shoes fitted with orthotics (in closet here). Palliative consult with pt undecided re: therapy vs hospice   Clinical Impression   Pt with decline in function and safety with ADLs and ADL mobility with decreased strength and endurance. Pt requires encouragement to participate. Pt would benefit from acute OT services to address impairments to increase level of function and safety    Follow Up Recommendations  SNF;Supervision/Assistance - 24 hour    Equipment Recommendations  None recommended by OT    Recommendations for Other Services       Precautions / Restrictions Precautions Precautions: Fall Required Braces or Orthoses: Other Brace/Splint Other Brace/Splint: orthotics in shoes for toe amputations Restrictions Weight Bearing Restrictions: No      Mobility Bed Mobility Overal bed mobility: Needs Assistance Bed Mobility: Rolling Rolling: Mod assist;Min assist   Supine to sit: Mod assist Sit to supine: Mod assist   General bed mobility comments: using bedrail; to Rt mod assist, to Lt min assist (pt assisted on/off bedpan--refused to try [facility] even with +2 assist)  Transfers                 General transfer comment: pt declined     Balance     Sitting balance-Leahy Scale:  (pt declined)                                      ADL Overall ADL's : Needs assistance/impaired     Grooming: Wash/dry hands;Wash/dry face;Bed level   Upper Body Bathing: Maximal assistance   Lower Body Bathing: Total assistance   Upper Body Dressing : Maximal assistance   Lower Body Dressing: Total assistance     Toilet Transfer Details  (indicate cue type and reason): pt declined Toileting- Clothing Manipulation and Hygiene: Bed level;Total assistance               Vision  wears glasses, no change from baseline              Pertinent Vitals/Pain Pain Assessment: Faces Faces Pain Scale: Hurts little more Pain Location: hemorrhoids Pain Descriptors / Indicators: Grimacing Pain Intervention(s): Monitored during session;Limited activity within patient's tolerance     Hand Dominance Right   Extremity/Trunk Assessment Upper Extremity Assessment Upper Extremity Assessment: Generalized weakness   Lower Extremity Assessment Lower Extremity Assessment: Defer to PT evaluation   Cervical / Trunk Assessment Cervical / Trunk Assessment: Kyphotic   Communication Communication Communication: No difficulties   Cognition Arousal/Alertness: Awake/alert Behavior During Therapy: WFL for tasks assessed/performed Overall Cognitive Status: Within Functional Limits for tasks assessed                     General Comments   pt pleasant                Home Living Family/patient expects to be discharged to:: Other (Comment) (pt states that she lives at an ALF/retirement community?) Living Arrangements: Alone Available Help at Discharge: Family;Available PRN/intermittently;Personal care attendant Type of Home: Apartment Home Access: Level entry     Home Layout: One level  Bathroom Shower/Tub: Teacher, early years/pre: Handicapped height     Home Equipment: Environmental consultant - 2 wheels;Cane - single point;Tub bench   Additional Comments: has help to do her bath and dressing      Prior Functioning/Environment Level of Independence: Needs assistance  Gait / Transfers Assistance Needed: RW in apt alone ADL's / Homemaking Assistance Needed: has senior apt wth caregiver for bathing and dressing        OT Diagnosis: Generalized weakness   OT Problem List: Decreased strength;Decreased activity  tolerance;Impaired balance (sitting and/or standing);Pain   OT Treatment/Interventions: Self-care/ADL training;DME and/or AE instruction;Therapeutic activities;Patient/family education    OT Goals(Current goals can be found in the care plan section) Acute Rehab OT Goals Patient Stated Goal: to feel stronger OT Goal Formulation: With patient Time For Goal Achievement: 01/30/16 Potential to Achieve Goals: Good ADL Goals Pt Will Perform Grooming: with min guard assist;sitting (EOB) Pt Will Perform Upper Body Bathing: with mod assist;sitting Pt Will Perform Lower Body Bathing: with max assist;with mod assist;sitting/lateral leans Pt Will Perform Upper Body Dressing: with mod assist;sitting Pt Will Transfer to Toilet: with max assist;with mod assist;bedside commode  OT Frequency: Min 2X/week   Barriers to D/C: Decreased caregiver support          Co-evaluation PT/OT/SLP Co-Evaluation/Treatment: Yes Reason for Co-Treatment: Other (comment) (hoping for pt to mobilize with +2 assist) PT goals addressed during session: Mobility/safety with mobility;Strengthening/ROM OT goals addressed during session: ADL's and self-care      End of Session    Activity Tolerance: Patient limited by fatigue Patient left: in bed;with call bell/phone within reach;with family/visitor present   Time: [id number] OT Time Calculation (min): 41 min Charges:  OT General Charges $OT Visit: 1 Procedure OT Evaluation $OT Eval Moderate Complexity: 1 Procedure OT Treatments $Therapeutic Activity: 8-22 mins G-Codes:    Britt Bottom 01/23/2016, 2:27 PM

## 2016-01-23 NOTE — Progress Notes (Signed)
Nephrology Service Progress Note  S: No acute events overnight.Pt eating breakfast comfortably.  Feels well overall  O:BP 116/66   Pulse (!) 109   Temp 98.3 F (36.8 C) (Oral)   Resp 16   Ht _0  (1.676 m)   Wt 214 lb 11.7 oz (97.4 kg)   SpO2 97%   BMI 34.66 kg/m   Intake/Output Summary (Last 24 hours) at 01/23/16 1146 Last data filed at 01/23/16 0900  Gross per 24 hour  Intake           417.09 ml  Output             1850 ml  Net         -1432.91 ml   Intake/Output: I/O last 3 completed shifts: In: 634.2 [P.O.:240; I.V.:262.2; IV Piggyback:132] Out: 3025 [Urine:3025]  Intake/Output this shift:  Total I/O In: 240 [P.O.:240] Out: -  Weight change: 1 lb 12.2 oz (0.8 kg)  General: Vital signs reviewed. Patient in no acute distress Cardiovascular: irregular rate.HR in the 120s. No murmurs Pulmonary/Chest: Clear to auscultation bilaterally, no wheezes or crackles Abdominal: Soft, non-tender, non-distended, BS + Extremities: b/l metatarsal amputations. Bilateral pitting edema to the knees     Recent Labs Lab 01/17/16 2003 01/18/16 0455 01/19/16 0328 01/19/16 1144 01/20/16 0320 01/21/16 0329 01/22/16 0244 01/23/16 0304  NA 139 141 139 139 139 138 139 138  K 5.1 5.0 5.5* 5.1 4.5 4.3 4.2 4.2  CL 113* 117* 113* 115* 112* 110 107 101  CO2 14* 11* 12* 14* 13* 17* 20* 18*  GLUCOSE 175* 86 85 133* 134* 124* 155* 167*  BUN 69* 60* 62* 63* 63* 63* 63* 69*  CREATININE 5.36* 5.44* 5.52* 5.60* 5.39* 5.47* 5.34* 5.52*  ALBUMIN 3.5  --   --   --   --   --   --   --   CALCIUM 8.3* 8.1* 7.9* 7.9* 7.7* 7.7* 7.8* 7.2*  AST 36  --   --   --   --   --   --   --   ALT 18  --   --   --   --   --   --   --    Liver Function Tests:  Recent Labs Lab 01/17/16 2003  AST 36  ALT 18  ALKPHOS 117  BILITOT 1.6*  PROT 7.2  ALBUMIN 3.5    Recent Labs Lab 01/17/16 2003  LIPASE 65*   No results for input(s): AMMONIA in the last 168 hours. CBC:  Recent Labs Lab  01/17/16 2003 01/18/16 0455 01/22/16 0244  WBC 9.2 8.3 7.1  NEUTROABS 7.8*  --   --   HGB 9.2* 8.5* 8.2*  HCT 30.5* 29.0* 27.4*  MCV 78.0 79.2 76.5*  PLT 253 209 139*   Cardiac Enzymes: No results for input(s): CKTOTAL, CKMB, CKMBINDEX, TROPONINI in the last 168 hours. CBG:  Recent Labs Lab 01/21/16 0823 01/21/16 1258 01/21/16 1625 01/21/16 2114 01/22/16 0840  GLUCAP 118* 186* 182* 154* 121*    Iron Studies: No results for input(s): IRON, TIBC, TRANSFERRIN, FERRITIN in the last 72 hours. Studies/Results: No results found. Marland Kitchen amiodarone  200 mg Oral BID  . atorvastatin  40 mg Oral Daily  . [START ON 01/24/2016] darbepoetin (ARANESP) injection - NON-DIALYSIS  40 mcg Subcutaneous Q14 Days  . ferumoxytol  510 mg Intravenous Weekly  . furosemide  160 mg Intravenous TID  . guaiFENesin  600 mg Oral BID  . heparin  5,000  Units Subcutaneous Q8H  . hydrocortisone  1 application Rectal BID  . levothyroxine  112 mcg Oral QAC breakfast  . mouth rinse  15 mL Mouth Rinse BID  . metolazone  5 mg Oral BID  . metoprolol succinate  25 mg Oral Daily  . pantoprazole  40 mg Oral Daily  . polyethylene glycol  17 g Oral Daily  . senna  1 tablet Oral QHS  . sodium bicarbonate  1,300 mg Oral TID  . warfarin  4 mg Oral ONCE-1800  . Warfarin - Pharmacist Dosing Inpatient   Does not apply q1800    BMET    Component Value Date/Time   NA 138 01/23/2016 0304   NA 141 01/08/2016 1348   K 4.2 01/23/2016 0304   K 5.1 01/08/2016 1348   CL 101 01/23/2016 0304   CL 111 (H) 04/06/2012 1031   CO2 18 (L) 01/23/2016 0304   CO2 14 (L) 01/08/2016 1348   GLUCOSE 167 (H) 01/23/2016 0304   GLUCOSE 115 01/08/2016 1348   GLUCOSE 94 04/06/2012 1031   BUN 69 (H) 01/23/2016 0304   BUN 44.8 (H) 01/08/2016 1348   CREATININE 5.52 (H) 01/23/2016 0304   CREATININE 3.6 (HH) 01/08/2016 1348   CALCIUM 7.2 (L) 01/23/2016 0304   CALCIUM 8.6 01/08/2016 1348   GFRNONAA 7 (L) 01/23/2016 0304   GFRNONAA 21 (L)  10/25/2012 1158   GFRAA 8 (L) 01/23/2016 0304   GFRAA 24 (L) 10/25/2012 1158   CBC    Component Value Date/Time   WBC 7.1 01/22/2016 0244   RBC 3.58 (L) 01/22/2016 0244   HGB 8.2 (L) 01/22/2016 0244   HGB 8.8 (L) 01/08/2016 1348   HCT 27.4 (L) 01/22/2016 0244   HCT 29.1 (L) 01/08/2016 1348   PLT 139 (L) 01/22/2016 0244   PLT 184 01/08/2016 1348   PLT 166 01/31/2015 1117   MCV 76.5 (L) 01/22/2016 0244   MCV 78.0 (L) 01/08/2016 1348   MCH 22.9 (L) 01/22/2016 0244   MCHC 29.9 (L) 01/22/2016 0244   RDW 19.6 (H) 01/22/2016 0244   RDW 21.4 (H) 01/08/2016 1348   LYMPHSABS 0.5 (L) 01/17/2016 2003   LYMPHSABS 1.0 01/08/2016 1348   MONOABS 0.9 01/17/2016 2003   MONOABS 0.8 01/08/2016 1348   EOSABS 0.0 01/17/2016 2003   EOSABS 0.1 01/08/2016 1348   EOSABS 0.5 (H) 01/31/2015 1117   BASOSABS 0.0 01/17/2016 2003   BASOSABS 0.0 01/08/2016 1348     Assessment/Plan:   1. AKI on CKD3-4with baseline Cr in the 3s. Most likely due to hypoperfusion in the setting of refractory afib with RVR. She is being diuresed with lasix 160 mg IV BID and metolazone 5 mg BID and had output of about 1800 mL yesterday with net of 1600 out. Creatinine has not improved and has stayed at 5.4, and her weight is down to about 213  Lbs. We emphasized again that pt is not a good HD candidate and patient seems to understand due to poor functional status, refractory afib, moderate AS, and CHF of 35%. Plan seems to be going to rehab. 2. Metabolic acidosis- improving, and currently on sodium bicarb 1300 PO TID 3. Afib with RVR: HR still in the 100-120s. And currently being transitioned to PO amiodarone. HF team following. Added toprol 25 mg, and added coumadin 4. Acute on chronic systolic HF with reduced EF: currently being diuresed with IV lasix and metolazone, HF team following. Volume status has improved today and net output was about 2  L over past 24 hours.  5. MGUS: SPEP and serum light chains ordered and pending . She  declined outpatient bone marrow biopsy in the past . 6. Iron deficiency along with Anemia of chronic disease- Hgb trending down and at     1. Giving Feraheme as well as aranesp 2. Will try to maximize anemia management to improve oxygen delivery and improve cardiac function/Afib for palliation.  Will need to be careful regarding rate of correction give her CHF but should improve cardiac perfusion and rate.     7. T2Dm and rectal pain- per primary team - recommend bowel regimen  8. History of CVA- holding eliquis due to AKI, on coumadin  9. Goals of care- pt is made DNR   Signed, [name] PGY 2- Internal Medicine Nephrology Service  I have seen and examined this patient and agree with plan and assessment in the above note with renal recommendations/intervention highlighted. Agree with DNR and conservative measures.  Would not offer HD as this would not significantly improve her other chronic disease processes and would further worsen her functional status.   [name] Alexcis Bicking,MD 01/23/2016 1:04 PM

## 2016-01-23 NOTE — Progress Notes (Signed)
Physical Therapy Treatment Patient Details Name: [name] MRN: [id number] DOB: 1934-09-13 Today's Date: 01/23/2016    History of Present Illness 81 yo female with onset of rectal pain was admitted and found to have renal failure, CHF, L pleural effusion, uterine fibroids.  PMHx:  A-fib, CVA, DM, B toe amputations with shoes fitted with orthotics (in closet here). Palliative consult with pt undecided re: therapy vs hospice    PT Comments    Patient initially refusing to attempt therapy (although very aware that the longer she puts it off, the weaker she will be). Agreed to do bed level exercises. OT then arrived and pt initially agreed to get OOB to [facility], however as preparing equipment, she then insisted on bedpan. Overall, feel patient wants to be able to do therapy and go to SNF (as she has done before) and is struggling with decision re: palliative care or hospice or rehab. She asked that therapy continue to come and she hopes to feel strong enough to do more "soon."   Follow Up Recommendations  SNF     Equipment Recommendations  None recommended by PT    Recommendations for Other Services       Precautions / Restrictions Precautions Precautions: Fall (telemetry) Required Braces or Orthoses: Other Brace/Splint (requires orthotics in her shoes which daughter took home for) Other Brace/Splint: orthotics in shoes for toe amputations Restrictions Weight Bearing Restrictions: No    Mobility  Bed Mobility Overal bed mobility: Needs Assistance Bed Mobility: Rolling Rolling: Mod assist;Min assist         General bed mobility comments: using bedrail; to Rt mod assist, to Lt min assist (pt assisted on/off bedpan--refused to try [facility] even with +2 assist)  Transfers                 General transfer comment: pt declined   Ambulation/Gait                 Stairs            Wheelchair Mobility    Modified Rankin (Stroke Patients Only)       Balance                                     Cognition Arousal/Alertness: Awake/alert Behavior During Therapy: WFL for tasks assessed/performed Overall Cognitive Status: Within Functional Limits for tasks assessed                      Exercises General Exercises - Upper Extremity Shoulder Flexion: AROM;Both;5 reps Digit Composite Flexion: AROM;Both;10 reps General Exercises - Lower Extremity Heel Slides: AROM;Both (3 reps--then stated she needed bed pan for BM)    General Comments General comments (skin integrity, edema, etc.): Feels her legs are too swollen for attempting standing. Very familiar with therapies and short-term SNF as has done this twice before. Seems she does not want to give up on idea of rehab, however doesn't feel she is "strong enough" to try OOB.      Pertinent Vitals/Pain Pain Assessment: Faces Faces Pain Scale: Hurts little more Pain Location: rectal/hemmorhoids Pain Descriptors / Indicators: Grimacing Pain Intervention(s): Limited activity within patient's tolerance;Monitored during session    Home Living                      Prior Function  PT Goals (current goals can now be found in the care plan section) Acute Rehab PT Goals Patient Stated Goal: to feel stronger Time For Goal Achievement: 02/01/16 Progress towards PT goals: Not progressing toward goals - comment    Frequency  Min 2X/week    PT Plan Current plan remains appropriate    Co-evaluation PT/OT/SLP Co-Evaluation/Treatment: Yes (OT joined PT after bed exercises) Reason for Co-Treatment: Other (comment) (pt with limited reserves; hopeful 2 person bed to [facility]) PT goals addressed during session: Mobility/safety with mobility;Strengthening/ROM       End of Session   Activity Tolerance: Patient limited by fatigue;Other (comment) (also self-limiiting) Patient left: in bed;with call bell/phone within reach;with bed alarm set     Time: [id number] PT  Time Calculation (min) (ACUTE ONLY): 49 min  Charges:  $Therapeutic Exercise: 8-22 mins                    G Codes:      Brannen Koppen 02-19-2016, 10:45 AM  Pager [phone]

## 2016-01-23 NOTE — Progress Notes (Signed)
ANTICOAGULATION CONSULT NOTE - Initial Consult  Pharmacy Consult for Coumadin Indication: atrial fibrillation  No Known Allergies  Patient Measurements: Height: _0  (167.6 cm) Weight: 214 lb 11.7 oz (97.4 kg) IBW/kg (Calculated) : 59.3  Vital Signs: Temp: 97.5 F (36.4 C) (09/06 0503) Temp Source: Oral (09/06 0503) BP: 100/58 (09/06 0600) Pulse Rate: 96 (09/06 0600)  Labs:  Recent Labs  01/21/16 0329 01/22/16 0244 01/23/16 0304  HGB  --  8.2*  --   HCT  --  27.4*  --   PLT  --  139*  --   CREATININE 5.47* 5.34* 5.52*    Estimated Creatinine Clearance: 9.9 mL/min (by C-G formula based on SCr of 5.52 mg/dL).   Medical History: Past Medical History:  Diagnosis Date  . Anemia    b12 def, iron def, follow at cancer center, gets B12 and  another injection there. Could not remeber the name. Dr. Ralene Ok is her  cancer doctor  . Asthma   . Atrial fibrillation ([place])   . Blood transfusion    two or more yrs ago  . Chronic renal insufficiency   . Chronic venous insufficiency   . Diabetes mellitus   . Hyperlipidemia   . Hypertension   . Hypothyroidism    thyroid removed 4 or more yrs ago  . MGUS (monoclonal gammopathy of unknown significance)   . Osteomyelitis ([place])    s/p Rt 2nd toe and left 5 toes  amputation in 1/12  by Dr. Sharol Given  . Stroke ([place])   . Thyroid disease    hypothyroidism h/o hyperthyroidism s/p ablation/ectomy    Medications:  Prescriptions Prior to Admission  Medication Sig Dispense Refill Last Dose  . atorvastatin (LIPITOR) 40 MG tablet TAKE 1 TABLET BY MOUTH EVERY DAY 90 tablet 3 01/16/2016 at Unknown time  . calcitRIOL (ROCALTROL) 0.25 MCG capsule Take 1 capsule by mouth every Monday, Wednesday, and Friday.  5 01/16/2016 at Unknown time  . cetirizine (ZYRTEC) 10 MG chewable tablet Chew 1 tablet (10 mg total) by mouth daily. (Patient taking differently: Chew 10 mg by mouth daily as needed for allergies. ) 30 tablet 0 01/16/2016 at Unknown time  .  ELIQUIS 5 MG TABS tablet TAKE 1 TABLET(5 MG) BY MOUTH TWICE DAILY 60 tablet 2 01/16/2016 at Unknown time  . furosemide (LASIX) 20 MG tablet TAKE 1 TABLET(20 MG) BY MOUTH DAILY (Patient taking differently: TAKE 1 TABLET(20 MG) BY MOUTH TWICE DAILY.) 30 tablet 6 01/17/2016 at Unknown time  . glipiZIDE (GLUCOTROL) 10 MG tablet Take 0.5 tablets (5 mg total) by mouth 2 (two) times daily with a meal. 60 tablet 3 01/17/2016 at Unknown time  . JANUVIA 25 MG tablet TAKE 1 TABLET BY MOUTH EVERY DAY 90 tablet 2 01/17/2016 at Unknown time  . levothyroxine (SYNTHROID, LEVOTHROID) 112 MCG tablet TAKE 1 TABLET(112 MCG) BY MOUTH DAILY BEFORE BREAKFAST 30 tablet 6 01/17/2016 at Unknown time  . lisinopril (PRINIVIL,ZESTRIL) 40 MG tablet TAKE 1 TABLET BY MOUTH EVERY DAY 90 tablet 3 01/17/2016 at Unknown time  . metoprolol tartrate (LOPRESSOR) 25 MG tablet Take 1 tablet (25 mg total) by mouth 2 (two) times daily. 180 tablet 3 01/17/2016 at Unknown time  . pantoprazole (PROTONIX) 40 MG tablet TAKE 1 TABLET BY MOUTH EVERY DAY 90 tablet 3 01/16/2016 at Unknown time  . polyethylene glycol (MIRALAX / GLYCOLAX) packet Take 17 g by mouth daily as needed for mild constipation or moderate constipation.    unknown  . sennosides-docusate sodium (SENOKOT-S)  8.6-50 MG tablet Take 2 tablets by mouth daily as needed for constipation.    unknown  . UNABLE TO FIND Inject 1 each into the skin every 28 (twenty-eight) days. Vitamin D injection monthly at the cancer center.   Past Month at Unknown time  . Blood Glucose Monitoring Suppl (ONETOUCH VERIO FLEX SYSTEM) W/DEVICE KIT 1 each by Does not apply route every morning. Check blood sugar one time each day 1 kit 1 Taking  . Cholecalciferol (VITAMIN D3) 10000 UNITS capsule Take 1 capsule (10,000 Units total) by mouth once a week. (Patient not taking: Reported on 01/17/2016) 4 capsule 3 Not Taking at Unknown time  . clotrimazole (LOTRIMIN) 1 % cream Apply 1 application topically 2 (two) times daily.  (Patient not taking: Reported on 01/17/2016) 30 g 0 Not Taking at Unknown time  . glucose blood (ONETOUCH VERIO) test strip Check blood sugar one time each day 50 each 5 Taking  . ONETOUCH DELICA LANCETS FINE MISC Check blood sugar one time each day 100 each 5 Taking    Assessment: 81 y.o. female with Afib previously on Eliquis, now with worsening CKD, for Coumadin.  On Amiodarone for rate control and on SQ heparin for DVT prophylaxis until INR > 2  Goal of Therapy:  INR 2-3 Monitor platelets by anticoagulation protocol: Yes   Plan:  Coumadin 4 mg today Daily INR  Jamese Trauger, Bronson Curb 01/23/2016,7:19 AM

## 2016-01-24 DIAGNOSIS — I481 Persistent atrial fibrillation: Secondary | ICD-10-CM

## 2016-01-24 LAB — MAGNESIUM: MAGNESIUM: 1.6 mg/dL — AB (ref 1.7–2.4)

## 2016-01-24 LAB — BASIC METABOLIC PANEL
ANION GAP: 14 (ref 5–15)
BUN: 70 mg/dL — ABNORMAL HIGH (ref 6–20)
CHLORIDE: 103 mmol/L (ref 101–111)
CO2: 21 mmol/L — AB (ref 22–32)
Calcium: 7.5 mg/dL — ABNORMAL LOW (ref 8.9–10.3)
Creatinine, Ser: 5.3 mg/dL — ABNORMAL HIGH (ref 0.44–1.00)
GFR calc non Af Amer: 7 mL/min — ABNORMAL LOW (ref >60)
GFR, EST AFRICAN AMERICAN: 8 mL/min — AB (ref >60)
Glucose, Bld: 150 mg/dL — ABNORMAL HIGH (ref 65–99)
Potassium: 4.1 mmol/L (ref 3.5–5.1)
Sodium: 138 mmol/L (ref 135–145)

## 2016-01-24 LAB — PROTIME-INR
INR: 1.6
Prothrombin Time: 19.2 seconds — ABNORMAL HIGH (ref 11.4–15.2)

## 2016-01-24 LAB — CBC
HCT: 26.4 % — ABNORMAL LOW (ref 36.0–46.0)
HEMOGLOBIN: 8 g/dL — AB (ref 12.0–15.0)
MCH: 22.9 pg — ABNORMAL LOW (ref 26.0–34.0)
MCHC: 30.3 g/dL (ref 30.0–36.0)
MCV: 75.4 fL — ABNORMAL LOW (ref 78.0–100.0)
Platelets: 104 10*3/uL — ABNORMAL LOW (ref 150–400)
RBC: 3.5 MIL/uL — AB (ref 3.87–5.11)
RDW: 19.6 % — ABNORMAL HIGH (ref 11.5–15.5)
WBC: 6.1 10*3/uL (ref 4.0–10.5)

## 2016-01-24 MED ORDER — WARFARIN SODIUM 4 MG PO TABS
4.0000 mg | ORAL_TABLET | Freq: Once | ORAL | Status: AC
  Administered 2016-01-24: 4 mg via ORAL
  Filled 2016-01-24: qty 1

## 2016-01-24 MED ORDER — CALCIUM GLUCONATE 10 % IV SOLN
1.0000 g | Freq: Once | INTRAVENOUS | Status: AC
  Administered 2016-01-24: 1 g via INTRAVENOUS
  Filled 2016-01-24: qty 10

## 2016-01-24 MED ORDER — DARBEPOETIN ALFA 200 MCG/0.4ML IJ SOSY
200.0000 ug | PREFILLED_SYRINGE | INTRAMUSCULAR | Status: DC
  Administered 2016-01-24: 200 ug via SUBCUTANEOUS
  Filled 2016-01-24: qty 0.4

## 2016-01-24 NOTE — Progress Notes (Signed)
Subjective: No acute events overnight.  Continues to claim slight symptomatic improvement every day with her fatigue.  Objective:  Vital signs in last 24 hours: Vitals:   01/24/16 0700 01/24/16 0805 01/24/16 1107 01/24/16 1128  BP:  112/81 (!) 98/49 (!) 98/49  Pulse:  97 (!) 111 (!) 105  Resp:  15  20  Temp:  98.3 F (36.8 C)  97.5 F (36.4 C)  TempSrc: Oral Oral  Axillary  SpO2:  97%  99%  Weight:      Height:       Physical Exam  Constitutional: She is oriented to person, place, and time. No distress.  Cardiovascular:  Irregularly irregular rhythm, normal S1 and S2, 2/6 systolic murmur  Pulmonary/Chest:  No crackles or wheezes.  Musculoskeletal:  Mild biLE edema to thighs Neurological: She is alert and oriented to person, place, and time.  Skin: Skin is warm and dry.  Psychiatric: She has a normal mood and affect. Her behavior is normal.   Telemetry: atrial fibrillation, rates 90-100 overnight  BMP Latest Ref Rng & Units 01/24/2016 01/23/2016 01/22/2016  Glucose 65 - 99 mg/dL 150(H) 167(H) 155(H)  BUN 6 - 20 mg/dL 70(H) 69(H) 63(H)  Creatinine 0.44 - 1.00 mg/dL 5.30(H) 5.52(H) 5.34(H)  BUN/Creat Ratio 11 - 26 - - -  Sodium 135 - 145 mmol/L 138 138 139  Potassium 3.5 - 5.1 mmol/L 4.1 4.2 4.2  Chloride 101 - 111 mmol/L 103 101 107  CO2 22 - 32 mmol/L 21(L) 18(L) 20(L)  Calcium 8.9 - 10.3 mg/dL 7.5(L) 7.2(L) 7.8(L)    Intake/Output Summary (Last 24 hours) at 01/24/16 1209 Last data filed at 01/24/16 0900  Gross per 24 hour  Intake             1209 ml  Output             1875 ml  Net             -666 ml   Filed Weights   01/22/16 0500 01/23/16 0503 01/24/16 0500  Weight: 212 lb 15.4 oz (96.6 kg) 214 lb 11.7 oz (97.4 kg) 204 lb 12.9 oz (92.9 kg)     Assessment/Plan:  Principal Problem:   Acute on chronic renal failure (HCC) Active Problems:   Hypothyroidism   Diabetes mellitus with neuropathy (HCC)   Atrial fibrillation (HCC)   Cardiorenal syndrome  Chronic atrial fibrillation (HCC)   Acute systolic congestive heart failure ([place])   Palliative care encounter   Goals of care, counseling/discussion   DNR (do not resuscitate)   #Acute on Chronic Renal Failure  #Non-Gap Metabolic Acidosis Multifactorial with worsening CKD likely due to DM and MM/MGUS, with acute hypoperfusion due to AF RVR.  Her serum free light chains remain up with elevated K:L ratio.  No nephrotic range proteinuria. Her acidosis is likely hyperchloremic metabolic acidosis from her renal failure.  Her renal function has not improved in parallel with her hemodynamics.  May have established a new baseline with further irreversible injury, but renal perfusion may still improve with better rate control.  Not a candidate for maintenance HD due to her comorbidities. -Daily BMP -PO bicarb 1300 TID  #Atrial Fibrillation with RVR Rates now controlled 90-100s, on PO metoprolol and amiodarone, without significant hypotension.  With her poor renal function, will start warfarin rather than DOAC for stroke prophylaxis. -Continue IV amiodarone infusion at 30 mg/hr per cardiology -50 mg metoprolol succinate daily per cardiology -Warfarin per pharmacy  #CHF #Volume Overload  Weight down 10 lbs since yesterday with   BUN and Cr stable.  Baseline weight ~180 lbs on prior clinic visits.  Responded to escalation of diuresis with increased UOP. -160 mg IV furosemide TID -5 mg PO metolazone BID -Fluid restrict 1200 mL -Follow I/Os and weights  #Anemia Mildly microcytic, last ferritin low-normal, near her baseline.  Likely multifactorial, with anemia of chronic kidney disease and myeloproliferative disorder/MM as contributors, with possibility of overlaid iron deficiency and chronic disease.  Last iron studies with low-normal ferritin but low iron and high TIBC.   Has been recieving EPO monthly (last 8/22).  Does not currently require transfusion.  #Hypocalcemia Asymptmatic, with no seizure,  tetany, spasm, or long QT on telemetry.  Will replete to support cardiac contractility. -1 g CaGluc  #Hyperkalemia Resloved. -Daily BMP  #Disposition Understands plan to discharge to SNF and hopefully get her home for some amount of time.  DNR, understands severity of her medical conditions. -Appreciate Palliative Care's involvement -Awaiting SNF placement  #DM2 -SSI  #Hypothyroidism Slightly elevated TSH -Continue home levothyroxine  #Rectal Pain Symptomatic treatment for external hemorrhoids. -Hydrocortisone cream, lidocaine cream, Tucks  Dispo: Anticipated discharge in approximately 3 day(s).   Minus Liberty, MD 01/24/2016, 12:09 PM Pager: [phone]

## 2016-01-24 NOTE — Discharge Summary (Signed)
Name: [name] MRN: [id number] DOB: 1934/09/27 81 y.o. PCP: [name], MD  Date of Admission: 01/17/2016  6:03 PM Date of Discharge: 01/25/2016 Attending Physician: Axel Filler, MD  Discharge Diagnosis:  Principal Problem:   Acute on chronic renal failure [facility]) Active Problems:   Hypothyroidism   Diabetes mellitus with neuropathy ([place])   Atrial fibrillation ([place])   Cardiorenal syndrome   Chronic atrial fibrillation ([place])   Acute systolic congestive heart failure ([place])   Palliative care encounter   Goals of care, counseling/discussion   DNR (do not resuscitate)   Discharge Medications:   Medication List    STOP taking these medications   ELIQUIS 5 MG Tabs tablet Generic drug:  apixaban   lisinopril 40 MG tablet Commonly known as:  PRINIVIL,ZESTRIL   metoprolol tartrate 25 MG tablet Commonly known as:  LOPRESSOR     TAKE these medications   acetaminophen 325 MG tablet Commonly known as:  TYLENOL Take 2 tablets (650 mg total) by mouth every 6 (six) hours as needed for mild pain (or Fever >/= 101).   amiodarone 200 MG tablet Commonly known as:  PACERONE Take 1 tablet (200 mg total) by mouth 2 (two) times daily.   atorvastatin 40 MG tablet Commonly known as:  LIPITOR TAKE 1 TABLET BY MOUTH EVERY DAY   calcitRIOL 0.25 MCG capsule Commonly known as:  ROCALTROL Take 1 capsule by mouth every Monday, Wednesday, and Friday.   cetirizine 10 MG chewable tablet Commonly known as:  ZYRTEC Chew 1 tablet (10 mg total) by mouth daily. What changed:  when to take this  reasons to take this   clotrimazole 1 % cream Commonly known as:  LOTRIMIN Apply 1 application topically 2 (two) times daily.   furosemide 80 MG tablet Commonly known as:  LASIX Take 1 tablet (80 mg total) by mouth 2 (two) times daily. What changed:  See the new instructions.   glipiZIDE 10 MG tablet Commonly known as:  GLUCOTROL Take 0.5 tablets (5 mg total) by mouth 2 (two)  times daily with a meal.   glucose blood test strip Commonly known as:  ONETOUCH VERIO Check blood sugar one time each day   hydrocortisone 2.5 % rectal cream Commonly known as:  ANUSOL-HC Place 1 application rectally 2 (two) times daily.   JANUVIA 25 MG tablet Generic drug:  sitaGLIPtin TAKE 1 TABLET BY MOUTH EVERY DAY   levothyroxine 112 MCG tablet Commonly known as:  SYNTHROID, LEVOTHROID TAKE 1 TABLET(112 MCG) BY MOUTH DAILY BEFORE BREAKFAST   lidocaine 4 % cream Commonly known as:  LMX Apply topically 2 (two) times daily as needed (rectal pain).   metolazone 5 MG tablet Commonly known as:  ZAROXOLYN Take 1 tablet (5 mg total) by mouth daily. 30 minutes before AM lasix   metoprolol succinate 25 MG 24 hr tablet Commonly known as:  TOPROL-XL Take 3 tablets (75 mg total) by mouth daily.   ONETOUCH DELICA LANCETS FINE Misc Check blood sugar one time each day   ONETOUCH VERIO FLEX SYSTEM w/Device Kit 1 each by Does not apply route every morning. Check blood sugar one time each day   pantoprazole 40 MG tablet Commonly known as:  PROTONIX TAKE 1 TABLET BY MOUTH EVERY DAY   polyethylene glycol packet Commonly known as:  MIRALAX / GLYCOLAX Take 17 g by mouth daily as needed for mild constipation or moderate constipation.   sennosides-docusate sodium 8.6-50 MG tablet Commonly known as:  SENOKOT-S Take  2 tablets by mouth daily as needed for constipation.   sodium bicarbonate 650 MG tablet Take 2 tablets (1,300 mg total) by mouth 3 (three) times daily.   UNABLE TO FIND Inject 1 each into the skin every 28 (twenty-eight) days. Vitamin D injection monthly at the cancer center.   Vitamin D3 10000 units capsule Take 1 capsule (10,000 Units total) by mouth once a week.   warfarin 5 MG tablet Commonly known as:  COUMADIN Take 1 tablet (5 mg total) by mouth daily.   witch hazel-glycerin pad Commonly known as:  TUCKS Apply topically as needed for itching.        Disposition and follow-up:   [name] was discharged from [facility] in fair condition.  At the hospital follow up visit please address:  1.  Anticoagulation.  Check INR, recently started on warfarin, taking 4 mg once daily.  2.  Afib rate control.  Titrate metoprolol succinate as appropriate.  3.  Volume status.  Adjust home diuretic regimen as needed.  4.  Palliative care.  Ensure appropriate Palliative Medicine services at [facility] and home.  5.  Labs / imaging needed at time of follow-up: none  6.  Pending labs/ test needing follow-up: none; needs INR check  Follow-up Appointments: Follow-up Information    [name], MD. Schedule an appointment as soon as possible for a visit on 01/30/2016.   Specialty:  Internal Medicine Why:  at 10:15 am Contact information: [place] [address] [phone]        Sherril Croon, MD Follow up on 02/12/2016.   Specialty:  Nephrology Why:  10:00 am Contact information: [place] [address] [phone]           Hospital Course by problem list: Principal Problem:   Acute on chronic renal failure ([place]) Active Problems:   Hypothyroidism   Diabetes mellitus with neuropathy (HCC)   Atrial fibrillation (HCC)   Cardiorenal syndrome   Chronic atrial fibrillation (HCC)   Acute systolic congestive heart failure ([place])   Palliative care encounter   Goals of care, counseling/discussion   DNR (do not resuscitate)   1. Atrial fibrillation with rapid ventricular response [name] has chronic AF, and was admitted with rates in 120s-130s.  Prior to admission, she had not been regularly compliant with any of her medicines, including her beta blocker.  In the setting of heart failure exacerbation, she was first rate controlled with amiodarone infusion.  Her rates decreased and BPs improved.  Her BPs then permitted the reintroduction of a beta blocker, which further helped her rate  control.  With her severe renal disease, her DOAC was discontinued and she was started on warfarin.  2. Acute on chronic renal failure Her Creatinine was elevated to above 5 from her baseline of 3 - 3.5 at admission, which likely now reflects her new baseline.  She was aggressively diuresed with IV lasix (160 mg TID) and PO metolazone (5 mg BID), and managed to put out ~2L of urine daily with improvement in her volume status.  Nephrology was consulted, and determined that she was not a candidate for dialysis due to her comorbidities.  Started on oral bicarbonate for metabolic acidosis, and continued on her phos binder and calcimimetic.  She also received EPO and IV iron for her anemia of chronic renal disease.  3. Heart failure  She presented volume overloaded and 15-20 lbs above her baseline weight.  AF with FVR  deemed the inciting cause of her heart failure exacerbation.  With rate control and diuresis, her hemodynamics and volume status improved.  4. Goals of Care and Palliative Medicine She has a limited with her renal failure without the option for dialysis.  Goals of care were addressed during this admission with the assistance of Palliative Medicine.  She understands the severity of her disease and lack of disease-altering treatments available.  She chose to be DNR, and would like to focus on symptom management and quality of life in the future.  She will continue to work with Palliative Medicine at the rehab facility for symptom management, and arrange for [place] or [place] services as appropriate subsequently.  Family also understands the plan.  5. Anemia Stable or slightly downtrending Hgb over the admission.  Likely multifactorial, with major contributions of anemia of chronic renal disease, anemia of chronic disease, and possible multiple myeloma.  Received EPO and IV iron on 9/7.  6. Rectal Pain From external hemorrhoid.  Treated symptomatically with hydrocortisone cream,  lidocaine gel, and Tucks pads.  Pain improved.  Discharge Vitals:   BP (!) 116/94   Pulse (!) 111   Temp 98 F (36.7 C) (Oral)   Resp 16   Ht 5' 6"  (1.676 m)   Wt 202 lb 13.2 oz (92 kg)   SpO2 97%   BMI 32.74 kg/m   Pertinent Labs, Studies, and Procedures:   CBC Latest Ref Rng & Units 01/24/2016 01/22/2016 01/18/2016  WBC 4.0 - 10.5 K/uL 6.1 7.1 8.3  Hemoglobin 12.0 - 15.0 g/dL 8.0(L) 8.2(L) 8.5(L)  Hematocrit 36.0 - 46.0 % 26.4(L) 27.4(L) 29.0(L)  Platelets 150 - 400 K/uL 104(L) 139(L) 209    BMP Latest Ref Rng & Units 01/25/2016 01/24/2016 01/23/2016  Glucose 65 - 99 mg/dL 162(H) 150(H) 167(H)  BUN 6 - 20 mg/dL 72(H) 70(H) 69(H)  Creatinine 0.44 - 1.00 mg/dL 5.35(H) 5.30(H) 5.52(H)  BUN/Creat Ratio 11 - 26 - - -  Sodium 135 - 145 mmol/L 137 138 138  Potassium 3.5 - 5.1 mmol/L 4.1 4.1 4.2  Chloride 101 - 111 mmol/L 99(L) 103 101  CO2 22 - 32 mmol/L 24 21(L) 18(L)  Calcium 8.9 - 10.3 mg/dL 7.8(L) 7.5(L) 7.2(L)   Component     Latest Ref Rng & Units 01/23/2016 01/24/2016 01/25/2016  Magnesium     1.7 - 2.4 mg/dL 1.6 (L) 1.6 (L) 1.6 (L)    Component     Latest Ref Rng & Units 01/23/2016 01/24/2016 01/25/2016  INR      1.49 1.60 1.52    Discharge Instructions: Discharge Instructions    Consult to palliative care    Complete by:  As directed   Diet - low sodium heart healthy    Complete by:  As directed   Increase activity slowly    Complete by:  As directed      You were seen in the hospital with several problems causing you to build up fluid and feel tired.  First, your heart was going too fast, in a rhythm call atrial fibrillation.  This hurt your kidneys, which were already not working well to begin with.  The combination of your heart failure and kidney failure led to fluid build up.  While in the hospital, we have controlled your heart rate and gotten some of fluid off by giving you medicines to help you urinate.  However, your kidney function has continue to be bad, and dialysis is  not an option  for you with your heart conditions.  We have also started you on warfarin, a blood thinner, to reduce the risk of having another stroke in the future.  You well go to the [facility] rehab facility to try and get stronger.  Palliative Care doctors will continue to work with you to make you as comfortable as possible.    Signed: Minus Liberty, MD 01/25/2016, 12:35 PM   Pager: [phone]

## 2016-01-24 NOTE — Care Management Important Message (Signed)
Important Message  Patient Details  Name: [name] MRN: [id number] Date of Birth: 04-29-1935   Medicare Important Message Given:  Yes    [name] [name] 01/24/2016, 10:25 AM

## 2016-01-24 NOTE — Progress Notes (Signed)
Nephrology Service Progress Note  S: She continues to feel fair with some slight PT participation, eating breakfast this morning.   O:BP (!) 98/49   Pulse (!) 105   Temp 97.5 F (36.4 C) (Axillary)   Resp 20   Ht 5\' 6"  (1.676 m)   Wt 204 lb 12.9 oz (92.9 kg)   SpO2 99%   BMI 33.06 kg/m   Intake/Output Summary (Last 24 hours) at 01/24/16 1159 Last data filed at 01/24/16 0900  Gross per 24 hour  Intake             1209 ml  Output             1875 ml  Net             -666 ml   Intake/Output: I/O last 3 completed shifts: In: 1209 [P.O.:960; IV Piggyback:249] Out: 3525 [Urine:3525]  Intake/Output this shift:  Total I/O In: 240 [P.O.:240] Out: -  Weight change: -9 lb 14.7 oz (-4.5 kg)  General: Lying in bed in no acute distress Cardiovascular: Tachycardic with irregular rhythm. No murmurs Pulmonary/Chest: CTAB, no wheezes or crackles Abdominal: Soft, non-distended Extremities: Pitting edema to the knees without skin changes, old well healed metatarsal amputations b/l    Recent Labs Lab 01/17/16 2003  01/19/16 0328 01/19/16 1144 01/20/16 0320 01/21/16 0329 01/22/16 0244 01/23/16 0304 01/24/16 0305  NA 139  < > 139 139 139 138 139 138 138  K 5.1  < > 5.5* 5.1 4.5 4.3 4.2 4.2 4.1  CL 113*  < > 113* 115* 112* 110 107 101 103  CO2 14*  < > 12* 14* 13* 17* 20* 18* 21*  GLUCOSE 175*  < > 85 133* 134* 124* 155* 167* 150*  BUN 69*  < > 62* 63* 63* 63* 63* 69* 70*  CREATININE 5.36*  < > 5.52* 5.60* 5.39* 5.47* 5.34* 5.52* 5.30*  ALBUMIN 3.5  --   --   --   --   --   --   --   --   CALCIUM 8.3*  < > 7.9* 7.9* 7.7* 7.7* 7.8* 7.2* 7.5*  AST 36  --   --   --   --   --   --   --   --   ALT 18  --   --   --   --   --   --   --   --   < > = values in this interval not displayed. Liver Function Tests:  Recent Labs Lab 01/17/16 2003  AST 36  ALT 18  ALKPHOS 117  BILITOT 1.6*  PROT 7.2  ALBUMIN 3.5    Recent Labs Lab 01/17/16 2003  LIPASE 65*   No results for  input(s): AMMONIA in the last 168 hours. CBC:  Recent Labs Lab 01/17/16 2003 01/18/16 0455 01/22/16 0244 01/24/16 0828  WBC 9.2 8.3 7.1 6.1  NEUTROABS 7.8*  --   --   --   HGB 9.2* 8.5* 8.2* 8.0*  HCT 30.5* 29.0* 27.4* 26.4*  MCV 78.0 79.2 76.5* 75.4*  PLT 253 209 139* 104*   Cardiac Enzymes: No results for input(s): CKTOTAL, CKMB, CKMBINDEX, TROPONINI in the last 168 hours. CBG:  Recent Labs Lab 01/21/16 0823 01/21/16 1258 01/21/16 1625 01/21/16 2114 01/22/16 0840  GLUCAP 118* 186* 182* 154* 121*    Iron Studies: No results for input(s): IRON, TIBC, TRANSFERRIN, FERRITIN in the last 72 hours. Studies/Results: No results found. Marland Kitchen amiodarone  200 mg Oral BID  . atorvastatin  40 mg Oral Daily  . darbepoetin (ARANESP) injection - NON-DIALYSIS  200 mcg Subcutaneous Q14 Days  . ferumoxytol  510 mg Intravenous Weekly  . furosemide  160 mg Intravenous TID  . guaiFENesin  600 mg Oral BID  . heparin  5,000 Units Subcutaneous Q8H  . hydrocortisone  1 application Rectal BID  . levothyroxine  112 mcg Oral QAC breakfast  . mouth rinse  15 mL Mouth Rinse BID  . metolazone  5 mg Oral BID  . metoprolol succinate  50 mg Oral Daily  . pantoprazole  40 mg Oral Daily  . senna  1 tablet Oral QHS  . sodium bicarbonate  1,300 mg Oral TID  . warfarin  4 mg Oral ONCE-1800  . Warfarin - Pharmacist Dosing Inpatient   Does not apply q1800    BMET    Component Value Date/Time   NA 138 01/24/2016 0305   NA 141 01/08/2016 1348   K 4.1 01/24/2016 0305   K 5.1 01/08/2016 1348   CL 103 01/24/2016 0305   CL 111 (H) 04/06/2012 1031   CO2 21 (L) 01/24/2016 0305   CO2 14 (L) 01/08/2016 1348   GLUCOSE 150 (H) 01/24/2016 0305   GLUCOSE 115 01/08/2016 1348   GLUCOSE 94 04/06/2012 1031   BUN 70 (H) 01/24/2016 0305   BUN 44.8 (H) 01/08/2016 1348   CREATININE 5.30 (H) 01/24/2016 0305   CREATININE 3.6 (HH) 01/08/2016 1348   CALCIUM 7.5 (L) 01/24/2016 0305   CALCIUM 8.6 01/08/2016 1348    GFRNONAA 7 (L) 01/24/2016 0305   GFRNONAA 21 (L) 10/25/2012 1158   GFRAA 8 (L) 01/24/2016 0305   GFRAA 24 (L) 10/25/2012 1158   CBC    Component Value Date/Time   WBC 6.1 01/24/2016 0828   RBC 3.50 (L) 01/24/2016 0828   HGB 8.0 (L) 01/24/2016 0828   HGB 8.8 (L) 01/08/2016 1348   HCT 26.4 (L) 01/24/2016 0828   HCT 29.1 (L) 01/08/2016 1348   PLT 104 (L) 01/24/2016 0828   PLT 184 01/08/2016 1348   PLT 166 01/31/2015 1117   MCV 75.4 (L) 01/24/2016 0828   MCV 78.0 (L) 01/08/2016 1348   MCH 22.9 (L) 01/24/2016 0828   MCHC 30.3 01/24/2016 0828   RDW 19.6 (H) 01/24/2016 0828   RDW 21.4 (H) 01/08/2016 1348   LYMPHSABS 0.5 (L) 01/17/2016 2003   LYMPHSABS 1.0 01/08/2016 1348   MONOABS 0.9 01/17/2016 2003   MONOABS 0.8 01/08/2016 1348   EOSABS 0.0 01/17/2016 2003   EOSABS 0.1 01/08/2016 1348   EOSABS 0.5 (H) 01/31/2015 1117   BASOSABS 0.0 01/17/2016 2003   BASOSABS 0.0 01/08/2016 1348     Assessment/Plan:   1. AKI on CKD3-4: Most likely exacerbated by hypoperfusion in the setting of refractory afib with RVR. She will need continued volume management with diuretics as she is not a good HD candidate due to poor functional status, refractory afib, moderate AS, and CHF of 35%. Plan seems to be going to rehab. Decreasing furosemide to BID would be an option if her volume status gets closer to normal. Her pedal edema is improved with negative balance recorded on TID dosing, but she is still overloaded at this time.  2. Metabolic acidosis- improving, and currently on sodium bicarb 1300 PO TID  3. Afib with RVR: HR still in the 100-120s on amiodarone and BB per primary, heart failure  4. Acute on chronic systolic HF with reduced EF:  currently being diuresed with IV lasix and metolazone, HF team following. Volume status has improved today and net output was about 2 L over past 24 hours.   5. MGUS: kappa/lamda ratio with elevations the same as seen in May. Pt established with heme/onc for  follow up  6. Anemia: Iron deficiency and chronic disease. Continued treatment is reasonable since improved anemia might relived some dyspnea and/or allow greater PT participation. She has been managed with heme/onc service on 300 Aranesp monthly, would consider change to 200 q2weeks given continued anemia but will defer to their management once outpatient.   7. Diabetes mellitus: per primary team, does have proteinuria but MGUS as above  8. History of CVA: On coumadin. Resuming eliquis may not be an option as her renal function may not recover further.  9. Goals of care: She remains in a fairly stable state with advanced chronic disease of multiple organ systems. Palliative service on board   We will arrange outpatient nephrology follow up in a few weeks. Please call for any additional questions or epeat evaluation for a change in status.   Collier Salina, MD PGY-II Internal Medicine Resident Pager# [phone] 01/24/2016, 11:59 AM  I have seen and examined this patient and agree with plan and assessment in the above note with renal recommendations/intervention highlighted. She has an appointment to follow up with her primary Nephrologist, Dr. Edrick Oh on 02/12/16 at 10:00am.  She also has appointment for Aranesp at [facility] short stay through [facility], however she was given an additional 277mcg sq today as she may require an escalation in dose (normally receives 356mcg SQ every month under the direction of Dr. Burr Medico.  Will sign off as we have nothing further to add.  Please call with questions or concerns.   [name] Frances Joynt,MD 01/24/2016 12:01 PM

## 2016-01-24 NOTE — Progress Notes (Addendum)
Advanced Heart Failure Rounding Note   Subjective:    No complaints. Breathing fine. HR remains 90-100 on tele . Cr 5.5 -> 5.3. Toprol increased to 50 daily.Weight down.   Echo reviewed EF 35%. Moderate AS   Objective:   Weight Range:  Vital Signs:   Temp:  [97.3 F (36.3 C)-98.3 F (36.8 C)] 97.6 F (36.4 C) (09/07 0348) Pulse Rate:  [86-109] 97 (09/07 0500) Resp:  [12-20] 12 (09/07 0500) BP: (96-127)/(58-88) 113/74 (09/07 0500) SpO2:  [92 %-100 %] 97 % (09/07 0500) Weight:  [92.9 kg (204 lb 12.9 oz)] 92.9 kg (204 lb 12.9 oz) (09/07 0500) Last BM Date: 01/22/16  Weight change: Filed Weights   01/22/16 0500 01/23/16 0503 01/24/16 0500  Weight: 96.6 kg (212 lb 15.4 oz) 97.4 kg (214 lb 11.7 oz) 92.9 kg (204 lb 12.9 oz)    Intake/Output:   Intake/Output Summary (Last 24 hours) at 01/24/16 0549 Last data filed at 01/24/16 0535  Gross per 24 hour  Intake             1209 ml  Output             2575 ml  Net            -1366 ml     Physical Exam: General:  In bed NAD HEENT: normal  Neck: supple. JVP 8-9 Cor: PMI nondisplaced. Irregular rate and rhythm, tachy. 2/6 SEM at RUSB  Lungs: Diminished  Abdomen: Obese, NT, mild distended but soft, no HSM. No bruits or masses. +BS  Extremities: no cyanosis, clubbing, rash. R and LLE 1-2+edema up into thighs.  Neuro: alert & orientedx3, cranial nerves grossly intact. moves all 4 extremities w/o difficulty. Affect pleasant  Telemetry:  AF 90-100  Labs: Basic Metabolic Panel:  Recent Labs Lab 01/20/16 0320 01/21/16 0329 01/22/16 0244 01/23/16 0304 01/23/16 1116 01/24/16 0305  NA 139 138 139 138  --  138  K 4.5 4.3 4.2 4.2  --  4.1  CL 112* 110 107 101  --  103  CO2 13* 17* 20* 18*  --  21*  GLUCOSE 134* 124* 155* 167*  --  150*  BUN 63* 63* 63* 69*  --  70*  CREATININE 5.39* 5.47* 5.34* 5.52*  --  5.30*  CALCIUM 7.7* 7.7* 7.8* 7.2*  --  7.5*  MG  --   --   --   --  1.6* 1.6*    Liver Function  Tests:  Recent Labs Lab 01/17/16 2003  AST 36  ALT 18  ALKPHOS 117  BILITOT 1.6*  PROT 7.2  ALBUMIN 3.5    Recent Labs Lab 01/17/16 2003  LIPASE 65*   No results for input(s): AMMONIA in the last 168 hours.  CBC:  Recent Labs Lab 01/17/16 2003 01/18/16 0455 01/22/16 0244  WBC 9.2 8.3 7.1  NEUTROABS 7.8*  --   --   HGB 9.2* 8.5* 8.2*  HCT 30.5* 29.0* 27.4*  MCV 78.0 79.2 76.5*  PLT 253 209 139*    Cardiac Enzymes: No results for input(s): CKTOTAL, CKMB, CKMBINDEX, TROPONINI in the last 168 hours.  BNP: BNP (last 3 results)  Recent Labs  01/17/16 2003  BNP 238.3*    ProBNP (last 3 results) No results for input(s): PROBNP in the last 8760 hours.    Other results:  Imaging: No results found.   Medications:     Scheduled Medications: . amiodarone  200 mg Oral BID  . atorvastatin  40 mg Oral Daily  . darbepoetin (ARANESP) injection - NON-DIALYSIS  40 mcg Subcutaneous Q14 Days  . ferumoxytol  510 mg Intravenous Weekly  . furosemide  160 mg Intravenous TID  . guaiFENesin  600 mg Oral BID  . heparin  5,000 Units Subcutaneous Q8H  . hydrocortisone  1 application Rectal BID  . levothyroxine  112 mcg Oral QAC breakfast  . mouth rinse  15 mL Mouth Rinse BID  . metolazone  5 mg Oral BID  . metoprolol succinate  50 mg Oral Daily  . pantoprazole  40 mg Oral Daily  . polyethylene glycol  17 g Oral Daily  . senna  1 tablet Oral QHS  . sodium bicarbonate  1,300 mg Oral TID  . Warfarin - Pharmacist Dosing Inpatient   Does not apply q1800    Infusions:    PRN Medications: acetaminophen **OR** acetaminophen, lidocaine, polyethylene glycol, senna-docusate, witch hazel-glycerin   Assessment:   1. Acute on chronic systolic CHF (LVEF 123456 09/2014) 2. 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 Chronic Afib - Acute RVR This patients CHA2DS2-VASc Score and unadjusted Ischemic Stroke Rate (% per year) is equal to 9.7 % stroke rate/year from a score of 6  Above score  calculated as 1 point each if present [CHF, HTN, DM, Vascular=MI/PAD/Aortic Plaque, Age if 65-74, or Female] Above score calculated as 2 points each if present [Age > 75, or Stroke/TIA/TE]   4. Hx of CVA 5. HTN 6. HLD 7. MGUS vs MM 8. Chronic anemia with scheduled transfusions/EPO.   9. T2DM 10. Hypotension  Plan/Discussion:     Volume status still elevated despite IV lasix and metolazone.. Creatinine stable . Renal adjusting diuretics. Per renal--> not a candidate for dialysis.    AF rate improved on po amio and Toprol 50 daily. Can titrate Toprol as needed. Not candidate for AV node ablation.  Unfortunately we have little else to add. We will sign off. Please call with questions.   Tita Terhaar,MD 5:49 AM Advanced Heart Failure Team Pager [phone] (M-F; 7a - 4p)  Please contact [facility] for night-coverage after hours (4p -7a ) and weekends on [email or web address]

## 2016-01-24 NOTE — Progress Notes (Signed)
ANTICOAGULATION CONSULT NOTE  Pharmacy Consult for Coumadin Indication: atrial fibrillation  No Known Allergies  Patient Measurements: Height: 5' 6"  (185.6 cm) Weight: 204 lb 12.9 oz (92.9 kg) IBW/kg (Calculated) : 59.3  Vital Signs: Temp: 98.3 F (36.8 C) (09/07 0805) Temp Source: Oral (09/07 0805) BP: 112/81 (09/07 0805) Pulse Rate: 97 (09/07 0805)  Labs:  Recent Labs  01/22/16 0244 01/23/16 0304 01/23/16 0706 01/24/16 0305 01/24/16 0828  HGB 8.2*  --   --   --  8.0*  HCT 27.4*  --   --   --  26.4*  PLT 139*  --   --   --  104*  LABPROT  --   --  18.2* 19.2*  --   INR  --   --  1.49 1.60  --   CREATININE 5.34* 5.52*  --  5.30*  --     Estimated Creatinine Clearance: 10 mL/min (by C-G formula based on SCr of 5.3 mg/dL).   Medical History: Past Medical History:  Diagnosis Date  . Anemia    b12 def, iron def, follow at cancer center, gets B12 and  another injection there. Could not remeber the name. Dr. Ralene Ok is her  cancer doctor  . Asthma   . Atrial fibrillation ([place])   . Blood transfusion    two or more yrs ago  . Chronic renal insufficiency   . Chronic venous insufficiency   . Diabetes mellitus   . Hyperlipidemia   . Hypertension   . Hypothyroidism    thyroid removed 4 or more yrs ago  . MGUS (monoclonal gammopathy of unknown significance)   . Osteomyelitis ([place])    s/p Rt 2nd toe and left 5 toes  amputation in 1/12  by Dr. Sharol Given  . Stroke ([place])   . Thyroid disease    hypothyroidism h/o hyperthyroidism s/p ablation/ectomy    Medications:  Prescriptions Prior to Admission  Medication Sig Dispense Refill Last Dose  . atorvastatin (LIPITOR) 40 MG tablet TAKE 1 TABLET BY MOUTH EVERY DAY 90 tablet 3 01/16/2016 at Unknown time  . calcitRIOL (ROCALTROL) 0.25 MCG capsule Take 1 capsule by mouth every Monday, Wednesday, and Friday.  5 01/16/2016 at Unknown time  . cetirizine (ZYRTEC) 10 MG chewable tablet Chew 1 tablet (10 mg total) by mouth daily.  (Patient taking differently: Chew 10 mg by mouth daily as needed for allergies. ) 30 tablet 0 01/16/2016 at Unknown time  . ELIQUIS 5 MG TABS tablet TAKE 1 TABLET(5 MG) BY MOUTH TWICE DAILY 60 tablet 2 01/16/2016 at Unknown time  . furosemide (LASIX) 20 MG tablet TAKE 1 TABLET(20 MG) BY MOUTH DAILY (Patient taking differently: TAKE 1 TABLET(20 MG) BY MOUTH TWICE DAILY.) 30 tablet 6 01/17/2016 at Unknown time  . glipiZIDE (GLUCOTROL) 10 MG tablet Take 0.5 tablets (5 mg total) by mouth 2 (two) times daily with a meal. 60 tablet 3 01/17/2016 at Unknown time  . JANUVIA 25 MG tablet TAKE 1 TABLET BY MOUTH EVERY DAY 90 tablet 2 01/17/2016 at Unknown time  . levothyroxine (SYNTHROID, LEVOTHROID) 112 MCG tablet TAKE 1 TABLET(112 MCG) BY MOUTH DAILY BEFORE BREAKFAST 30 tablet 6 01/17/2016 at Unknown time  . lisinopril (PRINIVIL,ZESTRIL) 40 MG tablet TAKE 1 TABLET BY MOUTH EVERY DAY 90 tablet 3 01/17/2016 at Unknown time  . metoprolol tartrate (LOPRESSOR) 25 MG tablet Take 1 tablet (25 mg total) by mouth 2 (two) times daily. 180 tablet 3 01/17/2016 at Unknown time  . pantoprazole (PROTONIX) 40 MG tablet  TAKE 1 TABLET BY MOUTH EVERY DAY 90 tablet 3 01/16/2016 at Unknown time  . polyethylene glycol (MIRALAX / GLYCOLAX) packet Take 17 g by mouth daily as needed for mild constipation or moderate constipation.    unknown  . sennosides-docusate sodium (SENOKOT-S) 8.6-50 MG tablet Take 2 tablets by mouth daily as needed for constipation.    unknown  . UNABLE TO FIND Inject 1 each into the skin every 28 (twenty-eight) days. Vitamin D injection monthly at the cancer center.   Past Month at Unknown time  . Blood Glucose Monitoring Suppl (ONETOUCH VERIO FLEX SYSTEM) W/DEVICE KIT 1 each by Does not apply route every morning. Check blood sugar one time each day 1 kit 1 Taking  . Cholecalciferol (VITAMIN D3) 10000 UNITS capsule Take 1 capsule (10,000 Units total) by mouth once a week. (Patient not taking: Reported on 01/17/2016) 4  capsule 3 Not Taking at Unknown time  . clotrimazole (LOTRIMIN) 1 % cream Apply 1 application topically 2 (two) times daily. (Patient not taking: Reported on 01/17/2016) 30 g 0 Not Taking at Unknown time  . glucose blood (ONETOUCH VERIO) test strip Check blood sugar one time each day 50 each 5 Taking  . ONETOUCH DELICA LANCETS FINE MISC Check blood sugar one time each day 100 each 5 Taking    Assessment: 81 y.o. female with Afib previously on Eliquis, now changing to warfarin for worsening CKD. Patient is currently on heparin SQ until INR is >2.     Patient is on amiodarone for rate control, which was initiated prior to warfarin therapy and can decrease warfarin needs. She is also fluid-overloaded due to CHF/renal dysfunction and is currently being diuresed.  Patient's INR is subtherapeutic again today at 1.60, with INR trending up after first dose yesterday. H/H and platelet count is low but stable (has anemia).  Goal of Therapy:  INR 2-3 Monitor platelets by anticoagulation protocol: Yes   Plan:  Warfarin 48m PO once tonight Continue heparin [place] until INR is therapeutic Daily INRs while inpatient Monitor CBC and signs/symptoms of bleeds   MDemetrius Charity PharmD Acute Care Pharmacy Resident  Pager: [phone]/11/2015

## 2016-01-24 NOTE — Progress Notes (Signed)
                                                                                                                                                                                                         Daily Progress Note   Patient Name: [name]       Date: 01/24/2016 DOB: 12/16/1934  Age: 81 y.o. MRN#: [id number] Attending Physician: Duncan Thomas Vincent, MD Primary Care Physician: MULLEN, EMILY, MD Admit Date: 01/17/2016  Reason for Consultation/Follow-up: Establishing goals of care  Subjective: Patient awake, alert, and oriented this morning. She is sitting up in bed eating breakfast. States her only pain is occasionally the rectal pain but using cream and tucks pads with relief. She was able to have a small bowel movement yesterday. She is going to try and work with PT today. She tells me that the plan is to go to rehab near her daughters home.   Length of Stay: 6  Current Medications: Scheduled Meds:  . amiodarone  200 mg Oral BID  . atorvastatin  40 mg Oral Daily  . darbepoetin (ARANESP) injection - NON-DIALYSIS  200 mcg Subcutaneous Q14 Days  . ferumoxytol  510 mg Intravenous Weekly  . furosemide  160 mg Intravenous TID  . guaiFENesin  600 mg Oral BID  . heparin  5,000 Units Subcutaneous Q8H  . hydrocortisone  1 application Rectal BID  . levothyroxine  112 mcg Oral QAC breakfast  . mouth rinse  15 mL Mouth Rinse BID  . metolazone  5 mg Oral BID  . metoprolol succinate  50 mg Oral Daily  . pantoprazole  40 mg Oral Daily  . polyethylene glycol  17 g Oral Daily  . senna  1 tablet Oral QHS  . sodium bicarbonate  1,300 mg Oral TID  . Warfarin - Pharmacist Dosing Inpatient   Does not apply q1800    Continuous Infusions:    PRN Meds: acetaminophen **OR** acetaminophen, lidocaine, polyethylene glycol, senna-docusate, witch hazel-glycerin  Physical Exam  Constitutional: She is oriented to person, place, and time. She appears well-developed. She is cooperative.    Cardiovascular: An irregular rhythm present.  afib-rate controlled  Pulmonary/Chest: No accessory muscle usage. No tachypnea. No respiratory distress. She has decreased breath sounds.  Abdominal: Soft. Bowel sounds are normal. There is no tenderness.  Neurological: She is alert and oriented to person, place, and time.  Skin: Skin is warm and dry.  Psychiatric: She has a normal mood and affect. Her speech is normal and behavior is normal. Thought content normal. Cognition and memory   are normal.  Nursing note and vitals reviewed.           Vital Signs: BP 112/81   Pulse 97   Temp 98.3 F (36.8 C) (Oral)   Resp 15   Ht 5' 6" (1.676 m)   Wt 92.9 kg (204 lb 12.9 oz)   SpO2 97%   BMI 33.06 kg/m  SpO2: SpO2: 97 % O2 Device: O2 Device: Not Delivered O2 Flow Rate: O2 Flow Rate (L/min): 2 L/min  Intake/output summary:  Intake/Output Summary (Last 24 hours) at 01/24/16 1000 Last data filed at 01/24/16 0900  Gross per 24 hour  Intake             1209 ml  Output             2575 ml  Net            -1366 ml   LBM: Last BM Date: 01/23/16 Baseline Weight: Weight: 94.9 kg (209 lb 3.2 oz) Most recent weight: Weight: 92.9 kg (204 lb 12.9 oz)       Palliative Assessment/Data: PPS 40%   Flowsheet Rows   Flowsheet Row Most Recent Value  Intake Tab  Referral Department  -- [internal medicine]  Unit at Time of Referral  Intermediate Care Unit  Palliative Care Primary Diagnosis  Nephrology  Date Notified  01/21/16  Palliative Care Type  New Palliative care  Reason for referral  Clarify Goals of Care  Date of Admission  01/17/16  Date first seen by Palliative Care  01/22/16  # of days Palliative referral response time  1 Day(s)  # of days IP prior to Palliative referral  4  Clinical Assessment  Psychosocial & Spiritual Assessment  Palliative Care Outcomes      Patient Active Problem List   Diagnosis Date Noted  . Palliative care encounter   . Goals of care,  counseling/discussion   . DNR (do not resuscitate)   . Cardiorenal syndrome   . Chronic atrial fibrillation (HCC)   . Acute systolic congestive heart failure (HCC)   . Renal failure (ARF), acute on chronic (HCC) 01/18/2016  . Acute on chronic renal failure (HCC) 01/18/2016  . Papular rash 09/19/2015  . Ulcer of left lower extremity (HCC) 09/19/2015  . Benign neoplasm of transverse colon 12/17/2014  . Gastritis   . Melena 12/12/2014  . UGI bleed 12/12/2014  . Absolute anemia   . CVA (cerebral vascular accident) (HCC) 10/01/2014  . TIA (transient ischemic attack) 09/30/2014  . Arthralgia of both hands 08/04/2014  . Preventative health care 05/03/2014  . Facet arthropathy of spine 04/26/2014  . Vitamin D deficiency 11/03/2012  . External hemorrhoids 06/28/2012  . Intermittent sleepiness 06/28/2012  . Anemia associated with chronic renal failure 04/03/2011  . MGUS (monoclonal gammopathy of unknown significance) 04/03/2011  . Atrial fibrillation (HCC) 01/24/2011  . History of osteomyelitis 09/05/2009  . Hypothyroidism 02/01/2007  . Diabetes mellitus with neuropathy (HCC) 07/03/2006  . CKD (chronic kidney disease) stage 4, GFR 15-29 ml/min (HCC) 07/03/2006  . Hyperlipidemia 03/02/2006  . Essential hypertension 03/02/2006  . VENOUS INSUFFICIENCY 03/02/2006  . EDEMA LEG 03/02/2006  . History of amputation of lesser toe (HCC) 03/02/2006    Palliative Care Assessment & Plan   Patient Profile: 80 y.o.femalewith past medical history of heart failure with EF 35-40%, atrial fib, stroke, type 2 DM, hypertension, hyperlipidemia, hypothyroidism, osteomyelitis s/p bilateral toe amputations, monoclonal gammopathy of unknown significance vs. Multiple myeloma, chronic kidney disease, and anemia admitted on   8/31/2017with progressive leg swelling, fatigue, rectal pain, and upper respiratory infection symptoms. Patient is in afib RVR exacerbating heart failure. She is on an amiodarone drip and  receiving lasix. Cardiology following. Acute on chronic renal disease and per nephrology, patient is not a suitable candidate for hemodialysis. Palliative Medicine consultation for goals of care, anticipatory guidance, and symptom management.   Assessment: Acute on chronic renal failure Afib CHF  Recommendations/Plan:  DNR/DNI per patient.  Encouraged patient to participate with PT/OT.  LBM 9/6-continue scheduled Senna and Miralax.  PMT will continue to shadow chart during hospitalization.  Disposition SNF with palliative services.   Goals of Care and Additional Recommendations:  Limitations on Scope of Treatment: Full Scope Treatment-Patient is a DNR  Code Status: DNR   Code Status Orders        Start     Ordered   01/22/16 1258  Do not attempt resuscitation (DNR)  Continuous    Question Answer Comment  In the event of cardiac or respiratory ARREST Do not call a "code blue"   In the event of cardiac or respiratory ARREST Do not perform Intubation, CPR, defibrillation or ACLS   In the event of cardiac or respiratory ARREST Use medication by any route, position, wound care, and other measures to relive pain and suffering. May use oxygen, suction and manual treatment of airway obstruction as needed for comfort.      01/22/16 1257    Code Status History    Date Active Date Inactive Code Status Order ID Comments User Context   01/18/2016  3:38 AM 01/22/2016 12:57 PM Full Code [id number]  Nathan Boswell, MD Inpatient   12/12/2014 11:22 PM 12/17/2014  5:50 PM Full Code [id number]  David Manuel Ortiz, MD Inpatient   09/30/2014  6:03 PM 10/03/2014  3:36 PM Full Code [id number]  Eden W Jones, MD Inpatient   04/30/2011  4:36 PM 05/07/2011  5:32 PM Full Code [id number]  Jacquelyn Lindsay Moss, RN Inpatient       Prognosis:   < 6 weeks-in the setting of end stage renal failure. Patient is not a dialysis candidate. Kidney functions continue to worsen this hospitalization.   Discharge  Planning:  Skilled Nursing Facility for rehab with Palliative care service follow-up  Care plan was discussed with Patient and Dr. Anwar  Thank you for allowing the Palliative Medicine Team to assist in the care of this patient.   Time In: 0905 Time Out: 0930 Total Time 25min Prolonged Time Billed  no       Greater than 50%  of this time was spent counseling and coordinating care related to the above assessment and plan.  Megan Mason, FNP-C Palliative Medicine Team  Phone: [phone] Fax: [phone]  Addendum: Patient seen and examined Agree with note above Zeba Anwar MD [place] palliative medicine team [phone]   Please contact Palliative Medicine Team phone at [phone] for questions and concerns.      

## 2016-01-25 DIAGNOSIS — D472 Monoclonal gammopathy: Secondary | ICD-10-CM | POA: Diagnosis not present

## 2016-01-25 DIAGNOSIS — I5023 Acute on chronic systolic (congestive) heart failure: Secondary | ICD-10-CM | POA: Diagnosis not present

## 2016-01-25 DIAGNOSIS — R278 Other lack of coordination: Secondary | ICD-10-CM | POA: Diagnosis not present

## 2016-01-25 DIAGNOSIS — I319 Disease of pericardium, unspecified: Secondary | ICD-10-CM | POA: Diagnosis not present

## 2016-01-25 DIAGNOSIS — R0602 Shortness of breath: Secondary | ICD-10-CM | POA: Diagnosis not present

## 2016-01-25 DIAGNOSIS — M6281 Muscle weakness (generalized): Secondary | ICD-10-CM | POA: Diagnosis not present

## 2016-01-25 DIAGNOSIS — R32 Unspecified urinary incontinence: Secondary | ICD-10-CM | POA: Diagnosis present

## 2016-01-25 DIAGNOSIS — R000 Tachycardia, unspecified: Secondary | ICD-10-CM | POA: Diagnosis not present

## 2016-01-25 DIAGNOSIS — I132 Hypertensive heart and chronic kidney disease with heart failure and with stage 5 chronic kidney disease, or end stage renal disease: Secondary | ICD-10-CM | POA: Diagnosis not present

## 2016-01-25 DIAGNOSIS — R402411 Glasgow coma scale score 13-15, in the field [EMT or ambulance]: Secondary | ICD-10-CM | POA: Diagnosis not present

## 2016-01-25 DIAGNOSIS — I959 Hypotension, unspecified: Secondary | ICD-10-CM | POA: Diagnosis not present

## 2016-01-25 DIAGNOSIS — I5022 Chronic systolic (congestive) heart failure: Secondary | ICD-10-CM | POA: Diagnosis not present

## 2016-01-25 DIAGNOSIS — I5042 Chronic combined systolic (congestive) and diastolic (congestive) heart failure: Secondary | ICD-10-CM | POA: Diagnosis not present

## 2016-01-25 DIAGNOSIS — Z5189 Encounter for other specified aftercare: Secondary | ICD-10-CM | POA: Diagnosis not present

## 2016-01-25 DIAGNOSIS — R0902 Hypoxemia: Secondary | ICD-10-CM | POA: Diagnosis not present

## 2016-01-25 DIAGNOSIS — E1122 Type 2 diabetes mellitus with diabetic chronic kidney disease: Secondary | ICD-10-CM | POA: Diagnosis not present

## 2016-01-25 DIAGNOSIS — R262 Difficulty in walking, not elsewhere classified: Secondary | ICD-10-CM | POA: Diagnosis not present

## 2016-01-25 DIAGNOSIS — E785 Hyperlipidemia, unspecified: Secondary | ICD-10-CM | POA: Diagnosis not present

## 2016-01-25 DIAGNOSIS — R404 Transient alteration of awareness: Secondary | ICD-10-CM | POA: Diagnosis not present

## 2016-01-25 DIAGNOSIS — I6789 Other cerebrovascular disease: Secondary | ICD-10-CM | POA: Diagnosis not present

## 2016-01-25 DIAGNOSIS — D631 Anemia in chronic kidney disease: Secondary | ICD-10-CM | POA: Diagnosis not present

## 2016-01-25 DIAGNOSIS — I313 Pericardial effusion (noninflammatory): Secondary | ICD-10-CM | POA: Diagnosis not present

## 2016-01-25 DIAGNOSIS — Z66 Do not resuscitate: Secondary | ICD-10-CM | POA: Diagnosis not present

## 2016-01-25 DIAGNOSIS — J90 Pleural effusion, not elsewhere classified: Secondary | ICD-10-CM | POA: Diagnosis not present

## 2016-01-25 DIAGNOSIS — A4151 Sepsis due to Escherichia coli [E. coli]: Secondary | ICD-10-CM | POA: Diagnosis not present

## 2016-01-25 DIAGNOSIS — Z515 Encounter for palliative care: Secondary | ICD-10-CM | POA: Diagnosis not present

## 2016-01-25 DIAGNOSIS — Z79899 Other long term (current) drug therapy: Secondary | ICD-10-CM | POA: Diagnosis not present

## 2016-01-25 DIAGNOSIS — I509 Heart failure, unspecified: Secondary | ICD-10-CM | POA: Diagnosis not present

## 2016-01-25 DIAGNOSIS — I130 Hypertensive heart and chronic kidney disease with heart failure and stage 1 through stage 4 chronic kidney disease, or unspecified chronic kidney disease: Secondary | ICD-10-CM | POA: Diagnosis not present

## 2016-01-25 DIAGNOSIS — L89151 Pressure ulcer of sacral region, stage 1: Secondary | ICD-10-CM | POA: Diagnosis not present

## 2016-01-25 DIAGNOSIS — Z7189 Other specified counseling: Secondary | ICD-10-CM | POA: Diagnosis not present

## 2016-01-25 DIAGNOSIS — K644 Residual hemorrhoidal skin tags: Secondary | ICD-10-CM | POA: Diagnosis not present

## 2016-01-25 DIAGNOSIS — R6521 Severe sepsis with septic shock: Secondary | ICD-10-CM | POA: Diagnosis not present

## 2016-01-25 DIAGNOSIS — D649 Anemia, unspecified: Secondary | ICD-10-CM | POA: Diagnosis not present

## 2016-01-25 DIAGNOSIS — D638 Anemia in other chronic diseases classified elsewhere: Secondary | ICD-10-CM | POA: Diagnosis not present

## 2016-01-25 DIAGNOSIS — R6520 Severe sepsis without septic shock: Secondary | ICD-10-CM | POA: Diagnosis not present

## 2016-01-25 DIAGNOSIS — I2510 Atherosclerotic heart disease of native coronary artery without angina pectoris: Secondary | ICD-10-CM | POA: Diagnosis not present

## 2016-01-25 DIAGNOSIS — I482 Chronic atrial fibrillation: Secondary | ICD-10-CM | POA: Diagnosis not present

## 2016-01-25 DIAGNOSIS — E876 Hypokalemia: Secondary | ICD-10-CM | POA: Diagnosis not present

## 2016-01-25 DIAGNOSIS — R531 Weakness: Secondary | ICD-10-CM | POA: Diagnosis not present

## 2016-01-25 DIAGNOSIS — E872 Acidosis: Secondary | ICD-10-CM | POA: Diagnosis not present

## 2016-01-25 DIAGNOSIS — N184 Chronic kidney disease, stage 4 (severe): Secondary | ICD-10-CM | POA: Diagnosis not present

## 2016-01-25 DIAGNOSIS — N179 Acute kidney failure, unspecified: Secondary | ICD-10-CM | POA: Diagnosis not present

## 2016-01-25 DIAGNOSIS — R791 Abnormal coagulation profile: Secondary | ICD-10-CM | POA: Diagnosis not present

## 2016-01-25 DIAGNOSIS — E039 Hypothyroidism, unspecified: Secondary | ICD-10-CM | POA: Diagnosis not present

## 2016-01-25 DIAGNOSIS — N189 Chronic kidney disease, unspecified: Secondary | ICD-10-CM | POA: Diagnosis not present

## 2016-01-25 DIAGNOSIS — A419 Sepsis, unspecified organism: Secondary | ICD-10-CM | POA: Diagnosis not present

## 2016-01-25 DIAGNOSIS — N390 Urinary tract infection, site not specified: Secondary | ICD-10-CM | POA: Diagnosis not present

## 2016-01-25 DIAGNOSIS — R112 Nausea with vomiting, unspecified: Secondary | ICD-10-CM | POA: Diagnosis not present

## 2016-01-25 DIAGNOSIS — J811 Chronic pulmonary edema: Secondary | ICD-10-CM | POA: Diagnosis not present

## 2016-01-25 DIAGNOSIS — B9620 Unspecified Escherichia coli [E. coli] as the cause of diseases classified elsewhere: Secondary | ICD-10-CM | POA: Diagnosis not present

## 2016-01-25 DIAGNOSIS — E860 Dehydration: Secondary | ICD-10-CM | POA: Diagnosis not present

## 2016-01-25 DIAGNOSIS — I248 Other forms of acute ischemic heart disease: Secondary | ICD-10-CM | POA: Diagnosis not present

## 2016-01-25 DIAGNOSIS — E46 Unspecified protein-calorie malnutrition: Secondary | ICD-10-CM | POA: Diagnosis not present

## 2016-01-25 DIAGNOSIS — N185 Chronic kidney disease, stage 5: Secondary | ICD-10-CM | POA: Diagnosis not present

## 2016-01-25 LAB — BASIC METABOLIC PANEL
Anion gap: 14 (ref 5–15)
BUN: 72 mg/dL — AB (ref 6–20)
CO2: 24 mmol/L (ref 22–32)
Calcium: 7.8 mg/dL — ABNORMAL LOW (ref 8.9–10.3)
Chloride: 99 mmol/L — ABNORMAL LOW (ref 101–111)
Creatinine, Ser: 5.35 mg/dL — ABNORMAL HIGH (ref 0.44–1.00)
GFR calc Af Amer: 8 mL/min — ABNORMAL LOW (ref >60)
GFR, EST NON AFRICAN AMERICAN: 7 mL/min — AB (ref >60)
GLUCOSE: 162 mg/dL — AB (ref 65–99)
POTASSIUM: 4.1 mmol/L (ref 3.5–5.1)
SODIUM: 137 mmol/L (ref 135–145)

## 2016-01-25 LAB — MAGNESIUM: MAGNESIUM: 1.6 mg/dL — AB (ref 1.7–2.4)

## 2016-01-25 LAB — PROTIME-INR
INR: 1.52
PROTHROMBIN TIME: 18.5 s — AB (ref 11.4–15.2)

## 2016-01-25 MED ORDER — ACETAMINOPHEN 325 MG PO TABS: 650.0000 mg | ORAL_TABLET | Freq: Four times a day (QID) | ORAL | 3 refills | Status: DC | PRN

## 2016-01-25 MED ORDER — LIDOCAINE 4 % EX CREA: TOPICAL_CREAM | Freq: Two times a day (BID) | CUTANEOUS | 0 refills | Status: DC | PRN

## 2016-01-25 MED ORDER — METOLAZONE 5 MG PO TABS: 5.0000 mg | ORAL_TABLET | Freq: Every day | ORAL | 2 refills | Status: DC

## 2016-01-25 MED ORDER — AMIODARONE HCL 200 MG PO TABS: 200.0000 mg | ORAL_TABLET | Freq: Two times a day (BID) | ORAL | 1 refills | Status: DC

## 2016-01-25 MED ORDER — HYDROCORTISONE 2.5 % RE CREA: 1.0000 "application " | TOPICAL_CREAM | Freq: Two times a day (BID) | RECTAL | 2 refills | Status: DC

## 2016-01-25 MED ORDER — FUROSEMIDE 80 MG PO TABS: 80.0000 mg | ORAL_TABLET | Freq: Two times a day (BID) | ORAL | 2 refills | Status: DC

## 2016-01-25 MED ORDER — SODIUM BICARBONATE 650 MG PO TABS: 1300.0000 mg | ORAL_TABLET | Freq: Three times a day (TID) | ORAL | 1 refills | Status: DC

## 2016-01-25 MED ORDER — WARFARIN SODIUM 6 MG PO TABS
6.0000 mg | ORAL_TABLET | Freq: Once | ORAL | Status: AC
  Administered 2016-01-25: 6 mg via ORAL
  Filled 2016-01-25 (×2): qty 1

## 2016-01-25 MED ORDER — METOPROLOL SUCCINATE ER 25 MG PO TB24: 75.0000 mg | ORAL_TABLET | Freq: Every day | ORAL | 1 refills | Status: DC

## 2016-01-25 MED ORDER — WARFARIN SODIUM 5 MG PO TABS
5.0000 mg | ORAL_TABLET | Freq: Every day | ORAL | 0 refills | Status: DC
Start: 2016-01-25 — End: 2016-02-19

## 2016-01-25 MED ORDER — WARFARIN SODIUM 6 MG PO TABS
6.0000 mg | ORAL_TABLET | Freq: Once | ORAL | Status: DC
  Filled 2016-01-25: qty 1

## 2016-01-25 MED ORDER — WITCH HAZEL-GLYCERIN EX PADS: MEDICATED_PAD | CUTANEOUS | 12 refills | Status: DC | PRN

## 2016-01-25 NOTE — Discharge Instructions (Addendum)
You were seen in the hospital with several problems causing you to build up fluid and feel tired.  First, your heart was going too fast, in a rhythm call atrial fibrillation.  This hurt your kidneys, which were already not working well to begin with.  The combination of your heart failure and kidney failure led to fluid build up.  While in the hospital, we have controlled your heart rate and gotten some of fluid off by giving you medicines to help you urinate.  However, your kidney function has continue to be bad, and dialysis is not an option for you with your heart conditions.  We have also started you on warfarin, a blood thinner, to reduce the risk of having another stroke in the future.  You well go to the [facility] rehab facility to try and get stronger.  Palliative Care doctors will continue to work with you to make you as comfortable as possible.   Information on my medicine - Coumadin   (Warfarin)  This medication education was reviewed with me or my healthcare representative as part of my discharge preparation.  The pharmacist that spoke with me during my hospital stay was:  Saundra Shelling, [facility]  Why was Coumadin prescribed for you? Coumadin was prescribed for you because you have a blood clot or a medical condition that can cause an increased risk of forming blood clots. Blood clots can cause serious health problems by blocking the flow of blood to the heart, lung, or brain. Coumadin can prevent harmful blood clots from forming. As a reminder your indication for Coumadin is:   Stroke Prevention Because Of Atrial Fibrillation  What test will check on my response to Coumadin? While on Coumadin (warfarin) you will need to have an INR test regularly to ensure that your dose is keeping you in the desired range. The INR (international normalized ratio) number is calculated from the result of the laboratory test called prothrombin time (PT).  If an INR APPOINTMENT HAS NOT ALREADY BEEN MADE  FOR YOU please schedule an appointment to have this lab work done by your health care provider within 7 days. Your INR goal is usually a number between:  2 to 3  What  do you need to  know  About  COUMADIN? Take Coumadin (warfarin) exactly as prescribed by your healthcare provider about the same time each day.  DO NOT stop taking without talking to the doctor who prescribed the medication.  Stopping without other blood clot prevention medication to take the place of Coumadin may increase your risk of developing a new clot or stroke.  Get refills before you run out.  What do you do if you miss a dose? If you miss a dose, take it as soon as you remember on the same day then continue your regularly scheduled regimen the next day.  Do not take two doses of Coumadin at the same time.  Important Safety Information A possible side effect of Coumadin (Warfarin) is an increased risk of bleeding. You should call your healthcare provider right away if you experience any of the following: ? Bleeding from an injury or your nose that does not stop. ? Unusual colored urine (red or dark brown) or unusual colored stools (red or black). ? Unusual bruising for unknown reasons. ? A serious fall or if you hit your head (even if there is no bleeding).  Some foods or medicines interact with Coumadin (warfarin) and might alter your response to warfarin. To  help avoid this: ? Eat a balanced diet, maintaining a consistent amount of Vitamin K. ? Notify your provider about major diet changes you plan to make. ? Avoid alcohol or limit your intake to 1 drink for women and 2 drinks for men per day. (1 drink is 5 oz. wine, 12 oz. beer, or 1.5 oz. liquor.)  Make sure that ANY health care provider who prescribes medication for you knows that you are taking Coumadin (warfarin).  Also make sure the healthcare provider who is monitoring your Coumadin knows when you have started a new medication including herbals and  non-prescription products.  Coumadin (Warfarin)  Major Drug Interactions  Increased Warfarin Effect Decreased Warfarin Effect  Alcohol (large quantities) Antibiotics (esp. Septra/Bactrim, Flagyl, Cipro) Amiodarone (Cordarone) Aspirin (ASA) Cimetidine (Tagamet) Megestrol (Megace) NSAIDs (ibuprofen, naproxen, etc.) Piroxicam (Feldene) Propafenone (Rythmol SR) Propranolol (Inderal) Isoniazid (INH) Posaconazole (Noxafil) Barbiturates (Phenobarbital) Carbamazepine (Tegretol) Chlordiazepoxide (Librium) Cholestyramine (Questran) Griseofulvin Oral Contraceptives Rifampin Sucralfate (Carafate) Vitamin K   Coumadin (Warfarin) Major Herbal Interactions  Increased Warfarin Effect Decreased Warfarin Effect  Garlic Ginseng Ginkgo biloba Coenzyme Q10 Green tea St. Johns wort    Coumadin (Warfarin) FOOD Interactions  Eat a consistent number of servings per week of foods HIGH in Vitamin K (1 serving =  cup)  Collards (cooked, or boiled & drained) Kale (cooked, or boiled & drained) Mustard greens (cooked, or boiled & drained) Parsley *serving size only =  cup Spinach (cooked, or boiled & drained) Swiss chard (cooked, or boiled & drained) Turnip greens (cooked, or boiled & drained)  Eat a consistent number of servings per week of foods MEDIUM-HIGH in Vitamin K (1 serving = 1 cup)  Asparagus (cooked, or boiled & drained) Broccoli (cooked, boiled & drained, or raw & chopped) Brussel sprouts (cooked, or boiled & drained) *serving size only =  cup Lettuce, raw (green leaf, endive, romaine) Spinach, raw Turnip greens, raw & chopped   These websites have more information on Coumadin (warfarin):  FailFactory.se; VeganReport.com.au;

## 2016-01-25 NOTE — Progress Notes (Signed)
Patient will discharge to [facility] Anticipated discharge date: 9/8 Family notified: pt son Transportation by [employer]- called at 2:40pm- per transport 2-3 hour wait due to high volume of calls  CSW signing off.  Jorge Ny, [place] Social Worker [phone]

## 2016-01-25 NOTE — Progress Notes (Signed)
Subjective: Feeling well, understands a place and [facility] has become available.  She has stayed at that facility before and is ready for discharge.  Objective:  Vital signs in last 24 hours: Vitals:   01/25/16 0351 01/25/16 0400 01/25/16 0700 01/25/16 0928  BP:  119/68 114/63 (!) 116/94  Pulse:  (!) 101 96 (!) 111  Resp:  12 16   Temp:  98 F (36.7 C)    TempSrc:  Oral    SpO2:  98% 97%   Weight: 202 lb 13.2 oz (92 kg)     Height:       Physical Exam  Constitutional: She is oriented to person, place, and time. No distress.  Cardiovascular:  Irregularly irregular rhythm, normal S1 and S2, 2/6 systolic murmur  Pulmonary/Chest:  No crackles or wheezes.  Musculoskeletal:  Mild biLE edema to thighs Neurological: She is alert and oriented to person, place, and time.  Skin: Skin is warm and dry.  Psychiatric: She has a normal mood and affect. Her behavior is normal.   Telemetry: atrial fibrillation, rates ~100 overnight  BMP Latest Ref Rng & Units 01/25/2016 01/24/2016 01/23/2016  Glucose 65 - 99 mg/dL 162(H) 150(H) 167(H)  BUN 6 - 20 mg/dL 72(H) 70(H) 69(H)  Creatinine 0.44 - 1.00 mg/dL 5.35(H) 5.30(H) 5.52(H)  BUN/Creat Ratio 11 - 26 - - -  Sodium 135 - 145 mmol/L 137 138 138  Potassium 3.5 - 5.1 mmol/L 4.1 4.1 4.2  Chloride 101 - 111 mmol/L 99(L) 103 101  CO2 22 - 32 mmol/L 24 21(L) 18(L)  Calcium 8.9 - 10.3 mg/dL 7.8(L) 7.5(L) 7.2(L)    Intake/Output Summary (Last 24 hours) at 01/25/16 1152 Last data filed at 01/25/16 0756  Gross per 24 hour  Intake              656 ml  Output             3250 ml  Net            -2594 ml   Filed Weights   01/23/16 0503 01/24/16 0500 01/25/16 0351  Weight: 214 lb 11.7 oz (97.4 kg) 204 lb 12.9 oz (92.9 kg) 202 lb 13.2 oz (92 kg)     Assessment/Plan:  Principal Problem:   Acute on chronic renal failure (HCC) Active Problems:   Hypothyroidism   Diabetes mellitus with neuropathy (HCC)   Atrial fibrillation (HCC)  Cardiorenal syndrome   Chronic atrial fibrillation (HCC)   Acute systolic congestive heart failure ([place])   Palliative care encounter   Goals of care, counseling/discussion   DNR (do not resuscitate)  Bed available at [facility].  Plan to discharge today with oral diuretic regimen and close follow-up with Dr. Daryll Drown.  #Acute on Chronic Renal Failure  #Non-Gap Metabolic Acidosis Multifactorial with worsening CKD likely due to DM and MM/MGUS, with acute hypoperfusion due to AF RVR.  Her serum free light chains remain up with elevated K:L ratio.  No nephrotic range proteinuria. Her acidosis is likely hyperchloremic metabolic acidosis from her renal failure.  Her renal function has not improved in parallel with her hemodynamics.  May have established a new baseline with further irreversible injury, but renal perfusion may still improve with better rate control.  Not a candidate for maintenance HD due to her comorbidities. -Daily BMP -PO bicarb 1300 TID  #Atrial Fibrillation with RVR Rates now controlled 90-100s, on PO metoprolol and amiodarone, without significant hypotension.  With her poor renal function, will  start warfarin rather than DOAC for stroke prophylaxis.  Will further uptitrate her metoprolol since her BPs are holding. -Continue PO amiodarone 200 mg twice daily -75 mg metoprolol succinate daily -Warfarin per pharmacy  #CHF #Volume Overload Weight down 10 lbs since yesterday with   BUN and Cr stable.  Baseline weight ~180 lbs on prior clinic visits.  Responded to escalation of diuresis with increased UOP. -160 mg IV furosemide TID -5 mg PO metolazone BID -Fluid restrict 1200 mL -Follow I/Os and weights  #Anemia Mildly microcytic, last ferritin low-normal, near her baseline.  Likely multifactorial, with anemia of chronic kidney disease and myeloproliferative disorder/MM as contributors, with possibility of overlaid iron deficiency and chronic disease.  Last iron studies with  low-normal ferritin but low iron and high TIBC.   Has been recieving EPO monthly (last 8/22).  Does not currently require transfusion.  #Hypocalcemia Asymptmatic, with no seizure, tetany, spasm, or long QT on telemetry.  Will replete to support cardiac contractility. -1 g CaGluc  #Hyperkalemia Resloved. -Daily BMP  #Disposition Understands plan to discharge to SNF and hopefully get her home for some amount of time.  DNR, understands severity of her medical conditions. -Appreciate Palliative Care's involvement -Awaiting SNF placement  #DM2 -SSI  #Hypothyroidism Slightly elevated TSH -Continue home levothyroxine  #Rectal Pain Symptomatic treatment for external hemorrhoids. -Hydrocortisone cream, lidocaine cream, Tucks  Dispo: Anticipated discharge in approximately 0 day(s).   Minus Liberty, MD 01/25/2016, 11:52 AM Pager: [phone]

## 2016-01-25 NOTE — Progress Notes (Signed)
Report given to York Grice LPN of [facility],PIV and foley was discontinued, waiting for transport, pt alert oriented VSS.

## 2016-01-25 NOTE — Consult Note (Signed)
   [facility] CM Inpatient Consult   01/25/2016  [name] [birth year] [id number]  Patient screened for potential [place] Management services. Patient is eligible for [facility] Care Management services under patient's [facility]  plan. Met with inpatient case manager and the patient will discharge with Hospice care at a skilled facility likely. No [facility] Care Management needs would be appropriate.  For questions contact:   Natividad Brood, RN BSN [facility] Liaison  [phone] business mobile phone Toll free office [phone]

## 2016-01-25 NOTE — Progress Notes (Signed)
ANTICOAGULATION CONSULT NOTE  Pharmacy Consult:  Coumadin Indication: atrial fibrillation  No Known Allergies  Patient Measurements: Height: 5\' 6"  (167.6 cm) Weight: 202 lb 13.2 oz (92 kg) IBW/kg (Calculated) : 59.3  Vital Signs: Temp: 98 F (36.7 C) (09/08 0400) Temp Source: Oral (09/08 0400) BP: 116/94 (09/08 0928) Pulse Rate: 111 (09/08 0928)  Labs:  Recent Labs  01/23/16 0304 01/23/16 0706 01/24/16 0305 01/24/16 0828 01/25/16 0222  HGB  --   --   --  8.0*  --   HCT  --   --   --  26.4*  --   PLT  --   --   --  104*  --   LABPROT  --  18.2* 19.2*  --  18.5*  INR  --  1.49 1.60  --  1.52  CREATININE 5.52*  --  5.30*  --  5.35*    Estimated Creatinine Clearance: 9.9 mL/min (by C-G formula based on SCr of 5.35 mg/dL).   Assessment: 11 YOM with history of Afib on Eliquis PTA.  She has been switched to Coumadin with heparin SQ bridge due to reduced renal function.  INR remains sub-therapeutic; no bleeding reported.   Goal of Therapy:  INR 2-3    Plan:  - Coumadin 6mg  PO today - Continue heparin SQ until INR > 2 - Daily PT / INR   Aviona Martenson D. Mina Marble, PharmD, BCPS Pager:  [phone] 01/25/2016, 10:07 AM

## 2016-01-25 NOTE — Care Management Important Message (Signed)
Important Message  Patient Details  Name: [name] MRN: [id number] Date of Birth: 01/08/1935   Medicare Important Message Given:  Yes    Arina Torry [name] 01/25/2016, 11:02 AM

## 2016-01-28 ENCOUNTER — Non-Acute Institutional Stay (SKILLED_NURSING_FACILITY): Payer: Medicare Other | Admitting: Internal Medicine

## 2016-01-28 DIAGNOSIS — D696 Thrombocytopenia, unspecified: Secondary | ICD-10-CM

## 2016-01-28 DIAGNOSIS — E1122 Type 2 diabetes mellitus with diabetic chronic kidney disease: Secondary | ICD-10-CM

## 2016-01-28 DIAGNOSIS — E039 Hypothyroidism, unspecified: Secondary | ICD-10-CM

## 2016-01-28 DIAGNOSIS — D638 Anemia in other chronic diseases classified elsewhere: Secondary | ICD-10-CM

## 2016-01-28 DIAGNOSIS — I5021 Acute systolic (congestive) heart failure: Secondary | ICD-10-CM

## 2016-01-28 DIAGNOSIS — I482 Chronic atrial fibrillation, unspecified: Secondary | ICD-10-CM

## 2016-01-28 DIAGNOSIS — I739 Peripheral vascular disease, unspecified: Secondary | ICD-10-CM

## 2016-01-28 DIAGNOSIS — K5900 Constipation, unspecified: Secondary | ICD-10-CM

## 2016-01-28 DIAGNOSIS — K644 Residual hemorrhoidal skin tags: Secondary | ICD-10-CM

## 2016-01-28 DIAGNOSIS — K219 Gastro-esophageal reflux disease without esophagitis: Secondary | ICD-10-CM

## 2016-01-28 DIAGNOSIS — Z7189 Other specified counseling: Secondary | ICD-10-CM | POA: Diagnosis not present

## 2016-01-28 DIAGNOSIS — K648 Other hemorrhoids: Secondary | ICD-10-CM

## 2016-01-28 DIAGNOSIS — N185 Chronic kidney disease, stage 5: Secondary | ICD-10-CM | POA: Diagnosis not present

## 2016-01-28 DIAGNOSIS — R531 Weakness: Secondary | ICD-10-CM | POA: Diagnosis not present

## 2016-01-28 DIAGNOSIS — E785 Hyperlipidemia, unspecified: Secondary | ICD-10-CM

## 2016-01-28 NOTE — Progress Notes (Signed)
LOCATION: [name]  PCP: [name], MD   Code Status: DNR  Goals of care: Advanced Directive information Advanced Directives 01/28/2016  Does patient have an advance directive? Yes  Type of Advance Directive Out of facility DNR (pink MOST or yellow form)  Does patient want to make changes to advanced directive? No - Patient declined  Copy of advanced directive(s) in chart? Yes  Would patient like information on creating an advanced directive? -  Pre-existing out of facility DNR order (yellow form or pink MOST form) -       Extended Emergency Contact Information Primary Emergency Contact: Aniceto Boss, [place] of [place] Phone: [phone] Work Phone: [phone] Relation: Daughter Secondary Emergency Contact: [place] of [employer] Phone: [phone] Relation: Son   No Known Allergies  Chief Complaint  Patient presents with  . New Admit To SNF    New Admission     HPI:  Patient is a 82 y.o. female seen today for short term rehabilitation post hospital admission from 01/17/16-01/25/16 with afib with RVR with CHF exacerbation and acute on chronic renal failure. She received iv amiodarone and then her b blocker was resumed. She is now on warfarin for anticoagulation. She received iv lasix, metolazone and diuresed well. Renal team was consulted with worsening renal function and she is not a dialysis candidate. She was started on oral bicarbonate for metabolic acidosis. She received iv iron and epogen for her anemia of chronic disease. Palliative care team was consulted and she is now DNR and would like to focus on symptom management. She is seen in her room today with her daughter at bedside.    Review of Systems:  Constitutional: Negative for fever, chills, diaphoresis. Feels weak and tired. HENT: Negative for headache, congestion,sore throat, difficulty swallowing. Positive for nasal discharge.    Eyes: Negative for blurred vision, double vision and discharge.  Respiratory: Positive  for cough with clear phlegm, some shortness of breath and occasional wheezing.   Cardiovascular: Negative for chest pain, palpitations, leg swelling.  Gastrointestinal: Negative for heartburn, nausea, vomiting, abdominal pain. Last bowel movement was last night. Genitourinary: Negative for dysuria and flank pain.  Musculoskeletal: Negative for back pain, fall in the facility. she used a walker at home Skin: Negative for itching, rash.  Neurological: Negative for dizziness. Psychiatric/Behavioral: Negative for depression   Past Medical History:  Diagnosis Date  . Anemia    b12 def, iron def, follow at cancer center, gets B12 and  another injection there. Could not remeber the name. Dr. Ralene Ok is her  cancer doctor  . Asthma   . Atrial fibrillation ([employer])   . Blood transfusion    two or more yrs ago  . Chronic renal insufficiency   . Chronic venous insufficiency   . Diabetes mellitus   . Hyperlipidemia   . Hypertension   . Hypothyroidism    thyroid removed 4 or more yrs ago  . MGUS (monoclonal gammopathy of unknown significance)   . Osteomyelitis ([place])    s/p Rt 2nd toe and left 5 toes  amputation in 1/12  by Dr. Sharol Given  . Stroke ([place])   . Thyroid disease    hypothyroidism h/o hyperthyroidism s/p ablation/ectomy   Past Surgical History:  Procedure Laterality Date  . AMPUTATION  04/30/2011   Procedure: AMPUTATION FOOT;  Surgeon: Newt Minion, MD;  Location: [facility];  Service: Orthopedics;  Laterality: Right;  Right Midfoot Amputation  . APPENDECTOMY     teenager  . COLONOSCOPY N/A 12/17/2014   Procedure: COLONOSCOPY;  Surgeon: Ladene Artist, MD;  Location: [facility] ENDOSCOPY;  Service: Endoscopy;  Laterality: N/A;  . ESOPHAGOGASTRODUODENOSCOPY N/A 12/15/2014   Procedure: ESOPHAGOGASTRODUODENOSCOPY (EGD);  Surgeon: Ladene Artist, MD;  Location: [facility] ENDOSCOPY;  Service: Endoscopy;  Laterality: N/A;   . EYE SURGERY     cat ext ou   Social History:   reports that she has never smoked. She has never used smokeless tobacco. She reports that she does not drink alcohol or use drugs.  Family History  Problem Relation Age of Onset  . Anemia Mother   . Colon cancer Mother     rectal  . HIV Brother   . Cancer Brother     Medications:   Medication List       Accurate as of 01/28/16  3:05 PM. Always use your most recent med list.          acetaminophen 325 MG tablet Commonly known as:  TYLENOL Take 2 tablets (650 mg total) by mouth every 6 (six) hours as needed for mild pain (or Fever >/= 101).   amiodarone 200 MG tablet Commonly known as:  PACERONE Take 1 tablet (200 mg total) by mouth 2 (two) times daily.   atorvastatin 40 MG tablet Commonly known as:  LIPITOR TAKE 1 TABLET BY MOUTH EVERY DAY   calcitRIOL 0.25 MCG capsule Commonly known as:  ROCALTROL Take 1 capsule by mouth every Monday, Wednesday, and Friday.   cetirizine 10 MG chewable tablet Commonly known as:  ZYRTEC Chew 1 tablet (10 mg total) by mouth daily.   clotrimazole 1 % cream Commonly known as:  LOTRIMIN Apply 1 application topically 2 (two) times daily.   furosemide 80 MG tablet Commonly known as:  LASIX Take 1 tablet (80 mg total) by mouth 2 (two) times daily.   glipiZIDE 10 MG tablet Commonly known as:  GLUCOTROL Take 0.5 tablets (5 mg total) by mouth 2 (two) times daily with a meal.   glucose blood test strip Commonly known as:  ONETOUCH VERIO Check blood sugar one time each day   hydrocortisone 2.5 % rectal cream Commonly known as:  ANUSOL-HC Place 1 application rectally 2 (two) times daily.   JANUVIA 25 MG tablet Generic drug:  sitaGLIPtin TAKE 1 TABLET BY MOUTH EVERY DAY   levothyroxine 112 MCG tablet Commonly known as:  SYNTHROID, LEVOTHROID TAKE 1 TABLET(112 MCG) BY MOUTH DAILY BEFORE BREAKFAST   lidocaine 4 % cream Commonly known as:  LMX Apply topically 2 (two) times daily  as needed (rectal pain).   metolazone 5 MG tablet Commonly known as:  ZAROXOLYN Take 1 tablet (5 mg total) by mouth daily. 30 minutes before AM lasix   metoprolol succinate 25 MG 24 hr tablet Commonly known as:  TOPROL-XL Take 3 tablets (75 mg total) by mouth daily.   ONETOUCH DELICA LANCETS FINE Misc Check blood sugar one time each day   ONETOUCH VERIO FLEX SYSTEM w/Device Kit 1 each by Does not apply route every morning. Check blood sugar one time each day   pantoprazole 40 MG tablet Commonly known as:  PROTONIX TAKE 1 TABLET BY MOUTH EVERY DAY   polyethylene glycol packet Commonly known as:  MIRALAX / GLYCOLAX Take 17 g by mouth daily as needed for mild constipation or moderate constipation.   sennosides-docusate sodium 8.6-50 MG tablet Commonly known as:  SENOKOT-S Take 2 tablets by  mouth daily as needed for constipation.   sodium bicarbonate 650 MG tablet Take 2 tablets (1,300 mg total) by mouth 3 (three) times daily.   UNABLE TO FIND Inject 1 each into the skin every 28 (twenty-eight) days. Vitamin D injection monthly at the cancer center.   Vitamin D3 10000 units capsule Take 1 capsule (10,000 Units total) by mouth once a week.   warfarin 5 MG tablet Commonly known as:  COUMADIN Take 1 tablet (5 mg total) by mouth daily.   witch hazel-glycerin pad Commonly known as:  TUCKS Apply topically as needed for itching.       Immunizations: Immunization History  Administered Date(s) Administered  . PPD Test 01/25/2016  . Pneumococcal Polysaccharide-23 04/15/2005     Physical Exam: BP 124/63   Pulse 67   Temp 98.5 F (36.9 C)   Resp 18   Wt 193 lb 6.4 oz (87.7 kg)   SpO2 96%   BMI 31.22 kg/m   General- elderly female, frail, obese, in no acute distress Head- normocephalic, atraumatic Nose- no nasal discharge Throat- moist mucus membrane Eyes- PERRLA, EOMI, no pallor, no icterus, no discharge, normal conjunctiva, normal sclera Neck- no cervical  lymphadenopathy Cardiovascular- irregular heart rate, + systolic murmur, trace leg edema Respiratory- bilateral clear to auscultation, no wheeze, no rhonchi, no crackles, no use of accessory muscles Abdomen- bowel sounds present, soft, non tender Musculoskeletal- able to move all 4 extremities, generalized weakness more to lower extremities, bilateral transmetatarsal amputation Neurological- alert and oriented to person, place and time Skin- warm and dry, chronic skin changes to lower extremities, bruises to her arms Psychiatry- normal mood and affect    Labs reviewed: Basic Metabolic Panel:  Recent Labs  01/23/16 0304 01/23/16 1116 01/24/16 0305 01/25/16 0222  NA 138  --  138 137  K 4.2  --  4.1 4.1  CL 101  --  103 99*  CO2 18*  --  21* 24  GLUCOSE 167*  --  150* 162*  BUN 69*  --  70* 72*  CREATININE 5.52*  --  5.30* 5.35*  CALCIUM 7.2*  --  7.5* 7.8*  MG  --  1.6* 1.6* 1.6*   Liver Function Tests:  Recent Labs  10/11/15 1058 01/08/16 1348 01/17/16 2003  AST 12 16 36  ALT <9 <9 18  ALKPHOS 79 119 117  BILITOT 0.44 0.88 1.6*  PROT 7.8 7.6  7.2 7.2  ALBUMIN 3.3* 3.2* 3.5    Recent Labs  01/17/16 2003  LIPASE 65*   No results for input(s): AMMONIA in the last 8760 hours. CBC:  Recent Labs  12/07/15 1426 01/08/16 1348  01/17/16 2003 01/18/16 0455 01/22/16 0244 01/24/16 0828  WBC 6.4 6.0  < > 9.2 8.3 7.1 6.1  NEUTROABS 4.5 4.2  --  7.8*  --   --   --   HGB 8.0* 8.8*  < > 9.2* 8.5* 8.2* 8.0*  HCT 25.9* 29.1*  < > 30.5* 29.0* 27.4* 26.4*  MCV 80.9 78.0*  < > 78.0 79.2 76.5* 75.4*  PLT 127* 184  < > 253 209 139* 104*  < > = values in this interval not displayed. Cardiac Enzymes: No results for input(s): CKTOTAL, CKMB, CKMBINDEX, TROPONINI in the last 8760 hours. BNP: Invalid input(s): POCBNP CBG:  Recent Labs  01/21/16 1625 01/21/16 2114 01/22/16 0840  GLUCAP 182* 154* 121*    Radiological Exams: Ct Abdomen Pelvis Wo Contrast  Result  Date: 01/17/2016 CLINICAL DATA:  Rectal pain  for 1 day, worse with sitting. EXAM: CT ABDOMEN AND PELVIS WITHOUT CONTRAST TECHNIQUE: Multidetector CT imaging of the abdomen and pelvis was performed following the standard protocol without IV contrast. COMPARISON:  Ultrasound 02/01/2013 FINDINGS: There is hyperdense layering material within the gallbladder lumen, possibly milk of calcium. Discrete calculi are not visible. No bile duct dilatation. Unremarkable unenhanced appearances of the liver. There are unremarkable unenhanced appearances of the pancreas, spleen and adrenals. Kidneys appear mildly atrophic but are otherwise unremarkable. Collecting systems and ureters are unremarkable. Urinary bladder exhibits mild uniform mural thickening which could represent cystitis although it may also be related to the incomplete urinary bladder distention. There are unremarkable appearances of the stomach, small bowel and colon except for mild uncomplicated diverticulosis. The normal caliber aorta is heavily calcified. There are multiple calcified uterine fibroids measuring up to about 2.7 cm. No adnexal mass is evident. No focal acute inflammatory changes are evident in the abdomen or pelvis. There is a small volume ascites collected in the left adnexal region. There is a fat containing umbilical hernia In the lower chest, there is a moderate left pleural effusion which is incompletely imaged. There are posterior lung base opacities which are incompletely imaged but more likely represent benign scarring or atelectasis. No significant skeletal lesion is evident. IMPRESSION: 1. No acute inflammatory changes are evident in the abdomen or pelvis. No bowel obstruction or perforation. No perianal or perirectal abscess to account for the rectal pain. 2. Multiple calcified uterine fibroids measuring up to about 2.7 cm. 3. Hyperdense layering material within the gallbladder lumen, possibly milk of calcium bile. 4. Fat containing  umbilical hernia. 5. Moderate left pleural effusion, incompletely imaged. Electronically Signed   By: Andreas Newport M.D.   On: 01/17/2016 21:25   Dg Abdomen Acute W/chest  Result Date: 01/17/2016 CLINICAL DATA:  Rectal pain for 1 day. EXAM: DG ABDOMEN ACUTE W/ 1V CHEST COMPARISON:  December 12, 2014 FINDINGS: There is no evidence of dilated bowel loops or free intraperitoneal air. No radiopaque calculi or other significant radiographic abnormality is seen. Degenerative joint changes of the spine and scoliosis of the spine are identified. Images of the lungs demonstrate patchy consolidation of left lung base. There bilateral pleural effusions. There is increased central pulmonary vessel caliber. The heart size is enlarged. IMPRESSION: No bowel obstruction or free air. Bilateral pleural effusions. Patchy consolidation of left lung base, underlying pneumonia is not excluded. Central pulmonary vascular congestion with cardiomegaly. Electronically Signed   By: Abelardo Diesel M.D.   On: 01/17/2016 18:54    Assessment/Plan  Generalized weakness Will have her work with physical therapy and occupational therapy team to help with gait training and muscle strengthening exercises.fall precautions. Skin care. Encourage to be out of bed. Get palliative care consult  Goals of care discussion Reviewed palliative care note from hospital. Patient is DNR and goal is for comfort care and therapy. Get palliative care consult  afib Rate controlled. continue metoprolol succinate 75 mg daily with amiodarone. Continue coumadin and monitor inr with goal inr 2-3. Continue atorvastatin  Acute systolic CHF Stable, monitor weight 3 days a week for now. Continue metoprolol succinate, lasix 80 mg bid and metolazone 5 mg daily. Check bmp and BP. Breathing currently stable.   ckd stage 5 Continue sodium bicarbonate 1300 mg tid to help with metabolic acidosis. Continue calcitriol. Monitor bmp  Anemia of chronic disease S/p iv  iron and epogen. Monitor cbc  Thrombocytopenia No bleed reported, check platelet count  Hypomagnesemia  Check bmp  PVD With chronic skin changes, monitor for skin breakdown and ulcer. Continue skin care  Constipation Continue senokot s 2 tab qd prn with miralax as needed  Hypothyroidism Lab Results  Component Value Date   TSH 5.954 (H) 01/19/2016   Continue levothyroxine 112 mcg daily, check tsh in 3 weeks  HLD Continue statin  DM type 2 with renal impairment Lab Results  Component Value Date   HGBA1C 7.3 07/11/2015   Check a1c. Continue januvia for now with glipizide and monitor cbg  gerd Stable, continue protonix  External hemorrhoid Continue anusol rectal cream    Goals of care: short term rehabilitation   Labs/tests ordered:  Family/ staff Communication: reviewed care plan with patient, her daughter and nursing supervisor    Blanchie Serve, MD Internal Medicine [address], [place] [address] Cell Phone (Monday-Friday 8 am - 5 pm): [phone] On Call: [phone] and follow prompts after 5 pm and on weekends Office Phone: [phone] Office Fax: [phone]

## 2016-01-29 ENCOUNTER — Telehealth: Payer: Self-pay | Admitting: Internal Medicine

## 2016-01-29 LAB — HEPATIC FUNCTION PANEL
ALK PHOS: 100 U/L (ref 25–125)
ALT: 14 U/L (ref 7–35)
AST: 18 U/L (ref 13–35)
BILIRUBIN, TOTAL: 0.7 mg/dL

## 2016-01-29 LAB — BASIC METABOLIC PANEL
BUN: 87 mg/dL — AB (ref 4–21)
Creatinine: 5.9 mg/dL — AB (ref 0.5–1.1)
Potassium: 3.7 mmol/L (ref 3.4–5.3)
SODIUM: 140 mmol/L (ref 137–147)

## 2016-01-29 LAB — CBC AND DIFFERENTIAL
HEMATOCRIT: 29 % — AB (ref 36–46)
HEMOGLOBIN: 8.9 g/dL — AB (ref 12.0–16.0)
Hemoglobin: 8.9 g/dL — AB (ref 12.0–16.0)
PLATELETS: 135 10*3/uL — AB (ref 150–399)
Platelets: 135 10*3/uL — AB (ref 150–399)
WBC: 6 10^3/mL

## 2016-01-29 NOTE — Telephone Encounter (Signed)
APT. REMINDER CALL, NO ANSWER NO VOICEMAIL °

## 2016-01-30 ENCOUNTER — Ambulatory Visit: Payer: Self-pay | Admitting: Internal Medicine

## 2016-01-30 ENCOUNTER — Non-Acute Institutional Stay (SKILLED_NURSING_FACILITY): Payer: Medicare Other | Admitting: Family

## 2016-01-30 DIAGNOSIS — N184 Chronic kidney disease, stage 4 (severe): Secondary | ICD-10-CM | POA: Diagnosis not present

## 2016-01-30 DIAGNOSIS — E46 Unspecified protein-calorie malnutrition: Secondary | ICD-10-CM

## 2016-01-30 DIAGNOSIS — D631 Anemia in chronic kidney disease: Secondary | ICD-10-CM | POA: Diagnosis not present

## 2016-01-30 DIAGNOSIS — N189 Chronic kidney disease, unspecified: Secondary | ICD-10-CM

## 2016-01-30 DIAGNOSIS — I482 Chronic atrial fibrillation, unspecified: Secondary | ICD-10-CM

## 2016-01-30 LAB — POCT INR: INR: 2.8 — AB (ref <=1.1)

## 2016-01-30 NOTE — Progress Notes (Signed)
Location:    [place] Room Number: 624  Place of Service:  SNF (31) Provider: Majestic Brister FNP-C   [name], MD  Patient Care Team: [name], MD as PCP - General (Internal Medicine) Monna Fam, MD as Consulting Physician (Ophthalmology) Edrick Oh, MD as Consulting Physician (Nephrology)  Extended Emergency Contact Information Primary Emergency Contact: Soyla Murphy of [place] Phone: [phone] Work Phone: [phone] Relation: Daughter Secondary Emergency Contact: Gassett,Christopher  [place] Mobile Phone: [phone] Relation: Son  Code Status:  Full Code  Goals of care: Advanced Directive information Advanced Directives 01/28/2016  Does patient have an advance directive? Yes  Type of Advance Directive Out of facility DNR (pink MOST or yellow form)  Does patient want to make changes to advanced directive? No - Patient declined  Copy of advanced directive(s) in chart? Yes  Would patient like information on creating an advanced directive? -  Pre-existing out of facility DNR order (yellow form or pink MOST form) -     Chief Complaint  Patient presents with  . Acute Visit    abnormal labs results     HPI:  Pt is a 81 y.o. female seen today at [facility] for an acute visit for evaluation of abnormal labs. She has a significant medical history of HTN, CKD, AFib, Type 2 DM among other conditions. She is seen in her room today. She denies any acute issues this visit.Her recent lab results showed Hgb 8.9, Hgb A1C 7.7, Plts 135,CR 5.88, Ca 7.7, Alb 3.0, Mg 1.6 (9/12 /2017). INR 2.8 previous 3.2 ( 01/29/2016).    Past Medical History:  Diagnosis Date  . Anemia    b12 def, iron def, follow at cancer center, gets B12 and  another injection there. Could not remeber the name. Dr. Ralene Ok is her  cancer doctor  . Asthma   . Atrial fibrillation  ([place])   . Blood transfusion    two or more yrs ago  . Chronic renal insufficiency   . Chronic venous insufficiency   . Diabetes mellitus   . Hyperlipidemia   . Hypertension   . Hypothyroidism    thyroid removed 4 or more yrs ago  . MGUS (monoclonal gammopathy of unknown significance)   . Osteomyelitis ([place])    s/p Rt 2nd toe and left 5 toes  amputation in 1/12  by Dr. Sharol Given  . Stroke ([place])   . Thyroid disease    hypothyroidism h/o hyperthyroidism s/p ablation/ectomy   Past Surgical History:  Procedure Laterality Date  . AMPUTATION  04/30/2011   Procedure: AMPUTATION FOOT;  Surgeon: Newt Minion, MD;  Location: [place];  Service: Orthopedics;  Laterality: Right;  Right Midfoot Amputation  . APPENDECTOMY     teenager  . COLONOSCOPY N/A 12/17/2014   Procedure: COLONOSCOPY;  Surgeon: Ladene Artist, MD;  Location: [facility] ENDOSCOPY;  Service: Endoscopy;  Laterality: N/A;  . ESOPHAGOGASTRODUODENOSCOPY N/A 12/15/2014   Procedure: ESOPHAGOGASTRODUODENOSCOPY (EGD);  Surgeon: Ladene Artist, MD;  Location: [facility] ENDOSCOPY;  Service: Endoscopy;  Laterality: N/A;  . EYE SURGERY     cat ext ou    No Known Allergies    Medication List       Accurate as of 01/30/16  3:08 PM. Always use your most recent med list.          acetaminophen 325 MG tablet  Commonly known as:  TYLENOL Take 2 tablets (650 mg total) by mouth every 6 (six) hours as needed for mild pain (or Fever >/= 101).   amiodarone 200 MG tablet Commonly known as:  PACERONE Take 1 tablet (200 mg total) by mouth 2 (two) times daily.   atorvastatin 40 MG tablet Commonly known as:  LIPITOR TAKE 1 TABLET BY MOUTH EVERY DAY   calcitRIOL 0.25 MCG capsule Commonly known as:  ROCALTROL Take 1 capsule by mouth every Monday, Wednesday, and Friday.   cetirizine 10 MG chewable tablet Commonly known as:  ZYRTEC Chew 1 tablet (10 mg total) by mouth daily.   clotrimazole 1 % cream Commonly known as:  LOTRIMIN Apply 1 application  topically 2 (two) times daily.   furosemide 80 MG tablet Commonly known as:  LASIX Take 1 tablet (80 mg total) by mouth 2 (two) times daily.   glipiZIDE 10 MG tablet Commonly known as:  GLUCOTROL Take 0.5 tablets (5 mg total) by mouth 2 (two) times daily with a meal.   glucose blood test strip Commonly known as:  ONETOUCH VERIO Check blood sugar one time each day   hydrocortisone 2.5 % rectal cream Commonly known as:  ANUSOL-HC Place 1 application rectally 2 (two) times daily.   JANUVIA 25 MG tablet Generic drug:  sitaGLIPtin TAKE 1 TABLET BY MOUTH EVERY DAY   levothyroxine 112 MCG tablet Commonly known as:  SYNTHROID, LEVOTHROID TAKE 1 TABLET(112 MCG) BY MOUTH DAILY BEFORE BREAKFAST   lidocaine 4 % cream Commonly known as:  LMX Apply topically 2 (two) times daily as needed (rectal pain).   metolazone 5 MG tablet Commonly known as:  ZAROXOLYN Take 1 tablet (5 mg total) by mouth daily. 30 minutes before AM lasix   metoprolol succinate 25 MG 24 hr tablet Commonly known as:  TOPROL-XL Take 3 tablets (75 mg total) by mouth daily.   ONETOUCH DELICA LANCETS FINE Misc Check blood sugar one time each day   ONETOUCH VERIO FLEX SYSTEM w/Device Kit 1 each by Does not apply route every morning. Check blood sugar one time each day   pantoprazole 40 MG tablet Commonly known as:  PROTONIX TAKE 1 TABLET BY MOUTH EVERY DAY   polyethylene glycol packet Commonly known as:  MIRALAX / GLYCOLAX Take 17 g by mouth daily as needed for mild constipation or moderate constipation.   sennosides-docusate sodium 8.6-50 MG tablet Commonly known as:  SENOKOT-S Take 2 tablets by mouth daily as needed for constipation.   sodium bicarbonate 650 MG tablet Take 2 tablets (1,300 mg total) by mouth 3 (three) times daily.   UNABLE TO FIND Inject 1 each into the skin every 28 (twenty-eight) days. Vitamin D injection monthly at the cancer center.   Vitamin D3 10000 units capsule Take 1 capsule  (10,000 Units total) by mouth once a week.   warfarin 5 MG tablet Commonly known as:  COUMADIN Take 1 tablet (5 mg total) by mouth daily.   witch hazel-glycerin pad Commonly known as:  TUCKS Apply topically as needed for itching.       Review of Systems  Constitutional: Negative for activity change, fatigue and fever.  HENT: Negative for congestion, rhinorrhea, sinus pressure, sneezing and sore throat.   Eyes: Negative.   Respiratory: Negative for cough, chest tightness, shortness of breath and wheezing.   Cardiovascular: Negative for chest pain, palpitations and leg swelling.  Gastrointestinal: Negative for abdominal distention, abdominal pain, constipation, diarrhea, nausea and vomiting.  Genitourinary: Negative for  dysuria, frequency and urgency.  Musculoskeletal: Positive for gait problem.  Skin: Negative for color change, pallor and rash.  Psychiatric/Behavioral: Negative for agitation, confusion, hallucinations and sleep disturbance. The patient is not nervous/anxious.     Immunization History  Administered Date(s) Administered  . PPD Test 01/25/2016  . Pneumococcal Polysaccharide-23 04/15/2005   Pertinent  Health Maintenance Due  Topic Date Due  . DEXA SCAN  04/18/2002  . PNA vac Low Risk Adult (2 of 2 - PCV13) 04/15/2006  . OPHTHALMOLOGY EXAM  09/01/2008  . LIPID PANEL  10/01/2015  . HEMOGLOBIN A1C  10/08/2015  . FOOT EXAM  11/22/2015  . INFLUENZA VACCINE  12/18/2015   Fall Risk  09/19/2015 07/11/2015 07/05/2015 01/31/2015 11/22/2014  Falls in the past year? _0   Number falls in past yr: - - - - -  Injury with Fall? - - - - -  Risk for fall due to : - - - - -  Risk for fall due to (comments): - - - - -      Vitals:   01/30/16 1000  BP: 112/64  Pulse: 80  Resp: 18  Temp: 97.7 F (36.5 C)  SpO2: 98%  Weight: 189 lb (85.7 kg)  Height: _1  (1.676 m)   Body mass index is 30.51 kg/m. Physical Exam  Constitutional: She is oriented to person,  place, and time. She appears well-developed and well-nourished. No distress.  HENT:  Head: Normocephalic.  Mouth/Throat: No oropharyngeal exudate.  Eyes: Conjunctivae and EOM are normal. Pupils are equal, round, and reactive to light. Right eye exhibits no discharge. Left eye exhibits no discharge. No scleral icterus.  Neck: Normal range of motion. No JVD present. No thyromegaly present.  Cardiovascular: Exam reveals no gallop and no friction rub.   No murmur heard. HR irregular   Pulmonary/Chest: Effort normal and breath sounds normal. No respiratory distress. She has no wheezes. She has no rales.  Abdominal: Soft. Bowel sounds are normal. She exhibits no distension. There is no tenderness. There is no rebound and no guarding.  Musculoskeletal: Normal range of motion. She exhibits no edema, tenderness or deformity.  Lymphadenopathy:    She has no cervical adenopathy.  Neurological: She is oriented to person, place, and time.  Skin: Skin is warm and dry. No rash noted. No erythema. No pallor.  Psychiatric: She has a normal mood and affect.    Labs reviewed:  Recent Labs  01/23/16 0304 01/23/16 1116 01/24/16 0305 01/25/16 0222  NA 138  --  138 137  K 4.2  --  4.1 4.1  CL 101  --  103 99*  CO2 18*  --  21* 24  GLUCOSE 167*  --  150* 162*  BUN 69*  --  70* 72*  CREATININE 5.52*  --  5.30* 5.35*  CALCIUM 7.2*  --  7.5* 7.8*  MG  --  1.6* 1.6* 1.6*    Recent Labs  10/11/15 1058 01/08/16 1348 01/17/16 2003  AST 12 16 36  ALT <9 <9 18  ALKPHOS 79 119 117  BILITOT 0.44 0.88 1.6*  PROT 7.8 7.6  7.2 7.2  ALBUMIN 3.3* 3.2* 3.5    Recent Labs  12/07/15 1426 01/08/16 1348  01/17/16 2003 01/18/16 0455 01/22/16 0244 01/24/16 0828  WBC 6.4 6.0  < > 9.2 8.3 7.1 6.1  NEUTROABS 4.5 4.2  --  7.8*  --   --   --   HGB 8.0* 8.8*  < > 9.2*  8.5* 8.2* 8.0*  HCT 25.9* 29.1*  < > 30.5* 29.0* 27.4* 26.4*  MCV 80.9 78.0*  < > 78.0 79.2 76.5* 75.4*  PLT 127* 184  < > 253 209 139*  104*  < > = values in this interval not displayed. Lab Results  Component Value Date   TSH 5.954 (H) 01/19/2016   Lab Results  Component Value Date   HGBA1C 7.3 07/11/2015   Lab Results  Component Value Date   CHOL 180 10/01/2014   HDL 81 10/01/2014   LDLCALC 91 10/01/2014   TRIG 41 10/01/2014   CHOLHDL 2.2 10/01/2014      Assessment/Plan 1. Low serum magnesium level Mg level 1.6 ( 01/29/2016). Start Magnesium oxide 400 mg Tablet daily. Repeat Mg level in one week.   2. Anemia in chronic kidney disease Hgb 8.9 ( 01/29/2016). Has improved previous 8.0 ( 01/24/2016). Continue to monitor CBC.   3. Protein-calorie malnutrition (HCC) ALB 3.0 ( 01/29/2016). RD consults.   4. CKD (chronic kidney disease) stage 5, GFR 15-29 ml/min (HCC) CR 5.88 ( 01/29/2016).Renal team was consulted on recent Hospital admission due to with worsening renal function and she is not a dialysis candidate.Continue on oral bicarbonate for metabolic acidosis.Continue on Palliative care services.    5. Chronic atrial fibrillation (HCC) HR irregular. Continue on Warfarin. INR this visit 2.8 previous 3.2 ( 01/29/2016). Recheck INR 01/31/2016.      Family/ staff Communication: Reviewed plan of care with patient and facility Nurse supervisor.   Labs/tests ordered:  Mg level 02/06/2016

## 2016-02-01 ENCOUNTER — Other Ambulatory Visit: Payer: Self-pay

## 2016-02-01 ENCOUNTER — Ambulatory Visit: Payer: Self-pay | Admitting: Hematology

## 2016-02-01 ENCOUNTER — Ambulatory Visit: Payer: Self-pay

## 2016-02-01 DIAGNOSIS — R531 Weakness: Secondary | ICD-10-CM | POA: Diagnosis not present

## 2016-02-01 NOTE — Progress Notes (Deleted)
[place] OFFICE PROGRESS NOTE  [name], MD [place] [address]  DIAGNOSIS: Anemia, unspecified anemia type  CHIEF COMPLAIN: Follow up MGUS and anemia.  CURRENT THERAPY: Vitamin B12 shots every 8 weeks and aranesp 200 mcg every 4 weeks for a hemoglobin less than or equal to 11, started on 01/09/2013. Held after 07/2014 due to her stroke,  and restarted on 03/07/2015   PROBLEM LIST:  1. Chronic anemia.  2. Renal insufficiency.  3. Atrial fibrillation.  4. 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  5. Low vitamin B12 level.  6. Osteomyelitis of the left foot status post left mid foot amputation  on 05/30/2010.  7. Status post amputation of toes of right foot in December 2012.  8. Hypertension.  9. Diabetes mellitus.  10.Venous insufficiency.  11.Dyslipidemia.  12.Status post total thyroidectomy for multinodular goiter on  03/13/2006.  13.History of asthma.  14.Thrombocytopenia with variable platelet count first noted 08/2010,  suspect chronic idiopathic thrombocytopenic purpura.  15. 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  INTERVAL HISTORY: [name] 81 y.o. female with a history of chronic kidney disease complicated by anemia and [place] B12 deficiency is here for follow-up.     she presents to the clinic by herself. She came in with a walker. She feels about the same. She has mild to moderate fatigue, able to live independently at home. Her hemoglobin was 6.4 two months ago and she received a blood transfusion. She missed her Aranesp injection last month. She denies any significant dyspnea or chest pain or other new symptoms.  MEDICAL HISTORY: Past Medical History:  Diagnosis Date  . Anemia    b12 def, iron def, follow at cancer center, gets B12 and  another injection there. Could not remeber the name. Dr. Ralene Ok is her  cancer doctor  . Asthma   .  Atrial fibrillation ([place])   . Blood transfusion    two or more yrs ago  . Chronic renal insufficiency   . Chronic venous insufficiency   . Diabetes mellitus   . Hyperlipidemia   . Hypertension   . Hypothyroidism    thyroid removed 4 or more yrs ago  . MGUS (monoclonal gammopathy of unknown significance)   . Osteomyelitis ([place])    s/p Rt 2nd toe and left 5 toes  amputation in 1/12  by Dr. Sharol Given  . Stroke ([place])   . Thyroid disease    hypothyroidism h/o hyperthyroidism s/p ablation/ectomy     ALLERGIES:  has No Known Allergies.  MEDICATIONS: has a current medication list which includes the following prescription(s): acetaminophen, amiodarone, atorvastatin, onetouch verio flex system, calcitriol, cetirizine, vitamin d3, clotrimazole, furosemide, glipizide, glucose blood, hydrocortisone, januvia, levothyroxine, lidocaine, metolazone, metoprolol succinate, onetouch delica lancets fine, pantoprazole, polyethylene glycol, sennosides-docusate sodium, sodium bicarbonate, UNABLE TO FIND, warfarin, and witch hazel-glycerin.  SURGICAL HISTORY:  Past Surgical History:  Procedure Laterality Date  . AMPUTATION  04/30/2011   Procedure: AMPUTATION FOOT;  Surgeon: Newt Minion, MD;  Location: [place];  Service: Orthopedics;  Laterality: Right;  Right Midfoot Amputation  . APPENDECTOMY     teenager  . COLONOSCOPY N/A 12/17/2014   Procedure: COLONOSCOPY;  Surgeon: Ladene Artist, MD;  Location: [facility] ENDOSCOPY;  Service: Endoscopy;  Laterality: N/A;  . ESOPHAGOGASTRODUODENOSCOPY N/A 12/15/2014   Procedure: ESOPHAGOGASTRODUODENOSCOPY (EGD);  Surgeon: Ladene Artist, MD;  Location: [facility] ENDOSCOPY;  Service: Endoscopy;  Laterality: N/A;  . EYE SURGERY     cat ext ou    REVIEW OF SYSTEMS:   Constitutional: Denies fevers, chills or abnormal weight loss Eyes: Denies blurriness of vision Ears, nose, mouth, throat, and face: Denies mucositis or sore throat Respiratory: Denies cough, dyspnea or  wheezes Cardiovascular: Denies palpitation, chest discomfort or lower extremity swelling Gastrointestinal:  Denies nausea, heartburn or change in bowel habits Skin: Denies abnormal skin rashes Lymphatics: Denies new lymphadenopathy or easy bruising Neurological:Denies numbness, tingling or new weaknesses Behavioral/Psych: Mood is stable, no new changes  All other systems were reviewed with the patient and are negative.  PHYSICAL EXAMINATION: ECOG PERFORMANCE STATUS: 3  There were no vitals taken for this visit.  GENERAL:alert, no distress and comfortable; chronically ill elderly female who uses a walker.  SKIN: skin color, texture, turgor are normal, no rashes or significant lesions, except a 2cm shallow skin ulcer in left LE above ankle, with surrounding skin erythema, no significant discharge  EYES: normal, Conjunctiva are pink and non-injected, sclera clear OROPHARYNX:no exudate, no erythema and lips, buccal mucosa, and tongue normal  NECK: supple, thyroid normal size, non-tender, without nodularity LYMPH:  no palpable lymphadenopathy in the cervical, axillary or supraclavicular LUNGS: clear to auscultation and percussion with normal breathing effort HEART: irregular rate & rhythm and no murmurs and 1+ lower extremity edema ABDOMEN:abdomen soft, non-tender and normal bowel sounds Musculoskeletal:no cyanosis of digits and no clubbing; s/p bilateral toes amputations as noted above.  NEURO: alert & oriented x 3 with fluent speech, ambulates via a wheelchair, she has mild left arm weakness, other cranial nerve exam were unremarkable, no focal motor or sensation deficiency.  LABORATORY DATA: CBC Latest Ref Rng & Units 01/29/2016 01/24/2016 01/22/2016  WBC 4.0 - 10.5 K/uL - 6.1 7.1  Hemoglobin 12.0 - 16.0 g/dL 8.9(A) 8.0(L) 8.2(L)  Hematocrit 36.0 - 46.0 % - 26.4(L) 27.4(L)  Platelets 150 - 399 K/L 135(A) 104(L) 139(L)    CMP Latest Ref Rng & Units 01/25/2016 01/24/2016 01/23/2016  Glucose 65 -  99 mg/dL 162(H) 150(H) 167(H)  BUN 6 - 20 mg/dL 72(H) 70(H) 69(H)  Creatinine 0.44 - 1.00 mg/dL 5.35(H) 5.30(H) 5.52(H)  Sodium 135 - 145 mmol/L 137 138 138  Potassium 3.5 - 5.1 mmol/L 4.1 4.1 4.2  Chloride 101 - 111 mmol/L 99(L) 103 101  CO2 22 - 32 mmol/L 24 21(L) 18(L)  Calcium 8.9 - 10.3 mg/dL 7.8(L) 7.5(L) 7.2(L)  Total Protein 6.5 - 8.1 g/dL - - -  Total Bilirubin 0.3 - 1.2 mg/dL - - -  Alkaline Phos 38 - 126 U/L - - -  AST 15 - 41 U/L - - -  ALT 14 - 54 U/L - - -   M-protein  10/11/2014: 0.3 02/28/2015: 0.2 05/02/2015: 0.3 07/05/2015: not det 10/11/2015: not det   IgA (69-380 mg/dl) 10/11/2014: 622 02/28/2015: 566 05/02/2015: 637 10/11/2015: 588  RADIOGRAPHIC STUDIES: 1. Digital screening mammogram on 07/26/2010 was negative.  2. MRI of the right forefoot without IV contrast on 08/20/2010 showed  apparent new soft tissue ulceration distal to the third metatarsal status post prior resection of the third toe. No soft tissue abscess was demonstrated. There was interval resolution of the previously demonstrated marrow edema within the third metatarsal. No evidence for osteomyelitis. There was stable mid foot degenerative changes and chronic myopathic changes.  3. CT of the cervical spine without IV contrast on 10/21/2010 showed  degenerative changes in the cervical spine. No displaced fractures  were identified.  4. CT of the head without IV contrast on 10/21/2010 showed no evidence  for acute intracranial hemorrhage, mass lesion or acute infarct. There was some mild diffuse atrophy. There was a subcutaneous hematoma over the left anterior frontal/periorbital region. No underlying skull fracture or acute intracranial abnormality.  5. Chest x-ray, 2 view, from 04/28/2011 showed stable moderate  cardiomegaly with no active lung disease. There was probable mild  scarring at the lung bases.   ASSESSMENT: [name] 81 y.o. female with a history of multiple comorbidities,  including anemia, IgA MGUS, renal failure, B12 deficiency, etc.  1. Anemia of chronic disease and MGUS, rule out MM  --her anemia has been getting slightly worse over the past year, despite Arenasp injection every month. She required a blood transfusion 2 months ago. -Given her history of IgA MGUS, renal failure, and worsening anemia, multiple myeloma needs to be ruled out. Her previous M protein level was low, IgA and light chain levels are elevated. -Her Skeletal survey was normal.  -I have discussed bone marrow biopsy several times and reviewed again today, and she repeatedly declined due to the concern of complications from procedure such as pain.  -her M-protein has been remain to be low and stable overall, will continue monitoring  -Repeated on study and the B12 was normal in [place] 2017 -Increase Aranesp from 200 to 358mg injection monthly, she missed a dose last month and anemia is worse today. Hb 8.0 I encouraged her to keep her monthly injection appointments  2. Right front lobe CVA in 09/2014 -likely related to her AF without anticoagulation .  -She is recovering slowly.  -She is on eliquis now   4. Hypertension, diabetes, A. Fib, CKD, and other medical problems -She will continue follow-up with her primary care physician. She is on Eliquis now for A. Fib  All questions were answered. The patient knows to call the clinic with any problems, questions or concerns. We can certainly see the patient much sooner if necessary.  Plan -Continue Aranesp injection monthly, increase dose to 3082m  -I'll see her back in 3 months   I spent 20 minutes counseling th between-year-old I think that she ise patient face to face. The total time spent in the appointment was 25 minutes.    FeTruitt MerleMD 02/01/2016

## 2016-02-08 ENCOUNTER — Inpatient Hospital Stay (HOSPITAL_COMMUNITY)
Admission: EM | Admit: 2016-02-08 | Discharge: 2016-02-19 | DRG: 871 | Disposition: A | Discharge status: Home Health | Payer: Medicare Other | Attending: Internal Medicine | Admitting: Internal Medicine

## 2016-02-08 ENCOUNTER — Encounter (HOSPITAL_COMMUNITY): Payer: Self-pay | Admitting: Emergency Medicine

## 2016-02-08 ENCOUNTER — Non-Acute Institutional Stay (SKILLED_NURSING_FACILITY): Payer: Medicare Other | Admitting: Internal Medicine

## 2016-02-08 ENCOUNTER — Encounter: Payer: Self-pay | Admitting: Internal Medicine

## 2016-02-08 ENCOUNTER — Emergency Department (HOSPITAL_COMMUNITY): Payer: Medicare Other

## 2016-02-08 DIAGNOSIS — I5022 Chronic systolic (congestive) heart failure: Secondary | ICD-10-CM | POA: Diagnosis not present

## 2016-02-08 DIAGNOSIS — R0600 Dyspnea, unspecified: Secondary | ICD-10-CM

## 2016-02-08 DIAGNOSIS — K828 Other specified diseases of gallbladder: Secondary | ICD-10-CM | POA: Diagnosis not present

## 2016-02-08 DIAGNOSIS — N390 Urinary tract infection, site not specified: Secondary | ICD-10-CM | POA: Diagnosis present

## 2016-02-08 DIAGNOSIS — I132 Hypertensive heart and chronic kidney disease with heart failure and with stage 5 chronic kidney disease, or end stage renal disease: Secondary | ICD-10-CM | POA: Diagnosis present

## 2016-02-08 DIAGNOSIS — E1122 Type 2 diabetes mellitus with diabetic chronic kidney disease: Secondary | ICD-10-CM | POA: Diagnosis present

## 2016-02-08 DIAGNOSIS — Z66 Do not resuscitate: Secondary | ICD-10-CM | POA: Diagnosis not present

## 2016-02-08 DIAGNOSIS — I5023 Acute on chronic systolic (congestive) heart failure: Secondary | ICD-10-CM | POA: Diagnosis present

## 2016-02-08 DIAGNOSIS — R32 Unspecified urinary incontinence: Secondary | ICD-10-CM | POA: Diagnosis present

## 2016-02-08 DIAGNOSIS — N179 Acute kidney failure, unspecified: Secondary | ICD-10-CM | POA: Diagnosis not present

## 2016-02-08 DIAGNOSIS — I319 Disease of pericardium, unspecified: Secondary | ICD-10-CM | POA: Diagnosis not present

## 2016-02-08 DIAGNOSIS — R630 Anorexia: Secondary | ICD-10-CM | POA: Diagnosis present

## 2016-02-08 DIAGNOSIS — R404 Transient alteration of awareness: Secondary | ICD-10-CM | POA: Diagnosis not present

## 2016-02-08 DIAGNOSIS — A419 Sepsis, unspecified organism: Secondary | ICD-10-CM | POA: Diagnosis not present

## 2016-02-08 DIAGNOSIS — R791 Abnormal coagulation profile: Secondary | ICD-10-CM | POA: Diagnosis present

## 2016-02-08 DIAGNOSIS — R0602 Shortness of breath: Secondary | ICD-10-CM | POA: Diagnosis not present

## 2016-02-08 DIAGNOSIS — K644 Residual hemorrhoidal skin tags: Secondary | ICD-10-CM | POA: Diagnosis present

## 2016-02-08 DIAGNOSIS — Z515 Encounter for palliative care: Secondary | ICD-10-CM | POA: Diagnosis not present

## 2016-02-08 DIAGNOSIS — E785 Hyperlipidemia, unspecified: Secondary | ICD-10-CM | POA: Diagnosis not present

## 2016-02-08 DIAGNOSIS — R6520 Severe sepsis without septic shock: Secondary | ICD-10-CM | POA: Diagnosis not present

## 2016-02-08 DIAGNOSIS — I639 Cerebral infarction, unspecified: Secondary | ICD-10-CM | POA: Diagnosis not present

## 2016-02-08 DIAGNOSIS — I959 Hypotension, unspecified: Secondary | ICD-10-CM | POA: Diagnosis not present

## 2016-02-08 DIAGNOSIS — R0902 Hypoxemia: Secondary | ICD-10-CM

## 2016-02-08 DIAGNOSIS — I509 Heart failure, unspecified: Secondary | ICD-10-CM | POA: Diagnosis not present

## 2016-02-08 DIAGNOSIS — E876 Hypokalemia: Secondary | ICD-10-CM | POA: Diagnosis present

## 2016-02-08 DIAGNOSIS — J811 Chronic pulmonary edema: Secondary | ICD-10-CM | POA: Diagnosis not present

## 2016-02-08 DIAGNOSIS — I248 Other forms of acute ischemic heart disease: Secondary | ICD-10-CM | POA: Diagnosis present

## 2016-02-08 DIAGNOSIS — E860 Dehydration: Secondary | ICD-10-CM

## 2016-02-08 DIAGNOSIS — E039 Hypothyroidism, unspecified: Secondary | ICD-10-CM | POA: Diagnosis present

## 2016-02-08 DIAGNOSIS — A4151 Sepsis due to Escherichia coli [E. coli]: Secondary | ICD-10-CM | POA: Diagnosis not present

## 2016-02-08 DIAGNOSIS — I5042 Chronic combined systolic (congestive) and diastolic (congestive) heart failure: Secondary | ICD-10-CM | POA: Diagnosis present

## 2016-02-08 DIAGNOSIS — D631 Anemia in chronic kidney disease: Secondary | ICD-10-CM | POA: Diagnosis present

## 2016-02-08 DIAGNOSIS — Z7984 Long term (current) use of oral hypoglycemic drugs: Secondary | ICD-10-CM

## 2016-02-08 DIAGNOSIS — R410 Disorientation, unspecified: Secondary | ICD-10-CM | POA: Diagnosis present

## 2016-02-08 DIAGNOSIS — B9620 Unspecified Escherichia coli [E. coli] as the cause of diseases classified elsewhere: Secondary | ICD-10-CM | POA: Diagnosis not present

## 2016-02-08 DIAGNOSIS — J90 Pleural effusion, not elsewhere classified: Secondary | ICD-10-CM | POA: Diagnosis not present

## 2016-02-08 DIAGNOSIS — M129 Arthropathy, unspecified: Secondary | ICD-10-CM | POA: Diagnosis not present

## 2016-02-08 DIAGNOSIS — I482 Chronic atrial fibrillation: Secondary | ICD-10-CM | POA: Diagnosis present

## 2016-02-08 DIAGNOSIS — Z8673 Personal history of transient ischemic attack (TIA), and cerebral infarction without residual deficits: Secondary | ICD-10-CM

## 2016-02-08 DIAGNOSIS — R112 Nausea with vomiting, unspecified: Secondary | ICD-10-CM | POA: Diagnosis not present

## 2016-02-08 DIAGNOSIS — N185 Chronic kidney disease, stage 5: Secondary | ICD-10-CM | POA: Diagnosis not present

## 2016-02-08 DIAGNOSIS — I5082 Biventricular heart failure: Secondary | ICD-10-CM

## 2016-02-08 DIAGNOSIS — J45909 Unspecified asthma, uncomplicated: Secondary | ICD-10-CM | POA: Diagnosis present

## 2016-02-08 DIAGNOSIS — R000 Tachycardia, unspecified: Secondary | ICD-10-CM

## 2016-02-08 DIAGNOSIS — R21 Rash and other nonspecific skin eruption: Secondary | ICD-10-CM

## 2016-02-08 DIAGNOSIS — R6521 Severe sepsis with septic shock: Secondary | ICD-10-CM | POA: Diagnosis present

## 2016-02-08 DIAGNOSIS — I4891 Unspecified atrial fibrillation: Secondary | ICD-10-CM | POA: Diagnosis present

## 2016-02-08 DIAGNOSIS — D472 Monoclonal gammopathy: Secondary | ICD-10-CM | POA: Diagnosis not present

## 2016-02-08 DIAGNOSIS — Z89431 Acquired absence of right foot: Secondary | ICD-10-CM

## 2016-02-08 DIAGNOSIS — Z79899 Other long term (current) drug therapy: Secondary | ICD-10-CM

## 2016-02-08 DIAGNOSIS — N189 Chronic kidney disease, unspecified: Secondary | ICD-10-CM | POA: Diagnosis present

## 2016-02-08 DIAGNOSIS — I2510 Atherosclerotic heart disease of native coronary artery without angina pectoris: Secondary | ICD-10-CM | POA: Diagnosis not present

## 2016-02-08 DIAGNOSIS — I313 Pericardial effusion (noninflammatory): Secondary | ICD-10-CM | POA: Diagnosis not present

## 2016-02-08 DIAGNOSIS — Z7901 Long term (current) use of anticoagulants: Secondary | ICD-10-CM

## 2016-02-08 DIAGNOSIS — T45515A Adverse effect of anticoagulants, initial encounter: Secondary | ICD-10-CM | POA: Diagnosis present

## 2016-02-08 DIAGNOSIS — L89151 Pressure ulcer of sacral region, stage 1: Secondary | ICD-10-CM | POA: Diagnosis present

## 2016-02-08 DIAGNOSIS — R402411 Glasgow coma scale score 13-15, in the field [EMT or ambulance]: Secondary | ICD-10-CM | POA: Diagnosis not present

## 2016-02-08 LAB — CBC WITH DIFFERENTIAL/PLATELET
Band Neutrophils: 0 %
Basophils Absolute: 0 10*3/uL (ref 0.0–0.1)
Basophils Relative: 0 %
Blasts: 0 %
Eosinophils Absolute: 0 10*3/uL (ref 0.0–0.7)
Eosinophils Relative: 0 %
HCT: 32.8 % — ABNORMAL LOW (ref 36.0–46.0)
Hemoglobin: 9.3 g/dL — ABNORMAL LOW (ref 12.0–15.0)
Lymphocytes Relative: 6 %
Lymphs Abs: 1.1 10*3/uL (ref 0.7–4.0)
MCH: 23.9 pg — ABNORMAL LOW (ref 26.0–34.0)
MCHC: 28.4 g/dL — ABNORMAL LOW (ref 30.0–36.0)
MCV: 84.3 fL (ref 78.0–100.0)
Metamyelocytes Relative: 0 %
Monocytes Absolute: 1.3 10*3/uL — ABNORMAL HIGH (ref 0.1–1.0)
Monocytes Relative: 7 %
Myelocytes: 0 %
Neutro Abs: 16.2 10*3/uL — ABNORMAL HIGH (ref 1.7–7.7)
Neutrophils Relative %: 87 %
Other: 0 %
Platelets: 174 10*3/uL (ref 150–400)
Promyelocytes Absolute: 0 %
RBC: 3.89 MIL/uL (ref 3.87–5.11)
RDW: 23.3 % — ABNORMAL HIGH (ref 11.5–15.5)
WBC: 18.6 10*3/uL — ABNORMAL HIGH (ref 4.0–10.5)
nRBC: 0 /100 WBC

## 2016-02-08 LAB — COMPREHENSIVE METABOLIC PANEL
ALT: 10 U/L — ABNORMAL LOW (ref 14–54)
AST: 22 U/L (ref 15–41)
Albumin: 2.6 g/dL — ABNORMAL LOW (ref 3.5–5.0)
Alkaline Phosphatase: 80 U/L (ref 38–126)
Anion gap: 15 (ref 5–15)
BUN: 92 mg/dL — ABNORMAL HIGH (ref 6–20)
CO2: 36 mmol/L — ABNORMAL HIGH (ref 22–32)
Calcium: 7.4 mg/dL — ABNORMAL LOW (ref 8.9–10.3)
Chloride: 89 mmol/L — ABNORMAL LOW (ref 101–111)
Creatinine, Ser: 6.23 mg/dL — ABNORMAL HIGH (ref 0.44–1.00)
GFR calc Af Amer: 7 mL/min — ABNORMAL LOW (ref >60)
GFR calc non Af Amer: 6 mL/min — ABNORMAL LOW (ref >60)
Glucose, Bld: 87 mg/dL (ref 65–99)
Potassium: 3.1 mmol/L — ABNORMAL LOW (ref 3.5–5.1)
Sodium: 140 mmol/L (ref 135–145)
Total Bilirubin: 1.5 mg/dL — ABNORMAL HIGH (ref 0.3–1.2)
Total Protein: 6.3 g/dL — ABNORMAL LOW (ref 6.5–8.1)

## 2016-02-08 LAB — LIPASE, BLOOD: Lipase: 38 U/L (ref 11–51)

## 2016-02-08 LAB — URINALYSIS, ROUTINE W REFLEX MICROSCOPIC
Glucose, UA: NEGATIVE mg/dL
Ketones, ur: NEGATIVE mg/dL
Nitrite: NEGATIVE
Protein, ur: 100 mg/dL — AB
Specific Gravity, Urine: 1.014 (ref 1.005–1.030)
pH: 6 (ref 5.0–8.0)

## 2016-02-08 LAB — GLUCOSE, CAPILLARY: GLUCOSE-CAPILLARY: 139 mg/dL — AB (ref 65–99)

## 2016-02-08 LAB — URINE MICROSCOPIC-ADD ON

## 2016-02-08 LAB — PROTIME-INR
INR: >10
Prothrombin Time: >90 seconds — ABNORMAL HIGH (ref 11.4–15.2)

## 2016-02-08 LAB — I-STAT CG4 LACTIC ACID, ED: Lactic Acid, Venous: 1.83 mmol/L (ref 0.5–1.9)

## 2016-02-08 LAB — TROPONIN I
Troponin I: 0.3 ng/mL (ref <0.03)
Troponin I: 0.64 ng/mL (ref <0.03)

## 2016-02-08 MED ORDER — METOPROLOL SUCCINATE ER 25 MG PO TB24
25.0000 mg | ORAL_TABLET | Freq: Every day | ORAL | Status: DC
  Administered 2016-02-09: 25 mg via ORAL
  Filled 2016-02-08 (×2): qty 1

## 2016-02-08 MED ORDER — ACETAMINOPHEN 650 MG RE SUPP: 650.0000 mg | Freq: Four times a day (QID) | RECTAL | Status: DC | PRN

## 2016-02-08 MED ORDER — VANCOMYCIN HCL 10 G IV SOLR
1500.0000 mg | Freq: Once | INTRAVENOUS | Status: AC
  Administered 2016-02-08: 1500 mg via INTRAVENOUS
  Filled 2016-02-08: qty 1500

## 2016-02-08 MED ORDER — POTASSIUM CHLORIDE CRYS ER 20 MEQ PO TBCR
40.0000 meq | EXTENDED_RELEASE_TABLET | Freq: Once | ORAL | Status: AC
  Administered 2016-02-08: 40 meq via ORAL
  Filled 2016-02-08: qty 2

## 2016-02-08 MED ORDER — LEVOTHYROXINE SODIUM 112 MCG PO TABS
112.0000 ug | ORAL_TABLET | Freq: Every day | ORAL | Status: DC
  Administered 2016-02-09 – 2016-02-19 (×11): 112 ug via ORAL
  Filled 2016-02-08 (×11): qty 1

## 2016-02-08 MED ORDER — PHYTONADIONE 5 MG PO TABS
2.5000 mg | ORAL_TABLET | Freq: Once | ORAL | Status: AC
  Administered 2016-02-08: 2.5 mg via ORAL
  Filled 2016-02-08: qty 1

## 2016-02-08 MED ORDER — AMIODARONE HCL 200 MG PO TABS
200.0000 mg | ORAL_TABLET | Freq: Two times a day (BID) | ORAL | Status: DC
  Administered 2016-02-08 – 2016-02-19 (×21): 200 mg via ORAL
  Filled 2016-02-08 (×22): qty 1

## 2016-02-08 MED ORDER — ATORVASTATIN CALCIUM 40 MG PO TABS
40.0000 mg | ORAL_TABLET | Freq: Every day | ORAL | Status: DC
  Administered 2016-02-08 – 2016-02-19 (×12): 40 mg via ORAL
  Filled 2016-02-08 (×11): qty 1

## 2016-02-08 MED ORDER — WITCH HAZEL-GLYCERIN EX PADS
MEDICATED_PAD | CUTANEOUS | Status: DC | PRN
  Filled 2016-02-08: qty 100

## 2016-02-08 MED ORDER — SODIUM CHLORIDE 0.9 % IV BOLUS (SEPSIS)
1000.0000 mL | Freq: Once | INTRAVENOUS | Status: AC
  Administered 2016-02-08: 1000 mL via INTRAVENOUS

## 2016-02-08 MED ORDER — ACETAMINOPHEN 325 MG PO TABS: 650.0000 mg | ORAL_TABLET | Freq: Four times a day (QID) | ORAL | Status: DC | PRN

## 2016-02-08 MED ORDER — DEXTROSE 5 % IV SOLN
1.0000 g | INTRAVENOUS | Status: DC
  Administered 2016-02-08 – 2016-02-09 (×2): 1 g via INTRAVENOUS
  Filled 2016-02-08 (×2): qty 10

## 2016-02-08 MED ORDER — GLIPIZIDE 5 MG PO TABS
5.0000 mg | ORAL_TABLET | Freq: Two times a day (BID) | ORAL | Status: DC
  Filled 2016-02-08: qty 1

## 2016-02-08 MED ORDER — SODIUM CHLORIDE 0.9% FLUSH
3.0000 mL | Freq: Two times a day (BID) | INTRAVENOUS | Status: DC
  Administered 2016-02-08 – 2016-02-19 (×19): 3 mL via INTRAVENOUS

## 2016-02-08 MED ORDER — PIPERACILLIN-TAZOBACTAM 3.375 G IVPB 30 MIN
3.3750 g | Freq: Once | INTRAVENOUS | Status: AC
  Administered 2016-02-08: 3.375 g via INTRAVENOUS
  Filled 2016-02-08: qty 50

## 2016-02-08 MED ORDER — SODIUM BICARBONATE 650 MG PO TABS
1300.0000 mg | ORAL_TABLET | Freq: Three times a day (TID) | ORAL | Status: DC
  Administered 2016-02-08 – 2016-02-19 (×32): 1300 mg via ORAL
  Filled 2016-02-08 (×34): qty 2

## 2016-02-08 MED ORDER — HYDROCORTISONE 2.5 % RE CREA
1.0000 "application " | TOPICAL_CREAM | Freq: Two times a day (BID) | RECTAL | Status: DC
  Administered 2016-02-08 – 2016-02-17 (×14): 1 via RECTAL
  Filled 2016-02-08 (×5): qty 28.35

## 2016-02-08 MED ORDER — CALCITRIOL 0.25 MCG PO CAPS
0.2500 ug | ORAL_CAPSULE | ORAL | Status: DC
  Administered 2016-02-11 – 2016-02-18 (×5): 0.25 ug via ORAL
  Filled 2016-02-08 (×5): qty 1

## 2016-02-08 MED ORDER — SENNOSIDES-DOCUSATE SODIUM 8.6-50 MG PO TABS: 2.0000 | ORAL_TABLET | Freq: Every day | ORAL | Status: DC | PRN

## 2016-02-08 MED ORDER — PANTOPRAZOLE SODIUM 40 MG PO TBEC
40.0000 mg | DELAYED_RELEASE_TABLET | Freq: Every day | ORAL | Status: DC
  Administered 2016-02-09 – 2016-02-19 (×11): 40 mg via ORAL
  Filled 2016-02-08 (×11): qty 1

## 2016-02-08 NOTE — ED Notes (Signed)
MD Wilson Singer made aware of troponin result.

## 2016-02-08 NOTE — ED Notes (Signed)
Pt placed on 2L [place] in response to o2 79%. O2 improved to 100%

## 2016-02-08 NOTE — ED Notes (Signed)
Attempted report 

## 2016-02-08 NOTE — H&P (Signed)
Date: 02/08/2016               Patient Name:  [name] MRN: [id number]  DOB: [birth year] Age / Sex: 81 y.o., female   PCP: [name], MD         Medical Service: Internal Medicine Teaching Service         Attending Physician: Dr. Carlyle Basques    First Contact: Dr. Inda Castle Pager: [phone]  Second Contact: Dr. Quay Burow Pager: [phone]       After Hours (After 5p/  First Contact Pager: [phone]  weekends / holidays): Second Contact Pager: [phone]   Chief Complaint: Tiredness  History of Present Illness: [name] is a 81 year old woman with history of HFrEF, AFib with RVR, CKD4, and DM2 who presents from a SNF with 1 day of somnolence and reduced oral intake.  History provided by patient and daughter [name], who has seen her daily at the facility.  She was recently hospitalized from 8/31-01/25/2016 for AFib with RVR, acute on chronic renal failure, and heart failure exacerbation, and discharged to the [facility].  She was doing well, with normal mental status (baseline alert, oriented, conversational, pleasant) and improving strength (working with PT to stand and walk with walker) until yesterday, when she was noted to be more tired and not interested in eating and drinking.  She is incontinent of urine at baseline.  The facility's physician referred her to the ED with concern for sepsis since she was hypotensive, tachycardic, and altered.   Meds:  Current Meds  Medication Sig  . acetaminophen (TYLENOL) 325 MG tablet Take 2 tablets (650 mg total) by mouth every 6 (six) hours as needed for mild pain (or Fever >/= 101). (Patient taking differently: Take 650 mg by mouth every 6 (six) hours as needed (pain/ fever >101). )  . amiodarone (PACERONE) 200 MG tablet Take 1 tablet (200 mg total) by mouth 2 (two) times daily. (Patient taking differently: Take 200 mg by mouth See admin instructions. Take 1 tablet ( 200 mg) by mouth twice daily unless SBP <110)  . atorvastatin  (LIPITOR) 40 MG tablet TAKE 1 TABLET BY MOUTH EVERY DAY (Patient taking differently: TAKE 1 TABLET BY MOUTH EVERY DAY AT 6PM)  . calcitRIOL (ROCALTROL) 0.25 MCG capsule Take 1 capsule by mouth every Monday, Wednesday, and Friday.  . cetirizine (ZYRTEC) 10 MG chewable tablet Chew 1 tablet (10 mg total) by mouth daily.  . Cholecalciferol (VITAMIN D3) 10000 UNITS capsule Take 1 capsule (10,000 Units total) by mouth once a week. (Patient taking differently: Take 10,000 Units by mouth every Tuesday. )  . clotrimazole (LOTRIMIN AF) 1 % cream Apply 1 application topically 2 (two) times daily.  . furosemide (LASIX) 80 MG tablet Take 1 tablet (80 mg total) by mouth 2 (two) times daily. (Patient taking differently: Take 80 mg by mouth See admin instructions. Take 1 tablet (80 mg) by mouth at 9am and 5pm unless SBP <110)  . glipiZIDE (GLUCOTROL) 5 MG tablet Take 5 mg by mouth 2 (two) times daily with a meal.  . hydrocortisone (ANUSOL-HC) 2.5 % rectal cream Place 1 application rectally 2 (two) times daily.  Marland Kitchen JANUVIA 25 MG tablet TAKE 1 TABLET BY MOUTH EVERY DAY  . levothyroxine (SYNTHROID, LEVOTHROID) 112 MCG tablet TAKE 1 TABLET(112 MCG) BY MOUTH DAILY BEFORE BREAKFAST  . lidocaine (LMX) 4 % cream Apply topically 2 (two) times daily as needed (rectal pain). (Patient taking differently: Apply  1 application topically 2 (two) times daily as needed (rectal pain). )  . Magnesium 400 MG TABS Take 400 mg by mouth daily.  . metolazone (ZAROXOLYN) 5 MG tablet Take 1 tablet (5 mg total) by mouth daily. 30 minutes before AM lasix (Patient taking differently: Take 5 mg by mouth See admin instructions. Take 1 tablet (5 mg) by mouth daily 30 minutes before lasix unless SBP <110)  . metoprolol succinate (TOPROL-XL) 25 MG 24 hr tablet Take 3 tablets (75 mg total) by mouth daily. (Patient taking differently: Take 75 mg by mouth See admin instructions. Take 3 tablets (75 mg) by mouth daily unless SBP <110)  . pantoprazole  (PROTONIX) 40 MG tablet TAKE 1 TABLET BY MOUTH EVERY DAY (Patient taking differently: TAKE 1 TABLET BY MOUTH EVERY DAY BEFORE BREAKFAST)  . polyethylene glycol (MIRALAX / GLYCOLAX) packet Take 17 g by mouth daily as needed for mild constipation or moderate constipation. Mix in 8 oz liquid and drink  . PRESCRIPTION MEDICATION Vitamin D injection monthly at cancer center  . sennosides-docusate sodium (SENOKOT-S) 8.6-50 MG tablet Take 2 tablets by mouth daily as needed for constipation.   . sodium bicarbonate 650 MG tablet Take 2 tablets (1,300 mg total) by mouth 3 (three) times daily.  Marland Kitchen warfarin (COUMADIN) 5 MG tablet Take 1 tablet (5 mg total) by mouth daily. (Patient taking differently: Take 5 mg by mouth at bedtime. )  . witch hazel-glycerin (TUCKS) pad Apply topically as needed for itching. (Patient taking differently: Apply 1 application topically as needed (rectal itching). )  . zinc oxide (BALMEX) 11.3 % CREA cream Apply 1 application topically 2 (two) times daily.    Allergies: Allergies as of 02/08/2016  . (No Known Allergies)   Past Medical History:  Diagnosis Date  . Anemia    b12 def, iron def, follow at cancer center, gets B12 and  another injection there. Could not remeber the name. Dr. Ralene Ok is her  cancer doctor  . Asthma   . Atrial fibrillation ([place])   . Blood transfusion    two or more yrs ago  . Chronic renal insufficiency   . Chronic venous insufficiency   . Diabetes mellitus   . Hyperlipidemia   . Hypertension   . Hypothyroidism    thyroid removed 4 or more yrs ago  . MGUS (monoclonal gammopathy of unknown significance)   . Osteomyelitis ([place])    s/p Rt 2nd toe and left 5 toes  amputation in 1/12  by Dr. Sharol Given  . Stroke ([place])   . Thyroid disease    hypothyroidism h/o hyperthyroidism s/p ablation/ectomy   Family History  Problem Relation Age of Onset  . Anemia Mother   . Colon cancer Mother     rectal  . HIV Brother   . Cancer Brother    Social History   Substance Use Topics  . Smoking status: Never Smoker  . Smokeless tobacco: Never Used  . Alcohol use No    Review of Systems: ROS limited by somnolence.  Denied chest pain, dyspnea, dysuria, abdominal pain, fevers, chills.  Physical Exam: Blood pressure 99/62, pulse 102, temperature 99.2 F (37.3 C), temperature source Rectal, resp. rate 18, SpO2 100 %.   Physical Exam  Constitutional:  Somnolent, chronically ill appearing woman  HENT:  Head: Normocephalic and atraumatic.  Dry lips, angular chelitis  Eyes: Conjunctivae are normal. No scleral icterus.  Neck: Normal range of motion. Neck supple.  Cardiovascular:  Tachycardic, irregularly irregular, 3/6 systolic murmur  loudest at RUSB  Pulmonary/Chest:  Normal work of breathing, good air movement, coarse bibasilar cracklers  Abdominal: Soft. She exhibits no distension. There is no tenderness.  Genitourinary:  Genitourinary Comments: No suprapubic or CVA tenderness  Musculoskeletal: She exhibits no edema or tenderness.  Bilateral transmetatarsal amputations  Neurological:  Somnolent, but arouses to voice or light touch before closing her eyes again Oriented to person, place, time, and situation  Skin:  Hands and feet slightly cool  Psychiatric: She has a normal mood and affect. Her behavior is normal.     CBC Latest Ref Rng & Units 02/08/2016 01/29/2016 01/29/2016  WBC 4.0 - 10.5 K/uL 18.6(H) 6.0 -  Hemoglobin 12.0 - 15.0 g/dL 9.3(L) 8.9(A) 8.9(A)  Hematocrit 36.0 - 46.0 % 32.8(L) 29(A) -  Platelets 150 - 400 K/uL 174 135(A) 135(A)   BMP Latest Ref Rng & Units 02/08/2016 01/29/2016 01/25/2016  Glucose 65 - 99 mg/dL 87 - 162(H)  BUN 6 - 20 mg/dL 92(H) 87(A) 72(H)  Creatinine 0.44 - 1.00 mg/dL 6.23(H) 5.9(A) 5.35(H)  BUN/Creat Ratio 11 - 26 - - -  Sodium 135 - 145 mmol/L 140 140 137  Potassium 3.5 - 5.1 mmol/L 3.1(L) 3.7 4.1  Chloride 101 - 111 mmol/L 89(L) - 99(L)  CO2 22 - 32 mmol/L 36(H) - 24  Calcium 8.9 - 10.3 mg/dL  7.4(L) - 7.8(L)   Urinalysis    Component Value Date/Time   COLORURINE AMBER (A) 02/08/2016 1230   APPEARANCEUR CLOUDY (A) 02/08/2016 1230   LABSPEC 1.014 02/08/2016 1230   PHURINE 6.0 02/08/2016 1230   GLUCOSEU NEGATIVE 02/08/2016 1230   HGBUR LARGE (A) 02/08/2016 1230   BILIRUBINUR SMALL (A) 02/08/2016 1230   KETONESUR NEGATIVE 02/08/2016 1230   PROTEINUR 100 (A) 02/08/2016 1230   UROBILINOGEN 0.2 12/12/2014 1710   NITRITE NEGATIVE 02/08/2016 1230   LEUKOCYTESUR LARGE (A) 02/08/2016 1230   Component     Latest Ref Rng & Units 02/08/2016  Squamous Epithelial / LPF     NONE SEEN 6-30 (A)  WBC, UA     0 - 5 WBC/hpf TOO NUMEROUS TO COUNT  RBC / HPF     0 - 5 RBC/hpf TOO NUMEROUS TO COUNT  Bacteria, UA     NONE SEEN MANY (A)   Lactic Acid, Venous    Component Value Date/Time   LATICACIDVEN 1.83 02/08/2016 1208   Cardiac Panel (last 3 results)  Recent Labs  02/08/16 1150  TROPONINI 0.30*   Lab Results  Component Value Date   INR >10.00 (HH) 02/08/2016   INR 2.8 (A) 01/30/2016   INR 1.52 01/25/2016   EKG: Afib, rate ~110, anterolateral TWI more prominent than prior in V2-V4  Chest Radiograph AP: bilateral small pleural effusions with pulmonary edema and bibasilar atelectasis  Assessment & Plan by Problem: Principal Problem:   UTI (lower urinary tract infection) Active Problems:   Hypothyroidism   Hyperlipidemia   CKD (chronic kidney disease), stage V (HCC)   Atrial fibrillation (HCC)   Anemia associated with chronic renal failure   External hemorrhoids  [name] is a elderly woman with multisystem severe chronic diseases who presents acutely with altered mental status, pyuria, bacteruria, and leukocytosis concerning for a urinary tract infection.  In the ED, she was started on broad spectrum antibiotics with vancomycin and piperacillin-tazobactam.  She was hypotensive to 80s/50s, but pressures responded to 2L of NS to 110s/70s, lactate <2, afebrile here, is not  septic.  #UTI Likely represents complicated cystitis.  No  recent antibiotic use or UTIs.  Had foley during previous admission, but no instrumentation in last 2 weeks.  No prior positive blood or urine cultures in EHR.  -D/c Vanc and Zosyn -Start Ceftriaxone -Daily CBC -F/u urine and blood cultures  #CHF Volume status challenging to assess. Pulmonary but no peripheral edema, hypotensive at presentation.  Good oxygenation on room air.  Volume status is precarious with CHF, CKD, and concern for sepsis. -Hold lasix and metolazone -Fluid restrict 1200 mL -I/Os and daily weights  #Acute on Chronic Kidney Disease Not a dialysis candidate.  Creatinine 5.35 on 9/8, 6.23 today.  Likely prerenal secondary to dehydration and infection. -hold lasix and metolazone -Daily BMPs -I/Os and daily weights -continue home calcimimetic, PO bicarb  #AF with RVR Rates moderately well controlled ~110, consistent with best rate control achieved on prior admission.  Unclear from [facility] whether she has be getting metoprolol and amiodarone. -Telemetry -Amiodarone 200 mg BID -Reduced metoprolol to 25 mg daily (from 75 mg daily)  #Elevated Troponins She has no chest pain or dyspnea, but has elevated troponins and possible new ischemic EKG changes.  Her elevated cardiac markers could represent NSTEMI, demand ischemia, or be artifactual due to her CKD.  She is currently anticoagulated with supratherapeutic INR, so unless STEMI emerges no further treatment would be available. -trend trops -repeat EKG tomorrow or if symptoms of chest pain/dyspnea  #Supratherapeutic INR On warfarin for AF stroke prophylaxis, INR goal 2-3, currently supratherapeutic with INR >10 -2.5 mg PO Vit K -Repeat INR tomorrow   #Hypokalemia -40mg  PO KCl -Follow electrolytes  #Chronic Anemia Likely multifactorial, with contributions of chronic renal disease, chronic disease, and MGUS/MM.  Received EPO and IV iron at last  admission.  Currently at or above baseline.  Followed by Oncology as outpatient with monthly EPO infections.  #DM2 -Continue home glipizide  #HL -Continue home atorvastatin 40 mg daily  #Hypothyroidism -Continue home levothyroxine  Dispo: Admit patient to Inpatient with expected length of stay greater than 2 midnights.  Signed: Minus Liberty, MD 02/08/2016, 2:12 PM  Pager: [phone]

## 2016-02-08 NOTE — ED Triage Notes (Signed)
Pt here from [facility]. Staff reports that pt BP has been "up and down over the last couple days." Staff also reports smell to urine. Pt is normally alert, talking and eating. Pt is currently lethargic, not eating. Pt moans when pressing abdomen.

## 2016-02-08 NOTE — Progress Notes (Addendum)
Pharmacy Antibiotic Note  [name] is a 81 y.o. female admitted on 02/08/2016 from nursing home with lethargy, fluctuating BP and foul smell to urine.  WBC 18.6, soft BP, LA wnl. Pt has end stage CKD but does not receive dialysis. Received zosyn x1 in ED. Will initiate empiric antibiotics for sepsis.   Plan: -Vancomycin 1500 mg IV x1 then 1g/48h -Monitor renal fx, cultures, may have to dose vancomycin off of random levels     Temp (24hrs), Avg:98.9 F (37.2 C), Min:98.5 F (36.9 C), Max:99.2 F (37.3 C)   Recent Labs Lab 02/08/16 1150 02/08/16 1208  WBC 18.6*  --   CREATININE 6.23*  --   LATICACIDVEN  --  1.83    Estimated Creatinine Clearance: 8.2 mL/min (by C-G formula based on SCr of 6.23 mg/dL (H)).    No Known Allergies   Antimicrobials this admission: 9/22 zosyn x1 9/22 vancomycin >   Dose adjustments this admission: NA   Microbiology results: 9/22 blood cx:   Thank you for allowing pharmacy to be a part of this patient's care.  Harvel Quale 02/08/2016 1:24 PM

## 2016-02-08 NOTE — ED Provider Notes (Signed)
[facility] DEPT Provider Note   CSN: [id number] Arrival date & time: 02/08/16  1127  By signing my name below, I, Rayna Sexton, attest that this documentation has been prepared under the direction and in the presence of Virgel Manifold, MD. Electronically Signed: Rayna Sexton, ED Scribe. 02/08/16. 11:36 AM.   History   Chief Complaint Chief Complaint  Patient presents with  . Hypotension  . Altered Mental Status   LEVEL 5 CAVEAT: AMS  HPI HPI Comments: [name] is a 81 y.o. female with a h/o stroke, anemia, HTN, HLD and a-fib who presents to the Emergency Department due to AMS x 2 days. Pt resides in assisted living and the staff reports that she has been experiencing fluctuating BP levels and AMS for the past two days. Per EMS, pt typically eats normally and talks at baseline and this morning did not eat breakfast. Per EMS, staff at her facility believe she has also experienced malodorous urine and a cough. Pt complains of abd pain and rectal pain.   The history is provided by the patient, a caregiver, medical records and the EMS personnel. The history is limited by the condition of the patient. No language interpreter was used.    Past Medical History:  Diagnosis Date  . Anemia    b12 def, iron def, follow at cancer center, gets B12 and  another injection there. Could not remeber the name. Dr. Ralene Ok is her  cancer doctor  . Asthma   . Atrial fibrillation ([place])   . Blood transfusion    two or more yrs ago  . Chronic renal insufficiency   . Chronic venous insufficiency   . Diabetes mellitus   . Hyperlipidemia   . Hypertension   . Hypothyroidism    thyroid removed 4 or more yrs ago  . MGUS (monoclonal gammopathy of unknown significance)   . Osteomyelitis ([place])    s/p Rt 2nd toe and left 5 toes  amputation in 1/12  by Dr. Sharol Given  . Stroke ([place])   . Thyroid disease    hypothyroidism h/o hyperthyroidism s/p ablation/ectomy    Patient Active Problem List   Diagnosis Date Noted  . Palliative care encounter   . Goals of care, counseling/discussion   . DNR (do not resuscitate)   . Cardiorenal syndrome   . Chronic atrial fibrillation ([place])   . Papular rash 09/19/2015  . Ulcer of left lower extremity ([place]) 09/19/2015  . Benign neoplasm of transverse colon 12/17/2014  . Gastritis   . Melena 12/12/2014  . UGI bleed 12/12/2014  . Absolute anemia   . CVA (cerebral vascular accident) ([place]) 10/01/2014  . TIA (transient ischemic attack) 09/30/2014  . Arthralgia of both hands 08/04/2014  . Preventative health care 05/03/2014  . Facet arthropathy of spine 04/26/2014  . Vitamin D deficiency 11/03/2012  . External hemorrhoids 06/28/2012  . Intermittent sleepiness 06/28/2012  . Anemia associated with chronic renal failure 04/03/2011  . MGUS (monoclonal gammopathy of unknown significance) 04/03/2011  . Atrial fibrillation ([place]) 01/24/2011  . History of osteomyelitis 09/05/2009  . Hypothyroidism 02/01/2007  . Diabetes mellitus with neuropathy ([place]) 07/03/2006  . CKD (chronic kidney disease) stage 4, GFR 15-29 ml/min ([facility]) 07/03/2006  . Hyperlipidemia 03/02/2006  . Essential hypertension 03/02/2006  . VENOUS INSUFFICIENCY 03/02/2006  . EDEMA LEG 03/02/2006  . History of amputation of lesser toe ([place]) 03/02/2006    Past Surgical History:  Procedure Laterality Date  . AMPUTATION  04/30/2011   Procedure: AMPUTATION FOOT;  Surgeon:  Newt Minion, MD;  Location: [facility];  Service: Orthopedics;  Laterality: Right;  Right Midfoot Amputation  . APPENDECTOMY     teenager  . COLONOSCOPY N/A 12/17/2014   Procedure: COLONOSCOPY;  Surgeon: Ladene Artist, MD;  Location: [facility] ENDOSCOPY;  Service: Endoscopy;  Laterality: N/A;  . ESOPHAGOGASTRODUODENOSCOPY N/A 12/15/2014   Procedure: ESOPHAGOGASTRODUODENOSCOPY (EGD);  Surgeon: Ladene Artist, MD;  Location[facility]COPY;  Service: Endoscopy;  Laterality: N/A;  . EYE SURGERY     cat ext ou    OB History    No  data available       Home Medications    Prior to Admission medications   Medication Sig Start Date End Date Taking? Authorizing Provider  acetaminophen (TYLENOL) 325 MG tablet Take 2 tablets (650 mg total) by mouth every 6 (six) hours as needed for mild pain (or Fever >/= 101). 01/25/16   Alexa Angela Burke, MD  amiodarone (PACERONE) 200 MG tablet Take 1 tablet (200 mg total) by mouth 2 (two) times daily. 01/25/16   Florinda Marker, MD  atorvastatin (LIPITOR) 40 MG tablet TAKE 1 TABLET BY MOUTH EVERY DAY 08/01/15   Bartholomew Crews, MD  Blood Glucose Monitoring Suppl (ONETOUCH VERIO FLEX SYSTEM) W/DEVICE KIT 1 each by Does not apply route every morning. Check blood sugar one time each day 05/07/15   [name], MD  calcitRIOL (ROCALTROL) 0.25 MCG capsule Take 1 capsule by mouth every Monday, Wednesday, and Friday. 04/10/14   Historical Provider, MD  cetirizine (ZYRTEC) 10 MG chewable tablet Chew 1 tablet (10 mg total) by mouth daily. 09/19/15   Juliet Rude, MD  Cholecalciferol (VITAMIN D3) 10000 UNITS capsule Take 1 capsule (10,000 Units total) by mouth once a week. 08/04/14   [name], MD  clotrimazole (LOTRIMIN) 1 % cream Apply 1 application topically 2 (two) times daily. 09/19/15   Carly Montey Hora, MD  furosemide (LASIX) 80 MG tablet Take 1 tablet (80 mg total) by mouth 2 (two) times daily. 01/25/16   Alexa Angela Burke, MD  glipiZIDE (GLUCOTROL) 10 MG tablet Take 0.5 tablets (5 mg total) by mouth 2 (two) times daily with a meal. 08/29/15   [name], MD  glucose blood (ONETOUCH VERIO) test strip Check blood sugar one time each day 05/07/15   [name], MD  hydrocortisone (ANUSOL-HC) 2.5 % rectal cream Place 1 application rectally 2 (two) times daily. 01/25/16   Alexa Angela Burke, MD  JANUVIA 25 MG tablet TAKE 1 TABLET BY MOUTH EVERY DAY 09/05/15   [name], MD  levothyroxine (SYNTHROID, LEVOTHROID) 112 MCG tablet TAKE 1 TABLET(112 MCG) BY MOUTH DAILY BEFORE BREAKFAST 10/24/15   [name],  MD  lidocaine (LMX) 4 % cream Apply topically 2 (two) times daily as needed (rectal pain). 01/25/16   Florinda Marker, MD  metolazone (ZAROXOLYN) 5 MG tablet Take 1 tablet (5 mg total) by mouth daily. 30 minutes before AM lasix 01/25/16   Alexa Angela Burke, MD  metoprolol succinate (TOPROL-XL) 25 MG 24 hr tablet Take 3 tablets (75 mg total) by mouth daily. 01/25/16 02/24/16  Florinda Marker, MD  ONETOUCH DELICA LANCETS FINE MISC Check blood sugar one time each day 05/07/15   [name], MD  pantoprazole (PROTONIX) 40 MG tablet TAKE 1 TABLET BY MOUTH EVERY DAY 07/07/15   [name], MD  polyethylene glycol (MIRALAX / GLYCOLAX) packet Take 17 g by mouth daily as needed for  mild constipation or moderate constipation.     Historical Provider, MD  sennosides-docusate sodium (SENOKOT-S) 8.6-50 MG tablet Take 2 tablets by mouth daily as needed for constipation.     Historical Provider, MD  sodium bicarbonate 650 MG tablet Take 2 tablets (1,300 mg total) by mouth 3 (three) times daily. 01/25/16 02/24/16  Alexa Angela Burke, MD  UNABLE TO FIND Inject 1 each into the skin every 28 (twenty-eight) days. Vitamin D injection monthly at the cancer center.    Historical Provider, MD  warfarin (COUMADIN) 5 MG tablet Take 1 tablet (5 mg total) by mouth daily. 01/25/16 02/24/16  Minus Liberty, MD  witch hazel-glycerin (TUCKS) pad Apply topically as needed for itching. 01/25/16   Florinda Marker, MD    Family History Family History  Problem Relation Age of Onset  . Anemia Mother   . Colon cancer Mother     rectal  . HIV Brother   . Cancer Brother     Social History Social History  Substance Use Topics  . Smoking status: Never Smoker  . Smokeless tobacco: Never Used  . Alcohol use No     Allergies   Review of patient's allergies indicates no known allergies.   Review of Systems Review of Systems  Unable to perform ROS: Mental status change  Gastrointestinal: Positive for abdominal pain and rectal pain.   Physical  Exam Updated Vital Signs There were no vitals taken for this visit.  Physical Exam  Constitutional: She is oriented to person, place, and time. She appears well-developed and well-nourished. She appears distressed.  Appears tired  HENT:  Head: Normocephalic.  Eyes: EOM are normal.  Neck: Normal range of motion.  Cardiovascular: Tachycardia present.   Pulmonary/Chest: Effort normal.  Abdominal: Soft. She exhibits no distension. There is tenderness.  Mild diffuse tenderness noted to the abd  Musculoskeletal: Normal range of motion.  Neurological: She is alert and oriented to person, place, and time.  Psychiatric: She has a normal mood and affect.  Nursing note and vitals reviewed.  ED Treatments / Results  Labs (all labs ordered are listed, but only abnormal results are displayed) Labs Reviewed  COMPREHENSIVE METABOLIC PANEL - Abnormal; Notable for the following:       Result Value   Potassium 3.1 (*)    Chloride 89 (*)    CO2 36 (*)    BUN 92 (*)    Creatinine, Ser 6.23 (*)    Calcium 7.4 (*)    Total Protein 6.3 (*)    Albumin 2.6 (*)    ALT 10 (*)    Total Bilirubin 1.5 (*)    GFR calc non Af Amer 6 (*)    GFR calc Af Amer 7 (*)    All other components within normal limits  PROTIME-INR - Abnormal; Notable for the following:    Prothrombin Time >90.0 (*)    INR >10.00 (*)    All other components within normal limits  URINALYSIS, ROUTINE W REFLEX MICROSCOPIC (NOT AT [facility]) - Abnormal; Notable for the following:    Color, Urine AMBER (*)    APPearance CLOUDY (*)    Hgb urine dipstick LARGE (*)    Bilirubin Urine SMALL (*)    Protein, ur 100 (*)    Leukocytes, UA LARGE (*)    All other components within normal limits  CBC WITH DIFFERENTIAL/PLATELET - Abnormal; Notable for the following:    WBC 18.6 (*)    Hemoglobin 9.3 (*)    HCT  32.8 (*)    MCH 23.9 (*)    MCHC 28.4 (*)    RDW 23.3 (*)    Neutro Abs 16.2 (*)    Monocytes Absolute 1.3 (*)    All other  components within normal limits  TROPONIN I - Abnormal; Notable for the following:    Troponin I 0.30 (*)    All other components within normal limits  URINE MICROSCOPIC-ADD ON - Abnormal; Notable for the following:    Squamous Epithelial / LPF 6-30 (*)    Bacteria, UA MANY (*)    All other components within normal limits  CULTURE, BLOOD (ROUTINE X 2)  CULTURE, BLOOD (ROUTINE X 2)  URINE CULTURE  LIPASE, BLOOD  I-STAT CG4 LACTIC ACID, ED  I-STAT CG4 LACTIC ACID, ED    EKG  EKG Interpretation  Date/Time:  Friday February 08 2016 11:35:55 EDT Ventricular Rate:  109 PR Interval:    QRS Duration: 118 QT Interval:  444 QTC Calculation: 598 R Axis:   -30 Text Interpretation:  Atrial fibrillation Nonspecific intraventricular conduction delay Low voltage, extremity leads Nonspecific repol abnormality, diffuse leads Confirmed by Wilson Singer  MD, Emi Lymon [phone]) on 02/08/2016 11:57:14 AM       Radiology Dg Chest Portable 1 View  Result Date: 02/08/2016 CLINICAL DATA:  Hypotension. EXAM: PORTABLE CHEST 1 VIEW COMPARISON:  Single-view of the chest 01/17/2016 and 12/12/2014. PA and lateral chest 04/28/2011. FINDINGS: There is cardiomegaly and interstitial pulmonary edema. Small bilateral pleural effusions and basilar atelectasis are seen, worse on the left. No pneumothorax. Extensive aortic atherosclerosis is noted. Surgical clips at the base of the neck are seen. IMPRESSION: Cardiomegaly and interstitial edema with bilateral pleural effusions and basilar atelectasis. Electronically Signed   By: Inge Rise M.D.   On: 02/08/2016 12:00    Procedures Procedures   CRITICAL CARE Performed by: Virgel Manifold Total critical care time: 35 minutes Critical care time was exclusive of separately billable procedures and treating other patients. Critical care was necessary to treat or prevent imminent or life-threatening deterioration. Critical care was time spent personally by me on the following  activities: development of treatment plan with patient and/or surrogate as well as nursing, discussions with consultants, evaluation of patient's response to treatment, examination of patient, obtaining history from patient or surrogate, ordering and performing treatments and interventions, ordering and review of laboratory studies, ordering and review of radiographic studies, pulse oximetry and re-evaluation of patient's condition.  COORDINATION OF CARE: 11:36 AM Discussed next steps with pt. Pt verbalized understanding and is agreeable with the plan.    Medications Ordered in ED Medications - No data to display   Initial Impression / Assessment and Plan / ED Course  I have reviewed the triage vital signs and the nursing notes.  Pertinent labs & imaging results that were available during my care of the patient were reviewed by me and considered in my medical decision making (see chart for details).  Clinical Course    80yF with decreased mental status from her baseline. Suspect sepsis. Received empiric abx. Afebrile but has significant leukocytosis and hypotension. UA consistent with UTI. Tachycardic, but hx of afib. HR reasonable right around 100. Did get IVF for her BP which may pose further challenges with her HF and renal function. Eliquis changed to coumadin during last hospitalization and INR>10. No overt bleeding. Suspect hypotension not from blood loss. Her H/H is stable. Family at bedside. Goals of care need to be further addressed. I briefly did at bedside. Recent  notes mention palliative car/ehospice but daughter is saying Full Code.    Final Clinical Impressions(s) / ED Diagnoses   Final diagnoses:  Severe sepsis ([place])  UTI (lower urinary tract infection)  Supratherapeutic INR    New Prescriptions New Prescriptions   No medications on file     Virgel Manifold, MD 02/08/16 1428

## 2016-02-08 NOTE — Progress Notes (Signed)
LOCATION: [name]  PCP: [name], MD   Code Status: DNR  Goals of care: Advanced Directive information Advanced Directives 01/28/2016  Does patient have an advance directive? Yes  Type of Advance Directive Out of facility DNR (pink MOST or yellow form)  Does patient want to make changes to advanced directive? No - Patient declined  Copy of advanced directive(s) in chart? Yes  Would patient like information on creating an advanced directive? -  Pre-existing out of facility DNR order (yellow form or pink MOST form) -       Extended Emergency Contact Information Primary Emergency Contact: [name], [place] Phone: [phone] Work Phone: [phone] Relation: Daughter Secondary Emergency Contact: [place] of [employer] Phone: [phone] Relation: Son   No Known Allergies  Chief Complaint  Patient presents with  . Acute Visit    Generalized weakness, Increased INR, Low Blood Pressure     HPI:  Patient is a 81 y.o. female seen today for acute visit. Staff reports her having poor po intake, complaining of generalized weakness and now having low blood pressure x 1 day. Patient seen in her room. She appears pleasantly confused and is very lethargic. She does not participate in HPi and ROS. She has been here for short term rehabilitation post hospital admission from 01/17/16-01/25/16 with afib with RVR with CHF exacerbation and acute on chronic renal failure. Her daughter is present at bedside.   Review of Systems:  Unable to obtain. No fever reported by nursing. Low BP reading on review. Has not had her breakfast this am    Past Medical History:  Diagnosis Date  . Anemia    b12 def, iron def, follow at cancer center, gets B12 and  another injection there. Could not remeber the name. Dr. Ralene Ok is her  cancer doctor  . Asthma   . Atrial fibrillation ([place])   . Blood transfusion      two or more yrs ago  . Chronic renal insufficiency   . Chronic venous insufficiency   . Diabetes mellitus   . Hyperlipidemia   . Hypertension   . Hypothyroidism    thyroid removed 4 or more yrs ago  . MGUS (monoclonal gammopathy of unknown significance)   . Osteomyelitis ([place])    s/p Rt 2nd toe and left 5 toes  amputation in 1/12  by Dr. Sharol Given  . Stroke ([place])   . Thyroid disease    hypothyroidism h/o hyperthyroidism s/p ablation/ectomy   Past Surgical History:  Procedure Laterality Date  . AMPUTATION  04/30/2011   Procedure: AMPUTATION FOOT;  Surgeon: Newt Minion, MD;  Location: [facility];  Service: Orthopedics;  Laterality: Right;  Right Midfoot Amputation  . APPENDECTOMY     teenager  . COLONOSCOPY N/A 12/17/2014   Procedure: COLONOSCOPY;  Surgeon: Ladene Artist, MD;  Location: [facility] ENDOSCOPY;  Service: Endoscopy;  Laterality: N/A;  . ESOPHAGOGASTRODUODENOSCOPY N/A 12/15/2014   Procedure: ESOPHAGOGASTRODUODENOSCOPY (EGD);  Surgeon: Ladene Artist, MD;  Location: [facility] ENDOSCOPY;  Service: Endoscopy;  Laterality: N/A;  . EYE SURGERY     cat ext ou   Social History:   reports that she has never smoked. She has never used smokeless tobacco. She reports that she does not drink alcohol or use drugs.  Family History  Problem Relation Age of Onset  . Anemia Mother   . Colon cancer Mother  rectal  . HIV Brother   . Cancer Brother     Medications:   Medication List       Accurate as of 02/08/16 11:01 AM. Always use your most recent med list.          acetaminophen 325 MG tablet Commonly known as:  TYLENOL Take 2 tablets (650 mg total) by mouth every 6 (six) hours as needed for mild pain (or Fever >/= 101).   amiodarone 200 MG tablet Commonly known as:  PACERONE Take 1 tablet (200 mg total) by mouth 2 (two) times daily.   atorvastatin 40 MG tablet Commonly known as:  LIPITOR TAKE 1 TABLET BY MOUTH EVERY DAY   calcitRIOL 0.25 MCG capsule Commonly known as:   ROCALTROL Take 1 capsule by mouth every Monday, Wednesday, and Friday.   cetirizine 10 MG chewable tablet Commonly known as:  ZYRTEC Chew 1 tablet (10 mg total) by mouth daily.   clotrimazole 1 % cream Commonly known as:  LOTRIMIN Apply 1 application topically 2 (two) times daily.   furosemide 80 MG tablet Commonly known as:  LASIX Take 1 tablet (80 mg total) by mouth 2 (two) times daily.   glipiZIDE 10 MG tablet Commonly known as:  GLUCOTROL Take 0.5 tablets (5 mg total) by mouth 2 (two) times daily with a meal.   glucose blood test strip Commonly known as:  ONETOUCH VERIO Check blood sugar one time each day   hydrocortisone 2.5 % rectal cream Commonly known as:  ANUSOL-HC Place 1 application rectally 2 (two) times daily.   JANUVIA 25 MG tablet Generic drug:  sitaGLIPtin TAKE 1 TABLET BY MOUTH EVERY DAY   levothyroxine 112 MCG tablet Commonly known as:  SYNTHROID, LEVOTHROID TAKE 1 TABLET(112 MCG) BY MOUTH DAILY BEFORE BREAKFAST   lidocaine 4 % cream Commonly known as:  LMX Apply topically 2 (two) times daily as needed (rectal pain).   metolazone 5 MG tablet Commonly known as:  ZAROXOLYN Take 1 tablet (5 mg total) by mouth daily. 30 minutes before AM lasix   metoprolol succinate 25 MG 24 hr tablet Commonly known as:  TOPROL-XL Take 3 tablets (75 mg total) by mouth daily.   ONETOUCH DELICA LANCETS FINE Misc Check blood sugar one time each day   ONETOUCH VERIO FLEX SYSTEM w/Device Kit 1 each by Does not apply route every morning. Check blood sugar one time each day   pantoprazole 40 MG tablet Commonly known as:  PROTONIX TAKE 1 TABLET BY MOUTH EVERY DAY   polyethylene glycol packet Commonly known as:  MIRALAX / GLYCOLAX Take 17 g by mouth daily as needed for mild constipation or moderate constipation.   sennosides-docusate sodium 8.6-50 MG tablet Commonly known as:  SENOKOT-S Take 2 tablets by mouth daily as needed for constipation.   sodium bicarbonate  650 MG tablet Take 2 tablets (1,300 mg total) by mouth 3 (three) times daily.   UNABLE TO FIND Inject 1 each into the skin every 28 (twenty-eight) days. Vitamin D injection monthly at the cancer center.   Vitamin D3 10000 units capsule Take 1 capsule (10,000 Units total) by mouth once a week.   warfarin 5 MG tablet Commonly known as:  COUMADIN Take 1 tablet (5 mg total) by mouth daily.   witch hazel-glycerin pad Commonly known as:  TUCKS Apply topically as needed for itching.       Immunizations: Immunization History  Administered Date(s) Administered  . PPD Test 01/25/2016  . Pneumococcal Polysaccharide-23 04/15/2005  Physical Exam: BP (!) 86/40   Pulse (!) 119   Temp 98.5 F (36.9 C) (Oral)   Resp 16   Ht _0  (1.676 m)   Wt 189 lb (85.7 kg)   BMI 30.51 kg/m    General- elderly female, frail, in no acute distress Head- normocephalic, atraumatic Nose- no nasal discharge Throat- dry mucus membrane Eyes- PERRLA, EOMI, no pallor, no icterus, no discharge, normal conjunctiva, normal sclera Neck- no cervical lymphadenopathy Cardiovascular- irregular heart rate, + systolic murmur, trace leg edema, tachycardic Respiratory- bilateral poor air movement with some crackles to her bases, no wheeze, no rhonchi, no use of accessory muscles Abdomen- bowel sounds present, soft, non tender Musculoskeletal- able to move all 4 extremities to some extent, generalized weakness present Neurological- somnolent Skin- warm and dry, ecchymoses to arms    Labs reviewed: Basic Metabolic Panel:  Recent Labs  01/23/16 0304 01/23/16 1116 01/24/16 0305 01/25/16 0222 01/29/16  NA 138  --  138 137 140  K 4.2  --  4.1 4.1 3.7  CL 101  --  103 99*  --   CO2 18*  --  21* 24  --   GLUCOSE 167*  --  150* 162*  --   BUN 69*  --  70* 72* 87*  CREATININE 5.52*  --  5.30* 5.35* 5.9*  CALCIUM 7.2*  --  7.5* 7.8*  --   MG  --  1.6* 1.6* 1.6*  --    Liver Function Tests:  Recent  Labs  10/11/15 1058 01/08/16 1348 01/17/16 2003 01/29/16  AST 12 16 36 18  ALT <9 <_1 ALKPHOS 79 119 117 100  BILITOT 0.44 0.88 1.6*  --   PROT 7.8 7.6  7.2 7.2  --   ALBUMIN 3.3* 3.2* 3.5  --     Recent Labs  01/17/16 2003  LIPASE 65*   No results for input(s): AMMONIA in the last 8760 hours. CBC:  Recent Labs  12/07/15 1426 01/08/16 1348  01/17/16 2003 01/18/16 0455 01/22/16 0244 01/24/16 0828 01/29/16  WBC 6.4 6.0  < > 9.2 8.3 7.1 6.1 6.0  NEUTROABS 4.5 4.2  --  7.8*  --   --   --   --   HGB 8.0* 8.8*  < > 9.2* 8.5* 8.2* 8.0* 8.9*  8.9*  HCT 25.9* 29.1*  < > 30.5* 29.0* 27.4* 26.4* 29*  MCV 80.9 78.0*  < > 78.0 79.2 76.5* 75.4*  --   PLT 127* 184  < > 253 209 139* 104* 135*  135*  < > = values in this interval not displayed. Cardiac Enzymes: No results for input(s): CKTOTAL, CKMB, CKMBINDEX, TROPONINI in the last 8760 hours. BNP: Invalid input(s): POCBNP CBG:  Recent Labs  01/21/16 1625 01/21/16 2114 01/22/16 0840  GLUCAP 182* 154* 121*    Radiological Exams: Ct Abdomen Pelvis Wo Contrast  Result Date: 01/17/2016 CLINICAL DATA:  Rectal pain for 1 day, worse with sitting. EXAM: CT ABDOMEN AND PELVIS WITHOUT CONTRAST TECHNIQUE: Multidetector CT imaging of the abdomen and pelvis was performed following the standard protocol without IV contrast. COMPARISON:  Ultrasound 02/01/2013 FINDINGS: There is hyperdense layering material within the gallbladder lumen, possibly milk of calcium. Discrete calculi are not visible. No bile duct dilatation. Unremarkable unenhanced appearances of the liver. There are unremarkable unenhanced appearances of the pancreas, spleen and adrenals. Kidneys appear mildly atrophic but are otherwise unremarkable. Collecting systems and ureters are unremarkable. Urinary bladder exhibits mild uniform mural  thickening which could represent cystitis although it may also be related to the incomplete urinary bladder distention. There are  unremarkable appearances of the stomach, small bowel and colon except for mild uncomplicated diverticulosis. The normal caliber aorta is heavily calcified. There are multiple calcified uterine fibroids measuring up to about 2.7 cm. No adnexal mass is evident. No focal acute inflammatory changes are evident in the abdomen or pelvis. There is a small volume ascites collected in the left adnexal region. There is a fat containing umbilical hernia In the lower chest, there is a moderate left pleural effusion which is incompletely imaged. There are posterior lung base opacities which are incompletely imaged but more likely represent benign scarring or atelectasis. No significant skeletal lesion is evident. IMPRESSION: 1. No acute inflammatory changes are evident in the abdomen or pelvis. No bowel obstruction or perforation. No perianal or perirectal abscess to account for the rectal pain. 2. Multiple calcified uterine fibroids measuring up to about 2.7 cm. 3. Hyperdense layering material within the gallbladder lumen, possibly milk of calcium bile. 4. Fat containing umbilical hernia. 5. Moderate left pleural effusion, incompletely imaged. Electronically Signed   By: Andreas Newport M.D.   On: 01/17/2016 21:25   Dg Abdomen Acute W/chest  Result Date: 01/17/2016 CLINICAL DATA:  Rectal pain for 1 day. EXAM: DG ABDOMEN ACUTE W/ 1V CHEST COMPARISON:  December 12, 2014 FINDINGS: There is no evidence of dilated bowel loops or free intraperitoneal air. No radiopaque calculi or other significant radiographic abnormality is seen. Degenerative joint changes of the spine and scoliosis of the spine are identified. Images of the lungs demonstrate patchy consolidation of left lung base. There bilateral pleural effusions. There is increased central pulmonary vessel caliber. The heart size is enlarged. IMPRESSION: No bowel obstruction or free air. Bilateral pleural effusions. Patchy consolidation of left lung base, underlying pneumonia  is not excluded. Central pulmonary vascular congestion with cardiomegaly. Electronically Signed   By: Abelardo Diesel M.D.   On: 01/17/2016 18:54    Assessment/Plan  Altered mental state Concern for sepsis with her low bp, tachycardia and AMS. She has ckd stage 5 and is not a dialysis candidate. Her uremia could be contributing to this as well. With concern of hypovolemic shock, reviewed goals of care with daughter/ HCPOA and she would like for her to be sent to ED for further evaluation.  Sepsis With her being hypotensive, tachycardic and having altered mental state. She has had decreased po intake and likely is hypovolemic but also has crackles to her lung bases. She likely needs iv fluid but in a monitored setting with her history of chf and ckd. Will need infectious workup.   Tachycardia With chf exacerbation and sepsis, will need to rule out arrythmia with her hx of afib  Acute chf exacerbation With crackles on lung exam. Sending to ED for further management  supratherapeutic inr Machine in facility unable to register her INR. Will need coagulation profile checked with concern for supratherapeutic inr  ckd stage 5 With likely acute worsening with her dehydration, will need lab work   Pharmacist, hospital Communication: reviewed care plan with patient's daughter and Therapist, art. Spent more that 45 minutes in patient care co-ordination. EMS has been called and patient being transferred to the ED.    Blanchie Serve, MD Internal Medicine [facility] [address] [place],  [address] Cell Phone (Monday-Friday 8 am - 5 pm): [phone] On Call: [phone] and follow prompts after 5  pm and on weekends Office Phone: [phone] Office Fax: [phone]

## 2016-02-08 NOTE — ED Notes (Signed)
Per RN on 2C- Pt cannot come up at this time due to another patient using the room.

## 2016-02-08 NOTE — Discharge Summary (Signed)
Name: [name] MRN: [id number] DOB: 01-Jul-1934 81 y.o. PCP: [name], MD  Date of Admission: 02/08/2016 11:27 AM Date of Discharge: 02/19/2016 Attending Physician: [name], MD  Discharge Diagnosis: 1. E Coli UTI and Bacteremia  Discharge Medications:   Medication List    STOP taking these medications   BALMEX 11.3 % Crea cream Generic drug:  zinc oxide   clotrimazole 1 % cream Commonly known as:  LOTRIMIN   glipiZIDE 10 MG tablet Commonly known as:  GLUCOTROL   glipiZIDE 5 MG tablet Commonly known as:  GLUCOTROL   glucose blood test strip Commonly known as:  ONETOUCH VERIO   JANUVIA 25 MG tablet Generic drug:  sitaGLIPtin   LOTRIMIN AF 1 % cream Generic drug:  clotrimazole   Magnesium 400 MG Tabs   metolazone 5 MG tablet Commonly known as:  ZAROXOLYN   metoprolol succinate 25 MG 24 hr tablet Commonly known as:  TOPROL-XL   ONETOUCH DELICA LANCETS FINE Misc   ONETOUCH VERIO FLEX SYSTEM w/Device Kit   polyethylene glycol packet Commonly known as:  MIRALAX / GLYCOLAX   PRESCRIPTION MEDICATION   Vitamin D3 10000 units capsule   warfarin 5 MG tablet Commonly known as:  COUMADIN     TAKE these medications   acetaminophen 325 MG tablet Commonly known as:  TYLENOL Take 2 tablets (650 mg total) by mouth every 6 (six) hours as needed for mild pain (or Fever >/= 101). What changed:  reasons to take this   amiodarone 200 MG tablet Commonly known as:  PACERONE Take 1 tablet (200 mg total) by mouth 2 (two) times daily. What changed:  when to take this  additional instructions   aspirin 81 MG EC tablet Take 1 tablet (81 mg total) by mouth daily.   atorvastatin 40 MG tablet Commonly known as:  LIPITOR Take 1 tablet (40 mg total) by mouth daily. What changed:  See the new instructions.   calcitRIOL 0.25 MCG capsule Commonly known as:  ROCALTROL Take 1 capsule by mouth every Monday, Wednesday, and Friday.   cetirizine 10 MG  chewable tablet Commonly known as:  ZYRTEC Chew 1 tablet (10 mg total) by mouth daily.   furosemide 20 MG tablet Commonly known as:  LASIX Take 1 tablet (20 mg total) by mouth 2 (two) times daily. What changed:  medication strength  how much to take   guaiFENesin 600 MG 12 hr tablet Commonly known as:  MUCINEX Take 1 tablet (600 mg total) by mouth 2 (two) times daily.   guaiFENesin-dextromethorphan 100-10 MG/5ML syrup Commonly known as:  ROBITUSSIN DM Take 5 mLs by mouth every 4 (four) hours as needed for cough.   hydrocortisone 2.5 % rectal cream Commonly known as:  ANUSOL-HC Place 1 application rectally 2 (two) times daily.   levothyroxine 112 MCG tablet Commonly known as:  SYNTHROID, LEVOTHROID Take 1 tablet (112 mcg total) by mouth daily before breakfast. What changed:  See the new instructions.   lidocaine 4 % cream Commonly known as:  LMX Apply topically 2 (two) times daily as needed (rectal pain). What changed:  how much to take   metoprolol tartrate 25 MG tablet Commonly known as:  LOPRESSOR Take 0.5 tablets (12.5 mg total) by mouth 2 (two) times daily.   ondansetron 4 MG tablet Commonly known as:  ZOFRAN Take 1 tablet (4 mg total) by mouth every 8 (eight) hours as needed for nausea or vomiting.   pantoprazole 40 MG tablet Commonly known  as:  PROTONIX TAKE 1 TABLET BY MOUTH EVERY DAY What changed:  See the new instructions.   potassium chloride SA 20 MEQ tablet Commonly known as:  K-DUR,KLOR-CON Take 1 tablet (20 mEq total) by mouth daily.   sennosides-docusate sodium 8.6-50 MG tablet Commonly known as:  SENOKOT-S Take 2 tablets by mouth daily as needed for constipation.   sodium bicarbonate 650 MG tablet Take 2 tablets (1,300 mg total) by mouth 3 (three) times daily.   witch hazel-glycerin pad Commonly known as:  TUCKS Apply topically as needed for itching. What changed:  how much to take  reasons to take this       Disposition and  follow-up:   Ms.Elysha F Overholt was discharged from [facility] in Stable condition.  At the hospital follow up visit please address:  1.  Anticoagulation.  Needs INR re check. Patient was supra therapeutic on admission requiring reversal. Patient continued to be very sensitive to low doses of warfarin with labile INRs in the 4-5 range. INRs remained elevated even after holding warfarin up to 48 hours following a single dose. She required po Vit K on multiple occasions. Bleeding risk deemed to out weight benefit of anticoagulation and warfarin was discontinued all together. Patient started on ASA 81 mg daily.  2. Volume Status.  Monitor volume status closely with her CKD and CHF.  Significantly reduced her diuretic regimen from prior to admission. Now on Lasix 20 BID.   3. Diabetes. Stopped all DM medications.   4. A-fib: Metoprolol was initially held due to sepsis and tachycardia. Restarted at low dose 12.5 mg BID. Up titrate as BP tolerates.  5.  Labs / imaging needed at time of follow-up:  INR, Repeat TSH & Free T4, BMET, and monitor weight  6.  Pending labs/ test needing follow-up: None  Follow-up Appointments: Follow-up Information    [name], MD Follow up on 02/21/2016.   Specialty:  Internal Medicine Why:  10:45 AM Contact information: [place] [address] [place] Hospital Course by problem list: Principal Problem:   E. coli sepsis ([place]) Active Problems:   Hypothyroidism   Hyperlipidemia   CKD (chronic kidney disease), stage V (HCC)   Atrial fibrillation (HCC)   Anemia associated with chronic renal failure   External hemorrhoids   UTI (lower urinary tract infection)   Supratherapeutic INR   Chronic systolic heart failure (HCC)   Biventricular heart failure   Decubitus ulcer of sacral region, stage 1   1. UTI and Bacteremia Ms Madewell presented from her rehab facility with 2 day of lethargy and reduced PO intake.   She was found to have bacteruria and pyuria, and urine and blood cultures eventually grew pan-sensitive E Coli.  In the ED he received one dose of vancomycin and piperacillin-tazobactam, then transitioned to IV ceftriaxone for 3 days, then renally dosed oral cephalexin to complete 7 days (last day 9/28) of antibiotics for gram-negative bacteremia and complicated UTI.  She was afebrile throughout the admission, with leukocytosis to 18.6 which resolved after starting antibiotics.  She was hypotensive to 80s/50s in the ED and received 2L normal saline with good response.  With good clinical improvement and culture results showing pan-sensitive E Coli, she was transitioned to oral cephalexin on HD4.    On HD5, she became hypotensive with nadir of 51/31 and her WBC increased to 11.5, though she remained afebrile and her HRs did  not increase beyond 110s.  With uncertainty about the etiology of her change in clinical status, broad diagnostics were obtained, including chest radiograph, EKG, CT head, and CT abdomen and pelvis.  She had no hematomas, but CT showed a small pericardial effusion.  Echocardiogram was obtained to further assess the pericardial effusion.  The effusion was small and not hemodynamically significant, but EF was measured at 20-25%, decreased from 30-35% one month ago.  She was aggressively volume resuscitated, transferred to step down, and critical care was consulted to assist with management.  Diuretics and beta blockers were held, and she was restarted on IV ceftriaxone.  She did not receive pressors, and never became hypoxic.  Her blood pressures and symptoms improved, and she was kept on IV ceftriaxone to complete 10 days of IV antibiotics for her bacteremia. After completion of abx she subsequently became tachycardic to 120s and her metoprolol was restarted at 12.5 mg BID.   2. Supratherapeutic INR She presented with grossly supratherapeutic INR >10.  Despite repeated oral dosing of Vitamin  K, her INR remained extremely elevated.  She had some mild cutaneous ecchymoses, but not active bleeding and stable Hgb at her baseline.  When she became hypotensive, she received 3U FFP and IV Vitamin K which brought her INR down into the therapeutic range.  Warfarin was then resumed with 5 mg and 1 mg on consecutive days, and her INR increased from 2.10 to 4.02.  Patient continued to be very sensitive to low doses of warfarin with labile INRs in the 4-5 range. INRs remained elevated even after holding warfarin up to 48 hours following a single dose. She required po Vit K on multiple occasions. Bleeding risk deemed to out weight benefit of anticoagulation and warfarin was discontinued all together. Patient started on ASA 81 mg daily.  3. Troponinemia At presentation, her EKG showed more pronounced TWI than priors in anteroseptal leads and elevated troponins.  She never experience chest pain or dyspnea.  Since she was already anticoagulated with supratherapeutic INR, her comorbidities limit possible interventions, and she was not having ST-elevations, she was observed and her troponins were trended.  Troponinemia peaked on the night of admission and began downtrending thereafter.  Her troponinemia likely represented demand ischemia in the setting of infection.  4. Acute on Chronic Kidney Disease Baseline CKD5, Creatinine up to 6.23 from baseline ~5.5 at admission.  Renal function improved to baseline with initial rehydration and treatment of her infection, suggesting prerenal etiology secondary to infection.  5. Atrial Fibrillation with RVR With her initial hypotension, her metoprolol dose was reduced to 25 mg daily at admission and continued on 200 mg PO amiodarone.  Her blood pressure improved and metoprolol was increased to 50 mg daily and rates were well controlled in 100s.  When she became hypotensive, beta blockers were held and HR remained in 100s on amiodarone until the day prior to discharge.  Patient became persistently tachycardic to 120s and low dose metoprolol 12.5 BID was restarted. May be up titrated outpatient as blood pressure tolerates.   6. Congestive Heat Failure with Reduced EF Slightly hypotensive at admission, given 2L IVF and appeared euvolemic.  She had no peripheral edema, and small pleural effusions with mild pulmonary edema on chest radiographs.  Since she was clinically euvolemic, her diuretic regimen was reduced to 40 mg PO lasix BID and her volume status was stable.  When she became hypotensive, diuretics were held for 3 days before being resumed at lower dose of 20  mg PO lasix BID. She tolerated this dose well and was euvolemic on discharge.   7. Hypokalemia Asymptomatic, resolved with oral supplementation.  Normal magnesium.  8. Hypothyroidism: Patient's TSH was elevated this admission from 5 to 11. Free T4 was within normal range. Likely due to acute illness. We continued her home synthroid dose 112 mcg daily. She will need TSH and free T4 recheck at PCP follow up.   Discharge Vitals:   BP 100/60 (BP Location: Right Arm)   Pulse (!) 103   Temp 97.7 F (36.5 C) (Oral)   Resp 18   Ht _0  (1.702 m)   Wt 178 lb 5.6 oz (80.9 kg)   SpO2 95%   BMI 27.93 kg/m   Pertinent Labs, Studies, and Procedures:    Component     Latest Ref Rng & Units 02/08/2016  Color, Urine     YELLOW AMBER (A)  Appearance     CLEAR CLOUDY (A)  Specific Gravity, Urine     1.005 - 1.030 1.014  pH     5.0 - 8.0 6.0  Glucose     NEGATIVE mg/dL NEGATIVE  Hgb urine dipstick     NEGATIVE LARGE (A)  Bilirubin Urine     NEGATIVE SMALL (A)  Ketones, ur     NEGATIVE mg/dL NEGATIVE  Protein     NEGATIVE mg/dL 100 (A)  Urobilinogen, UA     0.0 - 1.0 mg/dL   Nitrite     NEGATIVE NEGATIVE  Leukocytes, UA     NEGATIVE LARGE (A)   Component     Latest Ref Rng & Units 02/08/2016  Squamous Epithelial / LPF     NONE SEEN 6-30 (A)  WBC, UA     0 - 5 WBC/hpf TOO NUMEROUS TO  COUNT  RBC / HPF     0 - 5 RBC/hpf TOO NUMEROUS TO COUNT  Bacteria, UA     NONE SEEN MANY (A)    Urine Culture (02/08/16): pan-sensitive E Coli Blood Cultures (02/08/16): 1/2 with pan-sensitive E Coli Blood Cultures (02/10/16): no growth after 5 days (final)  Component     Latest Ref Rng & Units 02/08/2016 02/08/2016 02/09/2016        11:50 AM 10:11 PM   Troponin I     <0.03 ng/mL 0.30 (HH) 0.64 (HH) 0.56 (HH)   Chest Radiograph AP (02/12/16) IMPRESSION: Stable cardiomegaly, small left greater than right effusions, and interstitial pulmonary edema.  CT Abdomen and Pelvis (02/13/16) IMPRESSION: No intra-abdominal hematoma.  Mildly distended gallbladder with small layering calculi. Ultrasound may provide better evaluation of the gallbladder if clinically indicated.  No bowel obstruction or active inflammation.  Moderate size pericardial effusion. Correlation with echocardiogram recommended.  Partially visualized moderate left pleural effusion with associated partial compressive atelectasis of the left lower lobe. Pneumonia is not excluded.  Echocardiogram (02/13/16) Study Conclusions  - Left ventricle: The cavity size was normal. There was mild   concentric hypertrophy. Systolic function was severely reduced.   The estimated ejection fraction was in the range of 20% to 25%.   Diffuse hypokinesis and basal and mid inferolateral and inferior   akinesis. - Aortic valve: There was mild stenosis. There was mild   regurgitation. Valve area (VTI): 0.6 cm^2. Valve area (Vmax):   0.62 cm^2. Valve area (Vmean): 0.57 cm^2. - Mitral valve: There was mild regurgitation. - Left atrium: The atrium was mildly dilated. - Right ventricle: Systolic function was severely reduced. - Tricuspid valve: There was  moderate regurgitation. - Pulmonary arteries: Systolic pressure was mildly increased. PA   peak pressure: 38 mm Hg (S). - Pericardium, extracardiac: A mild pericardial effusion  was   identified posterior to the heart. Features were not consistent   with tamponade physiology. There was a left pleural effusion.  Discharge Instructions: Discharge Instructions    Diet - low sodium heart healthy    Complete by:  As directed    Increase activity slowly    Complete by:  As directed       Signed: Velna Ochs, MD 02/20/2016, 7:01 PM   Pager: [phone]

## 2016-02-08 NOTE — ED Notes (Signed)
MD Wilson Singer made aware of INR result.

## 2016-02-09 DIAGNOSIS — N179 Acute kidney failure, unspecified: Secondary | ICD-10-CM

## 2016-02-09 DIAGNOSIS — R791 Abnormal coagulation profile: Secondary | ICD-10-CM | POA: Diagnosis present

## 2016-02-09 DIAGNOSIS — E860 Dehydration: Secondary | ICD-10-CM

## 2016-02-09 DIAGNOSIS — N185 Chronic kidney disease, stage 5: Secondary | ICD-10-CM

## 2016-02-09 DIAGNOSIS — A419 Sepsis, unspecified organism: Secondary | ICD-10-CM

## 2016-02-09 DIAGNOSIS — R6520 Severe sepsis without septic shock: Secondary | ICD-10-CM

## 2016-02-09 DIAGNOSIS — I482 Chronic atrial fibrillation: Secondary | ICD-10-CM

## 2016-02-09 DIAGNOSIS — N189 Chronic kidney disease, unspecified: Secondary | ICD-10-CM

## 2016-02-09 LAB — BLOOD CULTURE ID PANEL (REFLEXED)

## 2016-02-09 LAB — BASIC METABOLIC PANEL
Anion gap: 18 — ABNORMAL HIGH (ref 5–15)
BUN: 87 mg/dL — ABNORMAL HIGH (ref 6–20)
CO2: 33 mmol/L — ABNORMAL HIGH (ref 22–32)
Calcium: 7.7 mg/dL — ABNORMAL LOW (ref 8.9–10.3)
Chloride: 92 mmol/L — ABNORMAL LOW (ref 101–111)
Creatinine, Ser: 5.58 mg/dL — ABNORMAL HIGH (ref 0.44–1.00)
GFR calc Af Amer: 8 mL/min — ABNORMAL LOW (ref >60)
GFR calc non Af Amer: 7 mL/min — ABNORMAL LOW (ref >60)
Glucose, Bld: 49 mg/dL — ABNORMAL LOW (ref 65–99)
Potassium: 3.3 mmol/L — ABNORMAL LOW (ref 3.5–5.1)
Sodium: 143 mmol/L (ref 135–145)

## 2016-02-09 LAB — CBC
HCT: 34.6 % — ABNORMAL LOW (ref 36.0–46.0)
Hemoglobin: 9.7 g/dL — ABNORMAL LOW (ref 12.0–15.0)
MCH: 23.8 pg — ABNORMAL LOW (ref 26.0–34.0)
MCHC: 28 g/dL — ABNORMAL LOW (ref 30.0–36.0)
MCV: 85 fL (ref 78.0–100.0)
Platelets: 167 10*3/uL (ref 150–400)
RBC: 4.07 MIL/uL (ref 3.87–5.11)
RDW: 24 % — ABNORMAL HIGH (ref 11.5–15.5)
WBC: 16.8 10*3/uL — ABNORMAL HIGH (ref 4.0–10.5)

## 2016-02-09 LAB — GLUCOSE, CAPILLARY
GLUCOSE-CAPILLARY: 132 mg/dL — AB (ref 65–99)
GLUCOSE-CAPILLARY: 186 mg/dL — AB (ref 65–99)
GLUCOSE-CAPILLARY: 224 mg/dL — AB (ref 65–99)
Glucose-Capillary: 36 mg/dL — CL (ref 65–99)

## 2016-02-09 LAB — TROPONIN I: Troponin I: 0.56 ng/mL (ref <0.03)

## 2016-02-09 LAB — MAGNESIUM: Magnesium: 1.9 mg/dL (ref 1.7–2.4)

## 2016-02-09 LAB — PROTIME-INR
INR: >10
Prothrombin Time: >90 seconds — ABNORMAL HIGH (ref 11.4–15.2)

## 2016-02-09 MED ORDER — DEXTROSE 5 % IV SOLN
1.0000 g | Freq: Once | INTRAVENOUS | Status: AC
  Administered 2016-02-09: 1 g via INTRAVENOUS
  Filled 2016-02-09: qty 10

## 2016-02-09 MED ORDER — DEXTROSE 50 % IV SOLN
1.0000 | INTRAVENOUS | Status: DC | PRN
  Administered 2016-02-09: 50 mL via INTRAVENOUS

## 2016-02-09 MED ORDER — GLUCOSE 40 % PO GEL: 1.0000 | Freq: Once | ORAL | Status: DC

## 2016-02-09 MED ORDER — PHYTONADIONE 5 MG PO TABS
2.5000 mg | ORAL_TABLET | Freq: Once | ORAL | Status: AC
  Administered 2016-02-09: 2.5 mg via ORAL
  Filled 2016-02-09: qty 1

## 2016-02-09 MED ORDER — DEXTROSE 50 % IV SOLN
INTRAVENOUS | Status: AC
  Filled 2016-02-09: qty 50

## 2016-02-09 MED ORDER — DEXTROSE 5 % IV SOLN
2.0000 g | INTRAVENOUS | Status: DC
  Administered 2016-02-10: 2 g via INTRAVENOUS
  Filled 2016-02-09 (×3): qty 2

## 2016-02-09 MED ORDER — SODIUM CHLORIDE 0.9 % IV SOLN
INTRAVENOUS | Status: DC
  Administered 2016-02-09: 250 mL via INTRAVENOUS

## 2016-02-09 MED ORDER — METOPROLOL SUCCINATE ER 25 MG PO TB24
50.0000 mg | ORAL_TABLET | Freq: Every day | ORAL | Status: DC
  Administered 2016-02-10: 50 mg via ORAL
  Filled 2016-02-09: qty 2

## 2016-02-09 MED ORDER — POTASSIUM CHLORIDE CRYS ER 20 MEQ PO TBCR
40.0000 meq | EXTENDED_RELEASE_TABLET | Freq: Once | ORAL | Status: AC
  Administered 2016-02-09: 40 meq via ORAL
  Filled 2016-02-09: qty 2

## 2016-02-09 MED ORDER — POTASSIUM CHLORIDE CRYS ER 20 MEQ PO TBCR
20.0000 meq | EXTENDED_RELEASE_TABLET | Freq: Once | ORAL | Status: AC
  Administered 2016-02-09: 20 meq via ORAL
  Filled 2016-02-09: qty 1

## 2016-02-09 MED ORDER — FUROSEMIDE 40 MG PO TABS
40.0000 mg | ORAL_TABLET | Freq: Two times a day (BID) | ORAL | Status: DC
  Administered 2016-02-09 – 2016-02-12 (×7): 40 mg via ORAL
  Filled 2016-02-09 (×7): qty 1

## 2016-02-09 NOTE — Progress Notes (Signed)
ANTICOAGULATION CONSULT NOTE - Initial Consult  Pharmacy Consult for Coumadin Indication: atrial fibrillation  No Known Allergies  Patient Measurements: Weight: 170 lb 10.2 oz (77.4 kg)   Vital Signs: Temp: 97.8 F (36.6 C) (09/23 0800) Temp Source: Oral (09/23 0800) BP: 119/82 (09/23 0920) Pulse Rate: 108 (09/23 0920)  Labs:  Recent Labs  02/08/16 1150 02/08/16 2211 02/09/16 0507  HGB 9.3*  --  9.7*  HCT 32.8*  --  34.6*  PLT 174  --  167  LABPROT >90.0*  --  >90.0*  INR >10.00*  --  >10.00*  CREATININE 6.23*  --  5.58*  TROPONINI 0.30* 0.64* 0.56*    Estimated Creatinine Clearance: 8.7 mL/min (by C-G formula based on SCr of 5.58 mg/dL (H)).   Medical History: Past Medical History:  Diagnosis Date  . Anemia    b12 def, iron def, follow at cancer center, gets B12 and  another injection there. Could not remeber the name. Dr. Ralene Ok is her  cancer doctor  . Asthma   . Atrial fibrillation ([place])   . Blood transfusion    two or more yrs ago  . Chronic renal insufficiency   . Chronic venous insufficiency   . Diabetes mellitus   . Hyperlipidemia   . Hypertension   . Hypothyroidism    thyroid removed 4 or more yrs ago  . MGUS (monoclonal gammopathy of unknown significance)   . Osteomyelitis ([place])    s/p Rt 2nd toe and left 5 toes  amputation in 1/12  by Dr. Sharol Given  . Stroke ([place])   . Thyroid disease    hypothyroidism h/o hyperthyroidism s/p ablation/ectomy    Medications:  Scheduled:  . amiodarone  200 mg Oral BID  . atorvastatin  40 mg Oral Daily  . [START ON 02/11/2016] calcitRIOL  0.25 mcg Oral Q M,W,F  . cefTRIAXone (ROCEPHIN)  IV  1 g Intravenous Q24H  . dextrose  1 Tube Oral Once  . furosemide  40 mg Oral BID  . hydrocortisone  1 application Rectal BID  . levothyroxine  112 mcg Oral QAC breakfast  . metoprolol succinate  25 mg Oral Daily  . pantoprazole  40 mg Oral Daily  . sodium bicarbonate  1,300 mg Oral TID  . sodium chloride flush  3 mL  Intravenous Q12H    Assessment: 81yo female with AFib, admitted with supratherapeutic INR on home dose of 5mg  daily- last dose 9/21.  Pt started both Amiodarone and Coumadin on 9/8, with INR 2.9 on 9/13  She received a second dose of Vitamin K po this AM.  No bleeding noted.  Goal of Therapy:  INR 2-3 Monitor platelets by anticoagulation protocol: Yes   Plan:  Daily INR Start Coumadin when appropriate  Gracy Bruins, PharmD Clinical Pharmacist [facility]

## 2016-02-09 NOTE — Progress Notes (Signed)
02/09/16  1440  New order for Vit K 2.5mg  was ordered. Page Dr Edwena Blow to verify if Vit K should be given. Per MD okay to give Vit K 2.5mg . MD aware no new labs had been drawn since last dose of Vit K 2.5mg  at 0830 this morning.

## 2016-02-09 NOTE — Progress Notes (Signed)
  PHARMACY - PHYSICIAN COMMUNICATION CRITICAL VALUE ALERT - BLOOD CULTURE IDENTIFICATION (BCID)  Results for orders placed or performed during the hospital encounter of 02/08/16  Blood Culture ID Panel (Reflexed) (Collected: 02/08/2016 11:50 AM)  Result Value Ref Range   Enterococcus species NOT DETECTED NOT DETECTED   Listeria monocytogenes NOT DETECTED NOT DETECTED   Staphylococcus species NOT DETECTED NOT DETECTED   Staphylococcus aureus NOT DETECTED NOT DETECTED   Streptococcus species NOT DETECTED NOT DETECTED   Streptococcus agalactiae NOT DETECTED NOT DETECTED   Streptococcus pneumoniae NOT DETECTED NOT DETECTED   Streptococcus pyogenes NOT DETECTED NOT DETECTED   Acinetobacter baumannii NOT DETECTED NOT DETECTED   Enterobacteriaceae species DETECTED (A) NOT DETECTED   Enterobacter cloacae complex NOT DETECTED NOT DETECTED   Escherichia coli DETECTED (A) NOT DETECTED   Klebsiella oxytoca NOT DETECTED NOT DETECTED   Klebsiella pneumoniae NOT DETECTED NOT DETECTED   Proteus species NOT DETECTED NOT DETECTED   Serratia marcescens NOT DETECTED NOT DETECTED   Carbapenem resistance NOT DETECTED NOT DETECTED   Haemophilus influenzae NOT DETECTED NOT DETECTED   Neisseria meningitidis NOT DETECTED NOT DETECTED   Pseudomonas aeruginosa NOT DETECTED NOT DETECTED   Candida albicans NOT DETECTED NOT DETECTED   Candida glabrata NOT DETECTED NOT DETECTED   Candida krusei NOT DETECTED NOT DETECTED   Candida parapsilosis NOT DETECTED NOT DETECTED   Candida tropicalis NOT DETECTED NOT DETECTED    Name of physician (or Provider) Contacted: Paged O'Sullivan  Changes to prescribed antibiotics required: BCx 1/2 with GNR, BCID + for Ecoli. On appropriate therapy with CTX, will increase dose to 2g IV q24h for bacteremia per P&T protocol.  Elicia Lamp, PharmD, BCPS Clinical Pharmacist Pager [phone] 02/09/2016 7:32 PM

## 2016-02-09 NOTE — Progress Notes (Addendum)
CRITICAL VALUE ALERT  Critical value received: INR >10  Date of notification: 02/09/16  Time of notification: 0700  Critical value read back: Yes  Nurse who received alert: Sherryl Manges, RN  MD notified (1st page): Dr. Quay Burow  Time of first page: 0705  Responding MD: Dr. Quay Burow  Time MD responded: [phone]

## 2016-02-09 NOTE — Progress Notes (Signed)
Subjective: Feel more awake this morning.  No shortness of breath, chest pain, fevers, or chills.  Hungry and wants to eat breakfast.  Objective:  Vital signs in last 24 hours: Vitals:   02/08/16 2337 02/09/16 0441 02/09/16 0800 02/09/16 0920  BP: 129/75 124/83 126/71 119/82  Pulse: 92 90 93 (!) 108  Resp: 15 18 12 10   Temp: 98.2 F (36.8 C) 97.7 F (36.5 C) 97.8 F (36.6 C)   TempSrc: Oral Oral Oral   SpO2: 97% 91% 98% 100%  Weight:  170 lb 10.2 oz (77.4 kg)     Physical Exam  Constitutional: She is oriented to person, place, and time. She appears well-developed and well-nourished. No distress.  Eyes: Conjunctivae are normal. No scleral icterus.  Cardiovascular:  Tachycardic, irregularly irregular rhythm, normal [id number], 2/6 systolic murmur  Pulmonary/Chest:  Normal work of breathing, good air movement, bibasilar crackles  Abdominal: Soft. She exhibits no distension. There is no tenderness.  Musculoskeletal: She exhibits no edema or tenderness.  Neurological: She is alert and oriented to person, place, and time.  Skin: Skin is warm and dry.  Psychiatric: She has a normal mood and affect. Her behavior is normal.   CBC Latest Ref Rng & Units 02/09/2016 02/08/2016 01/29/2016  WBC 4.0 - 10.5 K/uL 16.8(H) 18.6(H) 6.0  Hemoglobin 12.0 - 15.0 g/dL 9.7(L) 9.3(L) 8.9(A)  Hematocrit 36.0 - 46.0 % 34.6(L) 32.8(L) 29(A)  Platelets 150 - 400 K/uL 167 174 135(A)   BMP Latest Ref Rng & Units 02/09/2016 02/08/2016 01/29/2016  Glucose 65 - 99 mg/dL 49(L) 87 -  BUN 6 - 20 mg/dL 87(H) 92(H) 87(A)  Creatinine 0.44 - 1.00 mg/dL 5.58(H) 6.23(H) 5.9(A)  BUN/Creat Ratio 11 - 26 - - -  Sodium 135 - 145 mmol/L 143 140 140  Potassium 3.5 - 5.1 mmol/L 3.3(L) 3.1(L) 3.7  Chloride 101 - 111 mmol/L 92(L) 89(L) -  CO2 22 - 32 mmol/L 33(H) 36(H) -  Calcium 8.9 - 10.3 mg/dL 7.7(L) 7.4(L) -   Component     Latest Ref Rng & Units 02/08/2016 02/09/2016  Prothrombin Time     11.4 - 15.2 seconds >90.0 (H)  >90.0 (H)  INR      >10.00 (HH) >10.00 (HH)   Cardiac Panel (last 3 results)  Recent Labs  02/08/16 1150 02/08/16 2211 02/09/16 0507  TROPONINI 0.30* 0.64* 0.56*    Assessment/Plan:  Principal Problem:   UTI (lower urinary tract infection) Active Problems:   Hypothyroidism   Hyperlipidemia   CKD (chronic kidney disease), stage V (HCC)   Atrial fibrillation (HCC)   Anemia associated with chronic renal failure   External hemorrhoids  #UTI Afebrile, leukocytosis decreasing, hemodynamically stable.  Mental status improved, more alert. -Continue Ceftriaxone -Daily CBC -F/u urine and blood cultures  #Elevated Troponins She has no chest pain or dyspnea, but has elevated troponins and possible new ischemic EKG changes.  Her elevated cardiac markers likely represent demand ischemia in the setting of infection.  She is currently anticoagulated with supratherapeutic INR, so unless STEMI emerges no further treatment would be available even if occlusive disease. Troponins peaked overnight and started decreasing. -repeat EKG  #CHF Volume status challenging to assess. Pulmonary but no peripheral edema, hypotensive at presentation but now normotensive after 2L and antibiotics.  Good oxygenation on room air.  Volume status is precarious with CHF, CKD, and concern for sepsis. -Resume diuretic regimen at lower dose with 40 mg PO lasix BID -Fluid restrict 1200 mL -I/Os and daily  weights  #Acute on Chronic Kidney Disease Not a dialysis candidate.  Creatinine downtrending.  Likely prerenal secondary to dehydration and infection. -Daily BMPs -I/Os and daily weights -continue home calcimimetic, PO bicarb  #AF with RVR Rates moderately well controlled 100-110. -Telemetry -Amiodarone 200 mg BID -Reduced metoprolol to 25 mg daily (from 75 mg daily)  #Elevated Troponins She has no chest pain or dyspnea, but was found ot have elevated troponins and possible new ischemic EKG changes.   Her elevated cardiac markers could represent NSTEMI, demand ischemia, or be artifactual due to her CKD.  She is currently anticoagulated with supratherapeutic INR, so unless STEMI emerges no further treatment would be available. -Repeat EKG  #Supratherapeutic INR On warfarin for AF stroke prophylaxis, INR goal 2-3, currently supratherapeutic with INR >10.  Given 2x 2.5mg  PO Vit K -Pharmacy consult for anticoagution  #Hypokalemia -Oral repletion -Follow BMP   Dispo: Anticipated discharge in approximately 2 day(s).   Minus Liberty, MD 02/09/2016, 11:39 AM Pager: [phone]

## 2016-02-10 LAB — BASIC METABOLIC PANEL
ANION GAP: 15 (ref 5–15)
BUN: 89 mg/dL — ABNORMAL HIGH (ref 6–20)
CALCIUM: 7.9 mg/dL — AB (ref 8.9–10.3)
CO2: 35 mmol/L — AB (ref 22–32)
CREATININE: 5.57 mg/dL — AB (ref 0.44–1.00)
Chloride: 92 mmol/L — ABNORMAL LOW (ref 101–111)
GFR, EST AFRICAN AMERICAN: 8 mL/min — AB (ref >60)
GFR, EST NON AFRICAN AMERICAN: 7 mL/min — AB (ref >60)
Glucose, Bld: 142 mg/dL — ABNORMAL HIGH (ref 65–99)
Potassium: 3.8 mmol/L (ref 3.5–5.1)
SODIUM: 142 mmol/L (ref 135–145)

## 2016-02-10 LAB — CBC
HCT: 34 % — ABNORMAL LOW (ref 36.0–46.0)
Hemoglobin: 9.5 g/dL — ABNORMAL LOW (ref 12.0–15.0)
MCH: 23.9 pg — ABNORMAL LOW (ref 26.0–34.0)
MCHC: 27.9 g/dL — AB (ref 30.0–36.0)
MCV: 85.6 fL (ref 78.0–100.0)
PLATELETS: 160 10*3/uL (ref 150–400)
RBC: 3.97 MIL/uL (ref 3.87–5.11)
RDW: 23.9 % — AB (ref 11.5–15.5)
WBC: 12.4 10*3/uL — AB (ref 4.0–10.5)

## 2016-02-10 LAB — URINE CULTURE: Culture: >=100000 — AB

## 2016-02-10 LAB — PROTIME-INR: Prothrombin Time: >90 seconds — ABNORMAL HIGH (ref 11.4–15.2)

## 2016-02-10 LAB — SURGICAL PCR SCREEN
MRSA, PCR: NEGATIVE
STAPHYLOCOCCUS AUREUS: NEGATIVE

## 2016-02-10 LAB — GLUCOSE, CAPILLARY
GLUCOSE-CAPILLARY: 115 mg/dL — AB (ref 65–99)
GLUCOSE-CAPILLARY: 167 mg/dL — AB (ref 65–99)
Glucose-Capillary: 157 mg/dL — ABNORMAL HIGH (ref 65–99)

## 2016-02-10 MED ORDER — PHYTONADIONE 5 MG PO TABS
5.0000 mg | ORAL_TABLET | Freq: Once | ORAL | Status: AC
  Administered 2016-02-10: 5 mg via ORAL
  Filled 2016-02-10: qty 1

## 2016-02-10 MED ORDER — METOPROLOL SUCCINATE ER 50 MG PO TB24: 75.0000 mg | ORAL_TABLET | Freq: Every day | ORAL | Status: DC

## 2016-02-10 NOTE — Progress Notes (Signed)
ANTICOAGULATION CONSULT NOTE - Follow Up Consult  Pharmacy Consult for Coumadin Indication: atrial fibrillation  No Known Allergies  Patient Measurements: Weight: 170 lb 10.2 oz (77.4 kg)   Vital Signs: Temp: 95.5 F (35.3 C) (09/24 0400) Temp Source: Oral (09/24 0400) BP: 116/69 (09/24 0400) Pulse Rate: 101 (09/24 0400)  Labs:  Recent Labs  02/08/16 1150 02/08/16 2211 02/09/16 0507 02/10/16 0417  HGB 9.3*  --  9.7* 9.5*  HCT 32.8*  --  34.6* 34.0*  PLT 174  --  167 160  LABPROT >90.0*  --  >90.0* >90.0*  INR >10.00*  --  >10.00* >10.00*  CREATININE 6.23*  --  5.58* 5.57*  TROPONINI 0.30* 0.64* 0.56*  --     Estimated Creatinine Clearance: 8.7 mL/min (by C-G formula based on SCr of 5.57 mg/dL (H)).   Medications:  Scheduled:  . amiodarone  200 mg Oral BID  . atorvastatin  40 mg Oral Daily  . [START ON 02/11/2016] calcitRIOL  0.25 mcg Oral Q M,W,F  . cefTRIAXone (ROCEPHIN)  IV  2 g Intravenous Q24H  . dextrose  1 Tube Oral Once  . furosemide  40 mg Oral BID  . hydrocortisone  1 application Rectal BID  . levothyroxine  112 mcg Oral QAC breakfast  . metoprolol succinate  50 mg Oral Daily  . pantoprazole  40 mg Oral Daily  . phytonadione  5 mg Oral Once  . sodium bicarbonate  1,300 mg Oral TID  . sodium chloride flush  3 mL Intravenous Q12H    Assessment: 81yo female with AFib and supratherapeutic INR.  INR remains > 10 despite VitK 2.5mg  PO x 3, additional 5mg  PO has been ordered this AM.  No bleeding.  Hg is stable and pltc wnl.  Goal of Therapy:  INR 2-3 Monitor platelets by anticoagulation protocol: Yes   Plan:  Daily INR Watch for bleeding   Gracy Bruins, PharmD [facility]

## 2016-02-10 NOTE — Progress Notes (Signed)
   Subjective: Patient was seen and examined this morning. She denies chest pain, shortness of breath, nausea or vomiting. She does admit to urinary frequency.   Objective: Vital signs in last 24 hours: Vitals:   02/10/16 0400 02/10/16 0600 02/10/16 0700 02/10/16 0800  BP: 116/69 115/83  118/72  Pulse: (!) 101 99 98 (!) 103  Resp: 14 (!) 9 10 (!) 9  Temp: (!) 95.5 F (35.3 C)   97.5 F (36.4 C)  TempSrc: Oral   Oral  SpO2: 98% 100% 100% 94%  Weight:       Physical Exam General: Vital signs reviewed.  Patient is elderly, chronically ill appearing, in no acute distress and cooperative with exam.  Cardiovascular: Irregularly irregular, rate controlled Pulmonary/Chest: Clear to auscultation bilaterally Abdominal: Soft, non-tender, non-distended, BS + Extremities: No lower extremity edema bilaterally  Assessment/Plan: Principal Problem:   UTI (lower urinary tract infection) Active Problems:   Hypothyroidism   Hyperlipidemia   CKD (chronic kidney disease), stage V (HCC)   Atrial fibrillation (HCC)   Anemia associated with chronic renal failure   External hemorrhoids   Acute kidney injury superimposed on chronic kidney disease (HCC)   Dehydration   Sepsis ([place])   Supratherapeutic INR  [name] is a 81 year old woman with history of HFrEF, AFib with RVR, CKD4, and DM2 who presents from a SNF with 1 day of somnolence and reduced oral intake.  E. Coli Bacteremia 2/2 E. Coli UTI: E. Coli UTI is pan-sensitive. BCx sensitivities are pending. Patient is improving clinically- afebrile, leukocytosis trending down from 18>16>12. Mental status is at baseline.  -Continue Ceftriaxone -Awaiting BCx sensitivities -BCx 9/22 >> E. Coli -UCx 9/22> E. Coli -BCx 9/24 >> pending -Repeat CBC/BMET tomorrow am -Will need PICC once BCx negative  Supratherapeutic INR: Patient is on coumadin for her atrial fibrillation with RVR. Her INR remains >10 this morning despite Vitamin K 2.5 on 9/22 and 5 mg  on 9/23.  -Hold coumadin -Daily INR -Vitamin K 5 mg once 9/24  Chronic HFrEF: Euvolemic. Weight is below baseline (200 on discharge and 170 today?).  -On Lasix 40 mg BID (reduced home dose, will increase as needed) -Metoprolol 75 mg QD -Fluid restrict 1200 mL -I/Os and daily weights  Chronic Kidney Disease Stage V: Creatine now back at her pre-admission baseline of 5.5 after IVF. Patient is not a dialysis candidate.  -Daily BMPs -I/Os and daily weights -Continue home calcimimetic, PO bicarb  Chronic Atrial Fibrillation: Rates moderately well controlled 100-110. Normotensive.  -Telemetry -Amiodarone 200 mg BID -Increase metoprolol to 75 mg daily   DVT/PE ppx: SCDs, currently supratherapeutic CODE: FULL FEN: Renal  Dispo: Anticipated discharge in approximately 3 day(s).   LOS: 2 days   Martyn Malay, DO PGY-3 Internal Medicine Resident Pager # [phone] 02/10/2016 11:55 AM

## 2016-02-10 NOTE — Progress Notes (Signed)
CRITICAL VALUE ALERT  Critical value received: INR >10  Date of notification:  02/10/16  Time of notification:  0615  Critical value read back:yes  Nurse who received alert:  Val Riles, RN  MD notified (1st page):  Family Medicine  Time of first page:  0630  MD notified (2nd page):  Time of second page:  Responding MD:  Family Medicine responded with order.  Time MD responded:  [phone]

## 2016-02-11 DIAGNOSIS — E876 Hypokalemia: Secondary | ICD-10-CM

## 2016-02-11 DIAGNOSIS — E1122 Type 2 diabetes mellitus with diabetic chronic kidney disease: Secondary | ICD-10-CM

## 2016-02-11 DIAGNOSIS — Z7984 Long term (current) use of oral hypoglycemic drugs: Secondary | ICD-10-CM

## 2016-02-11 DIAGNOSIS — Z7901 Long term (current) use of anticoagulants: Secondary | ICD-10-CM

## 2016-02-11 DIAGNOSIS — R778 Other specified abnormalities of plasma proteins: Secondary | ICD-10-CM

## 2016-02-11 DIAGNOSIS — I509 Heart failure, unspecified: Secondary | ICD-10-CM

## 2016-02-11 DIAGNOSIS — A4151 Sepsis due to Escherichia coli [E. coli]: Secondary | ICD-10-CM

## 2016-02-11 LAB — BASIC METABOLIC PANEL
Anion gap: 14 (ref 5–15)
BUN: 87 mg/dL — AB (ref 6–20)
CHLORIDE: 91 mmol/L — AB (ref 101–111)
CO2: 37 mmol/L — ABNORMAL HIGH (ref 22–32)
CREATININE: 5.56 mg/dL — AB (ref 0.44–1.00)
Calcium: 7.7 mg/dL — ABNORMAL LOW (ref 8.9–10.3)
GFR calc Af Amer: 8 mL/min — ABNORMAL LOW (ref >60)
GFR calc non Af Amer: 7 mL/min — ABNORMAL LOW (ref >60)
GLUCOSE: 114 mg/dL — AB (ref 65–99)
POTASSIUM: 3.3 mmol/L — AB (ref 3.5–5.1)
Sodium: 142 mmol/L (ref 135–145)

## 2016-02-11 LAB — CBC
HCT: 33.3 % — ABNORMAL LOW (ref 36.0–46.0)
HEMOGLOBIN: 9.5 g/dL — AB (ref 12.0–15.0)
MCH: 24.5 pg — AB (ref 26.0–34.0)
MCHC: 28.5 g/dL — AB (ref 30.0–36.0)
MCV: 86 fL (ref 78.0–100.0)
Platelets: 149 10*3/uL — ABNORMAL LOW (ref 150–400)
RBC: 3.87 MIL/uL (ref 3.87–5.11)
RDW: 23.5 % — ABNORMAL HIGH (ref 11.5–15.5)
WBC: 9.3 10*3/uL (ref 4.0–10.5)

## 2016-02-11 LAB — CULTURE, BLOOD (ROUTINE X 2)

## 2016-02-11 LAB — GLUCOSE, CAPILLARY: Glucose-Capillary: 163 mg/dL — ABNORMAL HIGH (ref 65–99)

## 2016-02-11 LAB — MAGNESIUM: Magnesium: 2 mg/dL (ref 1.7–2.4)

## 2016-02-11 LAB — PROTIME-INR
INR: 8.76
Prothrombin Time: 74.9 seconds — ABNORMAL HIGH (ref 11.4–15.2)

## 2016-02-11 MED ORDER — POTASSIUM CHLORIDE CRYS ER 20 MEQ PO TBCR
40.0000 meq | EXTENDED_RELEASE_TABLET | Freq: Once | ORAL | Status: AC
  Administered 2016-02-11: 40 meq via ORAL
  Filled 2016-02-11: qty 2

## 2016-02-11 MED ORDER — CEPHALEXIN 250 MG PO CAPS
250.0000 mg | ORAL_CAPSULE | Freq: Every day | ORAL | Status: DC
  Administered 2016-02-11 – 2016-02-12 (×2): 250 mg via ORAL
  Filled 2016-02-11 (×2): qty 1

## 2016-02-11 MED ORDER — PHYTONADIONE 5 MG PO TABS
2.5000 mg | ORAL_TABLET | Freq: Once | ORAL | Status: AC
  Administered 2016-02-11: 2.5 mg via ORAL
  Filled 2016-02-11: qty 1

## 2016-02-11 MED ORDER — METOPROLOL SUCCINATE ER 50 MG PO TB24
50.0000 mg | ORAL_TABLET | Freq: Every day | ORAL | Status: DC
  Administered 2016-02-12: 50 mg via ORAL
  Filled 2016-02-11 (×2): qty 1

## 2016-02-11 NOTE — Care Management Important Message (Signed)
Important Message  Patient Details  Name: [name] MRN: [id number] Date of Birth: March 29, 1935   Medicare Important Message Given:  Yes    Quintavia Rogstad 02/11/2016, 1:35 PM

## 2016-02-11 NOTE — Progress Notes (Signed)
CRITICAL VALUE ALERT  Critical value received: 0723  Date of notification:  9/25  Time of notification:  0723  Critical value read back yes  Nurse who received alert: Ludwig Clarks  MD notified (1st page):  Dr. Quay Burow  Time MD responded:  [phone]

## 2016-02-11 NOTE — Progress Notes (Signed)
ANTICOAGULATION CONSULT NOTE  Pharmacy Consult for Coumadin Indication: atrial fibrillation  No Known Allergies  Patient Measurements: Height: 5\' 7"  (170.2 cm) Weight: 170 lb 13.7 oz (77.5 kg) IBW/kg (Calculated) : 61.6   Vital Signs: Temp: 97.8 F (36.6 C) (09/25 0439) Temp Source: Oral (09/25 0439) BP: 84/65 (09/25 0439) Pulse Rate: 86 (09/25 0439)  Labs:  Recent Labs  02/08/16 1150 02/08/16 2211 02/09/16 0507 02/10/16 0417 02/11/16 0555  HGB 9.3*  --  9.7* 9.5* 9.5*  HCT 32.8*  --  34.6* 34.0* 33.3*  PLT 174  --  167 160 149*  LABPROT >90.0*  --  >90.0* >90.0* 74.9*  INR >10.00*  --  >10.00* >10.00* 8.76*  CREATININE 6.23*  --  5.58* 5.57* 5.56*  TROPONINI 0.30* 0.64* 0.56*  --   --     Estimated Creatinine Clearance: 9 mL/min (by C-G formula based on SCr of 5.56 mg/dL (H)).   Medications:  Scheduled:  . amiodarone  200 mg Oral BID  . atorvastatin  40 mg Oral Daily  . calcitRIOL  0.25 mcg Oral Q M,W,F  . cephALEXin  250 mg Oral Daily  . dextrose  1 Tube Oral Once  . furosemide  40 mg Oral BID  . hydrocortisone  1 application Rectal BID  . levothyroxine  112 mcg Oral QAC breakfast  . metoprolol succinate  50 mg Oral Daily  . pantoprazole  40 mg Oral Daily  . potassium chloride  40 mEq Oral Once  . sodium bicarbonate  1,300 mg Oral TID  . sodium chloride flush  3 mL Intravenous Q12H    Assessment: 81 yo female with AFib and supratherapeutic INR. INR is down to 8.7, despite multiple doses of Vitamin K. No bleeding. Hg is stable and pltc wnl. No s/sx bleeding.  Goal of Therapy:  INR 2-3 Monitor platelets by anticoagulation protocol: Yes   Plan:  Daily INR Watch for bleeding      Hughes Better, PharmD, BCPS Clinical Pharmacist 02/11/2016 9:43 AM

## 2016-02-11 NOTE — Progress Notes (Signed)
Subjective: [name] is feeling well, with no fevers, chills, shortness of breath, or chest pain.  Objective:  Vital signs in last 24 hours: Vitals:   02/10/16 1200 02/10/16 1455 02/10/16 2218 02/11/16 0439  BP: 122/77 114/67 (!) 110/59 (!) 84/65  Pulse: (!) 104 (!) 102 100 86  Resp: 19 18 18 18   Temp: 97.7 F (36.5 C) 97.9 F (36.6 C) 98.2 F (36.8 C) 97.8 F (36.6 C)  TempSrc: Oral Oral Oral Oral  SpO2: 91% 91% 96% 100%  Weight:  170 lb 13.7 oz (77.5 kg)    Height:  5\' 7"  (1.702 m)     Physical Exam  Constitutional: She is oriented to person, place, and time.  Frail, elderly woman in no distress, sitting in bed eating breakfast  Eyes: Conjunctivae are normal. No scleral icterus.  Cardiovascular:  Tachycardic, irregularly irregular rhythm, normal [id number], 2/6 systolic murmur  Pulmonary/Chest:  Normal work of breathing, good air movement, trace bibasilar crackles  Abdominal: Soft. She exhibits no distension. There is no tenderness.  Musculoskeletal: She exhibits no edema or tenderness.  Neurological: She is alert and oriented to person, place, and time.  Skin: Skin is warm and dry.  Psychiatric: She has a normal mood and affect. Her behavior is normal.   CBC Latest Ref Rng & Units 02/11/2016 02/10/2016 02/09/2016  WBC 4.0 - 10.5 K/uL 9.3 12.4(H) 16.8(H)  Hemoglobin 12.0 - 15.0 g/dL 9.5(L) 9.5(L) 9.7(L)  Hematocrit 36.0 - 46.0 % 33.3(L) 34.0(L) 34.6(L)  Platelets 150 - 400 K/uL 149(L) 160 167   BMP Latest Ref Rng & Units 02/11/2016 02/10/2016 02/09/2016  Glucose 65 - 99 mg/dL 114(H) 142(H) 49(L)  BUN 6 - 20 mg/dL 87(H) 89(H) 87(H)  Creatinine 0.44 - 1.00 mg/dL 5.56(H) 5.57(H) 5.58(H)  BUN/Creat Ratio 11 - 26 - - -  Sodium 135 - 145 mmol/L 142 142 143  Potassium 3.5 - 5.1 mmol/L 3.3(L) 3.8 3.3(L)  Chloride 101 - 111 mmol/L 91(L) 92(L) 92(L)  CO2 22 - 32 mmol/L 37(H) 35(H) 33(H)  Calcium 8.9 - 10.3 mg/dL 7.7(L) 7.9(L) 7.7(L)   Urine Culture (02/08/2016): E Coli,  pan-sensitive  Blood Cultures (02/08/2016): 1/2 growing E Coli, pan-sensitive  Lab Results  Component Value Date   INR 8.76 ([facility]) 02/11/2016   INR >10.00 ([facility]) 02/10/2016   INR >10.00 ([place]) 02/09/2016    Assessment/Plan:  Principal Problem:   UTI (lower urinary tract infection) Active Problems:   Hypothyroidism   Hyperlipidemia   CKD (chronic kidney disease), stage V (HCC)   Atrial fibrillation (HCC)   Anemia associated with chronic renal failure   External hemorrhoids   Acute kidney injury superimposed on chronic kidney disease ([place])   Dehydration   Sepsis (HCC)   Supratherapeutic INR  #UTI #E Coli bacteremia Afebrile, leukocytosis resolved.  Mental status improved, more alert.  Urine culture and 1/2 blood cultures growing pan-sensitive E Coli.  Treated with IV antibiotics (zosyn, then ceftriaxone) for 3 days so far. -Switch to PO cephalexin for to complete 7 days ABx (last  for gram negative bacteremia and complicated cystitis (last 9/28)  #Supratherapeutic INR On warfarin for AF stroke prophylaxis, INR goal 2-3, presented supratherapeutic with INR >10.  Given total of 12.5 mg PO Vit K 9/22-9/23.  Today INR 8.76, still significantly supratherapeutic but trending in the right direction.  No active bleeding, but would like to see INR come down to 5-6 prior to discharge for patient safety with fall risk at rehab. -2.5 mg PO Vit K -  Pharmacy consult for anticoagulation  #AF with RVR Rates moderately well controlled 100-110.  Metoprolol decreased to 25 mg daily (from 75 mg at home) on admission with concern for sepsis, increased to 50 mg and she became mildly hypotensive. -Telemetry -Amiodarone 200 mg BID -Continue metoprolol 50 mg daily  #Hypokalemia Has required multiple rounds of PO KCl.  Mg 1.9 on admission. -40 PO KCl today -Check Mg  #CHF Pulmonary but no peripheral edema, hypotensive at presentation but now normotensive after 2L and antibiotics. Good oxygenation on  room air.  Volume status is precarious with CHF, CKD, and concern for sepsis. -Continue diuretic regimen at lower dose with 40 mg PO lasix BID -Fluid restrict 1200 mL -I/Os and daily weights  #Acute on Chronic Kidney Disease Not a dialysis candidate.  Creatinine downtrending.  Likely prerenal secondary to dehydration and infection. -Daily BMPs -I/Os and daily weights -continue home calcimimetic, PO bicarb  #Elevated Troponins Resolved.  She has no chest pain or dyspnea, but has elevated troponins and possible new ischemic EKG changes.  Her elevated cardiac markers likely represent demand ischemia in the setting of infection.  She is currently anticoagulated with supratherapeutic INR, so unless STEMI emerges no further treatment would be available even if occlusive disease.  Dispo: Anticipated discharge in approximately 2 day(s).   Minus Liberty, MD 02/11/2016, 6:50 AM Pager: [phone]

## 2016-02-11 NOTE — Evaluation (Signed)
Physical Therapy Evaluation Patient Details Name: [name] MRN: [id number] DOB: 1934/10/06 Today's Date: 02/11/2016   History of Present Illness  81 y.o. female admitted with UTI. PMH: CHF, AFib, DM, stroke, bilateral transtarsal amputations.  Clinical Impression  Pt admitted with above diagnosis. Pt currently with functional limitations due to the deficits listed below (see PT Problem List). Pt requiring mod assist with mobility at this time and unable to attempt ambulation due to fatigue. Pt will benefit from skilled PT to increase their independence and safety with mobility to allow discharge to SNF for further rehabilitation.        Follow Up Recommendations SNF;Supervision for mobility/OOB    Equipment Recommendations  None recommended by PT    Recommendations for Other Services       Precautions / Restrictions Precautions Precautions: Fall Precaution Comments: pt requests use of her shoes for standing/ambulation Restrictions Weight Bearing Restrictions: No      Mobility  Bed Mobility Overal bed mobility: Needs Assistance Bed Mobility: Supine to Sit     Supine to sit: Mod assist     General bed mobility comments: needing cues for sequence and physical assist at trunk and pelvis to come to sitting EOB. Pt moves very slowly but able to assist given time.   Transfers Overall transfer level: Needs assistance Equipment used: Rolling walker (2 wheeled) Transfers: Sit to/from Stand Sit to Stand: Mod assist Stand pivot transfers: Mod assist       General transfer comment: Transfers performed from bed and from chair. Multiple stands from chair performed including attempt X3 with 1 successful. having to stand at chair for getting cleaned for incontinence of bowels during transfer from bed to chair. Heavy cues for scooting forward in chair and leaning forward with standing.   Ambulation/Gait                Stairs            Wheelchair Mobility     Modified Rankin (Stroke Patients Only)       Balance Overall balance assessment: Needs assistance Sitting-balance support: Single extremity supported Sitting balance-Leahy Scale: Fair     Standing balance support: Bilateral upper extremity supported Standing balance-Leahy Scale: Poor Standing balance comment: using rw and physical assist.                              Pertinent Vitals/Pain Pain Assessment: No/denies pain    Home Living Family/patient expects to be discharged to:: Skilled nursing facility                      Prior Function Level of Independence: Needs assistance   Gait / Transfers Assistance Needed: reports using rollator for ambulation            Hand Dominance        Extremity/Trunk Assessment   Upper Extremity Assessment: Generalized weakness           Lower Extremity Assessment: Generalized weakness         Communication   Communication: No difficulties  Cognition Arousal/Alertness: Awake/alert Behavior During Therapy: WFL for tasks assessed/performed Overall Cognitive Status: Within Functional Limits for tasks assessed (slow response to questions)                      General Comments General comments (skin integrity, edema, etc.): Using patient's shoes for standing and transfers, requesting use of hospital  socks inside shoes.     Exercises     Assessment/Plan    PT Assessment Patient needs continued PT services  PT Problem List Decreased strength;Decreased range of motion;Decreased activity tolerance;Decreased balance;Decreased mobility;Decreased safety awareness          PT Treatment Interventions DME instruction;Gait training;Functional mobility training;Therapeutic activities;Therapeutic exercise;Balance training;Neuromuscular re-education;Patient/family education    PT Goals (Current goals can be found in the Care Plan section)  Acute Rehab PT Goals Patient Stated Goal: get her strength  back PT Goal Formulation: With patient Time For Goal Achievement: 02/25/16 Potential to Achieve Goals: Fair    Frequency Min 2X/week   Barriers to discharge        Co-evaluation               End of Session Equipment Utilized During Treatment: Gait belt Activity Tolerance: Patient limited by fatigue Patient left: in chair;with call bell/phone within reach Nurse Communication: Mobility status    Functional Assessment Tool Used: clinical judgment Functional Limitation: Mobility: Walking and moving around Mobility: Walking and Moving Around Current Status [phone]): At least 60 percent but less than 80 percent impaired, limited or restricted Mobility: Walking and Moving Around Goal Status [phone]): At least 40 percent but less than 60 percent impaired, limited or restricted    Time: [id number] PT Time Calculation (min) (ACUTE ONLY): 45 min   Charges:   PT Evaluation $PT Eval Moderate Complexity: 1 Procedure PT Treatments $Therapeutic Activity: 23-37 mins   PT G Codes:   PT G-Codes **NOT FOR INPATIENT CLASS** Functional Assessment Tool Used: clinical judgment Functional Limitation: Mobility: Walking and moving around Mobility: Walking and Moving Around Current Status [id number]): At least 60 percent but less than 80 percent impaired, limited or restricted Mobility: Walking and Moving Around Goal Status [phone]): At least 40 percent but less than 60 percent impaired, limited or restricted    Cassell Clement, PT, [place] Pager [phone] Office [phone]  02/11/2016, 4:09 PM

## 2016-02-12 ENCOUNTER — Inpatient Hospital Stay (HOSPITAL_COMMUNITY): Payer: Medicare Other

## 2016-02-12 DIAGNOSIS — R6520 Severe sepsis without septic shock: Secondary | ICD-10-CM

## 2016-02-12 DIAGNOSIS — A4151 Sepsis due to Escherichia coli [E. coli]: Principal | ICD-10-CM

## 2016-02-12 LAB — PROTIME-INR
INR: 8.17
Prothrombin Time: 70.9 seconds — ABNORMAL HIGH (ref 11.4–15.2)

## 2016-02-12 LAB — CBC WITH DIFFERENTIAL/PLATELET
BASOS PCT: 0 %
BASOS PCT: 0 %
Basophils Absolute: 0 10*3/uL (ref 0.0–0.1)
Basophils Absolute: 0 10*3/uL (ref 0.0–0.1)
EOS ABS: 0 10*3/uL (ref 0.0–0.7)
EOS PCT: 0 %
EOS PCT: 0 %
Eosinophils Absolute: 0 10*3/uL (ref 0.0–0.7)
HCT: 32.5 % — ABNORMAL LOW (ref 36.0–46.0)
HEMATOCRIT: 37.7 % (ref 36.0–46.0)
HEMOGLOBIN: 9.4 g/dL — AB (ref 12.0–15.0)
Hemoglobin: 10.6 g/dL — ABNORMAL LOW (ref 12.0–15.0)
LYMPHS ABS: 1.1 10*3/uL (ref 0.7–4.0)
LYMPHS PCT: 10 %
Lymphocytes Relative: 11 %
Lymphs Abs: 1.2 10*3/uL (ref 0.7–4.0)
MCH: 24.1 pg — AB (ref 26.0–34.0)
MCH: 24.9 pg — AB (ref 26.0–34.0)
MCHC: 28.1 g/dL — ABNORMAL LOW (ref 30.0–36.0)
MCHC: 28.9 g/dL — AB (ref 30.0–36.0)
MCV: 85.9 fL (ref 78.0–100.0)
MCV: 86.2 fL (ref 78.0–100.0)
MONO ABS: 0.3 10*3/uL (ref 0.1–1.0)
MONOS PCT: 3 %
Monocytes Absolute: 0.6 10*3/uL (ref 0.1–1.0)
Monocytes Relative: 5 %
NEUTROS ABS: 9 10*3/uL — AB (ref 1.7–7.7)
NEUTROS ABS: 9.7 10*3/uL — AB (ref 1.7–7.7)
NEUTROS PCT: 85 %
Neutrophils Relative %: 86 %
PLATELETS: 161 10*3/uL (ref 150–400)
Platelets: 137 10*3/uL — ABNORMAL LOW (ref 150–400)
RBC: 4.39 MIL/uL (ref 3.87–5.11)
RBC: 3.77 MIL/uL — ABNORMAL LOW (ref 3.87–5.11)
RDW: 23.5 % — AB (ref 11.5–15.5)
RDW: 23.4 % — ABNORMAL HIGH (ref 11.5–15.5)
WBC: 10.4 10*3/uL (ref 4.0–10.5)
WBC: 11.5 10*3/uL — ABNORMAL HIGH (ref 4.0–10.5)

## 2016-02-12 LAB — LACTIC ACID, PLASMA
LACTIC ACID, VENOUS: 1.7 mmol/L (ref 0.5–1.9)
Lactic Acid, Venous: 1.9 mmol/L (ref 0.5–1.9)

## 2016-02-12 LAB — CBC
HCT: 30.6 % — ABNORMAL LOW (ref 36.0–46.0)
HEMOGLOBIN: 8.7 g/dL — AB (ref 12.0–15.0)
MCH: 24.5 pg — AB (ref 26.0–34.0)
MCHC: 28.4 g/dL — ABNORMAL LOW (ref 30.0–36.0)
MCV: 86.2 fL (ref 78.0–100.0)
PLATELETS: 119 10*3/uL — AB (ref 150–400)
RBC: 3.55 MIL/uL — AB (ref 3.87–5.11)
RDW: 23.5 % — ABNORMAL HIGH (ref 11.5–15.5)
WBC: 10.8 10*3/uL — AB (ref 4.0–10.5)

## 2016-02-12 LAB — BASIC METABOLIC PANEL
Anion gap: 17 — ABNORMAL HIGH (ref 5–15)
BUN: 87 mg/dL — AB (ref 6–20)
CALCIUM: 8.2 mg/dL — AB (ref 8.9–10.3)
CO2: 36 mmol/L — AB (ref 22–32)
Chloride: 89 mmol/L — ABNORMAL LOW (ref 101–111)
Creatinine, Ser: 5.76 mg/dL — ABNORMAL HIGH (ref 0.44–1.00)
GFR calc Af Amer: 7 mL/min — ABNORMAL LOW (ref >60)
GFR, EST NON AFRICAN AMERICAN: 6 mL/min — AB (ref >60)
GLUCOSE: 145 mg/dL — AB (ref 65–99)
POTASSIUM: 4.1 mmol/L (ref 3.5–5.1)
Sodium: 142 mmol/L (ref 135–145)

## 2016-02-12 LAB — TROPONIN I: Troponin I: 0.24 ng/mL (ref <0.03)

## 2016-02-12 MED ORDER — SODIUM CHLORIDE 0.9 % IV BOLUS (SEPSIS)
500.0000 mL | Freq: Once | INTRAVENOUS | Status: AC
  Administered 2016-02-12: 500 mL via INTRAVENOUS

## 2016-02-12 MED ORDER — GUAIFENESIN ER 600 MG PO TB12
600.0000 mg | ORAL_TABLET | Freq: Two times a day (BID) | ORAL | Status: DC
  Administered 2016-02-12 – 2016-02-19 (×15): 600 mg via ORAL
  Filled 2016-02-12 (×15): qty 1

## 2016-02-12 MED ORDER — INSULIN ASPART 100 UNIT/ML ~~LOC~~ SOLN
0.0000 [IU] | Freq: Three times a day (TID) | SUBCUTANEOUS | Status: DC
  Administered 2016-02-13: 1 [IU] via SUBCUTANEOUS
  Administered 2016-02-13: 3 [IU] via SUBCUTANEOUS
  Administered 2016-02-13: 1 [IU] via SUBCUTANEOUS
  Administered 2016-02-14: 2 [IU] via SUBCUTANEOUS
  Administered 2016-02-15 – 2016-02-16 (×2): 1 [IU] via SUBCUTANEOUS
  Administered 2016-02-16 – 2016-02-18 (×2): 2 [IU] via SUBCUTANEOUS
  Administered 2016-02-18: 1 [IU] via SUBCUTANEOUS

## 2016-02-12 MED ORDER — DEXTROSE 5 % IV SOLN
2.0000 g | INTRAVENOUS | Status: DC
  Administered 2016-02-12 – 2016-02-17 (×6): 2 g via INTRAVENOUS
  Filled 2016-02-12 (×7): qty 2

## 2016-02-12 MED ORDER — ONDANSETRON HCL 4 MG PO TABS
8.0000 mg | ORAL_TABLET | Freq: Three times a day (TID) | ORAL | Status: DC | PRN
  Administered 2016-02-12 – 2016-02-18 (×2): 8 mg via ORAL
  Filled 2016-02-12 (×2): qty 2

## 2016-02-12 MED ORDER — SODIUM CHLORIDE 0.9 % IV SOLN
INTRAVENOUS | Status: AC
  Administered 2016-02-12: 20:00:00 via INTRAVENOUS

## 2016-02-12 MED ORDER — ASPIRIN 325 MG PO TABS
325.0000 mg | ORAL_TABLET | Freq: Every day | ORAL | Status: DC
  Administered 2016-02-12: 325 mg via ORAL
  Filled 2016-02-12: qty 1

## 2016-02-12 MED ORDER — SODIUM CHLORIDE 0.9 % IV BOLUS (SEPSIS)
1000.0000 mL | Freq: Once | INTRAVENOUS | Status: AC
  Administered 2016-02-12: 1000 mL via INTRAVENOUS

## 2016-02-12 MED ORDER — PHYTONADIONE 5 MG PO TABS
2.5000 mg | ORAL_TABLET | Freq: Once | ORAL | Status: AC
  Administered 2016-02-12: 2.5 mg via ORAL
  Filled 2016-02-12: qty 1

## 2016-02-12 NOTE — Progress Notes (Signed)
   02/12/16 1407  Vitals  Temp 97.3 F (36.3 C)  Temp Source Oral  BP (!) 71/42  BP Location Left Arm  BP Method Automatic  Patient Position (if appropriate) Lying  Pulse Rate (!) 112  Pulse Rate Source Dinamap  Resp 18  Oxygen Therapy  SpO2 99 %  O2 Device Nasal Cannula  O2 Flow Rate (L/min) 2 L/min   Dr. Inda Castle notified of VS

## 2016-02-12 NOTE — Progress Notes (Signed)
Interim Progress Note  This afternoon, we were called to the patient's room due to complaint of right shoulder pain associated with shortness of breath and lightheadedness. Patient's HR was slightly higher than normal in the 110s-120s, previously in the 80-100s, afib. Her pressures, previously normotensive are now hypotensive in the 70/50s.   Etiology is unclear- hemoglobin is similar to priors, no evidence of bleeding despite supratherapeutic INR. EKG is similar to prior EKG on admission. Troponin elevated at 0.2, but decreased from priors earlier on admission. On exam, patient appears to be dry and warm- no evidence of acute decompensated heart failure or low output heart failure. She appears dry on exam overall. Lactic acid normal. WBC increased from yesterday after switching from Ceftriaxone to Keflex. Hypotension may be due to newly introduced metoprolol from 2 days ago. CXR with small effusion  Assessment: Persistent Hypotension  DDx: Hypovolemia, BB effect, Bacteremia/Sepsis, Cardiogenic Shock (less likely)  Plan: -Transfer to SDU -Switch to Ceftriaxone -NS 1 L bolus (total of 1.5 L thus far) -NS 125 cc/hr -D/C metoprolol -D/C lasix -Place foley -If persistent hypotension despite adequate fluid resuscitation, will discuss transfer to ICU with PCCM for pressors. Re-discussed code status with family and they continue to want FULL CODE for [name], DO PGY-3 Internal Medicine Resident Pager # [phone] 02/12/2016 7:37 PM

## 2016-02-12 NOTE — Progress Notes (Signed)
   02/12/16 1306  Vitals  BP 98/73  MAP (mmHg) 79  BP Location Left Arm  BP Method Automatic  Patient Position (if appropriate) Lying  Pulse Rate (!) 115  Oxygen Therapy  SpO2 100 %  O2 Device Nasal Cannula  O2 Flow Rate (L/min) 2 L/min    Patient complain of chest pain. Dr. Jenetta Downer' Conley Canal notifed and orders received

## 2016-02-12 NOTE — Progress Notes (Signed)
Subjective: Felt cold this morning when getting changed, but otherwise no chills.  No chest pain or dyspnea.  Admits to poor appetite recently, but tries to force herself to eat in order to get stronger.  When told she was making encouraging progress towards getting back to her rehab facility, she mentioned that she didn't think she was being changed and repositioned overnight properly at [facility].  Objective:  Vital signs in last 24 hours: Vitals:   02/11/16 1450 02/11/16 2130 02/12/16 0451 02/12/16 0500  BP: 106/63 117/74 120/68   Pulse: 82 (!) 102 (!) 104   Resp: 18 16 17    Temp: 98.1 F (36.7 C) 98.1 F (36.7 C) 99 F (37.2 C)   TempSrc: Oral Oral Oral   SpO2: 98% 98% 99%   Weight:    168 lb 3.4 oz (76.3 kg)  Height:       Physical Exam  Constitutional: She is oriented to person, place, and time.  Frail, elderly woman in no distress, sitting in bed eating breakfast  Eyes: Conjunctivae are normal. No scleral icterus.  Cardiovascular:  Tachycardic, irregularly irregular rhythm, normal [id number], 2/6 systolic murmur  Pulmonary/Chest:  Normal work of breathing, good air movement, trace bibasilar crackles  Abdominal: Soft. She exhibits no distension. There is no tenderness.  Musculoskeletal: She exhibits no edema or tenderness.  Neurological: She is alert and oriented to person, place, and time.  Skin: Skin is warm and dry.  Psychiatric: She has a normal mood and affect. Her behavior is normal.   CBC Latest Ref Rng & Units 02/12/2016 02/11/2016 02/10/2016  WBC 4.0 - 10.5 K/uL 10.4 9.3 12.4(H)  Hemoglobin 12.0 - 15.0 g/dL 10.6(L) 9.5(L) 9.5(L)  Hematocrit 36.0 - 46.0 % 37.7 33.3(L) 34.0(L)  Platelets 150 - 400 K/uL 161 149(L) 160   BMP Latest Ref Rng & Units 02/12/2016 02/11/2016 02/10/2016  Glucose 65 - 99 mg/dL 145(H) 114(H) 142(H)  BUN 6 - 20 mg/dL 87(H) 87(H) 89(H)  Creatinine 0.44 - 1.00 mg/dL 5.76(H) 5.56(H) 5.57(H)  BUN/Creat Ratio 11 - 26 - - -  Sodium 135 - 145  mmol/L 142 142 142  Potassium 3.5 - 5.1 mmol/L 4.1 3.3(L) 3.8  Chloride 101 - 111 mmol/L 89(L) 91(L) 92(L)  CO2 22 - 32 mmol/L 36(H) 37(H) 35(H)  Calcium 8.9 - 10.3 mg/dL 8.2(L) 7.7(L) 7.9(L)   Urine Culture (02/08/2016): E Coli, pan-sensitive  Blood Cultures (02/08/2016): 1/2 growing E Coli, pan-sensitive Blood Culture (02/10/2016): no growth 1 day  Lab Results  Component Value Date   INR 8.17 (HH) 02/12/2016   INR 8.76 (HH) 02/11/2016   INR >10.00 (HH) 02/10/2016   Filed Weights   02/09/16 0441 02/10/16 1455 02/12/16 0500  Weight: 170 lb 10.2 oz (77.4 kg) 170 lb 13.7 oz (77.5 kg) 168 lb 3.4 oz (76.3 kg)    Assessment/Plan:  Principal Problem:   UTI (lower urinary tract infection) Active Problems:   Hypothyroidism   Hyperlipidemia   CKD (chronic kidney disease), stage V (HCC)   Atrial fibrillation (HCC)   Anemia associated with chronic renal failure   External hemorrhoids   Acute kidney injury superimposed on chronic kidney disease (HCC)   Dehydration   Sepsis ([place])   Supratherapeutic INR   E. coli sepsis ([place])  #UTI #E Coli bacteremia Afebrile, leukocytosis resolved.  Mental status back to baseline.  Urine culture and 1/2 blood cultures growing pan-sensitive E Coli.  Treated with IV antibiotics (zosyn, then ceftriaxone) for 3 days, then switched to cephalexin. -  Continue renal dosed cephalexin 250 mg daily for 7 days ABx for gram negative bacteremia and complicated cystitis (last 9/28)  #Supratherapeutic INR On warfarin for AF stroke prophylaxis, INR goal 2-3, presented supratherapeutic with INR >10.  Given total of 12.5 mg PO Vit K 9/22-9/23 and additional 2.5 mg PO Vit K on 9/25. No active bleeding, but would like to see INR come down to 5-6 prior to discharge for patient safety with fall risk at rehab.  INR coming down slowly, 8.17 today. -Additional 2.5 mg PO Vit K -Pharmacy consult for anticoagulation  #AF with RVR Rates moderately well controlled 100-110.   Metoprolol decreased to 25 mg daily (from 75 mg at home) on admission with concern for sepsis, increased to 50 mg and she became mildly hypotensive.  Rates now controlled and normotensive on metoprolol succinate 50 mg daily. -Amiodarone 200 mg BID -Metoprolol 50 mg daily  #CHF Pulmonary but no peripheral edema, hypotensive at presentation but now normotensive after 2L and antibiotics. Good oxygenation on room air.  Volume status is precarious with CHF, CKD, and concern for sepsis.  Weight stable. -Continue diuretic regimen at lower dose with 40 mg PO lasix BID -Fluid restrict 1200 mL -I/Os and daily weights  #Acute on Chronic Kidney Disease Not a dialysis candidate.  Acute decrease in renal function likely prerenal secondary to dehydration and infection.  Creatine now back near baseline. -Daily BMPs -I/Os and daily weights -continue home calcimimetic, PO bicarb  #Elevated Troponins Resolved.  She has no chest pain or dyspnea, but has elevated troponins and possible new ischemic EKG changes.  Her elevated cardiac markers likely represent demand ischemia in the setting of infection.  She is currently anticoagulated with supratherapeutic INR, so unless STEMI emerges no further treatment would be available even if occlusive disease.  #Hypokalemia Resolved with PO KCl.  Normal Mg.  Dispo: Anticipated discharge in approximately 2 day(s).   Minus Liberty, MD 02/12/2016, 7:08 AM Pager: [phone]

## 2016-02-12 NOTE — Clinical Social Work Note (Signed)
Clinical Social Work Assessment  Patient Details  Name: [name] MRN: [id number] Date of Birth: 22-Feb-1935  Date of referral:  02/12/16               Reason for consult:  Facility Placement                Permission sought to share information with:  Facility Sport and exercise psychologist, Family Supports Permission granted to share information::  Yes, Verbal Permission Granted  Name::      Landscape architect)  Agency::   (SNF)  Relationship::   (daughter)  Contact Information:     Housing/Transportation Living arrangements for the past 2 months:  [place], Apartment Source of Information:  Patient, Adult Children Patient Interpreter Needed:  None Criminal Activity/Legal Involvement Pertinent to Current Situation/Hospitalization:  No - Comment as needed Significant Relationships:  Adult Children Lives with:  Facility Resident Do you feel safe going back to the place where you live?    Need for family participation in patient care:  Yes (Comment)  Care giving concerns:  Daughter states she feels that patient is not being cared for very well at [facility]   Social Worker assessment / plan:  CSW spoke with patient's daughter regarding concerns of returning to [place].  Patient and daughter are not wanting the patient to return to [facility].  Both claim neglect- patient staying in soiled clothes for hours on end and not getting the attention she feels she needs.  CSW offered support and will present bed offers once available.  Employment status:  Retired Nurse, adult PT Recommendations:  [place] / Referral to community resources:  [place]  Patient/Family's Response to care:  Patient and daughter are agreeable to SNF but no return to [place].  Patient/Family's Understanding of and Emotional Response to Diagnosis, Current Treatment, and Prognosis:  Patient and daughter exhibit high levels of anxiety  regarding the transition to SNF due to the reported lack of care received at [facility] from [facility].  CSW offered support to both daughter and patient.  Emotional Assessment Appearance:  Appears stated age Attitude/Demeanor/Rapport:    Affect (typically observed):  Accepting, Adaptable Orientation:  Oriented to Self, Oriented to Place, Oriented to  Time, Oriented to Situation Alcohol / Substance use:  Not Applicable Psych involvement (Current and /or in the community):  No (Comment)  Discharge Needs  Concerns to be addressed:  Care Coordination Readmission within the last 30 days:  No Current discharge risk:  None Barriers to Discharge:  Continued Medical Work up   [employer], LCSW 02/12/2016, 10:53 AM

## 2016-02-12 NOTE — Progress Notes (Signed)
   02/12/16 1556  Vitals  BP (!) 71/53  MAP (mmHg) (!) 60   Dr. Inda Castle notified of BP and troponin level .24

## 2016-02-12 NOTE — Progress Notes (Signed)
[place] for Coumadin Indication: atrial fibrillation  No Known Allergies  Patient Measurements: Height: 5\' 7"  (170.2 cm) Weight: 168 lb 3.4 oz (76.3 kg) IBW/kg (Calculated) : 61.6   Vital Signs: Temp: 99 F (37.2 C) (09/26 0451) Temp Source: Oral (09/26 0451) BP: 120/68 (09/26 0451) Pulse Rate: 104 (09/26 0451)  Labs:  Recent Labs  02/10/16 0417 02/11/16 0555 02/12/16 0524  HGB 9.5* 9.5* 10.6*  HCT 34.0* 33.3* 37.7  PLT 160 149* 161  LABPROT >90.0* 74.9* 70.9*  INR >10.00* 8.76* 8.17*  CREATININE 5.57* 5.56* 5.76*    Estimated Creatinine Clearance: 8.6 mL/min (by C-G formula based on SCr of 5.76 mg/dL (H)).   Medications:  Scheduled:  . amiodarone  200 mg Oral BID  . atorvastatin  40 mg Oral Daily  . calcitRIOL  0.25 mcg Oral Q M,W,F  . cephALEXin  250 mg Oral Daily  . dextrose  1 Tube Oral Once  . furosemide  40 mg Oral BID  . guaiFENesin  600 mg Oral BID  . hydrocortisone  1 application Rectal BID  . levothyroxine  112 mcg Oral QAC breakfast  . metoprolol succinate  50 mg Oral Daily  . pantoprazole  40 mg Oral Daily  . phytonadione  2.5 mg Oral Once  . sodium bicarbonate  1,300 mg Oral TID  . sodium chloride flush  3 mL Intravenous Q12H    Assessment: 81 yo female with AFib and supratherapeutic INR. INR remains elevated at 8.1, despite multiple doses of Vitamin K. No bleeding. Hg is stable and pltc wnl. No s/sx bleeding.  Goal of Therapy:  INR 2-3 Monitor platelets by anticoagulation protocol: Yes   Plan:  Hold warfarin Daily INR Watch for bleeding      Hughes Better, PharmD, BCPS Clinical Pharmacist 02/12/2016 9:38 AM

## 2016-02-12 NOTE — Progress Notes (Addendum)
Patient seen at bedside as requested by day team. Patient was noted to be more somnolent and hypotensive today with continued afib with HR 110s-120s. Patient is now in the SDU and resting comfortably. She is somnolent, but arouses and answers questions appropriately. She complains of some rectal pain secondary to her hemorrhoids, but no CP, SOB, or lightheadedness/dizziness. There has been no obvious bleeding.  On exam, BP was 79/48 with MAP of 55. Heart sounds are distant and irregularly irregular. Lungs have minimal basilar crackles, otherwise clear. Abdomen is soft with some generalized tenderness, +BS. Extremities are dry with skin tenting, no peripheral edema. Conjunctiva is pale and cap refill is diminished.  I think patient can tolerate further fluids as she appears dry on exam. We have given her another 1 L NS bolus with improvement in BP to 90s/60s and MAP of 71. Will continue maintenance fluids and plan as outlined in Dr. Quay Burow note. Will also add CBG checks and SSI. In addition, due to patient's supratherapeutic INR during admission and continued hypotension with above exam findings, I am concerned that she may have internal bleeding. We will obtain a CT head and abd/pelvis to evaluate for this. We will continue to monitor her hemodynamics, adjust fluids as tolerated, and consider transfer to ICU with PCCM for pressors if she has persistent hypotension.  Zada Finders, MD IMTS PGY-2 Pager: [phone]  Addendum: Discussed with radiology, CT abdomen/pelvis is without obvious intra-abdominal hematoma but does show moderate sized pericardial effusion. Moderate left pleural effusion and distended gallbladder are also seen. CT head is negative for bleed. Patient has transient response to fluid challenges with subsequent drop in BP and MAP. Have discussed with PCCM who have agreed to see patient. Will obtain TTE to further evaluate pericardial effusion. INR is >10.0, will give Vitamin K 5 mg po once  now. Appreciate PCCM and Radiology assistance.

## 2016-02-12 NOTE — NC FL2 (Signed)
[place] MEDICAID FL2 LEVEL OF CARE SCREENING TOOL     IDENTIFICATION  Patient Name: [name] Birthdate: 03-23-1935 Sex: female Admission Date (Current Location): 02/08/2016  Circles Of Care and [place] Number:  Herbalist and Address:  [facility], [place] [address], [place], [place] [address]      Provider Number: [id number]  Attending Physician Name and Address:  Annia Belt, MD  Relative Name and Phone Number:       Current Level of Care: Hospital Recommended Level of Care: Nursing Facility Prior Approval Number:    Date Approved/Denied:   PASRR Number: [id number]  Discharge Plan: SNF    Current Diagnoses: Patient Active Problem List   Diagnosis Date Noted  . E. coli sepsis ([place])   . Acute kidney injury superimposed on chronic kidney disease ([place])   . Dehydration   . Sepsis ([place])   . Supratherapeutic INR   . UTI (lower urinary tract infection) 02/08/2016  . Palliative care encounter   . Goals of care, counseling/discussion   . DNR (do not resuscitate)   . Cardiorenal syndrome   . Chronic atrial fibrillation ([place])   . Papular rash 09/19/2015  . Ulcer of left lower extremity ([place]) 09/19/2015  . Benign neoplasm of transverse colon 12/17/2014  . Gastritis   . Melena 12/12/2014  . UGI bleed 12/12/2014  . Absolute anemia   . CVA (cerebral vascular accident) ([place]) 10/01/2014  . TIA (transient ischemic attack) 09/30/2014  . Arthralgia of both hands 08/04/2014  . Preventative health care 05/03/2014  . Facet arthropathy of spine 04/26/2014  . Vitamin D deficiency 11/03/2012  . External hemorrhoids 06/28/2012  . Intermittent sleepiness 06/28/2012  . Anemia associated with chronic renal failure 04/03/2011  . MGUS (monoclonal gammopathy of unknown significance) 04/03/2011  . Atrial fibrillation ([place]) 01/24/2011  . History of osteomyelitis 09/05/2009  . Hypothyroidism 02/01/2007  . Diabetes mellitus with neuropathy ([place])  07/03/2006  . CKD (chronic kidney disease), stage V ([place]) 07/03/2006  . Hyperlipidemia 03/02/2006  . Essential hypertension 03/02/2006  . VENOUS INSUFFICIENCY 03/02/2006  . EDEMA LEG 03/02/2006  . History of amputation of lesser toe ([place]) 03/02/2006    Orientation RESPIRATION BLADDER Height & Weight     Self, Time, Situation, Place  Normal Incontinent Weight: 168 lb 3.4 oz (76.3 kg) Height:  5\' 7"  (170.2 cm)  BEHAVIORAL SYMPTOMS/MOOD NEUROLOGICAL BOWEL NUTRITION STATUS      Continent    AMBULATORY STATUS COMMUNICATION OF NEEDS Skin     Verbally Surgical wounds                       Personal Care Assistance Level of Assistance  Bathing, Dressing Bathing Assistance: Limited assistance   Dressing Assistance: Limited assistance     Functional Limitations Info             SPECIAL CARE FACTORS FREQUENCY  PT (By licensed PT), OT (By licensed OT)     PT Frequency: daily OT Frequency: daily            Contractures Contractures Info: Not present    Additional Factors Info  Isolation Precautions         Isolation Precautions Info: MRSA     Current Medications (02/12/2016):  This is the current hospital active medication list Current Facility-Administered Medications  Medication Dose Route Frequency Provider Last Rate Last Dose  . 0.9 %  sodium chloride infusion   Intravenous Continuous Carlyle Basques, MD  Stopped at 02/09/16 2300  . acetaminophen (TYLENOL) tablet 650 mg  650 mg Oral Q6H PRN Alexa Angela Burke, MD       Or  . acetaminophen (TYLENOL) suppository 650 mg  650 mg Rectal Q6H PRN Alexa Angela Burke, MD      . amiodarone (PACERONE) tablet 200 mg  200 mg Oral BID Alexa Angela Burke, MD   200 mg at 02/12/16 0959  . atorvastatin (LIPITOR) tablet 40 mg  40 mg Oral Daily Alexa Angela Burke, MD   40 mg at 02/12/16 0959  . calcitRIOL (ROCALTROL) capsule 0.25 mcg  0.25 mcg Oral Q M,W,F Alexa R Burns, MD   0.25 mcg at 02/11/16 1031  . cephALEXin (KEFLEX) capsule 250 mg  250 mg  Oral Daily Alexa Angela Burke, MD   250 mg at 02/12/16 0959  . dextrose (GLUTOSE) 40 % oral gel 37.5 g  1 Tube Oral Once Alexa R Burns, MD      . dextrose 50 % solution 50 mL  1 ampule Intravenous PRN Alexa Angela Burke, MD   50 mL at 02/09/16 0707  . furosemide (LASIX) tablet 40 mg  40 mg Oral BID Alexa Angela Burke, MD   40 mg at 02/12/16 0725  . guaiFENesin (MUCINEX) 12 hr tablet 600 mg  600 mg Oral BID Minus Liberty, MD   600 mg at 02/12/16 0959  . hydrocortisone (ANUSOL-HC) 2.5 % rectal cream 1 application  1 application Rectal BID Florinda Marker, MD   1 application at [id number]  . levothyroxine (SYNTHROID, LEVOTHROID) tablet 112 mcg  112 mcg Oral QAC breakfast Florinda Marker, MD   112 mcg at 02/12/16 0619  . metoprolol succinate (TOPROL-XL) 24 hr tablet 50 mg  50 mg Oral Daily Alexa Angela Burke, MD   50 mg at 02/12/16 0959  . ondansetron (ZOFRAN) tablet 8 mg  8 mg Oral Q8H PRN Minus Liberty, MD   8 mg at 02/12/16 0959  . pantoprazole (PROTONIX) EC tablet 40 mg  40 mg Oral Daily Alexa Angela Burke, MD   40 mg at 02/12/16 0959  . senna-docusate (Senokot-S) tablet 2 tablet  2 tablet Oral Daily PRN Alexa Angela Burke, MD      . sodium bicarbonate tablet 1,300 mg  1,300 mg Oral TID Florinda Marker, MD   1,300 mg at 02/12/16 0958  . sodium chloride flush (NS) 0.9 % injection 3 mL  3 mL Intravenous Q12H Alexa Angela Burke, MD   3 mL at 02/11/16 2237  . witch hazel-glycerin (TUCKS) pad   Topical PRN Florinda Marker, MD         Discharge Medications: Please see discharge summary for a list of discharge medications.  Relevant Imaging Results:  Relevant Lab Results:   Additional Information SSN: [id number]  Dulcy Fanny, LCSW

## 2016-02-12 NOTE — Progress Notes (Signed)
Patient just transferred to [facility] room 21 accompanied by family members who were asked to stay at the waiting room until patient is situated.  On-call IM MD V. Posey Pronto made aware of the transfer.

## 2016-02-13 ENCOUNTER — Inpatient Hospital Stay (HOSPITAL_COMMUNITY): Payer: Medicare Other

## 2016-02-13 DIAGNOSIS — A419 Sepsis, unspecified organism: Secondary | ICD-10-CM

## 2016-02-13 DIAGNOSIS — N189 Chronic kidney disease, unspecified: Secondary | ICD-10-CM

## 2016-02-13 DIAGNOSIS — I313 Pericardial effusion (noninflammatory): Secondary | ICD-10-CM

## 2016-02-13 DIAGNOSIS — I959 Hypotension, unspecified: Secondary | ICD-10-CM

## 2016-02-13 DIAGNOSIS — I319 Disease of pericardium, unspecified: Secondary | ICD-10-CM

## 2016-02-13 DIAGNOSIS — J90 Pleural effusion, not elsewhere classified: Secondary | ICD-10-CM

## 2016-02-13 DIAGNOSIS — R791 Abnormal coagulation profile: Secondary | ICD-10-CM

## 2016-02-13 DIAGNOSIS — N179 Acute kidney failure, unspecified: Secondary | ICD-10-CM

## 2016-02-13 DIAGNOSIS — N390 Urinary tract infection, site not specified: Secondary | ICD-10-CM

## 2016-02-13 DIAGNOSIS — R6521 Severe sepsis with septic shock: Secondary | ICD-10-CM

## 2016-02-13 LAB — ECHOCARDIOGRAM COMPLETE
AO mean calculated velocity dopler: 126 cm/s
AOVTI: 32.4 cm
AV Area VTI: 0.62 cm2
AV Mean grad: 7 mmHg
AV area mean vel ind: 0.3 cm2/m2
AV peak Index: 0.33
AV vel: 0.6
AVA: 0.6 cm2
AVAREAMEANV: 0.57 cm2
AVAREAVTIIND: 0.32 cm2/m2
AVCELMEANRAT: 0.23
AVLVOTPG: 1 mmHg
AVPG: 12 mmHg
AVPKVEL: 176 cm/s
Ao pk vel: 0.24 m/s
E decel time: 232 msec
FS: 10 % — AB (ref 28–44)
Height: 67 in
IVS/LV PW RATIO, ED: 1.2
LA diam index: 2.38 cm/m2
LA vol A4C: 71.8 ml
LASIZE: 45 mm
LDCA: 2.54 cm2
LEFT ATRIUM END SYS DIAM: 45 mm
LVOT VTI: 7.59 cm
LVOT peak VTI: 0.23 cm
LVOT peak vel: 42.9 cm/s
LVOTD: 18 mm
LVOTSV: 19 mL
MV Dec: 232
MVPG: 2 mmHg
MVPKEVEL: 77.6 m/s
P 1/2 time: 584 ms
PW: 10 mm — AB (ref 0.6–1.1)
Reg peak vel: 242 cm/s
TRMAXVEL: 242 cm/s
Valve area index: 0.32
Weight: 2744.29 oz

## 2016-02-13 LAB — CBC
HEMATOCRIT: 33.1 % — AB (ref 36.0–46.0)
HEMOGLOBIN: 9.1 g/dL — AB (ref 12.0–15.0)
MCH: 24.1 pg — ABNORMAL LOW (ref 26.0–34.0)
MCHC: 27.5 g/dL — AB (ref 30.0–36.0)
MCV: 87.8 fL (ref 78.0–100.0)
Platelets: 108 10*3/uL — ABNORMAL LOW (ref 150–400)
RBC: 3.77 MIL/uL — ABNORMAL LOW (ref 3.87–5.11)
RDW: 23.6 % — ABNORMAL HIGH (ref 11.5–15.5)
WBC: 9.6 10*3/uL (ref 4.0–10.5)

## 2016-02-13 LAB — HEPATIC FUNCTION PANEL
ALBUMIN: 2.6 g/dL — AB (ref 3.5–5.0)
ALT: 11 U/L — ABNORMAL LOW (ref 14–54)
AST: 20 U/L (ref 15–41)
Alkaline Phosphatase: 96 U/L (ref 38–126)
Bilirubin, Direct: 0.3 mg/dL (ref 0.1–0.5)
Indirect Bilirubin: 0.5 mg/dL (ref 0.3–0.9)
TOTAL PROTEIN: 6.5 g/dL (ref 6.5–8.1)
Total Bilirubin: 0.8 mg/dL (ref 0.3–1.2)

## 2016-02-13 LAB — TSH: TSH: 11.371 u[IU]/mL — AB (ref 0.350–4.500)

## 2016-02-13 LAB — PROTIME-INR
INR: 3.05
Prothrombin Time: 89.5 seconds — ABNORMAL HIGH (ref 11.4–15.2)
Prothrombin Time: 32.2 seconds — ABNORMAL HIGH (ref 11.4–15.2)

## 2016-02-13 LAB — BASIC METABOLIC PANEL
ANION GAP: 13 (ref 5–15)
BUN: 79 mg/dL — ABNORMAL HIGH (ref 6–20)
CHLORIDE: 99 mmol/L — AB (ref 101–111)
CO2: 30 mmol/L (ref 22–32)
Calcium: 7.3 mg/dL — ABNORMAL LOW (ref 8.9–10.3)
Creatinine, Ser: 5.44 mg/dL — ABNORMAL HIGH (ref 0.44–1.00)
GFR calc Af Amer: 8 mL/min — ABNORMAL LOW (ref >60)
GFR, EST NON AFRICAN AMERICAN: 7 mL/min — AB (ref >60)
GLUCOSE: 154 mg/dL — AB (ref 65–99)
POTASSIUM: 3.9 mmol/L (ref 3.5–5.1)
Sodium: 142 mmol/L (ref 135–145)

## 2016-02-13 LAB — GLUCOSE, CAPILLARY
GLUCOSE-CAPILLARY: 139 mg/dL — AB (ref 65–99)
GLUCOSE-CAPILLARY: 213 mg/dL — AB (ref 65–99)
GLUCOSE-CAPILLARY: 139 mg/dL — AB (ref 65–99)
Glucose-Capillary: 125 mg/dL — ABNORMAL HIGH (ref 65–99)

## 2016-02-13 LAB — CULTURE, BLOOD (ROUTINE X 2): Culture: NO GROWTH

## 2016-02-13 LAB — T4, FREE: FREE T4: 1.15 ng/dL — AB (ref 0.61–1.12)

## 2016-02-13 MED ORDER — SODIUM CHLORIDE 0.9 % IV SOLN
INTRAVENOUS | Status: DC
  Administered 2016-02-13 – 2016-02-14 (×4): via INTRAVENOUS

## 2016-02-13 MED ORDER — VITAMIN K1 10 MG/ML IJ SOLN
10.0000 mg | Freq: Once | INTRAMUSCULAR | Status: AC
  Administered 2016-02-13: 10 mg via INTRAVENOUS
  Filled 2016-02-13: qty 1

## 2016-02-13 MED ORDER — SODIUM CHLORIDE 0.9 % IV BOLUS (SEPSIS)
1000.0000 mL | Freq: Once | INTRAVENOUS | Status: AC
  Administered 2016-02-13: 1000 mL via INTRAVENOUS

## 2016-02-13 MED ORDER — PHYTONADIONE 5 MG PO TABS
5.0000 mg | ORAL_TABLET | Freq: Once | ORAL | Status: AC
  Administered 2016-02-13: 5 mg via ORAL
  Filled 2016-02-13: qty 1

## 2016-02-13 MED ORDER — DEXTROSE 5 % IV SOLN
2.0000 ug/min | INTRAVENOUS | Status: DC
  Filled 2016-02-13: qty 4

## 2016-02-13 MED ORDER — SODIUM CHLORIDE 0.9 % IV SOLN: Freq: Once | INTRAVENOUS | Status: DC

## 2016-02-13 NOTE — Progress Notes (Signed)
Medicine attending: I examined this patient today together with resident physician Dr. Minus Liberty and I concur with his evaluation and management plan which we discussed together. There was a change in the patient's condition yesterday. She complained of chest tightness and dyspnea. Blood pressure fell even lower than her low baseline, down to 71 systolic. No signs of acute heart failure on exam. Stable, oxygenation 100% on 2 L nasal cannula oxygen. Cardiogram showed no acute change and no change from baseline tracings. Chest x-ray without any gross infiltrate. Small right pleural effusion. Troponin trending down from peak value of 0.64 on September 22, 0.56 on September 23, 0.24 on September 26. No significant change in her chronic renal insufficiency with BUN 87, creatinine 5.8. Repeat values this morning similar after some fluid boluses BUN 79, creatinine 5.4. We asked our critical care team to evaluate her. Echocardiogram recommended. This shows a small pericardial and moderate to large left pleural effusion. Her prothrombin time has been persistently elevated despite multiple doses of vitamin K. Consultant recommended plasma infusion which is in progress. The possibility that her pleural effusion is hemorrhagic unlikely with her stable hemoglobin. We elected to put her back on parenteral antibiotics for her Escherichia coli sepsis. She had just been transitioned to oral antibiotics yesterday. On my exam today she is anxious but comfortable and feels better than she did yesterday. Lung exam Dull to percussion with decreased breath sounds limited to the left lung base and I feel that the CT scan exaggerates the amount of pleural fluid compared with clinical exam or chest radiograph. CT abdomen done this morning but does not show any evidence for intra-abdominal hemorrhage. She has multiple complex medical issues and prognosis remains guarded. Escherichia coli sepsis in the setting of advanced  cardiac and renal disease. We will continue to try to optimize her condition but prognosis is guarded.

## 2016-02-13 NOTE — Progress Notes (Signed)
ANTICOAGULATION CONSULT NOTE  Pharmacy Consult for Coumadin Indication: atrial fibrillation  No Known Allergies  Patient Measurements: Height: 5\' 7"  (170.2 cm) Weight: 171 lb 8.3 oz (77.8 kg) IBW/kg (Calculated) : 61.6   Vital Signs: Temp: 97.4 F (36.3 C) (09/27 0815) Temp Source: Oral (09/27 0815) BP: 79/58 (09/27 0815) Pulse Rate: 87 (09/27 0815)  Labs:  Recent Labs  02/11/16 0555 02/12/16 0524 02/12/16 1442 02/12/16 1541 02/12/16 2124 02/13/16 0232  HGB 9.5* 10.6*  --  9.4* 8.7* 9.1*  HCT 33.3* 37.7  --  32.5* 30.6* 33.1*  PLT 149* 161  --  137* 119* 108*  LABPROT 74.9* 70.9*  --   --   --  89.5*  INR 8.76* 8.17*  --   --   --  >10.00*  CREATININE 5.56* 5.76*  --   --   --  5.44*  TROPONINI  --   --  0.24*  --   --   --     Estimated Creatinine Clearance: 9.2 mL/min (by C-G formula based on SCr of 5.44 mg/dL (H)).   Medications:  Scheduled:  . sodium chloride   Intravenous Once  . amiodarone  200 mg Oral BID  . atorvastatin  40 mg Oral Daily  . calcitRIOL  0.25 mcg Oral Q M,W,F  . cefTRIAXone (ROCEPHIN)  IV  2 g Intravenous Q24H  . dextrose  1 Tube Oral Once  . guaiFENesin  600 mg Oral BID  . hydrocortisone  1 application Rectal BID  . insulin aspart  0-9 Units Subcutaneous TID WC  . levothyroxine  112 mcg Oral QAC breakfast  . pantoprazole  40 mg Oral Daily  . sodium bicarbonate  1,300 mg Oral TID  . sodium chloride flush  3 mL Intravenous Q12H    Assessment: 81 yo female with AFib and supratherapeutic INR. INR remains elevated >10, despite multiple doses of Vitamin K.   No bleeding noted, head CT negative. Hg is stable and pltc wnl. No s/sx bleeding.  Goal of Therapy:  INR 2-3 Monitor platelets by anticoagulation protocol: Yes   Plan:  Hold warfarin Repeat vitamin k again today Daily INR Watch for bleeding  Erin Hearing PharmD., BCPS Clinical Pharmacist Pager [phone] 02/13/2016 9:20 AM

## 2016-02-13 NOTE — Progress Notes (Signed)
  Echocardiogram 2D Echocardiogram has been performed.  Tresa Res 02/13/2016, 6:52 AM

## 2016-02-13 NOTE — Progress Notes (Signed)
CSW provided bed offers to patient and son. Son is requesting that palliative care see patient. They may consider taking patient home.  [name] LCSWA [phone]

## 2016-02-13 NOTE — Progress Notes (Signed)
Subjective: Feels much better than yesterday, with no dyspnea, chest pain, or fevers.  Yesterday afternoon, she developed shortness of breath, transient right sided chest pain, and concern that she was dying.  She was found to be more hypotensive to 70s/50s with heart rates in 110s, and she continued to be afebrile.  Maintaing good O2 saturation on room air.  She was transferred to step-down and given IV fluids boluses which only transiently improved her hypotension.  Made DNR again after Critical Care discussions with family.  Objective:  Vital signs in last 24 hours: Vitals:   02/13/16 1100 02/13/16 1130 02/13/16 1200 02/13/16 1211  BP: (!) 70/44 102/87 (!) 69/49   Pulse: 84 89 87   Resp: 14 (!) 9 13   Temp:    97.1 F (36.2 C)  TempSrc:    Oral  SpO2: 100% 100% 100%   Weight:      Height:       Filed Weights   02/12/16 0500 02/12/16 2200 02/13/16 0330  Weight: 168 lb 3.4 oz (76.3 kg) 171 lb 8.3 oz (77.8 kg) 171 lb 8.3 oz (77.8 kg)    Physical Exam  Constitutional: She is oriented to person, place, and time.  Frail, elderly woman in no distress, sitting in bed eating breakfast  Eyes: Conjunctivae are normal. No scleral icterus.  Cardiovascular:  Tachycardic, irregularly irregular rhythm, normal [id number], 2/6 systolic murmur Hands and feet slightly cool to touch  Pulmonary/Chest:  Normal work of breathing, good air movement, trace bibasilar crackles, reduced breath sounds L>R lower lung fields  Abdominal: Soft. She exhibits no distension. There is no tenderness.  Musculoskeletal: She exhibits no edema or tenderness.  Neurological: She is alert and oriented to person, place, and time.  Skin: Skin is warm and dry.  Psychiatric: She has a normal mood and affect. Her behavior is normal.   CBC Latest Ref Rng & Units 02/13/2016 02/12/2016 02/12/2016  WBC 4.0 - 10.5 K/uL 9.6 10.8(H) 11.5(H)  Hemoglobin 12.0 - 15.0 g/dL 9.1(L) 8.7(L) 9.4(L)  Hematocrit 36.0 - 46.0 % 33.1(L) 30.6(L)  32.5(L)  Platelets 150 - 400 K/uL 108(L) 119(L) 137(L)   BMP Latest Ref Rng & Units 02/13/2016 02/12/2016 02/11/2016  Glucose 65 - 99 mg/dL 154(H) 145(H) 114(H)  BUN 6 - 20 mg/dL 79(H) 87(H) 87(H)  Creatinine 0.44 - 1.00 mg/dL 5.44(H) 5.76(H) 5.56(H)  BUN/Creat Ratio 11 - 26 - - -  Sodium 135 - 145 mmol/L 142 142 142  Potassium 3.5 - 5.1 mmol/L 3.9 4.1 3.3(L)  Chloride 101 - 111 mmol/L 99(L) 89(L) 91(L)  CO2 22 - 32 mmol/L 30 36(H) 37(H)  Calcium 8.9 - 10.3 mg/dL 7.3(L) 8.2(L) 7.7(L)   Lab Results  Component Value Date   INR 3.05 02/13/2016   INR >10.00 (HH) 02/13/2016   INR 8.17 (HH) 02/12/2016   Urine Culture (02/08/2016): E Coli, pan-sensitive Blood Cultures (02/08/2016): 1/2 with E Coli, pan-sensitive Blood Culture (02/10/2016): no growth 2 days  Component     Latest Ref Rng & Units 02/08/2016 02/12/2016 02/12/2016          6:34 PM  9:24 PM  Lactic Acid, Venous     0.5 - 1.9 mmol/L 1.83 1.9 1.7    Echocardiogram (02/13/2016):  - EF 20-25% - Severe biventricular dysfunction. Mild circumferential   pericardial effusion. No tamponade.   Low EF aortic stenosis, at least moderate.   Severe left pleural effusion.  CT Head (02/13/2016): IMPRESSION: No intracranial hemorrhage or acute intracranial process.  New Small RIGHT occipital lobe infarct, does not appear acute. Old RIGHT frontal subcortical infarct. Old bilateral thalamus and RIGHT basal ganglia infarcts.  Severe atherosclerosis.  CT Abdomen and Pelvis (02/13/2016): IMPRESSION: No intra-abdominal hematoma.  Mildly distended gallbladder with small layering calculi. Ultrasound may provide better evaluation of the gallbladder if clinically indicated.  No bowel obstruction or active inflammation.  Moderate size pericardial effusion. Correlation with echocardiogram recommended.  Partially visualized moderate left pleural effusion with associated partial compressive atelectasis of the left lower lobe. Pneumonia  is not excluded.  Chest Radiograph AP (02/12/2016): Unchanged from admission.  IMPRESSION: Cardiomegaly and interstitial edema with bilateral pleural effusions and basilar atelectasis.  Assessment/Plan:  Principal Problem:   UTI (lower urinary tract infection) Active Problems:   Hypothyroidism   Hyperlipidemia   CKD (chronic kidney disease), stage V (HCC)   Atrial fibrillation (HCC)   Anemia associated with chronic renal failure   External hemorrhoids   Acute kidney injury superimposed on chronic kidney disease ([place])   Dehydration   Sepsis ([place])   Supratherapeutic INR   E. coli sepsis ([place])  #Hypotension Unclear cause of her clinical deterioration at this time.  EF 20-25%, down from 30-35% at the beginning of this month and small pericardial effusion without tamponade.  Moderate left pleural effusion which was present on admission but may be somewhat increased. CT head, A/P with no hemorrhage.  No lactic acidosisShe was given 4.5L crystalloid without much improvement in BPs.  Possible etiologies are cardiogenic shock due to worsening systolic function following gram negative bacteremia, or recurrent infection.  -No pressors per DNR -Foley catheter to monitor UOP -Appreciate Critical Care help with management  #UTI #E Coli bacteremia She was treated with IV antibiotics (zosyn, then ceftriaxone) for 3 days, and responded well with clinical improvement and resolving leukocytosis.  She was then switched to oral cephalexin on 9/25 after culture sensitivities showed pan-sensitive E Coli.  On 9/26, she had clinical deterioration with hypotension and increased WBCs.  She was switched back to IV ceftriaxone on 9/26 evening. -D/c cephalexin -Resume ceftriaxone  #Supratherapeutic INR On warfarin for AF stroke prophylaxis, INR goal 2-3, presented supratherapeutic with INR >10.  Given total of 15 mg PO Vit K with minimal effect on INR.  She was transfused with 3U FFP given 10 mg IV Vitamin  K before the with concern that poor absorption of oral Vit K could explain her refractory supratherapeutic INR. -Daily INR  #AF with RVR Rates moderately well controlled 100-110.  Metoprolol decreased to 25 mg daily (from 75 mg at home) on admission with concern for sepsis, increased to 50 mg and she became mildly hypotensive.  Rates now controlled and normotensive on metoprolol succinate 50 mg daily. -Amiodarone 200 mg BID -Metoprolol 50 mg daily  #CHF Pulmonary but no peripheral edema, hypotensive at presentation but now normotensive after 2L and antibiotics. Good oxygenation on room air.  Volume status is precarious with CHF, CKD, and concern for sepsis.  Weight stable. -Continue diuretic regimen at lower dose with 40 mg PO lasix BID -Fluid restrict 1200 mL -I/Os and daily weights  #Acute on Chronic Kidney Disease Not a dialysis candidate.  Acute decrease in renal function likely prerenal secondary to dehydration and infection.  Creatine now back near baseline. -Daily BMPs -I/Os and daily weights -continue home calcimimetic, PO bicarb  #Elevated Troponins Resolved.  She has no chest pain or dyspnea, but has elevated troponins and possible new ischemic EKG changes.  Her elevated cardiac markers likely  represent demand ischemia in the setting of infection.  She is currently anticoagulated with supratherapeutic INR, so unless STEMI emerges no further treatment would be available even if occlusive disease.  #Hypokalemia Resolved with PO KCl.  Normal Mg.  Dispo: Anticipated discharge in approximately 3 day(s).   Minus Liberty, MD 02/13/2016, 12:51 PM Pager: [phone]

## 2016-02-13 NOTE — Consult Note (Signed)
PULMONARY / CRITICAL CARE MEDICINE   Name: [name] MRN: [id number] DOB: June 02, 1934    ADMISSION DATE:  02/08/2016 CONSULTATION DATE:  02/13/2016  REFERRING MD: Dr Hervey Ard  CHIEF COMPLAINT:  Hypotension  HISTORY OF PRESENT ILLNESS:   81 year old female with past medical history as below, which is significant for atrial fibrillation, chronic kidney disease stage V (she has been told she is a poor candidate for dialysis), diabetes, hypothyroidism, and stroke. She was recently admitted to [facility] September 1 through September 7 for atrial fibrillation with RVR, acute on chronic renal failure, and heart failure. She was thought to be a poor dialysis candidate and was eventually seen by palliative care and made a DO NOT RESUSCITATE and was discharged with plans for palliative medicine at the rehabilitation facility and eventually home hospice. [facility] with complaints of altered mental status, somnolence, and hypertension. She was initially worked up as a severe sepsis case and treated with broad-spectrum antibiotics. She was also severely quite neuropathic with INR greater than 10 treated with vitamin K. Urine grew Escherichia coli and she was narrowed to Rocephin and then Keflex. Overall she had been improving and plans were for discharge on September 27, however September 26 she started complaining of right shoulder pain associated shortness of breath and dizziness. Heart rate increased to the 120s and she again became hypotensive with systolic blood pressures in the 90s. She transferred to the stepdown unit. Cause of hypotension at that point unclear. No clear evidence of bleeding noted despite INR which remained high. It was felt by the rounding team that she was hypovolemic and she was administered 4 L of crystalloid IV fluids over the next few hours, however, blood pressure failed to improve. PCCM consulted.  PAST MEDICAL HISTORY :  She  has a  past medical history of Anemia; Asthma; Atrial fibrillation ([place]); Blood transfusion; Chronic renal insufficiency; Chronic venous insufficiency; Diabetes mellitus; Hyperlipidemia; Hypertension; Hypothyroidism; MGUS (monoclonal gammopathy of unknown significance); Osteomyelitis ([place]); Stroke [facility]); and Thyroid disease.  PAST SURGICAL HISTORY: She  has a past surgical history that includes Eye surgery; Appendectomy; Amputation (04/30/2011); Esophagogastroduodenoscopy (N/A, 12/15/2014); and Colonoscopy (N/A, 12/17/2014).  No Known Allergies  No current facility-administered medications on file prior to encounter.    Current Outpatient Prescriptions on File Prior to Encounter  Medication Sig  . acetaminophen (TYLENOL) 325 MG tablet Take 2 tablets (650 mg total) by mouth every 6 (six) hours as needed for mild pain (or Fever >/= 101). (Patient taking differently: Take 650 mg by mouth every 6 (six) hours as needed (pain/ fever >101). )  . amiodarone (PACERONE) 200 MG tablet Take 1 tablet (200 mg total) by mouth 2 (two) times daily. (Patient taking differently: Take 200 mg by mouth See admin instructions. Take 1 tablet ( 200 mg) by mouth twice daily unless SBP <110)  . atorvastatin (LIPITOR) 40 MG tablet TAKE 1 TABLET BY MOUTH EVERY DAY (Patient taking differently: TAKE 1 TABLET BY MOUTH EVERY DAY AT 6PM)  . calcitRIOL (ROCALTROL) 0.25 MCG capsule Take 1 capsule by mouth every Monday, Wednesday, and Friday.  . cetirizine (ZYRTEC) 10 MG chewable tablet Chew 1 tablet (10 mg total) by mouth daily.  . Cholecalciferol (VITAMIN D3) 10000 UNITS capsule Take 1 capsule (10,000 Units total) by mouth once a week. (Patient taking differently: Take 10,000 Units by mouth every Tuesday. )  . furosemide (LASIX) 80 MG tablet Take 1 tablet (80 mg total) by mouth 2 (  two) times daily. (Patient taking differently: Take 80 mg by mouth See admin instructions. Take 1 tablet (80 mg) by mouth at 9am and 5pm unless SBP <110)  .  hydrocortisone (ANUSOL-HC) 2.5 % rectal cream Place 1 application rectally 2 (two) times daily.  Marland Kitchen JANUVIA 25 MG tablet TAKE 1 TABLET BY MOUTH EVERY DAY  . levothyroxine (SYNTHROID, LEVOTHROID) 112 MCG tablet TAKE 1 TABLET(112 MCG) BY MOUTH DAILY BEFORE BREAKFAST  . lidocaine (LMX) 4 % cream Apply topically 2 (two) times daily as needed (rectal pain). (Patient taking differently: Apply 1 application topically 2 (two) times daily as needed (rectal pain). )  . metolazone (ZAROXOLYN) 5 MG tablet Take 1 tablet (5 mg total) by mouth daily. 30 minutes before AM lasix (Patient taking differently: Take 5 mg by mouth See admin instructions. Take 1 tablet (5 mg) by mouth daily 30 minutes before lasix unless SBP <110)  . metoprolol succinate (TOPROL-XL) 25 MG 24 hr tablet Take 3 tablets (75 mg total) by mouth daily. (Patient taking differently: Take 75 mg by mouth See admin instructions. Take 3 tablets (75 mg) by mouth daily unless SBP <110)  . pantoprazole (PROTONIX) 40 MG tablet TAKE 1 TABLET BY MOUTH EVERY DAY (Patient taking differently: TAKE 1 TABLET BY MOUTH EVERY DAY BEFORE BREAKFAST)  . polyethylene glycol (MIRALAX / GLYCOLAX) packet Take 17 g by mouth daily as needed for mild constipation or moderate constipation. Mix in 8 oz liquid and drink  . sennosides-docusate sodium (SENOKOT-S) 8.6-50 MG tablet Take 2 tablets by mouth daily as needed for constipation.   . sodium bicarbonate 650 MG tablet Take 2 tablets (1,300 mg total) by mouth 3 (three) times daily.  Marland Kitchen warfarin (COUMADIN) 5 MG tablet Take 1 tablet (5 mg total) by mouth daily. (Patient taking differently: Take 5 mg by mouth at bedtime. )  . witch hazel-glycerin (TUCKS) pad Apply topically as needed for itching. (Patient taking differently: Apply 1 application topically as needed (rectal itching). )  . Blood Glucose Monitoring Suppl (ONETOUCH VERIO FLEX SYSTEM) W/DEVICE KIT 1 each by Does not apply route every morning. Check blood sugar one time each  day  . clotrimazole (LOTRIMIN) 1 % cream Apply 1 application topically 2 (two) times daily. (Patient not taking: Reported on 02/08/2016)  . glipiZIDE (GLUCOTROL) 10 MG tablet Take 0.5 tablets (5 mg total) by mouth 2 (two) times daily with a meal. (Patient not taking: Reported on 02/08/2016)  . glucose blood (ONETOUCH VERIO) test strip Check blood sugar one time each day  . ONETOUCH DELICA LANCETS FINE MISC Check blood sugar one time each day   FAMILY HISTORY:  Her indicated that her mother is deceased. She indicated that only one of her four brothers is alive. She indicated that her daughter is alive. She indicated that her son is alive.   SOCIAL HISTORY: She  reports that she has never smoked. She has never used smokeless tobacco. She reports that she does not drink alcohol or use drugs.  REVIEW OF SYSTEMS:  Unable due to confusion Bolds are positive   Constitutional: weight loss, gain, night sweats, Fevers, chills, fatigue .  HEENT: headaches, Sore throat, sneezing, nasal congestion, post nasal drip, Difficulty swallowing, Tooth/dental problems, visual complaints visual changes, ear ache CV:  chest pain, radiates: ,Orthopnea, PND, swelling in lower extremities, dizziness, palpitations, syncope.  GI  heartburn, indigestion, abdominal pain, nausea, vomiting, diarrhea, change in bowel habits, loss of appetite, bloody stools.  Resp: cough, productive: , hemoptysis, dyspnea, chest pain,  pleuritic.  Skin: rash or itching or icterus GU: dysuria, change in color of urine, urgency or frequency. flank pain, hematuria  MS: joint pain or swelling. decreased range of motion  Psych: change in mood or affect. depression or anxiety.  Neuro: difficulty with speech, weakness, numbness, ataxia   SUBJECTIVE:  No events overnight, no new complaints.  VITAL SIGNS: BP (!) 88/69   Pulse 81   Temp 97.3 F (36.3 C) (Oral)   Resp (!) 7   Ht 5' 7"  (1.702 m)   Wt 77.8 kg (171 lb 8.3 oz)   SpO2 100%   BMI  26.86 kg/m   HEMODYNAMICS:    VENTILATOR SETTINGS:    INTAKE / OUTPUT: I/O last 3 completed shifts: In: 800 [P.O.:800] Out: -   PHYSICAL EXAMINATION: General:  Elderly female Neuro:  Alert, oriented x 3, delirium  HEENT:  [place]/AT, PERRL, no JVD Cardiovascular:  Muffled, IRIR Lungs:  clear Abdomen:  Soft, non-tender, non-distended Musculoskeletal:  No acute deformity or ROM limitation Skin:  Grossly intact  LABS:  BMET  Recent Labs Lab 02/11/16 0555 02/12/16 0524 02/13/16 0232  NA 142 142 142  K 3.3* 4.1 3.9  CL 91* 89* 99*  CO2 37* 36* 30  BUN 87* 87* 79*  CREATININE 5.56* 5.76* 5.44*  GLUCOSE 114* 145* 154*    Electrolytes  Recent Labs Lab 02/09/16 0507  02/11/16 0555 02/12/16 0524 02/13/16 0232  CALCIUM 7.7*  < > 7.7* 8.2* 7.3*  MG 1.9  --  2.0  --   --   < > = values in this interval not displayed.  CBC  Recent Labs Lab 02/12/16 1541 02/12/16 2124 02/13/16 0232  WBC 11.5* 10.8* 9.6  HGB 9.4* 8.7* 9.1*  HCT 32.5* 30.6* 33.1*  PLT 137* 119* 108*    Coag's  Recent Labs Lab 02/11/16 0555 02/12/16 0524 02/13/16 0232  INR 8.76* 8.17* >10.00*    Sepsis Markers  Recent Labs Lab 02/08/16 1208 02/12/16 1834 02/12/16 2124  LATICACIDVEN 1.83 1.9 1.7    ABG No results for input(s): PHART, PCO2ART, PO2ART in the last 168 hours.  Liver Enzymes  Recent Labs Lab 02/08/16 1150  AST 22  ALT 10*  ALKPHOS 80  BILITOT 1.5*  ALBUMIN 2.6*    Cardiac Enzymes  Recent Labs Lab 02/08/16 2211 02/09/16 0507 02/12/16 1442  TROPONINI 0.64* 0.56* 0.24*    Glucose  Recent Labs Lab 02/09/16 1256 02/09/16 1705 02/10/16 0830 02/10/16 1233 02/10/16 1900 02/11/16 1319  GLUCAP 186* 224* 115* 157* 167* 163*    Imaging Ct Head Wo Contrast  Result Date: 02/13/2016 CLINICAL DATA:  Somnolent, supratherapeutic INR. Assess for hemorrhage. Hypotensive. History of hypertension diabetes. EXAM: CT HEAD WITHOUT CONTRAST TECHNIQUE:  Contiguous axial images were obtained from the base of the skull through the vertex without intravenous contrast. COMPARISON:  MRI of the head August 31, 2014 FINDINGS: BRAIN: No intraparenchymal hemorrhage, mass effect, midline shift or acute large vascular territory infarcts. Patchy hypodensity RIGHT occipital lobe, new from prior MRI. Old RIGHT basal ganglia and bilateral thalamus lacunar infarcts better characterized on prior imaging. Patchy white matter hypodensities are less than expected for for age, compatible chronic small vessel ischemic disease. No abnormal extra-axial fluid collections. Basal cisterns are patent. VASCULAR: Severe calcific atherosclerosis of the carotid siphons and included vertebral arteries. SKULL: No skull fracture. No significant scalp soft tissue swelling. SINUSES/ORBITS: The mastoid air-cells and included paranasal sinuses are well-aerated.Atretic LEFT maxillary sinus compatible with chronic sinusitis without acute component.  Status post bilateral ocular lens implants. The included ocular globes and orbital contents are non-suspicious. OTHER: None. IMPRESSION: No intracranial hemorrhage or acute intracranial process. New Small RIGHT occipital lobe infarct, does not appear acute. Old RIGHT frontal subcortical infarct. Old bilateral thalamus and RIGHT basal ganglia infarcts. Severe atherosclerosis. Electronically Signed   By: Elon Alas M.D.   On: 02/13/2016 02:00   Dg Chest Port 1 View  Result Date: 02/12/2016 CLINICAL DATA:  81 y/o  F; dyspnea. EXAM: PORTABLE CHEST 1 VIEW COMPARISON:  02/08/2016 chest radiograph. FINDINGS: Stable cardiomediastinal silhouette given projection and technique with cardiomegaly and aortic atherosclerosis. Stable small pleural effusions, left greater than right with associated basilar atelectasis. Stable interstitial pulmonary edema. Surgical clips project over the lower neck. No acute osseous abnormality is identified. IMPRESSION: Stable  cardiomegaly, small left greater than right effusions, and interstitial pulmonary edema. Electronically Signed   By: Kristine Garbe M.D.   On: 02/12/2016 16:32    STUDIES:  CT abd 9/27 > Moderate size pericardial effusion. Correlation with echocardiogram recommended. Partially visualized moderate left pleural effusion with associated partial compressive atelectasis of the left lower lobe. Pneumonia is not excluded. CT head 9/27 >New Small RIGHT occipital lobe infarct, does not appear acute. Old RIGHT frontal subcortical infarct. Old bilateral thalamus and RIGHT basal ganglia infarcts.  CULTURES: 9/22 Urine > E.Coli  ANTIBIOTICS: Ceftraixone  SIGNIFICANT EVENTS: 9/22 admit severe urosepsis.  9/26 to SDU for recurrent hypotension refractory to IVF  LINES/TUBES:   DISCUSSION: 81 year old female admitted 9/22 for urosepsis treated with broad-spectrum antibiotics.  ASSESSMENT / PLAN:  PULMONARY A: Moderate L pleural effusion Hx asthma   P:   May eventually need thoracentesis once INR down Short term intubation only  CARDIOVASCULAR A:  AF RVR - now rate controlled Moderate pericardial effusion Hypotension > suspect at least partially due to pericardial effusion with narrow pulse pressure.  P:  Tele MAP > 51mHg May need pressors Will need to correct coagulopathy prior to any intervention as below Levophed to keep MAP > 641mHg, may ned to move to ICU if requires this. Doubt she is a candidate for pericardial window so will contact cardiology to consider pericardiocentesis.  Continue home amiodarone STAT echo DNR if arrests  RENAL A:   CKD stage V  P:   Not a candidate for dialysis per nephrology Follow BMP  GASTROINTESTINAL A:   No acute issues  P:   NPO for possible procedures Pepcid for SUP  HEMATOLOGIC A:   Warfarin coagulopathy Anemia, close to baseline but slowly trending down  P:  Will give FFP 3 units Consider [place]minist[place] if  acutely worsens Monitor Coags and CBC  INFECTIOUS A:   Urosepsis  P:   ABX day 6 (now ceftriaxone) Follow WBC and fever curve  ENDOCRINE A:   DM Hypothyroid  P:   CBG monitoring and SSI Continue home synthroid  NEUROLOGIC A:   Delirium  P:   Monitor  FAMILY  - Updates: updated patient at bedside and daughter via telephone.  - Inter-disciplinary family meet or Palliative Care meeting due by:  10/2   PaGeorgann HousekeeperAGACNP-BC [place]ology/Critical Care Pager [phone] ([phone]/27/[phone] 5:33 AM  Attending Note:  [age]ear old female with extensive PMH who presents to the hospital with UTI, AMS and septic shock.  PCCM consulted for hypotension.  Patient has acute on chronic renal failure and is not a dialysis candidate.  On exam, patient is alert and interactive, moving all  ext to command and lungs are clear.  I reviewed CXR myself, bilateral pleural effusion.  I spoke with the patient extensively, after discussion decision was made to make patient a full DNR.  Will order IV vitamin K incase patient is not absorbing PO vitamin K.  Check LFTs and an acute viral hepatitis panel.  No pressors, no central lines and no intubation.  The patient is critically ill with multiple organ systems failure and requires high complexity decision making for assessment and support, frequent evaluation and titration of therapies, application of advanced monitoring technologies and extensive interpretation of multiple databases.   Critical Care Time devoted to patient care services described in this note is  35  Minutes. This time reflects time of care of this signee Dr Jennet Maduro. This critical care time does not reflect procedure time, or teaching time or supervisory time of PA/NP/Med student/Med Resident etc but could involve care discussion time.  Rush Farmer, M.D. [facility] Pulmonary/Critical Care Medicine. Pager: [phone]. After hours pager: [phone].

## 2016-02-14 DIAGNOSIS — I5022 Chronic systolic (congestive) heart failure: Secondary | ICD-10-CM

## 2016-02-14 DIAGNOSIS — I5023 Acute on chronic systolic (congestive) heart failure: Secondary | ICD-10-CM | POA: Diagnosis present

## 2016-02-14 DIAGNOSIS — Z515 Encounter for palliative care: Secondary | ICD-10-CM

## 2016-02-14 DIAGNOSIS — I959 Hypotension, unspecified: Secondary | ICD-10-CM

## 2016-02-14 DIAGNOSIS — R0902 Hypoxemia: Secondary | ICD-10-CM

## 2016-02-14 LAB — GLUCOSE, CAPILLARY
GLUCOSE-CAPILLARY: 103 mg/dL — AB (ref 65–99)
GLUCOSE-CAPILLARY: 119 mg/dL — AB (ref 65–99)
Glucose-Capillary: 91 mg/dL (ref 65–99)
Glucose-Capillary: 166 mg/dL — ABNORMAL HIGH (ref 65–99)

## 2016-02-14 LAB — CBC
HCT: 31.8 % — ABNORMAL LOW (ref 36.0–46.0)
Hemoglobin: 8.9 g/dL — ABNORMAL LOW (ref 12.0–15.0)
MCH: 24.6 pg — AB (ref 26.0–34.0)
MCHC: 28 g/dL — AB (ref 30.0–36.0)
MCV: 87.8 fL (ref 78.0–100.0)
PLATELETS: 119 10*3/uL — AB (ref 150–400)
RBC: 3.62 MIL/uL — ABNORMAL LOW (ref 3.87–5.11)
RDW: 23.8 % — AB (ref 11.5–15.5)
WBC: 9.9 10*3/uL (ref 4.0–10.5)

## 2016-02-14 LAB — PREPARE FRESH FROZEN PLASMA
UNIT DIVISION: 0
Unit division: 0
Unit division: 0

## 2016-02-14 LAB — BASIC METABOLIC PANEL
Anion gap: 16 — ABNORMAL HIGH (ref 5–15)
BUN: 73 mg/dL — AB (ref 6–20)
CALCIUM: 7.3 mg/dL — AB (ref 8.9–10.3)
CO2: 26 mmol/L (ref 22–32)
CREATININE: 5.36 mg/dL — AB (ref 0.44–1.00)
Chloride: 101 mmol/L (ref 101–111)
GFR calc Af Amer: 8 mL/min — ABNORMAL LOW (ref >60)
GFR, EST NON AFRICAN AMERICAN: 7 mL/min — AB (ref >60)
GLUCOSE: 98 mg/dL (ref 65–99)
POTASSIUM: 3.8 mmol/L (ref 3.5–5.1)
SODIUM: 143 mmol/L (ref 135–145)

## 2016-02-14 LAB — HEPATITIS PANEL, ACUTE
HEP A IGM: NEGATIVE
HEP B C IGM: NEGATIVE
HEP B S AG: NEGATIVE

## 2016-02-14 LAB — PROTIME-INR
INR: 2.1
PROTHROMBIN TIME: 23.9 s — AB (ref 11.4–15.2)

## 2016-02-14 LAB — MAGNESIUM: MAGNESIUM: 1.8 mg/dL (ref 1.7–2.4)

## 2016-02-14 LAB — PHOSPHORUS: Phosphorus: 5 mg/dL — ABNORMAL HIGH (ref 2.5–4.6)

## 2016-02-14 MED ORDER — WARFARIN SODIUM 5 MG PO TABS
5.0000 mg | ORAL_TABLET | Freq: Once | ORAL | Status: AC
  Administered 2016-02-14: 5 mg via ORAL
  Filled 2016-02-14: qty 1

## 2016-02-14 MED ORDER — WARFARIN - PHARMACIST DOSING INPATIENT: Freq: Every day | Status: DC

## 2016-02-14 NOTE — Progress Notes (Signed)
PT Cancellation Note  Patient Details Name: [name] MRN: [id number] DOB: Feb 11, 1935   Cancelled Treatment:    Reason Eval/Treat Not Completed: Patient declined  Awakened patient and she immediately refused PT when I introduced myself. She first stated she wasn't interested in her lunch tray, however as I went to remove it she decided she did want to try to eat something. Patient eating sandwich and RN notified.   Tatyanna Cronk 02/14/2016, 2:44 PM Pager [phone]

## 2016-02-14 NOTE — Progress Notes (Signed)
Received cm consult from paliative care team. Pt from [facility] but fam not sure if they want pt to return or take pt home if they can get enough help. Both son and da work per paliative care note.  Pmt request lists for hhc and hospice agencies for family. No family in room and left hhc agency list and hospice agency list on bedside table. Will cont to follow.

## 2016-02-14 NOTE — Progress Notes (Signed)
PULMONARY / CRITICAL CARE MEDICINE   Name: [name] MRN: [id number] DOB: 06-14-1934    ADMISSION DATE:  02/08/2016 CONSULTATION DATE:  02/13/2016  REFERRING MD: Dr Hervey Ard  CHIEF COMPLAINT:  Hypotension  HISTORY OF PRESENT ILLNESS:   81 year old female with past medical history as below, which is significant for atrial fibrillation, chronic kidney disease stage V (she has been told she is a poor candidate for dialysis), diabetes, hypothyroidism, and stroke. She was recently admitted to [facility] September 1 through September 7 for atrial fibrillation with RVR, acute on chronic renal failure, and heart failure. She was thought to be a poor dialysis candidate and was eventually seen by palliative care and made a DO NOT RESUSCITATE and was discharged with plans for palliative medicine at the rehabilitation facility and eventually home hospice. She presented again to [facility] September 22 with complaints of altered mental status, somnolence, and hypertension. She was initially worked up as a severe sepsis case and treated with broad-spectrum antibiotics. She was also severely quite neuropathic with INR greater than 10 treated with vitamin K. Urine grew Escherichia coli and she was narrowed to Rocephin and then Keflex. Overall she had been improving and plans were for discharge on September 27, however September 26 she started complaining of right shoulder pain associated shortness of breath and dizziness. Heart rate increased to the 120s and she again became hypotensive with systolic blood pressures in the 90s. She transferred to the stepdown unit. Cause of hypotension at that point unclear. No clear evidence of bleeding noted despite INR which remained high. It was felt by the rounding team that she was hypovolemic and she was administered 4 L of crystalloid IV fluids over the next few hours, however, blood pressure failed to improve. PCCM consulted.  SUBJECTIVE:  No events overnight, no  new complaints.  VITAL SIGNS: BP 124/71   Pulse 100   Temp 97.6 F (36.4 C) (Oral)   Resp 13   Ht 5\' 7"  (1.702 m)   Wt 72.3 kg (159 lb 6.3 oz)   SpO2 94%   BMI 24.96 kg/m   HEMODYNAMICS:    VENTILATOR SETTINGS:    INTAKE / OUTPUT: I/O last 3 completed shifts: In: 5683.3 [P.O.:360; I.V.:3939.6; Blood:783.7; IV [id number] Out: 300 [Urine:300]  PHYSICAL EXAMINATION: General:  Elderly female Neuro:  Alert, oriented x 3 HEENT:  [place]/AT, PERRL, no JVD Cardiovascular:  Muffled, IRIR Lungs:  clear Abdomen:  Soft, non-tender, non-distended Musculoskeletal:  No acute deformity or ROM limitation Skin:  Grossly intact  LABS:  BMET  Recent Labs Lab 02/12/16 0524 02/13/16 0232 02/14/16 0251  NA 142 142 143  K 4.1 3.9 3.8  CL 89* 99* 101  CO2 36* 30 26  BUN 87* 79* 73*  CREATININE 5.76* 5.44* 5.36*  GLUCOSE 145* 154* 98    Electrolytes  Recent Labs Lab 02/09/16 0507  02/11/16 0555 02/12/16 0524 02/13/16 0232 02/14/16 0251  CALCIUM 7.7*  < > 7.7* 8.2* 7.3* 7.3*  MG 1.9  --  2.0  --   --  1.8  PHOS  --   --   --   --   --  5.0*  < > = values in this interval not displayed.  CBC  Recent Labs Lab 02/12/16 2124 02/13/16 0232 02/14/16 0251  WBC 10.8* 9.6 9.9  HGB 8.7* 9.1* 8.9*  HCT 30.6* 33.1* 31.8*  PLT 119* 108* 119*    Coag's  Recent Labs Lab 02/13/16 0232 02/13/16 1129 02/14/16 0251  INR >10.00* 3.05 2.10    Sepsis Markers  Recent Labs Lab 02/08/16 1208 02/12/16 1834 02/12/16 2124  LATICACIDVEN 1.83 1.9 1.7    ABG No results for input(s): PHART, PCO2ART, PO2ART in the last 168 hours.  Liver Enzymes  Recent Labs Lab 02/08/16 1150 02/13/16 1129  AST 22 20  ALT 10* 11*  ALKPHOS 80 96  BILITOT 1.5* 0.8  ALBUMIN 2.6* 2.6*    Cardiac Enzymes  Recent Labs Lab 02/08/16 2211 02/09/16 0507 02/12/16 1442  TROPONINI 0.64* 0.56* 0.24*    Glucose  Recent Labs Lab 02/11/16 1319 02/13/16 0803 02/13/16 1210  02/13/16 1833 02/13/16 2222 02/14/16 0818  GLUCAP 163* 139* 213* 139* 125* 91    Imaging No results found.  STUDIES:  CT abd 9/27 > Moderate size pericardial effusion. Correlation with echocardiogram recommended. Partially visualized moderate left pleural effusion with associated partial compressive atelectasis of the left lower lobe. Pneumonia is not excluded. CT head 9/27 >[place] RIGHT occipital lobe infarct, does not appear acute. Old RIGHT frontal subcortical infarct. Old bilateral thalamus and RIGHT basal ganglia infarcts.  CULTURES: 9/22 Urine > E.Coli  ANTIBIOTICS: Ceftraixone  SIGNIFICANT EVENTS: 9/22 admit severe urosepsis.  9/26 to SDU for recurrent hypotension refractory to IVF  LINES/TUBES:  I reviewed CXR myself, pleural effusion noted  DISCUSSION: 81 year old female admitted 9/22 for urosepsis treated with broad-spectrum antibiotics.  ASSESSMENT / PLAN:  PULMONARY A: Moderate L pleural effusion Hx asthma  Hypoxemia  P:   May eventually need thoracentesis but that is not an issue at this time, would not perform Titrate O2 for sat of 88-92%  CARDIOVASCULAR A:  AF RVR - now rate controlled Moderate pericardial effusion Hypotension > suspect at least partially due to pericardial effusion with narrow pulse pressure.  P:  Tele MAP > 48mmHg Continue home amiodarone Echo per cards No need for pressors or further fluid resuscitation.  DNR if arrests  RENAL A:   CKD stage V  P:   Not a candidate for dialysis per nephrology Follow BMP Recommend involvement of palliative care  GASTROINTESTINAL A:   No acute issues  P:   Diet per orders Pepcid for SUP  HEMATOLOGIC A:   Warfarin coagulopathy Anemia, close to baseline but slowly trending down  P:  Will give FFP 3 units Consider Cornelius administration if acutely worsens Monitor Coags and CBC  INFECTIOUS A:   Urosepsis  P:   ABX day 7 (now ceftriaxone) Follow WBC and fever  curve  ENDOCRINE A:   DM Hypothyroid  P:   CBG monitoring and SSI Continue home synthroid  NEUROLOGIC A:   Delirium  P:   Monitor No sedating medications.  Discussed with PCCM-NP, PCCM will sign off, please call back if needed.  Rush Farmer, M.D. [facility] Pulmonary/Critical Care Medicine. Pager: [phone]. After hours pager: [phone].

## 2016-02-14 NOTE — Progress Notes (Signed)
Subjective:   Objective:  Vital signs in last 24 hours: Vitals:   02/14/16 1000 02/14/16 1100 02/14/16 1155 02/14/16 1200  BP: 121/81 124/71 117/67 119/78  Pulse: (!) 103 100 98 97  Resp: 14 13 15 14   Temp:   97.8 F (36.6 C)   TempSrc:   Oral   SpO2: 93% 94% 100% 99%  Weight:      Height:       Filed Weights   02/12/16 2200 02/13/16 0330 02/14/16 0500  Weight: 171 lb 8.3 oz (77.8 kg) 171 lb 8.3 oz (77.8 kg) 159 lb 6.3 oz (72.3 kg)    Physical Exam  Constitutional: She is oriented to person, place, and time.  Frail, elderly woman in no distress, sitting in bed eating breakfast  Eyes: Conjunctivae are normal. No scleral icterus.  Cardiovascular:  Tachycardic, irregularly irregular rhythm, normal [id number], 2/6 systolic murmur Hands and feet warm  Pulmonary/Chest:  Normal work of breathing, good air movement, reduced breath sounds in lower lung fields  Abdominal: Soft. She exhibits no distension. There is no tenderness.  Musculoskeletal: She exhibits no edema or tenderness.  Neurological: She is alert and oriented to person, place, and time.  Skin: Skin is warm and dry.  Psychiatric: She has a normal mood and affect. Her behavior is normal.   CBC Latest Ref Rng & Units 02/14/2016 02/13/2016 02/12/2016  WBC 4.0 - 10.5 K/uL 9.9 9.6 10.8(H)  Hemoglobin 12.0 - 15.0 g/dL 8.9(L) 9.1(L) 8.7(L)  Hematocrit 36.0 - 46.0 % 31.8(L) 33.1(L) 30.6(L)  Platelets 150 - 400 K/uL 119(L) 108(L) 119(L)   BMP Latest Ref Rng & Units 02/14/2016 02/13/2016 02/12/2016  Glucose 65 - 99 mg/dL 98 154(H) 145(H)  BUN 6 - 20 mg/dL 73(H) 79(H) 87(H)  Creatinine 0.44 - 1.00 mg/dL 5.36(H) 5.44(H) 5.76(H)  BUN/Creat Ratio 11 - 26 - - -  Sodium 135 - 145 mmol/L 143 142 142  Potassium 3.5 - 5.1 mmol/L 3.8 3.9 4.1  Chloride 101 - 111 mmol/L 101 99(L) 89(L)  CO2 22 - 32 mmol/L 26 30 36(H)  Calcium 8.9 - 10.3 mg/dL 7.3(L) 7.3(L) 8.2(L)   Lab Results  Component Value Date   INR 2.10 02/14/2016   INR 3.05  02/13/2016   INR >10.00 (HH) 02/13/2016   Assessment/Plan:  Principal Problem:   UTI (lower urinary tract infection) Active Problems:   Hypothyroidism   Hyperlipidemia   CKD (chronic kidney disease), stage V (HCC)   Atrial fibrillation (HCC)   Anemia associated with chronic renal failure   External hemorrhoids   Acute kidney injury superimposed on chronic kidney disease (HCC)   Dehydration   Sepsis (HCC)   Supratherapeutic INR   E. coli sepsis (HCC)   Hypotension   Pleural effusion   Chronic systolic heart failure (HCC)   Hypoxemia  #Hypotension Resolved.  Unclear cause of her clinical deterioration on 9/26, but now back to baseline.  EF 20-25%, down from 30-35% at the beginning of this month and small pericardial effusion without tamponade.  Moderate left pleural effusion which was present on admission but may be somewhat increased. CT head, A/P with no hemorrhage.  No lactic acidosis.  She was given 4.5L crystalloid without much improvement in BPs.  Possible etiologies are cardiogenic shock due to worsening systolic function following gram negative bacteremia, or recurrent infection.   #UTI #E Coli bacteremia She was treated with IV antibiotics (zosyn, then ceftriaxone) for 3 days, and responded well with clinical improvement and resolving leukocytosis.  She  was then switched to oral cephalexin on 9/25 after culture sensitivities showed pan-sensitive E Coli.  On 9/26, she had clinical deterioration with hypotension and increased WBCs.  She was switched back to IV ceftriaxone on 9/26 evening. -D/c cephalexin -Resume ceftriaxone for 7 days Abx (last 9/29)  #Supratherapeutic INR On warfarin for AF stroke prophylaxis, INR goal 2-3, presented supratherapeutic with INR >10.  Given total of 15 mg PO Vit K with minimal effect on INR.  She was transfused with 3U FFP given 10 mg IV Vitamin K and her INR rapidly dropped.  Unclear why refractory INR precipitously dropped.  May have poor or  delayed absorption of enteric Vit K. -Daily INR -Pharmacy consult for warfarin management  #AF with RVR Rates moderately well controlled 100-110.  Metoprolol decreased to 25 mg daily (from 75 mg at home) on admission with concern for sepsis, increased to 50 mg and she became mildly hypotensive, and discontinued with hypotension on 9/26.  Rates now controlled on amiodarone alone. -Amiodarone 200 mg BID -Hold metoprolol  #CHF Pulmonary but no peripheral edema, hypotensive at presentation but now normotensive after 2L and antibiotics. Good oxygenation on room air.  Volume status is precarious with CHF, CKD, and concern for sepsis.  Weight stable.  EF 20-25%, down from 30-35% one month ago.  Diuretics held from 9/26 on. -Consider restart diuretics tomorrow -Fluid restrict 1200 mL -I/Os and daily weights  #Acute on Chronic Kidney Disease Not a dialysis candidate.  Acute decrease in renal function likely prerenal secondary to dehydration and infection.  Creatine now back near baseline. -Daily BMPs -I/Os and daily weights -continue home calcimimetic, PO bicarb  #Elevated Troponins Resolved.  She has no chest pain or dyspnea, but has elevated troponins and possible new ischemic EKG changes.  Her elevated cardiac markers likely represent demand ischemia in the setting of infection.  She is currently anticoagulated with supratherapeutic INR, so unless STEMI emerges no further treatment would be available even if occlusive disease.  #Hypokalemia Resolved with PO KCl.  Normal Mg.  Dispo: Anticipated discharge in approximately 3 day(s).   Minus Liberty, MD 02/14/2016, 12:53 PM Pager: [phone]

## 2016-02-14 NOTE — Consult Note (Signed)
Consultation Note Date: 02/14/2016   Patient Name: [name]  DOB: Oct 17, 1934  MRN: [id number]  Age / Sex: 81 y.o., female  PCP: [name], MD Referring Physician: Annia Belt, MD  Reason for Consultation: Disposition and Other  HPI/Patient Profile: 81 y.o. female  with past medical history of systolic heart failure, recurrent pleural effusion, MGUS, CKD 3-4, Afib (on coumadin) and CVA  Who was admitted on 02/08/2016 with hypotension and chest pain.  She was found to have E-coli bacteremia along with worsening of her heart failure (EF 20 - 25%) and acute on chronic kidney failure (GFR 8).   Clinical Assessment and Goals of Care:  PMT was consulted at the family's request.  I met with the patient and her daughter at bedside.  The patient was attempting to eat breakfast.  She was pleasant and talkative.  Her daughter, [name] and I went to a conference room and added her brother, Ronalee Belts, by phone.  Ronalee Belts and [name] had been told by an nurse at [facility] that [place] would come to the patients home to stay with her from 9 to 5 daily.  Once I explained to the family that we will come to the patient's home once every 6 weeks to assess symptoms and work to increase the patient's quality of life the conversation was essentially over.  [name] explained that they were dissatisfied with nursing homes and wanted to take their mother home to care for her.  Both she and her brother work full time.  We discussed Hospice Services but [name] and Ronalee Belts have not accepted their mother's current state in life and are pushing for continued aggressive medical care.  We talked briefly about their mother's infection, her inability to walk, her end stage cardiac and renal failure, but the family politely dis-engaged in the conversation.  I debriefed with Dr. Inda Castle.     Patient is deferring decisions regarding  her health care to her children.  SUMMARY OF RECOMMENDATIONS    Code Status/Advance Care Planning:  DNR    Symptom Management:   Well managed by primary team   Additional Recommendations (Limitations, Scope, Preferences):  PMT will continue to follow peripherally and look for an opportunity to re-engage with the family next week if the patient is still in-house.  I will request that Case Management provide the family with a list of home health Aides and Agencies including Hospice Services.   Psycho-social/Spiritual:   Desire for further Chaplaincy support:  Family is well supported by their own church.  Additional Recommendations: Caregiving  Support/Resources  Prognosis:   < 6 months given advanced heart failure and end stage kidney failure.  Not a hemodialysis candidate.  Discharge Planning: To Be Determined.  Family would like to take her home if they can gather the resources to care for her.      Primary Diagnoses: Present on Admission: . UTI (lower urinary tract infection) . Anemia associated with chronic renal failure . Atrial fibrillation ([place]) . External hemorrhoids . Hypothyroidism .  Hyperlipidemia . CKD (chronic kidney disease), stage V ([place])   I have reviewed the medical record, interviewed the patient and family, and examined the patient. The following aspects are pertinent.  Past Medical History:  Diagnosis Date  . Anemia    b12 def, iron def, follow at cancer center, gets B12 and  another injection there. Could not remeber the name. Dr. Ralene Ok is her  cancer doctor  . Asthma   . Atrial fibrillation ([place])   . Blood transfusion    two or more yrs ago  . Chronic renal insufficiency   . Chronic venous insufficiency   . Diabetes mellitus   . Hyperlipidemia   . Hypertension   . Hypothyroidism    thyroid removed 4 or more yrs ago  . MGUS (monoclonal gammopathy of unknown significance)   . Osteomyelitis ([place])    s/p Rt 2nd toe and left 5 toes   amputation in 1/12  by Dr. Sharol Given  . Stroke ([place])   . Thyroid disease    hypothyroidism h/o hyperthyroidism s/p ablation/ectomy   Social History   Social History  . Marital status: Widowed    Spouse name: N/A  . Number of children: N/A  . Years of education: N/A   Social History Main Topics  . Smoking status: Never Smoker  . Smokeless tobacco: Never Used  . Alcohol use No  . Drug use: No  . Sexual activity: Not Asked   Other Topics Concern  . None   Social History Narrative  . None   Family History  Problem Relation Age of Onset  . Anemia Mother   . Colon cancer Mother     rectal  . HIV Brother   . Cancer Brother    Scheduled Meds: . amiodarone  200 mg Oral BID  . atorvastatin  40 mg Oral Daily  . calcitRIOL  0.25 mcg Oral Q M,W,F  . cefTRIAXone (ROCEPHIN)  IV  2 g Intravenous Q24H  . dextrose  1 Tube Oral Once  . guaiFENesin  600 mg Oral BID  . hydrocortisone  1 application Rectal BID  . insulin aspart  0-9 Units Subcutaneous TID WC  . levothyroxine  112 mcg Oral QAC breakfast  . pantoprazole  40 mg Oral Daily  . sodium bicarbonate  1,300 mg Oral TID  . sodium chloride flush  3 mL Intravenous Q12H   Continuous Infusions: . norepinephrine (LEVOPHED) Adult infusion     PRN Meds:.acetaminophen **OR** acetaminophen, dextrose, ondansetron, senna-docusate, witch hazel-glycerin Medications Prior to Admission:  Prior to Admission medications   Medication Sig Start Date End Date Taking? Authorizing Provider  acetaminophen (TYLENOL) 325 MG tablet Take 2 tablets (650 mg total) by mouth every 6 (six) hours as needed for mild pain (or Fever >/= 101). Patient taking differently: Take 650 mg by mouth every 6 (six) hours as needed (pain/ fever >101).  01/25/16  Yes Alexa Angela Burke, MD  amiodarone (PACERONE) 200 MG tablet Take 1 tablet (200 mg total) by mouth 2 (two) times daily. Patient taking differently: Take 200 mg by mouth See admin instructions. Take 1 tablet ( 200 mg) by  mouth twice daily unless SBP <110 01/25/16  Yes Alexa Angela Burke, MD  atorvastatin (LIPITOR) 40 MG tablet TAKE 1 TABLET BY MOUTH EVERY DAY Patient taking differently: TAKE 1 TABLET BY MOUTH EVERY DAY AT 6PM 08/01/15  Yes Bartholomew Crews, MD  calcitRIOL (ROCALTROL) 0.25 MCG capsule Take 1 capsule by mouth every Monday, Wednesday, and Friday. 04/10/14  Yes Historical Provider, MD  cetirizine (ZYRTEC) 10 MG chewable tablet Chew 1 tablet (10 mg total) by mouth daily. 09/19/15  Yes Carly Montey Hora, MD  Cholecalciferol (VITAMIN D3) 10000 UNITS capsule Take 1 capsule (10,000 Units total) by mouth once a week. Patient taking differently: Take 10,000 Units by mouth every Tuesday.  08/04/14  Yes [name], MD  clotrimazole (LOTRIMIN AF) 1 % cream Apply 1 application topically 2 (two) times daily.   Yes Historical Provider, MD  furosemide (LASIX) 80 MG tablet Take 1 tablet (80 mg total) by mouth 2 (two) times daily. Patient taking differently: Take 80 mg by mouth See admin instructions. Take 1 tablet (80 mg) by mouth at 9am and 5pm unless SBP <110 01/25/16  Yes Alexa Angela Burke, MD  glipiZIDE (GLUCOTROL) 5 MG tablet Take 5 mg by mouth 2 (two) times daily with a meal.   Yes Historical Provider, MD  hydrocortisone (ANUSOL-HC) 2.5 % rectal cream Place 1 application rectally 2 (two) times daily. 01/25/16  Yes Alexa Angela Burke, MD  JANUVIA 25 MG tablet TAKE 1 TABLET BY MOUTH EVERY DAY 09/05/15  Yes [name], MD  levothyroxine (SYNTHROID, LEVOTHROID) 112 MCG tablet TAKE 1 TABLET(112 MCG) BY MOUTH DAILY BEFORE BREAKFAST 10/24/15  Yes [name], MD  lidocaine (LMX) 4 % cream Apply topically 2 (two) times daily as needed (rectal pain). Patient taking differently: Apply 1 application topically 2 (two) times daily as needed (rectal pain).  01/25/16  Yes Alexa Angela Burke, MD  Magnesium 400 MG TABS Take 400 mg by mouth daily.   Yes Historical Provider, MD  metolazone (ZAROXOLYN) 5 MG tablet Take 1 tablet (5 mg total) by mouth  daily. 30 minutes before AM lasix Patient taking differently: Take 5 mg by mouth See admin instructions. Take 1 tablet (5 mg) by mouth daily 30 minutes before lasix unless SBP <110 01/25/16  Yes Alexa Angela Burke, MD  metoprolol succinate (TOPROL-XL) 25 MG 24 hr tablet Take 3 tablets (75 mg total) by mouth daily. Patient taking differently: Take 75 mg by mouth See admin instructions. Take 3 tablets (75 mg) by mouth daily unless SBP <110 01/25/16 02/24/16 Yes Alexa Angela Burke, MD  pantoprazole (PROTONIX) 40 MG tablet TAKE 1 TABLET BY MOUTH EVERY DAY Patient taking differently: TAKE 1 TABLET BY MOUTH EVERY DAY BEFORE BREAKFAST 07/07/15  Yes [name], MD  polyethylene glycol (MIRALAX / GLYCOLAX) packet Take 17 g by mouth daily as needed for mild constipation or moderate constipation. Mix in 8 oz liquid and drink   Yes Historical Provider, MD  PRESCRIPTION MEDICATION Vitamin D injection monthly at cancer center   Yes Historical Provider, MD  sennosides-docusate sodium (SENOKOT-S) 8.6-50 MG tablet Take 2 tablets by mouth daily as needed for constipation.    Yes Historical Provider, MD  sodium bicarbonate 650 MG tablet Take 2 tablets (1,300 mg total) by mouth 3 (three) times daily. 01/25/16 02/24/16 Yes Alexa Angela Burke, MD  warfarin (COUMADIN) 5 MG tablet Take 1 tablet (5 mg total) by mouth daily. Patient taking differently: Take 5 mg by mouth at bedtime.  01/25/16 02/24/16 Yes Minus Liberty, MD  witch hazel-glycerin (TUCKS) pad Apply topically as needed for itching. Patient taking differently: Apply 1 application topically as needed (rectal itching).  01/25/16  Yes Alexa Angela Burke, MD  zinc oxide (BALMEX) 11.3 % CREA cream Apply 1 application topically 2 (two) times daily.   Yes Historical Provider, MD  Blood Glucose Monitoring Suppl [facility]  VERIO FLEX SYSTEM) W/DEVICE KIT 1 each by Does not apply route every morning. Check blood sugar one time each day 05/07/15   [name], MD  clotrimazole (LOTRIMIN) 1 % cream  Apply 1 application topically 2 (two) times daily. Patient not taking: Reported on 02/08/2016 09/19/15   Sindy Guadeloupe Rivet, MD  glipiZIDE (GLUCOTROL) 10 MG tablet Take 0.5 tablets (5 mg total) by mouth 2 (two) times daily with a meal. Patient not taking: Reported on 02/08/2016 08/29/15   [name], MD  glucose blood [facility] VERIO) test strip Check blood sugar one time each day 05/07/15   [name], MD  [facility] DELICA LANCETS FINE MISC Check blood sugar one time each day 05/07/15   [name], MD   No Known Allergies Review of Systems:  Patient denies CP, SOB, pain.  She denies further bleeding.    Physical Exam  Frail elderly female, NAD, A&O interactive.  Attempting to eat breakfast. Resp NAD   Vital Signs: BP 121/81   Pulse (!) 103   Temp 97.6 F (36.4 C) (Oral)   Resp 14   Ht 5' 7"  (1.702 m)   Wt 72.3 kg (159 lb 6.3 oz)   SpO2 93%   BMI 24.96 kg/m  Pain Assessment: No/denies pain   Pain Score: 0-No pain   SpO2: SpO2: 93 % O2 Device:SpO2: 93 % O2 Flow Rate: .O2 Flow Rate (L/min): 2 L/min  IO: Intake/output summary:  Intake/Output Summary (Last 24 hours) at 02/14/16 1053 Last data filed at 02/14/16 1000  Gross per 24 hour  Intake             3038 ml  Output              225 ml  Net             2813 ml    LBM: Last BM Date: 02/14/16 Baseline Weight: Weight: 77.4 kg (170 lb 10.2 oz) Most recent weight: Weight: 72.3 kg (159 lb 6.3 oz)     Palliative Assessment/Data:   Flowsheet Rows   Flowsheet Row Most Recent Value  Intake Tab  Unit at Time of Referral  Intermediate Care Unit  Palliative Care Primary Diagnosis  Cardiac  Date Notified  02/14/16  Palliative Care Type  New Palliative care  Reason for referral  Other (Comment)  Date of Admission  02/08/16  Date first seen by Palliative Care  02/14/16  # of days Palliative referral response time  0 Day(s)  # of days IP prior to Palliative referral  6  Clinical Assessment  Psychosocial & Spiritual  Assessment  Palliative Care Outcomes  Patient/Family meeting held?  Yes  Who was at the meeting?  patient, daughter and son  Palliative Care Outcomes  Other (Comment)      Time In: 10:45   Time Out: 11:15 Time Total: 30 min.  Greater than 50%  of this time was spent counseling and coordinating care related to the above assessment and plan.  Signed by: Melton Alar, PA-C   Please contact Palliative Medicine Team phone at [phone] for questions and concerns.  For individual provider: See Shea Evans

## 2016-02-14 NOTE — Care Management Note (Signed)
Case Management Note  Patient Details  Name: [name] MRN: [id number] Date of Birth: 09/12/1934  Subjective/Objective:     Adm w uti               Action/Plan:pt from sw for snf   Expected Discharge Date:                  Expected Discharge Plan:  [place]  In-House Referral:  Clinical Social Work, Hospice / Palliative Care  Discharge planning Services     Post Acute Care Choice:    Choice offered to:     DME Arranged:    DME Agency:     HH Arranged:    HH Agency:     Status of Service:     If discussed at [employer], dates discussed:    Additional Comments:sw involved. Pal care to see pt and fam.  Lacretia Leigh, RN 02/14/2016, 11:09 AM

## 2016-02-14 NOTE — Progress Notes (Signed)
ANTICOAGULATION CONSULT NOTE  Pharmacy Consult for Coumadin Indication: atrial fibrillation   Assessment: 82 yo female with AFib and supratherapeutic INR on admission.   INR now down to 2 after a total of 15mg  of vitamin k given and 3 units of FFP. INR has been slow to respond but given trend overnight expect it to continue to drop further in the morning. D/w physician and will continue warfarin dosing at this time. I will be conservative with dosing tonight given recent coagulapathy.   No bleeding noted, head CT negative. Hg is stable and pltc wnl. No s/sx bleeding.  Goal of Therapy:  INR 2-3 Monitor platelets by anticoagulation protocol: Yes   Plan:  Resume warfarin 5mg  tonight Continue Daily INR Watch for bleeding  No Known Allergies  Patient Measurements: Height: 5\' 7"  (170.2 cm) Weight: 159 lb 6.3 oz (72.3 kg) IBW/kg (Calculated) : 61.6   Vital Signs: Temp: 97.8 F (36.6 C) (09/28 1155) Temp Source: Oral (09/28 1155) BP: 119/78 (09/28 1200) Pulse Rate: 97 (09/28 1200)  Labs:  Recent Labs  02/12/16 0524 02/12/16 1442  02/12/16 2124 02/13/16 0232 02/13/16 1129 02/14/16 0251  HGB 10.6*  --   < > 8.7* 9.1*  --  8.9*  HCT 37.7  --   < > 30.6* 33.1*  --  31.8*  PLT 161  --   < > 119* 108*  --  119*  LABPROT 70.9*  --   --   --  89.5* 32.2* 23.9*  INR 8.17*  --   --   --  >10.00* 3.05 2.10  CREATININE 5.76*  --   --   --  5.44*  --  5.36*  TROPONINI  --  0.24*  --   --   --   --   --   < > = values in this interval not displayed.  Estimated Creatinine Clearance: 8.4 mL/min (by C-G formula based on SCr of 5.36 mg/dL (H)).  Erin Hearing PharmD., BCPS Clinical Pharmacist Pager [phone] 02/14/2016 12:51 PM

## 2016-02-15 DIAGNOSIS — I5082 Biventricular heart failure: Secondary | ICD-10-CM

## 2016-02-15 LAB — PROTIME-INR
INR: 2.42
PROTHROMBIN TIME: 26.8 s — AB (ref 11.4–15.2)

## 2016-02-15 LAB — GLUCOSE, CAPILLARY
GLUCOSE-CAPILLARY: 149 mg/dL — AB (ref 65–99)
Glucose-Capillary: 79 mg/dL (ref 65–99)
Glucose-Capillary: 116 mg/dL — ABNORMAL HIGH (ref 65–99)
Glucose-Capillary: 163 mg/dL — ABNORMAL HIGH (ref 65–99)

## 2016-02-15 LAB — CULTURE, BLOOD (ROUTINE X 2)
Culture: NO GROWTH
Culture: NO GROWTH

## 2016-02-15 LAB — BASIC METABOLIC PANEL
ANION GAP: 14 (ref 5–15)
BUN: 68 mg/dL — AB (ref 6–20)
CALCIUM: 7.3 mg/dL — AB (ref 8.9–10.3)
CO2: 27 mmol/L (ref 22–32)
Chloride: 100 mmol/L — ABNORMAL LOW (ref 101–111)
Creatinine, Ser: 5.57 mg/dL — ABNORMAL HIGH (ref 0.44–1.00)
GFR calc Af Amer: 8 mL/min — ABNORMAL LOW (ref >60)
GFR, EST NON AFRICAN AMERICAN: 7 mL/min — AB (ref >60)
Glucose, Bld: 103 mg/dL — ABNORMAL HIGH (ref 65–99)
POTASSIUM: 3.4 mmol/L — AB (ref 3.5–5.1)
SODIUM: 141 mmol/L (ref 135–145)

## 2016-02-15 LAB — CBC
HCT: 31.5 % — ABNORMAL LOW (ref 36.0–46.0)
Hemoglobin: 8.7 g/dL — ABNORMAL LOW (ref 12.0–15.0)
MCH: 24.2 pg — AB (ref 26.0–34.0)
MCHC: 27.6 g/dL — ABNORMAL LOW (ref 30.0–36.0)
MCV: 87.5 fL (ref 78.0–100.0)
PLATELETS: 117 10*3/uL — AB (ref 150–400)
RBC: 3.6 MIL/uL — AB (ref 3.87–5.11)
RDW: 23.4 % — AB (ref 11.5–15.5)
WBC: 8.9 10*3/uL (ref 4.0–10.5)

## 2016-02-15 MED ORDER — WARFARIN SODIUM 2 MG PO TABS
1.0000 mg | ORAL_TABLET | Freq: Once | ORAL | Status: AC
  Administered 2016-02-15: 1 mg via ORAL
  Filled 2016-02-15 (×2): qty 0.5

## 2016-02-15 MED ORDER — POTASSIUM CHLORIDE CRYS ER 20 MEQ PO TBCR
20.0000 meq | EXTENDED_RELEASE_TABLET | Freq: Two times a day (BID) | ORAL | Status: DC
  Administered 2016-02-15 – 2016-02-19 (×9): 20 meq via ORAL
  Filled 2016-02-15 (×9): qty 1

## 2016-02-15 MED ORDER — FUROSEMIDE 20 MG PO TABS
20.0000 mg | ORAL_TABLET | Freq: Two times a day (BID) | ORAL | Status: DC
  Administered 2016-02-15 – 2016-02-19 (×8): 20 mg via ORAL
  Filled 2016-02-15 (×8): qty 1

## 2016-02-15 MED ORDER — POTASSIUM CHLORIDE CRYS ER 20 MEQ PO TBCR
20.0000 meq | EXTENDED_RELEASE_TABLET | Freq: Once | ORAL | Status: AC
  Administered 2016-02-15: 20 meq via ORAL
  Filled 2016-02-15: qty 1

## 2016-02-15 NOTE — Progress Notes (Signed)
Family has decided to take Patient home at discharge. RN Case Manager assisting family with home health needs and medical equipment. CSW signing off. Please contact if new need(s) arise.          Emiliano Dyer, LCSW [facility] ED/91M Clinical Social Worker [phone]

## 2016-02-15 NOTE — Progress Notes (Signed)
Medicine attending discharge note: I personally examined this patient today together with resident physician Dr. Minus Liberty and I concur with his discharge evaluation and plan recorded in his progress note dictated 02/15/2016 which we reviewed together.  Clinical summary: Pleasant 81 year old woman with chronic congestive heart failure, ejection fraction 40%, chronic atrial fibrillation, on chronic warfarin anticoagulation, type 2 diabetes on oral agent, stage V kidney disease not felt to be a dialysis candidate, admitted on September 22 for increasing lethargy, decrease in her baseline mental status, and decreased oral intake. She admitted to increased urinary frequency. She was initially found to be hypotensive with blood pressure in the 80s. Urine showed too numerous to count white cells and many bacteria. Troponin levels borderline elevated with no acute ischemic change on EKG. She was supratherapeutic on her Coumadin with INR greater than 10. No signs of active bleeding. She received  vitamin K alone initially.   Hospital course:. She did have a prompt response to antibiotic therapy with rapid control of infection. Her blood pressures remained low. Her INR remained elevated despite multiple doses of vitamin K. She had a setback on September 26 with a little sense of malaise and further decrease in her blood pressure down to 70 mm systolic. No acute changes on electrocardiogram. No rise in troponin. However, echocardiogram done on September 27 showed further decrement in her systolic function now estimated at 20-25 percent. She also had severe reduction in her right ventricular systolic function and associated moderate tricuspid valve regurgitation. A small pericardial effusion and a moderate size left pleural effusion noted. Although hemoglobin was overall stable and we did not suspect a hemorrhagic pleural effusion, attempts were made to more rapidly reverse her warfarin with additional  vitamin K given IV as well as plasma transfusion at the advice of critical care consultant. CT scan of the abdomen and pelvis showed no intra-abdominal hemorrhage. We were able to get her prothrombin time down into the therapeutic range. INR 2.4 with pro time 26.8 seconds on day of discharge September 29 and we will reinstitute warfarin at a low dose of 2.5 mg daily. She has stage V kidney disease. She is not a dialysis candidate. Renal function remained at her baseline with BUN 68 and creatinine 5.6. She received 7 days of parenteral antibiotics for her Escherichia coli sepsis except for 1 day when she was temporarily transitioned to oral antibiotics on the third hospital day. IVs antibiotics were resumed when we did not have an explanation for her sudden decompensation to cover the possibility that this was recrudescence of her infection.  Overall her performance status is severely compromised with a now ECOG performance status of 3-4. Palliative care and met with the patient's family and in addition, our medical team had a number of discussions with the family. Patient expressed a strong desire to go home and family was more than willing to accommodate her wishes.  Disposition: Condition overall stable at time of discharge but overall unstable with respect to refractory to medical problems outlined above. She will return home. We will arrange for home nursing and physical therapy services and whatever durable medical equipment she needs. She will follow-up in our Gen. medicine clinic No complications other than those related to her primary illness. It will be very important to make sure she has frequent pro time/INR checks if she is going to remain on warfarin and that results of lab testing get forwarded expeditiously to her managing physicians.

## 2016-02-15 NOTE — Progress Notes (Addendum)
Spoke w pt and da. They have decided at disch they will return home [facility]. They have w/c-walker-bsc. They would like to get hosp bed w rails and elect w/c. Will ask md for orders. expained that elect w/c takes time but need to start w order. Will cont to follow.pt has had caps program and da has called caps about restarting this for aid help at home.fam would like palitive care rn to ck on pt. Ref to paliative care of [place].

## 2016-02-15 NOTE — Progress Notes (Signed)
Physical Therapy Treatment Patient Details Name: [name] MRN: [id number] DOB: [birth year] Today's Date: 02/15/2016    History of Present Illness 81 y.o. female admitted with UTI. PMH: CHF, AFib, DM, stroke, bilateral transtarsal amputations.    PT Comments    Noted change in discharge plans. Appears pt/family have chosen not to use Hospice at this time and therefore patient is eligible for HHPT. Patient willing to attempt OOB today. She is accustomed to using a 4 wheel walker and did not want to use our 2 wheel variety. Used stedy lift (requires pt to stand twice, she sits on a seat that swings under her, pivot/roll entire lift from bed to chair, and stand to sit into chair). Patient stood well and should be able to progress to stand pivot with assist +/- rolling walker soon.    Follow Up Recommendations  Supervision for mobility/OOB;Home health PT (family wants to take pt home; ?Hospice involved)     Equipment Recommendations  Hospital bed (? hoyer lift)    Recommendations for Other Services       Precautions / Restrictions Precautions Precautions: Fall Precaution Comments: pt requests use of her shoes (with orthotics s/p forefoot amputations) for standing/ambulation Restrictions Weight Bearing Restrictions: No    Mobility  Bed Mobility Overal bed mobility: Needs Assistance Bed Mobility: Supine to Sit     Supine to sit: Min assist;HOB elevated     General bed mobility comments: pt moves slowly and did not need assist until she was sitting and feet did not reach the floor (assist to scoot hips forward  Transfers Overall transfer level: Needs assistance   Transfers: Sit to/from Stand Sit to Stand: Min assist;From elevated surface         General transfer comment: x2 from elevated bed and then from elevated seat of stedy  Ambulation/Gait                 Stairs            Wheelchair Mobility    Modified Rankin (Stroke Patients Only)        Balance     Sitting balance-Leahy Scale: Fair     Standing balance support: Bilateral upper extremity supported Standing balance-Leahy Scale: Poor Standing balance comment: stood for pericare with bil UE support on stedy x 2 minutes                    Cognition Arousal/Alertness: Awake/alert Behavior During Therapy: Flat affect Overall Cognitive Status: Within Functional Limits for tasks assessed (slow response to questions)                      Exercises General Exercises - Lower Extremity Ankle Circles/Pumps: AROM;Both;15 reps Short Arc Quad: AROM;Both;5 reps Long Arc Quad: AROM;Strengthening;Both;5 reps (resisted extension)    General Comments        Pertinent Vitals/Pain Pain Assessment: No/denies pain    Home Living                      Prior Function            PT Goals (current goals can now be found in the care plan section) Acute Rehab PT Goals Patient Stated Goal: get her strength back Time For Goal Achievement: 02/25/16 Progress towards PT goals: Progressing toward goals    Frequency    Min 3X/week      PT Plan Discharge plan needs to be updated  Co-evaluation             End of Session Equipment Utilized During Treatment: Gait belt Activity Tolerance: Patient limited by fatigue Patient left: in chair;with call bell/phone within reach;with chair alarm set     Time: 1620-1701 PT Time Calculation (min) (ACUTE ONLY): 41 min  Charges:  $Therapeutic Exercise: 8-22 mins $Therapeutic Activity: 23-37 mins                    G Codes:      Rickey Sadowski March 06, 2016, 5:18 PM  Pager [phone]

## 2016-02-15 NOTE — Progress Notes (Addendum)
Subjective: No acute events overnight.  Denies chest pain, dyspnea, dizziness, pain.  Family wishes to care for her at home with whatever home health and palliative care services are available.  Objective:  Vital signs in last 24 hours: Vitals:   02/15/16 0802 02/15/16 0900 02/15/16 1000 02/15/16 1130  BP: 121/71   111/70  Pulse: 91 (!) 101 (!) 101 96  Resp: 14 19 11 13   Temp: 97.7 F (36.5 C)   98 F (36.7 C)  TempSrc: Oral   Oral  SpO2: 98% 100% 100% 100%  Weight:      Height:       Filed Weights   02/12/16 2200 02/13/16 0330 02/14/16 0500  Weight: 171 lb 8.3 oz (77.8 kg) 171 lb 8.3 oz (77.8 kg) 159 lb 6.3 oz (72.3 kg)    Physical Exam  Constitutional: She is oriented to person, place, and time.  Frail, elderly woman in no distress, sitting in bed  Eyes: Conjunctivae are normal. No scleral icterus.  Cardiovascular:  Tachycardic, irregularly irregular rhythm, normal [id number], 2/6 systolic murmur Hands and feet warm  Pulmonary/Chest:  Normal work of breathing, good air movement, reduced breath sounds in lower lung fields  Abdominal: Soft. She exhibits no distension. There is no tenderness.  Musculoskeletal: She exhibits no edema or tenderness.  Neurological: She is alert and oriented to person, place, and time.  Skin: Skin is warm and dry.  Psychiatric: She has a normal mood and affect. Her behavior is normal.   CBC Latest Ref Rng & Units 02/15/2016 02/14/2016 02/13/2016  WBC 4.0 - 10.5 K/uL 8.9 9.9 9.6  Hemoglobin 12.0 - 15.0 g/dL 8.7(L) 8.9(L) 9.1(L)  Hematocrit 36.0 - 46.0 % 31.5(L) 31.8(L) 33.1(L)  Platelets 150 - 400 K/uL 117(L) 119(L) 108(L)   BMP Latest Ref Rng & Units 02/15/2016 02/14/2016 02/13/2016  Glucose 65 - 99 mg/dL 103(H) 98 154(H)  BUN 6 - 20 mg/dL 68(H) 73(H) 79(H)  Creatinine 0.44 - 1.00 mg/dL 5.57(H) 5.36(H) 5.44(H)  BUN/Creat Ratio 11 - 26 - - -  Sodium 135 - 145 mmol/L 141 143 142  Potassium 3.5 - 5.1 mmol/L 3.4(L) 3.8 3.9  Chloride 101 - 111 mmol/L  100(L) 101 99(L)  CO2 22 - 32 mmol/L 27 26 30   Calcium 8.9 - 10.3 mg/dL 7.3(L) 7.3(L) 7.3(L)   Lab Results  Component Value Date   INR 2.42 02/15/2016   INR 2.10 02/14/2016   INR 3.05 02/13/2016   Assessment/Plan:  Principal Problem:   UTI (lower urinary tract infection) Active Problems:   Hypothyroidism   Hyperlipidemia   CKD (chronic kidney disease), stage V (HCC)   Atrial fibrillation (HCC)   Anemia associated with chronic renal failure   External hemorrhoids   Acute kidney injury superimposed on chronic kidney disease (HCC)   Dehydration   Sepsis (HCC)   Supratherapeutic INR   E. coli sepsis (HCC)   Hypotension   Pleural effusion   Chronic systolic heart failure (HCC)   Hypoxemia  #UTI #E Coli bacteremia She was treated with IV antibiotics (zosyn, then ceftriaxone) for 3 days, and responded well with clinical improvement and resolving leukocytosis.  She was then switched to oral cephalexin on 9/25 after culture sensitivities showed pan-sensitive E Coli.  On 9/26, she had clinical deterioration with hypotension and increased WBCs.  She was switched back to IV ceftriaxone on 9/26 evening, will continue with IV Abx since her decompensation seemed to correlate with switch to PO Abx. -Complete 10 days Abx for gram  negative bacteremia with ceftriaxone (last 10/2)  #Supratherapeutic INR On warfarin for AF stroke prophylaxis, INR goal 2-3, presented supratherapeutic with INR >10.  Given total of 15 mg PO Vit K with minimal effect on INR.  She was transfused with 3U FFP given 10 mg IV Vitamin K and her INR rapidly dropped.  Unclear why refractory INR precipitously dropped.  May have poor or delayed absorption of enteric Vit K.  Now therapeutic for 3 consecutive days. -Discharge with 2.5 mg warfarin daily   #AF with RVR Rates well controlled 80s-100s.  Metoprolol decreased to 25 mg daily (from 75 mg at home) on admission with concern for sepsis, increased to 50 mg and she became  mildly hypotensive, and discontinued with hypotension on 9/26.  Rates now controlled on amiodarone alone. -Amiodarone 200 mg BID -Hold metoprolol  #CHF Pulmonary but no peripheral edema, hypotensive at presentation but now normotensive after 2L and antibiotics. Good oxygenation on room air.  Volume status is precarious with CHF, CKD, and concern for sepsis.  Weight stable.  EF 20-25%, down from 30-35% one month ago.  Diuretics held from 9/26 on. -Resume diuresis with Lasix PO 20 mg BID -Fluid restrict 1200 mL -I/Os and daily weights  #Acute on Chronic Kidney Disease Not a dialysis candidate.  Acute decrease in renal function likely prerenal secondary to dehydration and infection.  Creatine now back near baseline. -Daily BMPs -I/Os and daily weights -continue home calcimimetic, PO bicarb  #Hypokalemia -Continue oral KCl supplementation.  #Hypotension Resolved.  Unclear cause of her clinical deterioration on 9/26, but now back to baseline.  EF 20-25%, down from 30-35% at the beginning of this month and small pericardial effusion without tamponade.  Moderate left pleural effusion which was present on admission but may be somewhat increased. CT head, A/P with no hemorrhage.  No lactic acidosis.  She was given 4.5L crystalloid without much improvement in BPs.  Possible etiologies are cardiogenic shock due to worsening systolic function following gram negative bacteremia, or recurrent infection.  #Elevated Troponins Resolved.  She has no chest pain or dyspnea, but has elevated troponins and possible new ischemic EKG changes.  Her elevated cardiac markers likely represent demand ischemia in the setting of infection.  She is currently anticoagulated with supratherapeutic INR, so unless STEMI emerges no further treatment would be available even if occlusive disease.  Dispo: Anticipated discharge in approximately 3 day(s).   Minus Liberty, MD 02/15/2016, 12:30 PM Pager: [phone]

## 2016-02-16 LAB — BASIC METABOLIC PANEL
ANION GAP: 19 — AB (ref 5–15)
BUN: 66 mg/dL — ABNORMAL HIGH (ref 6–20)
CALCIUM: 7.8 mg/dL — AB (ref 8.9–10.3)
CHLORIDE: 100 mmol/L — AB (ref 101–111)
CO2: 24 mmol/L (ref 22–32)
Creatinine, Ser: 5.41 mg/dL — ABNORMAL HIGH (ref 0.44–1.00)
GFR calc non Af Amer: 7 mL/min — ABNORMAL LOW (ref >60)
GFR, EST AFRICAN AMERICAN: 8 mL/min — AB (ref >60)
Glucose, Bld: 103 mg/dL — ABNORMAL HIGH (ref 65–99)
POTASSIUM: 4.2 mmol/L (ref 3.5–5.1)
Sodium: 143 mmol/L (ref 135–145)

## 2016-02-16 LAB — GLUCOSE, CAPILLARY
GLUCOSE-CAPILLARY: 164 mg/dL — AB (ref 65–99)
Glucose-Capillary: 114 mg/dL — ABNORMAL HIGH (ref 65–99)
Glucose-Capillary: 121 mg/dL — ABNORMAL HIGH (ref 65–99)
Glucose-Capillary: 70 mg/dL (ref 65–99)

## 2016-02-16 LAB — PROTIME-INR
INR: 4.02
PROTHROMBIN TIME: 40.5 s — AB (ref 11.4–15.2)

## 2016-02-16 NOTE — Progress Notes (Signed)
Report called to 6N rn. No questions or concerns regarding transfer

## 2016-02-16 NOTE — Discharge Instructions (Addendum)
For your heart: -Take metoprolol 12.5 mg (1/2 tablet) twice a day (morning and evening) -Take furosemide 20 mg twice a day (morning and evening) -Take amiodarone 200 mg twice a day (morning and evening) -Take Aspirin once a day in the morning -Take atorvastatin 40 mg once in the evening  For your hemorrhoids: -Use the tuck pads and lidocaine gel as needed  For your kidneys: -Take sodium bicarbonate 2 tablets three times a day (morning, afternoon, and evening) -Take potassium 20 mEq one tablet once a day in the morning  For constipation: -Take senokot-S 1 or 2 tablets as needed for constipation  For thyroid: -Take levothyroxine 112 mcg one tablet in the morning before breakfast  You do not need to take medications for your diabetes at this time.

## 2016-02-16 NOTE — Progress Notes (Signed)
Subjective: No acute events overnight.  Denies chest pain, dyspnea, dizziness, pain.  Understands plan to discharge home with daughter.   Concerned about sore on her behind.  Objective:  Vital signs in last 24 hours: Vitals:   02/16/16 0334 02/16/16 0400 02/16/16 0555 02/16/16 0732  BP: (!) 133/95 (!) 136/100 129/88 (!) 115/97  Pulse: (!) 110 (!) 103  99  Resp: 16 10 14 15   Temp: 98.1 F (36.7 C)   97.7 F (36.5 C)  TempSrc: Oral   Oral  SpO2: 92% 92%  97%  Weight: 83 lb 3.2 oz (37.7 kg)     Height:       Filed Weights   02/13/16 0330 02/14/16 0500 02/16/16 0334  Weight: 171 lb 8.3 oz (77.8 kg) 159 lb 6.3 oz (72.3 kg) 83 lb 3.2 oz (37.7 kg)    Physical Exam  Constitutional: She is oriented to person, place, and time.  Frail, elderly woman, lying in bed, speaks quietly.  No distress.  Eyes: Conjunctivae are normal. No scleral icterus.  Cardiovascular:  Tachycardic, irregularly irregular rhythm, normal A999333, 2/6 systolic murmur Hands and feet warm  Pulmonary/Chest:  Normal work of breathing, good air movement, reduced breath sounds in lower lung fields  Abdominal: Soft. She exhibits no distension. There is no tenderness.  Musculoskeletal: She exhibits no edema or tenderness.  Neurological: She is alert and oriented to person, place, and time.  Skin: Skin is warm and dry.  Psychiatric: She has a normal mood and affect. Her behavior is normal.   CBC Latest Ref Rng & Units 02/15/2016 02/14/2016 02/13/2016  WBC 4.0 - 10.5 K/uL 8.9 9.9 9.6  Hemoglobin 12.0 - 15.0 g/dL 8.7(L) 8.9(L) 9.1(L)  Hematocrit 36.0 - 46.0 % 31.5(L) 31.8(L) 33.1(L)  Platelets 150 - 400 K/uL 117(L) 119(L) 108(L)   BMP Latest Ref Rng & Units 02/16/2016 02/15/2016 02/14/2016  Glucose 65 - 99 mg/dL 103(H) 103(H) 98  BUN 6 - 20 mg/dL 66(H) 68(H) 73(H)  Creatinine 0.44 - 1.00 mg/dL 5.41(H) 5.57(H) 5.36(H)  BUN/Creat Ratio 11 - 26 - - -  Sodium 135 - 145 mmol/L 143 141 143  Potassium 3.5 - 5.1 mmol/L 4.2  3.4(L) 3.8  Chloride 101 - 111 mmol/L 100(L) 100(L) 101  CO2 22 - 32 mmol/L 24 27 26   Calcium 8.9 - 10.3 mg/dL 7.8(L) 7.3(L) 7.3(L)   Lab Results  Component Value Date   INR 4.02 (HH) 02/16/2016   INR 2.42 02/15/2016   INR 2.10 02/14/2016   Filed Weights   02/13/16 0330 02/14/16 0500 02/16/16 0334  Weight: 171 lb 8.3 oz (77.8 kg) 159 lb 6.3 oz (72.3 kg) 83 lb 3.2 oz (37.7 kg)    Intake/Output Summary (Last 24 hours) at 02/16/16 [id number] Last data filed at 02/16/16 0601  Gross per 24 hour  Intake              893 ml  Output             1225 ml  Net             -332 ml    Assessment/Plan:  Principal Problem:   UTI (lower urinary tract infection) Active Problems:   Hypothyroidism   Hyperlipidemia   CKD (chronic kidney disease), stage V (HCC)   Atrial fibrillation (HCC)   Anemia associated with chronic renal failure   External hemorrhoids   Acute kidney injury superimposed on chronic kidney disease ([place])   Dehydration   Severe sepsis ([place])  Supratherapeutic INR   E. coli sepsis (HCC)   Hypotension   Pleural effusion   Chronic systolic heart failure (HCC)   Hypoxemia   Biventricular heart failure ([place])  #UTI #E Coli bacteremia She was treated with IV antibiotics (zosyn, then ceftriaxone) for 3 days, and responded well with clinical improvement and resolving leukocytosis.  She was then switched to oral cephalexin on 9/25 after culture sensitivities showed pan-sensitive E Coli.  On 9/26, she had clinical deterioration with hypotension and increased WBCs.  She was switched back to IV ceftriaxone on 9/26 evening, will continue with IV Abx since her decompensation seemed to correlate with switch to PO Abx.  Now stable without signs of ongoing infection. -Complete 10 days Abx for gram negative bacteremia with ceftriaxone (last 10/2)  #Anticoagulation #Supratherapeutic INR On warfarin for AF stroke prophylaxis, INR goal 2-3, presented supratherapeutic with INR >10.  Given total  of 15 mg PO Vit K with minimal effect on INR.  She was transfused with 3U FFP given 10 mg IV Vitamin K and her INR rapidly dropped.  Unclear why refractory INR precipitously dropped.  May have poor or delayed absorption of enteric Vit K.  After being therapeutic for 3 consecutive days, received 1 mg warfarin yesterday and INR up to above 4.  Maybe not be a warfarin candidate due to lability of INRs, and risk of bleeding outweighing benefit of stroke prophylaxis with her life expectancy. -Daily INR  #AF with RVR Rates well controlled 80s-100s.  Metoprolol decreased to 25 mg daily (from 75 mg at home) on admission with concern for sepsis, increased to 50 mg and she became mildly hypotensive, and discontinued with hypotension on 9/26.  Rates now controlled on amiodarone alone. -Amiodarone 200 mg BID -Hold metoprolol  #CHF Despite evidence of pulmonary edema on chest radiographs on admission, has maintained good oxygenation on room air.  Volume status is precarious with her advanced CHF and CKD.  Weight stable.  EF 20-25%, down from 30-35% one month ago.  Diuretics held from 9/26 to 9/28, restarted at lower dose on 9/29. -Resume diuresis with Lasix PO 20 mg BID -Fluid restrict 1200 mL -I/Os and daily weights  #Acute on Chronic Kidney Disease Not a dialysis candidate.  Acute decrease in renal function likely prerenal secondary to dehydration and infection.  Creatine now back near baseline. -Daily BMPs -I/Os and daily weights -continue home calcimimetic, PO bicarb  #Hypokalemia -Continue oral KCl supplementation.  #Hypotension Resolved.  Unclear cause of her clinical deterioration on 9/26, but now back to baseline.  EF 20-25%, down from 30-35% at the beginning of this month and small pericardial effusion without tamponade.  Moderate left pleural effusion which was present on admission but may be somewhat increased. CT head, A/P with no hemorrhage.  No lactic acidosis.  She was given 4.5L  crystalloid without much improvement in BPs.  Possible etiologies are cardiogenic shock due to worsening systolic function following gram negative bacteremia, or recurrent infection.  #Elevated Troponins Resolved.  She has no chest pain or dyspnea, but has elevated troponins and possible new ischemic EKG changes.  Her elevated cardiac markers likely represent demand ischemia in the setting of infection.  She is currently anticoagulated with supratherapeutic INR, so unless STEMI emerges no further treatment would be available even if occlusive disease.  Dispo: Anticipated discharge in approximately 2 day(s).   Minus Liberty, MD 02/16/2016, 7:47 AM Pager: [phone]

## 2016-02-16 NOTE — Progress Notes (Signed)
ANTICOAGULATION CONSULT NOTE - Follow Up Consult  Pharmacy Consult for warfarin Indication: atrial fibrillation  No Known Allergies  Patient Measurements: Height: 5\' 7"  (170.2 cm) Weight: 83 lb 3.2 oz (37.7 kg) IBW/kg (Calculated) : 61.6  Vital Signs: Temp: 97.7 F (36.5 C) (09/30 0732) Temp Source: Oral (09/30 0732) BP: 115/97 (09/30 0732) Pulse Rate: 99 (09/30 0732)  Labs:  Recent Labs  02/14/16 0251 02/15/16 0255 02/16/16 0534  HGB 8.9* 8.7*  --   HCT 31.8* 31.5*  --   PLT 119* 117*  --   LABPROT 23.9* 26.8* 40.5*  INR 2.10 2.42 4.02*  CREATININE 5.36* 5.57* 5.41*    Estimated Creatinine Clearance: 5.1 mL/min (by C-G formula based on SCr of 5.41 mg/dL (H)).   Assessment:  81 yo female with AFib and supratherapeutic INR on admission. INR down after 15 mg vit K and 3u FFP. Pharmacy consulted to restart warfarin. Pt INR back up and now 4.02.   Goal of Therapy:  INR 2-3 Monitor platelets by anticoagulation protocol: Yes   Plan:  Hold warfarin tonight Daily INR Monitor for S&S of bleed  Angela Burke, PharmD Pharmacy Resident Pager: [phone] 02/16/2016,9:26 AM

## 2016-02-17 DIAGNOSIS — L89151 Pressure ulcer of sacral region, stage 1: Secondary | ICD-10-CM | POA: Diagnosis present

## 2016-02-17 LAB — GLUCOSE, CAPILLARY
GLUCOSE-CAPILLARY: 110 mg/dL — AB (ref 65–99)
GLUCOSE-CAPILLARY: 143 mg/dL — AB (ref 65–99)
GLUCOSE-CAPILLARY: 151 mg/dL — AB (ref 65–99)
Glucose-Capillary: 105 mg/dL — ABNORMAL HIGH (ref 65–99)

## 2016-02-17 LAB — BASIC METABOLIC PANEL
ANION GAP: 17 — AB (ref 5–15)
BUN: 65 mg/dL — ABNORMAL HIGH (ref 6–20)
CO2: 26 mmol/L (ref 22–32)
Calcium: 8 mg/dL — ABNORMAL LOW (ref 8.9–10.3)
Chloride: 101 mmol/L (ref 101–111)
Creatinine, Ser: 5.36 mg/dL — ABNORMAL HIGH (ref 0.44–1.00)
GFR, EST AFRICAN AMERICAN: 8 mL/min — AB (ref >60)
GFR, EST NON AFRICAN AMERICAN: 7 mL/min — AB (ref >60)
GLUCOSE: 97 mg/dL (ref 65–99)
POTASSIUM: 4.5 mmol/L (ref 3.5–5.1)
Sodium: 144 mmol/L (ref 135–145)

## 2016-02-17 LAB — PROTIME-INR
INR: 5.66 — AB
PROTHROMBIN TIME: 52.8 s — AB (ref 11.4–15.2)

## 2016-02-17 MED ORDER — PHYTONADIONE 5 MG PO TABS
2.5000 mg | ORAL_TABLET | Freq: Once | ORAL | Status: AC
  Administered 2016-02-17: 2.5 mg via ORAL
  Filled 2016-02-17: qty 1

## 2016-02-17 MED ORDER — ASPIRIN EC 81 MG PO TBEC
81.0000 mg | DELAYED_RELEASE_TABLET | Freq: Every day | ORAL | Status: DC
  Administered 2016-02-17 – 2016-02-19 (×3): 81 mg via ORAL
  Filled 2016-02-17 (×3): qty 1

## 2016-02-17 NOTE — Progress Notes (Addendum)
   Subjective: Patient was seen and examined this morning. She denies any shortness of breath, chest pain, or nausea.   Objective: Vital signs in last 24 hours: Vitals:   02/16/16 1625 02/16/16 2000 02/16/16 2132 02/17/16 0629  BP: 111/73 122/82 132/69 (!) 138/95  Pulse:  (!) 108 (!) 116 78  Resp: 10 13 15 16   Temp: 98.3 F (36.8 C) 98.1 F (36.7 C) 98.1 F (36.7 C) 98 F (36.7 C)  TempSrc: Oral Oral Oral Oral  SpO2:  97% 100% 92%  Weight:   190 lb 0.6 oz (86.2 kg)   Height:       Physical Exam General: Vital signs reviewed.  Patient is elderly, chronically ill appearing, in no acute distress and cooperative with exam.  Cardiovascular: RRR Pulmonary/Chest: Mild inspiratory crackles in lower lung bases and decreased breath sounds in left lower base. Abdominal: Soft, non-tender, non-distended, BS + Extremities: No lower extremity edema bilaterally Skin: Stage 1 sacral decubitus ulcer. Moisture associated skin breakdown between inner thighs  Assessment/Plan: Principal Problem:   UTI (lower urinary tract infection) Active Problems:   Hypothyroidism   Hyperlipidemia   CKD (chronic kidney disease), stage V (HCC)   Atrial fibrillation (HCC)   Anemia associated with chronic renal failure   External hemorrhoids   Acute kidney injury superimposed on chronic kidney disease (HCC)   Dehydration   Severe sepsis (HCC)   Supratherapeutic INR   E. coli sepsis (HCC)   Hypotension   Pleural effusion   Chronic systolic heart failure (HCC)   Hypoxemia   Biventricular heart failure  E. Coli Bacteremia 2/2 UTI: Afebrile, no leukocytosis. Improved clinically on Ceftriaxone IV. Patient will finish a 10 day course of abx on 02/18/16. -Complete 10 days Abx for gram negative bacteremia with ceftriaxone (last 10/2) -BCx 9/22 >> E. Coli -BCx 9/24 >> NGTD  Supratherapeutic INR 2/2 Coumadin: Patient is on coumadin for stroke prophylaxis given her paroxysmal atrial fibrillation and high  CHADS-VASc score. However, she is persistently supratherapeutic on her Warfarin despite gently dosing. Patient may not be a warfarin candidate due to lability of INRs, and risk of bleeding outweighing benefit of stroke prophylaxis with her life expectancy. She is not a candidate for NOAC given her CKD. -Daily INR -Vitamin K 2.5 mg once -Discontinue Warfarin -Start Aspirin 81 mg daily  Atrial Fibrillation with RVR: Rates well controlled 80s-100s.   -Amiodarone 200 mg BID -Metoprolol discontinued due to hypotension  Chronic Combined CHF: EF 20-25%, down from 30-35% one month ago. Baseline weight from previous discharge was 302. Weight 190 today.  -Lasix PO 20 mg BID -Fluid restrict 1200 mL -I/Os and daily weights -No ACEI/ARB due to CKD -BB discontinue due to hypotension -Atorvastatin 40 mg daily  Chronic Kidney Disease Stage V: Not a dialysis candidate.  -I/Os and daily weights -Continue home calcimimetic, PO bicarb -Lasix 20 mg BID  Hypothyroidism: On synthroid 112 mcg QD. TSH elevated from 5>11 this admission. Unclear if truly uncontrolled or due to acute illness.  -Continue Synthroid 112 mcg daily -Check Free T4  DVT/PE ppx: Supratherapeutic FEN: Renal CODE: DNR  Dispo: Anticipated discharge in approximately 1 day(s).   LOS: 9 days   Martyn Malay, DO PGY-3 Internal Medicine Resident Pager # [phone] 02/17/2016 10:43 AM

## 2016-02-17 NOTE — Progress Notes (Signed)
CRITICAL VALUE ALERT  Critical value received: INR 5.6  Date of notification:  02/17/2016  Time of notification:  0839  Critical value read back:Yes.    Nurse who received alert:  Morton Peters  MD notified (1st page):  Dr. Lovena Le [phone]  Time of first page:  0840 MD notified (2nd page):  Time of second page:  Responding MD: Dr. Lovena Le  Time MD responded: [phone]

## 2016-02-17 NOTE — Progress Notes (Signed)
Medicine attending: I examined this patient today together with resident physician Dr. Martyn Malay and I concur with her evaluation and management plan which we discussed together. No acute change in the patient's condition. We are waiting for durable medical equipment to arrive at the patient's home. She will complete a prescribed course of antibiotics for Escherichia coli bacteremia. INR is up again. She is exquisitely sensitive to warfarin. We will discontinue the drug until INR is subtherapeutic and then decide whether or not to put her back on a very low-dose, 1 mg daily, or to make a compromise and just put her on aspirin.

## 2016-02-18 ENCOUNTER — Telehealth: Payer: Self-pay | Admitting: Internal Medicine

## 2016-02-18 DIAGNOSIS — L89151 Pressure ulcer of sacral region, stage 1: Secondary | ICD-10-CM

## 2016-02-18 DIAGNOSIS — E039 Hypothyroidism, unspecified: Secondary | ICD-10-CM

## 2016-02-18 DIAGNOSIS — I4891 Unspecified atrial fibrillation: Secondary | ICD-10-CM

## 2016-02-18 DIAGNOSIS — Z66 Do not resuscitate: Secondary | ICD-10-CM

## 2016-02-18 DIAGNOSIS — B9620 Unspecified Escherichia coli [E. coli] as the cause of diseases classified elsewhere: Secondary | ICD-10-CM

## 2016-02-18 DIAGNOSIS — I5042 Chronic combined systolic (congestive) and diastolic (congestive) heart failure: Secondary | ICD-10-CM

## 2016-02-18 DIAGNOSIS — R112 Nausea with vomiting, unspecified: Secondary | ICD-10-CM

## 2016-02-18 DIAGNOSIS — Z79899 Other long term (current) drug therapy: Secondary | ICD-10-CM

## 2016-02-18 DIAGNOSIS — R000 Tachycardia, unspecified: Secondary | ICD-10-CM

## 2016-02-18 LAB — GLUCOSE, CAPILLARY
GLUCOSE-CAPILLARY: 157 mg/dL — AB (ref 65–99)
GLUCOSE-CAPILLARY: 118 mg/dL — AB (ref 65–99)
GLUCOSE-CAPILLARY: 120 mg/dL — AB (ref 65–99)
Glucose-Capillary: 138 mg/dL — ABNORMAL HIGH (ref 65–99)

## 2016-02-18 LAB — PROTIME-INR
INR: 5.47 — AB
Prothrombin Time: 51.4 seconds — ABNORMAL HIGH (ref 11.4–15.2)

## 2016-02-18 LAB — T4, FREE: Free T4: 0.99 ng/dL (ref 0.61–1.12)

## 2016-02-18 MED ORDER — PHYTONADIONE 5 MG PO TABS
5.0000 mg | ORAL_TABLET | Freq: Once | ORAL | Status: AC
  Administered 2016-02-18: 5 mg via ORAL
  Filled 2016-02-18: qty 1

## 2016-02-18 MED ORDER — METOPROLOL TARTRATE 12.5 MG HALF TABLET
12.5000 mg | ORAL_TABLET | Freq: Two times a day (BID) | ORAL | Status: DC
  Administered 2016-02-18 (×2): 12.5 mg via ORAL
  Filled 2016-02-18 (×2): qty 1

## 2016-02-18 MED ORDER — GUAIFENESIN-DM 100-10 MG/5ML PO SYRP: 5.0000 mL | ORAL_SOLUTION | ORAL | Status: DC | PRN

## 2016-02-18 NOTE — Progress Notes (Signed)
  Date: 02/18/2016  Patient name: JHORDAN SARK  Medical record number: [id number]  Date of birth: 16-Feb-1935   This patient has been seen and the plan of care was discussed with the house staff. Please see Dr. Rivka Safer note for complete details. I concur with her findings.   Items limiting her discharge are hospital bed arrival at home and family able to care for her, N/V with medications this morning, unclear if this is new or a chronic issue, supratherapeutic INR.  INR could be followed outpatient in our coumadin clinic downstairs, however, with her other issues should be monitored inpatient.  Also with tachycardia to the 120s, needs to be re-initiated on her beta blocker.  Discharge tomorrow likely if all issues are addressed.    [name], MD 02/18/2016, 2:14 PM

## 2016-02-18 NOTE — Consult Note (Signed)
[place] Nurse wound consult note Reason for Consult:Deep tissue injury to coccyx Moisture Associated Skin Damage to sacrum, coccyx and groin Incontinent of bowel and bladder.  Wound type:Moisture and pressure injury Pressure Ulcer POA: Yes Measurement:Coccyx 1 cm x 0.3 cm intact maroon discoloration with blistered center.  Erythema and discoloration to sacrum, groin and buttocks from exposure to incontinence.  Wound [id number] to coccyx Drainage (amount, consistency, odor) None noted Periwound:Blanchable erythema, patient states she is tender to touch to perineum and buttocks.  Will implement skin protection with barrier cream and prompt incontinence care.  Dressing procedure/placement/frequency:Cleanse skin to sacrum, buttocks and perineum with soap and water.  Apply barrier cream to skin.  Prompt incontinence care.  Turn and reposition every two hours.   Will not follow at this time.  Please re-consult if needed.  Domenic Moras RN BSN [place] Pager [phone]

## 2016-02-18 NOTE — Care Management Important Message (Signed)
Important Message  Patient Details  Name: [name] MRN: [id number] Date of Birth: Sep 22, 1934   Medicare Important Message Given:  Yes    [name] 02/18/2016, 1:49 PM

## 2016-02-18 NOTE — Care Management Note (Addendum)
Case Management Note  Patient Details  Name: [name] MRN: [id number] Date of Birth: 01-18-1935  Subjective/Objective:                    Action/Plan:  Left voice mail for patient's daughter to see if the hospital bed has been delivered. Awaiting call back.  Rachael ( daughter ) arrived at bedside. Hospital bed has not been delivered as of yet . [place] they will call Rachael [phone] for delivery for today.   Patient and daughter requesting ambulance transport home at discharge.   Dr Quay Burow aware Rachael is at bedside . Discharge will not be tomorrow due to starting new medication . Dr Quay Burow will discuss with patient and daughter. Expected Discharge Date:                  Expected Discharge Plan:  [place]  In-House Referral:     Discharge planning Services  CM Consult  Post Acute Care Choice:  Durable Medical Equipment, Home Health Choice offered to:  Patient, Adult Children  DME Arranged:  Hospital bed, Wheelchair electric DME Agency:  [place] Arranged:  PT, OT, RN, Disease Management [place] Agency:  [facility] ([facility]), Other - See comment  Status of Service:  In process, will continue to follow  If discussed at Long Length of Stay Meetings, dates discussed:    Additional Comments:  Marilu Favre, RN 02/18/2016, 11:46 AM

## 2016-02-18 NOTE — Progress Notes (Signed)
[id number] Received a critical INR result of 5.47, Dr Philipp Ovens notified.

## 2016-02-18 NOTE — Progress Notes (Signed)
   Subjective: Patient experienced coughing fit this morning after taking her medications. She spit up sputum into basin but was able to keep down her meds. She denies chest pain and endorses chronic SOB that is unchanged. She has been afebrile overnight. Her foley was pulled yesterday and she endorses multiple episodes of urinary incontinence. She otherwise feels fine.   Objective:  Vital signs in last 24 hours: Vitals:   02/17/16 0629 02/17/16 1406 02/17/16 2007 02/18/16 0428  BP: (!) 138/95 (!) 126/107 127/81 116/61  Pulse: 78 (!) 110 (!) 110 (!) 128  Resp: 16  19 19   Temp: 98 F (36.7 C) 98.3 F (36.8 C) 98.2 F (36.8 C) 98.1 F (36.7 C)  TempSrc: Oral  Oral Oral  SpO2: 92%  93% 94%  Weight:      Height:       Physical Exam Constitutional: NAD, appears comfortable Cardiovascular: Tachycardic but regular rhythm, no murmurs, rubs, or gallops.  Pulmonary/Chest: Mild bibasilar inspiratory crackles, decreased breath sounds in left lower base unchanged Abdominal: Soft, non tender, non distended. +BS.  Extremities: Warm and well perfused. Distal pulses intact. No edema. \ Skin: Stage 1 sacral decubitus ulcer. Moisture associated skin breakdown between inner thighs Psychiatric: Normal mood and affect  Assessment/Plan:  E. Coli Bacteremia 2/2 UTI: Afebrile, no leukocytosis. Improved clinically on Ceftriaxone IV. Patient finished 10 day course of abx on today (02/18/16).  -- Finished 10 days Abx for gram negative bacteremia with ceftriaxone (last dose 10/2) -- BCx 9/22 >> E. Coli -- BCx 9/24 >> NGTD  Supratherapeutic INR 2/2 Coumadin: Patient is on coumadin for stroke prophylaxis given her paroxysmal atrial fibrillation and high CHADS-VASc score. However, she is persistently supratherapeutic on her Warfarin despite gently dosing. Patient may not be a warfarin candidate due to lability of INRs, and risk of bleeding outweighing benefit of stroke prophylaxis with her life expectancy. She  is not a candidate for NOAC given her CKD. -- S/p Vitamin K 2.5 mg once yesterday for INR 5.6, unchanged this morning. Will give additional 5 mg PO Vit K today -- No signs of active bleeding -- Warfarin discontinued, started Aspirin 81 mg daily -- Daily INR  Atrial Fibrillation with RVR: Rates have been controlled 80s-100s however increasing over the past 24 hours, now persistently tachycardic in the 120s. Metoprolol initially held due to sepsis and hypotension.  -- Restart metoprolol 12.5 mg BID, titrate as BP tolerates (takes 75mg  XL daily at home) -- Continue Amiodarone 200 mg BID  Chronic Combined CHF: EF 20-25%, down from 30-35% one month ago. Baseline weight from previous discharge was 302. Weight 170 on admission and 190 on 9/30. Has not been checked since.  -- Lasix PO 20 mg BID -- Daily weights -- Fluid restrict 1200 mL -- I/Os -- No ACEI/ARB due to CKD -- BB held due to hypotension; restarted metoprolol 12.5 BID today due to tachycardia  -- Atorvastatin 40 mg daily  Chronic Kidney Disease Stage V: Not a dialysis candidate.  -- I/Os and daily weights -- Continue home calcimimetic, PO bicarb -- Lasix 20 mg BID  Hypothyroidism: On synthroid 112 mcg QD. TSH elevated from 5>11 this admission. Free T4 within normal range. Like due to acute illness.  -- Continue Synthroid 112 mcg daily -- F/u PCP for TSH recheck   DVT/PE ppx: SCDs, INR supratherapeutic - discontinued warfarin.  FEN: Renal CODE: DNR  Dispo: Anticipated discharge in approximately 1-2 day(s).   Velna Ochs, MD 02/18/2016, 7:00 AM Pager: [phone]

## 2016-02-18 NOTE — Telephone Encounter (Signed)
Needs TOC discharge date 02/18/16 HFU 02/21/16

## 2016-02-19 LAB — GLUCOSE, CAPILLARY
GLUCOSE-CAPILLARY: 103 mg/dL — AB (ref 65–99)
GLUCOSE-CAPILLARY: 119 mg/dL — AB (ref 65–99)

## 2016-02-19 MED ORDER — LIDOCAINE 4 % EX CREA: TOPICAL_CREAM | Freq: Two times a day (BID) | CUTANEOUS | 0 refills | Status: DC | PRN

## 2016-02-19 MED ORDER — ATORVASTATIN CALCIUM 40 MG PO TABS: 40.0000 mg | ORAL_TABLET | Freq: Every day | ORAL | 3 refills | Status: DC

## 2016-02-19 MED ORDER — WITCH HAZEL-GLYCERIN EX PADS: MEDICATED_PAD | CUTANEOUS | 12 refills | Status: DC | PRN

## 2016-02-19 MED ORDER — GUAIFENESIN ER 600 MG PO TB12: 600.0000 mg | ORAL_TABLET | Freq: Two times a day (BID) | ORAL | 2 refills | Status: DC

## 2016-02-19 MED ORDER — ONDANSETRON HCL 4 MG PO TABS: 4.0000 mg | ORAL_TABLET | Freq: Three times a day (TID) | ORAL | 0 refills | Status: DC | PRN

## 2016-02-19 MED ORDER — METOPROLOL TARTRATE 25 MG PO TABS: 12.5000 mg | ORAL_TABLET | Freq: Two times a day (BID) | ORAL | 11 refills | Status: DC

## 2016-02-19 MED ORDER — FUROSEMIDE 20 MG PO TABS: 20.0000 mg | ORAL_TABLET | Freq: Two times a day (BID) | ORAL | 11 refills | Status: DC

## 2016-02-19 MED ORDER — CETIRIZINE HCL 10 MG PO CHEW: 10.0000 mg | CHEWABLE_TABLET | Freq: Every day | ORAL | 11 refills | Status: DC

## 2016-02-19 MED ORDER — SENNA-DOCUSATE SODIUM 8.6-50 MG PO TABS: 2.0000 | ORAL_TABLET | Freq: Every day | ORAL | 1 refills | Status: DC | PRN

## 2016-02-19 MED ORDER — METOPROLOL TARTRATE 12.5 MG HALF TABLET
12.5000 mg | ORAL_TABLET | Freq: Two times a day (BID) | ORAL | Status: DC
  Administered 2016-02-19: 12.5 mg via ORAL

## 2016-02-19 MED ORDER — LEVOTHYROXINE SODIUM 112 MCG PO TABS: 112.0000 ug | ORAL_TABLET | Freq: Every day | ORAL | 6 refills | Status: DC

## 2016-02-19 MED ORDER — METOPROLOL TARTRATE 25 MG PO TABS
25.0000 mg | ORAL_TABLET | Freq: Two times a day (BID) | ORAL | Status: DC
  Filled 2016-02-19: qty 1

## 2016-02-19 MED ORDER — PHYTONADIONE 5 MG PO TABS
5.0000 mg | ORAL_TABLET | Freq: Once | ORAL | Status: AC
  Administered 2016-02-19: 5 mg via ORAL
  Filled 2016-02-19: qty 1

## 2016-02-19 MED ORDER — HYDROCORTISONE 2.5 % RE CREA: 1.0000 "application " | TOPICAL_CREAM | Freq: Two times a day (BID) | RECTAL | 2 refills | Status: DC

## 2016-02-19 MED ORDER — GUAIFENESIN-DM 100-10 MG/5ML PO SYRP
5.0000 mL | ORAL_SOLUTION | ORAL | 0 refills | Status: DC | PRN
Start: 2016-02-19 — End: 2016-02-28

## 2016-02-19 MED ORDER — SODIUM BICARBONATE 650 MG PO TABS: 1300.0000 mg | ORAL_TABLET | Freq: Three times a day (TID) | ORAL | 11 refills | Status: DC

## 2016-02-19 MED ORDER — POTASSIUM CHLORIDE CRYS ER 20 MEQ PO TBCR: 20.0000 meq | EXTENDED_RELEASE_TABLET | Freq: Every day | ORAL | 2 refills | Status: DC

## 2016-02-19 MED ORDER — AMIODARONE HCL 200 MG PO TABS: 200.0000 mg | ORAL_TABLET | Freq: Two times a day (BID) | ORAL | 11 refills | Status: DC

## 2016-02-19 MED ORDER — ASPIRIN 81 MG PO TBEC: 81.0000 mg | DELAYED_RELEASE_TABLET | Freq: Every day | ORAL | 11 refills | Status: DC

## 2016-02-19 NOTE — Consult Note (Signed)
Patient has had recent complaints of N/V, beginning yesterday after taking her AM meds. In order to facilitate easier administration, her inpatient medication list has been reviewed to determine alternatives to swallowing tablets whole.   Per the ISMP 2016 Do Not Crush list, her applicable medications include aspirin EC, guaifenesin, and pantoprazole.  Levothyroxine - crush and mix in 1-2 teaspoons water (levothyroxine package insert, [employer]) Potassium chloride SA 20 mEq tablet - per [facility] instructions, whole tablet may be dissolved in 4 oz. water. Allow 2 minutes to dissolve, then stir for 30 seconds. Swirl and drink all the liquid right away. Sodium bicarbonate can be dissolved in water and swallowed immediately. Furosemide is available in an oral solution at 10mg /mL concentration and ondansetron is available as an orally-disintegrating tablet. Alternate routes of administration are available for senna-docusate tablets, such as enemas or suppositories.  [name], Jerald Kief PharmD Candidate [name], [facility] PharmD Candidate Shiela Mayer, Jerald Kief PharmD Candidate

## 2016-02-19 NOTE — Progress Notes (Signed)
Physical Therapy Treatment Patient Details Name: [name] MRN: [id number] DOB: 1934-11-08 Today's Date: 02/19/2016    History of Present Illness 81 y.o. female admitted with UTI. PMH: CHF, AFib, DM, stroke, bilateral transtarsal amputations.    PT Comments    Pt admitted with above diagnosis. Pt currently with functional limitations due to balance and endurance deficits. Used STedy to get OOB.  Pt also exercised.  Will follow acutely.   Pt will benefit from skilled PT to increase their independence and safety with mobility to allow discharge to the venue listed below.    Follow Up Recommendations  Supervision for mobility/OOB;Home health PT (family wants to take pt home; ?Hospice involved)     Equipment Recommendations  Hospital bed (? hoyer lift)    Recommendations for Other Services       Precautions / Restrictions Precautions Precautions: Fall Precaution Comments: pt requests use of her shoes (with orthotics s/p forefoot amputations) for standing/ambulation Required Braces or Orthoses: Other Brace/Splint Other Brace/Splint: orthotics in shoes for toe amputations Restrictions Weight Bearing Restrictions: No    Mobility  Bed Mobility Overal bed mobility: Needs Assistance Bed Mobility: Supine to Sit     Supine to sit: HOB elevated;Min assist     General bed mobility comments: pt moves slowly and did not need assist until she was sitting and feet did not reach the floor (assist to scoot hips forward  Transfers Overall transfer level: Needs assistance   Transfers: Sit to/from Stand;Stand Pivot Transfers Sit to Stand: Min assist;From elevated surface Stand pivot transfers: Total assist       General transfer comment: x2 from elevated bed to [facility] Used Stedy to move pt to chair.  Pt does well with STedy.  Ambulation/Gait             General Gait Details: declined to attempt    Stairs            Wheelchair Mobility    Modified Rankin (Stroke  Patients Only)       Balance Overall balance assessment: Needs assistance Sitting-balance support: Bilateral upper extremity supported;Feet supported Sitting balance-Leahy Scale: Fair     Standing balance support: Bilateral upper extremity supported;During functional activity Standing balance-Leahy Scale: Poor Standing balance comment: Stood with Stedy with bil UE supprt for up to 2 min                    Cognition Arousal/Alertness: Awake/alert Behavior During Therapy: Flat affect Overall Cognitive Status: Within Functional Limits for tasks assessed (slow response to questions)                      Exercises General Exercises - Lower Extremity Ankle Circles/Pumps: AROM;Both;15 reps Long Arc Quad: AROM;Strengthening;Both;5 reps (resisted extension)    General Comments        Pertinent Vitals/Pain Pain Assessment: No/denies pain  VSS    Home Living                      Prior Function            PT Goals (current goals can now be found in the care plan section) Progress towards PT goals: Progressing toward goals    Frequency    Min 3X/week      PT Plan Current plan remains appropriate    Co-evaluation             End of Session Equipment Utilized During Treatment: Gait belt  Activity Tolerance: Patient limited by fatigue Patient left: in chair;with call bell/phone within reach;with chair alarm set     Time: [id number] PT Time Calculation (min) (ACUTE ONLY): 24 min  Charges:  $Therapeutic Exercise: 8-22 mins $Therapeutic Activity: 8-22 mins                    G CodesDenice Paradise 2016/03/10, 10:18 AM Tonnia Bardin,PT Acute Rehabilitation [phone] (pager)

## 2016-02-19 NOTE — Progress Notes (Signed)
Discharged to home via ambulance. Discharge instructions given to pt's son.

## 2016-02-19 NOTE — Progress Notes (Signed)
  Date: 02/19/2016  Patient name: [name]  Medical record number: [id number]  Date of birth: 06-Sep-1934   This patient has been seen and the plan of care was discussed with the house staff. Please see their note for complete details. I concur with their findings.   Anticipate discharge today with home health services and family support.   [name], MD 02/19/2016, 2:20 PM

## 2016-02-19 NOTE — Progress Notes (Signed)
   Subjective: Patient out of bed this morning. Sitting up in chair eating breakfast. She continues to complain of cough but says mucinex is helping. She was able to take all her medications today without problems. She reports a poor appetite but this is chronic and unchanged per daughter.   Objective:  Vital signs in last 24 hours: Vitals:   02/18/16 2213 02/19/16 0430 02/19/16 0500 02/19/16 1059  BP: 132/76 114/73  100/60  Pulse: (!) 116 (!) 122  (!) 103  Resp: 18 18    Temp: 97.8 F (36.6 C) 97.7 F (36.5 C)    TempSrc: Oral Oral    SpO2: 96% 95%    Weight:   178 lb 5.6 oz (80.9 kg)   Height:       Physical Exam Constitutional: NAD, appears comfortable Cardiovascular: Tachycardic and irregular rhythm, no murmurs, rubs, or gallops.  Pulmonary/Chest: Mild bibasilar inspiratory crackles, decreased breath sounds in left lower base unchanged Abdominal: Soft, non tender, non distended. +BS.  Extremities: Warm and well perfused. Distal pulses intact. No edema. \ Skin: Stage 1 sacral decubitus ulcer. Moisture associated skin breakdown between inner thighs Psychiatric: Normal mood and affect  Assessment/Plan:  E.Coli Bacteremia 2/2 UTI: Afebrile, no leukocytosis. Improved clinically on Ceftriaxone IV. Patient finished 10 day course of abx on 02/18/16.  -- BCx 9/22 >> E. Coli -- BCx 9/24 >> No growth 5 days  Supratherapeutic INR 2/2 Coumadin: Patient is on coumadin for stroke prophylaxis given her paroxysmal atrial fibrillation and high CHADS-VASc score. However, she is persistently supratherapeutic on her Warfarin despite gently dosing. Patient may not be a warfarin candidate due to lability of INRs, and risk of bleeding outweighing benefit of stroke prophylaxis with her life expectancy. She is not a candidate for NOAC given her CKD. -- S/p Vitamin K 2.5 mg once 10/1 for INR 5.6, unchanged the following morning. Gave additional 5 mg PO Vit K yesterday and INR 4.3 today. -- Will give  additional 5 mg po Vit K today  -- Warfarin discontinued, continue Aspirin 81 mg daily -- Daily INR  Atrial Fibrillationwith [id number] have been controlled 80s-100s however increasing over the past 24 hours, now persistently tachycardic in the 120s. Metoprolol initially held due to sepsis and hypotension.  -- Restarted metoprolol 12.5 mg BID, titrate as BP tolerates (takes 75mg  XL daily at home) -- Continue Amiodarone 200 mg BID  Chronic Combined CHF: EF 20-25%, down from 30-35% one month ago. Baseline weight from previous discharge was 302. Weight 170 on admission and 190 on 9/30. Has not been checked since.  -- Lasix PO 20 mg BID -- Daily weights -- Fluid restrict 1200 mL -- I/Os -- No ACEI/ARB due to CKD -- BB held due to hypotension; restarted metoprolol 12.5 BID yesterday due to tachycardia  -- Atorvastatin 40 mg daily  Chronic Kidney Disease Stage V: Not a dialysis candidate.  -- I/Os and daily weights -- Continue home calcimimetic, PO bicarb -- Lasix 20 mg BID  Hypothyroidism: On synthroid 112 mcg QD. TSH elevated from 5>11 this admission. Free T4 within normal range. Like due to acute illness.  -- Continue Synthroid 112 mcg daily -- F/u PCP for TSH recheck   DVT/PE ppx: SCDs, INR supratherapeutic - discontinued warfarin.  FEN: Renal Code: DNR  Dispo: Anticipated discharge today.   Stacy Ochs, MD 02/19/2016, 1:15 PM Pager: [phone]

## 2016-02-19 NOTE — Progress Notes (Signed)
Paged on-call IM intern for pts' PT=43 from 51.4 and INR=4.38 from 5.47 yesterday. Pt. Had 5mg  tab Vit K yesterday morning. Waiting for response.

## 2016-02-19 NOTE — Care Management Note (Signed)
Case Management Note  Patient Details  Name: [name] MRN: [id number] Date of Birth: 03/30/1935  Subjective/Objective:                    Action/Plan:  Discussed discharge with patient's daughter Rachael [phone] , hospital bed is to be delivered to home today between 1200 and 1600 . When hospital bed is in the home Rachael will call patient's bedside nurse [place] directly. NCM has completed paperwork for ambulance transportation home.  Marissa will call for ambulance once hospital bed is at home and MD has finished discharge paperwork and patient ready to leave. Expected Discharge Date:                  Expected Discharge Plan:  [place]  In-House Referral:     Discharge planning Services  CM Consult  Post Acute Care Choice:  Durable Medical Equipment, Home Health Choice offered to:  Patient, Adult Children  DME Arranged:  Hospital bed, Wheelchair electric DME Agency:  [place] Arranged:  PT, OT, RN, Disease Management [place] Agency:  [facility] ([facility]), Other - See comment  Status of Service:  Completed, signed off  If discussed at [place] of Stay Meetings, dates discussed:    Additional Comments:  Marilu Favre, RN 02/19/2016, 11:37 AM

## 2016-02-21 ENCOUNTER — Ambulatory Visit: Payer: Self-pay

## 2016-02-21 LAB — PROTIME-INR
INR: 4.38
Prothrombin Time: 43 seconds — ABNORMAL HIGH (ref 11.4–15.2)

## 2016-02-21 NOTE — Telephone Encounter (Signed)
Called 10/4 no answer Called 10/5 no answer

## 2016-02-22 ENCOUNTER — Ambulatory Visit (INDEPENDENT_AMBULATORY_CARE_PROVIDER_SITE_OTHER): Payer: Medicare Other | Admitting: Internal Medicine

## 2016-02-22 ENCOUNTER — Encounter: Payer: Self-pay | Admitting: Internal Medicine

## 2016-02-22 VITALS — BP 102/60 | HR 81

## 2016-02-22 DIAGNOSIS — I5022 Chronic systolic (congestive) heart failure: Secondary | ICD-10-CM

## 2016-02-22 DIAGNOSIS — I482 Chronic atrial fibrillation, unspecified: Secondary | ICD-10-CM

## 2016-02-22 DIAGNOSIS — R791 Abnormal coagulation profile: Secondary | ICD-10-CM | POA: Diagnosis not present

## 2016-02-22 DIAGNOSIS — E039 Hypothyroidism, unspecified: Secondary | ICD-10-CM | POA: Diagnosis not present

## 2016-02-22 DIAGNOSIS — N185 Chronic kidney disease, stage 5: Secondary | ICD-10-CM | POA: Diagnosis not present

## 2016-02-22 DIAGNOSIS — Z79899 Other long term (current) drug therapy: Secondary | ICD-10-CM

## 2016-02-22 LAB — PROTIME-INR
INR: 6.1 — AB
PROTHROMBIN TIME: 56 s — AB (ref 11.4–15.2)

## 2016-02-22 MED ORDER — PHYTONADIONE 5 MG PO TABS: 5.0000 mg | ORAL_TABLET | Freq: Once | ORAL | 0 refills | Status: AC

## 2016-02-22 NOTE — Progress Notes (Signed)
CC: fatigue  HPI:  [name] is a 81 y.o. with a PMH of CHF, CKD, Hypothyroidism, A fib with supratherapeutic INR on warfarin, and CKD stage 4 presenting to clinic for hospital f/u after an E coli UTI with bacteremia.   During hospitalization, patient was found to be supratherapeutic on warfarin and required multiple doses of vitamin K and holding of warfarin to bring INR down to acceptable range. Patient was found to be extremely sensitive to warfarin and was therefore discharged home on asa 81 for stroke prophylaxis for A fib.   During hospitalization, patient's TSH was slightly elevated though this was thought to be due to acute illness so synthroid dose at that time was not adjusted; patient on synthroid 112 mcg daily.   For CHF, patient was discharged on lasix 20mg  BID, metoprolol tartrate 12.5mg  BID. During hospitalization, patient was found to be hypotensive with unknown source as she was already on antibiotics, so on discharge, diuretic dosage was decreased as patient's BP's remained low-normal after correction of hypotension with fluid.   Per daughter, since patient has been home, she has been mainly sleeping but is arousable - this has not gotten worse since discharge; she is too weak to stand on her own so has been primarily bed bound. She has refused solid food, but has taken in ensures and some water. She denies pain, she denies signs and symptoms of bleeding. She has bruising on her arms from IV's but family deny other bruising that they have noticed. She has incontinence of urine and bowel; denies abdominal pain, dysuria, hematuria, hematochezia, melena, or fever.  Please see problem based Assessment and Plan for status of patients chronic conditions.  Past Medical History:  Diagnosis Date  . Anemia    b12 def, iron def, follow at cancer center, gets B12 and  another injection there. Could not remeber the name. Dr. Ralene Ok is her  cancer doctor  . Asthma   . Atrial  fibrillation ([place])   . Blood transfusion    two or more yrs ago  . Chronic renal insufficiency   . Chronic venous insufficiency   . Diabetes mellitus   . Hyperlipidemia   . Hypertension   . Hypothyroidism    thyroid removed 4 or more yrs ago  . MGUS (monoclonal gammopathy of unknown significance)   . Osteomyelitis ([place])    s/p Rt 2nd toe and left 5 toes  amputation in 1/12  by Dr. Sharol Given  . Stroke ([place])   . Thyroid disease    hypothyroidism h/o hyperthyroidism s/p ablation/ectomy    Review of Systems:   Review of Systems  Constitutional: Positive for malaise/fatigue. Negative for chills and fever.  Respiratory: Positive for cough and shortness of breath. Negative for hemoptysis and sputum production.   Cardiovascular: Positive for orthopnea and leg swelling (not increased). Negative for chest pain and palpitations.  Gastrointestinal: Negative for abdominal pain, blood in stool, constipation, diarrhea, melena, nausea and vomiting.  Genitourinary: Negative for dysuria and hematuria.  Neurological: Positive for weakness. Negative for focal weakness and headaches.    Physical Exam:  Vitals:   02/22/16 1330  BP: 102/60  Pulse: 81  SpO2: 99%   Physical Exam  Constitutional: falling asleep during interview, however answers appropriated to name, birth date, location, date. CV: RRR, no murmurs, rubs or gallops appreciated Resp: no appreciable crackles or wheezes, no increased work of breathing Abd: soft, +BS, nontender, nondistended, no CVA tenderness Ext: warm, 1+ pulses throughout, 1+ pitting  edema bil LE  Assessment & Plan:   See Encounters Tab for problem based charting.   Patient seen with Dr. Abelardo Diesel, MD Internal Medicine PGY1

## 2016-02-22 NOTE — Patient Instructions (Addendum)
We will check on some of the blood work to check on her kidney function and thyroid. I will call you with the results and if we need to change any of her medications.

## 2016-02-23 DIAGNOSIS — I132 Hypertensive heart and chronic kidney disease with heart failure and with stage 5 chronic kidney disease, or end stage renal disease: Secondary | ICD-10-CM | POA: Diagnosis not present

## 2016-02-23 DIAGNOSIS — E1122 Type 2 diabetes mellitus with diabetic chronic kidney disease: Secondary | ICD-10-CM | POA: Diagnosis not present

## 2016-02-23 DIAGNOSIS — M1288 Other specific arthropathies, not elsewhere classified, other specified site: Secondary | ICD-10-CM | POA: Diagnosis not present

## 2016-02-23 DIAGNOSIS — N185 Chronic kidney disease, stage 5: Secondary | ICD-10-CM | POA: Diagnosis not present

## 2016-02-23 DIAGNOSIS — I5042 Chronic combined systolic (congestive) and diastolic (congestive) heart failure: Secondary | ICD-10-CM | POA: Diagnosis not present

## 2016-02-23 DIAGNOSIS — I872 Venous insufficiency (chronic) (peripheral): Secondary | ICD-10-CM | POA: Diagnosis not present

## 2016-02-23 DIAGNOSIS — D631 Anemia in chronic kidney disease: Secondary | ICD-10-CM | POA: Diagnosis not present

## 2016-02-23 DIAGNOSIS — I482 Chronic atrial fibrillation: Secondary | ICD-10-CM | POA: Diagnosis not present

## 2016-02-23 DIAGNOSIS — K649 Unspecified hemorrhoids: Secondary | ICD-10-CM | POA: Diagnosis not present

## 2016-02-23 DIAGNOSIS — E890 Postprocedural hypothyroidism: Secondary | ICD-10-CM | POA: Diagnosis not present

## 2016-02-23 LAB — BMP8+ANION GAP
Anion Gap: 24 mmol/L — ABNORMAL HIGH (ref 10.0–18.0)
BUN / CREAT RATIO: 10 — AB (ref 12–28)
BUN: 64 mg/dL — AB (ref 8–27)
CHLORIDE: 99 mmol/L (ref 96–106)
CO2: 27 mmol/L (ref 18–29)
Calcium: 7.9 mg/dL — ABNORMAL LOW (ref 8.7–10.3)
Creatinine, Ser: 6.36 mg/dL — ABNORMAL HIGH (ref 0.57–1.00)
GFR calc non Af Amer: 6 mL/min/{1.73_m2} — ABNORMAL LOW (ref >59)
GFR, EST AFRICAN AMERICAN: 7 mL/min/{1.73_m2} — AB (ref >59)
Glucose: 198 mg/dL — ABNORMAL HIGH (ref 65–99)
POTASSIUM: 5.2 mmol/L (ref 3.5–5.2)
SODIUM: 150 mmol/L — AB (ref 134–144)

## 2016-02-23 LAB — T4, FREE: FREE T4: 1.14 ng/dL (ref 0.82–1.77)

## 2016-02-23 LAB — TSH: TSH: 39.33 u[IU]/mL — AB (ref 0.450–4.500)

## 2016-02-24 ENCOUNTER — Other Ambulatory Visit: Payer: Self-pay

## 2016-02-24 ENCOUNTER — Encounter (HOSPITAL_COMMUNITY): Payer: Self-pay | Admitting: Emergency Medicine

## 2016-02-24 ENCOUNTER — Inpatient Hospital Stay (HOSPITAL_COMMUNITY)
Admission: EM | Admit: 2016-02-24 | Discharge: 2016-02-28 | DRG: 291 | Disposition: A | Discharge status: Hospice | Payer: Medicare Other | Attending: Internal Medicine | Admitting: Internal Medicine

## 2016-02-24 ENCOUNTER — Emergency Department (HOSPITAL_COMMUNITY): Payer: Medicare Other

## 2016-02-24 DIAGNOSIS — E785 Hyperlipidemia, unspecified: Secondary | ICD-10-CM | POA: Diagnosis not present

## 2016-02-24 DIAGNOSIS — R791 Abnormal coagulation profile: Secondary | ICD-10-CM | POA: Diagnosis present

## 2016-02-24 DIAGNOSIS — Z79899 Other long term (current) drug therapy: Secondary | ICD-10-CM

## 2016-02-24 DIAGNOSIS — B370 Candidal stomatitis: Secondary | ICD-10-CM | POA: Diagnosis present

## 2016-02-24 DIAGNOSIS — I4891 Unspecified atrial fibrillation: Secondary | ICD-10-CM | POA: Diagnosis present

## 2016-02-24 DIAGNOSIS — I5082 Biventricular heart failure: Secondary | ICD-10-CM | POA: Diagnosis present

## 2016-02-24 DIAGNOSIS — Y95 Nosocomial condition: Secondary | ICD-10-CM | POA: Diagnosis present

## 2016-02-24 DIAGNOSIS — R0902 Hypoxemia: Secondary | ICD-10-CM | POA: Diagnosis not present

## 2016-02-24 DIAGNOSIS — E039 Hypothyroidism, unspecified: Secondary | ICD-10-CM | POA: Diagnosis not present

## 2016-02-24 DIAGNOSIS — E1122 Type 2 diabetes mellitus with diabetic chronic kidney disease: Secondary | ICD-10-CM | POA: Diagnosis not present

## 2016-02-24 DIAGNOSIS — N185 Chronic kidney disease, stage 5: Secondary | ICD-10-CM | POA: Diagnosis present

## 2016-02-24 DIAGNOSIS — E440 Moderate protein-calorie malnutrition: Secondary | ICD-10-CM | POA: Diagnosis not present

## 2016-02-24 DIAGNOSIS — J189 Pneumonia, unspecified organism: Secondary | ICD-10-CM | POA: Diagnosis not present

## 2016-02-24 DIAGNOSIS — I10 Essential (primary) hypertension: Secondary | ICD-10-CM | POA: Diagnosis present

## 2016-02-24 DIAGNOSIS — J9811 Atelectasis: Secondary | ICD-10-CM | POA: Diagnosis present

## 2016-02-24 DIAGNOSIS — R34 Anuria and oliguria: Secondary | ICD-10-CM | POA: Diagnosis not present

## 2016-02-24 DIAGNOSIS — D472 Monoclonal gammopathy: Secondary | ICD-10-CM | POA: Diagnosis not present

## 2016-02-24 DIAGNOSIS — I5084 End stage heart failure: Secondary | ICD-10-CM | POA: Diagnosis present

## 2016-02-24 DIAGNOSIS — E875 Hyperkalemia: Secondary | ICD-10-CM | POA: Diagnosis not present

## 2016-02-24 DIAGNOSIS — Z515 Encounter for palliative care: Secondary | ICD-10-CM | POA: Diagnosis not present

## 2016-02-24 DIAGNOSIS — K219 Gastro-esophageal reflux disease without esophagitis: Secondary | ICD-10-CM | POA: Diagnosis not present

## 2016-02-24 DIAGNOSIS — K649 Unspecified hemorrhoids: Secondary | ICD-10-CM | POA: Diagnosis not present

## 2016-02-24 DIAGNOSIS — I482 Chronic atrial fibrillation: Secondary | ICD-10-CM | POA: Diagnosis not present

## 2016-02-24 DIAGNOSIS — I120 Hypertensive chronic kidney disease with stage 5 chronic kidney disease or end stage renal disease: Secondary | ICD-10-CM | POA: Diagnosis not present

## 2016-02-24 DIAGNOSIS — Z7982 Long term (current) use of aspirin: Secondary | ICD-10-CM

## 2016-02-24 DIAGNOSIS — R0600 Dyspnea, unspecified: Secondary | ICD-10-CM | POA: Diagnosis present

## 2016-02-24 DIAGNOSIS — B3789 Other sites of candidiasis: Secondary | ICD-10-CM | POA: Diagnosis not present

## 2016-02-24 DIAGNOSIS — I5023 Acute on chronic systolic (congestive) heart failure: Secondary | ICD-10-CM | POA: Diagnosis not present

## 2016-02-24 DIAGNOSIS — F419 Anxiety disorder, unspecified: Secondary | ICD-10-CM | POA: Diagnosis present

## 2016-02-24 DIAGNOSIS — R946 Abnormal results of thyroid function studies: Secondary | ICD-10-CM | POA: Diagnosis present

## 2016-02-24 DIAGNOSIS — R748 Abnormal levels of other serum enzymes: Secondary | ICD-10-CM | POA: Diagnosis present

## 2016-02-24 DIAGNOSIS — Z66 Do not resuscitate: Secondary | ICD-10-CM | POA: Diagnosis not present

## 2016-02-24 DIAGNOSIS — L89151 Pressure ulcer of sacral region, stage 1: Secondary | ICD-10-CM | POA: Diagnosis present

## 2016-02-24 DIAGNOSIS — R059 Cough, unspecified: Secondary | ICD-10-CM

## 2016-02-24 DIAGNOSIS — I872 Venous insufficiency (chronic) (peripheral): Secondary | ICD-10-CM | POA: Diagnosis present

## 2016-02-24 DIAGNOSIS — R531 Weakness: Secondary | ICD-10-CM | POA: Diagnosis not present

## 2016-02-24 DIAGNOSIS — N179 Acute kidney failure, unspecified: Secondary | ICD-10-CM | POA: Diagnosis present

## 2016-02-24 DIAGNOSIS — D509 Iron deficiency anemia, unspecified: Secondary | ICD-10-CM | POA: Diagnosis present

## 2016-02-24 DIAGNOSIS — J9600 Acute respiratory failure, unspecified whether with hypoxia or hypercapnia: Secondary | ICD-10-CM | POA: Diagnosis not present

## 2016-02-24 DIAGNOSIS — Z8673 Personal history of transient ischemic attack (TIA), and cerebral infarction without residual deficits: Secondary | ICD-10-CM

## 2016-02-24 DIAGNOSIS — Z6828 Body mass index (BMI) 28.0-28.9, adult: Secondary | ICD-10-CM

## 2016-02-24 DIAGNOSIS — I132 Hypertensive heart and chronic kidney disease with heart failure and with stage 5 chronic kidney disease, or end stage renal disease: Principal | ICD-10-CM | POA: Diagnosis present

## 2016-02-24 DIAGNOSIS — R05 Cough: Secondary | ICD-10-CM | POA: Diagnosis not present

## 2016-02-24 DIAGNOSIS — R32 Unspecified urinary incontinence: Secondary | ICD-10-CM | POA: Diagnosis present

## 2016-02-24 DIAGNOSIS — I313 Pericardial effusion (noninflammatory): Secondary | ICD-10-CM | POA: Diagnosis not present

## 2016-02-24 DIAGNOSIS — R159 Full incontinence of feces: Secondary | ICD-10-CM | POA: Diagnosis present

## 2016-02-24 DIAGNOSIS — R0602 Shortness of breath: Secondary | ICD-10-CM | POA: Diagnosis not present

## 2016-02-24 DIAGNOSIS — K761 Chronic passive congestion of liver: Secondary | ICD-10-CM | POA: Diagnosis present

## 2016-02-24 LAB — CBC WITH DIFFERENTIAL/PLATELET
BASOS PCT: 0 %
Basophils Absolute: 0 10*3/uL (ref 0.0–0.1)
EOS PCT: 1 %
Eosinophils Absolute: 0.1 10*3/uL (ref 0.0–0.7)
HEMATOCRIT: 35.7 % — AB (ref 36.0–46.0)
Hemoglobin: 9.9 g/dL — ABNORMAL LOW (ref 12.0–15.0)
LYMPHS ABS: 1.9 10*3/uL (ref 0.7–4.0)
Lymphocytes Relative: 19 %
MCH: 24.9 pg — AB (ref 26.0–34.0)
MCHC: 27.7 g/dL — ABNORMAL LOW (ref 30.0–36.0)
MCV: 89.7 fL (ref 78.0–100.0)
MONOS PCT: 8 %
Monocytes Absolute: 0.8 10*3/uL (ref 0.1–1.0)
NEUTROS PCT: 72 %
Neutro Abs: 7 10*3/uL (ref 1.7–7.7)
Platelets: 89 10*3/uL — ABNORMAL LOW (ref 150–400)
RBC: 3.98 MIL/uL (ref 3.87–5.11)
RDW: 25.5 % — ABNORMAL HIGH (ref 11.5–15.5)
WBC: 9.8 10*3/uL (ref 4.0–10.5)

## 2016-02-24 LAB — I-STAT TROPONIN, ED: Troponin i, poc: 0.04 ng/mL (ref 0.00–0.08)

## 2016-02-24 LAB — PROTIME-INR
INR: 7.26
Prothrombin Time: 64.4 seconds — ABNORMAL HIGH (ref 11.4–15.2)

## 2016-02-24 LAB — I-STAT CHEM 8, ED
BUN: 67 mg/dL — AB (ref 6–20)
CREATININE: 6.9 mg/dL — AB (ref 0.44–1.00)
Calcium, Ion: 0.71 mmol/L — CL (ref 1.15–1.40)
Chloride: 104 mmol/L (ref 101–111)
GLUCOSE: 177 mg/dL — AB (ref 65–99)
HCT: 35 % — ABNORMAL LOW (ref 36.0–46.0)
Hemoglobin: 11.9 g/dL — ABNORMAL LOW (ref 12.0–15.0)
POTASSIUM: 5.6 mmol/L — AB (ref 3.5–5.1)
Sodium: 144 mmol/L (ref 135–145)
TCO2: 26 mmol/L (ref 0–100)

## 2016-02-24 LAB — C DIFFICILE QUICK SCREEN W PCR REFLEX
C DIFFICILE (CDIFF) TOXIN: NEGATIVE
C Diff antigen: POSITIVE — AB

## 2016-02-24 LAB — BRAIN NATRIURETIC PEPTIDE: B Natriuretic Peptide: 986.2 pg/mL — ABNORMAL HIGH (ref 0.0–100.0)

## 2016-02-24 LAB — GLUCOSE, CAPILLARY
GLUCOSE-CAPILLARY: 192 mg/dL — AB (ref 65–99)
GLUCOSE-CAPILLARY: 229 mg/dL — AB (ref 65–99)
Glucose-Capillary: 158 mg/dL — ABNORMAL HIGH (ref 65–99)

## 2016-02-24 LAB — CLOSTRIDIUM DIFFICILE BY PCR: CDIFFPCR: NEGATIVE

## 2016-02-24 MED ORDER — ALBUTEROL SULFATE (2.5 MG/3ML) 0.083% IN NEBU
5.0000 mg | INHALATION_SOLUTION | Freq: Once | RESPIRATORY_TRACT | Status: AC
  Administered 2016-02-24: 5 mg via RESPIRATORY_TRACT
  Filled 2016-02-24: qty 6

## 2016-02-24 MED ORDER — ENSURE ENLIVE PO LIQD
237.0000 mL | Freq: Two times a day (BID) | ORAL | Status: DC
  Administered 2016-02-24 – 2016-02-26 (×3): 237 mL via ORAL

## 2016-02-24 MED ORDER — FUROSEMIDE 10 MG/ML IJ SOLN
20.0000 mg | Freq: Once | INTRAMUSCULAR | Status: AC
  Administered 2016-02-24: 20 mg via INTRAVENOUS
  Filled 2016-02-24: qty 2

## 2016-02-24 MED ORDER — METOPROLOL TARTRATE 25 MG PO TABS
12.5000 mg | ORAL_TABLET | Freq: Two times a day (BID) | ORAL | Status: DC
  Administered 2016-02-24 – 2016-02-27 (×8): 12.5 mg via ORAL
  Filled 2016-02-24 (×9): qty 1

## 2016-02-24 MED ORDER — SODIUM CHLORIDE 0.9 % IV SOLN
1.0000 g | Freq: Once | INTRAVENOUS | Status: AC
  Administered 2016-02-24: 1 g via INTRAVENOUS
  Filled 2016-02-24: qty 10

## 2016-02-24 MED ORDER — SODIUM CHLORIDE 0.9 % IV SOLN: 1.0000 g | Freq: Once | INTRAVENOUS | Status: DC

## 2016-02-24 MED ORDER — HYDROCORTISONE 2.5 % RE CREA
1.0000 | TOPICAL_CREAM | Freq: Two times a day (BID) | RECTAL | Status: DC
Start: 2016-02-24 — End: 2016-02-27
  Administered 2016-02-24 – 2016-02-27 (×6): 1 via RECTAL
  Filled 2016-02-24: qty 28.35

## 2016-02-24 MED ORDER — GUAIFENESIN-DM 100-10 MG/5ML PO SYRP
5.0000 mL | ORAL_SOLUTION | ORAL | Status: DC | PRN
  Administered 2016-02-25: 5 mL via ORAL
  Filled 2016-02-24: qty 5

## 2016-02-24 MED ORDER — SODIUM POLYSTYRENE SULFONATE 15 GM/60ML PO SUSP
30.0000 g | Freq: Once | ORAL | Status: AC
  Administered 2016-02-24: 30 g via ORAL
  Filled 2016-02-24: qty 120

## 2016-02-24 MED ORDER — FLUCONAZOLE 100 MG PO TABS
200.0000 mg | ORAL_TABLET | Freq: Once | ORAL | Status: AC
  Administered 2016-02-24: 200 mg via ORAL
  Filled 2016-02-24: qty 2

## 2016-02-24 MED ORDER — PHYTONADIONE 5 MG PO TABS
5.0000 mg | ORAL_TABLET | Freq: Once | ORAL | Status: AC
  Administered 2016-02-24: 5 mg via ORAL
  Filled 2016-02-24 (×2): qty 1

## 2016-02-24 MED ORDER — CEFEPIME HCL 1 G IJ SOLR
1.0000 g | Freq: Once | INTRAMUSCULAR | Status: AC
  Administered 2016-02-24: 1 g via INTRAVENOUS
  Filled 2016-02-24: qty 1

## 2016-02-24 MED ORDER — AMIODARONE HCL 200 MG PO TABS
200.0000 mg | ORAL_TABLET | Freq: Two times a day (BID) | ORAL | Status: DC
  Administered 2016-02-24 – 2016-02-28 (×9): 200 mg via ORAL
  Filled 2016-02-24 (×9): qty 1

## 2016-02-24 MED ORDER — ACETAMINOPHEN 650 MG RE SUPP: 650.0000 mg | Freq: Four times a day (QID) | RECTAL | Status: DC | PRN

## 2016-02-24 MED ORDER — LORATADINE 10 MG PO TABS
10.0000 mg | ORAL_TABLET | Freq: Every day | ORAL | Status: DC
  Administered 2016-02-24 – 2016-02-27 (×4): 10 mg via ORAL
  Filled 2016-02-24 (×4): qty 1

## 2016-02-24 MED ORDER — VANCOMYCIN HCL 10 G IV SOLR
1500.0000 mg | Freq: Once | INTRAVENOUS | Status: DC
  Administered 2016-02-24: 1500 mg via INTRAVENOUS
  Filled 2016-02-24: qty 1500

## 2016-02-24 MED ORDER — SODIUM BICARBONATE 650 MG PO TABS
1300.0000 mg | ORAL_TABLET | Freq: Three times a day (TID) | ORAL | Status: DC
  Administered 2016-02-24 – 2016-02-27 (×8): 1300 mg via ORAL
  Filled 2016-02-24 (×8): qty 2

## 2016-02-24 MED ORDER — PANTOPRAZOLE SODIUM 40 MG PO TBEC
40.0000 mg | DELAYED_RELEASE_TABLET | Freq: Every day | ORAL | Status: DC
  Administered 2016-02-24 – 2016-02-27 (×4): 40 mg via ORAL
  Filled 2016-02-24 (×4): qty 1

## 2016-02-24 MED ORDER — ACETAMINOPHEN 325 MG PO TABS: 650.0000 mg | ORAL_TABLET | Freq: Four times a day (QID) | ORAL | Status: DC | PRN

## 2016-02-24 MED ORDER — CALCITRIOL 0.25 MCG PO CAPS
0.2500 ug | ORAL_CAPSULE | ORAL | Status: DC
  Administered 2016-02-25 – 2016-02-27 (×2): 0.25 ug via ORAL
  Filled 2016-02-24 (×2): qty 1

## 2016-02-24 MED ORDER — LEVOTHYROXINE SODIUM 112 MCG PO TABS
112.0000 ug | ORAL_TABLET | Freq: Every day | ORAL | Status: DC
  Administered 2016-02-25 – 2016-02-28 (×4): 112 ug via ORAL
  Filled 2016-02-24 (×4): qty 1

## 2016-02-24 MED ORDER — FLUCONAZOLE 100 MG PO TABS
100.0000 mg | ORAL_TABLET | Freq: Every day | ORAL | Status: DC
  Administered 2016-02-25 – 2016-02-28 (×4): 100 mg via ORAL
  Filled 2016-02-24 (×4): qty 1

## 2016-02-24 MED ORDER — ATORVASTATIN CALCIUM 40 MG PO TABS
40.0000 mg | ORAL_TABLET | Freq: Every day | ORAL | Status: DC
  Administered 2016-02-24 – 2016-02-27 (×4): 40 mg via ORAL
  Filled 2016-02-24 (×4): qty 1

## 2016-02-24 MED ORDER — ONDANSETRON HCL 4 MG/2ML IJ SOLN: 4.0000 mg | Freq: Three times a day (TID) | INTRAMUSCULAR | Status: AC | PRN

## 2016-02-24 NOTE — ED Notes (Signed)
Pt taken to xray 

## 2016-02-24 NOTE — H&P (Signed)
Date: 02/24/2016               Patient Name:  [name] MRN: [id number]  DOB: [birth year] Age / Sex: 81 y.o., female   PCP: Stacy Falcon, MD         Medical Service: Internal Medicine Teaching Service         Attending Physician: Dr. Virgel Manifold, MD    First Contact: Dr. Velna Mosley Pager: [phone]  Second Contact: Dr. Jule Mosley  Pager: [phone]       After Hours (After 5p/  First Contact Pager: [phone]  weekends / holidays): Second Contact Pager: [phone]   Chief Complaint: SOB  History of Present Illness: Patient is a 81 yo F with pmhx significant for HTN, DM II, HFrEF (20-25%), chronic atrial fibrillation, CVA, and CKD stage V who presents with worsening SOB and decreased PO intake. Patient was recently admitted for E coli bacteremia due to UTI and discharged on 02/19/16. During her prior hospitalization, she was hypotensive due to sepsis at her home lasix and metoprolol were held. She remained euvolemic and her lasix was restarted at a lower dose (20 mg BID) at discharge with plans for close outpatient follow up. Her warfarin was also discontinued due to lability of her INRs. Her bleeding risk was deemed to out weight the benefit of anticoagulation and she was discharged on ASA 81 daily.   History was provided entirely by the patient's daughter. [name] began to experience difficulty with her breathing on Wednesday, the day after her discharge. Patient has been complaining of worsening dyspnea and orthopnea. She has also had a non productive cough over this time period, but daughter denies fevers at home. Daughter says that patient has not been eating at home. She has been complaining of nausea and loss of appetite and will only eat ice chips and drink water. She will not eat any solid food. She followed up with her PCP on Friday of last week and was told that her lungs were clear at that time and that she was sating well on room air. Her blood was checked and found to  have an elevated INR. She took one dose of po Vit K at home as instructed. Daughter says she has not been taking warfarin and that home health has been helping to manage her medications. On ROS, daughter endorsed both urinary and fecal incontinence that have been worsening since the end of August. Patient was fully functional and independent with her ADLs until August 30th when she was hospitalized for hypotension and worsening renal function. This is her third hospitalization since that time.   In the ED, patient was intermittently hypoxic to the low 80s when speaking, but sating 95% on RA while laying still. BP 117/75, HR 92, and RR 18. Oxygen saturation improved to 100% on 2L [place]. CXR was significant for enlarged cardiac silhouette read as concerning for cardiomegaly vs. Pericardial effusion, a left retrocardiac consolidation likely left lower lobe collapse vs PNA, and interstitial pulmonary edema with bilateral pleural effusions. Labs significant for CBC with a normal white count (9.8) and hgb 9.9 at baseline. I-stat Chem 8 with potassium 5.6, creatinine 6.9 / BUN 67 both at baseline. BNP 986 (238 one month ago). INR 7.26. UA pending.  Meds:  Current Meds  Medication Sig  . acetaminophen (TYLENOL) 325 MG tablet Take 2 tablets (650 mg total) by mouth every 6 (six) hours as needed for mild pain (or Fever >/=  101). (Patient taking differently: Take 650 mg by mouth every 6 (six) hours as needed (pain/ fever >101). )  . amiodarone (PACERONE) 200 MG tablet Take 1 tablet (200 mg total) by mouth 2 (two) times daily.  Marland Kitchen aspirin EC 81 MG EC tablet Take 1 tablet (81 mg total) by mouth daily.  Marland Kitchen atorvastatin (LIPITOR) 40 MG tablet Take 1 tablet (40 mg total) by mouth daily.  . calcitRIOL (ROCALTROL) 0.25 MCG capsule Take 1 capsule by mouth every Monday, Wednesday, and Friday.  . cetirizine (ZYRTEC) 10 MG chewable tablet Chew 1 tablet (10 mg total) by mouth daily.  . furosemide (LASIX) 20 MG tablet Take 1 tablet  (20 mg total) by mouth 2 (two) times daily.  Marland Kitchen guaiFENesin (MUCINEX) 600 MG 12 hr tablet Take 1 tablet (600 mg total) by mouth 2 (two) times daily.  Marland Kitchen guaiFENesin-dextromethorphan (ROBITUSSIN DM) 100-10 MG/5ML syrup Take 5 mLs by mouth every 4 (four) hours as needed for cough.  . hydrocortisone (ANUSOL-HC) 2.5 % rectal cream Place 1 application rectally 2 (two) times daily.  Marland Kitchen levothyroxine (SYNTHROID, LEVOTHROID) 112 MCG tablet Take 1 tablet (112 mcg total) by mouth daily before breakfast.  . lidocaine (LMX) 4 % cream Apply topically 2 (two) times daily as needed (rectal pain).  . metoprolol tartrate (LOPRESSOR) 25 MG tablet Take 0.5 tablets (12.5 mg total) by mouth 2 (two) times daily.  . ondansetron (ZOFRAN) 4 MG tablet Take 1 tablet (4 mg total) by mouth every 8 (eight) hours as needed for nausea or vomiting.  . pantoprazole (PROTONIX) 40 MG tablet TAKE 1 TABLET BY MOUTH EVERY DAY (Patient taking differently: TAKE 1 TABLET BY MOUTH EVERY DAY BEFORE BREAKFAST)  . potassium chloride SA (K-DUR,KLOR-CON) 20 MEQ tablet Take 1 tablet (20 mEq total) by mouth daily.  . sennosides-docusate sodium (SENOKOT-S) 8.6-50 MG tablet Take 2 tablets by mouth daily as needed for constipation.  . sodium bicarbonate 650 MG tablet Take 2 tablets (1,300 mg total) by mouth 3 (three) times daily.  Marland Kitchen witch hazel-glycerin (TUCKS) pad Apply topically as needed for itching.     Allergies: Allergies as of 02/24/2016  . (No Known Allergies)   Past Medical History:  Diagnosis Date  . Anemia    b12 def, iron def, follow at cancer center, gets B12 and  another injection there. Could not remeber the name. Dr. Ralene Ok is her  cancer doctor  . Asthma   . Atrial fibrillation ([place])   . Blood transfusion    two or more yrs ago  . Chronic renal insufficiency   . Chronic venous insufficiency   . Diabetes mellitus   . Hyperlipidemia   . Hypertension   . Hypothyroidism    thyroid removed 4 or more yrs ago  . MGUS  (monoclonal gammopathy of unknown significance)   . Osteomyelitis ([place])    s/p Rt 2nd toe and left 5 toes  amputation in 1/12  by Dr. Sharol Given  . Stroke ([place])   . Thyroid disease    hypothyroidism h/o hyperthyroidism s/p ablation/ectomy    Family History:  Family History  Problem Relation Age of Onset  . Anemia Mother   . Colon cancer Mother     rectal  . HIV Brother   . Cancer Brother     Social History: Lives at home alone however daughter has been staying with her since recent discharge. She has home full home health services and was recently started on outpatient palliative care. She does not drink alcohol  and denies tobacco use. She is DNR.   Review of Systems: A complete ROS was negative except as per HPI.   Physical Exam: Blood pressure 124/76, pulse 102, temperature 97.5 F (36.4 C), temperature source Oral, resp. rate 24, SpO2 (!) 88 %. Constitutional: NAD, appears comfortable HEENT: Atraumatic, normocephalic. PERRL, anicteric sclera. Diffuse white plaques on tongue and buccal mucosa, easily removed with tongue depressor.  Cardiovascular: Tachycardic to 101 and irregular, no murmurs, rubs, or gallops.  Pulmonary/Chest: Decreased breath sounds bilateral lower lobes (L>R) , no wheezes, rales, or rhonchi. No chest wall abnormalities.  Abdominal: Soft, non tender, non distended. +BS. Small periumbilical hernia. Superficial ecchymosis.  GU: Stage I decubitus sacral ulcer, one external hemorrhoid, moisture associated skin breakdown between inner thighs  Extremities: Warm and well perfused. Distal pulses intact. No edema. Bilateral toe amputations  Neurological: A&Ox3, CN II - XII grossly intact.  Skin: No rashes or erythema  Psychiatric: Normal mood and affect  EKG: Personally reviewed, low voltage ECG. Atrial fibrillation with a ventricular rate of 96. Left axis deviation, abnormal R wave progression in precordial leads. Intraventricular conduction delay. Largely unchanged from  prior tracing. No acute ischemic changes.   CXR: Personally reviewed. Enlarged cardiac silhouette, bilateral pleural effusions (L>R). Diffuse interstitial edema and possible left lobe consolidation.   Assessment & Plan by Problem:  Hypervolemia: In the setting of CHF (EF 20-25%) and CKD stage 5. Patient presents with worsening dyspnea and found to have pulmonary edema on CXR. Intermittently hypoxic to the low 80s and BNP elevated to 986 from 238 one month ago. No lower extremity edema. Lasix recently decreased during prior hospitalization due to hypotension and sepsis.  -- Will give 20 mg IV lasix once, then reassess due to soft BPs and renal dysfunction  -- Supplemental oxygen prn for oxygen < 94% -- Daily weights -- Fluid restrict  Oropharyngeal Candidiasis: Likely contributing to patient's poor PO intake over the past week.  -- Fluconazole 200 mg today, 100 mg daily starting tomorrow   HCAP: Possible left lobe consolidation in the setting of recent hospitalization, however patient is afebrile without leukocytosis. Decreased breath sounds bilaterally on exam (L>R). She endorses a non productive cough. Will reassess clinically for the need to continue antibiotic tomorrow after diuresis. -- Pharmacy consult for antibiotics, appreciate recommendations  -- S/p IV cefepime x 1 in ED  Supratherapeutic INR: Previously on warfarin for stroke prophylaxis in the setting of chronic a-fib. Warfarin discontinued during last hospitalization due to lability of INRs. Per daughter, patient was referred to a specialist for inappropriately high INRs after stopping warfarin.  -- S/p 5 mg PO vit K in ED -- Daily INR, continue to monitor  -- Hold home ASA 81  Fecal Incontinence: S/p kayexalate in ED. However daughter says this has been going on since September. Will rule out c-diff given recent IV antibiotic course.  -- C-diff toxin pending   CKD Stage V: Not a dialysis candidate. Labs at baseline compared to  last admission and patient continues to have good UOP. Daughter endorses urinary incontinence but denies dysuria or foul smelling urine. Will check UA given recent E coli bacteremia due to UTI. Worsening renal function over the past few months could be contributing to patient's nausea and poor appetite. Will continue to monitor.  -- Strict I/Os and daily weights -- Fluid restrict  -- Continue home calcimimetic and PO bicarb  -- Avoid nephrotoxic meds -- UA pending   Chronic Combined CHF: LV EF of 20-25% during  last hospitalization with severe biventricular dysfunction. Baseline weight on discharge was 178 lbs. Will recheck weight this admission.  -- Strict I/Os and daily weights -- Fluid restrict  -- No ACEI/ARB due to CKD -- Continue metoprolol 12.5 BID  Atrial Fibrillation: Chronic, rates currently well controlled.  -- Cardiac monitoring  -- Continue metoprolol 12.5 mg BID, titrate as BP tolerates (previously on 75mg  XL daily at home) -- Continue Amiodarone 200 mg BID  Hypothyroidism: TSH elevated 39 and free T4 normal at recent office visit. Likely due to acute illness.  -- Continue home synthroid 112 mcg daily   Hyperkalemia: EKG without acute changes. Patient deneis chest pain.  -- S/p kayexalate in ED -- Recheck AM labs  HLD: -- Continue home Lipitor 40 mg  Hemorrhoids:  -- Hydrocortisone rectal cream BID  GERD: -- Continue Protonix 40 mg daily   FEN: Fluid restrict, s/p kayexalate in ED, thin diet VTE ppx: SCDs Code Status: DNR   Dispo: Admit patient to Inpatient with expected length of stay greater than 2 midnights.  Signed: Velna Ochs, MD 02/24/2016, 10:30 AM  Pager: [phone]

## 2016-02-24 NOTE — ED Notes (Signed)
Attempted to call report

## 2016-02-24 NOTE — ED Notes (Signed)
Iv attempted wihtout success x1

## 2016-02-24 NOTE — ED Notes (Signed)
Pt cleaned of large amount liquid/solid stool. Admitting MD at bedside

## 2016-02-24 NOTE — ED Provider Notes (Signed)
[facility] DEPT Provider Note   CSN: [id number] Arrival date & time: 02/24/16  0554     History   Chief Complaint Chief Complaint  Patient presents with  . Shortness of Breath    HPI [name] is a 81 y.o. female.  HPI   81 year old female with dyspnea. Progressively worsening over the last few days. She was recently admitted for urosepsis. She went to a rehabilitation facility for couple weeks prior to coming home about a week ago. Family reports that she's been generally weak. Poor appetite. Persistent nonproductive cough. She began complaining shortness breath for last couple days. No fevers or chills. She denies any acute pain.  Past Medical History:  Diagnosis Date  . Anemia    b12 def, iron def, follow at cancer center, gets B12 and  another injection there. Could not remeber the name. Dr. Ralene Ok is her  cancer doctor  . Asthma   . Atrial fibrillation ([place])   . Blood transfusion    two or more yrs ago  . Chronic renal insufficiency   . Chronic venous insufficiency   . Diabetes mellitus   . Hyperlipidemia   . Hypertension   . Hypothyroidism    thyroid removed 4 or more yrs ago  . MGUS (monoclonal gammopathy of unknown significance)   . Osteomyelitis ([place])    s/p Rt 2nd toe and left 5 toes  amputation in 1/12  by Dr. Sharol Given  . Stroke ([place])   . Thyroid disease    hypothyroidism h/o hyperthyroidism s/p ablation/ectomy    Patient Active Problem List   Diagnosis Date Noted  . Decubitus ulcer of sacral region, stage 1 02/17/2016  . Biventricular heart failure   . Chronic systolic heart failure ([place])   . E. coli sepsis ([place])   . Supratherapeutic INR   . UTI (lower urinary tract infection) 02/08/2016  . Palliative care encounter   . Goals of care, counseling/discussion   . DNR (do not resuscitate)   . Cardiorenal syndrome   . Chronic atrial fibrillation ([place])   . Papular rash 09/19/2015  . Ulcer of left lower extremity ([place]) 09/19/2015  . Benign  neoplasm of transverse colon 12/17/2014  . Gastritis   . Melena 12/12/2014  . UGI bleed 12/12/2014  . Absolute anemia   . CVA (cerebral vascular accident) ([place]) 10/01/2014  . TIA (transient ischemic attack) 09/30/2014  . Arthralgia of both hands 08/04/2014  . Preventative health care 05/03/2014  . Facet arthropathy of spine 04/26/2014  . Vitamin D deficiency 11/03/2012  . External hemorrhoids 06/28/2012  . Intermittent sleepiness 06/28/2012  . Anemia associated with chronic renal failure 04/03/2011  . MGUS (monoclonal gammopathy of unknown significance) 04/03/2011  . Atrial fibrillation ([place]) 01/24/2011  . History of osteomyelitis 09/05/2009  . Hypothyroidism 02/01/2007  . Diabetes mellitus with neuropathy ([place]) 07/03/2006  . CKD (chronic kidney disease), stage V ([place]) 07/03/2006  . Hyperlipidemia 03/02/2006  . Essential hypertension 03/02/2006  . VENOUS INSUFFICIENCY 03/02/2006  . EDEMA LEG 03/02/2006  . History of amputation of lesser toe ([place]) 03/02/2006    Past Surgical History:  Procedure Laterality Date  . AMPUTATION  04/30/2011   Procedure: AMPUTATION FOOT;  Surgeon: Newt Minion, MD;  Location: [facility];  Service: Orthopedics;  Laterality: Right;  Right Midfoot Amputation  . APPENDECTOMY     teenager  . COLONOSCOPY N/A 12/17/2014   Procedure: COLONOSCOPY;  Surgeon: Ladene Artist, MD;  Location: [facility] ENDOSCOPY;  Service: Endoscopy;  Laterality: N/A;  .  ESOPHAGOGASTRODUODENOSCOPY N/A 12/15/2014   Procedure: ESOPHAGOGASTRODUODENOSCOPY (EGD);  Surgeon: Ladene Artist, MD;  Location: [facility] ENDOSCOPY;  Service: Endoscopy;  Laterality: N/A;  . EYE SURGERY     cat ext ou    OB History    No data available       Home Medications    Prior to Admission medications   Medication Sig Start Date End Date Taking? Authorizing Provider  acetaminophen (TYLENOL) 325 MG tablet Take 2 tablets (650 mg total) by mouth every 6 (six) hours as needed for mild pain (or Fever >/=  101). Patient taking differently: Take 650 mg by mouth every 6 (six) hours as needed (pain/ fever >101).  01/25/16  Yes Alexa Angela Burke, MD  amiodarone (PACERONE) 200 MG tablet Take 1 tablet (200 mg total) by mouth 2 (two) times daily. 02/19/16  Yes Alexa Angela Burke, MD  aspirin EC 81 MG EC tablet Take 1 tablet (81 mg total) by mouth daily. 02/20/16  Yes Alexa Angela Burke, MD  atorvastatin (LIPITOR) 40 MG tablet Take 1 tablet (40 mg total) by mouth daily. 02/19/16  Yes Alexa Angela Burke, MD  calcitRIOL (ROCALTROL) 0.25 MCG capsule Take 1 capsule by mouth every Monday, Wednesday, and Friday. 04/10/14  Yes Historical Provider, MD  cetirizine (ZYRTEC) 10 MG chewable tablet Chew 1 tablet (10 mg total) by mouth daily. 02/19/16  Yes Alexa Angela Burke, MD  furosemide (LASIX) 20 MG tablet Take 1 tablet (20 mg total) by mouth 2 (two) times daily. 02/19/16  Yes Alexa Angela Burke, MD  guaiFENesin (MUCINEX) 600 MG 12 hr tablet Take 1 tablet (600 mg total) by mouth 2 (two) times daily. 02/19/16  Yes Alexa Angela Burke, MD  guaiFENesin-dextromethorphan (ROBITUSSIN DM) 100-10 MG/5ML syrup Take 5 mLs by mouth every 4 (four) hours as needed for cough. 02/19/16  Yes Alexa Angela Burke, MD  hydrocortisone (ANUSOL-HC) 2.5 % rectal cream Place 1 application rectally 2 (two) times daily. 02/19/16  Yes Alexa Angela Burke, MD  levothyroxine (SYNTHROID, LEVOTHROID) 112 MCG tablet Take 1 tablet (112 mcg total) by mouth daily before breakfast. 02/19/16  Yes Alexa Angela Burke, MD  lidocaine (LMX) 4 % cream Apply topically 2 (two) times daily as needed (rectal pain). 02/19/16  Yes Alexa Angela Burke, MD  metoprolol tartrate (LOPRESSOR) 25 MG tablet Take 0.5 tablets (12.5 mg total) by mouth 2 (two) times daily. 02/19/16  Yes Alexa Angela Burke, MD  ondansetron (ZOFRAN) 4 MG tablet Take 1 tablet (4 mg total) by mouth every 8 (eight) hours as needed for nausea or vomiting. 02/19/16  Yes Alexa Angela Burke, MD  pantoprazole (PROTONIX) 40 MG tablet TAKE 1 TABLET BY MOUTH EVERY DAY Patient taking  differently: TAKE 1 TABLET BY MOUTH EVERY DAY BEFORE BREAKFAST 07/07/15  Yes [name], MD  potassium chloride SA (K-DUR,KLOR-CON) 20 MEQ tablet Take 1 tablet (20 mEq total) by mouth daily. 02/19/16  Yes Alexa Angela Burke, MD  sennosides-docusate sodium (SENOKOT-S) 8.6-50 MG tablet Take 2 tablets by mouth daily as needed for constipation. 02/19/16  Yes Alexa Angela Burke, MD  sodium bicarbonate 650 MG tablet Take 2 tablets (1,300 mg total) by mouth 3 (three) times daily. 02/19/16 03/20/16 Yes Alexa Angela Burke, MD  witch hazel-glycerin (TUCKS) pad Apply topically as needed for itching. 02/19/16  Yes Alexa Angela Burke, MD    Family History Family History  Problem Relation Age of Onset  . Anemia Mother   . Colon cancer Mother     rectal  .  HIV Brother   . Cancer Brother     Social History Social History  Substance Use Topics  . Smoking status: Never Smoker  . Smokeless tobacco: Never Used  . Alcohol use No     Allergies   Review of patient's allergies indicates no known allergies.   Review of Systems Review of Systems  All systems reviewed and negative, other than as noted in HPI.   Physical Exam Updated Vital Signs BP 124/76   Pulse 102   Temp 97.5 F (36.4 C) (Oral)   Resp 24   SpO2 (!) 88%   Physical Exam  Constitutional: She appears well-developed and well-nourished. No distress.  Laying in bed. Appears tired, but nondistressed.  HENT:  Head: Normocephalic and atraumatic.  Eyes: Conjunctivae are normal. Right eye exhibits no discharge. Left eye exhibits no discharge.  Neck: Neck supple.  Cardiovascular: Regular rhythm and normal heart sounds.  Exam reveals no gallop and no friction rub.   No murmur heard. irreg irreg  Pulmonary/Chest: No respiratory distress.  Mild tachypnea. Decreased breath sounds b/l  Abdominal: Soft. She exhibits no distension. There is no tenderness.  Musculoskeletal: She exhibits edema. She exhibits no tenderness.  mild symmetric edema. B/l partial  foot amputations  Neurological: She is alert.  Skin: Skin is warm and dry.  Psychiatric: She has a normal mood and affect. Her behavior is normal. Thought content normal.  Nursing note and vitals reviewed.    ED Treatments / Results  Labs (all labs ordered are listed, but only abnormal results are displayed) Labs Reviewed  CBC WITH DIFFERENTIAL/PLATELET - Abnormal; Notable for the following:       Result Value   Hemoglobin 9.9 (*)    HCT 35.7 (*)    MCH 24.9 (*)    MCHC 27.7 (*)    RDW 25.5 (*)    Platelets 89 (*)    All other components within normal limits  PROTIME-INR - Abnormal; Notable for the following:    Prothrombin Time 64.4 (*)    INR 7.26 (*)    All other components within normal limits  I-STAT CHEM 8, ED - Abnormal; Notable for the following:    Potassium 5.6 (*)    BUN 67 (*)    Creatinine, Ser 6.90 (*)    Glucose, Bld 177 (*)    Calcium, Ion 0.71 (*)    Hemoglobin 11.9 (*)    HCT 35.0 (*)    All other components within normal limits  URINALYSIS, ROUTINE W REFLEX MICROSCOPIC (NOT AT [facility])  BRAIN NATRIURETIC PEPTIDE  I-STAT TROPOININ, ED    EKG  EKG Interpretation  Date/Time:  Sunday February 24 2016 05:55:03 EDT Ventricular Rate:  97 PR Interval:    QRS Duration: 116 QT Interval:  411 QTC Calculation: 523 R Axis:   -25 Text Interpretation:  Atrial fibrillation non specific st t changes Confirmed by [facility]  MD, APRIL ([id number]) on 02/24/2016 6:09:38 AM       Radiology Dg Chest 2 View  Result Date: 02/24/2016 CLINICAL DATA:  Shortness of breath EXAM: CHEST  2 VIEW COMPARISON:  Chest radiograph 02/12/2016 FINDINGS: Cardiomediastinal silhouette is enlarged. There is atherosclerotic calcification within the aortic arch. There are diffusely increased pulmonary markings and coarse interstitial opacities. There is left retrocardiac consolidation. There is a small right pleural effusion and a medium-sized left pleural effusion. No pneumothorax. Upper  mediastinal surgical clips are noted. IMPRESSION: 1. Enlarged cardiomediastinal silhouette, cardiomegaly alone versus pericardial effusion. 2. Left retrocardiac consolidation, favored  to be left lower lobe collapse. Pneumonia would be difficult to exclude. 3. Interstitial pulmonary edema with small right and medium-sized left pleural effusions. 4. Aortic atherosclerosis. Electronically Signed   By: Ulyses Jarred M.D.   On: 02/24/2016 06:31    Procedures Procedures (including critical care time)  Medications Ordered in ED Medications  phytonadione (VITAMIN K) tablet 5 mg (not administered)  ceFEPIme (MAXIPIME) 1 g in dextrose 5 % 50 mL IVPB (not administered)  vancomycin (VANCOCIN) 1,500 mg in sodium chloride 0.9 % 500 mL IVPB (not administered)  albuterol (PROVENTIL) (2.5 MG/3ML) 0.083% nebulizer solution 5 mg (5 mg Nebulization Given 02/24/16 0634)  calcium gluconate 1 g in sodium chloride 0.9 % 100 mL IVPB (1 g Intravenous New Bag/Given 02/24/16 0923)  sodium polystyrene (KAYEXALATE) 15 GM/60ML suspension 30 g (30 g Oral Given 02/24/16 [id number])     Initial Impression / Assessment and Plan / ED Course  I have reviewed the triage vital signs and the nursing notes.  Pertinent labs & imaging results that were available during my care of the patient were reviewed by me and considered in my medical decision making (see chart for details).  Clinical Course    80yF with increasing dyspnea. Nonproductive cough. Mild intermittent hypoxemia. CXR with interstitial edema and retrocardiac opacity. Will cover with abx for possible HCAP. Doubt ACS. Atypical symptoms. Denies pain. Normal troponin. Very unlikely PE with supratherapeutic INR. Edema in setting of advanced CKD. Per review of recent notes, it is felt that she is not a good dialysis candidate. Mild hyperkalemia w/o acute appearing EKG changes. Given some Ca and kayexalate. INR remains persistently elevated despite recently stopping coumadin and  vitamin K dosing. Will give additional oral vitamin K. No overt bleeding. H/H stable.   Final Clinical Impressions(s) / ED Diagnoses   Final diagnoses:  Dyspnea, unspecified type  Stage 5 chronic kidney disease not on chronic dialysis ([place])  Elevated international normalized ratio (INR)    New Prescriptions New Prescriptions   No medications on file     Virgel Manifold, MD 02/24/16 1043

## 2016-02-24 NOTE — ED Notes (Signed)
Iv tweam remains at bedside

## 2016-02-24 NOTE — ED Notes (Signed)
Dr Randal Buba given a copy of Chem 8 results

## 2016-02-24 NOTE — ED Triage Notes (Signed)
Pt from home c/o [facility] started yesterday and woke patient up tonight. SHOB unlabored

## 2016-02-24 NOTE — Assessment & Plan Note (Addendum)
Patient with history of CHF with recent change in her management; discharge home with lasix 20mg  BID due to low-normal BP's in hospital after hypotensive episode. Patient's volume status appears stable; family unable to weight patient at home due to not being able to support herself and the same issue in clinic. Patient has not been taking in very much PO except sips of water and ensure.   Plan: --check BMP - Cr increased from b/l to 6.36, K 5.2, Na 150 likely due to decreased PO intake. --continue lasix 20mg  BID and metoprolol tartrate 12.5mg  BID --patient to begin getting home PT on 10/7 for deconditioning

## 2016-02-24 NOTE — Assessment & Plan Note (Addendum)
Patient with A fib that was supratherapeutic on warfarin requiring multiple dosing of vitamin K. On hospital discharge, warfarin was held, and patient was started on ASA 81mg  for stroke prophylaxis. No signs or symptoms of bleeding. On amiodarone 200mg  BID and metoprolol 12.5mg  BID.  Plan: --check INR - 6.1 --called family - hold aspirin, prescribed 5mg  vitamin K to be picked up and given to patient ASAP; family aware to monitor for signs and symptoms of bleeding and to take patient to ED if these develop --scheduled for coumadin clinic with Dr. Elie Confer on 10/9 at 11am --continue amiodarone and metoprolol

## 2016-02-24 NOTE — Progress Notes (Addendum)
Pharmacy Antibiotic Note  [name] is a 81 y.o. female admitted on 02/24/2016 with SOB.  Pharmacy has been consulted to manage vancomycin for PNA.  First dose of cefepime already ordered.  She has CKD Stage 4 at baseline.  Currently afebrile and WBC is WNL..   Plan: - Vanc 1500mg  IV x 1.  Dose per level. - Monitor renal fxn, clinical progress - F/U with continuation of Gram negative coverage     Temp (24hrs), Avg:97.5 F (36.4 C), Min:97.5 F (36.4 C), Max:97.5 F (36.4 C)   Recent Labs Lab 02/22/16 1431 02/24/16 0606 02/24/16 0619  WBC  --  9.8  --   CREATININE 6.36*  --  6.90*    Estimated Creatinine Clearance: 7.4 mL/min (by C-G formula based on SCr of 6.9 mg/dL (H)).    No Known Allergies  Antimicrobials this admission:  Vanc 10/8 >> Cefepime  Dose adjustments this admission:  N/A  Microbiology results:  N/A   Jaysean Manville D. Mina Marble, PharmD, BCPS Pager:  [phone] 02/24/2016, 10:13 AM

## 2016-02-24 NOTE — Assessment & Plan Note (Addendum)
Patient with history of hypothyroidism on synthroid 130mcg. During hospitalization, TSH was found to be slightly elevated but thought due to acute illness. She continued receiving synthroid 148mcg and this was continued at discharge. Patient with fatigue since discharge consistent with hypothyroidism. This could be exacerbated by deconditioning due to hospitalization; does not appear to be CHF exacerbation currently based on volume status.  Plan: --check TSH, T4 - TSH further elevated at 39.33, T4 wnl - TSH likely elevated due to recent acute illness --continue synthroid 187mcg

## 2016-02-25 ENCOUNTER — Ambulatory Visit: Payer: Self-pay

## 2016-02-25 ENCOUNTER — Other Ambulatory Visit: Payer: Self-pay

## 2016-02-25 DIAGNOSIS — E1122 Type 2 diabetes mellitus with diabetic chronic kidney disease: Secondary | ICD-10-CM

## 2016-02-25 DIAGNOSIS — Z89421 Acquired absence of other right toe(s): Secondary | ICD-10-CM

## 2016-02-25 DIAGNOSIS — R159 Full incontinence of feces: Secondary | ICD-10-CM

## 2016-02-25 DIAGNOSIS — E039 Hypothyroidism, unspecified: Secondary | ICD-10-CM

## 2016-02-25 DIAGNOSIS — L89151 Pressure ulcer of sacral region, stage 1: Secondary | ICD-10-CM

## 2016-02-25 DIAGNOSIS — Z794 Long term (current) use of insulin: Secondary | ICD-10-CM

## 2016-02-25 DIAGNOSIS — Z79899 Other long term (current) drug therapy: Secondary | ICD-10-CM

## 2016-02-25 DIAGNOSIS — N185 Chronic kidney disease, stage 5: Secondary | ICD-10-CM

## 2016-02-25 DIAGNOSIS — J189 Pneumonia, unspecified organism: Secondary | ICD-10-CM

## 2016-02-25 DIAGNOSIS — K649 Unspecified hemorrhoids: Secondary | ICD-10-CM

## 2016-02-25 DIAGNOSIS — E785 Hyperlipidemia, unspecified: Secondary | ICD-10-CM

## 2016-02-25 DIAGNOSIS — Z66 Do not resuscitate: Secondary | ICD-10-CM

## 2016-02-25 DIAGNOSIS — Z8619 Personal history of other infectious and parasitic diseases: Secondary | ICD-10-CM

## 2016-02-25 DIAGNOSIS — I132 Hypertensive heart and chronic kidney disease with heart failure and with stage 5 chronic kidney disease, or end stage renal disease: Principal | ICD-10-CM

## 2016-02-25 DIAGNOSIS — R197 Diarrhea, unspecified: Secondary | ICD-10-CM

## 2016-02-25 DIAGNOSIS — R791 Abnormal coagulation profile: Secondary | ICD-10-CM

## 2016-02-25 DIAGNOSIS — Y95 Nosocomial condition: Secondary | ICD-10-CM

## 2016-02-25 DIAGNOSIS — K219 Gastro-esophageal reflux disease without esophagitis: Secondary | ICD-10-CM

## 2016-02-25 DIAGNOSIS — B370 Candidal stomatitis: Secondary | ICD-10-CM

## 2016-02-25 DIAGNOSIS — Z89422 Acquired absence of other left toe(s): Secondary | ICD-10-CM

## 2016-02-25 DIAGNOSIS — Z87440 Personal history of urinary (tract) infections: Secondary | ICD-10-CM

## 2016-02-25 DIAGNOSIS — E875 Hyperkalemia: Secondary | ICD-10-CM

## 2016-02-25 DIAGNOSIS — I5023 Acute on chronic systolic (congestive) heart failure: Secondary | ICD-10-CM

## 2016-02-25 LAB — BASIC METABOLIC PANEL
Anion gap: 17 — ABNORMAL HIGH (ref 5–15)
BUN: 73 mg/dL — AB (ref 6–20)
CHLORIDE: 102 mmol/L (ref 101–111)
CO2: 28 mmol/L (ref 22–32)
Calcium: 7.4 mg/dL — ABNORMAL LOW (ref 8.9–10.3)
Creatinine, Ser: 7.29 mg/dL — ABNORMAL HIGH (ref 0.44–1.00)
GFR calc Af Amer: 6 mL/min — ABNORMAL LOW (ref >60)
GFR, EST NON AFRICAN AMERICAN: 5 mL/min — AB (ref >60)
GLUCOSE: 249 mg/dL — AB (ref 65–99)
POTASSIUM: 4.6 mmol/L (ref 3.5–5.1)
Sodium: 147 mmol/L — ABNORMAL HIGH (ref 135–145)

## 2016-02-25 LAB — HEPATIC FUNCTION PANEL
ALBUMIN: 2.5 g/dL — AB (ref 3.5–5.0)
ALK PHOS: 157 U/L — AB (ref 38–126)
ALT: 35 U/L (ref 14–54)
AST: 122 U/L — AB (ref 15–41)
BILIRUBIN TOTAL: 1.4 mg/dL — AB (ref 0.3–1.2)
Bilirubin, Direct: 0.6 mg/dL — ABNORMAL HIGH (ref 0.1–0.5)
Indirect Bilirubin: 0.8 mg/dL (ref 0.3–0.9)
Total Protein: 6.5 g/dL (ref 6.5–8.1)

## 2016-02-25 LAB — CBC
HCT: 32 % — ABNORMAL LOW (ref 36.0–46.0)
Hemoglobin: 9.1 g/dL — ABNORMAL LOW (ref 12.0–15.0)
MCH: 25.1 pg — ABNORMAL LOW (ref 26.0–34.0)
MCHC: 28.4 g/dL — ABNORMAL LOW (ref 30.0–36.0)
MCV: 88.4 fL (ref 78.0–100.0)
Platelets: 67 K/uL — ABNORMAL LOW (ref 150–400)
RBC: 3.62 MIL/uL — ABNORMAL LOW (ref 3.87–5.11)
RDW: 25.3 % — ABNORMAL HIGH (ref 11.5–15.5)
WBC: 6.8 K/uL (ref 4.0–10.5)

## 2016-02-25 LAB — BASIC METABOLIC PANEL WITH GFR
Anion gap: 18 — ABNORMAL HIGH (ref 5–15)
BUN: 70 mg/dL — ABNORMAL HIGH (ref 6–20)
CO2: 28 mmol/L (ref 22–32)
Calcium: 7.5 mg/dL — ABNORMAL LOW (ref 8.9–10.3)
Chloride: 100 mmol/L — ABNORMAL LOW (ref 101–111)
Creatinine, Ser: 6.82 mg/dL — ABNORMAL HIGH (ref 0.44–1.00)
GFR calc Af Amer: 6 mL/min — ABNORMAL LOW (ref >60)
GFR calc non Af Amer: 5 mL/min — ABNORMAL LOW (ref >60)
Glucose, Bld: 199 mg/dL — ABNORMAL HIGH (ref 65–99)
Potassium: 5.7 mmol/L — ABNORMAL HIGH (ref 3.5–5.1)
Sodium: 146 mmol/L — ABNORMAL HIGH (ref 135–145)

## 2016-02-25 LAB — GLUCOSE, CAPILLARY
Glucose-Capillary: 188 mg/dL — ABNORMAL HIGH (ref 65–99)
Glucose-Capillary: 263 mg/dL — ABNORMAL HIGH (ref 65–99)
Glucose-Capillary: 201 mg/dL — ABNORMAL HIGH (ref 65–99)
Glucose-Capillary: 194 mg/dL — ABNORMAL HIGH (ref 65–99)

## 2016-02-25 MED ORDER — FUROSEMIDE 10 MG/ML IJ SOLN
20.0000 mg | Freq: Once | INTRAMUSCULAR | Status: AC
  Administered 2016-02-25: 20 mg via INTRAVENOUS
  Filled 2016-02-25: qty 2

## 2016-02-25 MED ORDER — VANCOMYCIN 50 MG/ML ORAL SOLUTION
125.0000 mg | Freq: Four times a day (QID) | ORAL | Status: DC
  Administered 2016-02-25: 125 mg via ORAL
  Filled 2016-02-25 (×2): qty 2.5

## 2016-02-25 MED ORDER — DEXTROSE 50 % IV SOLN
50.0000 mL | Freq: Once | INTRAVENOUS | Status: AC
  Administered 2016-02-25: 50 mL via INTRAVENOUS
  Filled 2016-02-25: qty 50

## 2016-02-25 MED ORDER — INSULIN ASPART 100 UNIT/ML ~~LOC~~ SOLN
10.0000 [IU] | Freq: Once | SUBCUTANEOUS | Status: AC
  Administered 2016-02-25: 10 [IU] via SUBCUTANEOUS

## 2016-02-25 NOTE — Progress Notes (Signed)
Initial Nutrition Assessment  DOCUMENTATION CODES:   Severe malnutrition in context of chronic illness  INTERVENTION:    Ensure Enlive PO TID, each supplement provides 350 kcal and 20 grams of protein  NUTRITION DIAGNOSIS:   Malnutrition related to chronic illness as evidenced by severe depletion of muscle mass, percent weight loss (7% weight loss within 1 month).  GOAL:   Patient will meet greater than or equal to 90% of their needs  MONITOR:   PO intake, Supplement acceptance, Labs, Weight trends, I & O's  REASON FOR ASSESSMENT:   Malnutrition Screening Tool    ASSESSMENT:   Patient is a 81 yo F with pmhx significant for HTN, DM II, HFrEF (20-25%), chronic atrial fibrillation, CVA, and CKD stage V who presents with worsening SOB and decreased PO intake. Patient was recently admitted for E coli bacteremia due to UTI and discharged on 02/19/16.  Patient sleepy during RD visit. She was able to state that she has been eating poorly and has lost weight. Nutrition-Focused physical exam completed. Findings are mild-moderate fat depletion, mild-moderate and severe muscle depletion, and mild edema.  7% weight loss within the past month is significant. Patient with severe PCM. Labs reviewed; sodium & potassium are elevated. Medications reviewed.  Diet Order:  Diet Heart Room service appropriate? Yes; Fluid consistency: Thin  Skin:  Wound (see comment) (wound to L thigh due to MASD)  Last BM:  10/8  Height:   Ht Readings from Last 1 Encounters:  02/24/16 5\' 6"  ([id number] m)    Weight:   Wt Readings from Last 1 Encounters:  02/25/16 180 lb 8.9 oz (81.9 kg)    Ideal Body Weight:  59.1 kg  BMI:  Body mass index is 29.14 kg/m.  Estimated Nutritional Needs:   Kcal:  1600-1800  Protein:  100-110 gm  Fluid:  1.8-2 L  EDUCATION NEEDS:   No education needs identified at this time  Molli Barrows, [place], [place], [place] Pager [phone] After Hours Pager [phone]

## 2016-02-25 NOTE — Progress Notes (Signed)
MD notified about patient's low BP. No new orders at this time. Will continue to monitor.

## 2016-02-25 NOTE — Progress Notes (Signed)
Date: 02/25/2016  Patient name: [name]  Medical record number: [id number]  Date of birth: [birth year]   I have seen and evaluated Boris Sharper and discussed their care with the Residency Team. Briefly, Ms. Haseley is a 81yo woman with PMH of HTN, DM2, HFrEF (20-25%), CKD stage V, Afib, CVA who presented with worsening SOB.  She was recently admitted and treated for E.coli sepsis.  During her treatment her lasix and metoprolol were held.  She was also sent home on a lower dose of lasix.  On discharge, she subsequently developed SOB, difficulty lying flat and DOE.  She also had a non productive cough.  She has been having decreased PO intake for a while and at home she was not taking much in.  Further, she has an elevated INR continuously, despite no coumadin for over a week.  Further symptoms include urinary and fecal incontinence.  In the ED, she was noted to be hypoxic, which improved with oxygen.  She also had an elevated BNP, elevated potassium to 5.6.  CXR showed pulmonary edema and possibly a left lower consolidation.    Fam Hx, and/or Soc Hx : Family history reviewed and is non contributory to this current admission.  Lives with daughter.    Vitals:   02/25/16 1419 02/25/16 1533  BP: (!) 83/52 (!) 86/52  Pulse: 83 73  Resp: 20 19  Temp: 97.9 F (36.6 C)    Gen: Elderly woman, somewhat SOB with speaking HENT: Improving plaques on buccal mucosa CV: Mild tachycardia, irreg irreg, no murmur Pulm: Crackles in the bases, worse on the left, no wheezing Abd: Soft, NT, N+BS Ext: bilateral toe amputations, chronic GU: Stage 1 decubitus sacral ulcer Skin: Sacral ulcer, no rash   Assessment and Plan: I have seen and evaluated the patient as outlined above. I agree with the formulated Assessment and Plan as detailed in the residents' note, with the following changes:   1. Acute CHF exacerbation with concomitant CKD stage V - BNP elevated - CXR with edema - Hypoxia, improved with O2 -  O2 to keep saturation > 92% - Lasix IV X 1 given yesterday, BP dropped due to it.  Another 1 time dose of lasix will be given this AM - Repeat CXR in the AM - Daily weights, strict I/O if possible  2. OP candidiasis - fluconazole, improving  3. ? HCAP - Repeat CXR in the AM, given diarrhea, would hold further Abx at this time - No leukocytosis, no fever  4. Diarrhea, fecal incontinence - Cdiff PCR negative - Consider loperamide if continues to be an issue  5. Supratherapeutic INR - AST elevated, possibly related to congestion.  Also with elevated bilirubin is suggestive of congestive hepatopathy - HCV negative this year - Give more vitamin K - Monitor for bleeding - If bleeding, consider FFP  6. Hyperkalemia - Kayexalate in the ED - Possibly a sign of worsening renal function - Insulin 10 units with D50 today given her diarrhea/fecal incontinence - Albuterol PRN  Other issues noted in Dr. Rivka Safer daily note.   I discussed her overall poor prognosis with her daughter [name] on the phone today.  I am concerned that she is in end stage heart failure with concomitant CKD.  She is DNR.  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  She has persistent SOB.  She also has low PO intake which could be  related to poor cardiac output as well.  Our goal will be to make her comfortable as regards her breathing as [name] and I discussed.  She may need hospice.  Will further evaluate in the morning.    [name], MD 10/9/20173:50 PM

## 2016-02-25 NOTE — Progress Notes (Signed)
Patient empirically started on vancomycin oral solution awaiting PCR due to C. diff quick scan yielding indeterminate results. PCR returned negative, noting colonization with non-toxigenic C. diff. Shared findings with Dr. Philipp Ovens; she plans to subsequently stop the vancomycin.  Annabelle Harman, PharmD Candidate, [place], PharmD Candidate, [place] Alena Bills, PharmD Candidate, [place]

## 2016-02-25 NOTE — Assessment & Plan Note (Signed)
Patient with labile INR, found to be supratherapeutic >>10INR during hospitalization. Her warfarin was discontinued and she is on ASA 81mg  for stroke prevention for A fib. Patient and family deny signs or symptoms of bleeding.  Plan: --Check INR - 6.10 --called patient's family - prescribed 5mg  vitamin K to be given to patient ASAP; hold aspirin; family stated understanding to take patient to ED if signs of bleeding appear --scheduled patient to be seen by Dr. Elie Confer on 10/09

## 2016-02-25 NOTE — Care Management Note (Signed)
Case Management Note  Patient Details  Name: [name] MRN: [id number] Date of Birth: 03-06-1935  Subjective/Objective:           Patient from home with family. Recent discharge to home with [facility] PT OT  RN through [facility] at home, and DME hospital bed and WC.   PCS through [facility] by [place].    PCP Dr. Daryll Drown    Action/Plan:  CM will continue to follow for DC planning.  Expected Discharge Date:                  Expected Discharge Plan:  [place]  In-House Referral:     Discharge planning Services  CM Consult  Post Acute Care Choice:    Choice offered to:     DME Arranged:    DME Agency:     HH Arranged:    [place] Agency:     Status of Service:  In process, will continue to follow  If discussed at Long Length of Stay Meetings, dates discussed:    Additional Comments:  Carles Collet, RN 02/25/2016, 10:58 AM

## 2016-02-25 NOTE — Consult Note (Addendum)
[place] Nurse wound consult note Reason for Consult: sacral pressure injury Wound type: Stage 2 pressure injury to left gluteal fold Pressure Ulcer POA: Yes Measurement: 0.6 x 0.5 cm Wound bed: 100% pink, red,raw.  Moisture associate contact dermatitis to buttocks and groin.   Drainage (amount, consistency, odor) none Periwound: macerated, red, blanchable skin Dressing procedure/placement/frequency: Cleanse perineum with incontinence spray.  Gentle wipe and pat dry after each bowel/bladder incontinence.  Apply clear moisture barrier cream daily.  Offload and turn every 2 hours.    Moisture associate contact dermatitis to buttocks and groin. Careful cleaning and use of moisture barrier needed daily.  Today applied pink foam to open area but probably will need to be removed with next bowel movement.  Discussed POC with patient and bedside nurse.  Re consult if needed, will not follow at this time. Thank you, Dorna Bloom BSN, RN, [facility]

## 2016-02-25 NOTE — Progress Notes (Signed)
CRITICAL VALUE ALERT  Critical value received: 6.37 INR  Date of notification: 02/25/2016   Time of notification: 0634  Critical value read back:Yes.    Nurse who received alert:  Benay Pike  MD notified (1st page):  On call Internal Medicine   Time of first page:  [phone]  MD notified (2nd page):  Time of second page:  Responding MD: Dr.  Lizabeth Leyden   Time MD responded:  [phone]

## 2016-02-25 NOTE — Consult Note (Signed)
   [facility] CM Inpatient Consult   02/25/2016  [name] 1934-08-15 [id number]   Patient screened for potential [facility] Management services this is the patient's 3rd admission.  Patient is eligible for [facility] Care Management services under patient's [facility] plan.   Chart review notes reveals the patient is a 81 yo F with pmhx significant for HTN, DM II, HFrEF (20-25%), chronic atrial fibrillation, CVA, and CKD stage V who presents with worsening SOB and decreased PO intake. Patient was recently admitted for E coli bacteremia due to UTI and discharged on 02/19/16. During her prior hospitalization, she was hypotensive due to sepsis at her home lasix and metoprolol were held. She remained euvolemic and her lasix was restarted at a lower dose (20 mg BID) at discharge with plans for close outpatient follow up. Her warfarin was also discontinued due to lability of her INRs. Her bleeding risk was deemed to out weight the benefit of anticoagulation and she was discharged on ASA 81 daily.   Came by to see patient and her daughter [name] was on her way to work.  She states she usually visits the patient in the morning. Daughter ask for follow up in the morning to speak about services available.  For questions contact:   Natividad Brood, RN BSN [facility] Liaison  [phone] business mobile phone Toll free office [phone]

## 2016-02-25 NOTE — Progress Notes (Signed)
Subjective: Patient continues to complain of urinary and fecal incontinence. She feel like her breathing is unchanged from yesterday. Stated this morning, "I feel like I am dying". Declined palliative care consult today, says she has already spoken with them and it is up to her daughter. Patient continues to defer medical decisions to her daughter.   Objective:  Vital signs in last 24 hours: Vitals:   02/25/16 0616 02/25/16 0705 02/25/16 0709 02/25/16 0722  BP: 111/64  98/82   Pulse: (!) 26 90 (!) 103 94  Resp: 18 20 20    Temp: 98 F (36.7 C)     TempSrc:      SpO2: 93% 100% (!) 84% 100%  Weight: 180 lb 8.9 oz (81.9 kg)     Height:       Physical Exam: Blood pressure 124/76, pulse 102, temperature 97.5 F (36.4 C), temperature source Oral, resp. rate 24, SpO2 (!) 88 %. Constitutional: NAD, appears comfortable HEENT: Atraumatic, normocephalic. PERRL, anicteric sclera. Thrush improved today. Cardiovascular: Tachycardic to 101 and irregular, no murmurs, rubs, or gallops.  Pulmonary/Chest: Decreased breath sounds bilateral lower lobes (L>R) , no wheezes, rales, or rhonchi. No chest wall abnormalities.  Abdominal: Soft, non tender, non distended. +BS. Extremities: Warm and well perfused. Distal pulses intact. No edema. Bilateral toe amputations  Neurological: A&Ox3, CN II - XII grossly intact.   Assessment/Plan:  Hypervolemia: In the setting of CHF (EF 20-25%) and CKD stage 5. Patient presents with worsening dyspnea and found to have pulmonary edema on CXR. Intermittently hypoxic to the low 80s and BNP elevated to 986 from 238 one month ago. No lower extremity edema. Lasix recently decreased during prior hospitalization due to hypotension and sepsis.  -- S/p once dose 20 mg IV lasix yesterday, will repeat again today  -- Supplemental oxygen prn for oxygen < 94% -- Daily weights -- Fluid restrict  Oropharyngeal Candidiasis: Likely contributing to patient's poor PO intake over the  past week.  -- Fluconazole 200 mg yesterday, 100 mg daily starting today (Day 2/7)  HCAP: Left lobe consolidation in the setting of recent hospitalization, however patient is afebrile without leukocytosis. Decreased breath sounds bilaterally on exam (L>R). She endorses a non productive cough.  -- S/p IV cefepime x 1 in ED -- No further antibiotics for now; will reassess tomorrow after further diuresis  -- Repeat CXR in AM   Supratherapeutic INR: Previously on warfarin for stroke prophylaxis in the setting of chronic a-fib. Warfarin discontinued during last hospitalization due to lability of INRs. Has not received warfarin in over a week and INR continues to be inappropriately high.  -- S/p 5 mg PO vit K in ED yesterday -- Will check hepatic function panel  -- Daily INR, continue to monitor  -- Hold home ASA 81  Fecal Incontinence: S/p kayexalate in ED. However daughter says this has been going on since September. Patient recently completed a 10 day course of IV ceftriaxone. C-diff antigen positive but toxin negative, will follow up with PCR results.  -- Will start empiric treatment with vancomycin solution 125 mg Q6hrs for a 14 day course unless PCR returns negative  -- Continue to monitor   CKD Stage V: Not a dialysis candidate. Labs at baseline compared to last admission and patient continues to have good UOP. Daughter endorses urinary incontinence but denies dysuria or foul smelling urine. Will check UA given recent E coli bacteremia due to UTI. Worsening renal function over the past few months could be contributing  to patient's nausea and poor appetite. Will continue to monitor.  -- Strict I/Os and daily weights -- Fluid restrict  -- Continue home calcimimetic and PO bicarb  -- Avoid nephrotoxic meds -- UA pending   Chronic Combined CHF: LV EF of 20-25% during last hospitalization with severe biventricular dysfunction. Baseline weight on discharge was 178 lbs, 180 lbs today.  --  Strict I/Os and daily weights -- Fluid restrict  -- No ACEI/ARB due to CKD -- Continue metoprolol 12.5 BID  Atrial Fibrillation: Chronic, rates currently well controlled.  -- Cardiac monitoring  -- Continuemetoprolol 12.5 mg BID, titrate as BP tolerates (previously on 75mg  XL daily at home) -- Continue Amiodarone 200 mg BID  Hypothyroidism: TSH elevated 39 and free T4 normal at recent office visit. Likely due to acute illness.  -- Continue home synthroid 112 mcg daily   Hyperkalemia: Potassium remains elevated today despite kayexalate yesterday in ED.   -- 10 units of Novolog + 50 mL D50 -- f/u BMET this afternoon   HLD: -- Continue home Lipitor 40 mg  Hemorrhoids:  -- Hydrocortisone rectal cream BID  GERD: -- Continue Protonix 40 mg daily   FEN: Fluid restrict, s/p kayexalate in ED, thin diet VTE ppx: SCDs Code Status: DNR   Dispo: Anticipated discharge in approximately 2-3 day(s).   Velna Ochs, MD 02/25/2016, 8:19 AM Pager: [phone]

## 2016-02-25 NOTE — Progress Notes (Signed)
Internal Medicine Clinic Attending  I saw and evaluated the patient.  I personally confirmed the key portions of the history and exam documented by Dr. Svalina and I reviewed pertinent patient test results.  The assessment, diagnosis, and plan were formulated together and I agree with the documentation in the resident's note.  

## 2016-02-26 ENCOUNTER — Other Ambulatory Visit: Payer: Self-pay

## 2016-02-26 ENCOUNTER — Inpatient Hospital Stay (HOSPITAL_COMMUNITY): Payer: Medicare Other

## 2016-02-26 LAB — CBC
HCT: 34.7 % — ABNORMAL LOW (ref 36.0–46.0)
Hemoglobin: 9.9 g/dL — ABNORMAL LOW (ref 12.0–15.0)
MCH: 25.1 pg — AB (ref 26.0–34.0)
MCHC: 28.5 g/dL — AB (ref 30.0–36.0)
MCV: 88.1 fL (ref 78.0–100.0)
PLATELETS: 64 10*3/uL — AB (ref 150–400)
RBC: 3.94 MIL/uL (ref 3.87–5.11)
RDW: 25.2 % — AB (ref 11.5–15.5)
WBC: 7 10*3/uL (ref 4.0–10.5)

## 2016-02-26 LAB — URINALYSIS, ROUTINE W REFLEX MICROSCOPIC
Bilirubin Urine: NEGATIVE
GLUCOSE, UA: NEGATIVE mg/dL
Ketones, ur: NEGATIVE mg/dL
Nitrite: NEGATIVE
Protein, ur: 100 mg/dL — AB
Specific Gravity, Urine: 1.016 (ref 1.005–1.030)
pH: 7.5 (ref 5.0–8.0)

## 2016-02-26 LAB — URINE MICROSCOPIC-ADD ON

## 2016-02-26 LAB — COMPREHENSIVE METABOLIC PANEL
ALBUMIN: 2.5 g/dL — AB (ref 3.5–5.0)
ALT: 41 U/L (ref 14–54)
AST: 157 U/L — ABNORMAL HIGH (ref 15–41)
Alkaline Phosphatase: 170 U/L — ABNORMAL HIGH (ref 38–126)
Anion gap: 18 — ABNORMAL HIGH (ref 5–15)
BILIRUBIN TOTAL: 1.3 mg/dL — AB (ref 0.3–1.2)
BUN: 74 mg/dL — AB (ref 6–20)
CALCIUM: 7.5 mg/dL — AB (ref 8.9–10.3)
CHLORIDE: 99 mmol/L — AB (ref 101–111)
CO2: 28 mmol/L (ref 22–32)
CREATININE: 7.59 mg/dL — AB (ref 0.44–1.00)
GFR calc Af Amer: 5 mL/min — ABNORMAL LOW (ref >60)
GFR, EST NON AFRICAN AMERICAN: 5 mL/min — AB (ref >60)
Glucose, Bld: 128 mg/dL — ABNORMAL HIGH (ref 65–99)
Potassium: 5.1 mmol/L (ref 3.5–5.1)
SODIUM: 145 mmol/L (ref 135–145)
TOTAL PROTEIN: 6.7 g/dL (ref 6.5–8.1)

## 2016-02-26 LAB — GLUCOSE, CAPILLARY
GLUCOSE-CAPILLARY: 123 mg/dL — AB (ref 65–99)
Glucose-Capillary: 120 mg/dL — ABNORMAL HIGH (ref 65–99)
Glucose-Capillary: 155 mg/dL — ABNORMAL HIGH (ref 65–99)
Glucose-Capillary: 167 mg/dL — ABNORMAL HIGH (ref 65–99)

## 2016-02-26 LAB — PROTIME-INR
INR: 6.37 — AB
INR: 5.9 — AB
PROTHROMBIN TIME: 54.6 s — AB (ref 11.4–15.2)
Prothrombin Time: 58.1 seconds — ABNORMAL HIGH (ref 11.4–15.2)

## 2016-02-26 MED ORDER — LOPERAMIDE HCL 1 MG/5ML PO LIQD
2.0000 mg | ORAL | Status: DC | PRN
  Filled 2016-02-26: qty 10

## 2016-02-26 MED ORDER — FENTANYL CITRATE (PF) 100 MCG/2ML IJ SOLN
25.0000 ug | INTRAMUSCULAR | Status: DC | PRN
  Administered 2016-02-26 – 2016-02-27 (×5): 25 ug via INTRAVENOUS
  Filled 2016-02-26 (×5): qty 2

## 2016-02-26 NOTE — Progress Notes (Addendum)
   02/26/16 1345  Vitals  BP (!) 82/45  MAP (mmHg) (!) 55  BP Location Right Arm  BP Method Automatic  Pulse Rate 100  Pulse Rate Source Monitor   BP reported to IM resident, Guilloud. No new orders at this time. Will continue to monitor pt. Also made resident aware that pt isn't eating at all (refusing).

## 2016-02-26 NOTE — Progress Notes (Signed)
On room air, oxygen saturation 82%. Placed on 1L oxygen, saturation went up to 99%. Will keep pt on 1L O2 for now. Will continue to monitor pt.

## 2016-02-26 NOTE — Progress Notes (Signed)
CRITICAL VALUE ALERT  Critical value received: INR 5.90  Date of notification: 02/26/16  Time of notification:  0725  Critical value read back:Yes.    Nurse who received alert:  S.Isiah Scheel  MD notified (1st page):  Guilloud  Time of first page:  0726  MD notified (2nd page):Guilloud  Time of second page: 0740  Responding MD:    Time MD responded:

## 2016-02-26 NOTE — Progress Notes (Signed)
Palliative Medicine consult noted. Due to high referral volume, there may be a delay seeing this patient. Please call the Palliative Medicine Team office at [phone] if recommendations are needed in the interim.  Thank you for inviting [place] to see this patient.  Marjie Skiff Valeda Corzine, RN, BSN, [facility] 02/26/2016 1:29 PM Cell [phone] 8:00-4:00 Monday-Friday Office [phone]

## 2016-02-26 NOTE — Progress Notes (Addendum)
  Date: 02/26/2016  Patient name: [name]  Medical record number: [id number]  Date of birth: 1934/10/02   I have personally seen the patient and the plan of care was discussed with the house staff. Please see Dr. Rivka Safer note for complete details. I concur with her findings.   We had a discussion with patient and daughter [name] on rounds this morning.  Family friend Clinton Quant was also present by phone.  We discussed options and goals of therapy.  [name] and her mother expressed the desire to having [name] be more comfortable.  I discussed with [name] what hospice is and how her care would be going forward and she was interested in her mother getting care to make her more comfortable and to increase her quality of life currently.  [name] is DNR and is open to hospice.  Will have palliative care come and speak with [name].  I think she should be transitioned to hospice.  Her prognosis with HFrEF with EF of around 20%, worsening renal failure (last seen by Nephrology in August and not thought to be an HD candidate), pleural effusions and malnutrition is poor.    I personally reviewed Heart Failure team notes from September of this month and at that time it was felt palliative care would be best option for patient.  All of this information will be communicated with patient and family.   [name], MD 02/26/2016, 12:39 PM

## 2016-02-26 NOTE — Progress Notes (Signed)
Subjective: Patient continues to complain of difficulty breathing and said again this morning, "I feel like I am dying" and "I feel like my heart is going to stop". Patient denies chest pain, or pain anywhere in her body. She continues to experience urinary incontinence.   Objective:  Vital signs in last 24 hours: Vitals:   02/25/16 1533 02/25/16 1724 02/25/16 2150 02/26/16 0554  BP: (!) 86/52 (!) 100/55 98/63 119/65  Pulse: 73 89 90 86  Resp: 19 19 17 18   Temp:   97.6 F (36.4 C) 97.4 F (36.3 C)  TempSrc:   Oral Oral  SpO2: 100% 100% 100% 96%  Weight:    173 lb 1 oz (78.5 kg)  Height:       Physical Exam:. Constitutional: NAD HEENT: Atraumatic, normocephalic. PERRL, anicteric sclera. Thrush improved today. Cardiovascular:Tachycardic to 101 and irregular, no murmurs, rubs, or gallops.  Pulmonary/Chest:Decreased breath sounds bilateral lower lobes (L>R), breathing comfortably on 2L [place].  Abdominal:Soft, non tender, non distended. +BS. Extremities: Warm and well perfused. Distal pulses intact. No edema. Bilateral toe amputations  Neurological:A&Ox3, CN II - XII grossly intact.   Assessment/Plan:  Goals of Care: Discussion initiated yesterday by PCP Dr. Daryll Drown. Patient is here with end stage heart failure, CKD stage V (not a dialysis candidate), volume overload and hypoxia not responding well to lasix. This is her third hospitalization since August. She has received two doses of IV lasix thus far with worsening of her renal function and labile blood pressures, down to 80s/50s yesterday after diuresis. Patient has stated multiple times that she feels like she is dying. Talked today with patient and daughter regarding further treatment. Options include a more aggressive approach with pericardiocentesis or possibly milrinone, however it is unlikely the patient would be a good candidate for either of these options, vs. Comfort care and hospice. Our team recommends a palliative approach  at this point and patient and daughter seem to agree. Family is very tearful but reactions are appropriate given her prognosis.  -- Palliative care consult today, appreciate recommendations for air hunger and appetite  -- Patient was recently set up with outpatient palliative care, will likely need to be transitioned to hospice   Hypervolemia:In the setting of CHF (EF 20-25%) and CKD stage 5. Patient presents with worsening dyspnea. CXR with pulmonary edema, pericardial effusion, and bilateral pleural effusions. Intermittently hypoxic to the low 80s responding well to supplemental oxygen. BNP elevated to 986 from 238 one month ago. Lasix recently decreased during prior hospitalization due to hypotension and sepsis. She has now received two doses of IV lasix this admission with poor response. Blood pressures dropped to 80s/50s yesterday and creatinine has increased from 6.8 -> 7.6 since admission. She continues to be hypoxic without oxygen and complaining of SOB. Stated again today that she feels like she is dying.  -- S/p 20 mg IV lasix x 2 (once on admission and once yesterday).  -- I/Os unable to be recorded due to incontinence  -- Supplemental oxygen prn for oxygen < 92% -- Daily weights -- Fluid restrict -- Palliative consult as above  Oropharyngeal Candidiasis: Likely contributing to patient's poor PO intake over the past week.  -- Fluconazole 200 mg on admission followed by 100 mg daily (Day 3/7)  HCAP: Left lobe consolidation in the setting of recent hospitalization, however patient is afebrile without leukocytosis. Decreased breath sounds bilaterally on exam (L>R). She endorses a non productive cough.  -- S/p IV cefepime x 1 in ED --  No further antibiotics for now; will repeat CXR today   Supratherapeutic INR: Previously on warfarin for stroke prophylaxis in the setting of chronic a-fib. Warfarin discontinued during last hospitalization due to lability of INRs. Has not received  warfarin in over a week and INR continues to be inappropriately high. Hepatic function panel with elevated AST 157, normal ALT 41, elevated Alk Phos 170, and T bili 1.3. Likely due to hepatic congestion.  -- S/p 5 mg PO vit K in ED on admission; no further vit K for now  -- Daily INR, continue to monitor  -- Hold home ASA 81  Fecal Incontinence: S/p kayexalate in ED. However daughter says this has been going on since September. Patient recently completed a 10 day course of IV ceftriaxone. C-diff antigen positive but toxin negative. Reflex PCR negative. No indication for treatment at this time. -- Vancomycin discontinued.  -- Consider treatment with anti-diarrheal if this problem persists    CKD Stage V: Not a dialysis candidate. Patient continues to have good UOP. Daughter endorses urinary incontinence but denies dysuria or foul smelling urine. Will check UA given recent E coli bacteremia due to UTI. Worsening renal function over the past few months could be contributing to patient's nausea and poor appetite. Will continue to monitor.  -- Strict I/Os and daily weights -- Fluid restrict  -- Continue home calcimimetic and PO bicarb  -- Avoid nephrotoxic meds -- UA pending   Chronic Combined CHF: LV EF of 20-25% during last hospitalization with severe biventricular dysfunction. -- Daily weights, no I/Os due to incontinence  -- Fluid restrict  -- No ACEI/ARB due to CKD -- Continue metoprolol 12.5 BID  Atrial Fibrillation: Chronic, rates currently well controlled.  -- Cardiac monitoring  -- Continuemetoprolol 12.5 mg BID -- Continue Amiodarone 200 mg BID  Hypothyroidism: TSH elevated 39 and free T4 normal at recent office visit. Likely due to acute illness.  -- Continue home synthroid 112 mcg daily   Hyperkalemia: Improved today.   -- S/p Kayexalate in the ED and 10 units of Novolog + 50 mL D50 yesterday  -- f/u AM labs   HLD: -- Continue home Lipitor 40 mg  Hemorrhoids:  --  Hydrocortisone rectal cream BID  GERD: -- Continue Protonix 40 mg daily   FEN: Fluid restrict, s/p kayexalate, insulin, and D50 for hyperkalemia, renal diet  VTE ppx: SCDs Code Status: DNR   Dispo: Anticipated discharge in approximately 1-2 day(s).   Velna Ochs, MD 02/26/2016, 8:18 AM Pager: [phone]

## 2016-02-26 NOTE — Consult Note (Signed)
   [facility] CM Inpatient Consult   02/26/2016  [name] 01/12/1935 [id number]   Met with the patient and daughter [name] regarding post hospital follow up needs.  Patient states, "you can just talk to my daughter."  Patient states she feels a little better, no eye contact noted.   Explained that [facility] Care Management does not interfere with any services arranged by the inpatient care management department. [name] states that she just wants her mother to be comfortable and is not sure her heart and kidneys are strong enough for her to have much more in medical care to go on.  "I just want her to be comfortable and get her the help she needs."  She did verify that [facility] comes 7 days a week, Monday - Saturday for 6 hours and 4 hours on Sunday."  Daughter works in the evenings. Patient and family may benefit from a Palliative Care consult, as well.  Will update inpatient RNCM of [facility] following.  Consent form signed, and will follow up on progress and disposition before assigning to [facility] community.  Daughter verbalized understanding and folder with information given.  For questions, please contact:  Victoria Brewer, RN BSN CCM [facility] Liaison  [phone] business mobile phone Toll free office [phone]    

## 2016-02-27 DIAGNOSIS — N179 Acute kidney failure, unspecified: Secondary | ICD-10-CM

## 2016-02-27 DIAGNOSIS — I959 Hypotension, unspecified: Secondary | ICD-10-CM

## 2016-02-27 LAB — CBC
HEMATOCRIT: 35.1 % — AB (ref 36.0–46.0)
HEMOGLOBIN: 10.2 g/dL — AB (ref 12.0–15.0)
MCH: 24.9 pg — ABNORMAL LOW (ref 26.0–34.0)
MCHC: 29.1 g/dL — ABNORMAL LOW (ref 30.0–36.0)
MCV: 85.6 fL (ref 78.0–100.0)
Platelets: 68 10*3/uL — ABNORMAL LOW (ref 150–400)
RBC: 4.1 MIL/uL (ref 3.87–5.11)
RDW: 25.8 % — ABNORMAL HIGH (ref 11.5–15.5)
WBC: 6.6 10*3/uL (ref 4.0–10.5)

## 2016-02-27 LAB — BASIC METABOLIC PANEL
ANION GAP: 17 — AB (ref 5–15)
BUN: 82 mg/dL — AB (ref 6–20)
CHLORIDE: 99 mmol/L — AB (ref 101–111)
CO2: 30 mmol/L (ref 22–32)
Calcium: 7.6 mg/dL — ABNORMAL LOW (ref 8.9–10.3)
Creatinine, Ser: 7.85 mg/dL — ABNORMAL HIGH (ref 0.44–1.00)
GFR calc Af Amer: 5 mL/min — ABNORMAL LOW (ref >60)
GFR calc non Af Amer: 4 mL/min — ABNORMAL LOW (ref >60)
Glucose, Bld: 157 mg/dL — ABNORMAL HIGH (ref 65–99)
POTASSIUM: 6.2 mmol/L — AB (ref 3.5–5.1)
SODIUM: 146 mmol/L — AB (ref 135–145)

## 2016-02-27 LAB — GLUCOSE, CAPILLARY: Glucose-Capillary: 151 mg/dL — ABNORMAL HIGH (ref 65–99)

## 2016-02-27 LAB — PROTIME-INR
INR: 4.16
Prothrombin Time: 41.3 seconds — ABNORMAL HIGH (ref 11.4–15.2)

## 2016-02-27 MED ORDER — DEXTROSE 50 % IV SOLN
50.0000 mL | Freq: Once | INTRAVENOUS | Status: AC
Start: 2016-02-27 — End: 2016-02-27
  Administered 2016-02-27: 50 mL via INTRAVENOUS
  Filled 2016-02-27: qty 50

## 2016-02-27 MED ORDER — FENTANYL CITRATE (PF) 100 MCG/2ML IJ SOLN
25.0000 ug | INTRAMUSCULAR | Status: DC | PRN
Start: 2016-02-27 — End: 2016-02-28
  Administered 2016-02-27 – 2016-02-28 (×4): 25 ug via INTRAVENOUS
  Filled 2016-02-27 (×4): qty 2

## 2016-02-27 MED ORDER — INSULIN ASPART 100 UNIT/ML ~~LOC~~ SOLN
10.0000 [IU] | Freq: Once | SUBCUTANEOUS | Status: AC
  Administered 2016-02-27: 10 [IU] via SUBCUTANEOUS

## 2016-02-27 MED ORDER — LORAZEPAM 2 MG/ML IJ SOLN
0.5000 mg | INTRAMUSCULAR | Status: DC | PRN
  Administered 2016-02-28: 0.5 mg via INTRAVENOUS
  Filled 2016-02-27: qty 1

## 2016-02-27 NOTE — Progress Notes (Signed)
CRITICAL VALUE ALERT  Critical value received: INR 4.16  Date of notification:  02/27/16  Time of notification:  0701  Critical value read back:Yes.    Nurse who received alert:  Blenda Mounts, RN  MD notified (1st page):  Guilloud  Time of first page:  0705  MD notified (2nd page):  Time of second page:  Responding MD:  Dr. Philipp Ovens  Time MD responded:  [phone]

## 2016-02-27 NOTE — Progress Notes (Signed)
Subjective: Patient continues to complain of SOB. She denies pain. She was started on IV fentanyl for air hunger yesterday and is unsure if it is helping. She had received 4 doses since yesterday afternoon. She continues to complain of fecal and urinary incontinence. Patient said this morning she feels like she is becoming confused, but was alert and oriented x 3.   Objective:  Vital signs in last 24 hours: Vitals:   02/26/16 1345 02/26/16 1530 02/26/16 2103 02/27/16 0519  BP: (!) 82/45 119/62 120/71 115/67  Pulse: 100 92 (!) 104 95  Resp: _0 Temp: 98 F (36.7 C)  97.8 F (36.6 C) 97.5 F (36.4 C)  TempSrc: Oral  Oral Oral  SpO2: 100%  98% 100%  Weight:      Height:       Physical Exam:. Constitutional: NAD HEENT: Atraumatic, normocephalic. PERRL, anicteric sclera. Thrush improved. Cardiovascular:irregular rhythm, regular rate, no murmurs, rubs, or gallops.  Pulmonary/Chest:Decreased breath sounds bilateral lower lobes (L>R), Some increased work of breathing noted today with use of accessory muscles.  Abdominal:Soft, non tender, non distended. +BS. Extremities: Warm and well perfused. Distal pulses intact. No edema. Bilateral toe amputations  Neurological:A&Ox3, CN II - XII grossly intact.    Assessment/Plan:  Goals of Care: Patient is here with end stage heart failure, CKD stage V (not a dialysis candidate), volume overload and hypoxia not responding well to lasix. This is her third hospitalization since August. She has received two doses of IV lasix thus far with worsening of her renal function and labile blood pressures, down to 80s/50s after diuresis. Patient has stated multiple times that she feels like she is dying. Family has decided to pursue comfort care at this time as she is not a candidate for more aggressive therapies like pericardiocentesis or milrinone. Patient was started on IV fentanyl 25 mcg q2 hours yesterday for air hunger. Will consider increasing  dose and frequency per palliative recommendations.  -- Palliative care consult today, appreciate recommendations   -- Patient will be transferred to [facility] per palliative -- Will stop all lab draws, pulse ox, and cardiac monitoring. Continue supplemental oxygen for comfort.   Hypervolemia:In the setting of CHF (EF 20-25%) and CKD stage 5. CXR with pulmonary edema, pericardial effusion, and bilateral pleural effusions. Hypoxic to the low 80s. Responded poorly to IV lasix with worsening of her renal function.  -- Comfort care; no further diuresis at this time -- Continue supplemental oxygen for comfort -- Palliative following, appreciate recs   Oropharyngeal Candidiasis: Likely contributing to patient's poor PO intake over the past week.  -- Fluconazole 200 mg on admission followed by 100 mg daily (Day 4/7)  HCAP: Left lobe consolidation in the setting of recent hospitalization, however patient is afebrile without leukocytosis. Decreased breath sounds bilaterally on exam (L>R) due to pleural effusions. She endorses a non productive cough.  -- S/p IV cefepime x 1 in ED -- No further antibiotics   Supratherapeutic INR: Previously on warfarin for stroke prophylaxis in the setting of chronic a-fib. Warfarin discontinued during last hospitalization due to lability of INRs. Has not received warfarin in over a week and INR continues to be inappropriately high. Hepatic function panel with elevated AST 157, normal ALT 41, elevated Alk Phos 170, and T bili 1.3. Likely due to hepatic congestion.  -- Values have been unchanged despite Vit K; will discontinue INR checks  -- Hold home ASA 81  Fecal Incontinence: S/p kayexalate in ED.  However daughter says this has been going on since September. Patient recently completed a 10 day course of IV ceftriaxone. C-diff antigen positive but toxin negative. Reflex PCR negative. No indication for treatment at this time. -- Imodium prn   CKD Stage V: Not a  dialysis candidate. UA with small leukocytes, negative ntirites, rare bacteria, and granular casts. Patient has been oliguric since 7 pm yesterday evening. Bladder scan today with 80 cc this morning.   -- Continue home calcimimetic and PO bicarb  -- Avoid nephrotoxic meds  Chronic Combined CHF: LV EF of 20-25% during last hospitalization with severe biventricular dysfunction. -- No ACEI/ARB due to CKD -- Continue metoprolol 12.5 BID  Atrial Fibrillation: Chronic, rates currently well controlled.  -- Cardiac monitoring discontinued  -- Continuemetoprolol 12.5 mg BID -- Continue Amiodarone 200 mg BID  Hypothyroidism: TSH elevated 39 and free T4 normal at recent office visit. Likely due to acute illness.  -- Continue home synthroid 112 mcg daily   Hyperkalemia: Elevated again today 6.2, likely due to progression of her CKD.  -- Repeat Novolog 10 + 50 mL D50 today -- We will subsequently stopped all lab draws   HLD: -- Continue home Lipitor 40 mg  Hemorrhoids:  -- Hydrocortisone rectal cream BID  GERD: -- Continue Protonix 40 mg daily   FEN: Comfort care VTE ppx: SCDs Code Status: DNR   Dispo: Anticipated discharge pending hospice placement.   Stacy Ochs, MD 02/27/2016, 6:45 AM Pager: [phone]

## 2016-02-27 NOTE — Clinical Social Work Note (Signed)
Clinical Social Work Assessment  Patient Details  Name: [name] MRN: [id number] Date of Birth: [birth year]  Date of referral:  02/27/16               Reason for consult:  Facility Placement    Hospice placement            Permission sought to share information with:  Facility Sport and exercise psychologist, Family Supports Permission granted to share information::  Yes, Verbal Permission Granted  Name::     Music therapist::  Residential hospice  Relationship::  dtr  Contact Information:     Housing/Transportation Living arrangements for the past 2 months:  Single Family Home Source of Information:  Adult Children Patient Interpreter Needed:  None Criminal Activity/Legal Involvement Pertinent to Current Situation/Hospitalization:  No - Comment as needed Significant Relationships:  Adult Children Lives with:  alone Do you feel safe going back to the place where you live?  No Need for family participation in patient care:  Yes (Comment)  Care giving concerns:  Pt does not have sufficient support for her current level of needs to return home especially with expected decline in functioning over coming days.   Social Worker assessment / plan:  CSW spoke with pt dtr concerning recommendation for hospice- dtr already discussed details with palliative nurse and has no clarifying questions.   Employment status:  Retired Nurse, adult PT Recommendations:  Not assessed Information / Referral to community resources:  [place]  Patient/Family's Response to care:Agreeable to hospice stay and hopeful for [facility] or [facility]  Patient/Family's Understanding of and Emotional Response to Diagnosis, Current Treatment, and Prognosis:  Dtr has no questions or concerns- hopeful pt will be able to be placed close to home to allow for more family and friends to spend time with her prior to her passing.  Emotional Assessment Appearance:  Appears stated  age Attitude/Demeanor/Rapport:    Affect (typically observed):  Appropriate, Quiet Orientation:  Oriented to Self, Oriented to Place, Oriented to  Time, Oriented to Situation Alcohol / Substance use:  Not Applicable Psych involvement (Current and /or in the community):  No (Comment)  Discharge Needs  Concerns to be addressed:  Care Coordination Readmission within the last 30 days:  Yes Current discharge risk:  Terminally ill Barriers to Discharge:  Hospice Bed not available   Jorge Ny, LCSW 02/27/2016, 3:04 PM

## 2016-02-27 NOTE — Progress Notes (Signed)
   02/27/16 1347  Vitals  BP (!) 75/48  MAP (mmHg) (!) 55  BP Location Right Arm  BP Method Automatic  Patient Position (if appropriate) Lying  Pulse Rate 94  Pulse Rate Source Monitor   BP reported to IM resident, [place]. She said we will watch BP for now. No new orders at this time. Pt is sitting in bed, conversing with family. Will continue to monitor.

## 2016-02-27 NOTE — Progress Notes (Signed)
Nutrition Brief Note  Chart reviewed. Pt now transitioning to comfort care.  No further nutrition interventions warranted at this time.  Please re-consult as needed.   Casimiro Lienhard A. Artavia Jeanlouis, RD, LDN, CDE Pager: [phone] After hours Pager: [phone]  

## 2016-02-27 NOTE — Progress Notes (Signed)
Palliative Medicine RN Note: Met with daughter [name] in person and son Bronson Ing via phone. When I saw pt this am with the primary team, the patient verbalized that she thinks she's going crazy; she is at very high risk for EOL delirium. Discussed the information they have gotten from the physicians and the pt's shortness of breath, as well as pt's feeling that she is dying. Pt has heart and kidney disease with abnormal labs.Discussed options for d/c: home with no support, home w [place] (had this previously), home with hospice or inpatient hospice. [name] and [place] both agree that inpt hospice is best fit for them. Goal is comfort (relief of pain, SOB).   Family is very concerned that hospice will just give her morphine and shove her in a corner. [name] states that they know God is in charge of when she will die; described to [name] that we can plan for what medicine can do, and that we need to make plans in case the miracle doesn't come. Discussed hospice's use of enough meds and that they will they to make a plan to go home if that is ultimate goal, understanding that pt may be too ill for that. They want to be sure that hospice will continue to encourage eating (explained that there will be no force feeding) and will continue any meds that help her feel better by improving cardiac function. Spoke w Harmon Pier with [facility]; she will come meet with pt's daughter and discuss exactly what the family can expect.  [place] DNR on chart; MD will come sign. Discussed w Dr Philipp Ovens comfort recommendations and entered orders per our discussion. Plan f/u this afternoon.  Marjie Skiff Ksean Vale, RN, BSN, [facility] 02/27/2016 11:45 AM Cell [phone] 8:00-4:00 Monday-Friday Office [phone]

## 2016-02-27 NOTE — Progress Notes (Signed)
  Date: 02/27/2016  Patient name: [name]  Medical record number: [id number]  Date of birth: [birth year]   I have seen the patient personally and the plan of care was discussed with the house staff. Please see Dr. Rivka Safer note for complete details. I concur with her findings.   Patient will be transitioning to comfort care.  PC team involved and [place] will come and discuss inpatient hospice with family in the morning.   Given her worsening renal function, anuric state and now hypotension, I would not be surprised if she had an in hospital death in the next few days.    [name], MD 02/27/2016, 11:32 AM

## 2016-02-27 NOTE — Progress Notes (Signed)
CSW following for residential hospice placement.  1st choice is [facility]- referral made- no availability today but will follow up 10/12 for possible admission 2nd choice- [facility] made referral to hospital liaison who will follow up 10/12 if [facility] has no availability  Jorge Ny, [place] Social Worker [phone]

## 2016-02-27 NOTE — Consult Note (Signed)
[facility] Liaison: Received request from [place] for family interest in [facility]. Appreciate  report from [facility]. Chart reviewed and met with patient and daughter [name] at bedside to explain services and confirm interest. [name] and her brother plan to visit [employer] this afternoon. Plan is to follow up in am re eligibility and availability.   Thank you,  Erling Conte, LCSW [phone]

## 2016-02-27 NOTE — Consult Note (Signed)
   [facility] CM Inpatient Consult   02/27/2016  [name] 09-18-1934 [id number]   Update:  Chart review and reveals the patient is now transitioning to Palliative Care and possibly [facility].  [facility] Care Management will not pursue to for community care management.  Thanks for the referral.  [place] Management will sign off.  Natividad Brood, RN BSN [facility] Liaison  [phone] business mobile phone Toll free office [phone]

## 2016-02-27 NOTE — Discharge Summary (Signed)
Name: [name] MRN: [id number] DOB: 09/17/1934 81 y.o. PCP: [name], MD  Date of Admission: 02/24/2016  5:54 AM Date of Discharge: 02/28/2016 Attending Physician: [name], MD  Discharge Diagnosis: 1. End Stage HF  2. Acute on Chronic Renal Failure  3. Comfort care   Discharge Medications:   Medication List    STOP taking these medications   acetaminophen 325 MG tablet Commonly known as:  TYLENOL   amiodarone 200 MG tablet Commonly known as:  PACERONE   aspirin 81 MG EC tablet   atorvastatin 40 MG tablet Commonly known as:  LIPITOR   calcitRIOL 0.25 MCG capsule Commonly known as:  ROCALTROL   cetirizine 10 MG chewable tablet Commonly known as:  ZYRTEC   furosemide 20 MG tablet Commonly known as:  LASIX   guaiFENesin 600 MG 12 hr tablet Commonly known as:  MUCINEX   guaiFENesin-dextromethorphan 100-10 MG/5ML syrup Commonly known as:  ROBITUSSIN DM   hydrocortisone 2.5 % rectal cream Commonly known as:  ANUSOL-HC   levothyroxine 112 MCG tablet Commonly known as:  SYNTHROID, LEVOTHROID   lidocaine 4 % cream Commonly known as:  LMX   metoprolol tartrate 25 MG tablet Commonly known as:  LOPRESSOR   ondansetron 4 MG tablet Commonly known as:  ZOFRAN   pantoprazole 40 MG tablet Commonly known as:  PROTONIX   potassium chloride SA 20 MEQ tablet Commonly known as:  K-DUR,KLOR-CON   sennosides-docusate sodium 8.6-50 MG tablet Commonly known as:  SENOKOT-S   sodium bicarbonate 650 MG tablet   witch hazel-glycerin pad Commonly known as:  TUCKS       Disposition and follow-up:   [name] was discharged from [facility] in [place] condition.  At the hospital follow up visit please address:  1.  Please follow up on comfort care measures. She was receiving fentanyl 25 mcg q1-3hr for air hunger and 2L supplemental oxygen for SOB. IV ativan 0.57m prn was ordered for anxiety but patient never required it.  All of  her other medicines have been discontinued. She may continue imodium prn for diarrhea and prn robitussin for her cough if needed.  2.  Labs / imaging needed at time of follow-up: None  3.  Pending labs/ test needing follow-up: None  Follow-up Appointments:   Hospital Course by problem list: Principal Problem:   Dyspnea Active Problems:   Hypothyroidism   Hyperlipidemia   Essential hypertension   Stage 5 chronic kidney disease not on chronic dialysis ([facility]   Atrial fibrillation (HCC)   Elevated international normalized ratio (INR)   Acute on chronic systolic heart failure (HCC)   Decubitus ulcer of sacral region, stage 1   Thrush, oral   1. Hypervolemia: Patient presented with worsening dyspnea in the setting of end stage HF (EF 20-25%) and CKD stage V. CXR on admission revealed pulmonary edema, bilateral pleural effusions, and a pericardial effusion. She was intermittently hypoxic to the low 80s and her BNP was elevated to 986 from 238 one month ago. This is her third hospitalization since August and she was last discharged on 02/19/16. Her lasix was decreased during her last hospitalization in the setting of hypotension and sepsis. She subsequently became increasingly SOB and was readmitted for hypervolemia. She received two doses of IV lasix this admission with poor response. Blood pressures dropped to 80s/50s after gentle diuresis and creatinine increased from 6.8 to 7.6 the following day. She has continued to be hypoxic and SOB.  Repeat CXR showed no change in her pulmonary edema. On hospital day 4 she became oliguric suggestive of acute on chronic renal failure. She has stated repetitively this admission that she feels like she is dying. She was last seen by nephrology in August and deemed not to be a HD candidate. She was also evaluated by the Heart Failure team in September of this year who recommended palliative care at that time. Goals of care discussion was initiated with patient and  daughter, including options for further treatment. Given her poor prognosis she is unlikely to be a good candidate for milrinone or a pericardiocentesis. Palliative care was consulted and family has decided to pursue comfort care and hospice at this time.   2. Decreased PO intake: Patient has been experiencing decreased PO intake for the past few months but acutely worsened since her last hospitalization. Since her last discharge she has been eating only ice chips and drinking water and refusing to eat solid foods. She was found to have thrush on admission, likely secondary to her antibiotic course during her prior hospitalization. She was started on fluconazole daily. Thrush improved, but patient continues to refuse to eat, stating that she is not hungry. This is likely due to her multiple end stage, chronic disease states and poor perfusion to her mesentery. Thrush resolved and fluconazole was discontinued at discharge.   3. HCAP: CXR revealed a possible left lobe consolidation but visualization was difficult in the setting of pulmonary edema. Patient received one dose of IV cefepime in the ED. She remained afebrile without leukocytosis. Antibiotics were discontinued the following day.   4. Supratherapeutic INR: Previously on warfarin for stroke prophylaxis in the setting of chronic a-fib. Warfarin discontinued during last hospitalization due to lability of INRs. She has not received warfarin in over a week and INR continues to be inappropriately high. Hepatic function panel revealed elevated AST 157, normal ALT 41, elevated Alk Phos 170, and T bili 1.3. Likely due to congestive hepatopathy. INR has been unchanged despite multiple doses of Vit K. INR checks and her home ASA were discontinued.  5. Fecal Incontinence: S/p kayexalate in ED. However daughter says this has been going on since September. Patient recently completed a 10 day course of IV ceftriaxone. C-diff antigen was positive but toxin negative.  Reflex PCR negative. No indication for treatment at this time. She was started on Imodium prn.   6. CKD Stage V: Not a dialysis candidate. Patient responded poorly to IV lasix with a rise in creatinine 6.8 -> 7.6. Patient became anuric on hospital day 4, bladder scan with 80 cc urine. Likely acute on chronic renal failure. Home calcimimetic and PO bicarb were discontinued   7. Chronic Combined CHF: LV EF of 20-25% during last hospitalization with severe biventricular dysfunction. No ACEI/ARB due to CKD. Her home metoprolol 12.5 BID was discontinued on discharge.   8. Atrial Fibrillation: Rates remained well controlled on home medicines. Metoprolol 12.5 mg BID and Amiodarone 200 mg BID discontinued on discharge.   9. Hypothyroidism: TSH elevated 39 and free T4 normal at recent office visit. Likely due to acute illness. Home synthroid 112 mcg daily was continued.  10. Hyperkalemia: Intermittently elevated to 6-7 range in the setting of renal failure. No chest pain and no EKG changes. She received kayexalate in the ED and developed severe diarrhea. Further kayexalate was avoided. She was treated with 10 units of novolog + D50 with good response initially, but hyperkalemia returned after two days. She was  given one more dose of novolog + D50 and labs draws were subsequently discontinued to transition to comfort care.   HLD: Home Lipitor 40 mg was discontinued on discharge.  Hemorrhoids: Hydrocortisone rectal cream BID prn.   GERD: Protonix 40 mg daily discontinued on discharge.   Discharge Vitals:   BP 101/72   Pulse 84   Temp 97.5 F (36.4 C) (Oral)   Resp 18   Ht 5' 6"  (1.676 m)   Wt 176 lb 9.4 oz (80.1 kg)   SpO2 99%   BMI 28.50 kg/m   Pertinent Labs, Studies, and Procedures:   02/24/16 CXR 2 View:  IMPRESSION: 1. Enlarged cardiomediastinal silhouette, cardiomegaly alone versus pericardial effusion. 2. Left retrocardiac consolidation, favored to be left lower lobe collapse.  Pneumonia would be difficult to exclude. 3. Interstitial pulmonary edema with small right and medium-sized left pleural effusions. 4. Aortic atherosclerosis.  02/26/16 CXR 2 View: IMPRESSION: 1. Unchanged from 2 days prior. 2. Cardiopericardial enlargement with pericardial effusion seen by CT 02/13/2016. 3. Atelectasis or consolidation at the left base. 4. Possible mild pulmonary edema.  Discharge Instructions: Discharge Instructions    Diet general    Complete by:  As directed       Signed: Velna Ochs, MD 02/28/2016, 10:59 AM   Pager: [phone]

## 2016-02-27 NOTE — Progress Notes (Signed)
Patient did not void during the night.  Bladder scan was 80 ml.  MD notified. No new orders.  Will continue to monitor.

## 2016-02-28 ENCOUNTER — Telehealth: Payer: Self-pay | Admitting: Internal Medicine

## 2016-02-28 LAB — GLUCOSE, CAPILLARY: GLUCOSE-CAPILLARY: 161 mg/dL — AB (ref 65–99)

## 2016-02-28 MED ORDER — PROMETHAZINE HCL 25 MG PO TABS
12.5000 mg | ORAL_TABLET | Freq: Four times a day (QID) | ORAL | Status: DC | PRN
  Filled 2016-02-28: qty 1

## 2016-02-28 NOTE — Care Management Note (Signed)
Case Management Note  Patient Details  Name: [name] MRN: [id number] Date of Birth: 05/04/1935  Subjective/Objective:                    Action/Plan:  DC to [facility] today, confirmed by CSW.   Expected Discharge Date:                  Expected Discharge Plan:  [place]  In-House Referral:  Clinical Social Work  Discharge planning Services  CM Consult  Post Acute Care Choice:  NA Choice offered to:  NA  DME Arranged:  N/A DME Agency:  NA  HH Arranged:  NA HH Agency:  NA  Status of Service:  Completed, signed off  If discussed at [place] of Stay Meetings, dates discussed:    Additional Comments:  Carles Collet, RN 02/28/2016, 10:10 AM

## 2016-02-28 NOTE — Care Management Important Message (Signed)
Important Message  Patient Details  Name: [name] MRN: [id number] Date of Birth: 10-11-1934   Medicare Important Message Given:  Yes    [name] 02/28/2016, 9:51 AM

## 2016-02-28 NOTE — Progress Notes (Signed)
NURSING PROGRESS NOTE  SHAM CORAL [id number] Discharge Data: 02/28/2016 10:59 AM Attending Provider: [name], MD [id number], Raquel Sarna, MD     Boris Sharper to be D/C'd [place] per MD order.  Discussed with the patient the After Visit Summary and all questions fully answered. All IV's discontinued with no bleeding noted. All belongings returned to patient for patient to take to [facility]. Report given to [facility], receiving nurse. All questions answered. Pt will be transferred by stretcher through [facility]. Pt resting comfortably at this time. Family at bedside.   Last Vital Signs:  Blood pressure 101/72, pulse 84, temperature 97.5 F (36.4 C), temperature source Oral, resp. rate 18, height 5\' 6"  (1.676 m), weight 80.1 kg (176 lb 9.4 oz), SpO2 99 %.  Discharge Medication List   Medication List    STOP taking these medications   acetaminophen 325 MG tablet Commonly known as:  TYLENOL   amiodarone 200 MG tablet Commonly known as:  PACERONE   aspirin 81 MG EC tablet   atorvastatin 40 MG tablet Commonly known as:  LIPITOR   calcitRIOL 0.25 MCG capsule Commonly known as:  ROCALTROL   cetirizine 10 MG chewable tablet Commonly known as:  ZYRTEC   furosemide 20 MG tablet Commonly known as:  LASIX   guaiFENesin 600 MG 12 hr tablet Commonly known as:  MUCINEX   guaiFENesin-dextromethorphan 100-10 MG/5ML syrup Commonly known as:  ROBITUSSIN DM   hydrocortisone 2.5 % rectal cream Commonly known as:  ANUSOL-HC   levothyroxine 112 MCG tablet Commonly known as:  SYNTHROID, LEVOTHROID   lidocaine 4 % cream Commonly known as:  LMX   metoprolol tartrate 25 MG tablet Commonly known as:  LOPRESSOR   ondansetron 4 MG tablet Commonly known as:  ZOFRAN   pantoprazole 40 MG tablet Commonly known as:  PROTONIX   potassium chloride SA 20 MEQ tablet Commonly known as:  K-DUR,KLOR-CON   sennosides-docusate sodium 8.6-50 MG tablet Commonly known as:  SENOKOT-S    sodium bicarbonate 650 MG tablet   witch hazel-glycerin pad Commonly known as:  TUCKS        Charolette Child, RN

## 2016-02-28 NOTE — Telephone Encounter (Signed)
APT. REMINDER CALL, NO ANSWER, NO VOICEMAIL °

## 2016-02-28 NOTE — Progress Notes (Signed)
  Date: 02/28/2016  Patient name: [name]  Medical record number: [id number]  Date of birth: [birth year]   I personally saw and evaluated this patient and the plan of care was discussed with the house staff. Please see Dr. Rivka Safer note for complete details. I concur with her findings with the following additions/corrections:   Ms. Love had moderate malnutrition based on albumin of 2.5 on discharge and lack of PO intake.  She is being discharged to [facility] for further hospice care.    [name], MD 02/28/2016, 3:31 PM

## 2016-02-28 NOTE — Progress Notes (Signed)
Patient will DC to: [facility] Anticipated DC date: 02/28/16 Family notified: Daughter Transport by: Corey Harold   Per MD patient ready for DC to [facility]. RN, patient, patient's family, and facility notified of DC. Discharge Summary sent to facility. RN given number for report. DC packet on chart. Ambulance transport requested for patient.   CSW signing off.  Cedric Fishman, [place] Social Worker [phone]

## 2016-02-28 NOTE — Progress Notes (Signed)
Palliative Medicine RN Note: Follow up visit with pt; no family in room. Pt denies pain but c/o SOB and dry mouth. She has ice chips at her bedside, and she states this is the only thing that makes her mouth feel better. She continues to get Diflucan PO. Pt has fentanyl for pain/SOB; notified RN, who will bring a dose.   Teaching service arrived during my visit; discussed plan. Expect bed at [facility] today or referral to [facility] inpt if no bed comes available. [place] DNR on chart.  Marjie Skiff Vernestine Brodhead, RN, BSN, [facility] 02/28/2016 8:51 AM Cell [phone] 8:00-4:00 Monday-Friday Office [phone]

## 2016-02-28 NOTE — Consult Note (Signed)
[facility] Liaison: [facility] room available for patient today. Spoke with daughter by phone to confirm. Plan to meet with daughter when she arrives at hospital this afternoon. She has MD appointment around noon.   Please fax discharge summary to [phone].  RN please call report to [phone].  Thank you,  Erling Conte, LCSW [phone]

## 2016-02-28 NOTE — Progress Notes (Addendum)
Subjective: Patient continues to complain of SOB, but she denies pain. She seems more sedated today and is starting to fidget with her hands. She stated that she "feels nothing". She is alert and oriented x 3.   Objective:  Vital signs in last 24 hours: Vitals:   02/27/16 1347 02/27/16 2216 02/28/16 0523 02/28/16 0844  BP: (!) 75/48 99/72 119/70 101/72  Pulse: 94 (!) 110 91 84  Resp: _0 Temp: 97.5 F (36.4 C) 98.7 F (37.1 C) 97.5 F (36.4 C)   TempSrc: Oral  Oral   SpO2: 100% 92% 99%   Weight:      Height:       Physical Exam:. Constitutional: NAD HEENT: Atraumatic, normocephalic. PERRL, anicteric sclera. Thrush improved. Cardiovascular:irregular rhythm, regular rate, no murmurs, rubs, or gallops.  Pulmonary/Chest:Decreased breath sounds bilateral lower lobes, increase use of accessory muscles on 2L [place].  Abdominal:Soft, non tender, non distended. +BS. Extremities: Upper and lower extremities are cool to touch. Pulses intact. No edema. Bilateral toe amputations  Neurological:A&Ox3, CN II - XII grossly intact.    Assessment/Plan:  Goals of Care: Patient is here with end stage heart failure, CKD stage V (not a dialysis candidate), volume overload and hypoxia not responding well to lasix. This is her third hospitalization since August. She is not a candidate for more aggressive therapies and family has decided to pursue comfort care at this time. She was started on IV fentanyl 25 mcg q1 hours for air hunger and prn ativan for anxiety.  -- Palliative care consult today, appreciate recommendations   -- Discharge to [facility] today  Hypervolemia:In the setting of CHF (EF 20-25%) and CKD stage 5. CXR with pulmonary edema, pericardial effusion, and bilateral pleural effusions. Hypoxic to the low 80s. Responded poorly to IV lasix with worsening of her renal function.  -- Comfort care; no further diuresis at this time -- Continue supplemental oxygen for comfort --  Palliative following, appreciate recs   Oropharyngeal Candidiasis: Likely contributing to patient's poor PO intake over the past week.  -- Fluconazole 200 mg on admission followed by 100 mg daily (Day 5/7). Ritta Slot has resolved. We will discontinue fluconazole on discharge.   HCAP: Left lobe consolidation in the setting of recent hospitalization, however patient is afebrile without leukocytosis. Decreased breath sounds bilaterally on exam (L>R) due to pleural effusions. She endorses a non productive cough.  -- S/p IV cefepime x 1 in ED -- No further antibiotics   Supratherapeutic INR: Previously on warfarin for stroke prophylaxis in the setting of chronic a-fib. Warfarin discontinued during last hospitalization due to lability of INRs. Has not received warfarin in over a week and INR continues to be inappropriately high. Hepatic function panel with elevated AST 157, normal ALT 41, elevated Alk Phos 170, and T bili 1.3. Likely due to hepatic congestion.  -- Values have been unchanged despite Vit K; will discontinue INR checks  -- Hold home ASA 81  Fecal Incontinence: S/p kayexalate in ED. However daughter says this has been going on since September. Patient recently completed a 10 day course of IV ceftriaxone. C-diff antigen positive but toxin negative. Reflex PCR negative. No indication for treatment at this time. -- Imodium prn   CKD Stage V: Not a dialysis candidate. UA with small leukocytes, negative ntirites, rare bacteria, and granular casts. Patient has been anuric for the past two days. Bladder scan yesterday with 80 cc this morning.   -- Discontinue home calcimimetic and PO  bicarb  -- Avoid nephrotoxic meds  Chronic Combined CHF: LV EF of 20-25% during last hospitalization with severe biventricular dysfunction. -- No ACEI/ARB due to CKD -- Discontinue metoprolol 12.5 BID  Atrial Fibrillation: Chronic, rates have remained well controlled.  -- Cardiac monitoring discontinued  --  Discontinuemetoprolol 12.5 mg BID -- Discontinue Amiodarone 200 mg BID  Hypothyroidism: TSH elevated 39 and free T4 normal at recent office visit. Likely due to acute illness.  -- Continue home synthroid 112 mcg daily   Hyperkalemia: S/p 10 units of novolog + D50 yesterday.  -- stopped all lab draws   HLD: -- Discontinue home Lipitor 40 mg  Hemorrhoids:  -- Hydrocortisone rectal cream BID prn  GERD: -- Discontinue Protonix 40 mg daily   FEN: Comfort care VTE ppx: SCDs Code Status: DNR   Dispo: Anticipated discharge today  Carolyn Guilloud, MD 02/28/2016, 10:31 AM Pager: [phone] 

## 2016-02-29 ENCOUNTER — Ambulatory Visit: Payer: Self-pay

## 2016-02-29 ENCOUNTER — Encounter: Payer: Self-pay | Admitting: Internal Medicine

## 2016-03-05 NOTE — Telephone Encounter (Signed)
No answer

## 2016-03-19 DEATH — deceased

## 2016-04-07 ENCOUNTER — Telehealth: Payer: Self-pay | Admitting: Neurology

## 2016-04-07 NOTE — Telephone Encounter (Signed)
PT IS DECEASED PER NOTE IN FYI's DATED 02/17/2016 CB

## 2016-04-07 NOTE — Telephone Encounter (Signed)
Thanks

## 2016-07-04 ENCOUNTER — Ambulatory Visit: Payer: Self-pay | Admitting: Nurse Practitioner

## 2016-09-23 IMAGING — CR DG ABDOMEN ACUTE W/ 1V CHEST
4 series · 4 of 4 positions shown · non-contrast
Comparison: December 12, 2014

CLINICAL DATA: Rectal pain for 1 day.

EXAM:
DG ABDOMEN ACUTE W/ 1V CHEST

[x chest ap]
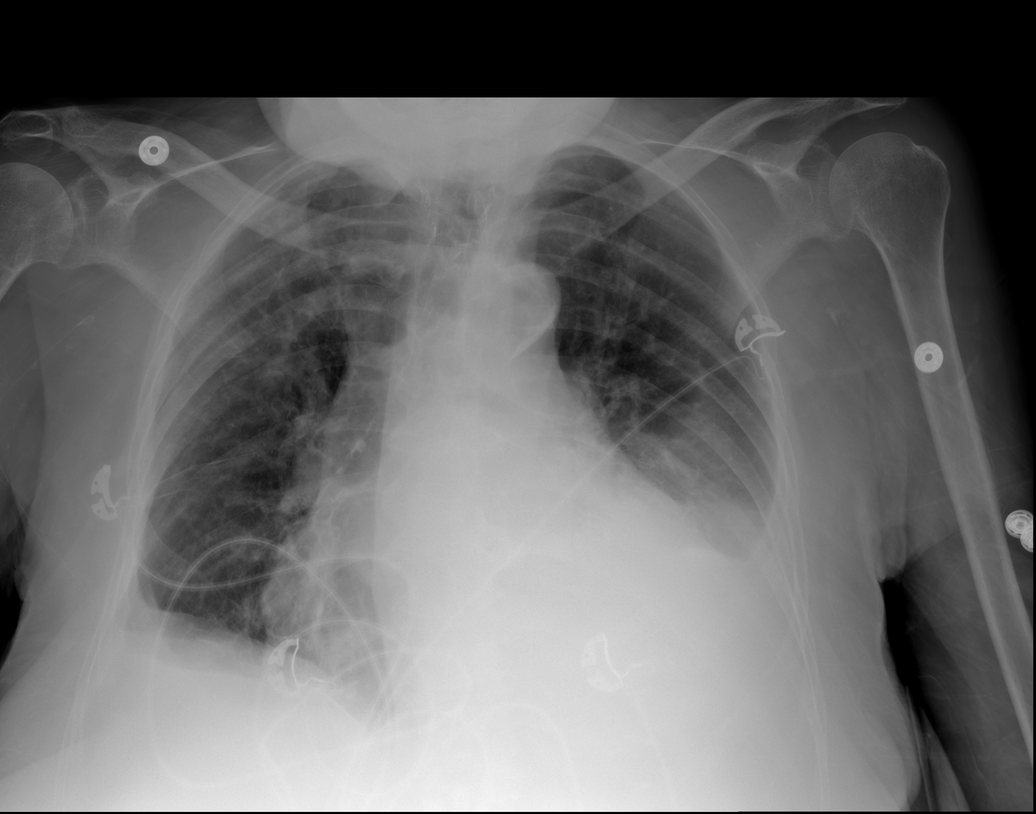

[w abdomen decub]
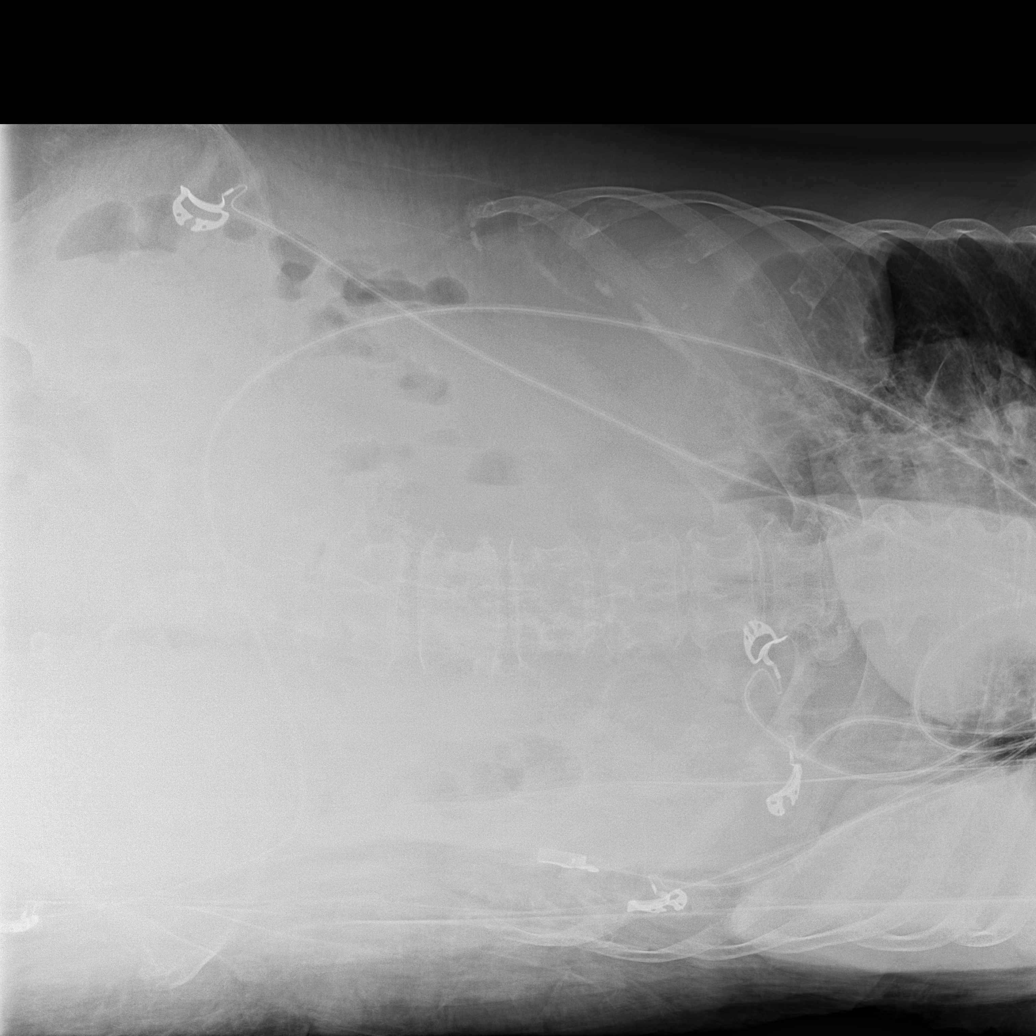

[x abdomen supine (1 of 2)]
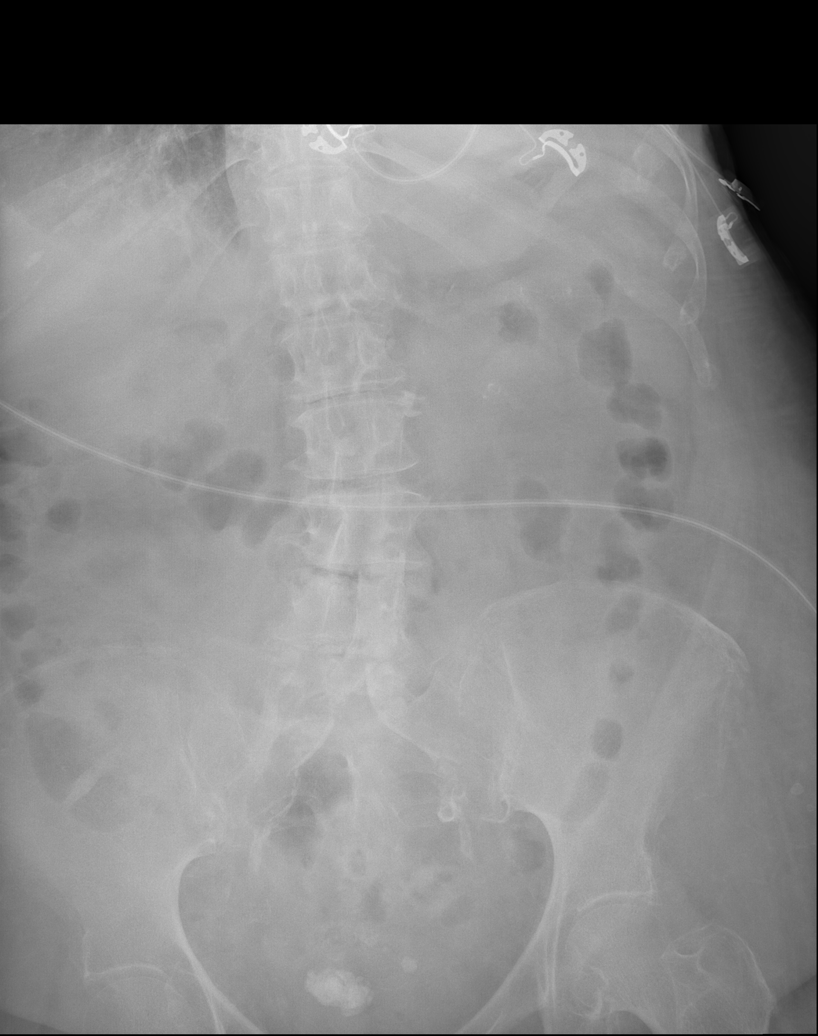

[x abdomen supine (2 of 2)]
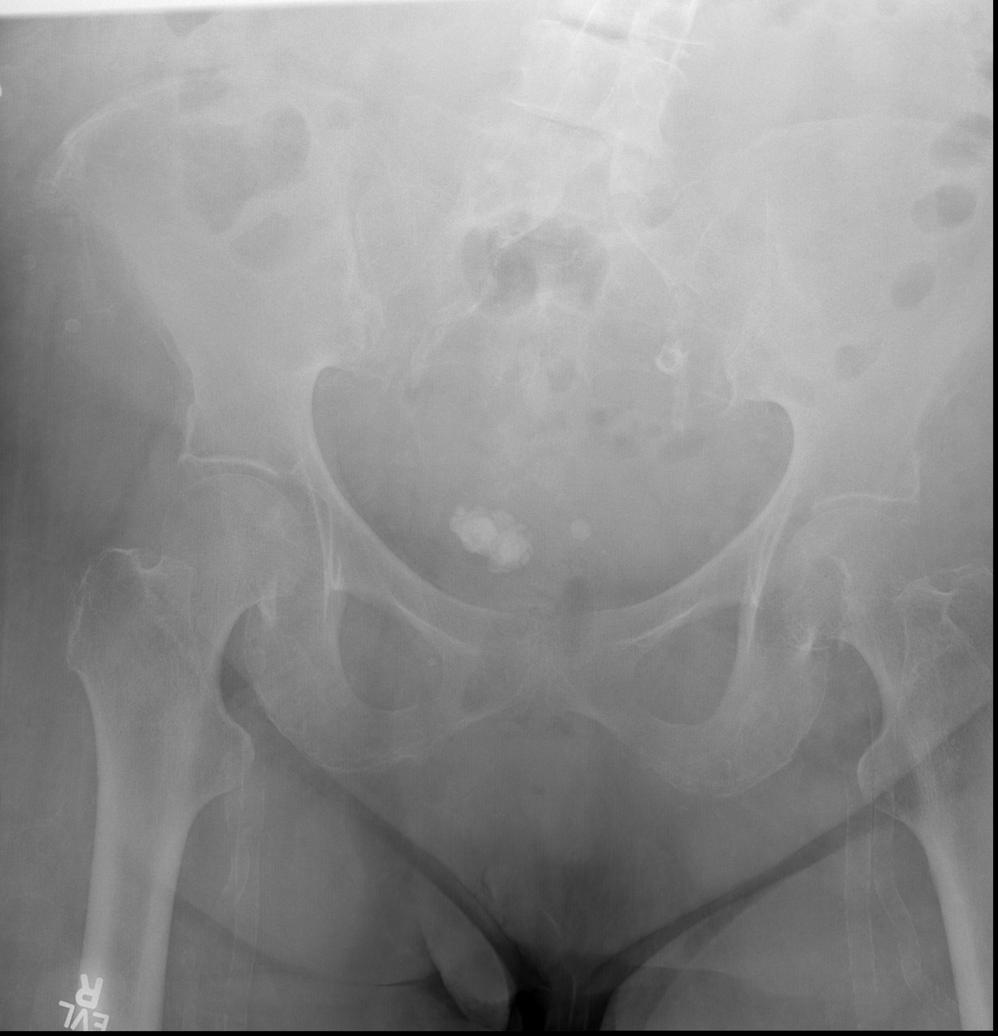

[4 of 4 positions shown; findings below may reference images not displayed]

FINDINGS: There is no evidence of dilated bowel loops or free intraperitoneal
air. No radiopaque calculi or other significant radiographic
abnormality is seen. Degenerative joint changes of the spine and
scoliosis of the spine are identified. Images of the lungs
demonstrate patchy consolidation of left lung base. There bilateral
pleural effusions. There is increased central pulmonary vessel
caliber. The heart size is enlarged.
IMPRESSION: No bowel obstruction or free air.

Bilateral pleural effusions. Patchy consolidation of left lung base,
underlying pneumonia is not excluded. Central pulmonary vascular
congestion with cardiomegaly.

## 2016-11-02 IMAGING — CR DG CHEST 2V
2 series · 2 of 2 positions shown · non-contrast
Comparison: Two days ago

CLINICAL DATA: Cough and hypoxia

EXAM:
CHEST  2 VIEW

[chest lat]
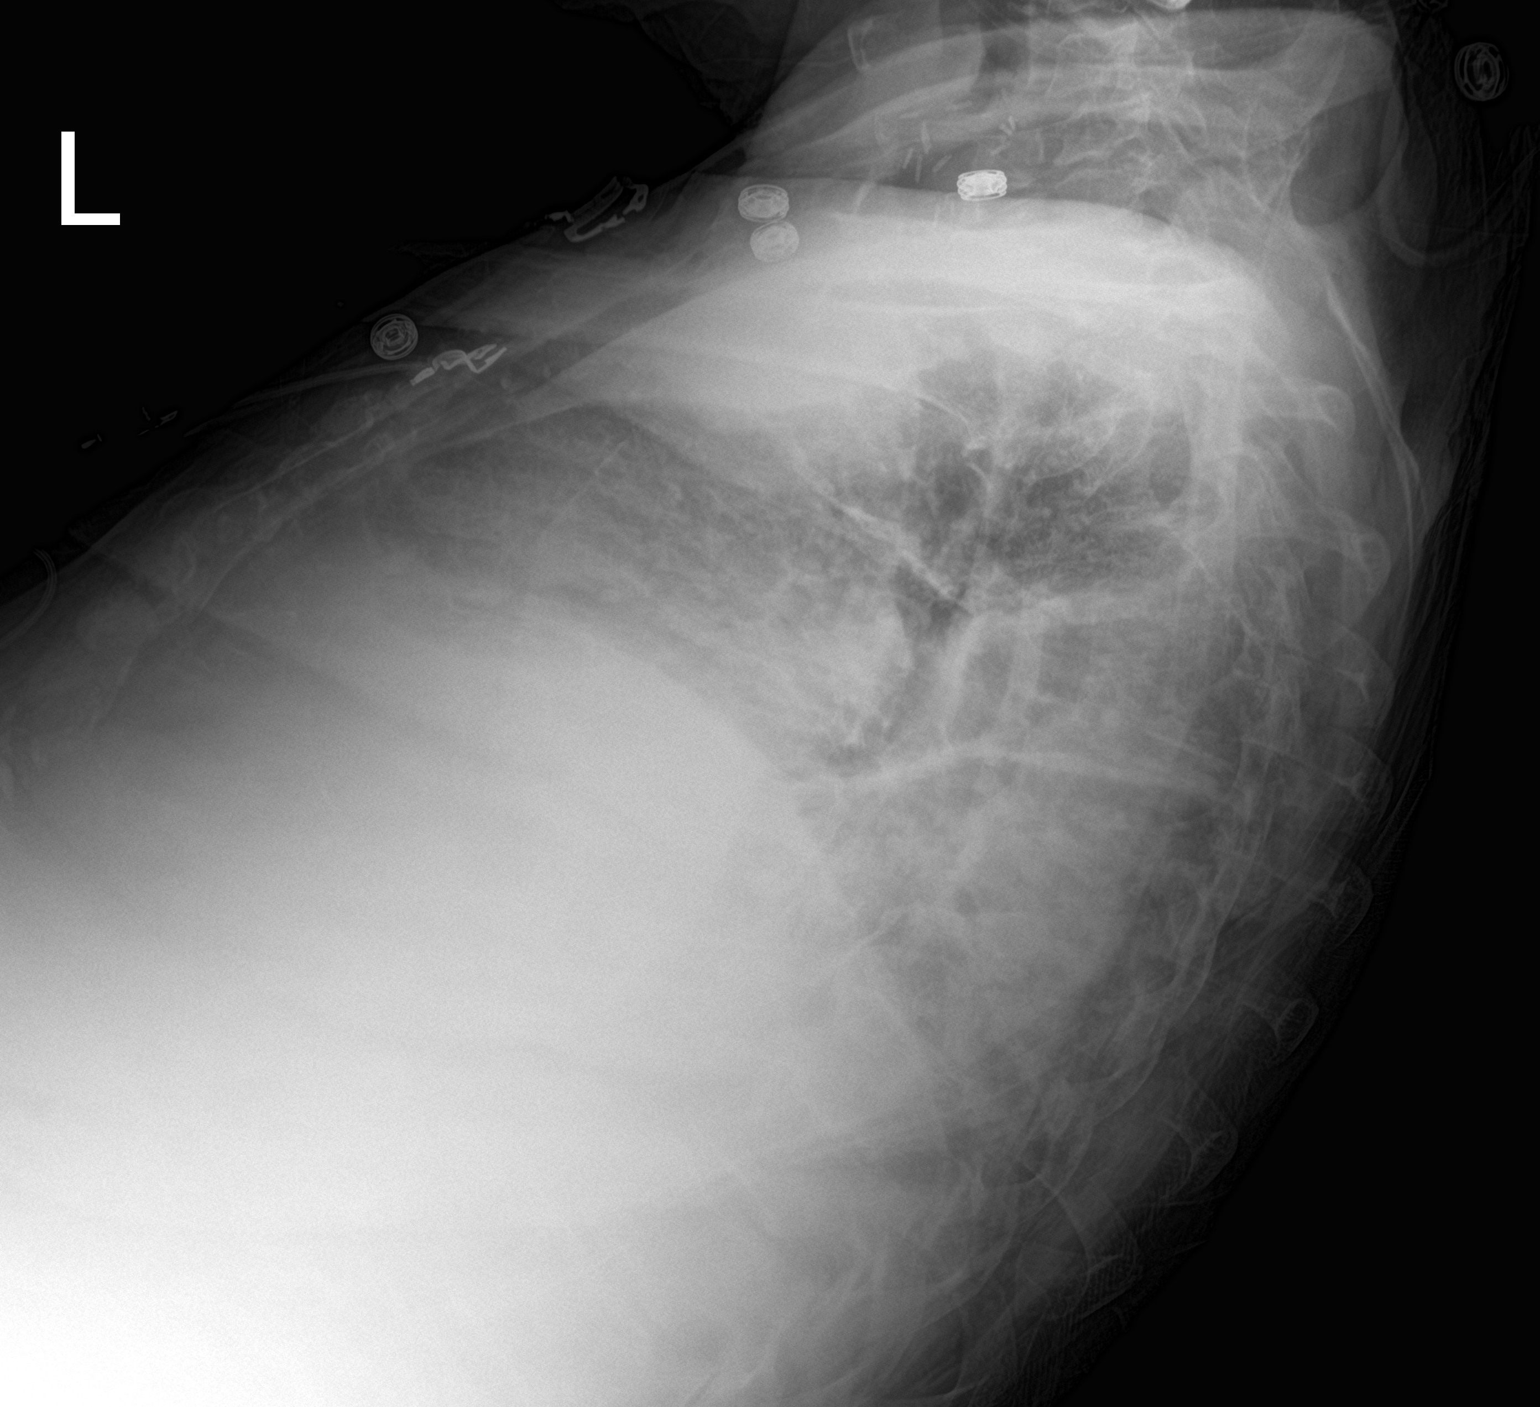

[chest ap]
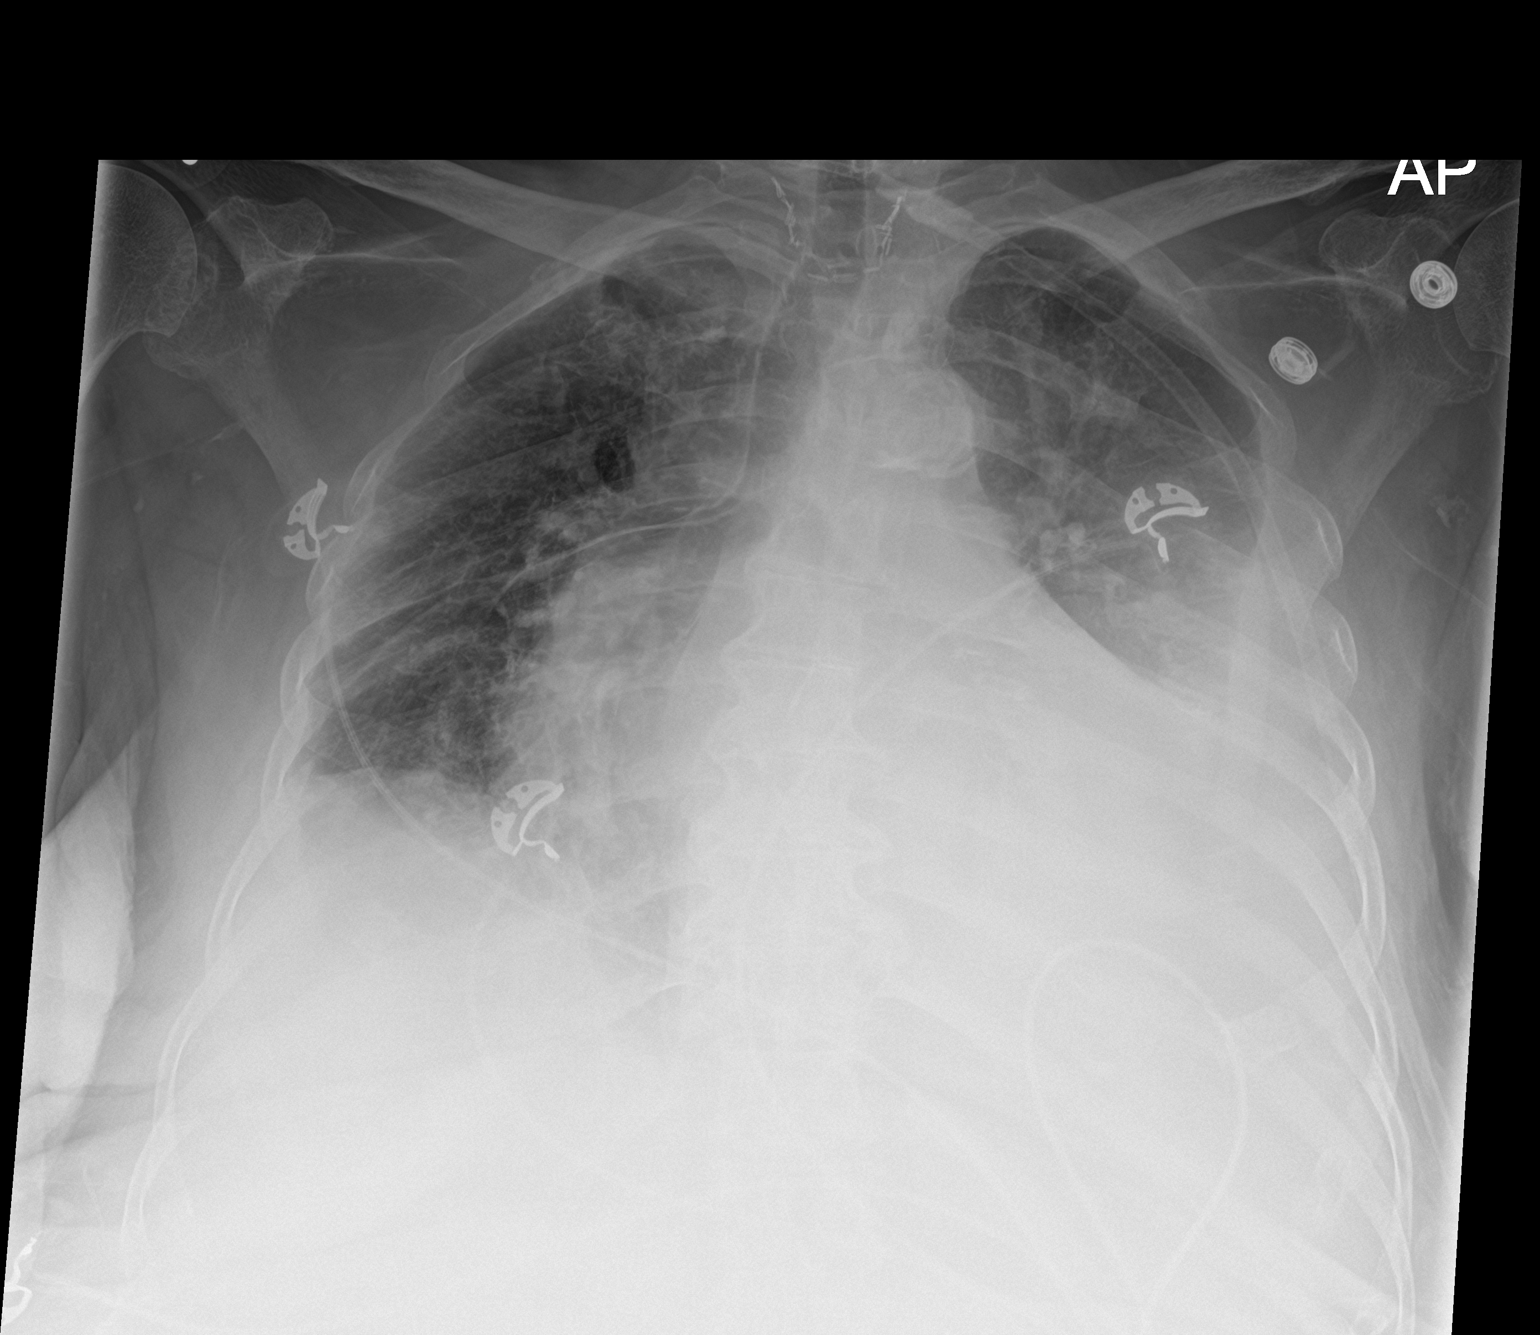

[2 of 2 positions shown; findings below may reference images not displayed]

FINDINGS: Cardiopericardial enlargement, diffuse interstitial opacity, and
moderate to large layering left pleural effusion. Lung volumes are
low and there is atelectatic change seen laterally. No evidence of
pneumothorax.

Postoperative thoracic inlet.
IMPRESSION: 1. Unchanged from 2 days prior.
2. Cardiopericardial enlargement with pericardial effusion seen by
CT 02/13/2016.
[DATE]. Atelectasis or consolidation at the left base.
4. Possible mild pulmonary edema.
